# Patient Record
Sex: Female | Born: 1950 | Race: White | Hispanic: No | Marital: Married | Smoking: Never smoker
Health system: Southern US, Community
[De-identification: ages and names within clinical notes are randomized; demographics above are authoritative.]

## PROBLEM LIST (undated history)

## (undated) DIAGNOSIS — I10 Essential (primary) hypertension: Secondary | ICD-10-CM

## (undated) DIAGNOSIS — F329 Major depressive disorder, single episode, unspecified: Secondary | ICD-10-CM

## (undated) DIAGNOSIS — C50919 Malignant neoplasm of unspecified site of unspecified female breast: Secondary | ICD-10-CM

## (undated) DIAGNOSIS — K219 Gastro-esophageal reflux disease without esophagitis: Secondary | ICD-10-CM

## (undated) DIAGNOSIS — K8020 Calculus of gallbladder without cholecystitis without obstruction: Secondary | ICD-10-CM

## (undated) DIAGNOSIS — T7840XA Allergy, unspecified, initial encounter: Secondary | ICD-10-CM

## (undated) DIAGNOSIS — J189 Pneumonia, unspecified organism: Secondary | ICD-10-CM

## (undated) DIAGNOSIS — H269 Unspecified cataract: Secondary | ICD-10-CM

## (undated) DIAGNOSIS — N2889 Other specified disorders of kidney and ureter: Secondary | ICD-10-CM

## (undated) DIAGNOSIS — Z9081 Acquired absence of spleen: Secondary | ICD-10-CM

## (undated) DIAGNOSIS — M1990 Unspecified osteoarthritis, unspecified site: Secondary | ICD-10-CM

## (undated) DIAGNOSIS — Z87442 Personal history of urinary calculi: Secondary | ICD-10-CM

## (undated) DIAGNOSIS — I447 Left bundle-branch block, unspecified: Secondary | ICD-10-CM

## (undated) DIAGNOSIS — R7989 Other specified abnormal findings of blood chemistry: Secondary | ICD-10-CM

## (undated) DIAGNOSIS — F32A Depression, unspecified: Secondary | ICD-10-CM

## (undated) DIAGNOSIS — F419 Anxiety disorder, unspecified: Secondary | ICD-10-CM

## (undated) DIAGNOSIS — N200 Calculus of kidney: Secondary | ICD-10-CM

## (undated) DIAGNOSIS — R079 Chest pain, unspecified: Secondary | ICD-10-CM

## (undated) DIAGNOSIS — E785 Hyperlipidemia, unspecified: Secondary | ICD-10-CM

## (undated) DIAGNOSIS — I509 Heart failure, unspecified: Secondary | ICD-10-CM

## (undated) DIAGNOSIS — E119 Type 2 diabetes mellitus without complications: Secondary | ICD-10-CM

## (undated) HISTORY — DX: Acquired absence of spleen: Z90.81

## (undated) HISTORY — DX: Unspecified osteoarthritis, unspecified site: M19.90

## (undated) HISTORY — DX: Depression, unspecified: F32.A

## (undated) HISTORY — PX: ESOPHAGOGASTRODUODENOSCOPY: SHX1529

## (undated) HISTORY — DX: Essential (primary) hypertension: I10

## (undated) HISTORY — DX: Calculus of kidney: N20.0

## (undated) HISTORY — PX: TUBAL LIGATION: SHX77

## (undated) HISTORY — DX: Pneumonia, unspecified organism: J18.9

## (undated) HISTORY — DX: Hyperlipidemia, unspecified: E78.5

## (undated) HISTORY — PX: PANCREATIC CYST EXCISION: SHX2157

## (undated) HISTORY — DX: Other specified disorders of kidney and ureter: N28.89

## (undated) HISTORY — DX: Major depressive disorder, single episode, unspecified: F32.9

## (undated) HISTORY — DX: Left bundle-branch block, unspecified: I44.7

## (undated) HISTORY — DX: Chest pain, unspecified: R07.9

## (undated) HISTORY — DX: Unspecified cataract: H26.9

## (undated) HISTORY — DX: Type 2 diabetes mellitus without complications: E11.9

## (undated) HISTORY — DX: Malignant neoplasm of unspecified site of unspecified female breast: C50.919

## (undated) HISTORY — DX: Gastro-esophageal reflux disease without esophagitis: K21.9

## (undated) HISTORY — DX: Other specified abnormal findings of blood chemistry: R79.89

## (undated) HISTORY — PX: FRACTURE SURGERY: SHX138

## (undated) HISTORY — PX: SPLENECTOMY: SUR1306

## (undated) HISTORY — PX: APPENDECTOMY: SHX54

## (undated) HISTORY — PX: TONSILLECTOMY AND ADENOIDECTOMY: SUR1326

## (undated) HISTORY — PX: LIVER BIOPSY: SHX301

## (undated) HISTORY — PX: CHOLECYSTECTOMY: SHX55

## (undated) HISTORY — DX: Allergy, unspecified, initial encounter: T78.40XA

## (undated) HISTORY — DX: Calculus of gallbladder without cholecystitis without obstruction: K80.20

## (undated) HISTORY — PX: HERNIA REPAIR: SHX51

---

## 1998-01-24 ENCOUNTER — Other Ambulatory Visit: Admission: RE | Admit: 1998-01-24 | Discharge: 1998-01-24 | Payer: Self-pay | Admitting: *Deleted

## 1999-02-02 ENCOUNTER — Other Ambulatory Visit: Admission: RE | Admit: 1999-02-02 | Discharge: 1999-02-02 | Payer: Self-pay | Admitting: *Deleted

## 2000-01-02 ENCOUNTER — Other Ambulatory Visit: Admission: RE | Admit: 2000-01-02 | Discharge: 2000-01-02 | Payer: Self-pay | Admitting: *Deleted

## 2002-03-02 ENCOUNTER — Other Ambulatory Visit: Admission: RE | Admit: 2002-03-02 | Discharge: 2002-03-02 | Payer: Self-pay | Admitting: Family Medicine

## 2002-03-09 ENCOUNTER — Encounter: Payer: Self-pay | Admitting: Family Medicine

## 2002-03-09 ENCOUNTER — Ambulatory Visit (HOSPITAL_COMMUNITY): Admission: RE | Admit: 2002-03-09 | Discharge: 2002-03-09 | Payer: Self-pay | Admitting: Family Medicine

## 2002-03-23 ENCOUNTER — Ambulatory Visit (HOSPITAL_COMMUNITY): Admission: RE | Admit: 2002-03-23 | Discharge: 2002-03-23 | Payer: Self-pay | Admitting: Gastroenterology

## 2002-03-30 ENCOUNTER — Encounter: Admission: RE | Admit: 2002-03-30 | Discharge: 2002-03-30 | Payer: Self-pay | Admitting: Family Medicine

## 2002-03-30 ENCOUNTER — Encounter: Payer: Self-pay | Admitting: Family Medicine

## 2002-04-13 ENCOUNTER — Encounter: Payer: Self-pay | Admitting: Gastroenterology

## 2002-04-13 ENCOUNTER — Ambulatory Visit (HOSPITAL_COMMUNITY): Admission: RE | Admit: 2002-04-13 | Discharge: 2002-04-13 | Payer: Self-pay | Admitting: Gastroenterology

## 2003-07-27 ENCOUNTER — Other Ambulatory Visit: Admission: RE | Admit: 2003-07-27 | Discharge: 2003-07-27 | Payer: Self-pay | Admitting: Family Medicine

## 2004-01-18 ENCOUNTER — Encounter: Admission: RE | Admit: 2004-01-18 | Discharge: 2004-01-18 | Payer: Self-pay | Admitting: Internal Medicine

## 2004-07-18 ENCOUNTER — Other Ambulatory Visit: Admission: RE | Admit: 2004-07-18 | Discharge: 2004-07-18 | Payer: Self-pay | Admitting: Obstetrics & Gynecology

## 2004-07-25 ENCOUNTER — Ambulatory Visit: Payer: Self-pay | Admitting: Family Medicine

## 2004-12-02 ENCOUNTER — Emergency Department (HOSPITAL_COMMUNITY): Admission: EM | Admit: 2004-12-02 | Discharge: 2004-12-02 | Payer: Self-pay | Admitting: Emergency Medicine

## 2004-12-07 ENCOUNTER — Ambulatory Visit: Payer: Self-pay | Admitting: Family Medicine

## 2004-12-15 ENCOUNTER — Ambulatory Visit: Payer: Self-pay | Admitting: Family Medicine

## 2004-12-21 ENCOUNTER — Ambulatory Visit: Payer: Self-pay

## 2005-01-01 ENCOUNTER — Ambulatory Visit: Payer: Self-pay | Admitting: Family Medicine

## 2005-01-05 ENCOUNTER — Ambulatory Visit: Payer: Self-pay | Admitting: Cardiology

## 2005-04-13 ENCOUNTER — Ambulatory Visit: Payer: Self-pay | Admitting: Family Medicine

## 2005-04-25 DIAGNOSIS — K8020 Calculus of gallbladder without cholecystitis without obstruction: Secondary | ICD-10-CM

## 2005-04-25 HISTORY — DX: Calculus of gallbladder without cholecystitis without obstruction: K80.20

## 2005-05-20 ENCOUNTER — Ambulatory Visit: Payer: Self-pay | Admitting: Internal Medicine

## 2005-05-21 ENCOUNTER — Inpatient Hospital Stay (HOSPITAL_COMMUNITY): Admission: EM | Admit: 2005-05-21 | Discharge: 2005-05-24 | Payer: Self-pay | Admitting: Emergency Medicine

## 2005-05-21 ENCOUNTER — Ambulatory Visit: Payer: Self-pay | Admitting: Gastroenterology

## 2005-05-23 ENCOUNTER — Encounter (INDEPENDENT_AMBULATORY_CARE_PROVIDER_SITE_OTHER): Payer: Self-pay | Admitting: Specialist

## 2005-06-06 ENCOUNTER — Ambulatory Visit: Payer: Self-pay | Admitting: Family Medicine

## 2005-07-05 ENCOUNTER — Ambulatory Visit: Payer: Self-pay | Admitting: Gastroenterology

## 2005-11-15 ENCOUNTER — Ambulatory Visit: Payer: Self-pay | Admitting: Family Medicine

## 2005-11-23 HISTORY — PX: KNEE ARTHROSCOPY: SUR90

## 2005-11-28 ENCOUNTER — Encounter: Admission: RE | Admit: 2005-11-28 | Discharge: 2005-11-28 | Payer: Self-pay | Admitting: Family Medicine

## 2005-12-06 ENCOUNTER — Ambulatory Visit: Payer: Self-pay | Admitting: Family Medicine

## 2006-01-31 ENCOUNTER — Ambulatory Visit: Payer: Self-pay | Admitting: Family Medicine

## 2006-07-04 ENCOUNTER — Ambulatory Visit: Payer: Self-pay | Admitting: Family Medicine

## 2006-07-17 ENCOUNTER — Ambulatory Visit: Payer: Self-pay | Admitting: Family Medicine

## 2006-07-17 LAB — CONVERTED CEMR LAB
ALT: 23 units/L (ref 0–40)
Albumin: 3.8 g/dL (ref 3.5–5.2)
Alkaline Phosphatase: 80 units/L (ref 39–117)
Creatinine,U: 148.2 mg/dL
GFR calc Af Amer: 83 mL/min
GFR calc non Af Amer: 69 mL/min
Hgb A1c MFr Bld: 6.6 % — ABNORMAL HIGH (ref 4.6–6.0)
Microalb Creat Ratio: 8.8 mg/g (ref 0.0–30.0)
Microalb, Ur: 1.3 mg/dL (ref 0.0–1.9)
Potassium: 3.9 meq/L (ref 3.5–5.1)
Sodium: 142 meq/L (ref 135–145)
Total Protein: 6.9 g/dL (ref 6.0–8.3)

## 2006-10-14 ENCOUNTER — Ambulatory Visit: Payer: Self-pay | Admitting: Family Medicine

## 2006-10-14 LAB — CONVERTED CEMR LAB
AST: 20 units/L (ref 0–37)
Bilirubin, Direct: 0.1 mg/dL (ref 0.0–0.3)
CO2: 26 meq/L (ref 19–32)
Calcium: 8.8 mg/dL (ref 8.4–10.5)
Creatinine, Ser: 0.7 mg/dL (ref 0.4–1.2)
Hgb A1c MFr Bld: 6.5 % — ABNORMAL HIGH (ref 4.6–6.0)
Sodium: 144 meq/L (ref 135–145)
Total Bilirubin: 0.6 mg/dL (ref 0.3–1.2)
Total Protein: 6.3 g/dL (ref 6.0–8.3)

## 2006-11-04 ENCOUNTER — Ambulatory Visit: Payer: Self-pay | Admitting: Family Medicine

## 2007-01-16 ENCOUNTER — Ambulatory Visit: Payer: Self-pay | Admitting: Family Medicine

## 2007-01-17 LAB — CONVERTED CEMR LAB
ALT: 26 units/L (ref 0–35)
Alkaline Phosphatase: 96 units/L (ref 39–117)
BUN: 16 mg/dL (ref 6–23)
Bilirubin, Direct: 0.1 mg/dL (ref 0.0–0.3)
CO2: 27 meq/L (ref 19–32)
Calcium: 9.4 mg/dL (ref 8.4–10.5)
Creatinine, Ser: 0.8 mg/dL (ref 0.4–1.2)
Creatinine,U: 158.6 mg/dL
GFR calc non Af Amer: 79 mL/min
Glucose, Bld: 134 mg/dL — ABNORMAL HIGH (ref 70–99)
Potassium: 3.6 meq/L (ref 3.5–5.1)
Total Protein: 6.8 g/dL (ref 6.0–8.3)

## 2007-01-20 ENCOUNTER — Encounter: Payer: Self-pay | Admitting: Family Medicine

## 2007-01-26 ENCOUNTER — Encounter: Payer: Self-pay | Admitting: Family Medicine

## 2007-05-02 ENCOUNTER — Telehealth (INDEPENDENT_AMBULATORY_CARE_PROVIDER_SITE_OTHER): Payer: Self-pay | Admitting: *Deleted

## 2007-05-07 ENCOUNTER — Ambulatory Visit: Payer: Self-pay | Admitting: Family Medicine

## 2007-05-08 LAB — CONVERTED CEMR LAB
ALT: 22 units/L (ref 0–35)
AST: 19 units/L (ref 0–37)
Albumin: 3.7 g/dL (ref 3.5–5.2)
Alkaline Phosphatase: 85 units/L (ref 39–117)
Bilirubin, Direct: 0.1 mg/dL (ref 0.0–0.3)
Total Bilirubin: 0.6 mg/dL (ref 0.3–1.2)
Total Protein: 7 g/dL (ref 6.0–8.3)

## 2007-06-21 ENCOUNTER — Encounter: Payer: Self-pay | Admitting: Family Medicine

## 2007-07-21 ENCOUNTER — Ambulatory Visit: Payer: Self-pay | Admitting: Family Medicine

## 2007-07-29 LAB — CONVERTED CEMR LAB
CO2: 28 meq/L (ref 19–32)
Calcium: 9.3 mg/dL (ref 8.4–10.5)
Chloride: 107 meq/L (ref 96–112)
Creatinine, Ser: 0.8 mg/dL (ref 0.4–1.2)
GFR calc non Af Amer: 79 mL/min
Hgb A1c MFr Bld: 6.8 % — ABNORMAL HIGH (ref 4.6–6.0)
Potassium: 3.9 meq/L (ref 3.5–5.1)
Sodium: 143 meq/L (ref 135–145)

## 2007-09-04 ENCOUNTER — Ambulatory Visit: Payer: Self-pay | Admitting: Family Medicine

## 2007-09-04 DIAGNOSIS — R209 Unspecified disturbances of skin sensation: Secondary | ICD-10-CM | POA: Insufficient documentation

## 2007-09-04 DIAGNOSIS — F329 Major depressive disorder, single episode, unspecified: Secondary | ICD-10-CM | POA: Insufficient documentation

## 2007-09-04 DIAGNOSIS — K219 Gastro-esophageal reflux disease without esophagitis: Secondary | ICD-10-CM | POA: Insufficient documentation

## 2007-09-04 DIAGNOSIS — Z9189 Other specified personal risk factors, not elsewhere classified: Secondary | ICD-10-CM | POA: Insufficient documentation

## 2007-09-04 DIAGNOSIS — E785 Hyperlipidemia, unspecified: Secondary | ICD-10-CM | POA: Insufficient documentation

## 2007-09-08 ENCOUNTER — Encounter (INDEPENDENT_AMBULATORY_CARE_PROVIDER_SITE_OTHER): Payer: Self-pay | Admitting: *Deleted

## 2007-09-10 ENCOUNTER — Telehealth (INDEPENDENT_AMBULATORY_CARE_PROVIDER_SITE_OTHER): Payer: Self-pay | Admitting: *Deleted

## 2007-09-11 ENCOUNTER — Encounter: Payer: Self-pay | Admitting: Family Medicine

## 2007-10-23 ENCOUNTER — Encounter: Admission: RE | Admit: 2007-10-23 | Discharge: 2007-10-23 | Payer: Self-pay | Admitting: *Deleted

## 2008-04-02 ENCOUNTER — Encounter: Payer: Self-pay | Admitting: Internal Medicine

## 2008-05-26 ENCOUNTER — Ambulatory Visit: Payer: Self-pay | Admitting: Internal Medicine

## 2008-06-23 ENCOUNTER — Emergency Department (HOSPITAL_COMMUNITY): Admission: EM | Admit: 2008-06-23 | Discharge: 2008-06-23 | Payer: Self-pay | Admitting: Emergency Medicine

## 2008-06-30 ENCOUNTER — Encounter: Payer: Self-pay | Admitting: Internal Medicine

## 2008-07-12 ENCOUNTER — Ambulatory Visit: Payer: Self-pay | Admitting: Internal Medicine

## 2008-07-12 DIAGNOSIS — IMO0002 Reserved for concepts with insufficient information to code with codable children: Secondary | ICD-10-CM

## 2008-07-12 DIAGNOSIS — M5416 Radiculopathy, lumbar region: Secondary | ICD-10-CM | POA: Insufficient documentation

## 2008-07-12 LAB — CONVERTED CEMR LAB
Alkaline Phosphatase: 81 units/L (ref 39–117)
BUN: 15 mg/dL (ref 6–23)
Basophils Absolute: 0.1 10*3/uL (ref 0.0–0.1)
Bilirubin, Direct: 0.1 mg/dL (ref 0.0–0.3)
Calcium: 9.3 mg/dL (ref 8.4–10.5)
Cholesterol: 89 mg/dL (ref 0–200)
Creatinine,U: 140 mg/dL
Crystals: NEGATIVE
Eosinophils Absolute: 0.2 10*3/uL (ref 0.0–0.7)
Eosinophils Relative: 1.6 % (ref 0.0–5.0)
GFR calc Af Amer: 111 mL/min
GFR calc non Af Amer: 91 mL/min
Hemoglobin: 12.4 g/dL (ref 12.0–15.0)
Leukocytes, UA: NEGATIVE
MCHC: 33.7 g/dL (ref 30.0–36.0)
Neutrophils Relative %: 58.1 % (ref 43.0–77.0)
Platelets: 239 10*3/uL (ref 150–400)
RBC: 4.28 M/uL (ref 3.87–5.11)
RDW: 12.3 % (ref 11.5–14.6)
TSH: 1.43 microintl units/mL (ref 0.35–5.50)
Total Bilirubin: 0.7 mg/dL (ref 0.3–1.2)
Total CHOL/HDL Ratio: 2.5
Total Protein, Urine: NEGATIVE mg/dL
Triglycerides: 63 mg/dL (ref 0–149)
Vitamin B-12: 417 pg/mL (ref 211–911)
pH: 6.5 (ref 5.0–8.0)

## 2008-07-13 ENCOUNTER — Encounter: Payer: Self-pay | Admitting: Internal Medicine

## 2008-07-16 ENCOUNTER — Encounter: Admission: RE | Admit: 2008-07-16 | Discharge: 2008-07-16 | Payer: Self-pay | Admitting: Internal Medicine

## 2008-07-18 ENCOUNTER — Encounter: Payer: Self-pay | Admitting: Internal Medicine

## 2008-07-28 ENCOUNTER — Encounter: Payer: Self-pay | Admitting: Internal Medicine

## 2008-08-04 ENCOUNTER — Encounter: Admission: RE | Admit: 2008-08-04 | Discharge: 2008-08-04 | Payer: Self-pay | Admitting: Urology

## 2008-08-12 ENCOUNTER — Telehealth: Payer: Self-pay | Admitting: Internal Medicine

## 2008-08-18 ENCOUNTER — Encounter: Payer: Self-pay | Admitting: Internal Medicine

## 2008-08-24 ENCOUNTER — Encounter: Payer: Self-pay | Admitting: Internal Medicine

## 2008-08-31 ENCOUNTER — Encounter: Payer: Self-pay | Admitting: Internal Medicine

## 2008-09-03 ENCOUNTER — Encounter: Payer: Self-pay | Admitting: Internal Medicine

## 2008-09-10 ENCOUNTER — Encounter: Payer: Self-pay | Admitting: Internal Medicine

## 2008-09-15 ENCOUNTER — Ambulatory Visit: Payer: Self-pay | Admitting: Internal Medicine

## 2008-09-18 ENCOUNTER — Telehealth (INDEPENDENT_AMBULATORY_CARE_PROVIDER_SITE_OTHER): Payer: Self-pay | Admitting: *Deleted

## 2008-09-20 ENCOUNTER — Telehealth: Payer: Self-pay | Admitting: Internal Medicine

## 2008-10-18 ENCOUNTER — Ambulatory Visit: Payer: Self-pay | Admitting: Internal Medicine

## 2008-10-19 ENCOUNTER — Encounter: Payer: Self-pay | Admitting: Internal Medicine

## 2008-10-20 ENCOUNTER — Ambulatory Visit: Payer: Self-pay | Admitting: Internal Medicine

## 2008-11-15 ENCOUNTER — Ambulatory Visit: Payer: Self-pay | Admitting: Internal Medicine

## 2008-11-15 LAB — CONVERTED CEMR LAB
AST: 31 units/L (ref 0–37)
Albumin: 4.4 g/dL (ref 3.5–5.2)
BUN: 11 mg/dL (ref 6–23)
Basophils Relative: 0.4 % (ref 0.0–3.0)
Bilirubin, Direct: 0.1 mg/dL (ref 0.0–0.3)
Calcium: 9.9 mg/dL (ref 8.4–10.5)
Cholesterol: 97 mg/dL (ref 0–200)
Eosinophils Absolute: 0.5 10*3/uL (ref 0.0–0.7)
Eosinophils Relative: 3.4 % (ref 0.0–5.0)
GFR calc non Af Amer: 91.2 mL/min (ref >60)
HCT: 36.8 % (ref 36.0–46.0)
Iron: 49 ug/dL (ref 42–145)
Ketones, ur: NEGATIVE mg/dL
LDL Cholesterol: 37 mg/dL (ref 0–99)
MCHC: 33.9 g/dL (ref 30.0–36.0)
Monocytes Absolute: 1.2 10*3/uL — ABNORMAL HIGH (ref 0.1–1.0)
Monocytes Relative: 8.7 % (ref 3.0–12.0)
Neutro Abs: 7.2 10*3/uL (ref 1.4–7.7)
Neutrophils Relative %: 50.7 % (ref 43.0–77.0)
Platelets: 437 10*3/uL — ABNORMAL HIGH (ref 150.0–400.0)
Total CK: 28 units/L (ref 7–177)
Total Protein: 7.4 g/dL (ref 6.0–8.3)
Triglycerides: 126 mg/dL (ref 0.0–149.0)

## 2008-11-16 ENCOUNTER — Encounter: Payer: Self-pay | Admitting: Internal Medicine

## 2008-12-20 ENCOUNTER — Ambulatory Visit: Payer: Self-pay | Admitting: Internal Medicine

## 2008-12-20 DIAGNOSIS — D72829 Elevated white blood cell count, unspecified: Secondary | ICD-10-CM

## 2008-12-20 LAB — CONVERTED CEMR LAB
Alpha-1-Globulin: 4.2 % (ref 2.9–4.9)
Alpha-2-Globulin: 12.1 % — ABNORMAL HIGH (ref 7.1–11.8)
Basophils Relative: 0.6 % (ref 0.0–3.0)
Beta Globulin: 7.3 % — ABNORMAL HIGH (ref 4.7–7.2)
Eosinophils Relative: 3.3 % (ref 0.0–5.0)
Gamma Globulin: 11.7 % (ref 11.1–18.8)
HCT: 38 % (ref 36.0–46.0)
Hemoglobin: 12.5 g/dL (ref 12.0–15.0)
Lymphs Abs: 4.5 10*3/uL — ABNORMAL HIGH (ref 0.7–4.0)
MCHC: 33 g/dL (ref 30.0–36.0)
Monocytes Relative: 10.4 % (ref 3.0–12.0)
Neutro Abs: 8.5 10*3/uL — ABNORMAL HIGH (ref 1.4–7.7)
Platelets: 479 10*3/uL — ABNORMAL HIGH (ref 150.0–400.0)
RBC: 4.47 M/uL (ref 3.87–5.11)
RDW: 13.7 % (ref 11.5–14.6)

## 2008-12-22 ENCOUNTER — Encounter: Payer: Self-pay | Admitting: Internal Medicine

## 2009-02-21 ENCOUNTER — Telehealth: Payer: Self-pay | Admitting: Internal Medicine

## 2009-03-14 ENCOUNTER — Ambulatory Visit: Payer: Self-pay | Admitting: Internal Medicine

## 2009-03-14 LAB — CONVERTED CEMR LAB
Basophils Absolute: 0.1 10*3/uL (ref 0.0–0.1)
Bilirubin Urine: NEGATIVE
Calcium: 9.5 mg/dL (ref 8.4–10.5)
Cholesterol: 94 mg/dL (ref 0–200)
Eosinophils Absolute: 0.5 10*3/uL (ref 0.0–0.7)
GFR calc non Af Amer: 78.08 mL/min (ref >60)
HCT: 37.2 % (ref 36.0–46.0)
HDL: 33.2 mg/dL — ABNORMAL LOW (ref >39.00)
Hemoglobin, Urine: NEGATIVE
LDL Cholesterol: 37 mg/dL (ref 0–99)
Lymphocytes Relative: 32.5 % (ref 12.0–46.0)
Monocytes Absolute: 1.6 10*3/uL — ABNORMAL HIGH (ref 0.1–1.0)
Monocytes Relative: 9.6 % (ref 3.0–12.0)
Neutro Abs: 8.9 10*3/uL — ABNORMAL HIGH (ref 1.4–7.7)
Neutrophils Relative %: 53.9 % (ref 43.0–77.0)
Nitrite: NEGATIVE
Platelets: 421 10*3/uL — ABNORMAL HIGH (ref 150.0–400.0)
RBC: 4.26 M/uL (ref 3.87–5.11)
RDW: 13.4 % (ref 11.5–14.6)
Sodium: 141 meq/L (ref 135–145)
TSH: 1.85 microintl units/mL (ref 0.35–5.50)
Urobilinogen, UA: 0.2 (ref 0.0–1.0)
WBC: 16.4 10*3/uL — ABNORMAL HIGH (ref 4.5–10.5)
pH: 5.5 (ref 5.0–8.0)

## 2009-03-15 ENCOUNTER — Encounter: Payer: Self-pay | Admitting: Internal Medicine

## 2009-04-13 ENCOUNTER — Encounter: Payer: Self-pay | Admitting: Internal Medicine

## 2009-04-13 ENCOUNTER — Ambulatory Visit: Payer: Self-pay | Admitting: Internal Medicine

## 2009-04-13 LAB — CONVERTED CEMR LAB
Albumin ELP: 58.9 % (ref 55.8–66.1)
Basophils Absolute: 0.1 10*3/uL (ref 0.0–0.1)
Gamma Globulin: 11.8 % (ref 11.1–18.8)
HCT: 40 % (ref 36.0–46.0)
Lymphs Abs: 5.4 10*3/uL — ABNORMAL HIGH (ref 0.7–4.0)
MCV: 86.9 fL (ref 78.0–100.0)
Monocytes Absolute: 1.4 10*3/uL — ABNORMAL HIGH (ref 0.1–1.0)
Neutro Abs: 11.5 10*3/uL — ABNORMAL HIGH (ref 1.4–7.7)
Platelets: 434 10*3/uL — ABNORMAL HIGH (ref 150.0–400.0)

## 2009-04-14 ENCOUNTER — Telehealth: Payer: Self-pay | Admitting: Internal Medicine

## 2009-05-12 ENCOUNTER — Encounter: Admission: RE | Admit: 2009-05-12 | Discharge: 2009-06-22 | Payer: Self-pay | Admitting: Internal Medicine

## 2009-06-07 ENCOUNTER — Ambulatory Visit: Payer: Self-pay | Admitting: Internal Medicine

## 2009-06-07 DIAGNOSIS — K863 Pseudocyst of pancreas: Secondary | ICD-10-CM

## 2009-06-07 DIAGNOSIS — K862 Cyst of pancreas: Secondary | ICD-10-CM | POA: Insufficient documentation

## 2009-06-07 LAB — CONVERTED CEMR LAB
Basophils Absolute: 0.1 10*3/uL (ref 0.0–0.1)
Basophils Relative: 0.5 % (ref 0.0–3.0)
CO2: 28 meq/L (ref 19–32)
Creatinine, Ser: 0.7 mg/dL (ref 0.4–1.2)
GFR calc non Af Amer: 91.02 mL/min (ref >60)
HCT: 38.7 % (ref 36.0–46.0)
Lymphocytes Relative: 29.2 % (ref 12.0–46.0)
MCHC: 32.9 g/dL (ref 30.0–36.0)
MCV: 89.5 fL (ref 78.0–100.0)
Monocytes Absolute: 1.5 10*3/uL — ABNORMAL HIGH (ref 0.1–1.0)
Neutro Abs: 8.8 10*3/uL — ABNORMAL HIGH (ref 1.4–7.7)
Platelets: 412 10*3/uL — ABNORMAL HIGH (ref 150.0–400.0)
Potassium: 3.9 meq/L (ref 3.5–5.1)
WBC: 15.1 10*3/uL — ABNORMAL HIGH (ref 4.5–10.5)

## 2009-06-08 ENCOUNTER — Encounter: Payer: Self-pay | Admitting: Internal Medicine

## 2009-06-10 ENCOUNTER — Ambulatory Visit: Payer: Self-pay | Admitting: Oncology

## 2009-06-23 ENCOUNTER — Encounter: Payer: Self-pay | Admitting: Internal Medicine

## 2009-06-23 ENCOUNTER — Other Ambulatory Visit: Admission: RE | Admit: 2009-06-23 | Discharge: 2009-06-23 | Payer: Self-pay | Admitting: Oncology

## 2009-06-23 LAB — COMPREHENSIVE METABOLIC PANEL
Alkaline Phosphatase: 68 U/L (ref 39–117)
BUN: 21 mg/dL (ref 6–23)
Glucose, Bld: 116 mg/dL — ABNORMAL HIGH (ref 70–99)
Sodium: 142 mEq/L (ref 135–145)
Total Bilirubin: 0.2 mg/dL — ABNORMAL LOW (ref 0.3–1.2)
Total Protein: 6.9 g/dL (ref 6.0–8.3)

## 2009-06-23 LAB — CBC WITH DIFFERENTIAL/PLATELET
BASO%: 0.4 % (ref 0.0–2.0)
Basophils Absolute: 0.1 10*3/uL (ref 0.0–0.1)
EOS%: 2 % (ref 0.0–7.0)
HGB: 11.8 g/dL (ref 11.6–15.9)
MCH: 28 pg (ref 25.1–34.0)
MCHC: 32.1 g/dL (ref 31.5–36.0)
MCV: 87.4 fL (ref 79.5–101.0)
MONO%: 7 % (ref 0.0–14.0)
RDW: 14.8 % — ABNORMAL HIGH (ref 11.2–14.5)

## 2009-06-23 LAB — CHCC SMEAR

## 2009-06-23 LAB — MORPHOLOGY: PLT EST: INCREASED

## 2009-06-23 LAB — URIC ACID: Uric Acid, Serum: 4.4 mg/dL (ref 2.4–7.0)

## 2009-06-29 LAB — FLOW CYTOMETRY

## 2009-07-05 LAB — BCR/ABL

## 2009-07-14 ENCOUNTER — Encounter: Payer: Self-pay | Admitting: Internal Medicine

## 2009-07-25 ENCOUNTER — Telehealth: Payer: Self-pay | Admitting: Internal Medicine

## 2009-08-08 ENCOUNTER — Ambulatory Visit: Payer: Self-pay | Admitting: Oncology

## 2009-08-10 LAB — CBC WITH DIFFERENTIAL/PLATELET
BASO%: 0.3 % (ref 0.0–2.0)
Eosinophils Absolute: 0.2 10*3/uL (ref 0.0–0.5)
MCHC: 33.4 g/dL (ref 31.5–36.0)
MONO#: 1.7 10*3/uL — ABNORMAL HIGH (ref 0.1–0.9)
NEUT#: 7.1 10*3/uL — ABNORMAL HIGH (ref 1.5–6.5)
Platelets: 462 10*3/uL — ABNORMAL HIGH (ref 145–400)
RBC: 4.17 10*6/uL (ref 3.70–5.45)
WBC: 14.9 10*3/uL — ABNORMAL HIGH (ref 3.9–10.3)
lymph#: 5.8 10*3/uL — ABNORMAL HIGH (ref 0.9–3.3)

## 2009-08-10 LAB — CHCC SMEAR

## 2009-08-10 LAB — MORPHOLOGY
PLT EST: INCREASED
RBC Comments: NORMAL

## 2009-09-19 ENCOUNTER — Ambulatory Visit: Payer: Self-pay | Admitting: Oncology

## 2009-09-21 ENCOUNTER — Ambulatory Visit (HOSPITAL_COMMUNITY): Admission: RE | Admit: 2009-09-21 | Discharge: 2009-09-21 | Payer: Self-pay | Admitting: Oncology

## 2009-09-21 LAB — CBC WITH DIFFERENTIAL/PLATELET
Eosinophils Absolute: 0.2 10*3/uL (ref 0.0–0.5)
MONO#: 1.6 10*3/uL — ABNORMAL HIGH (ref 0.1–0.9)
NEUT#: 9.4 10*3/uL — ABNORMAL HIGH (ref 1.5–6.5)
Platelets: 433 10*3/uL — ABNORMAL HIGH (ref 145–400)
RBC: 4.17 10*6/uL (ref 3.70–5.45)
RDW: 13.4 % (ref 11.2–14.5)
WBC: 17.7 10*3/uL — ABNORMAL HIGH (ref 3.9–10.3)

## 2009-09-21 LAB — MORPHOLOGY: PLT EST: INCREASED

## 2009-09-21 LAB — CHCC SMEAR

## 2009-09-28 LAB — JAK2 EXONS 12-15

## 2009-10-04 ENCOUNTER — Ambulatory Visit: Payer: Self-pay | Admitting: Internal Medicine

## 2009-10-04 LAB — CONVERTED CEMR LAB
AST: 19 units/L (ref 0–37)
BUN: 18 mg/dL (ref 6–23)
CO2: 30 meq/L (ref 19–32)
Cholesterol: 105 mg/dL (ref 0–200)
Glucose, Bld: 148 mg/dL — ABNORMAL HIGH (ref 70–99)
HDL: 50.8 mg/dL (ref >39.00)
Hgb A1c MFr Bld: 7.5 % — ABNORMAL HIGH (ref 4.6–6.5)
TSH: 1.31 microintl units/mL (ref 0.35–5.50)
Total Bilirubin: 0.4 mg/dL (ref 0.3–1.2)
Total CHOL/HDL Ratio: 2
Total Protein: 7.4 g/dL (ref 6.0–8.3)
Triglycerides: 131 mg/dL (ref 0.0–149.0)
VLDL: 26.2 mg/dL (ref 0.0–40.0)

## 2009-10-05 ENCOUNTER — Encounter: Payer: Self-pay | Admitting: Internal Medicine

## 2009-10-18 ENCOUNTER — Encounter: Payer: Self-pay | Admitting: Internal Medicine

## 2009-12-01 ENCOUNTER — Ambulatory Visit: Payer: Self-pay | Admitting: Oncology

## 2009-12-05 LAB — CBC WITH DIFFERENTIAL/PLATELET
Basophils Absolute: 0.1 10*3/uL (ref 0.0–0.1)
EOS%: 2.2 % (ref 0.0–7.0)
HGB: 11.8 g/dL (ref 11.6–15.9)
MCH: 29.8 pg (ref 25.1–34.0)
NEUT#: 7.2 10*3/uL — ABNORMAL HIGH (ref 1.5–6.5)
RDW: 13.6 % (ref 11.2–14.5)
lymph#: 6.5 10*3/uL — ABNORMAL HIGH (ref 0.9–3.3)

## 2009-12-08 LAB — CONVERTED CEMR LAB: Pap Smear: NORMAL

## 2009-12-12 LAB — HM MAMMOGRAPHY: HM Mammogram: NORMAL

## 2010-01-02 ENCOUNTER — Ambulatory Visit: Payer: Self-pay | Admitting: Internal Medicine

## 2010-01-02 DIAGNOSIS — K439 Ventral hernia without obstruction or gangrene: Secondary | ICD-10-CM | POA: Insufficient documentation

## 2010-01-09 ENCOUNTER — Ambulatory Visit: Payer: Self-pay | Admitting: Cardiology

## 2010-01-09 ENCOUNTER — Ambulatory Visit: Payer: Self-pay | Admitting: Internal Medicine

## 2010-01-09 LAB — CONVERTED CEMR LAB: BUN: 21 mg/dL (ref 6–23)

## 2010-02-02 ENCOUNTER — Ambulatory Visit: Payer: Self-pay | Admitting: Internal Medicine

## 2010-02-02 LAB — CONVERTED CEMR LAB
Alkaline Phosphatase: 85 units/L (ref 39–117)
Basophils Absolute: 0 10*3/uL (ref 0.0–0.1)
Basophils Relative: 0.3 % (ref 0.0–3.0)
Creatinine, Ser: 0.7 mg/dL (ref 0.4–1.2)
GFR calc non Af Amer: 87.91 mL/min (ref >60)
Glucose, Bld: 229 mg/dL — ABNORMAL HIGH (ref 70–99)
Hemoglobin: 12.7 g/dL (ref 12.0–15.0)
Hgb A1c MFr Bld: 9.8 % — ABNORMAL HIGH (ref 4.6–6.5)
Lymphocytes Relative: 27.6 % (ref 12.0–46.0)
Neutrophils Relative %: 62.5 % (ref 43.0–77.0)
Potassium: 4.8 meq/L (ref 3.5–5.1)
RBC: 4.3 M/uL (ref 3.87–5.11)
RDW: 13.8 % (ref 11.5–14.6)
Total Protein: 7.1 g/dL (ref 6.0–8.3)
WBC: 15.3 10*3/uL — ABNORMAL HIGH (ref 4.5–10.5)

## 2010-03-08 ENCOUNTER — Ambulatory Visit: Payer: Self-pay | Admitting: Oncology

## 2010-03-13 ENCOUNTER — Encounter: Payer: Self-pay | Admitting: Internal Medicine

## 2010-03-13 LAB — COMPREHENSIVE METABOLIC PANEL
AST: 20 U/L (ref 0–37)
Albumin: 4.5 g/dL (ref 3.5–5.2)
BUN: 12 mg/dL (ref 6–23)
CO2: 23 mEq/L (ref 19–32)
Calcium: 10.2 mg/dL (ref 8.4–10.5)
Chloride: 100 mEq/L (ref 96–112)
Glucose, Bld: 260 mg/dL — ABNORMAL HIGH (ref 70–99)
Potassium: 4 mEq/L (ref 3.5–5.3)

## 2010-03-13 LAB — CBC WITH DIFFERENTIAL/PLATELET
Basophils Absolute: 0.1 10*3/uL (ref 0.0–0.1)
Eosinophils Absolute: 0.2 10*3/uL (ref 0.0–0.5)
HGB: 12.3 g/dL (ref 11.6–15.9)
MONO#: 1.3 10*3/uL — ABNORMAL HIGH (ref 0.1–0.9)
NEUT#: 7.2 10*3/uL — ABNORMAL HIGH (ref 1.5–6.5)
RDW: 13.4 % (ref 11.2–14.5)
lymph#: 5.1 10*3/uL — ABNORMAL HIGH (ref 0.9–3.3)

## 2010-04-18 ENCOUNTER — Encounter: Payer: Self-pay | Admitting: Internal Medicine

## 2010-05-26 ENCOUNTER — Ambulatory Visit: Payer: Self-pay | Admitting: Internal Medicine

## 2010-05-26 LAB — CONVERTED CEMR LAB
ALT: 27 units/L (ref 0–35)
AST: 24 units/L (ref 0–37)
Albumin: 4.2 g/dL (ref 3.5–5.2)
BUN: 19 mg/dL (ref 6–23)
Basophils Absolute: 0.1 10*3/uL (ref 0.0–0.1)
CO2: 28 meq/L (ref 19–32)
Calcium: 9.8 mg/dL (ref 8.4–10.5)
Chloride: 100 meq/L (ref 96–112)
Creatinine, Ser: 0.7 mg/dL (ref 0.4–1.2)
Eosinophils Absolute: 0.2 10*3/uL (ref 0.0–0.7)
Eosinophils Relative: 1.4 % (ref 0.0–5.0)
Glucose, Bld: 246 mg/dL — ABNORMAL HIGH (ref 70–99)
HCT: 38.6 % (ref 36.0–46.0)
Monocytes Absolute: 1.4 10*3/uL — ABNORMAL HIGH (ref 0.1–1.0)
Monocytes Relative: 9.8 % (ref 3.0–12.0)
Neutro Abs: 8.2 10*3/uL — ABNORMAL HIGH (ref 1.4–7.7)
Potassium: 4.2 meq/L (ref 3.5–5.1)
Sodium: 139 meq/L (ref 135–145)
Total Bilirubin: 0.6 mg/dL (ref 0.3–1.2)
Total Protein: 6.9 g/dL (ref 6.0–8.3)
Urine Glucose: 500 mg/dL
Urobilinogen, UA: 0.2 (ref 0.0–1.0)

## 2010-06-01 ENCOUNTER — Encounter: Payer: Self-pay | Admitting: Internal Medicine

## 2010-06-22 ENCOUNTER — Telehealth: Payer: Self-pay | Admitting: Internal Medicine

## 2010-07-15 ENCOUNTER — Encounter: Payer: Self-pay | Admitting: Gastroenterology

## 2010-07-16 ENCOUNTER — Encounter: Payer: Self-pay | Admitting: Urology

## 2010-07-17 ENCOUNTER — Encounter: Payer: Self-pay | Admitting: Internal Medicine

## 2010-07-25 NOTE — Letter (Signed)
Summary: Results Follow-up Letter  [facility]  [address] [place], [place] [address]   Phone: [phone]  Fax: [phone]    02/02/2010  [address] [place], [place]  [address]  Dear Ms. Eskin,   The following are the results of your recent test(s):  Test     Result     Blood sugar     way too high A1C=9.8     too high CBC       slightly elevated WBC Liver/kidney   normal Thyroid     normal  _________________________________________________________  Please call for an appointment soon _________________________________________________________ _________________________________________________________ _________________________________________________________  Sincerely,  Sanda Linger MD [facility]

## 2010-07-25 NOTE — Assessment & Plan Note (Signed)
Summary: 4 mo rov /nws  #   Vital Signs:  Patient profile:   60 year old female Height:      63 inches Weight:      189 pounds BMI:     33.60 O2 Sat:      98 % on Room air Temp:     97.8 degrees F oral Pulse rate:   96 / minute Pulse rhythm:   regular Resp:     16 per minute BP sitting:   120 / 78  (left arm) Cuff size:   large  Vitals Entered By: Rock Nephew CMA (October 04, 2009 8:43 AM)  Nutrition Counseling: Patient's BMI is greater than 25 and therefore counseled on weight management options.  O2 Flow:  Room air  Primary Care Provider:  Etta Grandchild MD   History of Present Illness: She returns today for fasting labs. She has been seen several times by Hematology and it appears that her high WBC is a benign condition. Her only complaint today is persistent fatigue. Also, she has noticed a small, asymptomatic hernia just above and to the left of the belly button.  Hypertension History:      She denies headache, chest pain, palpitations, dyspnea with exertion, orthopnea, PND, peripheral edema, visual symptoms, neurologic problems, syncope, and side effects from treatment.  She notes no problems with any antihypertensive medication side effects.        Positive major cardiovascular risk factors include female age 35 years old or older, diabetes, hyperlipidemia, hypertension, and family history for ischemic heart disease (males less than 5 years old).  Negative major cardiovascular risk factors include non-tobacco-user status.        Positive history for target organ damage include cardiac end organ damage (either CHF or LVH).  Further assessment for target organ damage reveals no history of ASHD, stroke/TIA, peripheral vascular disease, renal insufficiency, or hypertensive retinopathy.    Lipid Management History:      Positive NCEP/ATP III risk factors include female age 57 years old or older, diabetes, HDL cholesterol less than 40, family history for ischemic heart disease  (males less than 38 years old), and hypertension.  Negative NCEP/ATP III risk factors include non-tobacco-user status, no ASHD (atherosclerotic heart disease), no prior stroke/TIA, no peripheral vascular disease, and no history of aortic aneurysm.        The patient states that she knows about the "Therapeutic Lifestyle Change" diet.  Her compliance with the TLC diet is poor.  The patient expresses understanding of adjunctive measures for cholesterol lowering.  Adjunctive measures started by the patient include limit alcohol consumpton and weight reduction.  She expresses no side effects from her lipid-lowering medication.  The patient denies any symptoms to suggest myopathy or liver disease.      Current Medications (verified): 1)  Zoloft 50 Mg  Tabs (Sertraline Hcl) .... Once Daily 2)  Omeprazole 20 Mg  Tbec (Omeprazole) .... Take Two Capsules A Day 3)  Glucophage 1000 Mg  Tabs (Metformin Hcl) .... Take One Tablet Twice Daily 4)  Amaryl 4 Mg Tabs (Glimepiride) .... Once Daily 5)  Crestor 20 Mg  Tabs (Rosuvastatin Calcium) .... At Bedtime 6)  Niaspan 500 Mg  Tbcr (Niacin (Antihyperlipidemic)) .... 2 By Mouth At Bedtime 7)  Adult Aspirin Low Strength 81 Mg  Tbdp (Aspirin) .Marland Kitchen.. 1 By Mouth Once Daily 8)  Freestyle Lancets   Misc (Lancets) .... Accu Check Three Times A Day 9)  Freestyle Lite   Strp (Glucose  Blood) .... Use As Directed 10)  Diovan Hct 160-25 Mg Tabs (Valsartan-Hydrochlorothiazide) .... Qd 11)  Bupropion Hcl 150 Mg Xr24h-Tab (Bupropion Hcl) .... Qam For Depression 12)  Actos 30 Mg Tabs (Pioglitazone Hcl) .... Once Daily For Diabetes  Allergies (verified): No Known Drug Allergies  Past History:  Past Medical History: Reviewed history from 07/12/2008 and no changes required. Diabetes mellitus, type II Depression GERD Hyperlipidemia Nephrolithiasis, hx of Hypertension  Past Surgical History: Reviewed history from 09/15/2008 and no changes required. C/S x 2 ? 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 Fam hx MI Family History Diabetes 1st degree relative Family History High cholesterol Family History Hypertension MGF--stroke Family History of Skin cancer Father died at 9 yoa from Aortic Dissection Family History of CAD Female 1st degree relative <50  Social History: Reviewed history from 05/26/2008 and no changes required. 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  The patient denies anorexia, fever, weight loss, weight gain, chest pain, syncope, prolonged cough, abdominal pain, melena, hematochezia, severe indigestion/heartburn, hematuria, suspicious skin lesions, depression, abnormal bleeding, enlarged lymph nodes, angioedema, and breast masses.   GI:  Denies abdominal pain, change in bowel habits, diarrhea, gas, indigestion, loss of appetite, nausea, vomiting, vomiting blood, and yellowish skin color. Endo:  Denies cold intolerance, excessive hunger, excessive thirst, excessive urination, heat intolerance, polyuria, and weight change.  Physical Exam  General:  alert, well-developed, well-nourished, well-hydrated, cooperative to examination, good hygiene, and overweight-appearing.   Head:  normocephalic and atraumatic.   Eyes:  vision grossly intact, pupils equal, and pupils round.   Mouth:  Oral mucosa and oropharynx without lesions or exudates.  Teeth in good repair. Neck:  supple, full ROM, no masses, no cervical lymphadenopathy, and no neck tenderness.   Lungs:  Normal respiratory effort, chest expands symmetrically. Lungs are clear to auscultation, no crackles or wheezes. Heart:  normal rate, regular rhythm, no murmur, no gallop, and no rub.   Abdomen:  soft, non-tender, normal  bowel sounds, no distention, no masses, no guarding, no hepatomegaly, and no splenomegaly.  incisional hernia very small area to the left side of the lower aspect of the incision. Msk:  normal ROM, no joint tenderness, no joint swelling, no joint warmth, no redness over joints, and no joint deformities.   Pulses:  R and L carotid,radial, dorsalis pedis and posterior tibial pulses are full and equal bilaterally Extremities:  1+ left pedal edema and 1+ right pedal edema.   Neurologic:  No cranial nerve deficits noted. Station and gait are normal. Plantar reflexes are down-going bilaterally. DTRs are symmetrical throughout. Sensory, motor and coordinative functions appear intact.  Diabetes Management Exam:    Foot Exam (with socks and/or shoes not present):       Sensory-Pinprick/Light touch:          Left medial foot (L-4): normal          Left dorsal foot (L-5): normal          Left lateral foot (S-1): normal          Right medial foot (L-4): normal          Right dorsal foot (L-5): normal          Right lateral foot (S-1): normal  Sensory-Monofilament:          Left foot: normal          Right foot: normal       Inspection:          Left foot: normal          Right foot: normal       Nails:          Left foot: normal          Right foot: normal   Impression & Recommendations:  Problem # 1:  LEUKOCYTOSIS (ICD-288.60) Assessment Improved  Problem # 2:  HYPERTENSION (ICD-401.9) Assessment: Improved  Her updated medication list for this problem includes:    Diovan Hct 160-25 Mg Tabs (Valsartan-hydrochlorothiazide) ..... Qd  Orders: Venipuncture ([id number]) TLB-Lipid Panel (80061-LIPID) TLB-Hepatic/Liver Function Pnl (80076-HEPATIC) TLB-TSH (Thyroid Stimulating Hormone) (84443-TSH) TLB-A1C / Hgb A1C (Glycohemoglobin) (83036-A1C) TLB-BMP (Basic Metabolic Panel-BMET) (80048-METABOL)  BP today: 120/78 Prior BP: 124/80 (06/07/2009)  Prior 10 Yr Risk Heart Disease: 24 %  (04/13/2009)  Labs Reviewed: K+: 3.9 (06/07/2009) Creat: : 0.7 (06/07/2009)   Chol: 94 (03/14/2009)   HDL: 33.20 (03/14/2009)   LDL: 37 (03/14/2009)   TG: 118.0 (03/14/2009)  Problem # 3:  HYPERLIPIDEMIA (ICD-272.4) Assessment: Unchanged  Her updated medication list for this problem includes:    Crestor 20 Mg Tabs (Rosuvastatin calcium) .Marland Kitchen... At bedtime    Niaspan 500 Mg Tbcr (Niacin (antihyperlipidemic)) .Marland Kitchen... 2 by mouth at bedtime  Labs Reviewed: SGOT: 21 (03/14/2009)   SGPT: 31 (03/14/2009)  Lipid Goals: Chol Goal: 200 (03/14/2009)   HDL Goal: 40 (03/14/2009)   LDL Goal: 100 (03/14/2009)   TG Goal: 150 (03/14/2009)  Prior 10 Yr Risk Heart Disease: 24 % (04/13/2009)   HDL:33.20 (03/14/2009), 34.50 (11/15/2008)  LDL:37 (03/14/2009), 37 (11/15/2008)  Chol:94 (03/14/2009), 97 (11/15/2008)  Trig:118.0 (03/14/2009), 126.0 (11/15/2008)  Problem # 4:  GERD (ICD-530.81) Assessment: Improved  Her updated medication list for this problem includes:    Omeprazole 20 Mg Tbec (Omeprazole) .Marland Kitchen... Take two capsules a day  Labs Reviewed: Hgb: 12.7 (06/07/2009)   Hct: 38.7 (06/07/2009)  Problem # 5:  DIABETES MELLITUS, TYPE II (ICD-250.00) Assessment: Unchanged  Her updated medication list for this problem includes:    Glucophage 1000 Mg Tabs (Metformin hcl) .Marland Kitchen... Take one tablet twice daily    Amaryl 4 Mg Tabs (Glimepiride) ..... Once daily    Adult Aspirin Low Strength 81 Mg Tbdp (Aspirin) .Marland Kitchen... 1 by mouth once daily    Diovan Hct 160-25 Mg Tabs (Valsartan-hydrochlorothiazide) ..... Qd    Actos 30 Mg Tabs (Pioglitazone hcl) ..... Once daily for diabetes  Orders: Venipuncture ([id number]) TLB-Lipid Panel (80061-LIPID) TLB-Hepatic/Liver Function Pnl (80076-HEPATIC) TLB-TSH (Thyroid Stimulating Hormone) (84443-TSH) TLB-A1C / Hgb A1C (Glycohemoglobin) (83036-A1C) TLB-BMP (Basic Metabolic Panel-BMET) (80048-METABOL)  Labs Reviewed: Creat: 0.7 (06/07/2009)     Last Eye Exam: normal  (07/14/2009) Reviewed HgBA1c results: 8.7 (06/07/2009)  9.2 (03/14/2009)  Complete Medication List: 1)  Zoloft 50 Mg Tabs (Sertraline hcl) .... Once daily 2)  Omeprazole 20 Mg Tbec (Omeprazole) .... Take two capsules a day 3)  Glucophage 1000 Mg Tabs (Metformin hcl) .... Take one tablet twice daily 4)  Amaryl 4 Mg Tabs (Glimepiride) .... Once daily 5)  Crestor 20 Mg Tabs (Rosuvastatin calcium) .... At bedtime 6)  Niaspan 500 Mg Tbcr (Niacin (antihyperlipidemic)) .... 2 by mouth at bedtime 7)  Adult Aspirin Low Strength 81 Mg Tbdp (Aspirin) .Marland Kitchen.. 1 by mouth once daily 8)  Freestyle Lancets Misc (Lancets) .Marland KitchenMarland KitchenMarland Kitchen  Accu check three times a day 9)  Freestyle Lite Strp (Glucose blood) .... Use as directed 10)  Diovan Hct 160-25 Mg Tabs (Valsartan-hydrochlorothiazide) .... Qd 11)  Bupropion Hcl 150 Mg Xr24h-tab (Bupropion hcl) .... Qam for depression 12)  Actos 30 Mg Tabs (Pioglitazone hcl) .... Once daily for diabetes  Hypertension Assessment/Plan:      The patient's hypertensive risk group is category C: Target organ damage and/or diabetes.  Her calculated 10 year risk of coronary heart disease is 24 %.  Today's blood pressure is 120/78.  Her blood pressure goal is < 130/80.  Lipid Assessment/Plan:      Based on NCEP/ATP III, the patient's risk factor category is "history of diabetes".  The patient's lipid goals are as follows: Total cholesterol goal is 200; LDL cholesterol goal is 100; HDL cholesterol goal is 40; Triglyceride goal is 150.    Patient Instructions: 1)  Please schedule a follow-up appointment in 4 months. 2)  It is important that you exercise regularly at least 20 minutes 5 times a week. If you develop chest pain, have severe difficulty breathing, or feel very tired , stop exercising immediately and seek medical attention. 3)  You need to lose weight. Consider a lower calorie diet and regular exercise.  4)  Check your blood sugars regularly. If your readings are usually above 200   or below 70 you should contact our office. 5)  It is important that your Diabetic A1c level is checked every 3 months. 6)  See your eye doctor yearly to check for diabetic eye damage. 7)  Check your feet each night for sore areas, calluses or signs of infection. 8)  Check your Blood Pressure regularly. If it is above 130/80: you should make an appointment.

## 2010-07-25 NOTE — Letter (Signed)
Summary: [facility]   Imported By: Sherian Rein 04/27/2010 08:56:51  _____________________________________________________________________  External Attachment:    Type:   Image     Comment:   External Document

## 2010-07-25 NOTE — Assessment & Plan Note (Signed)
Summary: 4 mos f/u #/cd   Vital Signs:  Patient profile:   60 year old female Menstrual status:  postmenopausal Height:      63 inches Weight:      191 pounds BMI:     33.96 O2 Sat:      95 % on Room air Temp:     97.4 degrees F oral Pulse rate:   84 / minute Pulse rhythm:   regular Resp:     16 per minute BP sitting:   108 / 70  (left arm) Cuff size:   large  Vitals Entered By: Rock Nephew CMA (May 26, 2010 8:41 AM)  O2 Flow:  Room air  Primary Care Provider:  Etta Grandchild MD   History of Present Illness:  Follow-Up Visit      This is a 60 year old woman who presents for Follow-up visit.  The patient complains of high blood sugar symptoms, but denies chest pain, palpitations, dizziness, syncope, low blood sugar symptoms, edema, SOB, DOE, PND, and orthopnea.  Since the last visit the patient notes no new problems or concerns.  The patient reports taking meds as prescribed, monitoring BP, monitoring blood sugars, and dietary noncompliance.  When questioned about possible medication side effects, the patient notes none.    Lipid Management History:      Positive NCEP/ATP III risk factors include female age 61 years old or older, diabetes, family history for ischemic heart disease (males less than 27 years old), and hypertension.  Negative NCEP/ATP III risk factors include non-tobacco-user status, no ASHD (atherosclerotic heart disease), no prior stroke/TIA, no peripheral vascular disease, and no history of aortic aneurysm.        The patient states that she knows about the "Therapeutic Lifestyle Change" diet.  Her compliance with the TLC diet is poor.  The patient expresses understanding of adjunctive measures for cholesterol lowering.  Adjunctive measures started by the patient include aerobic exercise, fiber, limit alcohol consumpton, and weight reduction.  She expresses no side effects from her lipid-lowering medication.  The patient denies any symptoms to suggest myopathy  or liver disease.    Preventive Screening-Counseling & Management  Alcohol-Tobacco     Alcohol drinks/day: 0     Alcohol Counseling: not indicated; patient does not drink     Smoking Status: never     Tobacco Counseling: not indicated; no tobacco use  Hep-HIV-STD-Contraception     Hepatitis Risk: no risk noted     HIV Risk: no risk noted     STD Risk: no risk noted      Sexual History:  currently monogamous.        Drug Use:  never.        Blood Transfusions:  no.    Clinical Review Panels:  Prevention   Last Mammogram:  Normal Bilateral (12/12/2009)   Last Pap Smear:  Normal (12/08/2009)   Last Colonoscopy:  Normal (09/07/2008)  Immunizations   Last Tetanus Booster:  Tdap (10/18/2008)   Last Flu Vaccine:  given (04/05/2009)   Last Pneumovax:  Pneumovax (08/31/2008)  Lipid Management   Cholesterol:  105 (10/04/2009)   LDL (bad choesterol):  28 (10/04/2009)   HDL (good cholesterol):  50.80 (10/04/2009)  Diabetes Management   HgBA1C:  9.8 (02/02/2010)   Creatinine:  0.7 (02/02/2010)   Last Dilated Eye Exam:  normal (07/14/2009)   Last Foot Exam:  yes (05/26/2010)   Last Flu Vaccine:  given (04/05/2009)   Last Pneumovax:  Pneumovax (  08/31/2008)  CBC   WBC:  15.3 (02/02/2010)   RBC:  4.30 (02/02/2010)   Hgb:  12.7 (02/02/2010)   Hct:  38.2 (02/02/2010)   Platelets:  407.0 (02/02/2010)   MCV  88.8 (02/02/2010)   MCHC  33.2 (02/02/2010)   RDW  13.8 (02/02/2010)   PMN:  62.5 (02/02/2010)   Lymphs:  27.6 (02/02/2010)   Monos:  8.2 (02/02/2010)   Eosinophils:  1.4 (02/02/2010)   Basophil:  0.3 (02/02/2010)  Complete Metabolic Panel   Glucose:  229 (02/02/2010)   Sodium:  142 (02/02/2010)   Potassium:  4.8 (02/02/2010)   Chloride:  101 (02/02/2010)   CO2:  28 (02/02/2010)   BUN:  23 (02/02/2010)   Creatinine:  0.7 (02/02/2010)   Albumin:  4.3 (02/02/2010)   Total Protein:  7.1 (02/02/2010)   Calcium:  9.9 (02/02/2010)   Total Bili:  0.3 (02/02/2010)    Alk Phos:  85 (02/02/2010)   SGPT (ALT):  28 (02/02/2010)   SGOT (AST):  20 (02/02/2010)   Medications Prior to Update: 1)  Zoloft 50 Mg  Tabs (Sertraline Hcl) .... Once Daily 2)  Omeprazole 20 Mg  Tbec (Omeprazole) .... Take Two Capsules A Day 3)  Glucophage 1000 Mg  Tabs (Metformin Hcl) .... Take One Tablet Twice Daily 4)  Amaryl 4 Mg Tabs (Glimepiride) .... Once Daily 5)  Crestor 20 Mg  Tabs (Rosuvastatin Calcium) .... At Bedtime 6)  Niaspan 500 Mg  Tbcr (Niacin (Antihyperlipidemic)) .... 2 By Mouth At Bedtime 7)  Adult Aspirin Low Strength 81 Mg  Tbdp (Aspirin) .Marland Kitchen.. 1 By Mouth Once Daily 8)  Freestyle Lancets   Misc (Lancets) .... Accu Check Three Times A Day 9)  Freestyle Lite   Strp (Glucose Blood) .... Use As Directed 10)  Diovan Hct 160-25 Mg Tabs (Valsartan-Hydrochlorothiazide) .... Qd 11)  Bupropion Hcl 150 Mg Xr24h-Tab (Bupropion Hcl) .... Qam For Depression 12)  Actos 30 Mg Tabs (Pioglitazone Hcl) .... Once Daily For Diabetes  Current Medications (verified): 1)  Zoloft 50 Mg  Tabs (Sertraline Hcl) .... Once Daily 2)  Omeprazole 20 Mg  Tbec (Omeprazole) .... Take Two Capsules A Day 3)  Glucophage 1000 Mg  Tabs (Metformin Hcl) .... Take One Tablet Twice Daily 4)  Amaryl 4 Mg Tabs (Glimepiride) .... Once Daily 5)  Crestor 20 Mg  Tabs (Rosuvastatin Calcium) .... At Bedtime 6)  Niaspan 500 Mg  Tbcr (Niacin (Antihyperlipidemic)) .... 2 By Mouth At Bedtime 7)  Adult Aspirin Low Strength 81 Mg  Tbdp (Aspirin) .Marland Kitchen.. 1 By Mouth Once Daily 8)  Freestyle Lancets   Misc (Lancets) .... Accu Check Three Times A Day 9)  Freestyle Lite   Strp (Glucose Blood) .... Use As Directed 10)  Diovan Hct 160-25 Mg Tabs (Valsartan-Hydrochlorothiazide) .... Qd 11)  Bupropion Hcl 150 Mg Xr24h-Tab (Bupropion Hcl) .... Qam For Depression 12)  Actos 30 Mg Tabs (Pioglitazone Hcl) .... Once Daily For Diabetes 13)  Victoza 18 Mg/58ml Soln (Liraglutide) .... 1.8 Mg Per Day  Allergies (verified): No Known  Drug Allergies  Past History:  Past Medical History: Last updated: 01/02/2010 Diabetes mellitus, type II Depression GERD Hyperlipidemia   Nephrolithiasis, hx of Hypertension  Past Surgical History: Last updated: 01/02/2010 C/S x 2 ? appy with C/S T&A T.L Colonoscopy-tics repeat 75yrs-9/29/200E3 EGD Gallstones -04/2005 Rt knee-11/2005 Splenectomy   Family History: Last updated: 07/12/2008 Fam hx MI Family History Diabetes 1st degree relative Family History High cholesterol Family History Hypertension MGF--stroke Family History of Skin cancer  Father died at 97 yoa from Aortic Dissection Family History of CAD Female 1st degree relative <50  Social History: Last updated: 01/02/2010 Married Alcohol use-no Occupation: CSR  Never Smoked Drug use-no Regular exercise-no  Risk Factors: Alcohol Use: 0 (05/26/2010) Exercise: no (05/26/2008)  Risk Factors: Smoking Status: never (05/26/2010)  Family History: Reviewed history from 07/12/2008 and no changes required. Fam hx MI Family History Diabetes 1st degree relative Family History High cholesterol Family History Hypertension MGF--stroke Family History of Skin cancer Father died at 68 yoa from Aortic Dissection Family History of CAD Female 1st degree relative <50  Social History: Reviewed history from 01/02/2010 and no changes required. 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  The patient denies anorexia, fever, weight loss, chest pain, syncope, dyspnea on exertion, peripheral edema, prolonged cough, headaches, hemoptysis, abdominal pain, hematuria, suspicious skin lesions, depression, enlarged lymph nodes, and angioedema.   General:  Denies chills, fatigue, fever, loss of appetite, malaise, sleep disorder, and sweats. Endo:  Denies cold intolerance, excessive hunger, excessive thirst, excessive  urination, heat intolerance, and polyuria.  Physical Exam  General:  alert, well-developed, well-nourished, well-hydrated, appropriate dress, normal appearance, healthy-appearing, cooperative to examination, and overweight-appearing.   Head:  normocephalic, atraumatic, and no abnormalities observed.   Mouth:  Oral mucosa and oropharynx without lesions or exudates.  Teeth in good repair. Neck:  supple, full ROM, no masses, no thyromegaly, no JVD, normal carotid upstroke, no carotid bruits, and no cervical lymphadenopathy.   Lungs:  normal respiratory effort, no intercostal retractions, no accessory muscle use, normal breath sounds, no dullness, no fremitus, no crackles, and no wheezes.   Heart:  normal rate, regular rhythm, no murmur, no gallop, no rub, and no JVD.   Abdomen:  soft, non-tender, decreased but present bowel sounds, no distention, no guarding, no appreciable hepatomegaly or splenomegaly.  incisional hernia to the left side of the incision. Msk:  No deformity or scoliosis noted of thoracic or lumbar spine.   Pulses:  R and L carotid,radial, dorsalis pedis and posterior tibial pulses are full and equal bilaterally Extremities:  trace left pedal edema and trace right pedal edema.   Neurologic:  No cranial nerve deficits noted. Station and gait are normal. Plantar reflexes are down-going bilaterally. DTRs are symmetrical throughout. Sensory, motor and coordinative functions appear intact. Skin:  turgor normal, color normal, no rashes, no suspicious lesions, no ulcerations, and no edema.   Cervical Nodes:  No lymphadenopathy noted Psych:  Oriented X3, memory intact for recent and remote, normally interactive, good eye contact, not anxious appearing, not depressed appearing, and not agitated.    Diabetes Management Exam:    Foot Exam (with socks and/or shoes not present):       Sensory-Pinprick/Light touch:          Left medial foot (L-4): normal          Left dorsal foot (L-5):  normal          Left lateral foot (S-1): normal          Right medial foot (L-4): normal          Right dorsal foot (L-5): normal          Right lateral foot (S-1): normal       Sensory-Monofilament:          Left foot: normal  Right foot: normal       Inspection:          Left foot: normal          Right foot: normal       Nails:          Left foot: normal          Right foot: normal   Impression & Recommendations:  Problem # 1:  LEUKOCYTOSIS (ICD-288.60) Assessment Unchanged  Problem # 2:  HYPERTENSION (ICD-401.9) Assessment: Improved  Her updated medication list for this problem includes:    Diovan Hct 160-25 Mg Tabs (Valsartan-hydrochlorothiazide) ..... Qd  Orders: Venipuncture (59563) TLB-BMP (Basic Metabolic Panel-BMET) (80048-METABOL) TLB-CBC Platelet - w/Differential (85025-CBCD) TLB-Hepatic/Liver Function Pnl (80076-HEPATIC) TLB-TSH (Thyroid Stimulating Hormone) (84443-TSH) TLB-A1C / Hgb A1C (Glycohemoglobin) (83036-A1C) TLB-Udip w/ Micro (81001-URINE)  BP today: 108/70 Prior BP: 110/70 (02/02/2010)  Prior 10 Yr Risk Heart Disease: 7 % (02/02/2010)  Labs Reviewed: K+: 4.8 (02/02/2010) Creat: : 0.7 (02/02/2010)   Chol: 105 (10/04/2009)   HDL: 50.80 (10/04/2009)   LDL: 28 (10/04/2009)   TG: 131.0 (10/04/2009)  Problem # 3:  DIABETES MELLITUS, TYPE II (ICD-250.00) Assessment: Deteriorated  Her updated medication list for this problem includes:    Glucophage 1000 Mg Tabs (Metformin hcl) .Marland Kitchen... Take one tablet twice daily    Amaryl 4 Mg Tabs (Glimepiride) ..... Once daily    Adult Aspirin Low Strength 81 Mg Tbdp (Aspirin) .Marland Kitchen... 1 by mouth once daily    Diovan Hct 160-25 Mg Tabs (Valsartan-hydrochlorothiazide) ..... Qd    Actos 30 Mg Tabs (Pioglitazone hcl) ..... Once daily for diabetes    Victoza 18 Mg/57ml Soln (Liraglutide) .Marland Kitchen... 1.8 mg per day  Orders: Venipuncture (87564) TLB-BMP (Basic Metabolic Panel-BMET) (80048-METABOL) TLB-CBC Platelet -  w/Differential (85025-CBCD) TLB-Hepatic/Liver Function Pnl (80076-HEPATIC) TLB-TSH (Thyroid Stimulating Hormone) (84443-TSH) TLB-A1C / Hgb A1C (Glycohemoglobin) (83036-A1C) TLB-Udip w/ Micro (81001-URINE)  Labs Reviewed: Creat: 0.7 (02/02/2010)     Last Eye Exam: normal (07/14/2009) Reviewed HgBA1c results: 9.8 (02/02/2010)  7.5 (10/04/2009)  Problem # 4:  HYPERLIPIDEMIA (ICD-272.4) Assessment: Unchanged  Her updated medication list for this problem includes:    Crestor 20 Mg Tabs (Rosuvastatin calcium) .Marland Kitchen... At bedtime    Niaspan 500 Mg Tbcr (Niacin (antihyperlipidemic)) .Marland Kitchen... 2 by mouth at bedtime  Orders: Venipuncture (33295) TLB-BMP (Basic Metabolic Panel-BMET) (80048-METABOL) TLB-CBC Platelet - w/Differential (85025-CBCD) TLB-Hepatic/Liver Function Pnl (80076-HEPATIC) TLB-TSH (Thyroid Stimulating Hormone) (84443-TSH) TLB-A1C / Hgb A1C (Glycohemoglobin) (83036-A1C) TLB-Udip w/ Micro (81001-URINE)  Labs Reviewed: SGOT: 20 (02/02/2010)   SGPT: 28 (02/02/2010)  Lipid Goals: Chol Goal: 200 (03/14/2009)   HDL Goal: 40 (03/14/2009)   LDL Goal: 100 (03/14/2009)   TG Goal: 150 (03/14/2009)  Prior 10 Yr Risk Heart Disease: 7 % (02/02/2010)   HDL:50.80 (10/04/2009), 33.20 (03/14/2009)  LDL:28 (10/04/2009), 37 (03/14/2009)  Chol:105 (10/04/2009), 94 (03/14/2009)  Trig:131.0 (10/04/2009), 118.0 (03/14/2009)  Complete Medication List: 1)  Zoloft 50 Mg Tabs (Sertraline hcl) .... Once daily 2)  Omeprazole 20 Mg Tbec (Omeprazole) .... Take two capsules a day 3)  Glucophage 1000 Mg Tabs (Metformin hcl) .... Take one tablet twice daily 4)  Amaryl 4 Mg Tabs (Glimepiride) .... Once daily 5)  Crestor 20 Mg Tabs (Rosuvastatin calcium) .... At bedtime 6)  Niaspan 500 Mg Tbcr (Niacin (antihyperlipidemic)) .... 2 by mouth at bedtime 7)  Adult Aspirin Low Strength 81 Mg Tbdp (Aspirin) .Marland Kitchen.. 1 by mouth once daily 8)  Freestyle Lancets Misc (Lancets) .... Accu check three times a day 9)  Freestyle Lite Strp (Glucose blood) .... Use as directed 10)  Diovan Hct 160-25 Mg Tabs (Valsartan-hydrochlorothiazide) .... Qd 11)  Bupropion Hcl 150 Mg Xr24h-tab (Bupropion hcl) .... Qam for depression 12)  Actos 30 Mg Tabs (Pioglitazone hcl) .... Once daily for diabetes 13)  Victoza 18 Mg/103ml Soln (Liraglutide) .... 1.8 mg per day  Lipid Assessment/Plan:      Based on NCEP/ATP III, the patient's risk factor category is "history of diabetes".  The patient's lipid goals are as follows: Total cholesterol goal is 200; LDL cholesterol goal is 100; HDL cholesterol goal is 40; Triglyceride goal is 150.     Patient Instructions: 1)  Please schedule a follow-up appointment in 3 months. 2)  It is important that you exercise regularly at least 20 minutes 5 times a week. If you develop chest pain, have severe difficulty breathing, or feel very tired , stop exercising immediately and seek medical attention. 3)  You need to lose weight. Consider a lower calorie diet and regular exercise.  4)  Check your blood sugars regularly. If your readings are usually above 200 or below 70 you should contact our office. 5)  It is important that your Diabetic A1c level is checked every 3 months. 6)  See your eye doctor yearly to check for diabetic eye damage. 7)  Check your feet each night for sore areas, calluses or signs of infection. 8)  Check your Blood Pressure regularly. If it is above 130/80: you should make an appointment. Prescriptions: VICTOZA 18 MG/3ML SOLN (LIRAGLUTIDE) 1.8 mg per day  #90 day x 3   Entered and Authorized by:   Etta Grandchild MD   Signed by:   Etta Grandchild MD on 05/26/2010   Method used:   Historical   RxID:   [id number]    Orders Added: 1)  Venipuncture [30865] 2)  TLB-BMP (Basic Metabolic Panel-BMET) [80048-METABOL] 3)  TLB-CBC Platelet - w/Differential [85025-CBCD] 4)  TLB-Hepatic/Liver Function Pnl [80076-HEPATIC] 5)  TLB-TSH (Thyroid Stimulating Hormone)  [84443-TSH] 6)  TLB-A1C / Hgb A1C (Glycohemoglobin) [83036-A1C] 7)  TLB-Udip w/ Micro [81001-URINE] 8)  Est. Patient Level IV [78469]

## 2010-07-25 NOTE — Progress Notes (Signed)
    Appended Document:     Diabetes Management Exam:    Eye Exam:       Eye Exam done elsewhere          Date: 07/14/2009          Results: normal          Done by: Salvatore Marvel

## 2010-07-25 NOTE — Letter (Signed)
Summary: [facility]  [facility]   Imported By: Lennie Odor 07/29/2009 12:10:20  _____________________________________________________________________  External Attachment:    Type:   Image     Comment:   External Document

## 2010-07-25 NOTE — Assessment & Plan Note (Signed)
Summary: 4 mos f/u / # cd   Vital Signs:  Patient profile:   60 year old female Height:      63 inches Weight:      193 pounds BMI:     34.31 O2 Sat:      98 % on Room air Temp:     98.0 degrees F oral Pulse rate:   80 / minute Pulse rhythm:   regular Resp:     16 per minute BP sitting:   110 / 70  (left arm) Cuff size:   large  Vitals Entered By: Rock Nephew CMA (February 02, 2010 8:23 AM)  Nutrition Counseling: Patient's BMI is greater than 25 and therefore counseled on weight management options.  O2 Flow:  Room air CC: follow-up visit// 4 mos, Lipid Management Is Patient Diabetic? Yes Did you bring your meter with you today? No Pain Assessment Patient in pain? no        Primary Care Provider:  Etta Grandchild MD  CC:  follow-up visit// 4 mos and Lipid Management.  History of Present Illness:  Follow-Up Visit      This is a 60 year old woman who presents for Follow-up visit.  The patient complains of high blood sugar symptoms, but denies chest pain, palpitations, dizziness, syncope, low blood sugar symptoms, edema, SOB, DOE, PND, and orthopnea.  Since the last visit the patient notes no new problems or concerns and being seen by a specialist.  The patient reports taking meds as prescribed, monitoring BP, monitoring blood sugars, and dietary compliance.  When questioned about possible medication side effects, the patient notes none.    Lipid Management History:      Positive NCEP/ATP III risk factors include female age 61 years old or older, diabetes, family history for ischemic heart disease (males less than 61 years old), and hypertension.  Negative NCEP/ATP III risk factors include non-tobacco-user status, no ASHD (atherosclerotic heart disease), no prior stroke/TIA, no peripheral vascular disease, and no history of aortic aneurysm.        The patient states that she knows about the "Therapeutic Lifestyle Change" diet.  Her compliance with the TLC diet is poor.  The  patient expresses understanding of adjunctive measures for cholesterol lowering.  Adjunctive measures started by the patient include limit alcohol consumpton.  She expresses no side effects from her lipid-lowering medication.  The patient denies any symptoms to suggest myopathy or liver disease.    Preventive Screening-Counseling & Management  Alcohol-Tobacco     Alcohol drinks/day: 0     Smoking Status: never  Hep-HIV-STD-Contraception     Hepatitis Risk: no risk noted     HIV Risk: no risk noted     STD Risk: no risk noted  Safety-Violence-Falls     Seat Belt Use: yes     Helmet Use: yes     Firearms in the Home: no firearms in the home     Smoke Detectors: yes     Violence in the Home: no risk noted      Sexual History:  currently monogamous.        Drug Use:  never.        Blood Transfusions:  no.    Clinical Review Panels:  Lipid Management   Cholesterol:  105 (10/04/2009)   LDL (bad choesterol):  28 (10/04/2009)   HDL (good cholesterol):  50.80 (10/04/2009)  Diabetes Management   HgBA1C:  7.5 (10/04/2009)   Creatinine:  0.7 (01/09/2010)  Last Dilated Eye Exam:  normal (07/14/2009)   Last Foot Exam:  yes (02/02/2010)   Last Flu Vaccine:  given (04/05/2009)   Last Pneumovax:  Pneumovax (08/31/2008)  CBC   WBC:  15.1 (06/07/2009)   RBC:  4.32 (06/07/2009)   Hgb:  12.7 (06/07/2009)   Hct:  38.7 (06/07/2009)   Platelets:  412.0 (06/07/2009)   MCV  89.5 (06/07/2009)   MCHC  32.9 (06/07/2009)   RDW  13.2 (06/07/2009)   PMN:  58.6 (06/07/2009)   Lymphs:  29.2 (06/07/2009)   Monos:  9.7 (06/07/2009)   Eosinophils:  2.0 (06/07/2009)   Basophil:  0.5 (06/07/2009)  Complete Metabolic Panel   Glucose:  148 (10/04/2009)   Sodium:  144 (10/04/2009)   Potassium:  4.3 (10/04/2009)   Chloride:  106 (10/04/2009)   CO2:  30 (10/04/2009)   BUN:  21 (01/09/2010)   Creatinine:  0.7 (01/09/2010)   Albumin:  4.4 (10/04/2009)   Total Protein:  7.4 (10/04/2009)    Calcium:  9.8 (10/04/2009)   Total Bili:  0.4 (10/04/2009)   Alk Phos:  70 (10/04/2009)   SGPT (ALT):  25 (10/04/2009)   SGOT (AST):  19 (10/04/2009)   Medications Prior to Update: 1)  Zoloft 50 Mg  Tabs (Sertraline Hcl) .... Once Daily 2)  Omeprazole 20 Mg  Tbec (Omeprazole) .... Take Two Capsules A Day 3)  Glucophage 1000 Mg  Tabs (Metformin Hcl) .... Take One Tablet Twice Daily 4)  Amaryl 4 Mg Tabs (Glimepiride) .... Once Daily 5)  Crestor 20 Mg  Tabs (Rosuvastatin Calcium) .... At Bedtime 6)  Niaspan 500 Mg  Tbcr (Niacin (Antihyperlipidemic)) .... 2 By Mouth At Bedtime 7)  Adult Aspirin Low Strength 81 Mg  Tbdp (Aspirin) .Marland Kitchen.. 1 By Mouth Once Daily 8)  Freestyle Lancets   Misc (Lancets) .... Accu Check Three Times A Day 9)  Freestyle Lite   Strp (Glucose Blood) .... Use As Directed 10)  Diovan Hct 160-25 Mg Tabs (Valsartan-Hydrochlorothiazide) .... Qd 11)  Bupropion Hcl 150 Mg Xr24h-Tab (Bupropion Hcl) .... Qam For Depression 12)  Actos 30 Mg Tabs (Pioglitazone Hcl) .... Once Daily For Diabetes  Current Medications (verified): 1)  Zoloft 50 Mg  Tabs (Sertraline Hcl) .... Once Daily 2)  Omeprazole 20 Mg  Tbec (Omeprazole) .... Take Two Capsules A Day 3)  Glucophage 1000 Mg  Tabs (Metformin Hcl) .... Take One Tablet Twice Daily 4)  Amaryl 4 Mg Tabs (Glimepiride) .... Once Daily 5)  Crestor 20 Mg  Tabs (Rosuvastatin Calcium) .... At Bedtime 6)  Niaspan 500 Mg  Tbcr (Niacin (Antihyperlipidemic)) .... 2 By Mouth At Bedtime 7)  Adult Aspirin Low Strength 81 Mg  Tbdp (Aspirin) .Marland Kitchen.. 1 By Mouth Once Daily 8)  Freestyle Lancets   Misc (Lancets) .... Accu Check Three Times A Day 9)  Freestyle Lite   Strp (Glucose Blood) .... Use As Directed 10)  Diovan Hct 160-25 Mg Tabs (Valsartan-Hydrochlorothiazide) .... Qd 11)  Bupropion Hcl 150 Mg Xr24h-Tab (Bupropion Hcl) .... Qam For Depression 12)  Actos 30 Mg Tabs (Pioglitazone Hcl) .... Once Daily For Diabetes  Allergies (verified): No Known  Drug Allergies  Past History:  Past Medical History: Last updated: 01/02/2010 Diabetes mellitus, type II Depression GERD Hyperlipidemia   Nephrolithiasis, hx of Hypertension  Past Surgical History: Last updated: 01/02/2010 C/S x 2 ? appy with C/S T&A T.L Colonoscopy-tics repeat 73yrs-9/29/200E3 EGD Gallstones -04/2005 Rt knee-11/2005 Splenectomy   Family History: Last updated: 07/12/2008 Fam hx MI Family History  Diabetes 1st degree relative Family History High cholesterol Family History Hypertension MGF--stroke Family History of Skin cancer Father died at 22 yoa from Aortic Dissection Family History of CAD Female 1st degree relative <50  Social History: Last updated: 01/02/2010 Married Alcohol use-no Occupation: CSR  Never Smoked Drug use-no Regular exercise-no  Risk Factors: Alcohol Use: 0 (02/02/2010) Exercise: no (05/26/2008)  Risk Factors: Smoking Status: never (02/02/2010)  Family History: Reviewed history from 07/12/2008 and no changes required. Fam hx MI Family History Diabetes 1st degree relative Family History High cholesterol Family History Hypertension MGF--stroke Family History of Skin cancer Father died at 75 yoa from Aortic Dissection Family History of CAD Female 1st degree relative <50  Social History: Reviewed history from 01/02/2010 and no changes required. 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   Endo:  Denies cold intolerance, excessive hunger, excessive  thirst, excessive urination, heat intolerance, polyuria, and weight change. Heme:  Denies abnormal bruising, bleeding, enlarge lymph nodes, fevers, pallor, and skin discoloration.  Physical Exam  General:  alert, well-developed, well-nourished, well-hydrated, appropriate dress, normal appearance, healthy-appearing, cooperative to examination, and overweight-appearing.   Head:  normocephalic, atraumatic, and no abnormalities observed.   Eyes:  vision grossly intact, pupils equal, pupils round, and pupils reactive to light.   Mouth:  Oral mucosa and oropharynx without lesions or exudates.  Teeth in good repair. Neck:  supple, full ROM, no masses, no thyromegaly, no JVD, normal carotid upstroke, no carotid bruits, and no cervical lymphadenopathy.   Lungs:  normal respiratory effort, no intercostal retractions, no accessory muscle use, normal breath sounds, no dullness, no fremitus, no crackles, and no wheezes.   Heart:  normal rate, regular rhythm, no murmur, no gallop, no rub, and no JVD.   Abdomen:  soft, non-tender, decreased but present bowel sounds, no distention, no guarding, no appreciable hepatomegaly or splenomegaly.  incisional hernia to the left side of the incision. Msk:  No deformity or scoliosis noted of thoracic or lumbar spine.   Pulses:  R and L carotid,radial, dorsalis pedis and posterior tibial pulses are full and equal bilaterally Extremities:  trace left pedal edema and trace right pedal edema.   Neurologic:  No cranial nerve deficits noted. Station and gait are normal. Plantar reflexes are down-going bilaterally. DTRs are symmetrical throughout. Sensory, motor and coordinative functions appear intact. Skin:  turgor normal, color normal, no rashes, no suspicious lesions, no ulcerations, and no edema.   Cervical Nodes:  No lymphadenopathy noted Axillary Nodes:  no R axillary adenopathy and no L axillary adenopathy.   Inguinal Nodes:  no R inguinal adenopathy and no L inguinal  adenopathy.   Psych:  Oriented X3, memory intact for recent and remote, normally interactive, good eye contact, not anxious appearing, not depressed appearing, and not agitated.    Diabetes Management Exam:    Foot Exam (with socks and/or shoes not present):       Sensory-Pinprick/Light touch:          Left medial foot (L-4): normal          Left dorsal foot (L-5): normal  Left lateral foot (S-1): normal          Right medial foot (L-4): normal          Right dorsal foot (L-5): normal          Right lateral foot (S-1): normal       Sensory-Monofilament:          Left foot: normal          Right foot: normal       Inspection:          Left foot: normal          Right foot: normal       Nails:          Left foot: normal          Right foot: normal   Impression & Recommendations:  Problem # 1:  VENTRAL HERNIA (ICD-553.20) Assessment Unchanged this is being treated by Dr. 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 Qd  Orders: Venipuncture ([address]) TLB-BMP (Basic Metabolic Panel-BMET) (80048-METABOL) TLB-CBC Platelet - w/Differential (85025-CBCD) TLB-Hepatic/Liver Function Pnl (80076-HEPATIC) TLB-TSH (Thyroid Stimulating Hormone) (84443-TSH) TLB-A1C / Hgb A1C (Glycohemoglobin) (83036-A1C)  BP today: 110/70 Prior BP: 148/100 (01/02/2010)  Prior 10 Yr  Risk Heart Disease: 24 % (04/13/2009)  Labs Reviewed: K+: 4.3 (10/04/2009) Creat: : 0.7 (01/09/2010)   Chol: 105 (10/04/2009)   HDL: 50.80 (10/04/2009)   LDL: 28 (10/04/2009)   TG: 131.0 (10/04/2009)  Problem # 5:  DIABETES MELLITUS, TYPE II (ICD-250.00) Assessment: Unchanged  Her updated medication list for this problem includes:    Glucophage 1000 Mg Tabs (Metformin hcl) .Marland Kitchen... Take one tablet twice daily    Amaryl 4 Mg Tabs (Glimepiride) ..... Once daily    Adult Aspirin Low Strength 81 Mg Tbdp (Aspirin) .Marland Kitchen... 1 by mouth once daily    Diovan Hct 160-25 Mg Tabs (Valsartan-hydrochlorothiazide) ..... Qd    Actos 30 Mg Tabs (Pioglitazone hcl) ..... Once daily for diabetes  Orders: Venipuncture ([address]) TLB-BMP (Basic Metabolic Panel-BMET) (80048-METABOL) TLB-CBC Platelet - w/Differential (85025-CBCD) TLB-Hepatic/Liver Function Pnl (80076-HEPATIC) TLB-TSH (Thyroid Stimulating Hormone) (84443-TSH) TLB-A1C / Hgb A1C (Glycohemoglobin) (83036-A1C)  Labs Reviewed: Creat: 0.7 (01/09/2010)     Last Eye Exam: normal (07/14/2009) Reviewed HgBA1c results: 7.5 (10/04/2009)  8.7 (06/07/2009)  Complete Medication List: 1)  Zoloft 50 Mg Tabs (Sertraline hcl) .... Once daily 2)  Omeprazole 20 Mg Tbec (Omeprazole) .... Take two capsules a day 3)  Glucophage 1000 Mg Tabs (Metformin hcl) .... Take one tablet twice daily 4)  Amaryl 4 Mg Tabs (Glimepiride) .... Once daily 5)  Crestor 20 Mg Tabs (Rosuvastatin calcium) .... At bedtime 6)  Niaspan 500 Mg Tbcr (Niacin (antihyperlipidemic)) .... 2 by mouth at bedtime 7)  Adult Aspirin Low Strength 81 Mg Tbdp (Aspirin) .Marland Kitchen.. 1 by mouth once daily 8)  Freestyle Lancets Misc (Lancets) .... Accu check three times a day 9)  Freestyle Lite Strp (Glucose blood) .... Use as directed 10)  Diovan Hct 160-25 Mg Tabs (Valsartan-hydrochlorothiazide) .... Qd 11)  Bupropion Hcl  150 Mg Xr24h-tab (Bupropion hcl) .... Qam for depression 12)  Actos 30 Mg Tabs  (Pioglitazone hcl) .... Once daily for diabetes  Lipid Assessment/Plan:      Based on NCEP/ATP III, the patient's risk factor category is "history of diabetes".  The patient's lipid goals are as follows: Total cholesterol goal is 200; LDL cholesterol goal is 100; HDL cholesterol goal is 40; Triglyceride goal is 150.    Colorectal Screening:  Current Recommendations:    Hemoccult: patient refused  Colonoscopy Results:    Date of Exam: 09/07/2008    Results: Normal  PAP Screening:    Hx Cervical Dysplasia in last 5 yrs? No    3 normal PAP smears in last 5 yrs? Yes    Last PAP smear:  12/08/2009  PAP Smear Results:    Date of Exam:  12/08/2009    Results:  Normal  Mammogram Screening:    Last Mammogram:  12/12/2009  Mammogram Results:    Date of Exam:  12/12/2009    Results:  Normal Bilateral  Osteoporosis Risk Assessment:  Risk Factors for Fracture or Low Bone Density:   Race (White or Asian):     yes   Smoking status:       never  Immunization & Chemoprophylaxis:    Tetanus vaccine: Tdap  (10/18/2008)    Influenza vaccine: given  (04/05/2009)    Pneumovax: Pneumovax  (08/31/2008)   Patient Instructions: 1)  Please schedule a follow-up appointment in 4 months. 2)  It is important that you exercise regularly at least 20 minutes 5 times a week. If you develop chest pain, have severe difficulty breathing, or feel very tired , stop exercising immediately and seek medical attention. 3)  You need to lose weight. Consider a lower calorie diet and regular exercise.  4)  Check your blood sugars regularly. If your readings are usually above 200 or below 70 you should contact our office. 5)  It is important that your Diabetic A1c level is checked every 3 months. 6)  See your eye doctor yearly to check for diabetic eye damage. 7)  Check your feet each night for sore areas, calluses or signs of infection. 8)  Check your Blood Pressure regularly. If it is above 130/80: you should  make an appointment.

## 2010-07-25 NOTE — Letter (Signed)
Summary: Results Follow-up Letter  [facility]  [address] [place], [place] [address]   Phone: [phone]  Fax: [phone]    10/05/2009  [address] [place], [place]  [address]  Dear Margaret Norman,   The following are the results of your recent test(s):  Test     Result     A1C= 7.5     pretty good blood sugar control Kidney/liver   normal Thyroid     normal _________________________________________________________  Please call for an appointment as directed, keep up the good work _________________________________________________________ _________________________________________________________ _________________________________________________________  Sincerely,  Sanda Linger MD [facility]

## 2010-07-25 NOTE — Letter (Signed)
Summary: Lipid Letter  [facility]  [address] [place], [place] [address]   Phone: [phone]  Fax: [phone]    10/05/2009  Margaret Norman [address] [place], [place]  [address]  Dear Margaret Norman:  We have carefully reviewed your last lipid profile from 10/04/2009 and the results are noted below with a summary of recommendations for lipid management.    Cholesterol:       105     Goal: <200   HDL "good" Cholesterol:   16.60     Goal: >40   LDL "bad" Cholesterol:   28     Goal: <100   Triglycerides:       131.0     Goal: <150    EXCELLENT RESULTS    TLC Diet (Therapeutic Lifestyle Change): Saturated Fats & Transfatty acids should be kept < 7% of total calories ***Reduce Saturated Fats Polyunstaurated Fat can be up to 10% of total calories Monounsaturated Fat Fat can be up to 20% of total calories Total Fat should be no greater than 25-35% of total calories Carbohydrates should be 50-60% of total calories Protein should be approximately 15% of total calories Fiber should be at least 20-30 grams a day ***Increased fiber may help lower LDL Total Cholesterol should be < 200mg /day Consider adding plant stanol/sterols to diet (example: Benacol spread) ***A higher intake of unsaturated fat may reduce Triglycerides and Increase HDL    Adjunctive Measures (may lower LIPIDS and reduce risk of Heart Attack) include: Aerobic Exercise (20-30 minutes 3-4 times a week) Limit Alcohol Consumption Weight Reduction Aspirin 75-81 mg a day by mouth (if not allergic or contraindicated) Dietary Fiber 20-30 grams a day by mouth     Current Medications: 1)    Zoloft 50 Mg  Tabs (Sertraline hcl) .... Once daily 2)    Omeprazole 20 Mg  Tbec (Omeprazole) .... Take two capsules a day 3)    Glucophage 1000 Mg  Tabs (Metformin hcl) .... Take one tablet twice daily 4)    Amaryl 4 Mg Tabs (Glimepiride) .... Once daily 5)    Crestor 20 Mg  Tabs (Rosuvastatin calcium) .... At  bedtime 6)    Niaspan 500 Mg  Tbcr (Niacin (antihyperlipidemic)) .... 2 by mouth at bedtime 7)    Adult Aspirin Low Strength 81 Mg  Tbdp (Aspirin) .Marland Kitchen.. 1 by mouth once daily 8)    Freestyle Lancets   Misc (Lancets) .... Accu check three times a day 9)    Freestyle Lite   Strp (Glucose blood) .... Use as directed 10)    Diovan Hct 160-25 Mg Tabs (Valsartan-hydrochlorothiazide) .... Qd 11)    Bupropion Hcl 150 Mg Xr24h-tab (Bupropion hcl) .... Qam for depression 12)    Actos 30 Mg Tabs (Pioglitazone hcl) .... Once daily for diabetes  If you have any questions, please call. We appreciate being able to work with you.   Sincerely,     [facility] Etta Grandchild MD

## 2010-07-25 NOTE — Assessment & Plan Note (Signed)
Summary: DR Yetta Barre PT/NO CLINIC--STOMACH HERNIA/SORE TO TOUCH STC   Vital Signs:  Patient profile:   60 year old female Height:      63 inches (160.02 cm) O2 Sat:      97 % on Room air Temp:     98.6 degrees F (37.00 degrees C) oral Pulse rate:   91 / minute BP sitting:   148 / 100  (left arm) Cuff size:   large  Vitals Entered By: Orlan Leavens (January 02, 2010 3:30 PM)  O2 Flow:  Room air CC: Stomach hernia, Abdominal pain Is Patient Diabetic? Yes Did you bring your meter with you today? No Pain Assessment Patient in pain? yes     Location: stomach Type: sore to touch   Primary Care Provider:  Etta Grandchild MD  CC:  Stomach hernia and Abdominal pain.  History of Present Illness:  Abdominal Pain      This is a 60 year old woman who presents with Abdominal pain.  The symptoms began 3 months ago.  On a scale of 1 to 10, the intensity is described as a 5.  onset following pancreas surg at [facility] 08/2009 - small ventral hernia to left side of inscion - increase in size over time - now with pain at site of hernia and LLQ.  The patient reports constipation, but denies nausea, vomiting, diarrhea, hematochezia, anorexia, and hematemesis.  The location of the pain is epigastric and left lower quadrant.  The pain is described as intermittent and dull.  The patient denies the following symptoms: fever, weight loss, dysuria, chest pain, and jaundice.  The pain is worse with jarring of the abdomen.  The pain is better with food and sleep.    Current Medications (verified): 1)  Zoloft 50 Mg  Tabs (Sertraline Hcl) .... Once Daily 2)  Omeprazole 20 Mg  Tbec (Omeprazole) .... Take Two Capsules A Day 3)  Glucophage 1000 Mg  Tabs (Metformin Hcl) .... Take One Tablet Twice Daily 4)  Amaryl 4 Mg Tabs (Glimepiride) .... Once Daily 5)  Crestor 20 Mg  Tabs (Rosuvastatin Calcium) .... At Bedtime 6)  Niaspan 500 Mg  Tbcr (Niacin (Antihyperlipidemic)) .... 2 By Mouth At Bedtime 7)  Adult Aspirin Low  Strength 81 Mg  Tbdp (Aspirin) .Marland Kitchen.. 1 By Mouth Once Daily 8)  Freestyle Lancets   Misc (Lancets) .... Accu Check Three Times A Day 9)  Freestyle Lite   Strp (Glucose Blood) .... Use As Directed 10)  Diovan Hct 160-25 Mg Tabs (Valsartan-Hydrochlorothiazide) .... Qd 11)  Bupropion Hcl 150 Mg Xr24h-Tab (Bupropion Hcl) .... Qam For Depression 12)  Actos 30 Mg Tabs (Pioglitazone Hcl) .... Once Daily For Diabetes  Allergies (verified): No Known Drug Allergies  Past History:  Past Medical History: Diabetes mellitus, type II Depression GERD Hyperlipidemia   Nephrolithiasis, hx of Hypertension  Past Surgical History: C/S x 2 ? 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    Physical Exam  General:  alert, well-developed, well-nourished, well-hydrated, cooperative to examination, good hygiene, and overweight-appearing.  nontoxic Lungs:  normal respiratory effort, no intercostal retractions or use of accessory muscles; normal breath sounds bilaterally - no crackles and no wheezes.    Heart:  normal rate, regular rhythm, no murmur,  and no rub. BLE without edema. Abdomen:  soft, non-tender, decreased but present bowel sounds, no distention, no guarding, no appreciable hepatomegaly or splenomegaly.  incisional hernia to the left side of the incision.   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 Referral (Misc. Ref)  Complete Medication List: 1)  Zoloft 50 Mg Tabs (Sertraline hcl) .... Once daily 2)  Omeprazole 20 Mg Tbec (Omeprazole) .... Take two capsules a day 3)  Glucophage 1000 Mg Tabs (Metformin hcl) .... Take one tablet twice daily 4)  Amaryl 4 Mg Tabs (Glimepiride) .... Once daily 5)  Crestor 20 Mg Tabs (Rosuvastatin calcium) .... At bedtime 6)  Niaspan 500 Mg Tbcr (Niacin (antihyperlipidemic)) .... 2 by mouth at bedtime 7)  Adult Aspirin Low Strength 81 Mg Tbdp (Aspirin) .Marland Kitchen.. 1 by mouth once daily 8)  Freestyle Lancets Misc (Lancets) .... Accu check three times a day 9)  Freestyle Lite Strp (Glucose blood) .... Use as directed 10)  Diovan Hct 160-25 Mg Tabs (Valsartan-hydrochlorothiazide) .... Qd 11)  Bupropion Hcl 150 Mg Xr24h-tab (Bupropion hcl) .... Qam for depression 12)  Actos 30 Mg Tabs (Pioglitazone hcl) .... Once daily for diabetes  Patient Instructions: 1)  it was good to see you today. 2)  we'll make referral for CT scan abd+pelvis with contrast. Our office will contact you regarding this appointment once made.  3)  Once these results are reviewed, will plan referral to surgeon to treat as needed - let us know if you prefer to go to [facility] or locally with surgucal group here - 4)  Please schedule an appointment with your primary doctor in : 2 weeks to review, sooner if problems

## 2010-07-25 NOTE — Letter (Signed)
Summary: [facility]  [facility]   Imported By: Sherian Rein 10/25/2009 10:21:21  _____________________________________________________________________  External Attachment:    Type:   Image     Comment:   External Document

## 2010-07-27 NOTE — Progress Notes (Signed)
Summary: RF  Phone Note Call from Patient Call back at Work Phone [phone]   Summary of Call: Pt needs pen needles, left vm for pt to check w/pharm Initial call taken by: Lamar Sprinkles, CMA,  June 22, 2010 12:17 PM    New/Updated Medications: * PEN NEEDLES pt preference, to use with Victoza Prescriptions: PEN NEEDLES pt preference, to use with Victoza  #3 mth x 3   Entered by:   Lamar Sprinkles, CMA   Authorized by:   Etta Grandchild MD   Signed by:   Lamar Sprinkles, CMA on 06/22/2010   Method used:   Faxed to ...       [facility]  [address] [id number] (retail)       [address]       [place], [place]  [address]       Ph: [phone] or [phone]       Fax: [phone]   RxID:   [id number]

## 2010-07-27 NOTE — Letter (Signed)
Summary: NP Eval/[facility]  NP Eval/[facility]   Imported By: Sherian Rein 06/12/2010 10:01:40  _____________________________________________________________________  External Attachment:    Type:   Image     Comment:   External Document

## 2010-07-28 NOTE — Letter (Signed)
Summary: [facility]  [facility]   Imported By: Lennie Odor 03/28/2010 13:57:27  _____________________________________________________________________  External Attachment:    Type:   Image     Comment:   External Document

## 2010-07-28 NOTE — Letter (Signed)
Summary: [facility]  [facility]   Imported By: Sherian Rein 07/11/2009 11:35:15  _____________________________________________________________________  External Attachment:    Type:   Image     Comment:   External Document

## 2010-08-10 NOTE — Letter (Signed)
Summary: Hernia/Michael F Reynolds MD[facility]   Hernia/Michael Camelia Phenes [facility]   Imported By: Lester Androscoggin 08/01/2010 08:56:52  _____________________________________________________________________  External Attachment:    Type:   Image     Comment:   External Document

## 2010-08-21 ENCOUNTER — Telehealth (INDEPENDENT_AMBULATORY_CARE_PROVIDER_SITE_OTHER): Payer: Self-pay | Admitting: *Deleted

## 2010-08-21 ENCOUNTER — Other Ambulatory Visit: Payer: Self-pay | Admitting: Internal Medicine

## 2010-08-21 ENCOUNTER — Encounter: Payer: Self-pay | Admitting: Internal Medicine

## 2010-08-21 ENCOUNTER — Ambulatory Visit (INDEPENDENT_AMBULATORY_CARE_PROVIDER_SITE_OTHER)
Admission: RE | Admit: 2010-08-21 | Discharge: 2010-08-21 | Disposition: A | Discharge status: Home / Self Care | Payer: 59 | Source: Ambulatory Visit | Attending: Internal Medicine | Admitting: Internal Medicine

## 2010-08-21 ENCOUNTER — Ambulatory Visit (INDEPENDENT_AMBULATORY_CARE_PROVIDER_SITE_OTHER): Payer: 59 | Admitting: Internal Medicine

## 2010-08-21 DIAGNOSIS — J209 Acute bronchitis, unspecified: Secondary | ICD-10-CM

## 2010-08-21 DIAGNOSIS — R05 Cough: Secondary | ICD-10-CM

## 2010-08-21 DIAGNOSIS — I10 Essential (primary) hypertension: Secondary | ICD-10-CM

## 2010-08-31 NOTE — Assessment & Plan Note (Signed)
Summary: HEADACHE/ COMING AT 1:30/ PER DR Yetta Barre Natale Milch   Vital Signs:  Patient profile:   60 year old female Menstrual status:  postmenopausal Height:      63 inches Weight:      182 pounds BMI:     32.36 O2 Sat:      97 % Temp:     98.8 degrees F oral Pulse rate:   93 / minute Pulse rhythm:   regular Resp:     16 per minute BP sitting:   142 / 88  (left arm) Cuff size:   regular  Vitals Entered By: Lamar Sprinkles, CMA (August 21, 2010 2:23 PM)  Nutrition Counseling: Patient's BMI is greater than 25 and therefore counseled on weight management options. CC: Sinus & chest congestion/SD, URI symptoms Is Patient Diabetic? Yes Did you bring your meter with you today? No Pain Assessment Patient in pain? no        Primary Care Provider:  Etta Grandchild MD  CC:  Sinus & chest congestion/SD and URI symptoms.  History of Present Illness:  URI Symptoms      This is a 60 year old woman who presents with URI symptoms.  The symptoms began 4 days ago.  The severity is described as moderate.  The patient reports nasal congestion, sore throat, and productive cough, but denies clear nasal discharge, purulent nasal discharge, dry cough, earache, and sick contacts.  Associated symptoms include low-grade fever (<100.5 degrees).  The patient denies fever to >104 degrees, stiff neck, dyspnea, wheezing, rash, vomiting, diarrhea, use of an antipyretic, and response to antipyretic.  The patient also reports headache, muscle aches, and severe fatigue.  The patient denies itchy throat, sneezing, and seasonal symptoms.  The patient denies the following risk factors for Strep sinusitis: unilateral facial pain, unilateral nasal discharge, poor response to decongestant, double sickening, tooth pain, Strep exposure, tender adenopathy, and absence of cough.    Preventive Screening-Counseling & Management  Alcohol-Tobacco     Alcohol drinks/day: 0     Alcohol Counseling: not indicated; patient does not  drink     Smoking Status: never     Tobacco Counseling: not indicated; no tobacco use  Hep-HIV-STD-Contraception     Hepatitis Risk: no risk noted     HIV Risk: no risk noted     STD Risk: no risk noted      Sexual History:  currently monogamous.        Drug Use:  never.        Blood Transfusions:  no.    Clinical Review Panels:  Prevention   Last Mammogram:  Normal Bilateral (12/12/2009)   Last Pap Smear:  Normal (12/08/2009)   Last Colonoscopy:  Normal (09/07/2008)  Immunizations   Last Tetanus Booster:  Tdap (10/18/2008)   Last Flu Vaccine:  given (04/05/2009)   Last Pneumovax:  Pneumovax (08/31/2008)  Lipid Management   Cholesterol:  105 (10/04/2009)   LDL (bad choesterol):  28 (10/04/2009)   HDL (good cholesterol):  50.80 (10/04/2009)  Diabetes Management   HgBA1C:  11.1 (05/26/2010)   Creatinine:  0.7 (05/26/2010)   Last Dilated Eye Exam:  normal (07/14/2009)   Last Foot Exam:  yes (08/21/2010)   Last Flu Vaccine:  given (04/05/2009)   Last Pneumovax:  Pneumovax (08/31/2008)  CBC   WBC:  14.4 (05/26/2010)   RBC:  4.37 (05/26/2010)   Hgb:  12.8 (05/26/2010)   Hct:  38.6 (05/26/2010)   Platelets:  425.0 (05/26/2010)   MCV  88.3 (05/26/2010)   MCHC  33.2 (05/26/2010)   RDW  13.6 (05/26/2010)   PMN:  57.0 (05/26/2010)   Lymphs:  31.4 (05/26/2010)   Monos:  9.8 (05/26/2010)   Eosinophils:  1.4 (05/26/2010)   Basophil:  0.4 (05/26/2010)  Complete Metabolic Panel   Glucose:  246 (05/26/2010)   Sodium:  139 (05/26/2010)   Potassium:  4.2 (05/26/2010)   Chloride:  100 (05/26/2010)   CO2:  28 (05/26/2010)   BUN:  19 (05/26/2010)   Creatinine:  0.7 (05/26/2010)   Albumin:  4.2 (05/26/2010)   Total Protein:  6.9 (05/26/2010)   Calcium:  9.8 (05/26/2010)   Total Bili:  0.6 (05/26/2010)   Alk Phos:  83 (05/26/2010)   SGPT (ALT):  27 (05/26/2010)   SGOT (AST):  24 (05/26/2010)   Medications Prior to Update: 1)  Zoloft 50 Mg  Tabs (Sertraline Hcl) ....  Once Daily 2)  Omeprazole 20 Mg  Tbec (Omeprazole) .... Take Two Capsules A Day 3)  Glucophage 1000 Mg  Tabs (Metformin Hcl) .... Take One Tablet Twice Daily 4)  Amaryl 4 Mg Tabs (Glimepiride) .... Once Daily 5)  Crestor 20 Mg  Tabs (Rosuvastatin Calcium) .... At Bedtime 6)  Niaspan 500 Mg  Tbcr (Niacin (Antihyperlipidemic)) .... 2 By Mouth At Bedtime 7)  Adult Aspirin Low Strength 81 Mg  Tbdp (Aspirin) .Marland Kitchen.. 1 By Mouth Once Daily 8)  Freestyle Lancets   Misc (Lancets) .... Accu Check Three Times A Day 9)  Freestyle Lite   Strp (Glucose Blood) .... Use As Directed 10)  Diovan Hct 160-25 Mg Tabs (Valsartan-Hydrochlorothiazide) .... Qd 11)  Bupropion Hcl 150 Mg Xr24h-Tab (Bupropion Hcl) .... Qam For Depression 12)  Actos 30 Mg Tabs (Pioglitazone Hcl) .... Once Daily For Diabetes 13)  Victoza 18 Mg/52ml Soln (Liraglutide) .... 1.8 Mg Per Day 14)  Pen Needles .... Pt Preference, To Use With Victoza  Current Medications (verified): 1)  Zoloft 50 Mg  Tabs (Sertraline Hcl) .... Once Daily 2)  Omeprazole 20 Mg  Tbec (Omeprazole) .... Take Two Capsules A Day 3)  Glucophage 1000 Mg  Tabs (Metformin Hcl) .... Take One Tablet Twice Daily 4)  Amaryl 4 Mg Tabs (Glimepiride) .... Once Daily 5)  Crestor 20 Mg  Tabs (Rosuvastatin Calcium) .... At Bedtime 6)  Niaspan 500 Mg  Tbcr (Niacin (Antihyperlipidemic)) .... 2 By Mouth At Bedtime 7)  Adult Aspirin Low Strength 81 Mg  Tbdp (Aspirin) .Marland Kitchen.. 1 By Mouth Once Daily 8)  Freestyle Lancets   Misc (Lancets) .... Accu Check Three Times A Day 9)  Freestyle Lite   Strp (Glucose Blood) .... Use As Directed 10)  Diovan Hct 160-25 Mg Tabs (Valsartan-Hydrochlorothiazide) .... Qd 11)  Bupropion Hcl 150 Mg Xr24h-Tab (Bupropion Hcl) .... Qam For Depression 12)  Actos 30 Mg Tabs (Pioglitazone Hcl) .... Once Daily For Diabetes 13)  Victoza 18 Mg/25ml Soln (Liraglutide) .... 1.8 Mg Per Day 14)  Pen Needles .... Pt Preference, To Use With Victoza 15)  Avelox 400 Mg Tabs  (Moxifloxacin Hcl) .... One By Mouth Once Daily For 5 Days 16)  Tussicaps 10-8 Mg Xr12h-Cap (Hydrocod Polst-Chlorphen Polst) .... One By Mouth Two Times A Day As Needed For Cough  Allergies (verified): No Known Drug Allergies  Past History:  Past Medical History: Last updated: 01/02/2010 Diabetes mellitus, type II Depression GERD Hyperlipidemia   Nephrolithiasis, hx of Hypertension  Past Surgical History: Last updated: 01/02/2010 C/S x 2 ? 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 Fam hx MI Family History Diabetes 1st degree relative Family History High cholesterol Family History Hypertension MGF--stroke Family History of Skin cancer Father died at 4 yoa from Aortic Dissection Family History of CAD Female 1st degree relative <50  Social History: Reviewed history from 01/02/2010 and no changes required. 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    Physical Exam  General:  alert, well-developed, well-nourished, well-hydrated, appropriate dress, normal appearance, healthy-appearing, cooperative to examination, and overweight-appearing.   Head:  normocephalic, atraumatic, and no abnormalities observed.   Eyes:  vision grossly intact, pupils equal, and no injection.   Ears:  R ear normal and L ear normal.   Nose:  External nasal examination shows no deformity or inflammation. Nasal mucosa are pink and moist without lesions or exudates. Mouth:  Oral mucosa and oropharynx without lesions or exudates.  Teeth in good repair. Neck:  supple, full ROM, no masses, no thyromegaly, no JVD, normal carotid upstroke, no carotid bruits, and no cervical lymphadenopathy.   Lungs:  normal respiratory effort, no intercostal retractions, no accessory muscle use, normal breath sounds, no dullness, no fremitus, no crackles, and no wheezes.   Heart:  normal rate, regular rhythm, no murmur, no gallop, no rub, and no JVD.   Abdomen:  soft, non-tender, decreased but present bowel sounds, no distention, no guarding, no appreciable hepatomegaly or splenomegaly.  incisional hernia to the left side of the incision. Msk:  No deformity or scoliosis noted of thoracic or lumbar spine.   Pulses:  R and L carotid,radial, dorsalis pedis and posterior tibial pulses are full and equal bilaterally Extremities:  trace left pedal edema and trace right pedal edema.   Neurologic:  No cranial nerve deficits noted. Station and gait are normal. Plantar reflexes are down-going bilaterally. DTRs are symmetrical throughout. Sensory, motor and coordinative functions appear intact. Skin:  turgor normal, color normal, no rashes, no suspicious lesions, no ulcerations, and no edema.   Cervical Nodes:  No lymphadenopathy noted Axillary Nodes:  no R axillary adenopathy and no L axillary adenopathy.   Psych:  Oriented X3, memory intact for recent and remote, normally interactive,  good eye contact, not anxious appearing, not depressed appearing, and not agitated.    Diabetes Management Exam:    Foot Exam (with socks and/or shoes not present):       Sensory-Pinprick/Light touch:          Left medial foot (L-4): normal          Left dorsal foot (L-5): normal          Left lateral foot (S-1): normal          Right medial foot (  L-4): normal          Right dorsal foot (L-5): normal          Right lateral foot (S-1): normal       Sensory-Monofilament:          Left foot: normal          Right foot: normal       Inspection:          Left foot: normal          Right foot: normal       Nails:          Left foot: normal          Right foot: normal   Impression & Recommendations:  Problem # 1:  COUGH (ICD-786.2) Assessment New will check for pneumonia Orders: T-2 View CXR (71020TC)  Problem # 2:  BRONCHITIS-ACUTE (ICD-466.0) Assessment: New  Her updated medication list for this problem includes:    Avelox 400 Mg Tabs (Moxifloxacin hcl) ..... One by mouth once daily for 5 days    Tussicaps 10-8 Mg Xr12h-cap (Hydrocod polst-chlorphen polst) ..... One by mouth two times a day as needed for cough  Take antibiotics and other medications as directed. Encouraged to push clear liquids, get enough rest, and take acetaminophen as needed. To be seen in 5-7 days if no improvement, sooner if worse.  Problem # 3:  HYPERTENSION (ICD-401.9) Assessment: Improved  Her updated medication list for this problem includes:    Diovan Hct 160-25 Mg Tabs (Valsartan-hydrochlorothiazide) ..... Qd  BP today: 142/88 Prior BP: 108/70 (05/26/2010)  Prior 10 Yr Risk Heart Disease: 8 % (05/26/2010)  Labs Reviewed: K+: 4.2 (05/26/2010) Creat: : 0.7 (05/26/2010)   Chol: 105 (10/04/2009)   HDL: 50.80 (10/04/2009)   LDL: 28 (10/04/2009)   TG: 131.0 (10/04/2009)  Complete Medication List: 1)  Zoloft 50 Mg Tabs (Sertraline hcl) .... Once daily 2)  Omeprazole 20 Mg Tbec (Omeprazole) ....  Take two capsules a day 3)  Glucophage 1000 Mg Tabs (Metformin hcl) .... Take one tablet twice daily 4)  Amaryl 4 Mg Tabs (Glimepiride) .... Once daily 5)  Crestor 20 Mg Tabs (Rosuvastatin calcium) .... At bedtime 6)  Niaspan 500 Mg Tbcr (Niacin (antihyperlipidemic)) .... 2 by mouth at bedtime 7)  Adult Aspirin Low Strength 81 Mg Tbdp (Aspirin) .Marland Kitchen.. 1 by mouth once daily 8)  Freestyle Lancets Misc (Lancets) .... Accu check three times a day 9)  Freestyle Lite Strp (Glucose blood) .... Use as directed 10)  Diovan Hct 160-25 Mg Tabs (Valsartan-hydrochlorothiazide) .... Qd 11)  Bupropion Hcl 150 Mg Xr24h-tab (Bupropion hcl) .... Qam for depression 12)  Actos 30 Mg Tabs (Pioglitazone hcl) .... Once daily for diabetes 13)  Victoza 18 Mg/41ml Soln (Liraglutide) .... 1.8 mg per day 14)  Pen Needles  .... Pt preference, to use with victoza 15)  Avelox 400 Mg Tabs (Moxifloxacin hcl) .... One by mouth once daily for 5 days 16)  Tussicaps 10-8 Mg Xr12h-cap (Hydrocod polst-chlorphen polst) .... One by mouth two times a day as needed for cough  Other Orders: Ophthalmology Referral (Ophthalmology)  Patient Instructions: 1)  Please schedule a follow-up appointment in 1 month. 2)  Take your antibiotic as prescribed until ALL of it is gone, but stop if you develop a rash or swelling and contact our office as soon as possible. 3)  Acute bronchitis symptoms for less than 10 days are not helped by antibiotics. take over the counter cough medications.  call if no improvment in  5-7 days, sooner if increasing cough, fever, or new symptoms( shortness of breath, chest pain). Prescriptions: TUSSICAPS 10-8 MG XR12H-CAP (HYDROCOD POLST-CHLORPHEN POLST) One by mouth two times a day as needed for cough  #30 x 0   Entered and Authorized by:   Etta Grandchild MD   Signed by:   Etta Grandchild MD on 08/21/2010   Method used:   Print then Give to Patient   RxID:   [id number] AVELOX 400 MG TABS (MOXIFLOXACIN HCL) One  by mouth once daily for 5 days  #5 x 0   Entered and Authorized by:   Etta Grandchild MD   Signed by:   Etta Grandchild MD on 08/21/2010   Method used:   Samples Given   RxID:   [id number]    Orders Added: 1)  T-2 View CXR [71020TC] 2)  Ophthalmology Referral [Ophthalmology] 3)  Est. Patient Level IV [[address]]

## 2010-08-31 NOTE — Progress Notes (Signed)
  Phone Note Call from Patient Call back at [facility] Phone [phone]   Caller: ---[phone]--cell Call For: Dr Yetta Barre Summary of Call: Pt feels hoarsenss, sinus drainage, bad headache, coughing up green phelgm from sinus, no fever, real tired. 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 We have no openings in the clinic today,please advise. Initial call taken by: Verdell Face,  August 21, 2010 8:49 AM  Follow-up for Phone Call        we can work her in Follow-up by: Etta Grandchild MD,  August 21, 2010 9:01 AM  Additional Follow-up for Phone Call Additional follow up Details #1::        Appt at 1:30pm today, pt aware Additional Follow-up by: Verdell Face,  August 21, 2010 10:50 AM

## 2010-09-05 ENCOUNTER — Encounter: Payer: Self-pay | Admitting: Internal Medicine

## 2010-09-05 ENCOUNTER — Other Ambulatory Visit: Payer: 59

## 2010-09-05 ENCOUNTER — Other Ambulatory Visit: Payer: Self-pay | Admitting: Internal Medicine

## 2010-09-05 ENCOUNTER — Ambulatory Visit (INDEPENDENT_AMBULATORY_CARE_PROVIDER_SITE_OTHER): Payer: 59 | Admitting: Internal Medicine

## 2010-09-05 ENCOUNTER — Telehealth: Payer: Self-pay | Admitting: Internal Medicine

## 2010-09-05 DIAGNOSIS — E119 Type 2 diabetes mellitus without complications: Secondary | ICD-10-CM

## 2010-09-05 DIAGNOSIS — I10 Essential (primary) hypertension: Secondary | ICD-10-CM

## 2010-09-05 DIAGNOSIS — D72829 Elevated white blood cell count, unspecified: Secondary | ICD-10-CM

## 2010-09-05 LAB — CBC WITH DIFFERENTIAL/PLATELET
Eosinophils Relative: 3.3 % (ref 0.0–5.0)
Lymphocytes Relative: 26.9 % (ref 12.0–46.0)
MCV: 88.7 fl (ref 78.0–100.0)
Monocytes Absolute: 1.2 10*3/uL — ABNORMAL HIGH (ref 0.1–1.0)
Monocytes Relative: 7 % (ref 3.0–12.0)
Neutrophils Relative %: 61.1 % (ref 43.0–77.0)
Platelets: 504 10*3/uL — ABNORMAL HIGH (ref 150.0–400.0)
RBC: 4.16 Mil/uL (ref 3.87–5.11)
WBC: 17.1 10*3/uL — ABNORMAL HIGH (ref 4.5–10.5)

## 2010-09-05 LAB — HEMOGLOBIN A1C: Hgb A1c MFr Bld: 7.2 % — ABNORMAL HIGH (ref 4.6–6.5)

## 2010-09-05 LAB — BASIC METABOLIC PANEL
BUN: 23 mg/dL (ref 6–23)
Calcium: 9.8 mg/dL (ref 8.4–10.5)
Creatinine, Ser: 0.8 mg/dL (ref 0.4–1.2)
GFR: 76.59 mL/min (ref >60.00)

## 2010-09-05 LAB — HM DIABETES FOOT EXAM

## 2010-09-12 NOTE — Letter (Signed)
Summary: Results Follow-up Letter  [facility]  [address] [place], [place] [address]   Phone: [phone]  Fax: [phone]    09/05/2010  [address] [place], [place]  [address]  [place]  Dear Ms. Bartoli,   The following are the results of your recent test(s):  Test     Result     Blood sugars   much better CBC       WBC is a little higher   _________________________________________________________  Please call for an appointment as directed _________________________________________________________ _________________________________________________________ _________________________________________________________  Sincerely,  Sanda Linger MD [facility]

## 2010-09-12 NOTE — Assessment & Plan Note (Signed)
Summary: 3 MOS FUJF/U #. CD   Vital Signs:  Patient profile:   60 year old female Menstrual status:  postmenopausal Height:      63 inches Weight:      179 pounds BMI:     31.82 O2 Sat:      98 % on Room air Temp:     98.7 degrees F oral Pulse rate:   76 / minute Pulse rhythm:   regular Resp:     16 per minute BP sitting:   110 / 62  (left arm) Cuff size:   regular  Vitals Entered By: Lanier Prude, Beverly Gust) (September 05, 2010 8:22 AM)  Nutrition Counseling: Patient's BMI is greater than 25 and therefore counseled on weight management options.  O2 Flow:  Room air CC: 3 mo f/u  Is Patient Diabetic? Yes Did you bring your meter with you today? No Pain Assessment Patient in pain? no      Comments pt is not taking Tussicaps. She is requesting shorter needles for Victoza   Primary Care Provider:  Etta Grandchild MD  CC:  3 mo f/u .  History of Present Illness:  Follow-Up Visit      This is a 60 year old woman who presents for Follow-up visit.  The patient denies chest pain, palpitations, dizziness, syncope, low blood sugar symptoms, high blood sugar symptoms, edema, SOB, DOE, PND, and orthopnea.  Since the last visit the patient notes no new problems or concerns.  The patient reports taking meds as prescribed, monitoring BP, monitoring blood sugars, and dietary compliance.  When questioned about possible medication side effects, the patient notes none.    Preventive Screening-Counseling & Management  Alcohol-Tobacco     Alcohol drinks/day: 0     Alcohol Counseling: not indicated; patient does not drink     Smoking Status: never     Tobacco Counseling: not indicated; no tobacco use  Hep-HIV-STD-Contraception     Hepatitis Risk: no risk noted     HIV Risk: no risk noted     STD Risk: no risk noted      Sexual History:  currently monogamous.        Drug Use:  never.        Blood Transfusions:  no.    Clinical Review Panels:  Prevention   Last Mammogram:  Normal  Bilateral (12/12/2009)   Last Pap Smear:  Normal (12/08/2009)   Last Colonoscopy:  Normal (09/07/2008)  Immunizations   Last Tetanus Booster:  Tdap (10/18/2008)   Last Flu Vaccine:  given (04/05/2009)   Last Pneumovax:  Pneumovax (08/31/2008)  Lipid Management   Cholesterol:  105 (10/04/2009)   LDL (bad choesterol):  28 (10/04/2009)   HDL (good cholesterol):  50.80 (10/04/2009)  Diabetes Management   HgBA1C:  11.1 (05/26/2010)   Creatinine:  0.7 (05/26/2010)   Last Dilated Eye Exam:  normal (07/14/2009)   Last Foot Exam:  yes (09/05/2010)   Last Flu Vaccine:  given (04/05/2009)   Last Pneumovax:  Pneumovax (08/31/2008)  CBC   WBC:  14.4 (05/26/2010)   RBC:  4.37 (05/26/2010)   Hgb:  12.8 (05/26/2010)   Hct:  38.6 (05/26/2010)   Platelets:  425.0 (05/26/2010)   MCV  88.3 (05/26/2010)   MCHC  33.2 (05/26/2010)   RDW  13.6 (05/26/2010)   PMN:  57.0 (05/26/2010)   Lymphs:  31.4 (05/26/2010)   Monos:  9.8 (05/26/2010)   Eosinophils:  1.4 (05/26/2010)   Basophil:  0.4 (05/26/2010)  Complete Metabolic Panel   Glucose:  246 (05/26/2010)   Sodium:  139 (05/26/2010)   Potassium:  4.2 (05/26/2010)   Chloride:  100 (05/26/2010)   CO2:  28 (05/26/2010)   BUN:  19 (05/26/2010)   Creatinine:  0.7 (05/26/2010)   Albumin:  4.2 (05/26/2010)   Total Protein:  6.9 (05/26/2010)   Calcium:  9.8 (05/26/2010)   Total Bili:  0.6 (05/26/2010)   Alk Phos:  83 (05/26/2010)   SGPT (ALT):  27 (05/26/2010)   SGOT (AST):  24 (05/26/2010)   Medications Prior to Update: 1)  Zoloft 50 Mg  Tabs (Sertraline Hcl) .... Once Daily 2)  Omeprazole 20 Mg  Tbec (Omeprazole) .... Take Two Capsules A Day 3)  Glucophage 1000 Mg  Tabs (Metformin Hcl) .... Take One Tablet Twice Daily 4)  Amaryl 4 Mg Tabs (Glimepiride) .... Once Daily 5)  Crestor 20 Mg  Tabs (Rosuvastatin Calcium) .... At Bedtime 6)  Niaspan 500 Mg  Tbcr (Niacin (Antihyperlipidemic)) .... 2 By Mouth At Bedtime 7)  Adult Aspirin Low  Strength 81 Mg  Tbdp (Aspirin) .Marland Kitchen.. 1 By Mouth Once Daily 8)  Freestyle Lancets   Misc (Lancets) .... Accu Check Three Times A Day 9)  Freestyle Lite   Strp (Glucose Blood) .... Use As Directed 10)  Diovan Hct 160-25 Mg Tabs (Valsartan-Hydrochlorothiazide) .... Qd 11)  Bupropion Hcl 150 Mg Xr24h-Tab (Bupropion Hcl) .... Qam For Depression 12)  Actos 30 Mg Tabs (Pioglitazone Hcl) .... Once Daily For Diabetes 13)  Victoza 18 Mg/61ml Soln (Liraglutide) .... 1.8 Mg Per Day 14)  Pen Needles .... Pt Preference, To Use With Victoza 15)  Tussicaps 10-8 Mg Xr12h-Cap (Hydrocod Polst-Chlorphen Polst) .... One By Mouth Two Times A Day As Needed For Cough  Current Medications (verified): 1)  Zoloft 50 Mg  Tabs (Sertraline Hcl) .... Once Daily 2)  Omeprazole 20 Mg  Tbec (Omeprazole) .... Take Two Capsules A Day 3)  Glucophage 1000 Mg  Tabs (Metformin Hcl) .... Take One Tablet Twice Daily 4)  Amaryl 4 Mg Tabs (Glimepiride) .... Once Daily 5)  Crestor 20 Mg  Tabs (Rosuvastatin Calcium) .... At Bedtime 6)  Niaspan 500 Mg  Tbcr (Niacin (Antihyperlipidemic)) .... 2 By Mouth At Bedtime 7)  Adult Aspirin Low Strength 81 Mg  Tbdp (Aspirin) .Marland Kitchen.. 1 By Mouth Once Daily 8)  Freestyle Lancets   Misc (Lancets) .... Accu Check Three Times A Day 9)  Freestyle Lite   Strp (Glucose Blood) .... Use As Directed 10)  Diovan Hct 160-25 Mg Tabs (Valsartan-Hydrochlorothiazide) .... Qd 11)  Bupropion Hcl 150 Mg Xr24h-Tab (Bupropion Hcl) .... Qam For Depression 12)  Actos 30 Mg Tabs (Pioglitazone Hcl) .... Once Daily For Diabetes 13)  Victoza 18 Mg/102ml Soln (Liraglutide) .... 1.8 Mg Per Day 14)  Tussicaps 10-8 Mg Xr12h-Cap (Hydrocod Polst-Chlorphen Polst) .... One By Mouth Two Times A Day As Needed For Cough 15)  Exel Pen Needles 1/3" 31g X 8 Mm Misc (Insulin Pen Needle) .... Use Once Daily With Victoza  Allergies (verified): No Known Drug Allergies  Past History:  Past Medical History: Last updated: 01/02/2010 Diabetes  mellitus, type II Depression GERD Hyperlipidemia   Nephrolithiasis, hx of Hypertension  Past Surgical History: Last updated: 01/02/2010 C/S x 2 ? appy with C/S T&A T.L Colonoscopy-tics repeat 63yrs-9/29/200E3 EGD Gallstones -04/2005 Rt knee-11/2005 Splenectomy   Family History: Last updated: 07/12/2008 Fam hx MI Family History Diabetes 1st degree relative Family History High cholesterol Family History Hypertension MGF--stroke Family History  of Skin cancer Father died at 65 yoa from Aortic Dissection Family History of CAD Female 1st degree relative <50  Social History: Last updated: 01/02/2010 Married Alcohol use-no Occupation: CSR  Never Smoked Drug use-no Regular exercise-no  Risk Factors: Alcohol Use: 0 (09/05/2010) Exercise: no (05/26/2008)  Risk Factors: Smoking Status: never (09/05/2010)  Family History: Reviewed history from 07/12/2008 and no changes required. Fam hx MI Family History Diabetes 1st degree relative Family History High cholesterol Family History Hypertension MGF--stroke Family History of Skin cancer Father died at 65 yoa from Aortic Dissection Family History of CAD Female 1st degree relative <50  Social History: Reviewed history from 01/02/2010 and no changes required. 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   Endo:  Denies cold intolerance, excessive hunger, excessive thirst, excessive urination, heat intolerance, polyuria, and weight change.  Physical Exam  General:  alert, well-developed, well-nourished, well-hydrated, appropriate dress, normal appearance,  healthy-appearing, cooperative to examination, and overweight-appearing.   Head:  normocephalic, atraumatic, and no abnormalities observed.   Eyes:  vision grossly intact, pupils equal, and no injection.   Mouth:  Oral mucosa and oropharynx without lesions or exudates.  Teeth in good repair. Neck:  supple, full ROM, no masses, no thyromegaly, no JVD, normal carotid upstroke, no carotid bruits, and no cervical lymphadenopathy.   Lungs:  normal respiratory effort, no intercostal retractions, no accessory muscle use, normal breath sounds, no dullness, no fremitus, no crackles, and no wheezes.   Heart:  normal rate, regular rhythm, no murmur, no gallop, no rub, and no JVD.   Abdomen:  soft, non-tender, decreased but present bowel sounds, no distention, no guarding, no appreciable hepatomegaly or splenomegaly.  incisional hernia to the left side of the incision. Msk:  No deformity or scoliosis noted of thoracic or lumbar spine.   Pulses:  R and L carotid,radial, dorsalis pedis and posterior tibial pulses are full and equal bilaterally Extremities:  trace left pedal edema and trace right pedal edema.   Neurologic:  No cranial nerve deficits noted. Station and gait are normal. Plantar reflexes are down-going bilaterally. DTRs are symmetrical throughout. Sensory, motor and coordinative functions appear intact. Skin:  turgor normal, color normal, no rashes, no suspicious lesions, no ulcerations, and no edema.   Cervical Nodes:  No lymphadenopathy noted Psych:  Oriented X3, memory intact for recent and remote, normally interactive, good eye contact, not anxious appearing, not depressed appearing, and not agitated.    Diabetes Management Exam:    Foot Exam (with socks and/or shoes not present):       Sensory-Pinprick/Light touch:          Left medial foot (L-4): normal          Left dorsal foot (L-5): normal          Left lateral foot (S-1): normal          Right medial foot (L-4): normal          Right  dorsal foot (L-5): normal          Right lateral foot (S-1): normal       Sensory-Monofilament:          Left foot: normal  Right foot: normal       Inspection:          Left foot: normal          Right foot: normal       Nails:          Left foot: normal          Right foot: normal   Impression & Recommendations:  Problem # 1:  LEUKOCYTOSIS (ICD-288.60) Assessment Unchanged  Orders: TLB-BMP (Basic Metabolic Panel-BMET) (80048-METABOL) TLB-CBC Platelet - w/Differential (85025-CBCD) TLB-A1C / Hgb A1C (Glycohemoglobin) (83036-A1C)  Problem # 2:  HYPERTENSION (ICD-401.9) Assessment: Improved  Her updated medication list for this problem includes:    Diovan Hct 160-25 Mg Tabs (Valsartan-hydrochlorothiazide) ..... Qd  Orders: TLB-BMP (Basic Metabolic Panel-BMET) (80048-METABOL) TLB-CBC Platelet - w/Differential (85025-CBCD) TLB-A1C / Hgb A1C (Glycohemoglobin) (83036-A1C)  BP today: 110/62 Prior BP: 142/88 (08/21/2010)  Prior 10 Yr Risk Heart Disease: 8 % (05/26/2010)  Labs Reviewed: K+: 4.2 (05/26/2010) Creat: : 0.7 (05/26/2010)   Chol: 105 (10/04/2009)   HDL: 50.80 (10/04/2009)   LDL: 28 (10/04/2009)   TG: 131.0 (10/04/2009)  Problem # 3:  DIABETES MELLITUS, TYPE II (ICD-250.00) Assessment: Unchanged  Her updated medication list for this problem includes:    Glucophage 1000 Mg Tabs (Metformin hcl) .Marland Kitchen... Take one tablet twice daily    Amaryl 4 Mg Tabs (Glimepiride) ..... Once daily    Adult Aspirin Low Strength 81 Mg Tbdp (Aspirin) .Marland Kitchen... 1 by mouth once daily    Diovan Hct 160-25 Mg Tabs (Valsartan-hydrochlorothiazide) ..... Qd    Actos 30 Mg Tabs (Pioglitazone hcl) ..... Once daily for diabetes    Victoza 18 Mg/97ml Soln (Liraglutide) .Marland Kitchen... 1.8 mg per day  Orders: TLB-BMP (Basic Metabolic Panel-BMET) (80048-METABOL) TLB-CBC Platelet - w/Differential (85025-CBCD) TLB-A1C / Hgb A1C (Glycohemoglobin) (83036-A1C)  Labs Reviewed: Creat: 0.7 (05/26/2010)      Last Eye Exam: normal (07/14/2009) Reviewed HgBA1c results: 11.1 (05/26/2010)  9.8 (02/02/2010)  Complete Medication List: 1)  Zoloft 50 Mg Tabs (Sertraline hcl) .... Once daily 2)  Omeprazole 20 Mg Tbec (Omeprazole) .... Take two capsules a day 3)  Glucophage 1000 Mg Tabs (Metformin hcl) .... Take one tablet twice daily 4)  Amaryl 4 Mg Tabs (Glimepiride) .... Once daily 5)  Crestor 20 Mg Tabs (Rosuvastatin calcium) .... At bedtime 6)  Niaspan 500 Mg Tbcr (Niacin (antihyperlipidemic)) .... 2 by mouth at bedtime 7)  Adult Aspirin Low Strength 81 Mg Tbdp (Aspirin) .Marland Kitchen.. 1 by mouth once daily 8)  Freestyle Lancets Misc (Lancets) .... Accu check three times a day 9)  Freestyle Lite Strp (Glucose blood) .... Use as directed 10)  Diovan Hct 160-25 Mg Tabs (Valsartan-hydrochlorothiazide) .... Qd 11)  Bupropion Hcl 150 Mg Xr24h-tab (Bupropion hcl) .... Qam for depression 12)  Actos 30 Mg Tabs (Pioglitazone hcl) .... Once daily for diabetes 13)  Victoza 18 Mg/54ml Soln (Liraglutide) .... 1.8 mg per day 14)  Exel Pen Needles 1/3" 31g X 8 Mm Misc (Insulin pen needle) .... Use once daily with victoza  Patient Instructions: 1)  Please schedule a follow-up appointment in 4 months. 2)  It is important that you exercise regularly at least 20 minutes 5 times a week. If you develop chest pain, have severe difficulty breathing, or feel very tired , stop exercising immediately and seek medical attention. 3)  You need to lose weight. Consider a lower calorie diet and regular exercise.  4)  Check your blood sugars regularly. If your readings are usually  above 200 or below 70 you should contact our office. 5)  It is important that your Diabetic A1c level is checked every 3 months. 6)  See your eye doctor yearly to check for diabetic eye damage. 7)  Check your feet each night for sore areas, calluses or signs of infection. 8)  Check your Blood Pressure regularly. If it is above 130/80: you should make an  appointment. Prescriptions: EXEL PEN NEEDLES 1/3" 31G X 8 MM MISC (INSULIN PEN NEEDLE) Use once daily with victoza  #90 x 3   Entered and Authorized by:   Etta Grandchild MD   Signed by:   Etta Grandchild MD on 09/05/2010   Method used:   Electronically to        [facility]  [address] [id number] (retail)       [address]       [place], [place]  [address]       Ph: [phone] or [phone]       Fax: [phone]   RxID:   [id number]    Orders Added: 1)  TLB-BMP (Basic Metabolic Panel-BMET) [80048-METABOL] 2)  TLB-CBC Platelet - w/Differential [85025-CBCD] 3)  TLB-A1C / Hgb A1C (Glycohemoglobin) [83036-A1C] 4)  Est. Patient Level III [36644]

## 2010-09-18 ENCOUNTER — Encounter: Payer: 59 | Admitting: Oncology

## 2010-10-04 ENCOUNTER — Other Ambulatory Visit: Payer: Self-pay | Admitting: Oncology

## 2010-10-04 ENCOUNTER — Encounter (HOSPITAL_BASED_OUTPATIENT_CLINIC_OR_DEPARTMENT_OTHER): Payer: 59 | Admitting: Oncology

## 2010-10-04 DIAGNOSIS — D72829 Elevated white blood cell count, unspecified: Secondary | ICD-10-CM

## 2010-10-04 DIAGNOSIS — D473 Essential (hemorrhagic) thrombocythemia: Secondary | ICD-10-CM

## 2010-10-04 DIAGNOSIS — D696 Thrombocytopenia, unspecified: Secondary | ICD-10-CM

## 2010-10-04 DIAGNOSIS — E119 Type 2 diabetes mellitus without complications: Secondary | ICD-10-CM

## 2010-10-04 LAB — CBC WITH DIFFERENTIAL/PLATELET
BASO%: 0.5 % (ref 0.0–2.0)
Eosinophils Absolute: 0.6 10*3/uL — ABNORMAL HIGH (ref 0.0–0.5)
MONO#: 1.7 10*3/uL — ABNORMAL HIGH (ref 0.1–0.9)
MONO%: 10.6 % (ref 0.0–14.0)
NEUT#: 6.2 10*3/uL (ref 1.5–6.5)
RBC: 4.33 10*6/uL (ref 3.70–5.45)
RDW: 13.5 % (ref 11.2–14.5)
WBC: 15.7 10*3/uL — ABNORMAL HIGH (ref 3.9–10.3)
nRBC: 0 % (ref 0–0)

## 2010-10-04 LAB — COMPREHENSIVE METABOLIC PANEL
ALT: 20 U/L (ref 0–35)
BUN: 14 mg/dL (ref 6–23)
CO2: 25 mEq/L (ref 19–32)
Creatinine, Ser: 0.69 mg/dL (ref 0.40–1.20)
Total Bilirubin: 0.2 mg/dL — ABNORMAL LOW (ref 0.3–1.2)

## 2010-10-04 LAB — MORPHOLOGY
PLT EST: INCREASED
RBC Comments: NORMAL

## 2010-10-04 LAB — LACTATE DEHYDROGENASE: LDH: 126 U/L (ref 94–250)

## 2010-10-09 ENCOUNTER — Other Ambulatory Visit: Payer: Self-pay | Admitting: Oncology

## 2010-10-09 DIAGNOSIS — D72829 Elevated white blood cell count, unspecified: Secondary | ICD-10-CM

## 2010-10-09 DIAGNOSIS — D75839 Thrombocytosis, unspecified: Secondary | ICD-10-CM

## 2010-10-09 DIAGNOSIS — D473 Essential (hemorrhagic) thrombocythemia: Secondary | ICD-10-CM

## 2010-10-19 ENCOUNTER — Other Ambulatory Visit (HOSPITAL_COMMUNITY): Payer: 59

## 2010-11-10 NOTE — Discharge Summary (Signed)
Margaret Norman, Margaret Norman              ACCOUNT NO.:  [id number]   MEDICAL RECORD NO.:  [id number]          PATIENT TYPE:  INP   LOCATION:  5727                         FACILITY:  [facility]   PHYSICIAN:  Margaret Norman, M.D. LHCDATE OF BIRTH:  [birth year]   DATE OF ADMISSION:  05/20/2005  DATE OF DISCHARGE:  05/24/2005                                 DISCHARGE SUMMARY   DISCHARGE DIAGNOSES:  1.  Cholelithiasis/choledocholithiasis.  2.  Status post endoscopic retrograde cholangiopancreatography/stent      sphincterotomy May 22, 2005, and laparoscopic cholecystectomy plus      liver biopsies May 23, 2005.   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  This was accompanied by elevated LFTs, and an ultrasound  confirmed stones and suggested a common bile duct obstruction.  The patient  was admitted for further workup as well as a GI consult.   PAST MEDICAL HISTORY:  1.  Remote tonsillectomy.  2.  C-section x2.  3.  Hyperglycemia.  4.  GERD.  5.  History of gallstones.   COURSE OF HOSPITALIZATION:  CHOLELITHIASIS/CHOLEDOCHOLITHIASIS:  The patient  was admitted and was evaluated by the GI team.  The patient was seen by Dr.  Russella Dar.  An ERCP was performed, which included a sphincterotomy with stone  removal.  Subsequently a surgical consult was obtained and the patient was  seen by Dr. Luan Pulling.  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   The patient is currently tolerating p.o.'s.  Vital signs are stable.  Plan  to discharge the patient to home and have her follow up with surgery and her  primary care as an outpatient.   MEDICATIONS AT DISCHARGE:  1.  Vicodin as needed for pain.  2.  Nexium 40 mg p.o. daily.  3.  Aspirin 81 mg p.o. daily.  The patient is instructed to wait one week      prior to resuming aspirin.   LABORATORY DATA AT DISCHARGE:  BUN 6, creatinine 0.6.  AST 64, ALT 209.   FOLLOW-UP:  The patient is to follow up with Dr. Luan Pulling in two to three  weeks and call for an appointment.  In addition, the patient is to follow up  with Loreen Freud, M.D., in one to two weeks and instructed to call for an  appointment.  In addition, she is instructed to contact surgery should she  develop fever over 101.5, redness, drainage from incisions.      Melissa S. Peggyann Juba, NP      Margaret Norman, M.D. [facility]  Electronically Signed    MSO/MEDQ  D:  05/24/2005  T:  05/25/2005  Job:  [id number]   cc:   Loreen Freud, M.D.  [address]  [place] [address]  Vikki Ports, MD  [address]  [place]  [place] [address]

## 2010-11-10 NOTE — Op Note (Signed)
NAMEJENEE, Margaret Norman              ACCOUNT NO.:  [id number]   MEDICAL RECORD NO.:  [id number]          PATIENT TYPE:  INP   LOCATION:  5727                         FACILITY:  [facility]   PHYSICIAN:  Vikki Ports, MDDATE OF BIRTH:  [birth year]   DATE OF PROCEDURE:  05/23/2005  DATE OF DISCHARGE:                                 OPERATIVE REPORT   PREOPERATIVE DIAGNOSIS:  Choledocholithiasis and elevated liver function  tests.   POSTOPERATIVE DIAGNOSIS:  Choledocholithiasis and elevated liver function  tests.   PROCEDURE:  Laparoscopic cholecystectomy and percutaneous liver biopsy.   SURGEON:  Vikki Ports, M.D.   ASSISTANT:  Thornton Park. Daphine Deutscher, M.D.   ANESTHESIA:  General.   DESCRIPTION OF PROCEDURE:  The patient was taken to the operating room and  placed in a supine position.  After adequate general anesthesia was induced,  the abdomen was prepped and draped in the normal sterile fashion.  Using a  transverse infraumbilical incision, I dissected down to the fascia.  The  fascia was opened vertically.  An 0 Vicryl purse-string suture was placed  around the fascial defect.  A Hasson trocar was placed in the abdomen.  The  abdomen was insufflated with continuous flow carbon dioxide.  Under direct  visualization, an 11 mm trocar was placed in the subxiphoid region and two 5  mm trocars were placed in the right abdomen.  The gallbladder was identified  and retracted cephalad.  Dissection at the infundibulum and taking down some  adhesions easily visualized the cystic duct.  Also, using the ERCP showing a  long cystic duct, I was comfortable with the critical view that I was  visualizing the junction of the gallbladder and cystic duct and it was  triply clipped and divided.  The cystic artery was identified in a similar  fashion, triply clipped and divided.  The gallbladder was taken off the  gallbladder bed using Bovie electrocautery and removed through the umbilical  port.  A percutaneous liver biopsy was performed through a separate stab  wound in the right upper quadrant and sent for pathologic evaluation.  Adequate hemostasis was insured using Bovie electrocautery.  The  pneumoperitoneum was released.  The infraumbilical fascial defect was closed  with the 0 Vicryl purse-string suture.  The skin incisions were closed with  subcuticular 4-0 Monocryl.  Steri-Strips and sterile dressings were applied.  The patient tolerated the procedure well and went to the PACU in good  condition.      Vikki Ports, MD  Electronically Signed     KRH/MEDQ  D:  05/23/2005  T:  05/23/2005  Job:  [id number]

## 2010-11-10 NOTE — H&P (Signed)
Margaret Norman, Margaret Norman              ACCOUNT NO.:  [id number]   MEDICAL RECORD NO.:  [id number]          PATIENT TYPE:  INP   LOCATION:  5727                         FACILITY:  [facility]   PHYSICIAN:  Rosalyn Gess. Norins, M.D. [facility] OF BIRTH:  17-Feb-1951   DATE OF ADMISSION:  05/21/2005  DATE OF DISCHARGE:                                HISTORY & PHYSICAL   DATE OF ADMISSION:  May 21, 2005, 0130 hours.   CHIEF COMPLAINT:  Right upper quadrant abdominal pain with radiation to her  back.   HISTORY OF PRESENT ILLNESS:  Margaret Norman is a 60 year old married white  female with known cholelithiasis by ultrasound in June 2006.  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  The patient reports the onset  of biliary colic on Thursday, May 17, 2005.  Over the next several  days, this has become progressively more intense, needing to her  presentation to the emergency department, where it revealed that she had  elevated LFTs.  Ultrasound confirmed stones, and she now has common bile  duct and intrahepatic biliary dilatation, suggesting a common bile duct  obstruction.  The patient is now admitted for supportive care, GI consult,  with anticipated ERCP and possible laparoscopic cholecystectomy this  admission.   PAST MEDICAL HISTORY:  Surgical:  Tonsillectomy remote, cesarean section x2.  Medical:  Usual childhood diseases, history of hyperglycemia, history of  GERD, history of gallstones.   MEDICATIONS:  1.  Nexium 40 mg daily.  2.  Aspirin 81 mg daily.   HABITS:  Tobacco:  None.  Alcohol:  None.   ALLERGIES:  No known drug allergies.   FAMILY HISTORY:  Father died at age 60 of a MI.  Mother died at age 3 of GI-  related disease.  The patient has one sister and one brother with  hypertension, one brother who is well.  Family history is negative for  breast cancer, colon cancer.  Positive for hypertension and CAD, diabetes in  the maternal kinship.   SOCIAL  HISTORY:  The patient works as a Engineer, water.  She has been married for 34 years.  She has two daughters,  age 56 and 62, one grandson age 71.  She reports that her marriage is  stable, and things are going relatively well, but her work is stressful.   REVIEW OF SYSTEMS:  Significant for blurred vision.  No pulmonary,  cardiovascular, or GYN complaints.   HEALTH MAINTENANCE:  Last mammogram in 2005.  Last Pap smear in 2005.  Last  colonoscopy in 2004.   PHYSICAL EXAMINATION:  VITAL SIGNS:  Temperature 97.6, blood pressure  127/62, heart rate 122, respirations 18.  GENERAL APPEARANCE:  Obese white female, who is in no acute distress at my  exam.  HEENT:  Normocephalic/atraumatic.  EOCs and TMs are unremarkable.  Pharynx -  native dentition in good repair.  No buccal or palatal lesions noted.  Conjunctivae and sclerae are clear.  Pupils are equal, round and reactive to  light and accommodation.  Funduscopic exam was not performed.  NECK:  Supple without thyromegaly.  NODES:  No adenopathy is noted in the cervical or supraclavicular regions.  CHEST:  No CVA tenderness.  LUNGS:  Clear to auscultation and percussion.  BREAST EXAM:  Deferred to outpatient medicine.  CARDIOVASCULAR:  2+ radial pulses, quiet precordium with a regular rate and  rhythm.  No murmurs, rubs or gallops.  No JVD.  No carotid bruits.  ABDOMEN:  Obese.  She had a positive bowel sounds in all four quadrants.  She had tenderness in the epigastrium and right upper quadrant without  rebound or guarding.  Size prevented palpation of liver edge or  Courvoisier's sign.  PELVIC/RECTAL EXAM:  Deferred to outpatient medicine.  EXTREMITIES:  Without cyanosis, clubbing or edema.  No deformities were  noted.   LABORATORY DATA:  UA was negative.  Hemoglobin 13.4 g, white count 13.6, 78%  segs, 14% lymphs, 7% monos, platelet count 274,000.  Chemistries showed a  sodium 138, potassium 3.9, chloride 109,  CO2 24. BUN 10, creatinine 0.7,  glucose 146, SGOT 233, SGPT 381, alkaline phosphatase 231, total bilirubin  1.8, lipase 59.  Repeat ultrasound showed gallstones, intrahepatic and  common bile duct biliary dilatation, stable pancreatic pseudocyst.   ASSESSMENT/PLAN:  Cholelithiasis with common bile duct obstruction.  The  patient is afebrile.  She does not appear toxic.  I suspect that her white  count is mildly elevated secondary to demarginization with pain, but not  infection.   PLAN:  Regular hospital admission.  Demerol 25 mg IV q.6h. p.r.n. for pain,  Phenergan 12.5 mg IV q.6h. p.r.n. for pain.  GI consult for later today for  consideration of ERCP.  The patient needs a general surgical consult for  possible laparoscopic cholecystectomy.           ______________________________  Rosalyn Gess Norins, M.D. [facility]     MEN/MEDQ  D:  05/21/2005  T:  05/21/2005  Job:  [id number]   cc:   [name], M.D.  [address] [place], [place] [address]   Barbette Hair. Arlyce Dice, M.D. [facility]  [address]  [place]  [place] [address]

## 2010-11-10 NOTE — Consult Note (Signed)
Margaret Norman, Margaret Norman              ACCOUNT NO.:  [id number]   MEDICAL RECORD NO.:  [id number]          PATIENT TYPE:  INP   LOCATION:  5727                         FACILITY:  [facility]   PHYSICIAN:  Guy Franco, P.A.       DATE OF BIRTH:  [birth year]   DATE OF CONSULTATION:  05/21/2005  DATE OF DISCHARGE:                                   CONSULTATION   CHIEF COMPLAINT:  Epigastric, right upper quadrant pain.   HISTORY OF PRESENT ILLNESS:  Margaret Norman is a 60 year old female with known  history of cholelithiasis who is admitted earlier today with right upper  quadrant pain/epigastric pain/biliary colic which has worsened over the past  four days.  She does have known gallstones with the gallbladder ultrasound  __________  stones as well as common bile duct/intrahepatic biliary  dilatation.  Her LFTs are elevated.  The GI service, Dr. Russella Dar, was  consulted for possible ERCP.  We were consulted for cholecystectomy as well.   REVIEW OF SYSTEMS:  Otherwise negative.  She denies any chest pain,  shortness of breath.   PAST SURGICAL HISTORY:  1.  Cesarean section.  2.  Tonsillectomy.   PAST MEDICAL HISTORY:  1.  Gallstones.  2.  Elevated glucose.  3.  GERD.  4.  Pseudocysts (pancreas).  These are known lesions but the cause is      unknown.   ALLERGIES:  No known drug allergies.   MEDICATIONS:  Protonix.   SOCIAL HISTORY:  No tobacco or alcohol.  She is married.  She works in a  __________ .   FAMILY HISTORY:  Father died at age 10 of an MI.  Mother died at age 65.  No  GI problems.   LABORATORY DATA:  White count 13,600.  Total bilirubin 1.8, alk phos 234,  AST 233, ALT 387, lipase minimal elevated at 59.  X-ray and EKG pending.   PHYSICAL EXAMINATION:  VITAL SIGNS:  Blood pressure 110/64, pulse 92,  respirations 18, temperature 98.8.  HEENT:  Grossly normal.  No carotid or subclavian bruits.  No JVD or  thyromegaly.  Sclerae clear.  Normal nares without drainage.  CHEST:   Clear to auscultation bilaterally.  No wheezing or rhonchi.  HEART:  Regular rate and rhythm.  __________ .  ABDOMEN:  Obese.  Epigastric tenderness radiating to the right upper  quadrant.  NEUROLOGIC:  Grossly intact.  SKIN:  Warm and dry.   ASSESSMENT/PLAN:  1.  Symptomatic cholelithiasis and common bile duct dilatation as well as      elevated liver function tests.  2.  History of elevated glucose.  3.  Gastroesophageal reflux disease.  4.  Known pancreatic pseudocysts.   The GI consult is currently being performed and from what I understand, she  will undergo an ERCP tomorrow.  In the meantime, I will obtain preoperative  EKG and preoperative chest x-ray for tentative cholecystectomy on May 23, 2005.  We will, of course, follow her LFTs as well as CBC.  The patient  is seen and examined by Dr. Danna Hefty.  Guy Franco, P.A.     LB/MEDQ  D:  05/21/2005  T:  05/22/2005  Job:  [id number]   cc:   Rosalyn Gess. Norins, M.D. [facility]  [address]  [place]  [place] [address]   Venita Lick. Russella Dar, M.D. [facility]  [address]  [place]  [place] [address]

## 2010-11-10 NOTE — Letter (Signed)
September 20, 2007    [name]  [address]  [place], [place] [address]   RE:  [name], [name]  MRN:  [id number]  /  DOB:  16-May-1951   Dear Ms. Pondexter:   I am sorry about your feelings concerning the September 04, 2007 office  visit.  I appreciate your concerns.  I too share your frustrations in  reference to EMR (electronic medical records).  It is both demanding and  trying for the doctors and staff as well, and I am still learning how to  type and talk to the patient at the same time.  You are correct that  some of the questions are duplicated in reference to diabetes and  cholesterol risk management panels of questions.  These are not meant to  frustrate you, but to assure continuity of care and for risk assessment.  I understand your frustration in reference to the glucometer strips, but  I do understand that you are to receive free strips from the company  after our administrator contacted them on your behalf.  In reviewing my  notes from that day, I understood your symptoms of sleep dysfunction to  be related to events and pain suggestive of diabetic neuropathy  symptoms, rather than an abdominal pain complaint and was treating it as  such.  I do care about your health and you as a person.  I wish you the  best!    Sincerely,      [name], DO  Electronically Signed    [name]  DD: 09/20/2007  DT: 09/20/2007  Job #: [id number]

## 2010-11-30 ENCOUNTER — Other Ambulatory Visit: Payer: Self-pay | Admitting: Internal Medicine

## 2010-12-04 ENCOUNTER — Encounter: Payer: Self-pay | Admitting: Internal Medicine

## 2010-12-05 ENCOUNTER — Other Ambulatory Visit (INDEPENDENT_AMBULATORY_CARE_PROVIDER_SITE_OTHER): Payer: 59

## 2010-12-05 ENCOUNTER — Encounter: Payer: Self-pay | Admitting: Internal Medicine

## 2010-12-05 ENCOUNTER — Ambulatory Visit (INDEPENDENT_AMBULATORY_CARE_PROVIDER_SITE_OTHER): Payer: 59 | Admitting: Internal Medicine

## 2010-12-05 DIAGNOSIS — I10 Essential (primary) hypertension: Secondary | ICD-10-CM

## 2010-12-05 DIAGNOSIS — E119 Type 2 diabetes mellitus without complications: Secondary | ICD-10-CM

## 2010-12-05 DIAGNOSIS — E785 Hyperlipidemia, unspecified: Secondary | ICD-10-CM

## 2010-12-05 DIAGNOSIS — D72829 Elevated white blood cell count, unspecified: Secondary | ICD-10-CM

## 2010-12-05 LAB — CBC WITH DIFFERENTIAL/PLATELET
Basophils Absolute: 0.1 10*3/uL (ref 0.0–0.1)
Eosinophils Absolute: 0.3 10*3/uL (ref 0.0–0.7)
HCT: 36.3 % (ref 36.0–46.0)
Lymphs Abs: 4.8 10*3/uL — ABNORMAL HIGH (ref 0.7–4.0)
MCHC: 33 g/dL (ref 30.0–36.0)
MCV: 86.9 fl (ref 78.0–100.0)
Monocytes Absolute: 1.5 10*3/uL — ABNORMAL HIGH (ref 0.1–1.0)
Monocytes Relative: 10.1 % (ref 3.0–12.0)
Platelets: 453 10*3/uL — ABNORMAL HIGH (ref 150.0–400.0)
RDW: 14.5 % (ref 11.5–14.6)

## 2010-12-05 LAB — COMPREHENSIVE METABOLIC PANEL
ALT: 28 U/L (ref 0–35)
AST: 23 U/L (ref 0–37)
Albumin: 4.4 g/dL (ref 3.5–5.2)
CO2: 26 mEq/L (ref 19–32)
Calcium: 9.8 mg/dL (ref 8.4–10.5)
Chloride: 108 mEq/L (ref 96–112)
GFR: 87.66 mL/min (ref >60.00)
Potassium: 4.6 mEq/L (ref 3.5–5.1)
Sodium: 140 mEq/L (ref 135–145)
Total Protein: 7.4 g/dL (ref 6.0–8.3)

## 2010-12-05 LAB — HEMOGLOBIN A1C: Hgb A1c MFr Bld: 7.7 % — ABNORMAL HIGH (ref 4.6–6.5)

## 2010-12-05 MED ORDER — VALSARTAN 320 MG PO TABS: 320.0000 mg | ORAL_TABLET | Freq: Every day | ORAL | Status: DC

## 2010-12-05 NOTE — Assessment & Plan Note (Signed)
She feels like the HCTZ is making her dizzy so I will change to plain Diovan for renal protection

## 2010-12-05 NOTE — Patient Instructions (Signed)
Diabetes, Type 2 Diabetes is a lasting (chronic) disease. In type 2 diabetes, the pancreas does not make enough insulin (a hormone), and the body does not respond normally to the insulin that is made. This type of diabetes was also previously called adult onset diabetes. About 90% of all those who have diabetes have type 2. It usually occurs after the age of 40 but can occur at any age. CAUSES Unlike type 1 diabetes, which happens because insulin is no longer being made, type 2 diabetes happens because the body is making less insulin and has trouble using the insulin properly. SYMPTOMS  Drinking more than usual.   Urinating more than usual.   Blurred vision.   Dry, itchy skin.   Frequent infection like yeast infections in women.   More tired than usual (fatigue).  TREATMENT  Healthy eating.   Exercise.   Medication, if needed.   Monitoring blood glucose (sugar).   Seeing your caregiver regularly.  HOME CARE INSTRUCTIONS  Check your blood glucose (sugar) at least once daily. More frequent monitoring may be necessary, depending on your medications and on how well your diabetes is controlled. Your caregiver will advise you.   Take your medicine as directed by your caregiver.   Do not smoke.   Make wise food choices. Ask your caregiver for information. Weight loss can improve your diabetes.   Learn about low blood glucose (hypoglycemia) and how to treat it.   Get your eyes checked regularly.   Have a yearly physical exam. Have your blood pressure checked. Get your blood and urine tested.   Wear a pendant or bracelet saying that you have diabetes.   Check your feet every night for sores. Let your caregiver know if you have sores that are not healing.  SEEK MEDICAL CARE IF:  You are having problems keeping your blood glucose at target range.   You feel you might be having problems with your medicines.   You have symptoms of an illness that is not improving after 24  hours.   You have a sore or wound that is not healing.   You notice a change in vision or a new problem with your vision.   You develop a fever of more than 100.5.  Document Released: 06/11/2005 Document Re-Released: 07/03/2009 [employer] Patient Information 2011 [employer]. 

## 2010-12-05 NOTE — Assessment & Plan Note (Signed)
She has no s/s suspicious of infection or lymphoproliferative disease, today I will recheck her CBC

## 2010-12-05 NOTE — Assessment & Plan Note (Signed)
She is doing well on Crestor so will continue the same and will check her labs today

## 2010-12-05 NOTE — Progress Notes (Signed)
Subjective:    Patient ID: Margaret Norman, female    DOB: [birth year], 60 y.o.   MRN: [id number]  Diabetes She presents for her follow-up diabetic visit. She has type 2 diabetes mellitus. Her disease course has been improving. There are no hypoglycemic associated symptoms. Pertinent negatives for hypoglycemia include no headaches, pallor or sweats. Pertinent negatives for diabetes include no blurred vision, no chest pain, no fatigue, no foot paresthesias, no foot ulcerations, no polydipsia, no polyphagia, no polyuria, no visual change, no weakness and no weight loss. There are no hypoglycemic complications. Symptoms are stable. Current diabetic treatment includes oral agent (triple therapy). She is compliant with treatment all of the time. Her weight is stable. She is following a generally healthy diet. Meal planning includes avoidance of concentrated sweets. She has had a previous visit with a dietician. She participates in exercise intermittently. There is no change in her home blood glucose trend. Her breakfast blood glucose range is generally 110-130 mg/dl. Her lunch blood glucose range is generally 110-130 mg/dl. Her dinner blood glucose range is generally 130-140 mg/dl. Her highest blood glucose is 140-180 mg/dl. Her overall blood glucose range is 130-140 mg/dl. An ACE inhibitor/angiotensin II receptor blocker is being taken. She does not see a podiatrist.Eye exam is current.  Hypertension This is a chronic problem. The current episode started more than 1 year ago. The problem has been gradually improving since onset. The problem is controlled. Pertinent negatives include no anxiety, blurred vision, chest pain, headaches, neck pain, orthopnea, palpitations, peripheral edema, PND, shortness of breath or sweats. Past treatments include diuretics and angiotensin blockers. The current treatment provides significant improvement. Compliance problems include medication side effects.       Review of  Systems  Constitutional: Negative.  Negative for fever, chills, weight loss, diaphoresis, activity change, appetite change, fatigue and unexpected weight change.  HENT: Negative.  Negative for neck pain.   Eyes: Negative.  Negative for blurred vision.  Respiratory: Negative.  Negative for shortness of breath.   Cardiovascular: Negative for chest pain, palpitations, orthopnea, leg swelling and PND.  Gastrointestinal: Negative for nausea, vomiting, abdominal pain, diarrhea, constipation and abdominal distention.  Genitourinary: Negative.  Negative for polyuria.  Musculoskeletal: Negative.  Negative for myalgias, back pain, joint swelling, arthralgias and gait problem.  Skin: Negative for color change, pallor and rash.  Neurological: Negative.  Negative for weakness and headaches.  Hematological: Negative for polydipsia, polyphagia and adenopathy.  Psychiatric/Behavioral: Negative.        Objective:   Physical Exam  Vitals reviewed. Constitutional: She is oriented to person, place, and time. She appears well-developed and well-nourished. No distress.  HENT:  Head: Normocephalic and atraumatic.  Right Ear: External ear normal.  Left Ear: External ear normal.  Nose: Nose normal.  Mouth/Throat: Oropharynx is clear and moist. No oropharyngeal exudate.  Eyes: Conjunctivae and EOM are normal. Pupils are equal, round, and reactive to light. Right eye exhibits no discharge. Left eye exhibits no discharge. No scleral icterus.  Neck: Normal range of motion. Neck supple. No JVD present. No tracheal deviation present. No thyromegaly present.  Cardiovascular: Normal rate, regular rhythm, normal heart sounds and intact distal pulses.  Exam reveals no gallop and no friction rub.   No murmur heard. Pulmonary/Chest: Effort normal and breath sounds normal. No stridor. No respiratory distress. She has no wheezes. She has no rales. She exhibits no tenderness.  Abdominal: Soft. Bowel sounds are normal. She  exhibits no distension and no mass. There is no  tenderness. There is no rebound and no guarding.  Musculoskeletal: Normal range of motion. She exhibits edema (trace edema in both legs). She exhibits no tenderness.  Lymphadenopathy:       Head (right side): No submental, no submandibular, no tonsillar, no preauricular, no posterior auricular and no occipital adenopathy present.       Head (left side): No submental, no submandibular, no tonsillar, no preauricular, no posterior auricular and no occipital adenopathy present.    She has no cervical adenopathy.       Right cervical: No superficial cervical, no deep cervical and no posterior cervical adenopathy present.      Left cervical: No superficial cervical, no deep cervical and no posterior cervical adenopathy present.    She has no axillary adenopathy.       Right: No inguinal, no supraclavicular and no epitrochlear adenopathy present.       Left: No inguinal, no supraclavicular and no epitrochlear adenopathy present.  Neurological: She is alert and oriented to person, place, and time. She has normal reflexes. She displays normal reflexes. No cranial nerve deficit. She exhibits normal muscle tone. Coordination normal.  Skin: Skin is warm and dry. No rash noted. She is not diaphoretic. No erythema. No pallor.  Psychiatric: She has a normal mood and affect. Her behavior is normal. Judgment and thought content normal.        Lab Results  Component Value Date   WBC 17.1* 09/05/2010   HGB 12.0 10/04/2010   HCT 37.2 10/04/2010   PLT 593* 10/04/2010   CHOL 105 10/04/2009   TRIG 131.0 10/04/2009   HDL 50.80 10/04/2009   ALT 20 10/04/2010   AST 17 10/04/2010   NA 143 10/04/2010   K 3.8 10/04/2010   CL 106 10/04/2010   CREATININE 0.69 10/04/2010   BUN 14 10/04/2010   CO2 25 10/04/2010   TSH 1.21 05/26/2010   HGBA1C 7.2* 09/05/2010   MICROALBUR 0.5 11/15/2008    Assessment & Plan:

## 2010-12-06 ENCOUNTER — Encounter: Payer: Self-pay | Admitting: Internal Medicine

## 2010-12-06 NOTE — Assessment & Plan Note (Signed)
It sounds like her blood sugars are a little high so I will check her A1C level and monitor her renal function

## 2010-12-06 NOTE — Assessment & Plan Note (Signed)
She has no s/s of infection or lymphoproliferative disease, today I will recheck her CBC and will address any new findings

## 2011-01-02 ENCOUNTER — Other Ambulatory Visit: Payer: Self-pay | Admitting: Internal Medicine

## 2011-03-30 LAB — URINALYSIS, ROUTINE W REFLEX MICROSCOPIC
Nitrite: NEGATIVE
Protein, ur: 100 mg/dL — AB
Specific Gravity, Urine: 1.033 — ABNORMAL HIGH (ref 1.005–1.030)
Urobilinogen, UA: 1 mg/dL (ref 0.0–1.0)

## 2011-03-30 LAB — URINE CULTURE

## 2011-03-30 LAB — POCT I-STAT, CHEM 8
Calcium, Ion: 1.11 mmol/L — ABNORMAL LOW (ref 1.12–1.32)
Creatinine, Ser: 0.8 mg/dL (ref 0.4–1.2)
Glucose, Bld: 167 mg/dL — ABNORMAL HIGH (ref 70–99)
HCT: 42 % (ref 36.0–46.0)
Hemoglobin: 14.3 g/dL (ref 12.0–15.0)
TCO2: 22 mmol/L (ref 0–100)

## 2011-03-30 LAB — URINE MICROSCOPIC-ADD ON

## 2011-04-09 ENCOUNTER — Encounter: Payer: Self-pay | Admitting: Internal Medicine

## 2011-04-09 ENCOUNTER — Ambulatory Visit (INDEPENDENT_AMBULATORY_CARE_PROVIDER_SITE_OTHER): Payer: 59 | Admitting: Internal Medicine

## 2011-04-09 ENCOUNTER — Other Ambulatory Visit (INDEPENDENT_AMBULATORY_CARE_PROVIDER_SITE_OTHER): Payer: 59

## 2011-04-09 VITALS — BP 130/80 | HR 77 | Temp 97.3°F | Resp 16 | Wt 186.0 lb

## 2011-04-09 DIAGNOSIS — D72829 Elevated white blood cell count, unspecified: Secondary | ICD-10-CM

## 2011-04-09 DIAGNOSIS — I10 Essential (primary) hypertension: Secondary | ICD-10-CM

## 2011-04-09 DIAGNOSIS — E785 Hyperlipidemia, unspecified: Secondary | ICD-10-CM

## 2011-04-09 DIAGNOSIS — E119 Type 2 diabetes mellitus without complications: Secondary | ICD-10-CM

## 2011-04-09 DIAGNOSIS — Z23 Encounter for immunization: Secondary | ICD-10-CM

## 2011-04-09 LAB — CBC WITH DIFFERENTIAL/PLATELET
Basophils Relative: 0.5 % (ref 0.0–3.0)
Eosinophils Relative: 2.3 % (ref 0.0–5.0)
HCT: 36.7 % (ref 36.0–46.0)
Lymphs Abs: 5.5 10*3/uL — ABNORMAL HIGH (ref 0.7–4.0)
MCV: 87.5 fl (ref 78.0–100.0)
Monocytes Relative: 11.1 % (ref 3.0–12.0)
Platelets: 440 10*3/uL — ABNORMAL HIGH (ref 150.0–400.0)
RBC: 4.19 Mil/uL (ref 3.87–5.11)
WBC: 13.9 10*3/uL — ABNORMAL HIGH (ref 4.5–10.5)

## 2011-04-09 LAB — COMPREHENSIVE METABOLIC PANEL
ALT: 25 U/L (ref 0–35)
CO2: 26 mEq/L (ref 19–32)
Calcium: 9.1 mg/dL (ref 8.4–10.5)
Chloride: 110 mEq/L (ref 96–112)
Creatinine, Ser: 0.6 mg/dL (ref 0.4–1.2)
GFR: 114.66 mL/min (ref >60.00)
Glucose, Bld: 184 mg/dL — ABNORMAL HIGH (ref 70–99)
Sodium: 144 mEq/L (ref 135–145)
Total Protein: 7.1 g/dL (ref 6.0–8.3)

## 2011-04-09 LAB — LIPID PANEL
HDL: 47.7 mg/dL (ref >39.00)
Total CHOL/HDL Ratio: 3

## 2011-04-09 LAB — HEMOGLOBIN A1C: Hgb A1c MFr Bld: 8.5 % — ABNORMAL HIGH (ref 4.6–6.5)

## 2011-04-09 MED ORDER — COLESEVELAM HCL 3.75 G PO PACK: 1.0000 | PACK | Freq: Every day | ORAL | Status: DC

## 2011-04-09 NOTE — Progress Notes (Signed)
Subjective:    Patient ID: Margaret Norman, female    DOB: 06-Apr-1951, 60 y.o.   MRN: [id number]  Diabetes She presents for her follow-up diabetic visit. She has type 2 diabetes mellitus. Her disease course has been stable. There are no hypoglycemic associated symptoms. Pertinent negatives for hypoglycemia include no dizziness, headaches, pallor, seizures, speech difficulty or tremors. Pertinent negatives for diabetes include no blurred vision, no chest pain, no fatigue, no foot paresthesias, no foot ulcerations, no polydipsia, no polyphagia, no polyuria, no visual change, no weakness and no weight loss. There are no hypoglycemic complications. Symptoms are stable. There are no diabetic complications. Current diabetic treatment includes oral agent (triple therapy). She is compliant with treatment all of the time. Her weight is stable. She is following a generally healthy diet. Meal planning includes avoidance of concentrated sweets. There is no change in her home blood glucose trend. Her breakfast blood glucose range is generally 110-130 mg/dl. Her lunch blood glucose range is generally 140-180 mg/dl. Her dinner blood glucose range is generally 140-180 mg/dl. Her highest blood glucose is 140-180 mg/dl. Her overall blood glucose range is 130-140 mg/dl. An ACE inhibitor/angiotensin II receptor blocker is being taken. She does not see a podiatrist.Eye exam is current.      Review of Systems  Constitutional: Negative for fever, chills, weight loss, diaphoresis, activity change, appetite change, fatigue and unexpected weight change.  HENT: Negative.   Eyes: Negative.  Negative for blurred vision.  Respiratory: Negative for apnea, cough, choking, chest tightness, shortness of breath, wheezing and stridor.   Cardiovascular: Negative for chest pain, palpitations and leg swelling.  Gastrointestinal: Negative for nausea, vomiting, abdominal pain, diarrhea, constipation, blood in stool, abdominal distention,  anal bleeding and rectal pain.  Genitourinary: Negative for dysuria, urgency, polyuria, frequency, hematuria, flank pain, enuresis, difficulty urinating, pelvic pain and dyspareunia.  Musculoskeletal: Negative for myalgias, back pain, joint swelling, arthralgias and gait problem.  Skin: Negative for color change, pallor, rash and wound.  Neurological: Negative for dizziness, tremors, seizures, syncope, facial asymmetry, speech difficulty, weakness, light-headedness, numbness and headaches.  Hematological: Negative for polydipsia, polyphagia and adenopathy. Does not bruise/bleed easily.  Psychiatric/Behavioral: Negative.        Objective:   Physical Exam  Vitals reviewed. Constitutional: She is oriented to person, place, and time. She appears well-developed and well-nourished. No distress.  HENT:  Head: Normocephalic and atraumatic.  Mouth/Throat: Oropharynx is clear and moist. No oropharyngeal exudate.  Eyes: Conjunctivae and EOM are normal. Pupils are equal, round, and reactive to light. Right eye exhibits no discharge. Left eye exhibits no discharge. No scleral icterus.  Neck: Normal range of motion. Neck supple. No JVD present. No tracheal deviation present. No thyromegaly present.  Cardiovascular: Normal rate, regular rhythm, normal heart sounds and intact distal pulses.  Exam reveals no gallop and no friction rub.   No murmur heard. Pulmonary/Chest: Effort normal and breath sounds normal. No stridor. No respiratory distress. She has no wheezes. She has no rales. She exhibits no tenderness.  Abdominal: Soft. Bowel sounds are normal. She exhibits no distension. There is no tenderness. There is no rebound and no guarding.  Musculoskeletal: Normal range of motion. She exhibits no edema and no tenderness.  Lymphadenopathy:    She has no cervical adenopathy.  Neurological: She is oriented to person, place, and time. She displays normal reflexes. She exhibits normal muscle tone. Coordination  normal.  Skin: Skin is warm and dry. No rash noted. She is not diaphoretic. No erythema.  No pallor.  Psychiatric: She has a normal mood and affect. Her behavior is normal. Judgment and thought content normal.      Lab Results  Component Value Date   WBC 14.6* 12/05/2010   HGB 12.0 12/05/2010   HCT 36.3 12/05/2010   PLT 453.0* 12/05/2010   GLUCOSE 144* 12/05/2010   CHOL 105 10/04/2009   TRIG 131.0 10/04/2009   HDL 50.80 10/04/2009   LDLCALC 28 10/04/2009   ALT 28 12/05/2010   AST 23 12/05/2010   NA 140 12/05/2010   K 4.6 12/05/2010   CL 108 12/05/2010   CREATININE 0.7 12/05/2010   BUN 16 12/05/2010   CO2 26 12/05/2010   TSH 1.21 05/26/2010   HGBA1C 7.7* 12/05/2010   MICROALBUR 0.5 11/15/2008      Assessment & Plan:

## 2011-04-09 NOTE — Assessment & Plan Note (Signed)
I will check her A1C today 

## 2011-04-09 NOTE — Assessment & Plan Note (Signed)
Her bp is well controlled, I will check her lytes and renal function

## 2011-04-09 NOTE — Assessment & Plan Note (Signed)
I will check her cbc today

## 2011-04-09 NOTE — Patient Instructions (Signed)
Diabetes, Type 2 Diabetes is a lasting (chronic) disease. In type 2 diabetes, the pancreas does not make enough insulin (a hormone), and the body does not respond normally to the insulin that is made. This type of diabetes was also previously called adult onset diabetes. About 90% of all those who have diabetes have type 2. It usually occurs after the age of 40 but can occur at any age. CAUSES Unlike type 1 diabetes, which happens because insulin is no longer being made, type 2 diabetes happens because the body is making less insulin and has trouble using the insulin properly. SYMPTOMS  Drinking more than usual.   Urinating more than usual.   Blurred vision.   Dry, itchy skin.   Frequent infection like yeast infections in women.   More tired than usual (fatigue).  TREATMENT  Healthy eating.   Exercise.   Medication, if needed.   Monitoring blood glucose (sugar).   Seeing your caregiver regularly.  HOME CARE INSTRUCTIONS  Check your blood glucose (sugar) at least once daily. More frequent monitoring may be necessary, depending on your medications and on how well your diabetes is controlled. Your caregiver will advise you.   Take your medicine as directed by your caregiver.   Do not smoke.   Make wise food choices. Ask your caregiver for information. Weight loss can improve your diabetes.   Learn about low blood glucose (hypoglycemia) and how to treat it.   Get your eyes checked regularly.   Have a yearly physical exam. Have your blood pressure checked. Get your blood and urine tested.   Wear a pendant or bracelet saying that you have diabetes.   Check your feet every night for sores. Let your caregiver know if you have sores that are not healing.  SEEK MEDICAL CARE IF:  You are having problems keeping your blood glucose at target range.   You feel you might be having problems with your medicines.   You have symptoms of an illness that is not improving after 24  hours.   You have a sore or wound that is not healing.   You notice a change in vision or a new problem with your vision.   You develop a fever of more than 100.5.  Document Released: 06/11/2005 Document Re-Released: 07/03/2009 [employer] Patient Information 2011 [employer]. 

## 2011-04-09 NOTE — Assessment & Plan Note (Signed)
She is doing well on crestor, I will add welchol for better control of lipids as well as the blood sugar

## 2011-04-15 ENCOUNTER — Other Ambulatory Visit: Payer: Self-pay | Admitting: Internal Medicine

## 2011-05-08 ENCOUNTER — Other Ambulatory Visit: Payer: Self-pay | Admitting: Internal Medicine

## 2011-05-15 ENCOUNTER — Encounter: Payer: Self-pay | Admitting: Internal Medicine

## 2011-05-15 ENCOUNTER — Ambulatory Visit (INDEPENDENT_AMBULATORY_CARE_PROVIDER_SITE_OTHER): Payer: 59 | Admitting: Internal Medicine

## 2011-05-15 VITALS — BP 122/82 | HR 87 | Temp 98.1°F | Ht 63.0 in | Wt 187.1 lb

## 2011-05-15 DIAGNOSIS — I10 Essential (primary) hypertension: Secondary | ICD-10-CM

## 2011-05-15 DIAGNOSIS — E119 Type 2 diabetes mellitus without complications: Secondary | ICD-10-CM

## 2011-05-15 DIAGNOSIS — J209 Acute bronchitis, unspecified: Secondary | ICD-10-CM

## 2011-05-15 MED ORDER — AZITHROMYCIN 250 MG PO TABS: ORAL_TABLET | ORAL | Status: AC

## 2011-05-15 MED ORDER — HYDROCODONE-HOMATROPINE 5-1.5 MG/5ML PO SYRP: 5.0000 mL | ORAL_SOLUTION | Freq: Four times a day (QID) | ORAL | Status: AC | PRN

## 2011-05-15 NOTE — Patient Instructions (Signed)
Take all new medications as prescribed Continue all other medications as before  

## 2011-05-16 ENCOUNTER — Encounter: Payer: Self-pay | Admitting: Internal Medicine

## 2011-05-16 NOTE — Assessment & Plan Note (Signed)
Mild to mod, for antibx course,  to f/u any worsening symptoms or concerns 

## 2011-05-16 NOTE — Assessment & Plan Note (Signed)
Mild uncontrolled overall by hx and exam, most recent data reviewed with pt, and pt to continue medical treatment as before with improved diet, f/u with PCP  Lab Results  Component Value Date   HGBA1C 8.5* 04/09/2011

## 2011-05-16 NOTE — Progress Notes (Signed)
Subjective:    Patient ID: Margaret Norman, female    DOB: 1950-09-25, 60 y.o.   MRN: [id number]  HPI  Here with acute onset mild to mod 2-3 days ST, HA, general weakness and malaise, with prod cough greenish sputum, but Pt denies chest pain, increased sob or doe, wheezing, orthopnea, PND, increased LE swelling, palpitations, dizziness or syncope.  Pt denies new neurological symptoms such as new headache, or facial or extremity weakness or numbness   Pt denies polydipsia, polyuria, or low sugar symptoms such as weakness or confusion improved with po intake.  Pt states overall good compliance with meds, trying to follow lower cholesterol, diabetic diet, wt overall stable but little exercise however.  Overall good compliance with treatment, and good medicine tolerability.   Past Medical History  Diagnosis Date  . Type II or unspecified type diabetes mellitus without mention of complication, not stated as uncontrolled   . Depression   . GERD (gastroesophageal reflux disease)   . Hyperlipidemia   . Nephrolithiasis   . HTN (hypertension)   . Gallstones 11-06   Past Surgical History  Procedure Date  . Tonsillectomy and adenoidectomy   . Cesarean section     x2 ? w/appy  . Knee surgery 11/2005  . Splenectomy   . Esophagogastroduodenoscopy     reports that she has never smoked. She does not have any smokeless tobacco history on file. She reports that she does not drink alcohol or use illicit drugs. family history includes Cancer in her other; Coronary artery disease in her other; Diabetes in her other; Heart attack in her other; Hyperlipidemia in her other; Hypertension in her other; Skin cancer in her other; and Stroke in her maternal grandfather. No Known Allergies Current Outpatient Prescriptions on File Prior to Visit  Medication Sig Dispense Refill  . aspirin 81 MG EC tablet Take 81 mg by mouth daily.        Marland Kitchen buPROPion (WELLBUTRIN XL) 150 MG 24 hr tablet Take 150 mg by mouth every  morning.        Marland Kitchen glimepiride (AMARYL) 4 MG tablet TAKE 1 TABLET ONCE DAILY  90 tablet  1  . glucose blood (FREESTYLE LITE) test strip 1 each by Other route as needed. Use as instructed       . Insulin Pen Needle (QC PEN NEEDLES) 31G X 8 MM MISC daily.        . Lancets (FREESTYLE) lancets 1 each by Other route 3 (three) times daily. Use as instructed       . Liraglutide (VICTOZA) 18 MG/3ML SOLN Inject 1.8 mg into the skin daily.        . metFORMIN (GLUCOPHAGE) 1000 MG tablet TAKE 1 TABLET TWICE A DAY  180 tablet  1  . niacin (NIASPAN) 500 MG CR tablet Take 1,000 mg by mouth at bedtime.        Marland Kitchen omeprazole (PRILOSEC) 20 MG capsule TAKE 2 CAPSULES DAILY  180 capsule  1  . pioglitazone (ACTOS) 30 MG tablet Take 30 mg by mouth daily.        . rosuvastatin (CRESTOR) 20 MG tablet Take 20 mg by mouth at bedtime.        . sertraline (ZOLOFT) 50 MG tablet TAKE 1 TABLET ONCE DAILY  90 tablet  1  . valsartan (DIOVAN) 320 MG tablet Take 1 tablet (320 mg total) by mouth daily.  90 tablet  3  . Colesevelam HCl 3.75 G PACK Take 1 each by mouth  daily.  15 each  0    Review of Systems Review of Systems  Constitutional: Negative for diaphoresis and unexpected weight change.  HENT: Negative for drooling and tinnitus.   Eyes: Negative for photophobia and visual disturbance.  Respiratory: Negative for choking and stridor.   Gastrointestinal: Negative for vomiting and blood in stool.  Genitourinary: Negative for hematuria and decreased urine volume.  Objective:   Physical Exam BP 122/82  Pulse 87  Temp(Src) 98.1 F (36.7 C) (Oral)  Ht 5\' 3"  (1.6 m)  Wt 187 lb 2 oz (84.879 kg)  BMI 33.15 kg/m2  SpO2 98% Physical Exam  VS noted. Mild ill Constitutional: Pt appears well-developed and well-nourished.  HENT: Head: Normocephalic.  Right Ear: External ear normal.  Left Ear: External ear normal.  Bilat tm's mild erythema.  Sinus nontender.  Pharynx mild erythema Eyes: Conjunctivae and EOM are normal.  Pupils are equal, round, and reactive to light.  Neck: Normal range of motion. Neck supple.  Cardiovascular: Normal rate and regular rhythm.   Pulmonary/Chest: Effort normal and breath sounds normal.  Neurological: Pt is alert. No cranial nerve deficit.  Skin: Skin is warm. No erythema.  Psychiatric: Pt behavior is normal. Thought content normal.     Assessment & Plan:

## 2011-05-16 NOTE — Assessment & Plan Note (Signed)
stable overall by hx and exam, most recent data reviewed with pt, and pt to continue medical treatment as before  BP Readings from Last 3 Encounters:  05/15/11 122/82  04/09/11 130/80  12/05/10 140/90

## 2011-06-05 ENCOUNTER — Other Ambulatory Visit: Payer: Self-pay | Admitting: Internal Medicine

## 2011-06-05 NOTE — Telephone Encounter (Signed)
The pt called and is requesting a refill of Bupropion 150mg  sent to [facility]   Thanks!

## 2011-06-06 MED ORDER — BUPROPION HCL ER (XL) 150 MG PO TB24: ORAL_TABLET | ORAL | Status: DC

## 2011-06-06 NOTE — Telephone Encounter (Signed)
rx sent to [facility] per pt request

## 2011-08-07 ENCOUNTER — Other Ambulatory Visit (INDEPENDENT_AMBULATORY_CARE_PROVIDER_SITE_OTHER): Payer: 59

## 2011-08-07 ENCOUNTER — Encounter: Payer: Self-pay | Admitting: Internal Medicine

## 2011-08-07 ENCOUNTER — Ambulatory Visit (INDEPENDENT_AMBULATORY_CARE_PROVIDER_SITE_OTHER): Payer: 59 | Admitting: Internal Medicine

## 2011-08-07 ENCOUNTER — Other Ambulatory Visit: Payer: 59

## 2011-08-07 DIAGNOSIS — E119 Type 2 diabetes mellitus without complications: Secondary | ICD-10-CM

## 2011-08-07 DIAGNOSIS — J019 Acute sinusitis, unspecified: Secondary | ICD-10-CM

## 2011-08-07 DIAGNOSIS — E785 Hyperlipidemia, unspecified: Secondary | ICD-10-CM

## 2011-08-07 DIAGNOSIS — I10 Essential (primary) hypertension: Secondary | ICD-10-CM

## 2011-08-07 LAB — HEMOGLOBIN A1C: Hgb A1c MFr Bld: 10.7 % — ABNORMAL HIGH (ref 4.6–6.5)

## 2011-08-07 LAB — LIPID PANEL
HDL: 42.9 mg/dL (ref >39.00)
LDL Cholesterol: 70 mg/dL (ref 0–99)
Total CHOL/HDL Ratio: 3
Triglycerides: 142 mg/dL (ref 0.0–149.0)
VLDL: 28.4 mg/dL (ref 0.0–40.0)

## 2011-08-07 LAB — COMPREHENSIVE METABOLIC PANEL
AST: 21 U/L (ref 0–37)
Alkaline Phosphatase: 94 U/L (ref 39–117)
BUN: 16 mg/dL (ref 6–23)
Creatinine, Ser: 0.7 mg/dL (ref 0.4–1.2)

## 2011-08-07 MED ORDER — AMOXICILLIN-POT CLAVULANATE 500-125 MG PO TABS: 1.0000 | ORAL_TABLET | Freq: Three times a day (TID) | ORAL | Status: AC

## 2011-08-07 MED ORDER — FREESTYLE LANCETS MISC: Status: DC

## 2011-08-07 MED ORDER — GLUCOSE BLOOD VI STRP: ORAL_STRIP | Status: DC

## 2011-08-07 MED ORDER — COLESEVELAM HCL 3.75 G PO PACK: 1.0000 | PACK | Freq: Every day | ORAL | Status: DC

## 2011-08-07 NOTE — Assessment & Plan Note (Signed)
Cont crestor, welchol, etc

## 2011-08-07 NOTE — Assessment & Plan Note (Signed)
Her BP is well controlled, I will check her lytes and renal function today 

## 2011-08-07 NOTE — Assessment & Plan Note (Signed)
Check a1c and monitor her renal function 

## 2011-08-07 NOTE — Assessment & Plan Note (Signed)
Start augmentin 

## 2011-08-07 NOTE — Patient Instructions (Signed)
Diabetes, Type 2 Diabetes is a long-lasting (chronic) disease. In type 2 diabetes, the pancreas does not make enough insulin (a hormone), and the body does not respond normally to the insulin that is made. This type of diabetes was also previously called adult-onset diabetes. It usually occurs after the age of 40, but it can occur at any age.  CAUSES  Type 2 diabetes happens because the pancreasis not making enough insulin or your body has trouble using the insulin that your pancreas does make properly. SYMPTOMS   Drinking more than usual.   Urinating more than usual.   Blurred vision.   Dry, itchy skin.   Frequent infections.   Feeling more tired than usual (fatigue).  DIAGNOSIS The diagnosis of type 2 diabetes is usually made by one of the following tests:  Fasting blood glucose test. You will not eat for at least 8 hours and then take a blood test.   Random blood glucose test. Your blood glucose (sugar) is checked at any time of the day regardless of when you ate.   Oral glucose tolerance test (OGTT). Your blood glucose is measured after you have not eaten (fasted) and then after you drink a glucose containing beverage.  TREATMENT   Healthy eating.   Exercise.   Medicine, if needed.   Monitoring blood glucose.   Seeing your caregiver regularly.  HOME CARE INSTRUCTIONS   Check your blood glucose at least once a day. More frequent monitoring may be necessary, depending on your medicines and on how well your diabetes is controlled. Your caregiver will advise you.   Take your medicine as directed by your caregiver.   Do not smoke.   Make wise food choices. Ask your caregiver for information. Weight loss can improve your diabetes.   Learn about low blood glucose (hypoglycemia) and how to treat it.   Get your eyes checked regularly.   Have a yearly physical exam. Have your blood pressure checked and your blood and urine tested.   Wear a pendant or bracelet saying  that you have diabetes.   Check your feet every night for cuts, sores, blisters, and redness. Let your caregiver know if you have any problems.  SEEK MEDICAL CARE IF:   You have problems keeping your blood glucose in target range.   You have problems with your medicines.   You have symptoms of an illness that do not improve after 24 hours.   You have a sore or wound that is not healing.   You notice a change in vision or a new problem with your vision.   You have a fever.  MAKE SURE YOU:  Understand these instructions.   Will watch your condition.   Will get help right away if you are not doing well or get worse.  Document Released: 06/11/2005 Document Revised: 02/22/2011 Document Reviewed: 11/27/2010 [employer] Patient Information 2012 [employer].Sinusitis Sinuses are air pockets within the bones of your face. The growth of bacteria within a sinus leads to infection. The infection prevents the sinuses from draining. This infection is called sinusitis. SYMPTOMS  There will be different areas of pain depending on which sinuses have become infected.  The maxillary sinuses often produce pain beneath the eyes.   Frontal sinusitis may cause pain in the middle of the forehead and above the eyes.  Other problems (symptoms) include:  Toothaches.   Colored, pus-like (purulent) drainage from the nose.   Swelling, warmth, and tenderness over the sinus areas may be signs   of infection.  TREATMENT  Sinusitis is most often determined by an exam.X-rays may be taken. If x-rays have been taken, make sure you obtain your results or find out how you are to obtain them. Your caregiver may give you medications (antibiotics). These are medications that will help kill the bacteria causing the infection. You may also be given a medication (decongestant) that helps to reduce sinus swelling.  HOME CARE INSTRUCTIONS   Only take over-the-counter or prescription medicines for pain, discomfort, or  fever as directed by your caregiver.   Drink extra fluids. Fluids help thin the mucus so your sinuses can drain more easily.   Applying either moist heat or ice packs to the sinus areas may help relieve discomfort.   Use saline nasal sprays to help moisten your sinuses. The sprays can be found at your local drugstore.  SEEK IMMEDIATE MEDICAL CARE IF:  You have a fever.   You have increasing pain, severe headaches, or toothache.   You have nausea, vomiting, or drowsiness.   You develop unusual swelling around the face or trouble seeing.  MAKE SURE YOU:   Understand these instructions.   Will watch your condition.   Will get help right away if you are not doing well or get worse.  Document Released: 06/11/2005 Document Revised: 02/21/2011 Document Reviewed: 01/08/2007 [employer] Patient Information 2012 [employer]. 

## 2011-08-07 NOTE — Progress Notes (Signed)
Subjective:    Patient ID: Margaret Norman, female    DOB: 11-03-1950, 61 y.o.   MRN: [id number]  Sinusitis This is a new problem. The current episode started 1 to 4 weeks ago. The problem has been gradually worsening since onset. There has been no fever. The fever has been present for less than 1 day. Her pain is at a severity of 0/10. She is experiencing no pain. Associated symptoms include chills, congestion, sinus pressure, sneezing and a sore throat. Pertinent negatives include no coughing, diaphoresis, ear pain, headaches, hoarse voice, neck pain, shortness of breath or swollen glands. Past treatments include nothing.  Diabetes She presents for her follow-up diabetic visit. She has type 2 diabetes mellitus. Her disease course has been stable. There are no hypoglycemic associated symptoms. Pertinent negatives for hypoglycemia include no dizziness, headaches, pallor, seizures, speech difficulty or tremors. Pertinent negatives for diabetes include no blurred vision, no chest pain, no fatigue, no foot paresthesias, no foot ulcerations, no polydipsia, no polyphagia, no polyuria, no visual change, no weakness and no weight loss. There are no hypoglycemic complications. Symptoms are stable. There are no diabetic complications. Current diabetic treatment includes oral agent (triple therapy). She is compliant with treatment all of the time. Her weight is increasing steadily. She is following a generally unhealthy diet. When asked about meal planning, she reported none. She has not had a previous visit with a dietician. She never participates in exercise. There is no change in her home blood glucose trend. An ACE inhibitor/angiotensin II receptor blocker is being taken. She does not see a podiatrist.Eye exam is current.      Review of Systems  Constitutional: Positive for chills. Negative for fever, weight loss, diaphoresis, activity change, appetite change, fatigue and unexpected weight change.  HENT:  Positive for congestion, sore throat, rhinorrhea, sneezing, postnasal drip and sinus pressure. Negative for ear pain, nosebleeds, hoarse voice, facial swelling, trouble swallowing, neck pain, neck stiffness, voice change and tinnitus.   Eyes: Negative.  Negative for blurred vision.  Respiratory: Negative for apnea, cough, choking, chest tightness, shortness of breath and wheezing.   Cardiovascular: Negative for chest pain, palpitations and leg swelling.  Gastrointestinal: Negative.   Genitourinary: Negative.  Negative for polyuria.  Musculoskeletal: Negative for myalgias, back pain, joint swelling, arthralgias and gait problem.  Skin: Negative for color change, pallor, rash and wound.  Neurological: Negative for dizziness, tremors, seizures, syncope, facial asymmetry, speech difficulty, weakness, light-headedness, numbness and headaches.  Hematological: Negative for polydipsia, polyphagia and adenopathy. Does not bruise/bleed easily.  Psychiatric/Behavioral: Negative.        Objective:   Physical Exam  Vitals reviewed. Constitutional: She is oriented to person, place, and time. She appears well-developed and well-nourished. No distress.  HENT:  Head: Normocephalic and atraumatic. No trismus in the jaw.  Right Ear: Hearing, tympanic membrane, external ear and ear canal normal.  Left Ear: Hearing, tympanic membrane, external ear and ear canal normal.  Nose: Mucosal edema and rhinorrhea present. No nose lacerations, sinus tenderness, nasal deformity, septal deviation or nasal septal hematoma. No epistaxis.  No foreign bodies. Right sinus exhibits maxillary sinus tenderness. Right sinus exhibits no frontal sinus tenderness. Left sinus exhibits maxillary sinus tenderness. Left sinus exhibits no frontal sinus tenderness.  Mouth/Throat: Uvula is midline, oropharynx is clear and moist and mucous membranes are normal. Mucous membranes are not pale, not dry and not cyanotic. No uvula swelling. No  oropharyngeal exudate, posterior oropharyngeal edema, posterior oropharyngeal erythema or tonsillar abscesses.  Eyes:  Conjunctivae are normal. Right eye exhibits no discharge. Left eye exhibits no discharge. No scleral icterus.  Neck: Normal range of motion. Neck supple. No JVD present. No tracheal deviation present. No thyromegaly present.  Cardiovascular: Normal rate, regular rhythm, normal heart sounds and intact distal pulses.  Exam reveals no gallop and no friction rub.   No murmur heard. Pulmonary/Chest: Effort normal and breath sounds normal. No stridor. No respiratory distress. She has no wheezes. She has no rales. She exhibits no tenderness.  Abdominal: Soft. Bowel sounds are normal. She exhibits no distension and no mass. There is no tenderness. There is no rebound and no guarding.  Musculoskeletal: Normal range of motion. She exhibits edema (tace edema in both legs). She exhibits no tenderness.  Lymphadenopathy:    She has no cervical adenopathy.  Neurological: She is oriented to person, place, and time.  Skin: Skin is warm and dry. No rash noted. She is not diaphoretic. No erythema. No pallor.  Psychiatric: She has a normal mood and affect. Her behavior is normal. Judgment and thought content normal.     Lab Results  Component Value Date   WBC 13.9* 04/09/2011   HGB 12.1 04/09/2011   HCT 36.7 04/09/2011   PLT 440.0* 04/09/2011   GLUCOSE 184* 04/09/2011   CHOL 152 04/09/2011   TRIG 122.0 04/09/2011   HDL 47.70 04/09/2011   LDLCALC 80 04/09/2011   ALT 25 04/09/2011   AST 18 04/09/2011   NA 144 04/09/2011   K 3.7 04/09/2011   CL 110 04/09/2011   CREATININE 0.6 04/09/2011   BUN 18 04/09/2011   CO2 26 04/09/2011   TSH 1.21 05/26/2010   HGBA1C 8.5* 04/09/2011   MICROALBUR 0.5 11/15/2008       Assessment & Plan:

## 2011-08-19 ENCOUNTER — Other Ambulatory Visit: Payer: Self-pay | Admitting: Internal Medicine

## 2011-09-11 ENCOUNTER — Other Ambulatory Visit: Payer: Self-pay | Admitting: Internal Medicine

## 2011-10-30 ENCOUNTER — Other Ambulatory Visit: Payer: Self-pay

## 2011-10-30 DIAGNOSIS — I10 Essential (primary) hypertension: Secondary | ICD-10-CM

## 2011-10-30 DIAGNOSIS — E119 Type 2 diabetes mellitus without complications: Secondary | ICD-10-CM

## 2011-10-30 MED ORDER — VALSARTAN 320 MG PO TABS: 320.0000 mg | ORAL_TABLET | Freq: Every day | ORAL | Status: DC

## 2011-10-30 MED ORDER — PIOGLITAZONE HCL 30 MG PO TABS: 30.0000 mg | ORAL_TABLET | Freq: Every day | ORAL | Status: DC

## 2011-10-30 NOTE — Telephone Encounter (Signed)
Received email from pt requesting that actos, and diovan are sent to [facility]

## 2011-11-12 ENCOUNTER — Encounter: Payer: Self-pay | Admitting: Internal Medicine

## 2011-11-12 ENCOUNTER — Other Ambulatory Visit (INDEPENDENT_AMBULATORY_CARE_PROVIDER_SITE_OTHER): Payer: 59

## 2011-11-12 ENCOUNTER — Ambulatory Visit (INDEPENDENT_AMBULATORY_CARE_PROVIDER_SITE_OTHER): Payer: 59 | Admitting: Internal Medicine

## 2011-11-12 VITALS — BP 122/82 | HR 80 | Temp 97.6°F | Resp 16 | Wt 179.0 lb

## 2011-11-12 DIAGNOSIS — E785 Hyperlipidemia, unspecified: Secondary | ICD-10-CM

## 2011-11-12 DIAGNOSIS — I10 Essential (primary) hypertension: Secondary | ICD-10-CM

## 2011-11-12 DIAGNOSIS — E119 Type 2 diabetes mellitus without complications: Secondary | ICD-10-CM

## 2011-11-12 DIAGNOSIS — D72829 Elevated white blood cell count, unspecified: Secondary | ICD-10-CM

## 2011-11-12 DIAGNOSIS — H109 Unspecified conjunctivitis: Secondary | ICD-10-CM

## 2011-11-12 LAB — CBC WITH DIFFERENTIAL/PLATELET
Basophils Absolute: 0.1 10*3/uL (ref 0.0–0.1)
Eosinophils Absolute: 0.3 10*3/uL (ref 0.0–0.7)
Lymphocytes Relative: 34.8 % (ref 12.0–46.0)
MCHC: 32.4 g/dL (ref 30.0–36.0)
Neutro Abs: 7.5 10*3/uL (ref 1.4–7.7)
Neutrophils Relative %: 53.6 % (ref 43.0–77.0)
Platelets: 371 10*3/uL (ref 150.0–400.0)
RDW: 14.2 % (ref 11.5–14.6)

## 2011-11-12 LAB — COMPREHENSIVE METABOLIC PANEL
ALT: 19 U/L (ref 0–35)
AST: 16 U/L (ref 0–37)
Calcium: 9.7 mg/dL (ref 8.4–10.5)
Chloride: 103 mEq/L (ref 96–112)
Creatinine, Ser: 0.7 mg/dL (ref 0.4–1.2)

## 2011-11-12 MED ORDER — NEOMYCIN-POLYMYXIN-HC 3.5-10000-1 OP SUSP: 1.0000 [drp] | Freq: Three times a day (TID) | OPHTHALMIC | Status: AC

## 2011-11-12 MED ORDER — INSULIN GLARGINE 100 UNIT/ML ~~LOC~~ SOLN: 30.0000 [IU] | Freq: Every day | SUBCUTANEOUS | Status: DC

## 2011-11-12 MED ORDER — COLESEVELAM HCL 3.75 G PO PACK: 1.0000 | PACK | Freq: Every day | ORAL | Status: DC

## 2011-11-12 MED ORDER — NEOMYCIN-POLYMYXIN-HC 3.5-10000-1 OP SUSP: 1.0000 [drp] | Freq: Three times a day (TID) | OPHTHALMIC | Status: DC

## 2011-11-12 NOTE — Assessment & Plan Note (Signed)
I will recheck her CBC today to see if this is a stable issue

## 2011-11-12 NOTE — Assessment & Plan Note (Signed)
Her BP is well controlled, I will check her lytes and renal function today 

## 2011-11-12 NOTE — Patient Instructions (Signed)
Diabetes, Type 2 Diabetes is a long-lasting (chronic) disease. In type 2 diabetes, the pancreas does not make enough insulin (a hormone), and the body does not respond normally to the insulin that is made. This type of diabetes was also previously called adult-onset diabetes. It usually occurs after the age of 69, but it can occur at any age.  CAUSES  Type 2 diabetes happens because the pancreasis not making enough insulin or your body has trouble using the insulin that your pancreas does make properly. SYMPTOMS   Drinking more than usual.   Urinating more than usual.   Blurred vision.   Dry, itchy skin.   Frequent infections.   Feeling more tired than usual (fatigue).  DIAGNOSIS The diagnosis of type 2 diabetes is usually made by one of the following tests:  Fasting blood glucose test. You will not eat for at least 8 hours and then take a blood test.   Random blood glucose test. Your blood glucose (sugar) is checked at any time of the day regardless of when you ate.   Oral glucose tolerance test (OGTT). Your blood glucose is measured after you have not eaten (fasted) and then after you drink a glucose containing beverage.  TREATMENT   Healthy eating.   Exercise.   Medicine, if needed.   Monitoring blood glucose.   Seeing your caregiver regularly.  HOME CARE INSTRUCTIONS   Check your blood glucose at least once a day. More frequent monitoring may be necessary, depending on your medicines and on how well your diabetes is controlled. Your caregiver will advise you.   Take your medicine as directed by your caregiver.   Do not smoke.   Make wise food choices. Ask your caregiver for information. Weight loss can improve your diabetes.   Learn about low blood glucose (hypoglycemia) and how to treat it.   Get your eyes checked regularly.   Have a yearly physical exam. Have your blood pressure checked and your blood and urine tested.   Wear a pendant or bracelet saying  that you have diabetes.   Check your feet every night for cuts, sores, blisters, and redness. Let your caregiver know if you have any problems.  SEEK MEDICAL CARE IF:   You have problems keeping your blood glucose in target range.   You have problems with your medicines.   You have symptoms of an illness that do not improve after 24 hours.   You have a sore or wound that is not healing.   You notice a change in vision or a new problem with your vision.   You have a fever.  MAKE SURE YOU:  Understand these instructions.   Will watch your condition.   Will get help right away if you are not doing well or get worse.  Document Released: 06/11/2005 Document Revised: 05/31/2011 Document Reviewed: 11/27/2010 [facility] Patient Information 2012 [place], [place].Conjunctivitis Conjunctivitis is commonly called "pink eye." Conjunctivitis can be caused by bacterial or viral infection, allergies, or injuries. There is usually redness of the lining of the eye, itching, discomfort, and sometimes discharge. There may be deposits of matter along the eyelids. A viral infection usually causes a watery discharge, while a bacterial infection causes a yellowish, thick discharge. Pink eye is very contagious and spreads by direct contact. You may be given antibiotic eyedrops as part of your treatment. Before using your eye medicine, remove all drainage from the eye by washing gently with warm water and cotton balls. Continue to use the  medication until you have awakened 2 mornings in a row without discharge from the eye. Do not rub your eye. This increases the irritation and helps spread infection. Use separate towels from other household members. Wash your hands with soap and water before and after touching your eyes. Use cold compresses to reduce pain and sunglasses to relieve irritation from light. Do not wear contact lenses or wear eye makeup until the infection is gone. SEEK MEDICAL CARE IF:   Your symptoms  are not better after 3 days of treatment.   You have increased pain or trouble seeing.   The outer eyelids become very red or swollen.  Document Released: 07/19/2004 Document Revised: 05/31/2011 Document Reviewed: 06/11/2005 [facility] Patient Information 2012 [place], [place].

## 2011-11-12 NOTE — Progress Notes (Signed)
Subjective:    Patient ID: Margaret Norman, female    DOB: 1950-07-14, 61 y.o.   MRN: [id number]  Conjunctivitis  The current episode started 3 to 5 days ago. The onset was gradual. The problem occurs occasionally. The problem has been gradually worsening. The problem is moderate. The symptoms are relieved by nothing. Associated symptoms include eye itching, eye discharge and eye redness. Pertinent negatives include no orthopnea, no fever, no decreased vision, no double vision, no photophobia, no abdominal pain, no constipation, no diarrhea, no nausea, no vomiting, no congestion, no ear discharge, no ear pain, no headaches, no hearing loss, no mouth sores, no rhinorrhea, no sore throat, no stridor, no swollen glands, no cough, no URI, no wheezing, no rash and no eye pain. There is pain in both eyes. There were no sick contacts. She has received no recent medical care.      Review of Systems  Constitutional: Negative for fever, chills, diaphoresis, activity change, appetite change, fatigue and unexpected weight change.  HENT: Negative.  Negative for hearing loss, ear pain, congestion, sore throat, rhinorrhea, mouth sores and ear discharge.   Eyes: Positive for discharge, redness and itching. Negative for double vision, photophobia, pain and visual disturbance.  Respiratory: Negative.  Negative for cough, wheezing and stridor.   Cardiovascular: Negative.  Negative for orthopnea.  Gastrointestinal: Negative.  Negative for nausea, vomiting, abdominal pain, diarrhea and constipation.  Genitourinary: Negative.   Musculoskeletal: Negative.   Skin: Negative for color change, pallor, rash and wound.  Neurological: Negative.  Negative for headaches.  Hematological: Negative for adenopathy. Does not bruise/bleed easily.       Objective:   Physical Exam  Vitals reviewed. Constitutional: She is oriented to person, place, and time. She appears well-developed and well-nourished. No distress.  HENT:    Head: Normocephalic and atraumatic.  Mouth/Throat: Oropharynx is clear and moist. No oropharyngeal exudate.  Eyes: EOM and lids are normal. Pupils are equal, round, and reactive to light. Right eye exhibits no chemosis, no discharge, no exudate and no hordeolum. No foreign body present in the right eye. Left eye exhibits no chemosis, no discharge, no exudate and no hordeolum. No foreign body present in the left eye. Right conjunctiva is injected. Right conjunctiva has no hemorrhage. Left conjunctiva is injected. Left conjunctiva has no hemorrhage. No scleral icterus. Right eye exhibits normal extraocular motion and no nystagmus. Left eye exhibits normal extraocular motion and no nystagmus.    Neck: Normal range of motion. Neck supple. No JVD present. No tracheal deviation present. No thyromegaly present.  Cardiovascular: Normal rate, regular rhythm and intact distal pulses.  Exam reveals no gallop and no friction rub.   No murmur heard. Pulmonary/Chest: Effort normal and breath sounds normal. No stridor. No respiratory distress. She has no wheezes. She has no rales. She exhibits no tenderness.  Abdominal: Soft. Bowel sounds are normal. She exhibits no distension and no mass. There is no tenderness. There is no rebound and no guarding.  Musculoskeletal: Normal range of motion. She exhibits no edema and no tenderness.  Lymphadenopathy:    She has no cervical adenopathy.  Neurological: She is oriented to person, place, and time.  Skin: Skin is warm and dry. No rash noted. She is not diaphoretic. No erythema. No pallor.  Psychiatric: She has a normal mood and affect. Her behavior is normal. Judgment and thought content normal.      Lab Results  Component Value Date   WBC 13.9* 04/09/2011   HGB 12.1  04/09/2011   HCT 36.7 04/09/2011   PLT 440.0* 04/09/2011   GLUCOSE 175* 08/07/2011   CHOL 141 08/07/2011   TRIG 142.0 08/07/2011   HDL 42.90 08/07/2011   LDLCALC 70 08/07/2011   ALT 28 08/07/2011    AST 21 08/07/2011   NA 140 08/07/2011   K 4.1 08/07/2011   CL 104 08/07/2011   CREATININE 0.7 08/07/2011   BUN 16 08/07/2011   CO2 26 08/07/2011   TSH 1.21 05/26/2010   HGBA1C 10.7* 08/07/2011   MICROALBUR 0.5 11/15/2008      Assessment & Plan:

## 2011-11-12 NOTE — Assessment & Plan Note (Addendum)
Her last a1c was high, she is not willing to start insulin yet, I will recheck her a1c today and if it is still high she will reconsider, she will restart welchol  Late note, her a1c is up to 11.7% so I have asked her to start lantus

## 2011-11-12 NOTE — Assessment & Plan Note (Signed)
She will start cortisporin ophth susp

## 2011-11-12 NOTE — Progress Notes (Signed)
Addended by: Etta Grandchild on: 11/12/2011 05:42 PM   Modules accepted: Orders

## 2011-11-12 NOTE — Assessment & Plan Note (Signed)
She is doing well on crestor 

## 2011-11-16 ENCOUNTER — Other Ambulatory Visit: Payer: Self-pay | Admitting: Internal Medicine

## 2011-12-04 ENCOUNTER — Ambulatory Visit: Payer: 59 | Admitting: Internal Medicine

## 2012-01-02 ENCOUNTER — Other Ambulatory Visit: Payer: Self-pay

## 2012-01-02 DIAGNOSIS — E119 Type 2 diabetes mellitus without complications: Secondary | ICD-10-CM

## 2012-01-02 NOTE — Telephone Encounter (Signed)
A user error has taken place: encounter opened in error, closed for administrative reasons.

## 2012-01-03 ENCOUNTER — Telehealth: Payer: Self-pay | Admitting: *Deleted

## 2012-01-03 DIAGNOSIS — E119 Type 2 diabetes mellitus without complications: Secondary | ICD-10-CM

## 2012-01-03 MED ORDER — INSULIN GLARGINE 100 UNIT/ML ~~LOC~~ SOLN: 30.0000 [IU] | Freq: Every day | SUBCUTANEOUS | Status: DC

## 2012-01-03 NOTE — Telephone Encounter (Signed)
Left msg on vm RTC to nurse. Needing insulin to be sent to [facility]... 01/03/12@1 :44pm/LMB

## 2012-01-15 ENCOUNTER — Other Ambulatory Visit: Payer: Self-pay

## 2012-01-15 DIAGNOSIS — E119 Type 2 diabetes mellitus without complications: Secondary | ICD-10-CM

## 2012-01-15 MED ORDER — INSULIN GLARGINE 100 UNIT/ML ~~LOC~~ SOLN: 30.0000 [IU] | Freq: Every day | SUBCUTANEOUS | Status: DC

## 2012-01-15 NOTE — Telephone Encounter (Signed)
Pt called stating she has not been able to get her insulin form mail order pharmacy - [facility] advised that Rx was not received but per EPIC it was sent 01/03/2012. Pt advised of same via VM and that I would resent Rx.

## 2012-01-29 ENCOUNTER — Other Ambulatory Visit: Payer: Self-pay | Admitting: Internal Medicine

## 2012-02-11 ENCOUNTER — Encounter: Payer: Self-pay | Admitting: Internal Medicine

## 2012-02-11 ENCOUNTER — Other Ambulatory Visit (INDEPENDENT_AMBULATORY_CARE_PROVIDER_SITE_OTHER): Payer: 59

## 2012-02-11 ENCOUNTER — Ambulatory Visit (INDEPENDENT_AMBULATORY_CARE_PROVIDER_SITE_OTHER): Payer: 59 | Admitting: Internal Medicine

## 2012-02-11 VITALS — BP 130/76 | HR 69 | Temp 97.6°F | Resp 16 | Wt 186.0 lb

## 2012-02-11 DIAGNOSIS — D72829 Elevated white blood cell count, unspecified: Secondary | ICD-10-CM

## 2012-02-11 DIAGNOSIS — E119 Type 2 diabetes mellitus without complications: Secondary | ICD-10-CM

## 2012-02-11 DIAGNOSIS — I10 Essential (primary) hypertension: Secondary | ICD-10-CM

## 2012-02-11 LAB — HEMOGLOBIN A1C: Hgb A1c MFr Bld: 11.9 % — ABNORMAL HIGH (ref 4.6–6.5)

## 2012-02-11 LAB — COMPREHENSIVE METABOLIC PANEL
ALT: 15 U/L (ref 0–35)
AST: 16 U/L (ref 0–37)
Calcium: 9.7 mg/dL (ref 8.4–10.5)
Chloride: 106 mEq/L (ref 96–112)
Creatinine, Ser: 0.6 mg/dL (ref 0.4–1.2)
Potassium: 4.3 mEq/L (ref 3.5–5.1)

## 2012-02-11 MED ORDER — CANAGLIFLOZIN 300 MG PO TABS: 1.0000 | ORAL_TABLET | Freq: Every day | ORAL | Status: DC

## 2012-02-11 MED ORDER — INSULIN PEN NEEDLE 31G X 8 MM MISC: Status: DC

## 2012-02-11 NOTE — Progress Notes (Signed)
Subjective:    Patient ID: Margaret Norman, female    DOB: 29-Jul-1950, 61 y.o.   MRN: [id number]  Diabetes She presents for her follow-up diabetic visit. She has type 2 diabetes mellitus. There are no hypoglycemic associated symptoms. Pertinent negatives for hypoglycemia include no pallor. Associated symptoms include polydipsia, polyphagia and polyuria. Pertinent negatives for diabetes include no blurred vision, no chest pain, no fatigue, no foot paresthesias, no foot ulcerations, no visual change, no weakness and no weight loss. There are no hypoglycemic complications. Symptoms are stable. Risk factors for coronary artery disease include no known risk factors. Current diabetic treatment includes intensive insulin program, insulin injections and oral agent (triple therapy). She is compliant with treatment most of the time (she has not been using victoza). Her weight is stable. She is following a generally healthy diet. Meal planning includes avoidance of concentrated sweets. She has not had a previous visit with a dietician. She participates in exercise intermittently. There is no change in her home blood glucose trend. Her breakfast blood glucose range is generally 140-180 mg/dl. Her lunch blood glucose range is generally 180-200 mg/dl. Her dinner blood glucose range is generally >200 mg/dl. Her overall blood glucose range is >200 mg/dl. An ACE inhibitor/angiotensin II receptor blocker is being taken. She does not see a podiatrist.Eye exam is current.      Review of Systems  Constitutional: Negative for fever, chills, weight loss, diaphoresis, activity change, appetite change, fatigue and unexpected weight change.  HENT: Negative.   Eyes: Negative.  Negative for blurred vision.  Respiratory: Negative for cough, chest tightness, shortness of breath, wheezing and stridor.   Cardiovascular: Negative for chest pain, palpitations and leg swelling.  Gastrointestinal: Negative for nausea, vomiting,  abdominal pain, diarrhea, constipation and blood in stool.  Genitourinary: Positive for polyuria. Negative for dysuria, frequency and hematuria.  Musculoskeletal: Negative for myalgias, back pain, joint swelling, arthralgias and gait problem.  Skin: Negative for color change, pallor, rash and wound.  Neurological: Negative.  Negative for weakness.  Hematological: Positive for polydipsia and polyphagia. Negative for adenopathy. Does not bruise/bleed easily.  Psychiatric/Behavioral: Negative.        Objective:   Physical Exam  Vitals reviewed. Constitutional: She is oriented to person, place, and time. She appears well-developed and well-nourished. No distress.  HENT:  Head: Normocephalic and atraumatic.  Mouth/Throat: Oropharynx is clear and moist. No oropharyngeal exudate.  Eyes: Conjunctivae are normal. Right eye exhibits no discharge. Left eye exhibits no discharge. No scleral icterus.  Neck: Normal range of motion. Neck supple. No JVD present. No tracheal deviation present. No thyromegaly present.  Cardiovascular: Normal rate, regular rhythm, normal heart sounds and intact distal pulses.  Exam reveals no gallop and no friction rub.   No murmur heard. Pulmonary/Chest: Effort normal and breath sounds normal. No stridor. No respiratory distress. She has no wheezes. She has no rales. She exhibits no tenderness.  Abdominal: Soft. Bowel sounds are normal. She exhibits no distension and no mass. There is no tenderness. There is no rebound and no guarding.  Musculoskeletal: Normal range of motion. She exhibits no edema and no tenderness.  Lymphadenopathy:    She has no cervical adenopathy.  Neurological: She is oriented to person, place, and time.  Skin: Skin is warm and dry. No rash noted. She is not diaphoretic. No erythema. No pallor.  Psychiatric: She has a normal mood and affect. Her behavior is normal. Judgment and thought content normal.      Lab Results  Component Value Date    WBC 13.9* 11/12/2011   HGB 13.0 11/12/2011   HCT 40.1 11/12/2011   PLT 371.0 11/12/2011   GLUCOSE 351* 11/12/2011   CHOL 141 08/07/2011   TRIG 142.0 08/07/2011   HDL 42.90 08/07/2011   LDLCALC 70 08/07/2011   ALT 19 11/12/2011   AST 16 11/12/2011   NA 138 11/12/2011   K 4.7 11/12/2011   CL 103 11/12/2011   CREATININE 0.7 11/12/2011   BUN 14 11/12/2011   CO2 26 11/12/2011   TSH 1.21 05/26/2010   HGBA1C 11.7* 11/12/2011   MICROALBUR 0.5 11/15/2008      Assessment & Plan:

## 2012-02-11 NOTE — Patient Instructions (Addendum)

## 2012-02-12 ENCOUNTER — Encounter: Payer: Self-pay | Admitting: Internal Medicine

## 2012-02-12 NOTE — Assessment & Plan Note (Signed)
Her blood sugars remain very high. I asked her to start invokana today but she has refused to do this. She will restart victoza and will work on her lifestyle modifications.

## 2012-02-12 NOTE — Assessment & Plan Note (Signed)
Her BP is well controlled, I will check her lytes and renal function 

## 2012-02-12 NOTE — Assessment & Plan Note (Signed)
This is a stable issue for her

## 2012-04-22 ENCOUNTER — Other Ambulatory Visit: Payer: Self-pay | Admitting: Internal Medicine

## 2012-06-12 ENCOUNTER — Encounter: Payer: Self-pay | Admitting: Internal Medicine

## 2012-06-12 ENCOUNTER — Ambulatory Visit (INDEPENDENT_AMBULATORY_CARE_PROVIDER_SITE_OTHER): Payer: 59 | Admitting: Internal Medicine

## 2012-06-12 ENCOUNTER — Other Ambulatory Visit (INDEPENDENT_AMBULATORY_CARE_PROVIDER_SITE_OTHER): Payer: 59

## 2012-06-12 VITALS — BP 124/86 | HR 96 | Temp 98.0°F | Resp 16 | Wt 187.0 lb

## 2012-06-12 DIAGNOSIS — E119 Type 2 diabetes mellitus without complications: Secondary | ICD-10-CM

## 2012-06-12 DIAGNOSIS — Z23 Encounter for immunization: Secondary | ICD-10-CM

## 2012-06-12 DIAGNOSIS — I10 Essential (primary) hypertension: Secondary | ICD-10-CM

## 2012-06-12 DIAGNOSIS — D72829 Elevated white blood cell count, unspecified: Secondary | ICD-10-CM

## 2012-06-12 DIAGNOSIS — R079 Chest pain, unspecified: Secondary | ICD-10-CM

## 2012-06-12 DIAGNOSIS — R9431 Abnormal electrocardiogram [ECG] [EKG]: Secondary | ICD-10-CM

## 2012-06-12 LAB — URINALYSIS, ROUTINE W REFLEX MICROSCOPIC
Bilirubin Urine: NEGATIVE
Nitrite: NEGATIVE
Specific Gravity, Urine: >=1.03 (ref 1.000–1.030)
pH: 6 (ref 5.0–8.0)

## 2012-06-12 LAB — CBC WITH DIFFERENTIAL/PLATELET
Basophils Absolute: 0.1 10*3/uL (ref 0.0–0.1)
Eosinophils Absolute: 0.2 10*3/uL (ref 0.0–0.7)
HCT: 38.9 % (ref 36.0–46.0)
Hemoglobin: 13 g/dL (ref 12.0–15.0)
Lymphs Abs: 4.9 10*3/uL — ABNORMAL HIGH (ref 0.7–4.0)
MCHC: 33.3 g/dL (ref 30.0–36.0)
MCV: 87.8 fl (ref 78.0–100.0)
Monocytes Absolute: 1.4 10*3/uL — ABNORMAL HIGH (ref 0.1–1.0)
Monocytes Relative: 8.9 % (ref 3.0–12.0)
Neutro Abs: 9 10*3/uL — ABNORMAL HIGH (ref 1.4–7.7)
Platelets: 412 10*3/uL — ABNORMAL HIGH (ref 150.0–400.0)
RDW: 13.5 % (ref 11.5–14.6)

## 2012-06-12 LAB — COMPREHENSIVE METABOLIC PANEL
Albumin: 4 g/dL (ref 3.5–5.2)
BUN: 18 mg/dL (ref 6–23)
CO2: 26 mEq/L (ref 19–32)
GFR: 99.92 mL/min (ref >60.00)
Glucose, Bld: 93 mg/dL (ref 70–99)
Sodium: 141 mEq/L (ref 135–145)
Total Bilirubin: 0.2 mg/dL — ABNORMAL LOW (ref 0.3–1.2)
Total Protein: 7.6 g/dL (ref 6.0–8.3)

## 2012-06-12 LAB — BRAIN NATRIURETIC PEPTIDE: Pro B Natriuretic peptide (BNP): 30 pg/mL (ref 0.0–100.0)

## 2012-06-12 NOTE — Patient Instructions (Signed)
Chest Pain (Nonspecific) It is often hard to give a specific diagnosis for the cause of chest pain. There is always a chance that your pain could be related to something serious, such as a heart attack or a blood clot in the lungs. You need to follow up with your caregiver for further evaluation. CAUSES   Heartburn.  Pneumonia or bronchitis.  Anxiety or stress.  Inflammation around your heart (pericarditis) or lung (pleuritis or pleurisy).  A blood clot in the lung.  A collapsed lung (pneumothorax). It can develop suddenly on its own (spontaneous pneumothorax) or from injury (trauma) to the chest.  Shingles infection (herpes zoster virus). The chest wall is composed of bones, muscles, and cartilage. Any of these can be the source of the pain.  The bones can be bruised by injury.  The muscles or cartilage can be strained by coughing or overwork.  The cartilage can be affected by inflammation and become sore (costochondritis). DIAGNOSIS  Lab tests or other studies, such as X-rays, electrocardiography, stress testing, or cardiac imaging, may be needed to find the cause of your pain.  TREATMENT   Treatment depends on what may be causing your chest pain. Treatment may include:  Acid blockers for heartburn.  Anti-inflammatory medicine.  Pain medicine for inflammatory conditions.  Antibiotics if an infection is present.  You may be advised to change lifestyle habits. This includes stopping smoking and avoiding alcohol, caffeine, and chocolate.  You may be advised to keep your head raised (elevated) when sleeping. This reduces the chance of acid going backward from your stomach into your esophagus.  Most of the time, nonspecific chest pain will improve within 2 to 3 days with rest and mild pain medicine. HOME CARE INSTRUCTIONS   If antibiotics were prescribed, take your antibiotics as directed. Finish them even if you start to feel better.  For the next few days, avoid physical  activities that bring on chest pain. Continue physical activities as directed.  Do not smoke.  Avoid drinking alcohol.  Only take over-the-counter or prescription medicine for pain, discomfort, or fever as directed by your caregiver.  Follow your caregiver's suggestions for further testing if your chest pain does not go away.  Keep any follow-up appointments you made. If you do not go to an appointment, you could develop lasting (chronic) problems with pain. If there is any problem keeping an appointment, you must call to reschedule. SEEK MEDICAL CARE IF:   You think you are having problems from the medicine you are taking. Read your medicine instructions carefully.  Your chest pain does not go away, even after treatment.  You develop a rash with blisters on your chest. SEEK IMMEDIATE MEDICAL CARE IF:   You have increased chest pain or pain that spreads to your arm, neck, jaw, back, or abdomen.  You develop shortness of breath, an increasing cough, or you are coughing up blood.  You have severe back or abdominal pain, feel nauseous, or vomit.  You develop severe weakness, fainting, or chills.  You have a fever. THIS IS AN EMERGENCY. Do not wait to see if the pain will go away. Get medical help at once. Call your local emergency services (911 in [place] Do not drive yourself to the hospital. MAKE SURE YOU:   Understand these instructions.  Will watch your condition.  Will get help right away if you are not doing well or get worse. Document Released: 03/21/2005 Document Revised: 09/03/2011 Document Reviewed: 01/15/2008 Exit[employer] Patient Information 2013 [employer].  

## 2012-06-15 NOTE — Assessment & Plan Note (Signed)
I will check her A1C today and will address if needed

## 2012-06-15 NOTE — Progress Notes (Signed)
  Subjective:    Patient ID: Margaret Norman, female    DOB: [birth year], 61 y.o.   MRN: [id number]  Chest Pain  This is a recurrent problem. The current episode started more than 1 year ago. The onset quality is gradual. The problem occurs intermittently. The problem has been unchanged. The pain is present in the substernal region. The pain is at a severity of 1/10. The pain is mild. The quality of the pain is described as tightness. The pain does not radiate. Pertinent negatives include no abdominal pain, back pain, claudication, cough, diaphoresis, dizziness, exertional chest pressure, fever, headaches, hemoptysis, irregular heartbeat, leg pain, lower extremity edema, malaise/fatigue, nausea, near-syncope, numbness, orthopnea, palpitations, PND, shortness of breath, sputum production, syncope, vomiting or weakness. She has tried nothing for the symptoms. The treatment provided no relief.  Pertinent negatives for past medical history include no seizures.      Review of Systems  Constitutional: Negative for fever, chills, malaise/fatigue, diaphoresis, activity change, appetite change, fatigue and unexpected weight change.  HENT: Negative.   Eyes: Negative.   Respiratory: Negative for apnea, cough, hemoptysis, sputum production, choking, chest tightness, shortness of breath, wheezing and stridor.   Cardiovascular: Positive for chest pain. Negative for palpitations, orthopnea, claudication, leg swelling, syncope, PND and near-syncope.  Gastrointestinal: Negative for nausea, vomiting, abdominal pain, diarrhea and constipation.  Genitourinary: Negative.   Musculoskeletal: Negative for myalgias, back pain, joint swelling, arthralgias and gait problem.  Skin: Negative for color change, pallor, rash and wound.  Neurological: Negative for dizziness, tremors, seizures, syncope, facial asymmetry, speech difficulty, weakness, light-headedness, numbness and headaches.  Hematological: Negative for  adenopathy. Does not bruise/bleed easily.  Psychiatric/Behavioral: Negative.        Objective:   Physical Exam  Vitals reviewed. Constitutional: She is oriented to person, place, and time. She appears well-developed and well-nourished. No distress.  HENT:  Head: Normocephalic and atraumatic.  Mouth/Throat: No oropharyngeal exudate.  Eyes: Conjunctivae normal are normal. Right eye exhibits no discharge. Left eye exhibits no discharge. No scleral icterus.  Neck: Normal range of motion. Neck supple. No JVD present. No tracheal deviation present. No thyromegaly present.  Cardiovascular: Normal rate, regular rhythm, normal heart sounds and intact distal pulses.  Exam reveals no gallop and no friction rub.   No murmur heard. Pulmonary/Chest: Effort normal and breath sounds normal. No stridor. No respiratory distress. She has no wheezes. She has no rales. She exhibits no tenderness.  Abdominal: Soft. Bowel sounds are normal. She exhibits no distension and no mass. There is no tenderness. There is no rebound and no guarding.  Musculoskeletal: Normal range of motion. She exhibits no edema and no tenderness.  Lymphadenopathy:    She has no cervical adenopathy.  Neurological: She is oriented to person, place, and time.  Skin: Skin is warm and dry. No rash noted. She is not diaphoretic. No erythema. No pallor.  Psychiatric: She has a normal mood and affect. Her behavior is normal. Judgment and thought content normal.          Assessment & Plan:

## 2012-06-15 NOTE — Assessment & Plan Note (Signed)
Her BP is well controlled I will check her lytes and renal function today 

## 2012-06-15 NOTE — Assessment & Plan Note (Signed)
She has atypical CP and an EKG that shows LBBB and LAE, I will check her labs today to look for ischemia and chf. I have asked her to see cardiology since she has many risk factors for CAD and CHF.

## 2012-06-15 NOTE — Assessment & Plan Note (Signed)
Her EKG shows LBBB and LAE so I have asked her to see cardiology

## 2012-06-16 ENCOUNTER — Encounter: Payer: Self-pay | Admitting: Internal Medicine

## 2012-06-16 ENCOUNTER — Ambulatory Visit (INDEPENDENT_AMBULATORY_CARE_PROVIDER_SITE_OTHER): Payer: 59 | Admitting: Internal Medicine

## 2012-06-16 VITALS — BP 122/82 | HR 94 | Temp 98.2°F | Ht 63.0 in | Wt 188.0 lb

## 2012-06-16 DIAGNOSIS — R05 Cough: Secondary | ICD-10-CM

## 2012-06-16 DIAGNOSIS — J019 Acute sinusitis, unspecified: Secondary | ICD-10-CM

## 2012-06-16 DIAGNOSIS — R059 Cough, unspecified: Secondary | ICD-10-CM

## 2012-06-16 MED ORDER — FLUTICASONE PROPIONATE 50 MCG/ACT NA SUSP: 2.0000 | Freq: Every day | NASAL | Status: DC

## 2012-06-16 MED ORDER — AMOXICILLIN-POT CLAVULANATE 875-125 MG PO TABS: 1.0000 | ORAL_TABLET | Freq: Two times a day (BID) | ORAL | Status: DC

## 2012-06-16 MED ORDER — HYDROCODONE-HOMATROPINE 5-1.5 MG/5ML PO SYRP: 5.0000 mL | ORAL_SOLUTION | Freq: Three times a day (TID) | ORAL | Status: DC | PRN

## 2012-06-16 NOTE — Patient Instructions (Addendum)

## 2012-06-16 NOTE — Progress Notes (Signed)
HPI  Pt presents to the clinic with 3 day history of sinus pain and pressure, headache and ear pain. She denies fever, chills or body aches. She also c/o of a dry cough.She has taken some OTC Nyquil and Tylenol cold and flue without relief. The symptoms are getting worse every day. She does not have a history of allergies or asthma. She has had sick contacts.  Review of Systems    Past Medical History  Diagnosis Date  . Type II or unspecified type diabetes mellitus without mention of complication, not stated as uncontrolled   . Depression   . GERD (gastroesophageal reflux disease)   . Hyperlipidemia   . Nephrolithiasis   . HTN (hypertension)   . Gallstones 11-06    Family History  Problem Relation Age of Onset  . Stroke Maternal Grandfather   . Coronary artery disease Other   . Skin cancer Other   . Heart attack Other   . Hypertension Other   . Hyperlipidemia Other   . Diabetes Other   . Cancer Other     skin    History   Social History  . Marital Status: Married    Spouse Name: Phenix Grein    Number of Children: N/A  . 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 Smoking status: Never Smoker   . Smokeless tobacco: Never Used  . Alcohol Use: No  . Drug Use: No  . Sexually Active: Not Currently   Other Topics Concern  . Not on file   Social History Narrative   Regular Exercise -  NO    No Known Allergies   Constitutional: Positive headache, fatigue. Denies fever or  abrupt weight changes.  HEENT:  Positive eye pain, pressure behind the eyes, facial pain, nasal congestion and sore throat. Denies eye redness, ear pain, ringing in the ears, wax buildup, runny nose or bloody nose. Respiratory: Positive cough. Denies difficulty breathing or shortness of breath.  Cardiovascular: Denies chest pain, chest tightness, palpitations or swelling in the hands or feet.   No other specific complaints in a  complete review of systems (except as listed in HPI above).  Objective:    General: Appears her stated age, well developed, well nourished in NAD. HEENT: Head: normal shape and size, sinuses tender to palpation; Eyes: sclera white, no icterus, conjunctiva pink, PERRLA and EOMs intact; Ears: Tm's gray and intact, normal light reflex; Nose: mucosa pink and moist, septum midline; Throat/Mouth: + PND. Teeth present, mucosa pink and moist, no exudate noted, no lesions or ulcerations noted.  Neck: Mild cervical lymphadenopathy. Neck supple, trachea midline. No massses, lumps or thyromegaly present.  Cardiovascular: Normal rate and rhythm. S1,S2 noted.  No murmur, rubs or gallops noted. No JVD or BLE edema. No carotid bruits noted. Pulmonary/Chest: Normal effort and positive vesicular breath sounds. No respiratory distress. No wheezes, rales or ronchi noted.      Assessment & Plan:   Acute bacterial sinusitis  Can use a Neti Pot which can be purchased from your local drug store. Flonase 2 sprays each nostril for 3 days and then as needed. Augmentin BID for 10 days Hycodan cough syrup at night  RTC as needed or if symptoms persist.

## 2012-06-17 ENCOUNTER — Encounter: Payer: Self-pay | Admitting: Cardiology

## 2012-06-17 DIAGNOSIS — E785 Hyperlipidemia, unspecified: Secondary | ICD-10-CM | POA: Insufficient documentation

## 2012-06-17 DIAGNOSIS — K8020 Calculus of gallbladder without cholecystitis without obstruction: Secondary | ICD-10-CM | POA: Insufficient documentation

## 2012-06-17 DIAGNOSIS — K219 Gastro-esophageal reflux disease without esophagitis: Secondary | ICD-10-CM | POA: Insufficient documentation

## 2012-06-17 DIAGNOSIS — N200 Calculus of kidney: Secondary | ICD-10-CM | POA: Insufficient documentation

## 2012-06-17 DIAGNOSIS — I10 Essential (primary) hypertension: Secondary | ICD-10-CM | POA: Insufficient documentation

## 2012-06-17 DIAGNOSIS — F329 Major depressive disorder, single episode, unspecified: Secondary | ICD-10-CM | POA: Insufficient documentation

## 2012-06-17 DIAGNOSIS — I447 Left bundle-branch block, unspecified: Secondary | ICD-10-CM | POA: Insufficient documentation

## 2012-06-19 ENCOUNTER — Encounter: Payer: Self-pay | Admitting: Cardiology

## 2012-06-19 ENCOUNTER — Ambulatory Visit (INDEPENDENT_AMBULATORY_CARE_PROVIDER_SITE_OTHER): Payer: 59 | Admitting: Cardiology

## 2012-06-19 VITALS — BP 146/80 | HR 95 | Ht 63.0 in | Wt 186.2 lb

## 2012-06-19 DIAGNOSIS — E785 Hyperlipidemia, unspecified: Secondary | ICD-10-CM

## 2012-06-19 DIAGNOSIS — R079 Chest pain, unspecified: Secondary | ICD-10-CM

## 2012-06-19 DIAGNOSIS — I10 Essential (primary) hypertension: Secondary | ICD-10-CM

## 2012-06-19 DIAGNOSIS — I447 Left bundle-branch block, unspecified: Secondary | ICD-10-CM

## 2012-06-19 DIAGNOSIS — IMO0001 Reserved for inherently not codable concepts without codable children: Secondary | ICD-10-CM

## 2012-06-19 NOTE — Patient Instructions (Addendum)
Your physician has requested that you have an adenosine myoview. For further information please visit [email or web address] Please follow instruction sheet, as given.  Your physician recommends that you schedule a follow-up appointment in: after [facility]

## 2012-06-19 NOTE — Progress Notes (Signed)
HPI   Patient is seen today for new cardiology evaluation. She has a cough. She also has fatigue and some chest heaviness. In addition she has significant risk factors for coronary disease. She has diabetes and hypertension. She is overweight. She's had some intermittent chest discomfort for more than a year. It occurs intermittently. It does not radiate. It is not necessarily related to exertion. It was noted that she has left bundle branch block on her EKG. She was referred for further evaluation. Her husband was present. In the past I took care of her husband's mother who recently passed away.  As part of today's evaluation I have reviewed prior primary care notes. No Known Allergies  Current Outpatient Prescriptions  Medication Sig Dispense Refill  . amoxicillin-clavulanate (AUGMENTIN) 875-125 MG per tablet Take 1 tablet by mouth 2 (two) times daily.  20 tablet  0  . aspirin 81 MG EC tablet Take 81 mg by mouth daily.        Marland Kitchen buPROPion (WELLBUTRIN XL) 150 MG 24 hr tablet Take 150 mg by mouth every morning.  90 tablet  3  . Colesevelam HCl 3.75 G PACK Take 1 each by mouth daily.  90 each  3  . fluticasone (FLONASE) 50 MCG/ACT nasal spray Place 2 sprays into the nose daily.  16 g  6  . glimepiride (AMARYL) 4 MG tablet TAKE 1 TABLET ONCE DAILY  90 tablet  3  . glucose blood (FREESTYLE LITE) test strip Test three times daily as directed  300 each  3  . HYDROcodone-homatropine (HYCODAN) 5-1.5 MG/5ML syrup Take 5 mLs by mouth every 8 (eight) hours as needed for cough.  120 mL  0  . insulin glargine (LANTUS SOLOSTAR) 100 UNIT/ML injection Inject 30 Units into the skin at bedtime.  9 mL  1  . Insulin Pen Needle (QC PEN NEEDLES) 31G X 8 MM MISC Use daily with lantus insulin dx:250.00  100 each  4  . Lancets (FREESTYLE) lancets Test three times daily as directed  300 each  3  . metFORMIN (GLUCOPHAGE) 1000 MG tablet TAKE 1 TABLET TWICE A DAY  180 tablet  0  . niacin (NIASPAN) 500 MG CR tablet Take  1,000 mg by mouth at bedtime.        Marland Kitchen omeprazole (PRILOSEC) 20 MG capsule TAKE 2 CAPSULES DAILY  180 capsule  0  . pioglitazone (ACTOS) 30 MG tablet Take 1 tablet (30 mg total) by mouth daily.  90 tablet  3  . rosuvastatin (CRESTOR) 20 MG tablet Take 20 mg by mouth at bedtime.        . sertraline (ZOLOFT) 50 MG tablet TAKE 1 TABLET ONCE DAILY  90 tablet  0  . valsartan (DIOVAN) 320 MG tablet Take 1 tablet (320 mg total) by mouth daily.  90 tablet  3  . VICTOZA 18 MG/3ML SOLN INJECT 1.8 MG DAILY  9 mL  3    History   Social History  . Marital Status: Married    Spouse Name: Tauri Ethington    Number of Children: N/A  . 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 Smoking status: Never Smoker   . Smokeless tobacco: Never Used  . Alcohol Use: No  . Drug Use: No  . Sexually Active: Not Currently   Other Topics Concern  . Not on file   Social History Narrative   Regular Exercise -  NO    Family History  Problem Relation Age of Onset  . Stroke Maternal Grandfather   . Coronary artery disease Other   . Skin cancer Other   . Heart attack Other   . Hypertension Other   . Hyperlipidemia Other   . Diabetes Other   . Cancer Other     skin    Past Medical History  Diagnosis Date  . Type II or unspecified type diabetes mellitus without mention of complication, not stated as uncontrolled   . Depression   . GERD (gastroesophageal reflux disease)   . Hyperlipidemia   . Nephrolithiasis   . HTN (hypertension)   . Gallstones 11-06  . Chest pain   . LBBB (left bundle branch block)     Past Surgical History  Procedure Date  . Tonsillectomy and adenoidectomy   . Cesarean section     x2 ? w/appy  . Knee surgery 11/2005  . Splenectomy   . Esophagogastroduodenoscopy     Patient Active Problem List  Diagnosis  . Type II or unspecified type diabetes mellitus without mention of complication, uncontrolled  .  HYPERLIPIDEMIA  . LEUKOCYTOSIS  . DEPRESSION  . GERD  . Lumbago  . Chest pain at rest  . Need for prophylactic vaccination and inoculation against influenza  . Nonspecific abnormal electrocardiogram (ECG) (EKG)  . Depression  . GERD (gastroesophageal reflux disease)  . Hyperlipidemia  . Nephrolithiasis  . HTN (hypertension)  . Gallstones  . Chest pain  . LBBB (left bundle branch block)    ROS   Patient denies fever, chills, headache, sweats, rash, change in vision, change in hearing,, nausea vomiting, urinary symptoms. All other systems are reviewed and are negative.  PHYSICAL EXAM  Patient is overweight. She is here with her husband. She is stable. There is no jugulovenous distention. Lungs are clear. Respiratory effort is nonlabored. Cardiac exam reveals S1 and S2. There no clicks or significant murmurs. The abdomen is soft. There is no peripheral edema. There no musculoskeletal deformities. There are no skin rashes.  Filed Vitals:   06/19/12 1425  BP: 146/80  Pulse: 95  Height: 5\' 3"  (1.6 m)  Weight: 186 lb 3.2 oz (84.46 kg)  SpO2: 97%    I reviewed the EKG from June 12, 2012. There is left bundle branch block. There sinus rhythm.  ASSESSMENT & PLAN

## 2012-06-19 NOTE — Assessment & Plan Note (Signed)
Lipids are being treated by her primary team.

## 2012-06-19 NOTE — Assessment & Plan Note (Signed)
Patient has chest pain. She's had this intermittently over time. EKG reveals left bundle branch block. She has significant risk factors for coronary disease. I feel that we should proceed with an exercise test. The best study for her will be an adenosine nuclear scan. This will be done at all be in touch with her with the information. I will also see her back for followup.

## 2012-06-19 NOTE — Assessment & Plan Note (Signed)
Blood pressure is under reasonable control today. No change in therapy.

## 2012-06-19 NOTE — Assessment & Plan Note (Signed)
Patient's diabetes is being treated by her primary team.

## 2012-06-19 NOTE — Assessment & Plan Note (Addendum)
I looked back in the old records. The patient had left bundle branch block in 2010.

## 2012-06-21 ENCOUNTER — Other Ambulatory Visit: Payer: Self-pay | Admitting: Internal Medicine

## 2012-06-23 ENCOUNTER — Ambulatory Visit (HOSPITAL_COMMUNITY): Payer: 59 | Attending: Cardiology | Admitting: Radiology

## 2012-06-23 ENCOUNTER — Encounter: Payer: Self-pay | Admitting: Cardiology

## 2012-06-23 VITALS — BP 156/78 | Ht 63.0 in | Wt 185.0 lb

## 2012-06-23 DIAGNOSIS — R0602 Shortness of breath: Secondary | ICD-10-CM

## 2012-06-23 DIAGNOSIS — R0609 Other forms of dyspnea: Secondary | ICD-10-CM | POA: Insufficient documentation

## 2012-06-23 DIAGNOSIS — R0789 Other chest pain: Secondary | ICD-10-CM | POA: Insufficient documentation

## 2012-06-23 DIAGNOSIS — R002 Palpitations: Secondary | ICD-10-CM | POA: Insufficient documentation

## 2012-06-23 DIAGNOSIS — E119 Type 2 diabetes mellitus without complications: Secondary | ICD-10-CM

## 2012-06-23 DIAGNOSIS — R079 Chest pain, unspecified: Secondary | ICD-10-CM

## 2012-06-23 DIAGNOSIS — R0989 Other specified symptoms and signs involving the circulatory and respiratory systems: Secondary | ICD-10-CM | POA: Insufficient documentation

## 2012-06-23 DIAGNOSIS — R42 Dizziness and giddiness: Secondary | ICD-10-CM | POA: Insufficient documentation

## 2012-06-23 DIAGNOSIS — I10 Essential (primary) hypertension: Secondary | ICD-10-CM | POA: Insufficient documentation

## 2012-06-23 DIAGNOSIS — I447 Left bundle-branch block, unspecified: Secondary | ICD-10-CM | POA: Insufficient documentation

## 2012-06-23 MED ORDER — TECHNETIUM TC 99M SESTAMIBI GENERIC - CARDIOLITE
30.0000 | Freq: Once | INTRAVENOUS | Status: AC | PRN
  Administered 2012-06-23: 30 via INTRAVENOUS

## 2012-06-23 MED ORDER — TECHNETIUM TC 99M SESTAMIBI GENERIC - CARDIOLITE
10.0000 | Freq: Once | INTRAVENOUS | Status: AC | PRN
  Administered 2012-06-23: 10 via INTRAVENOUS

## 2012-06-23 MED ORDER — ADENOSINE (DIAGNOSTIC) 3 MG/ML IV SOLN
0.5600 mg/kg | Freq: Once | INTRAVENOUS | Status: AC
  Administered 2012-06-23: 47.1 mg via INTRAVENOUS

## 2012-06-23 NOTE — Progress Notes (Signed)
  [facility] SITE 3 NUCLEAR MED [address] [place], [place] [address] [phone]    Cardiology Nuclear Med Study  Margaret Norman is a 61 y.o. female     MRN : [id number]     DOB: April 25, 1951  Procedure Date: 06/23/2012  Nuclear Med Background Indication for Stress Test:  Evaluation for Ischemia History:  GXT: long time ago per patient,results normal per patient Cardiac Risk Factors: Hypertension, IDDM Type 2, LBBB, Lipids and Overweight  Symptoms:  Chest Pain/Pressure/Tightnes at rest and  with Exertion (last date of chest discomfort yesterday), Dizziness, DOE, Fatigue, Nausea, Palpitations and SOB   Nuclear Pre-Procedure Caffeine/Decaff Intake:  9:00pm NPO After: 9:00pm   Lungs:  clear O2 Sat: 97% on room air. IV 0.9% NS with Angio Cath:  20g  IV Site: R Hand  IV Started by:  Cathlyn Parsons, RN  Chest Size (in):  42 Cup Size: DD  Height: 5\' 3"  (1.6 m)  Weight:  185 lb (83.915 kg)  BMI:  Body mass index is 32.77 kg/(m^2). Tech Comments:  n/a    Nuclear Med Study 1 or 2 day study: 1 day  Stress Test Type:  Adenosine  Reading MD: Olga Millers, MD  Order Authorizing Provider:  Effie Shy  Resting Radionuclide: Technetium 65m Sestamibi  Resting Radionuclide Dose: 11.0 mCi   Stress Radionuclide:  Technetium 13m Sestamibi  Stress Radionuclide Dose: 33.0 mCi           Stress Protocol Rest HR: 72 Stress HR: 93  Rest BP: 156/78 Stress BP: 142/71  Exercise Time (min): n/a METS: n/a   Predicted Max HR: 158 bpm % Max HR: 58.86 bpm Rate Pressure Product: 29528    Dose of Adenosine (mg):  47.1 Dose of Lexiscan: n/a mg  Dose of Atropine (mg): n/a Dose of Dobutamine: n/a mcg/kg/min (at max HR)  Stress Test Technologist: Cathlyn Parsons, RN  Nuclear Technologist:  Domenic Polite, CNMT     Rest Procedure:  Myocardial perfusion imaging was performed at rest 45 minutes following the intravenous administration of Technetium 32m Sestamibi. Rest ECG:  NSR-LBBB  Stress Procedure:  The patient received IV adenosine at 140 mcg/kg/min for 4-minutes. Patient had chest tightness 8/10 with infusion.Technetium 47m Sestamibi was injected at the 2-minute mark and quantitative spect images were obtained. Stress ECG: Uninteretable due to baseline LBBB  QPS Raw Data Images:  Acquisition technically good; normal left ventricular size. Stress Images:  There is decreased uptake in the septum. Rest Images:  There is decreased uptake in the septum. Subtraction (SDS):  No evidence of ischemia. Transient Ischemic Dilatation (Normal <1.22):  1.07 Lung/Heart Ratio (Normal <0.45):  0.34  Quantitative Gated Spect Images QGS EDV:  108 ml QGS ESV:  50 ml  Impression Exercise Capacity:  Adenosine study with no exercise. BP Response:  Normal blood pressure response. Clinical Symptoms:  There is chest pain. ECG Impression:  Baseline:  LBBB.  EKG uninterpretable due to LBBB at rest and stress. Comparison with Prior Nuclear Study: No images to compare  Overall Impression:  Low risk stress nuclear study with a small, moderate intensity, fixed anteroseptal defect possibly related to LBBB vs small prior infarct; no ischemia.  LV Ejection Fraction: 54%.  LV Wall Motion:  NL LV Function; NL Wall Motion  Olga Millers

## 2012-06-30 ENCOUNTER — Encounter: Payer: Self-pay | Admitting: Cardiology

## 2012-06-30 DIAGNOSIS — R079 Chest pain, unspecified: Secondary | ICD-10-CM | POA: Insufficient documentation

## 2012-06-30 NOTE — Progress Notes (Signed)
    I have reviewed the nuclear scan from June 23, 2012. She has left bundle branch block. The study was done with adenosine. This was a low risk scan with small, moderate intensity, fixed anteroseptal defect. This would possibly be related to LBBB versus small prior infarct. No ischemia  These findings are consistent with the patient's LBBB. We will call to reassure her. Hold and see her back in followup for further assessment.

## 2012-06-30 NOTE — Progress Notes (Signed)
Patient ID: Margaret Norman, female   DOB: [birth year], 62 y.o.   MRN: [id number] Pt was notified and appt was scheduled with Dr Myrtis Ser for f/u.

## 2012-07-04 ENCOUNTER — Ambulatory Visit (INDEPENDENT_AMBULATORY_CARE_PROVIDER_SITE_OTHER): Payer: BC Managed Care – PPO | Admitting: Internal Medicine

## 2012-07-04 ENCOUNTER — Encounter: Payer: Self-pay | Admitting: Internal Medicine

## 2012-07-04 VITALS — BP 118/86 | HR 91 | Temp 97.0°F | Resp 16 | Wt 187.0 lb

## 2012-07-04 DIAGNOSIS — Z1231 Encounter for screening mammogram for malignant neoplasm of breast: Secondary | ICD-10-CM

## 2012-07-04 DIAGNOSIS — I10 Essential (primary) hypertension: Secondary | ICD-10-CM

## 2012-07-04 DIAGNOSIS — I447 Left bundle-branch block, unspecified: Secondary | ICD-10-CM

## 2012-07-04 LAB — HM DIABETES FOOT EXAM: HM Diabetic Foot Exam: NORMAL

## 2012-07-04 NOTE — Assessment & Plan Note (Signed)
She is not willing to start invokana She will try to improve her lifestyle modifications

## 2012-07-04 NOTE — Assessment & Plan Note (Signed)
Her BP is well controlled 

## 2012-07-04 NOTE — Assessment & Plan Note (Signed)
There does not appear to be any CAD or CHF associated with this

## 2012-07-04 NOTE — Progress Notes (Signed)
Subjective:    Patient ID: Margaret Norman, female    DOB: Nov 06, 1950, 62 y.o.   MRN: [id number]  Diabetes She presents for her follow-up diabetic visit. She has type 2 diabetes mellitus. Her disease course has been worsening. There are no hypoglycemic associated symptoms. Pertinent negatives for hypoglycemia include no pallor. Associated symptoms include fatigue, polydipsia, polyphagia and polyuria. Pertinent negatives for diabetes include no blurred vision, no chest pain, no foot paresthesias, no foot ulcerations, no visual change, no weakness and no weight loss. There are no hypoglycemic complications. There are no diabetic complications. Current diabetic treatment includes oral agent (triple therapy) and insulin injections. She is compliant with treatment all of the time. Meal planning includes avoidance of concentrated sweets. She never participates in exercise. There is no change in her home blood glucose trend. An ACE inhibitor/angiotensin II receptor blocker is being taken. She does not see a podiatrist.Eye exam is current.      Review of Systems  Constitutional: Positive for fatigue. Negative for fever, chills, weight loss, diaphoresis, activity change, appetite change and unexpected weight change.  HENT: Negative.   Eyes: Negative.  Negative for blurred vision.  Respiratory: Positive for shortness of breath. Negative for apnea, cough, choking, chest tightness, wheezing and stridor.   Cardiovascular: Negative for chest pain, palpitations and leg swelling.  Gastrointestinal: Negative for nausea, abdominal pain, diarrhea, constipation and blood in stool.  Genitourinary: Positive for polyuria.  Musculoskeletal: Negative for myalgias, back pain, joint swelling, arthralgias and gait problem.  Skin: Negative for color change, pallor, rash and wound.  Neurological: Negative.  Negative for weakness.  Hematological: Positive for polydipsia and polyphagia. Negative for adenopathy. Does not  bruise/bleed easily.  Psychiatric/Behavioral: Negative.        Objective:   Physical Exam  Vitals reviewed. Constitutional: She is oriented to person, place, and time. She appears well-developed and well-nourished. No distress.  HENT:  Head: Normocephalic and atraumatic.  Mouth/Throat: Oropharynx is clear and moist. No oropharyngeal exudate.  Eyes: Conjunctivae normal are normal. Right eye exhibits no discharge. Left eye exhibits no discharge. No scleral icterus.  Neck: Normal range of motion. Neck supple. No JVD present. No tracheal deviation present. No thyromegaly present.  Cardiovascular: Normal rate, regular rhythm, normal heart sounds and intact distal pulses.  Exam reveals no gallop and no friction rub.   No murmur heard. Pulmonary/Chest: Effort normal and breath sounds normal. No stridor. No respiratory distress. She has no wheezes. She has no rales. She exhibits no tenderness.  Abdominal: Soft. Bowel sounds are normal. She exhibits no distension and no mass. There is no tenderness. There is no rebound and no guarding.  Musculoskeletal: Normal range of motion. She exhibits edema (2+ edema in BLE). She exhibits no tenderness.  Lymphadenopathy:    She has no cervical adenopathy.  Neurological: She is oriented to person, place, and time.  Skin: Skin is warm and dry. No rash noted. She is not diaphoretic. No erythema. No pallor.  Psychiatric: She has a normal mood and affect. Her behavior is normal. Judgment and thought content normal.     Lab Results  Component Value Date   WBC 15.6* 06/12/2012   HGB 13.0 06/12/2012   HCT 38.9 06/12/2012   PLT 412.0* 06/12/2012   GLUCOSE 93 06/12/2012   CHOL 141 08/07/2011   TRIG 142.0 08/07/2011   HDL 42.90 08/07/2011   LDLCALC 70 08/07/2011   ALT 19 06/12/2012   AST 16 06/12/2012   NA 141 06/12/2012   K  4.6 06/12/2012   CL 106 06/12/2012   CREATININE 0.6 06/12/2012   BUN 18 06/12/2012   CO2 26 06/12/2012   TSH 1.37 06/12/2012    HGBA1C 10.1* 06/12/2012   MICROALBUR 0.5 11/15/2008       Assessment & Plan:

## 2012-07-13 DIAGNOSIS — R079 Chest pain, unspecified: Secondary | ICD-10-CM | POA: Insufficient documentation

## 2012-07-14 ENCOUNTER — Encounter: Payer: Self-pay | Admitting: Cardiology

## 2012-07-14 ENCOUNTER — Ambulatory Visit (INDEPENDENT_AMBULATORY_CARE_PROVIDER_SITE_OTHER): Payer: BC Managed Care – PPO | Admitting: Cardiology

## 2012-07-14 VITALS — BP 140/82 | HR 82 | Ht 63.0 in | Wt 188.4 lb

## 2012-07-14 DIAGNOSIS — R079 Chest pain, unspecified: Secondary | ICD-10-CM

## 2012-07-14 DIAGNOSIS — I447 Left bundle-branch block, unspecified: Secondary | ICD-10-CM

## 2012-07-14 NOTE — Assessment & Plan Note (Signed)
At this point it appears that there is a mild abnormality on the nuclear scan consistent with left bundle branch block. There is no ischemia. I have explained the results of the patient. I feel that no further workup is needed at this time. I do plan to see her back in 6 months to be sure that she remains stable. If she has any significant change in her symptoms she will be in touch with [place].

## 2012-07-14 NOTE — Assessment & Plan Note (Signed)
Left bundle branch block is old. No change in therapy. 

## 2012-07-14 NOTE — Patient Instructions (Addendum)
Your physician wants you to follow-up in: 6 months  You will receive a reminder letter in the mail two months in advance. If you don't receive a letter, please call our office to schedule the follow-up appointment.  Your physician recommends that you continue on your current medications as directed. Please refer to the Current Medication list given to you today.  

## 2012-07-14 NOTE — Progress Notes (Signed)
HPI  Patient returns to complete the evaluation of her chest discomfort. I saw her in the office on June 19, 2012. She had chest tightness that has occurred over the past year. She had a left bundle branch block. I was eventually able to find an old EKG from 2010 that also showed left bundle branch block. We decided to proceed with an adenosine Myoview scan. This was done on June 23, 2012. There was question of a small fixed anteroseptal defect that could be related to left bundle branch block. There was no ischemia. Ejection fraction was 55%. The patient returns today and she is stable. She's had no major change in her symptoms.  Allergies  Allergen Reactions  . Amoxicillin     Sore tongue    Current Outpatient Prescriptions  Medication Sig Dispense Refill  . aspirin 81 MG EC tablet Take 81 mg by mouth daily.        Marland Kitchen buPROPion (WELLBUTRIN XL) 150 MG 24 hr tablet Take 150 mg by mouth every morning.  90 tablet  3  . Colesevelam HCl 3.75 G PACK Take 1 each by mouth daily.  90 each  3  . fluticasone (FLONASE) 50 MCG/ACT nasal spray Place 2 sprays into the nose daily.  16 g  6  . glimepiride (AMARYL) 4 MG tablet TAKE 1 TABLET ONCE DAILY  90 tablet  3  . glucose blood (FREESTYLE LITE) test strip Test three times daily as directed  300 each  3  . insulin glargine (LANTUS SOLOSTAR) 100 UNIT/ML injection Inject 30 Units into the skin at bedtime.  9 mL  1  . Insulin Pen Needle (QC PEN NEEDLES) 31G X 8 MM MISC Use daily with lantus insulin dx:250.00  100 each  4  . Lancets (FREESTYLE) lancets Test three times daily as directed  300 each  3  . metFORMIN (GLUCOPHAGE) 1000 MG tablet TAKE 1 TABLET TWICE A DAY  180 tablet  3  . niacin (NIASPAN) 500 MG CR tablet Take 1,000 mg by mouth at bedtime.        Marland Kitchen omeprazole (PRILOSEC) 20 MG capsule TAKE 2 CAPSULES DAILY  180 capsule  3  . pioglitazone (ACTOS) 30 MG tablet Take 1 tablet (30 mg total) by mouth daily.  90 tablet  3  . rosuvastatin  (CRESTOR) 20 MG tablet Take 20 mg by mouth at bedtime.        . sertraline (ZOLOFT) 50 MG tablet TAKE 1 TABLET ONCE DAILY  90 tablet  0  . valsartan (DIOVAN) 320 MG tablet Take 1 tablet (320 mg total) by mouth daily.  90 tablet  3  . VICTOZA 18 MG/3ML SOLN INJECT 1.8 MG DAILY  9 mL  3    History   Social History  . Marital Status: Married    Spouse Name: Paiten Boies    Number of Children: N/A  . 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 Smoking status: Never Smoker   . Smokeless tobacco: Never Used  . Alcohol Use: No  . Drug Use: No  . Sexually Active: Not Currently   Other Topics Concern  . Not on file   Social History Narrative   Regular Exercise -  NO    Family History  Problem Relation Age of Onset  . Stroke Maternal Grandfather   . Coronary artery disease Other   . Skin cancer Other   .  Heart attack Other   . Hypertension Other   . Hyperlipidemia Other   . Diabetes Other   . Cancer Other     skin    Past Medical History  Diagnosis Date  . Type II or unspecified type diabetes mellitus without mention of complication, not stated as uncontrolled   . Depression   . GERD (gastroesophageal reflux disease)   . Hyperlipidemia   . Nephrolithiasis   . HTN (hypertension)   . Gallstones 11-06  . Chest pain     Nuclear, adenosine,  December, 2013, low risk nuclear scan with small, moderate in intensity, fixed anteroseptal defect. This is possibly related to an LBBB versus small prior infarct. No ischemia  . LBBB (left bundle branch block)     Past Surgical History  Procedure Date  . Tonsillectomy and adenoidectomy   . Cesarean section     x2 ? w/appy  . Knee surgery 11/2005  . Splenectomy   . Esophagogastroduodenoscopy     Patient Active Problem List  Diagnosis  . Type II or unspecified type diabetes mellitus without mention of complication, uncontrolled  . HYPERLIPIDEMIA  . LEUKOCYTOSIS    . DEPRESSION  . GERD  . Lumbago  . Nephrolithiasis  . HTN (hypertension)  . Gallstones  . LBBB (left bundle branch block)  . Other screening mammogram  . Chest pain    ROS   Patient denies fever, chills, headache, sweats, rash, change in vision, change in hearing, chest pain, cough, nausea vomiting, urinary symptoms. All other systems are reviewed and are negative.  PHYSICAL EXAM  Patient is overweight. She is stable. There is no jugulovenous distention. Lungs are clear. Respiratory effort is nonlabored. Cardiac exam reveals S1 and S2. There no clicks or significant murmurs. The abdomen is soft. There is no peripheral edema.  Filed Vitals:   07/14/12 1507  BP: 140/82  Pulse: 82  Height: 5\' 3"  (1.6 m)  Weight: 188 lb 6.4 oz (85.458 kg)  SpO2: 96%     ASSESSMENT & PLAN

## 2012-09-15 ENCOUNTER — Telehealth: Payer: Self-pay | Admitting: Internal Medicine

## 2012-09-15 NOTE — Telephone Encounter (Signed)
Caller: [place]/Patient; Phone: [phone]; Reason for Call: Patient calls to get in her record that she went to a local urgent care and was diagnosed with Pneumonia by chest x-ray.  Flu-test neg.  Was given Antibiotic and cough medicine with instructions to rest and not return to work for 24 of being fever free.  Patient wanted this information in her file and to ask if she needed a follow up appointment.  RN offered to message office to see if follow up is needed or suggested calling back as she finishes her antibiotics for a follow up to see if all congestion has cleared.  Caller stated she would do that.  She further states she is feeling better.

## 2012-09-17 ENCOUNTER — Encounter: Payer: Self-pay | Admitting: Internal Medicine

## 2012-09-17 ENCOUNTER — Ambulatory Visit (INDEPENDENT_AMBULATORY_CARE_PROVIDER_SITE_OTHER)
Admission: RE | Admit: 2012-09-17 | Discharge: 2012-09-17 | Disposition: A | Discharge status: Home / Self Care | Payer: BC Managed Care – PPO | Source: Ambulatory Visit | Attending: Internal Medicine | Admitting: Internal Medicine

## 2012-09-17 ENCOUNTER — Ambulatory Visit (INDEPENDENT_AMBULATORY_CARE_PROVIDER_SITE_OTHER): Payer: BC Managed Care – PPO | Admitting: Internal Medicine

## 2012-09-17 VITALS — BP 120/88 | HR 91 | Temp 98.3°F | Resp 16 | Wt 182.0 lb

## 2012-09-17 DIAGNOSIS — J189 Pneumonia, unspecified organism: Secondary | ICD-10-CM | POA: Insufficient documentation

## 2012-09-17 DIAGNOSIS — R05 Cough: Secondary | ICD-10-CM

## 2012-09-17 MED ORDER — AZITHROMYCIN 500 MG PO TABS: 500.0000 mg | ORAL_TABLET | Freq: Every day | ORAL | Status: DC

## 2012-09-17 NOTE — Assessment & Plan Note (Signed)
CXR confirms Bilateral PNA

## 2012-09-17 NOTE — Assessment & Plan Note (Signed)
CXR appearance is c/w an atypical organism causing PNA She will stay on levaquin I will add Zpak for additional coverage of atypicals

## 2012-09-17 NOTE — Progress Notes (Signed)
Subjective:    Patient ID: Margaret Norman, female    DOB: [birth year], 62 y.o.   MRN: [id number]  Cough This is a recurrent problem. The current episode started in the past 7 days. The problem has been gradually improving. The problem occurs every few hours. The cough is productive of brown sputum. Associated symptoms include chills, a fever, a sore throat, shortness of breath and sweats. Pertinent negatives include no chest pain, ear congestion, ear pain, headaches, heartburn, hemoptysis, myalgias, nasal congestion, postnasal drip, rash, rhinorrhea, weight loss or wheezing. Nothing aggravates the symptoms. Treatments tried: levaquin and cough suppressant. The treatment provided moderate relief. Her past medical history is significant for pneumonia. There is no history of asthma, bronchiectasis, bronchitis, COPD, emphysema or environmental allergies. she was seen in [place] 4 days ago and was told she had PNA      Review of Systems  Constitutional: Positive for fever, chills and fatigue. Negative for weight loss, diaphoresis, activity change, appetite change and unexpected weight change.  HENT: Positive for sore throat. Negative for ear pain, facial swelling, rhinorrhea, trouble swallowing, voice change, postnasal drip and sinus pressure.   Eyes: Negative.   Respiratory: Positive for cough and shortness of breath. Negative for apnea, hemoptysis, choking, chest tightness, wheezing and stridor.   Cardiovascular: Negative.  Negative for chest pain, palpitations and leg swelling.  Gastrointestinal: Negative.  Negative for heartburn, nausea, vomiting, abdominal pain, diarrhea, constipation and blood in stool.  Endocrine: Negative.   Genitourinary: Negative.   Musculoskeletal: Negative.  Negative for myalgias.  Skin: Negative.  Negative for rash.  Allergic/Immunologic: Negative.  Negative for environmental allergies.  Neurological: Negative for dizziness, tremors, weakness, light-headedness and  headaches.  Hematological: Negative for adenopathy. Does not bruise/bleed easily.  Psychiatric/Behavioral: Negative.        Objective:   Physical Exam  Vitals reviewed. Constitutional: She is oriented to person, place, and time. She appears well-developed and well-nourished.  Non-toxic appearance. She does not have a sickly appearance. She does not appear ill. No distress.  HENT:  Head: Normocephalic and atraumatic.  Mouth/Throat: Oropharynx is clear and moist. No oropharyngeal exudate.  Eyes: Conjunctivae are normal. Right eye exhibits no discharge. Left eye exhibits no discharge. No scleral icterus.  Neck: Normal range of motion. Neck supple. No JVD present. No tracheal deviation present. No thyromegaly present.  Cardiovascular: Normal rate, regular rhythm, normal heart sounds and intact distal pulses.  Exam reveals no gallop and no friction rub.   No murmur heard. Pulmonary/Chest: Effort normal. No accessory muscle usage or stridor. Not tachypneic. No respiratory distress. She has no decreased breath sounds. She has no wheezes. She has rhonchi in the right middle field, the right lower field, the left middle field and the left lower field. She has no rales.  Abdominal: Soft. Bowel sounds are normal. She exhibits no distension and no mass. There is no tenderness. There is no rebound and no guarding.  Musculoskeletal: Normal range of motion. She exhibits no edema and no tenderness.  Lymphadenopathy:    She has no cervical adenopathy.  Neurological: She is oriented to person, place, and time.  Skin: Skin is warm and dry. No rash noted. She is not diaphoretic. No erythema. No pallor.  Psychiatric: She has a normal mood and affect. Her behavior is normal. Judgment and thought content normal.      Lab Results  Component Value Date   WBC 15.6* 06/12/2012   HGB 13.0 06/12/2012   HCT 38.9 06/12/2012   PLT  412.0* 06/12/2012   GLUCOSE 93 06/12/2012   CHOL 141 08/07/2011   TRIG 142.0  08/07/2011   HDL 42.90 08/07/2011   LDLCALC 70 08/07/2011   ALT 19 06/12/2012   AST 16 06/12/2012   NA 141 06/12/2012   K 4.6 06/12/2012   CL 106 06/12/2012   CREATININE 0.6 06/12/2012   BUN 18 06/12/2012   CO2 26 06/12/2012   TSH 1.37 06/12/2012   HGBA1C 10.1* 06/12/2012   MICROALBUR 0.5 11/15/2008      Assessment & Plan:

## 2012-09-17 NOTE — Patient Instructions (Signed)

## 2012-09-22 ENCOUNTER — Encounter: Payer: Self-pay | Admitting: Internal Medicine

## 2012-09-22 ENCOUNTER — Telehealth: Payer: Self-pay | Admitting: Internal Medicine

## 2012-09-22 ENCOUNTER — Ambulatory Visit (INDEPENDENT_AMBULATORY_CARE_PROVIDER_SITE_OTHER): Payer: BC Managed Care – PPO | Admitting: Internal Medicine

## 2012-09-22 ENCOUNTER — Other Ambulatory Visit (INDEPENDENT_AMBULATORY_CARE_PROVIDER_SITE_OTHER): Payer: BC Managed Care – PPO

## 2012-09-22 ENCOUNTER — Ambulatory Visit (INDEPENDENT_AMBULATORY_CARE_PROVIDER_SITE_OTHER)
Admission: RE | Admit: 2012-09-22 | Discharge: 2012-09-22 | Disposition: A | Discharge status: Home / Self Care | Payer: BC Managed Care – PPO | Source: Ambulatory Visit | Attending: Internal Medicine | Admitting: Internal Medicine

## 2012-09-22 VITALS — BP 130/94 | HR 102 | Temp 98.6°F | Resp 16

## 2012-09-22 DIAGNOSIS — I10 Essential (primary) hypertension: Secondary | ICD-10-CM

## 2012-09-22 DIAGNOSIS — R05 Cough: Secondary | ICD-10-CM

## 2012-09-22 DIAGNOSIS — J189 Pneumonia, unspecified organism: Secondary | ICD-10-CM

## 2012-09-22 DIAGNOSIS — J45901 Unspecified asthma with (acute) exacerbation: Secondary | ICD-10-CM | POA: Insufficient documentation

## 2012-09-22 LAB — BASIC METABOLIC PANEL
Calcium: 10 mg/dL (ref 8.4–10.5)
Creatinine, Ser: 0.8 mg/dL (ref 0.4–1.2)
GFR: 80.65 mL/min (ref >60.00)
Glucose, Bld: 350 mg/dL — ABNORMAL HIGH (ref 70–99)
Sodium: 138 mEq/L (ref 135–145)

## 2012-09-22 MED ORDER — MOMETASONE FURO-FORMOTEROL FUM 200-5 MCG/ACT IN AERO: 2.0000 | INHALATION_SPRAY | Freq: Two times a day (BID) | RESPIRATORY_TRACT | Status: DC

## 2012-09-22 MED ORDER — HYDROCOD POLST-CPM POLST ER 10-8 MG PO CP12: 1.0000 | ORAL_CAPSULE | Freq: Two times a day (BID) | ORAL | Status: DC | PRN

## 2012-09-22 MED ORDER — METHYLPREDNISOLONE ACETATE 80 MG/ML IJ SUSP
80.0000 mg | Freq: Once | INTRAMUSCULAR | Status: AC
  Administered 2012-09-22: 80 mg via INTRAMUSCULAR

## 2012-09-22 MED ORDER — METHYLPREDNISOLONE ACETATE 40 MG/ML IJ SUSP
40.0000 mg | Freq: Once | INTRAMUSCULAR | Status: AC
  Administered 2012-09-22: 40 mg via INTRAMUSCULAR

## 2012-09-22 NOTE — Assessment & Plan Note (Signed)
cxr done Will treat the asthma

## 2012-09-22 NOTE — Telephone Encounter (Signed)
Patient Information:  Caller Name: Konica Stankowski  Phone: [phone]  Patient: ,   Gender: Female  DOB: [birth year]  Age: 62 Years  PCP: Sanda Linger (Adults only)  Office Follow Up:  Does the office need to follow up with this patient?: No  Instructions For The Office: N/A   Symptoms  Reason For Call & Symptoms: Started on  2cd antibiotic for Bilateral Pneumonia on 09/17/12. Seen in [facility] on 09/14/12 and then by Dr. Yetta Barre on 09/19/12. Was taking  Levoquin and started on Zpac on 09/19/12. Chest still  feels tight and she wheezing when lays down.Drinking fluids. Appetite diminished and she has not been taking Oral DM meds(Metformin, Actos, Glipizide).  BG= 420 this morning at 0600 before Insulin L[place] 30 U. Blood glucose recheck =402 at 0945-09/22/12.  Just took Actose and Metformin per RN instruction and appointment scheduled for her to come in now- 1015-09/22/12.  Reviewed Health History In EMR: Yes  Reviewed Medications In EMR: Yes  Reviewed Allergies In EMR: Yes  Reviewed Surgeries / Procedures: Yes  Date of Onset of Symptoms: 09/12/2012  Treatments Tried: Rx Cough Med  Treatments Tried Worked: No  Guideline(s) Used:  Cough  Diabetes - High Blood Sugar  Disposition Per Guideline:   Discuss with PCP and Callback by Nurse within 1 Hour- Spoke with RN in office and told to come in now.  Reason For Disposition Reached:   Blood glucose > 400 mg/dl (22 mmol/l)  Advice Given:  Reassurance  Coughing is the way that our lungs remove irritants and mucus. It helps protect our lungs from getting pneumonia.  You can get a dry hacking cough after a chest cold. Sometimes this type of cough can last 1-3 weeks, and be worse at night.  Prevent Dehydration:  This will help soothe an irritated or dry throat and loosen up the phlegm.  Call Back If:  Difficulty breathing  Fever lasts > 3 days  You become worse.  Patient Will Follow Care Advice:  YES

## 2012-09-22 NOTE — Assessment & Plan Note (Signed)
Xray shows resolution S/s today are related to asthma

## 2012-09-22 NOTE — Patient Instructions (Signed)

## 2012-09-22 NOTE — Assessment & Plan Note (Signed)
Her blood sugar is high because she has not taken her meds for about 2 weeks She agrees to restart her meds today

## 2012-09-22 NOTE — Assessment & Plan Note (Signed)
She is having a flare of asthma so I gave her an injection of depo-medrol IM She will start dulera as well - I gave her samples and I showed her how to use the inhaler, she demonstrated proficiency

## 2012-09-22 NOTE — Progress Notes (Signed)
  Subjective:    Patient ID: Margaret Norman, female    DOB: 1951/04/26, 62 y.o.   MRN: [id number]  Cough This is a recurrent problem. The current episode started 1 to 4 weeks ago. The problem has been unchanged. The cough is non-productive. Associated symptoms include shortness of breath and wheezing. Pertinent negatives include no chest pain, chills, ear congestion, ear pain, fever, headaches, heartburn, hemoptysis, myalgias, nasal congestion, postnasal drip, rash, rhinorrhea or sore throat. The symptoms are aggravated by cold air. She has tried prescription cough suppressant for the symptoms. The treatment provided mild relief. Her past medical history is significant for bronchitis and pneumonia. There is no history of asthma, bronchiectasis, COPD, emphysema or environmental allergies.      Review of Systems  Constitutional: Positive for fatigue. Negative for fever, chills, diaphoresis, activity change, appetite change and unexpected weight change.  HENT: Negative.  Negative for ear pain, sore throat, rhinorrhea, trouble swallowing, voice change and postnasal drip.   Eyes: Negative.   Respiratory: Positive for cough, shortness of breath and wheezing. Negative for apnea, hemoptysis, choking, chest tightness and stridor.   Cardiovascular: Negative.  Negative for chest pain, palpitations and leg swelling.  Gastrointestinal: Negative.  Negative for heartburn, nausea, vomiting, abdominal pain, diarrhea and constipation.  Endocrine: Positive for polydipsia, polyphagia and polyuria. Negative for cold intolerance and heat intolerance.  Musculoskeletal: Negative.  Negative for myalgias, back pain, joint swelling and gait problem.  Skin: Negative.  Negative for color change, pallor, rash and wound.  Allergic/Immunologic: Negative.  Negative for environmental allergies.  Neurological: Negative.  Negative for dizziness, weakness, light-headedness and headaches.  Hematological: Negative.  Negative for  adenopathy. Does not bruise/bleed easily.  Psychiatric/Behavioral: Negative.        Objective:   Physical Exam  Vitals reviewed. Constitutional: She is oriented to person, place, and time. She appears well-developed and well-nourished.  Non-toxic appearance. She does not have a sickly appearance. She does not appear ill. No distress.  HENT:  Head: Normocephalic and atraumatic.  Mouth/Throat: Oropharynx is clear and moist. No oropharyngeal exudate.  Eyes: Conjunctivae are normal. Right eye exhibits no discharge. Left eye exhibits no discharge. No scleral icterus.  Neck: Normal range of motion. Neck supple. No JVD present. No tracheal deviation present. No thyromegaly present.  Cardiovascular: Normal rate, regular rhythm, normal heart sounds and intact distal pulses.  Exam reveals no gallop and no friction rub.   No murmur heard. Pulmonary/Chest: Effort normal. No accessory muscle usage or stridor. Not tachypneic. No respiratory distress. She has no decreased breath sounds. She has wheezes in the right lower field and the left lower field. She has rhonchi in the right middle field and the left middle field. She has no rales. She exhibits no tenderness.  Abdominal: Soft. Bowel sounds are normal. She exhibits no distension and no mass. There is no tenderness. There is no rebound and no guarding.  Musculoskeletal: Normal range of motion. She exhibits no edema and no tenderness.  Lymphadenopathy:    She has no cervical adenopathy.  Neurological: She is oriented to person, place, and time.  Skin: Skin is warm and dry. No rash noted. She is not diaphoretic. No erythema. No pallor.  Psychiatric: She has a normal mood and affect. Her behavior is normal. Judgment and thought content normal.          Assessment & Plan:

## 2012-09-23 ENCOUNTER — Ambulatory Visit: Payer: BC Managed Care – PPO | Admitting: Internal Medicine

## 2012-09-25 DIAGNOSIS — Z0279 Encounter for issue of other medical certificate: Secondary | ICD-10-CM

## 2012-11-03 ENCOUNTER — Ambulatory Visit (INDEPENDENT_AMBULATORY_CARE_PROVIDER_SITE_OTHER): Payer: BC Managed Care – PPO | Admitting: Internal Medicine

## 2012-11-03 ENCOUNTER — Encounter: Payer: Self-pay | Admitting: Internal Medicine

## 2012-11-03 VITALS — BP 132/86 | HR 77 | Temp 97.9°F | Resp 12 | Wt 178.4 lb

## 2012-11-03 DIAGNOSIS — IMO0001 Reserved for inherently not codable concepts without codable children: Secondary | ICD-10-CM

## 2012-11-03 DIAGNOSIS — I10 Essential (primary) hypertension: Secondary | ICD-10-CM

## 2012-11-03 MED ORDER — CANAGLIFLOZIN 300 MG PO TABS: 1.0000 | ORAL_TABLET | Freq: Every day | ORAL | Status: DC

## 2012-11-03 NOTE — Assessment & Plan Note (Signed)
I have asked her to stop the SU, I think invokana would be a better agent to help her lose weight She will continue th other agents

## 2012-11-03 NOTE — Patient Instructions (Signed)

## 2012-11-03 NOTE — Assessment & Plan Note (Signed)
Her BP is well controlled 

## 2012-11-03 NOTE — Progress Notes (Signed)
Subjective:    Patient ID: Margaret Norman, female    DOB: 1950/12/14, 62 y.o.   MRN: [id number]  Diabetes She presents for her follow-up diabetic visit. She has type 2 diabetes mellitus. Her disease course has been worsening. There are no hypoglycemic associated symptoms. Pertinent negatives for hypoglycemia include no dizziness. Associated symptoms include polydipsia, polyphagia and polyuria. Pertinent negatives for diabetes include no blurred vision, no chest pain, no fatigue, no foot paresthesias, no foot ulcerations, no visual change, no weakness and no weight loss. There are no hypoglycemic complications. Symptoms are stable. There are no diabetic complications. Current diabetic treatment includes oral agent (triple therapy) and insulin injections. She is compliant with treatment all of the time. Her weight is stable. She is following a generally healthy diet. Meal planning includes avoidance of concentrated sweets. She participates in exercise intermittently. There is no change in her home blood glucose trend. Her breakfast blood glucose range is generally 140-180 mg/dl. Her lunch blood glucose range is generally 180-200 mg/dl. Her dinner blood glucose range is generally 140-180 mg/dl. Her highest blood glucose is 180-200 mg/dl. Her overall blood glucose range is 180-200 mg/dl. An ACE inhibitor/angiotensin II receptor blocker is being taken. She does not see a podiatrist.Eye exam is current.      Review of Systems  Constitutional: Negative.  Negative for weight loss and fatigue.  HENT: Negative.   Eyes: Negative.  Negative for blurred vision.  Respiratory: Negative.   Cardiovascular: Negative.  Negative for chest pain.  Gastrointestinal: Negative.   Endocrine: Positive for polydipsia, polyphagia and polyuria.  Musculoskeletal: Negative.   Skin: Negative.   Allergic/Immunologic: Negative.   Neurological: Negative for dizziness and weakness.  Hematological: Negative.    Psychiatric/Behavioral: Negative.        Objective:   Physical Exam  Vitals reviewed. Constitutional: She is oriented to person, place, and time. She appears well-developed and well-nourished. No distress.  HENT:  Head: Normocephalic and atraumatic.  Mouth/Throat: No oropharyngeal exudate.  Eyes: Conjunctivae are normal. Right eye exhibits no discharge. Left eye exhibits no discharge. No scleral icterus.  Neck: Normal range of motion. Neck supple. No JVD present. No tracheal deviation present. No thyromegaly present.  Cardiovascular: Normal rate, regular rhythm, normal heart sounds and intact distal pulses.  Exam reveals no gallop and no friction rub.   No murmur heard. Pulmonary/Chest: Effort normal and breath sounds normal. No stridor. No respiratory distress. She has no wheezes. She has no rales. She exhibits no tenderness.  Abdominal: Soft. Bowel sounds are normal. She exhibits no distension and no mass. There is no tenderness. There is no rebound and no guarding.  Musculoskeletal: Normal range of motion. She exhibits no edema and no tenderness.  Lymphadenopathy:    She has no cervical adenopathy.  Neurological: She is oriented to person, place, and time.  Skin: Skin is warm and dry. No rash noted. She is not diaphoretic. No erythema. No pallor.  Psychiatric: She has a normal mood and affect. Her behavior is normal. Judgment and thought content normal.      Lab Results  Component Value Date   WBC 15.6* 06/12/2012   HGB 13.0 06/12/2012   HCT 38.9 06/12/2012   PLT 412.0* 06/12/2012   GLUCOSE 350* 09/22/2012   CHOL 141 08/07/2011   TRIG 142.0 08/07/2011   HDL 42.90 08/07/2011   LDLCALC 70 08/07/2011   ALT 19 06/12/2012   AST 16 06/12/2012   NA 138 09/22/2012   K 4.4 09/22/2012   CL  99 09/22/2012   CREATININE 0.8 09/22/2012   BUN 16 09/22/2012   CO2 25 09/22/2012   TSH 1.37 06/12/2012   HGBA1C 10.8* 09/22/2012   MICROALBUR 0.5 11/15/2008      Assessment & Plan:

## 2012-12-10 ENCOUNTER — Telehealth: Payer: Self-pay

## 2012-12-10 NOTE — Telephone Encounter (Signed)
Received a fowarded e-mail through [employer] contact form  From patient stating that Victoza is no longer available through [facility] and will need to select a generic alternative. I called [email or web address] asking that she calls back, need to know names of alternatives.

## 2012-12-22 ENCOUNTER — Encounter: Payer: Self-pay | Admitting: Internal Medicine

## 2012-12-23 ENCOUNTER — Other Ambulatory Visit: Payer: Self-pay | Admitting: Internal Medicine

## 2012-12-23 MED ORDER — EXENATIDE ER 2 MG ~~LOC~~ SUSR: 2.0000 mg | SUBCUTANEOUS | Status: DC

## 2013-02-04 ENCOUNTER — Ambulatory Visit (INDEPENDENT_AMBULATORY_CARE_PROVIDER_SITE_OTHER): Payer: BC Managed Care – PPO | Admitting: Internal Medicine

## 2013-02-04 ENCOUNTER — Encounter: Payer: Self-pay | Admitting: Internal Medicine

## 2013-02-04 ENCOUNTER — Ambulatory Visit (INDEPENDENT_AMBULATORY_CARE_PROVIDER_SITE_OTHER): Payer: BC Managed Care – PPO

## 2013-02-04 VITALS — BP 132/88 | HR 85 | Temp 98.0°F | Resp 16 | Wt 172.0 lb

## 2013-02-04 DIAGNOSIS — D72829 Elevated white blood cell count, unspecified: Secondary | ICD-10-CM

## 2013-02-04 DIAGNOSIS — M5416 Radiculopathy, lumbar region: Secondary | ICD-10-CM

## 2013-02-04 DIAGNOSIS — IMO0001 Reserved for inherently not codable concepts without codable children: Secondary | ICD-10-CM

## 2013-02-04 DIAGNOSIS — I10 Essential (primary) hypertension: Secondary | ICD-10-CM

## 2013-02-04 DIAGNOSIS — E119 Type 2 diabetes mellitus without complications: Secondary | ICD-10-CM

## 2013-02-04 DIAGNOSIS — E669 Obesity, unspecified: Secondary | ICD-10-CM

## 2013-02-04 DIAGNOSIS — IMO0002 Reserved for concepts with insufficient information to code with codable children: Secondary | ICD-10-CM

## 2013-02-04 DIAGNOSIS — G4700 Insomnia, unspecified: Secondary | ICD-10-CM

## 2013-02-04 DIAGNOSIS — E785 Hyperlipidemia, unspecified: Secondary | ICD-10-CM

## 2013-02-04 LAB — LIPID PANEL
Cholesterol: 204 mg/dL — ABNORMAL HIGH (ref 0–200)
Total CHOL/HDL Ratio: 5
VLDL: 29 mg/dL (ref 0.0–40.0)

## 2013-02-04 LAB — COMPREHENSIVE METABOLIC PANEL
ALT: 22 U/L (ref 0–35)
CO2: 25 mEq/L (ref 19–32)
Calcium: 9.8 mg/dL (ref 8.4–10.5)
Chloride: 108 mEq/L (ref 96–112)
Creatinine, Ser: 0.7 mg/dL (ref 0.4–1.2)
GFR: 88.45 mL/min (ref >60.00)
Glucose, Bld: 152 mg/dL — ABNORMAL HIGH (ref 70–99)
Sodium: 142 mEq/L (ref 135–145)
Total Protein: 7.9 g/dL (ref 6.0–8.3)

## 2013-02-04 LAB — CBC WITH DIFFERENTIAL/PLATELET
Basophils Absolute: 0.2 10*3/uL — ABNORMAL HIGH (ref 0.0–0.1)
Eosinophils Relative: 1.3 % (ref 0.0–5.0)
HCT: 42.7 % (ref 36.0–46.0)
Hemoglobin: 13.8 g/dL (ref 12.0–15.0)
Lymphocytes Relative: 32.6 % (ref 12.0–46.0)
Lymphs Abs: 5.5 10*3/uL — ABNORMAL HIGH (ref 0.7–4.0)
Monocytes Relative: 2.1 % — ABNORMAL LOW (ref 3.0–12.0)
Neutro Abs: 10.7 10*3/uL — ABNORMAL HIGH (ref 1.4–7.7)
RDW: 13.9 % (ref 11.5–14.6)
WBC: 16.9 10*3/uL — ABNORMAL HIGH (ref 4.5–10.5)

## 2013-02-04 MED ORDER — DOXEPIN HCL 6 MG PO TABS: 1.0000 | ORAL_TABLET | Freq: Every evening | ORAL | Status: DC | PRN

## 2013-02-04 MED ORDER — OLMESARTAN MEDOXOMIL 40 MG PO TABS: 40.0000 mg | ORAL_TABLET | Freq: Every day | ORAL | Status: DC

## 2013-02-04 MED ORDER — INSULIN GLARGINE 100 UNIT/ML ~~LOC~~ SOLN: 30.0000 [IU] | Freq: Every day | SUBCUTANEOUS | Status: DC

## 2013-02-04 MED ORDER — HYDROCODONE-ACETAMINOPHEN 10-325 MG PO TABS: 1.0000 | ORAL_TABLET | Freq: Three times a day (TID) | ORAL | Status: DC | PRN

## 2013-02-04 NOTE — Assessment & Plan Note (Signed)
Her BP is up a little so I have asked her to start Benicar

## 2013-02-04 NOTE — Assessment & Plan Note (Signed)
I have referred her for an eye exam She will start Benicar for renal protection I will check her A1C today and will advise further if needed

## 2013-02-04 NOTE — Patient Instructions (Addendum)
Back Pain, Adult  Low back pain is very common. About 1 in 5 people have back pain. The cause of low back pain is rarely dangerous. The pain often gets better over time. About half of people with a sudden onset of back pain feel better in just 2 weeks. About 8 in 10 people feel better by 6 weeks.   CAUSES  Some common causes of back pain include:  · Strain of the muscles or ligaments supporting the spine.  · Wear and tear (degeneration) of the spinal discs.  · Arthritis.  · Direct injury to the back.  DIAGNOSIS  Most of the time, the direct cause of low back pain is not known. However, back pain can be treated effectively even when the exact cause of the pain is unknown. Answering your caregiver's questions about your overall health and symptoms is one of the most accurate ways to make sure the cause of your pain is not dangerous. If your caregiver needs more information, he or she may order lab work or imaging tests (X-rays or MRIs). However, even if imaging tests show changes in your back, this usually does not require surgery.  HOME CARE INSTRUCTIONS  For many people, back pain returns. Since low back pain is rarely dangerous, it is often a condition that people can learn to manage on their own.   · Remain active. It is stressful on the back to sit or stand in one place. Do not sit, drive, or stand in one place for more than 30 minutes at a time. Take short walks on level surfaces as soon as pain allows. Try to increase the length of time you walk each day.  · Do not stay in bed. Resting more than 1 or 2 days can delay your recovery.  · Do not avoid exercise or work. Your body is made to move. It is not dangerous to be active, even though your back may hurt. Your back will likely heal faster if you return to being active before your pain is gone.  · Pay attention to your body when you  bend and lift. Many people have less discomfort when lifting if they bend their knees, keep the load close to their bodies, and  avoid twisting. Often, the most comfortable positions are those that put less stress on your recovering back.  · Find a comfortable position to sleep. Use a firm mattress and lie on your side with your knees slightly bent. If you lie on your back, put a pillow under your knees.  · Only take over-the-counter or prescription medicines as directed by your caregiver. Over-the-counter medicines to reduce pain and inflammation are often the most helpful. Your caregiver may prescribe muscle relaxant drugs. These medicines help dull your pain so you can more quickly return to your normal activities and healthy exercise.  · Put ice on the injured area.  · Put ice in a plastic bag.  · Place a towel between your skin and the bag.  · Leave the ice on for 15-20 minutes, 3-4 times a day for the first 2 to 3 days. After that, ice and heat may be alternated to reduce pain and spasms.  · Ask your caregiver about trying back exercises and gentle massage. This may be of some benefit.  · Avoid feeling anxious or stressed. Stress increases muscle tension and can worsen back pain. It is important to recognize when you are anxious or stressed and learn ways to manage it. Exercise is a great option.  SEEK MEDICAL CARE IF:  · You have pain that is not relieved with rest or   medicine.  · You have pain that does not improve in 1 week.  · You have new symptoms.  · You are generally not feeling well.  SEEK IMMEDIATE MEDICAL CARE IF:   · You have pain that radiates from your back into your legs.  · You develop new bowel or bladder control problems.  · You have unusual weakness or numbness in your arms or legs.  · You develop nausea or vomiting.  · You develop abdominal pain.  · You feel faint.  Document Released: 06/11/2005 Document Revised: 12/11/2011 Document Reviewed: 10/30/2010  [employer]® Patient Information ©2014 [employer].

## 2013-02-04 NOTE — Assessment & Plan Note (Signed)
She will try norco for the pain I think she needs to have an updated MRI done to see if she has developed nerve impingement or spinal stenosis Will consider referral to pain management

## 2013-02-04 NOTE — Progress Notes (Signed)
Subjective:    Patient ID: Margaret Norman, female    DOB: 1950/12/17, 62 y.o.   MRN: [id number]  Back Pain This is a chronic problem. The current episode started more than 1 year ago. The problem occurs constantly. The problem has been gradually worsening since onset. The pain is present in the lumbar spine. The quality of the pain is described as aching and stabbing. The pain radiates to the right thigh. The pain is at a severity of 4/10. The pain is moderate. The pain is worse during the day. The symptoms are aggravated by standing and position. Associated symptoms include leg pain and numbness (right thigh and leg). Pertinent negatives include no abdominal pain, bladder incontinence, bowel incontinence, chest pain, dysuria, fever, headaches, paresis, paresthesias, pelvic pain, perianal numbness, tingling, weakness or weight loss. Risk factors include obesity and lack of exercise. She has tried NSAIDs for the symptoms. The treatment provided mild relief.      Review of Systems  Constitutional: Negative for fever, chills, weight loss, diaphoresis, appetite change and fatigue.  HENT: Negative.   Eyes: Negative.   Respiratory: Negative.  Negative for cough, choking, chest tightness, shortness of breath, wheezing and stridor.   Cardiovascular: Negative.  Negative for chest pain, palpitations and leg swelling.  Gastrointestinal: Negative.  Negative for nausea, vomiting, abdominal pain, diarrhea, constipation, blood in stool and bowel incontinence.  Endocrine: Negative.   Genitourinary: Negative.  Negative for bladder incontinence, dysuria and pelvic pain.  Musculoskeletal: Positive for back pain. Negative for myalgias, joint swelling, arthralgias and gait problem.  Skin: Negative.  Negative for color change, pallor, rash and wound.  Allergic/Immunologic: Negative.   Neurological: Positive for numbness (right thigh and leg). Negative for dizziness, tingling, tremors, seizures, syncope, facial  asymmetry, speech difficulty, weakness, light-headedness, headaches and paresthesias.  Hematological: Negative.  Negative for adenopathy. Does not bruise/bleed easily.  Psychiatric/Behavioral: Positive for sleep disturbance (FA's and EMA's). Negative for suicidal ideas, hallucinations, behavioral problems, confusion, self-injury, dysphoric mood, decreased concentration and agitation. The patient is not nervous/anxious and is not hyperactive.        Objective:   Physical Exam  Vitals reviewed. Constitutional: She is oriented to person, place, and time. She appears well-developed and well-nourished. No distress.  HENT:  Head: Normocephalic and atraumatic.  Mouth/Throat: Oropharynx is clear and moist. No oropharyngeal exudate.  Eyes: Conjunctivae are normal. Right eye exhibits no discharge. Left eye exhibits no discharge. No scleral icterus.  Neck: Normal range of motion. Neck supple. No JVD present. No tracheal deviation present. No thyromegaly present.  Cardiovascular: Normal rate, regular rhythm, normal heart sounds and intact distal pulses.  Exam reveals no gallop and no friction rub.   No murmur heard. Pulmonary/Chest: Effort normal and breath sounds normal. No stridor. No respiratory distress. She has no wheezes. She has no rales. She exhibits no tenderness.  Abdominal: Soft. Bowel sounds are normal. She exhibits no distension and no mass. There is no tenderness. There is no rebound and no guarding.  Musculoskeletal: Normal range of motion. She exhibits no edema and no tenderness.       Lumbar back: Normal. She exhibits normal range of motion, no tenderness, no bony tenderness, no swelling, no edema, no deformity, no laceration, no pain, no spasm and normal pulse.  Lymphadenopathy:    She has no cervical adenopathy.  Neurological: She is alert and oriented to person, place, and time. She has normal strength. She displays no atrophy, no tremor and normal reflexes. No cranial nerve deficit  or  sensory deficit. She exhibits normal muscle tone. She displays a negative Romberg sign. She displays no seizure activity. Coordination and gait normal. She displays no Babinski's sign on the right side. She displays no Babinski's sign on the left side.  Reflex Scores:      Tricep reflexes are 0 on the right side and 0 on the left side.      Bicep reflexes are 0 on the right side and 0 on the left side.      Brachioradialis reflexes are 0 on the right side and 0 on the left side.      Patellar reflexes are 0 on the right side and 0 on the left side.      Achilles reflexes are 0 on the right side and 0 on the left side. Neg SLR in BLE  Skin: Skin is warm and dry. No rash noted. She is not diaphoretic. No erythema. No pallor.  Psychiatric: She has a normal mood and affect. Her behavior is normal. Judgment and thought content normal.     Lab Results  Component Value Date   WBC 15.6* 06/12/2012   HGB 13.0 06/12/2012   HCT 38.9 06/12/2012   PLT 412.0* 06/12/2012   GLUCOSE 350* 09/22/2012   CHOL 141 08/07/2011   TRIG 142.0 08/07/2011   HDL 42.90 08/07/2011   LDLCALC 70 08/07/2011   ALT 19 06/12/2012   AST 16 06/12/2012   NA 138 09/22/2012   K 4.4 09/22/2012   CL 99 09/22/2012   CREATININE 0.8 09/22/2012   BUN 16 09/22/2012   CO2 25 09/22/2012   TSH 1.37 06/12/2012   HGBA1C 10.8* 09/22/2012   MICROALBUR 0.5 11/15/2008       Assessment & Plan:

## 2013-02-04 NOTE — Assessment & Plan Note (Signed)
She agrees to work on her lifestyle modifications 

## 2013-02-04 NOTE — Assessment & Plan Note (Signed)
She will try silenor 

## 2013-02-05 ENCOUNTER — Encounter: Payer: Self-pay | Admitting: Internal Medicine

## 2013-02-05 LAB — LDL CHOLESTEROL, DIRECT: Direct LDL: 137.9 mg/dL

## 2013-02-05 MED ORDER — COLESEVELAM HCL 3.75 G PO PACK: 1.0000 | PACK | Freq: Every day | ORAL | Status: DC

## 2013-02-05 NOTE — Addendum Note (Signed)
Addended by: Etta Grandchild on: 02/05/2013 12:20 PM   Modules accepted: Orders

## 2013-02-06 ENCOUNTER — Ambulatory Visit
Admission: RE | Admit: 2013-02-06 | Discharge: 2013-02-06 | Disposition: A | Discharge status: Home / Self Care | Payer: BC Managed Care – PPO | Source: Ambulatory Visit | Attending: Internal Medicine | Admitting: Internal Medicine

## 2013-02-06 DIAGNOSIS — M5416 Radiculopathy, lumbar region: Secondary | ICD-10-CM

## 2013-02-07 ENCOUNTER — Encounter: Payer: Self-pay | Admitting: Internal Medicine

## 2013-02-10 ENCOUNTER — Other Ambulatory Visit: Payer: Self-pay | Admitting: Internal Medicine

## 2013-02-11 ENCOUNTER — Other Ambulatory Visit: Payer: Self-pay | Admitting: *Deleted

## 2013-02-11 MED ORDER — SERTRALINE HCL 50 MG PO TABS: ORAL_TABLET | ORAL | Status: DC

## 2013-02-11 NOTE — Telephone Encounter (Signed)
Sent email needing zoloft [facility] to [facility]...lmb

## 2013-02-12 ENCOUNTER — Telehealth: Payer: Self-pay | Admitting: *Deleted

## 2013-02-12 NOTE — Telephone Encounter (Signed)
Left msg on triage stating would like to speak with nurse/md concerning prescription. Pls use ref # [id number]...lmb

## 2013-02-13 ENCOUNTER — Telehealth: Payer: Self-pay | Admitting: *Deleted

## 2013-02-13 DIAGNOSIS — G4700 Insomnia, unspecified: Secondary | ICD-10-CM

## 2013-02-13 MED ORDER — DOXEPIN HCL 10 MG PO CAPS: 10.0000 mg | ORAL_CAPSULE | Freq: Every day | ORAL | Status: DC

## 2013-02-13 NOTE — Telephone Encounter (Signed)
Return call back to [facility], spoke with rep who advised insurance will not cover silenor.  Alternatives covered are zolpidem, velepon, and eszopiclone

## 2013-02-13 NOTE — Telephone Encounter (Signed)
Margaret Norman representative from [facility] called and lvm requesting a return phone call from Margaret Norman' MA concerning a medication for pt. Reference#[id number]. Call back #[phone], between hours 9 to 5:30.

## 2013-02-13 NOTE — Telephone Encounter (Signed)
Try generic doxepin

## 2013-02-26 ENCOUNTER — Encounter: Payer: Self-pay | Admitting: Internal Medicine

## 2013-02-27 ENCOUNTER — Other Ambulatory Visit: Payer: Self-pay | Admitting: Internal Medicine

## 2013-02-27 DIAGNOSIS — M5416 Radiculopathy, lumbar region: Secondary | ICD-10-CM

## 2013-03-10 ENCOUNTER — Encounter: Payer: Self-pay | Admitting: Physical Medicine & Rehabilitation

## 2013-03-15 ENCOUNTER — Encounter: Payer: Self-pay | Admitting: Internal Medicine

## 2013-03-16 ENCOUNTER — Telehealth: Payer: Self-pay

## 2013-03-16 MED ORDER — BUPROPION HCL ER (XL) 150 MG PO TB24: ORAL_TABLET | ORAL | Status: DC

## 2013-03-16 NOTE — Telephone Encounter (Signed)
yes

## 2013-03-16 NOTE — Telephone Encounter (Signed)
Please advise if ok to refill Wellbutrin for this patient, last filled 2012 for 1 year. Thanks

## 2013-03-23 ENCOUNTER — Other Ambulatory Visit: Payer: Self-pay | Admitting: Internal Medicine

## 2013-04-03 ENCOUNTER — Ambulatory Visit: Payer: BC Managed Care – PPO | Admitting: Physical Medicine & Rehabilitation

## 2013-04-03 ENCOUNTER — Encounter: Payer: BC Managed Care – PPO | Attending: Physical Medicine & Rehabilitation

## 2013-04-03 ENCOUNTER — Ambulatory Visit (HOSPITAL_BASED_OUTPATIENT_CLINIC_OR_DEPARTMENT_OTHER): Payer: BC Managed Care – PPO | Admitting: Physical Medicine & Rehabilitation

## 2013-04-03 ENCOUNTER — Encounter: Payer: Self-pay | Admitting: Physical Medicine & Rehabilitation

## 2013-04-03 VITALS — BP 134/70 | HR 80 | Resp 14 | Ht 63.0 in | Wt 175.6 lb

## 2013-04-03 DIAGNOSIS — IMO0002 Reserved for concepts with insufficient information to code with codable children: Secondary | ICD-10-CM

## 2013-04-03 DIAGNOSIS — M545 Low back pain: Secondary | ICD-10-CM

## 2013-04-03 DIAGNOSIS — M47817 Spondylosis without myelopathy or radiculopathy, lumbosacral region: Secondary | ICD-10-CM

## 2013-04-03 DIAGNOSIS — Z5181 Encounter for therapeutic drug level monitoring: Secondary | ICD-10-CM

## 2013-04-03 DIAGNOSIS — Z79899 Other long term (current) drug therapy: Secondary | ICD-10-CM

## 2013-04-03 MED ORDER — GABAPENTIN 300 MG PO CAPS: 300.0000 mg | ORAL_CAPSULE | Freq: Every day | ORAL | Status: DC

## 2013-04-03 NOTE — Patient Instructions (Signed)
Epidural Steroid Injection An epidural steroid injection is given to relieve pain in the neck, back, or legs. This procedure involves injecting a steroid and numbing medicine (local anesthetic) into the epidural space. The epidural space is the space between the outer covering of the spinal cord and the vertebra. The epidural steroid injection helps in reducing the pain that is caused by the irritation or swelling of the nerve root. However, it does not cure the underlying problem. The injection may be given for the following conditions:  Changes in the soft, gel-like cushion between two vertebrae (disk) due to wear and tear.  A reduction in the space within the spinal canal.  Slipped or herniated disk.  Low back (lumbar) sprain.  Sciatica. This is shooting pain that radiates down the buttocks and the back of the leg due to compression of the nerve.  Traumatic compression fracture of the vertebra.  Pain that develops after a surgery of the spine.  Pain that arises after an attack of viral infection affecting the nerves (shingles). LET YOUR CAREGIVER KNOW ABOUT:   Allergies to food or medicine.  Medicines taken, including vitamins, herbs, eyedrops, over-the-counter medicines, and creams.  Use of steroids (by mouth or creams).  Previous problems with anesthetics or numbing medicines.  History of bleeding problems or blood clots.  Previous surgery.  Other health problems, including diabetes and kidney problems.  Possibility of pregnancy, if this applies.     RISKS AND COMPLICATIONS The complications due to the needle insertion are:  Headache.  Bleeding.  Infection.  Allergic reaction to the medicines.  Damage to the nerves. The complications due to the steroid are:  Weight gain.  Hot flashes.  Mood swings.  Lack of sleep.  Increase in blood sugar levels, especially if you are diabetic.  Retention of water. The response to this procedure depends on the  underlying cause of the pain and its duration. Patients who have long-term (chronic) pain are less likely to benefit from epidural steroids than are patients whose pain comes on strong and suddenly. BEFORE THE PROCEDURE   The caregiver may ask about your symptoms, do a detailed exam, and advise some tests. These tests may include imaging studies. Your caregiver may review the results of your tests and discuss the procedure with you.  Ask your caregiver about changing or stopping your regular medicines. You may be advised to stop taking blood-thinning medicines a few days before the procedure.  You may also be given medicines to reduce your anxiety. PROCEDURE  You will remain awake during the whole procedure. Although, you may receive medicine to make you sleepy. You will be asked to lie on your stomach. The site of the injection is cleansed. Then, the injection site is numbed using a small amount of medicine that numbs the area (local anesthetic). A hollow needle is directed through your skin into the epidural space with the help of an X-ray. The X-ray helps to ensure that the steroid is delivered closest to the affected nerve. You may have some minimal discomfort at this time. Once the needle is in the right position, the local anesthetic and the steroid are injected into the epidural space. The needle is then removed. The skin is cleaned and a bandage is applied. The entire procedure takes only a few minutes, although repeated injections may be required (up to 3 to 4 injections over several weeks).  AFTER THE PROCEDURE   You may be monitored for a short time before you go home.    You may feel weakness or numbness in your arm or leg, which disappears within 1 to 2 hours.  Someone must drive you home or accompany you home if you are taking a taxi.  You may be allowed to eat, drink, and take your regular medicine.  Your pain may improve or worsen right after the procedure.  You may feel the  beneficial effect of the steroid a few days later.  You may have soreness at the site of the injection.  If you have only partial relief of the pain, the injection may be repeated once or even twice within 4 to 8 weeks of the initial injection. Document Released: 09/18/2007 Document Revised: 09/03/2011 Document Reviewed: 10/21/2008 [employer] Patient Information 2014 [employer].  

## 2013-04-03 NOTE — Progress Notes (Signed)
Subjective:    Patient ID: Margaret Norman, female    DOB: [birth year], 62 y.o.   MRN: [id number]  HPI  Margaret Norman is a 62 y.o. female who presents for evaluation of low back pain. The patient has had recurrent self limited episodes of low back pain in the past. Symptoms have been present for 12 months and are gradually worsening.  Onset was related to / precipitated by no known injury. The pain is located in the across the lower back and radiates to the right thigh, right lower leg. The pain is described as aching, burning and stabbing and occurs intermittently. See pain scores Symptoms are exacerbated by nothing in particular. Symptoms are improved by acetaminophen and NSAIDs. She has also tried nothing which provided no symptom relief. She has no other symptoms associated with the back pain. The patient has no "red flag" history indicative of complicated back pain. Pain Inventory Average Pain 8 Pain Right Now 0 My pain is intermittent, sharp, burning, stabbing, tingling and aching  In the last 24 hours, has pain interfered with the following? General activity 9 Relation with others 9 Enjoyment of life 9 What TIME of day is your pain at its worst? varies Sleep (in general) Fair  Pain is worse with: walking, bending, sitting and some activites Pain improves with: rest and medication Relief from Meds: 5  Mobility walk without assistance use a cane do you drive?  yes  Function employed # of hrs/week 40 Time [employer]  Neuro/Psych numbness tingling  Prior Studies CT/MRI 02/06/2013 Clinical Data: Low back pain, with right hip/leg weakness and  numbness for 1 year  MRI LUMBAR SPINE WITHOUT CONTRAST  Technique: Multiplanar and multiecho pulse sequences of the lumbar  spine were obtained without intravenous contrast.  Comparison: None.  Findings: For the purposes of this dictation, the lowest well  formed intervertebral disc spaces presumed to be the L5-S1 level,  and  are presumed to be five lumbar type vertebral bodies.  The vertebral bodies are normally aligned with preservation of the  normal lumbar lordosis. Vertebral body heights are preserved.  Signal intensity from the vertebral body bone marrow and spinal  cord is normal. The conus medullaris terminates at the L1-2 level.  At T12 - L1, there is no disc bulge or disc protrusion. No  significant facet arthrosis. No canal or neural foraminal  stenosis.  At L1-2, there is minimal disc bulge without focal disc protrusion.  Mild bilateral facet hypertrophy is present. There is no canal or  neural foraminal stenosis.  At L2-3, there is diffuse disc bulge with degenerative disc  desiccation and intervertebral disc space narrowing. Mild modic  endplate changes are present at this level. There is bilateral  facet hypertrophy, slightly worse on the right. The bulging disc  abuts the transiting left L2 nerve root in the left neural foramen.  No definite focal disc protrusion identified. There is no central  canal stenosis. Mild bilateral foraminal narrowing is present,  slightly worse on the left.  At L3-4, there is prominent bilateral facet arthrosis, right  greater than left. There is diffuse disc bulge with a small  superimposed left foraminal disc protrusion that results in mild  left neural foraminal narrowing. No significant central canal  stenosis. There is mild right foraminal narrowing as well.  At L4-5, there is fairly severe bilateral facet arthrosis, right  greater than left with mild diffuse disc bulge. No focal disc  protrusion. No significant canal or neural  foraminal stenosis.  At L5-S1, there is diffuse degenerative disc bulge with a  superimposed left paracentral disc protrusion that mildly  encroaches on the left lateral recess. The bulging disc closely  approximates the transiting left S1 nerve root without definite  mass effect. There is no canal or neural foraminal stenosis. Mild   bilateral facet arthrosis is present.  Multiple T2 hyperintense cystic lesions are noted within the  kidneys bilaterally. No paravertebral soft tissue abnormalities  identified.  IMPRESSION:  1. Degenerative disc bulge at L2-3 and L3-4 with mild bilateral  foraminal stenosis, greatest at L2-3 on the left.  2. Left foraminal disc protrusion at L3-4 and left paracentral  disc protrusion at L5-S1. 3. Bilateral facet arthrosis at L2-3  through L5-S1, most severe at L4-5 on the right.   Physicians involved in your care Dr Sanda Linger   Family History  Problem Relation Age of Onset  . Stroke Maternal Grandfather   . Coronary artery disease Other   . Skin cancer Other   . Heart attack Other   . Hypertension Other   . Hyperlipidemia Other   . Diabetes Other   . Cancer Other     skin  . Liver disease Mother   . Dementia Mother   . Heart disease Father    History   Social History  . Marital Status: Married    Spouse Name: Margaret Norman    Number of Children: N/A  . 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 Smoking status: Never Smoker   . Smokeless tobacco: Never Used  . Alcohol Use: No  . Drug Use: No  . Sexual Activity: Not Currently   Other Topics Concern  . None   Social History Narrative   Regular Exercise -  NO   Past Surgical History  Procedure Laterality Date  . Tonsillectomy and adenoidectomy    . Cesarean section      x2 ? w/appy  . Knee surgery  11/2005  . Splenectomy    . Esophagogastroduodenoscopy    . Hernia repair     Past Medical History  Diagnosis Date  . Type II or unspecified type diabetes mellitus without mention of complication, not stated as uncontrolled   . Depression   . GERD (gastroesophageal reflux disease)   . Hyperlipidemia   . Nephrolithiasis   . HTN (hypertension)   . Gallstones 11-06  . Chest pain     Nuclear, adenosine,  December, 2013, low risk nuclear scan  with small, moderate in intensity, fixed anteroseptal defect. This is possibly related to an LBBB versus small prior infarct. No ischemia  . LBBB (left bundle branch block)    BP 134/70  Pulse 80  Resp 14  Ht 5\' 3"  (1.6 m)  Wt 175 lb 9.6 oz (79.652 kg)  BMI 31.11 kg/m2  SpO2 94%     Review of Systems  Musculoskeletal: Positive for back pain.  Neurological: Positive for numbness.  All other systems reviewed and are negative.       Objective:   Physical Exam  Nursing note and vitals reviewed. Constitutional: She is oriented to person, place, and time. She appears well-developed.  HENT:  Head: Normocephalic and atraumatic.  Eyes: Conjunctivae and EOM are normal. Pupils are equal, round, and reactive to light.  Neck: Normal range of motion.  Musculoskeletal:       Right hip: Normal.  Left hip: Normal.       Cervical back: Normal.       Thoracic back: Normal.       Lumbar back: She exhibits decreased range of motion. She exhibits no tenderness, no bony tenderness and no deformity.  Neurological: She is alert and oriented to person, place, and time. She has normal strength. A sensory deficit is present.  Reflex Scores:      Patellar reflexes are 1+ on the right side and 2+ on the left side.      Achilles reflexes are 2+ on the right side and 2+ on the left side. Right L3 dermatomal sensory pinprick loss  Psychiatric: She has a normal mood and affect.                         Assessment:    Right L3 radiculitis which has not responded to oral medications and interferes with activities.  Pain correlates with physical exam findings MRI shows no significant nerve root compression so this may represent a chemical radiculitis from inflammatory mediators   Plan:    Natural history and expected course discussed. Questions answered. Transforaminal epidural steroid injection right L3  Physical therapy evaluation Nocturnal neurogenic pain start gabapentin 300  mg each bedtime

## 2013-04-09 ENCOUNTER — Encounter: Payer: Self-pay | Admitting: Internal Medicine

## 2013-04-09 ENCOUNTER — Ambulatory Visit (INDEPENDENT_AMBULATORY_CARE_PROVIDER_SITE_OTHER): Payer: BC Managed Care – PPO | Admitting: Internal Medicine

## 2013-04-09 ENCOUNTER — Telehealth: Payer: Self-pay | Admitting: *Deleted

## 2013-04-09 ENCOUNTER — Other Ambulatory Visit (INDEPENDENT_AMBULATORY_CARE_PROVIDER_SITE_OTHER): Payer: BC Managed Care – PPO

## 2013-04-09 VITALS — BP 128/76 | HR 93 | Temp 97.7°F | Resp 16 | Ht 63.0 in | Wt 172.0 lb

## 2013-04-09 DIAGNOSIS — IMO0002 Reserved for concepts with insufficient information to code with codable children: Secondary | ICD-10-CM

## 2013-04-09 DIAGNOSIS — Z1231 Encounter for screening mammogram for malignant neoplasm of breast: Secondary | ICD-10-CM

## 2013-04-09 DIAGNOSIS — I10 Essential (primary) hypertension: Secondary | ICD-10-CM

## 2013-04-09 DIAGNOSIS — M5416 Radiculopathy, lumbar region: Secondary | ICD-10-CM

## 2013-04-09 DIAGNOSIS — Z23 Encounter for immunization: Secondary | ICD-10-CM

## 2013-04-09 DIAGNOSIS — IMO0001 Reserved for inherently not codable concepts without codable children: Secondary | ICD-10-CM

## 2013-04-09 LAB — BASIC METABOLIC PANEL
BUN: 17 mg/dL (ref 6–23)
CO2: 26 mEq/L (ref 19–32)
Chloride: 104 mEq/L (ref 96–112)
GFR: 80.5 mL/min (ref >60.00)
Sodium: 141 mEq/L (ref 135–145)

## 2013-04-09 MED ORDER — HYDROCODONE-ACETAMINOPHEN 10-325 MG PO TABS: 1.0000 | ORAL_TABLET | Freq: Three times a day (TID) | ORAL | Status: DC | PRN

## 2013-04-09 NOTE — Progress Notes (Signed)
Subjective:    Patient ID: Margaret Norman, female    DOB: 09-May-1951, 62 y.o.   MRN: [id number]  Diabetes She presents for her follow-up diabetic visit. She has type 2 diabetes mellitus. Her disease course has been stable. There are no hypoglycemic associated symptoms. Pertinent negatives for hypoglycemia include no dizziness or tremors. Pertinent negatives for diabetes include no blurred vision, no chest pain, no fatigue, no foot paresthesias, no foot ulcerations, no polydipsia, no polyphagia, no polyuria, no visual change, no weakness and no weight loss. There are no hypoglycemic complications. Symptoms are stable. There are no diabetic complications. Current diabetic treatment includes oral agent (triple therapy) and insulin injections. She is compliant with treatment all of the time. Her weight is stable. She is following a generally healthy diet. Meal planning includes avoidance of concentrated sweets. She has not had a previous visit with a dietician. She participates in exercise intermittently. Her home blood glucose trend is decreasing steadily. An ACE inhibitor/angiotensin II receptor blocker is being taken. She does not see a podiatrist.Eye exam is current.      Review of Systems  Constitutional: Negative.  Negative for fever, chills, weight loss, diaphoresis, activity change, appetite change, fatigue and unexpected weight change.  HENT: Negative.   Eyes: Negative.  Negative for blurred vision.  Respiratory: Negative.  Negative for cough, chest tightness, shortness of breath, wheezing and stridor.   Cardiovascular: Negative.  Negative for chest pain, palpitations and leg swelling.  Gastrointestinal: Negative.  Negative for nausea, vomiting, abdominal pain, diarrhea, constipation and blood in stool.  Endocrine: Negative.  Negative for polydipsia, polyphagia and polyuria.  Genitourinary: Negative.   Musculoskeletal: Positive for back pain (her low back pain has improved some). Negative  for arthralgias, gait problem, joint swelling, myalgias, neck pain and neck stiffness.  Skin: Negative.   Allergic/Immunologic: Negative.   Neurological: Negative.  Negative for dizziness, tremors, syncope, weakness, light-headedness and numbness.  Hematological: Negative.  Negative for adenopathy. Does not bruise/bleed easily.  Psychiatric/Behavioral: Negative.        Objective:   Physical Exam  Vitals reviewed. Constitutional: She is oriented to person, place, and time. She appears well-developed and well-nourished. No distress.  HENT:  Head: Normocephalic and atraumatic.  Mouth/Throat: Oropharynx is clear and moist. No oropharyngeal exudate.  Eyes: Conjunctivae are normal. Right eye exhibits no discharge. Left eye exhibits no discharge. No scleral icterus.  Neck: Normal range of motion. Neck supple. No JVD present. No tracheal deviation present. No thyromegaly present.  Cardiovascular: Normal rate, regular rhythm, normal heart sounds and intact distal pulses.  Exam reveals no gallop and no friction rub.   No murmur heard. Pulmonary/Chest: Effort normal and breath sounds normal. No stridor. No respiratory distress. She has no wheezes. She has no rales. She exhibits no tenderness.  Abdominal: Soft. Bowel sounds are normal. She exhibits no distension and no mass. There is no tenderness. There is no rebound and no guarding.  Musculoskeletal: Normal range of motion. She exhibits no edema and no tenderness.       Lumbar back: Normal. She exhibits normal range of motion, no tenderness, no bony tenderness and no swelling.  Lymphadenopathy:    She has no cervical adenopathy.  Neurological: She is oriented to person, place, and time.  Skin: Skin is warm and dry. No rash noted. She is not diaphoretic. No erythema. No pallor.  Psychiatric: She has a normal mood and affect. Her behavior is normal. Judgment and thought content normal.     Lab  Results  Component Value Date   WBC 16.9* 02/04/2013    HGB 13.8 02/04/2013   HCT 42.7 02/04/2013   PLT 459.0* 02/04/2013   GLUCOSE 152* 02/04/2013   CHOL 204* 02/04/2013   TRIG 145.0 02/04/2013   HDL 42.90 02/04/2013   LDLDIRECT 137.9 02/04/2013   LDLCALC 70 08/07/2011   ALT 22 02/04/2013   AST 17 02/04/2013   NA 142 02/04/2013   K 4.4 02/04/2013   CL 108 02/04/2013   CREATININE 0.7 02/04/2013   BUN 16 02/04/2013   CO2 25 02/04/2013   TSH 1.44 02/04/2013   HGBA1C 9.3* 02/04/2013   MICROALBUR 0.5 11/15/2008       Assessment & Plan:

## 2013-04-09 NOTE — Telephone Encounter (Signed)
Pt called requesting hydrocodone refill.  Please advise 

## 2013-04-09 NOTE — Patient Instructions (Signed)
Type 2 Diabetes Mellitus, Adult Type 2 diabetes mellitus, often simply referred to as type 2 diabetes, is a long-lasting (chronic) disease. In type 2 diabetes, the pancreas does not make enough insulin (a hormone), the cells are less responsive to the insulin that is made (insulin resistance), or both. Normally, insulin moves sugars from food into the tissue cells. The tissue cells use the sugars for energy. The lack of insulin or the lack of normal response to insulin causes excess sugars to build up in the blood instead of going into the tissue cells. As a result, high blood sugar (hyperglycemia) develops. The effect of high sugar (glucose) levels can cause many complications. Type 2 diabetes was also previously called adult-onset diabetes but it can occur at any age.  RISK FACTORS  A person is predisposed to developing type 2 diabetes if someone in the family has the disease and also has one or more of the following primary risk factors:  Overweight.  An inactive lifestyle.  A history of consistently eating high-calorie foods. Maintaining a normal weight and regular physical activity can reduce the chance of developing type 2 diabetes. SYMPTOMS  A person with type 2 diabetes may not show symptoms initially. The symptoms of type 2 diabetes appear slowly. The symptoms include:  Increased thirst (polydipsia).  Increased urination (polyuria).  Increased urination during the night (nocturia).  Weight loss. This weight loss may be rapid.  Frequent, recurring infections.  Tiredness (fatigue).  Weakness.  Vision changes, such as blurred vision.  Fruity smell to your breath.  Abdominal pain.  Nausea or vomiting.  Cuts or bruises which are slow to heal.  Tingling or numbness in the hands or feet. DIAGNOSIS Type 2 diabetes is frequently not diagnosed until complications of diabetes are present. Type 2 diabetes is diagnosed when symptoms or complications are present and when blood  glucose levels are increased. Your blood glucose level may be checked by one or more of the following blood tests:  A fasting blood glucose test. You will not be allowed to eat for at least 8 hours before a blood sample is taken.  A random blood glucose test. Your blood glucose is checked at any time of the day regardless of when you ate.  A hemoglobin A1c blood glucose test. A hemoglobin A1c test provides information about blood glucose control over the previous 3 months.  An oral glucose tolerance test (OGTT). Your blood glucose is measured after you have not eaten (fasted) for 2 hours and then after you drink a glucose-containing beverage. TREATMENT   You may need to take insulin or diabetes medicine daily to keep blood glucose levels in the desired range.  You will need to match insulin dosing with exercise and healthy food choices. The treatment goal is to maintain the before meal blood sugar (preprandial glucose) level at 70 130 mg/dL. HOME CARE INSTRUCTIONS   Have your hemoglobin A1c level checked twice a year.  Perform daily blood glucose monitoring as directed by your caregiver.  Monitor urine ketones when you are ill and as directed by your caregiver.  Take your diabetes medicine or insulin as directed by your caregiver to maintain your blood glucose levels in the desired range.  Never run out of diabetes medicine or insulin. It is needed every day.  Adjust insulin based on your intake of carbohydrates. Carbohydrates can raise blood glucose levels but need to be included in your diet. Carbohydrates provide vitamins, minerals, and fiber which are an essential part of   a healthy diet. Carbohydrates are found in fruits, vegetables, whole grains, dairy products, legumes, and foods containing added sugars.    Eat healthy foods. Alternate 3 meals with 3 snacks.  Lose weight if overweight.  Carry a medical alert card or wear your medical alert jewelry.  Carry a 15 gram  carbohydrate snack with you at all times to treat low blood glucose (hypoglycemia). Some examples of 15 gram carbohydrate snacks include:  Glucose tablets, 3 or 4   Glucose gel, 15 gram tube  Raisins, 2 tablespoons (24 grams)  Jelly beans, 6  Animal crackers, 8  Regular pop, 4 ounces (120 mL)  Gummy treats, 9  Recognize hypoglycemia. Hypoglycemia occurs with blood glucose levels of 70 mg/dL and below. The risk for hypoglycemia increases when fasting or skipping meals, during or after intense exercise, and during sleep. Hypoglycemia symptoms can include:  Tremors or shakes.  Decreased ability to concentrate.  Sweating.  Increased heart rate.  Headache.  Dry mouth.  Hunger.  Irritability.  Anxiety.  Restless sleep.  Altered speech or coordination.  Confusion.  Treat hypoglycemia promptly. If you are alert and able to safely swallow, follow the 15:15 rule:  Take 15 20 grams of rapid-acting glucose or carbohydrate. Rapid-acting options include glucose gel, glucose tablets, or 4 ounces (120 mL) of fruit juice, regular soda, or low fat milk.  Check your blood glucose level 15 minutes after taking the glucose.  Take 15 20 grams more of glucose if the repeat blood glucose level is still 70 mg/dL or below.  Eat a meal or snack within 1 hour once blood glucose levels return to normal.    Be alert to polyuria and polydipsia which are early signs of hyperglycemia. An early awareness of hyperglycemia allows for prompt treatment. Treat hyperglycemia as directed by your caregiver.  Engage in at least 150 minutes of moderate-intensity physical activity a week, spread over at least 3 days of the week or as directed by your caregiver. In addition, you should engage in resistance exercise at least 2 times a week or as directed by your caregiver.  Adjust your medicine and food intake as needed if you start a new exercise or sport.  Follow your sick day plan at any time you  are unable to eat or drink as usual.  Avoid tobacco use.  Limit alcohol intake to no more than 1 drink per day for nonpregnant women and 2 drinks per day for men. You should drink alcohol only when you are also eating food. Talk with your caregiver whether alcohol is safe for you. Tell your caregiver if you drink alcohol several times a week.  Follow up with your caregiver regularly.  Schedule an eye exam soon after the diagnosis of type 2 diabetes and then annually.  Perform daily skin and foot care. Examine your skin and feet daily for cuts, bruises, redness, nail problems, bleeding, blisters, or sores. A foot exam by a caregiver should be done annually.  Brush your teeth and gums at least twice a day and floss at least once a day. Follow up with your dentist regularly.  Share your diabetes management plan with your workplace or school.  Stay up-to-date with immunizations.  Learn to manage stress.  Obtain ongoing diabetes education and support as needed.  Participate in, or seek rehabilitation as needed to maintain or improve independence and quality of life. Request a physical or occupational therapy referral if you are having foot or hand numbness or difficulties with grooming,   dressing, eating, or physical activity. SEEK MEDICAL CARE IF:   You are unable to eat food or drink fluids for more than 6 hours.  You have nausea and vomiting for more than 6 hours.  Your blood glucose level is over 240 mg/dL.  There is a change in mental status.  You develop an additional serious illness.  You have diarrhea for more than 6 hours.  You have been sick or have had a fever for a couple of days and are not getting better.  You have pain during any physical activity.  SEEK IMMEDIATE MEDICAL CARE IF:  You have difficulty breathing.  You have moderate to large ketone levels. MAKE SURE YOU:  Understand these instructions.  Will watch your condition.  Will get help right away if  you are not doing well or get worse. Document Released: 06/11/2005 Document Revised: 03/05/2012 Document Reviewed: 01/08/2012 [employer] Patient Information 2014 [employer].  

## 2013-04-12 ENCOUNTER — Encounter: Payer: Self-pay | Admitting: Internal Medicine

## 2013-04-12 NOTE — Assessment & Plan Note (Signed)
Her BP is well controlled 

## 2013-04-12 NOTE — Assessment & Plan Note (Signed)
She is seeing pain management For now, she will take norco as needed

## 2013-04-12 NOTE — Assessment & Plan Note (Signed)
Her A1C shows some improvement in her blood sugar She will cont the current meds and will work on her lifestyle modifications

## 2013-04-27 ENCOUNTER — Ambulatory Visit: Payer: BC Managed Care – PPO | Attending: Physical Medicine & Rehabilitation | Admitting: Physical Therapy

## 2013-04-27 DIAGNOSIS — IMO0001 Reserved for inherently not codable concepts without codable children: Secondary | ICD-10-CM | POA: Insufficient documentation

## 2013-04-27 DIAGNOSIS — M25569 Pain in unspecified knee: Secondary | ICD-10-CM | POA: Insufficient documentation

## 2013-04-27 DIAGNOSIS — M545 Low back pain, unspecified: Secondary | ICD-10-CM | POA: Insufficient documentation

## 2013-04-30 ENCOUNTER — Ambulatory Visit: Payer: BC Managed Care – PPO | Admitting: Physical Medicine & Rehabilitation

## 2013-05-04 ENCOUNTER — Ambulatory Visit: Payer: BC Managed Care – PPO | Admitting: Rehabilitation

## 2013-05-06 ENCOUNTER — Ambulatory Visit: Payer: BC Managed Care – PPO | Admitting: Rehabilitation

## 2013-05-08 ENCOUNTER — Encounter: Payer: Self-pay | Admitting: Internal Medicine

## 2013-05-11 ENCOUNTER — Encounter: Payer: BC Managed Care – PPO | Admitting: Physical Therapy

## 2013-05-11 ENCOUNTER — Other Ambulatory Visit: Payer: Self-pay | Admitting: Internal Medicine

## 2013-05-12 ENCOUNTER — Encounter: Payer: Self-pay | Admitting: Physical Medicine & Rehabilitation

## 2013-05-12 ENCOUNTER — Telehealth: Payer: Self-pay | Admitting: Internal Medicine

## 2013-05-12 ENCOUNTER — Encounter: Payer: BC Managed Care – PPO | Attending: Physical Medicine & Rehabilitation

## 2013-05-12 ENCOUNTER — Ambulatory Visit (HOSPITAL_BASED_OUTPATIENT_CLINIC_OR_DEPARTMENT_OTHER): Payer: BC Managed Care – PPO | Admitting: Physical Medicine & Rehabilitation

## 2013-05-12 VITALS — BP 146/64 | HR 92 | Resp 14 | Ht 63.0 in | Wt 180.8 lb

## 2013-05-12 DIAGNOSIS — M5416 Radiculopathy, lumbar region: Secondary | ICD-10-CM

## 2013-05-12 DIAGNOSIS — IMO0002 Reserved for concepts with insufficient information to code with codable children: Secondary | ICD-10-CM | POA: Insufficient documentation

## 2013-05-12 DIAGNOSIS — M47817 Spondylosis without myelopathy or radiculopathy, lumbosacral region: Secondary | ICD-10-CM

## 2013-05-12 LAB — HM DIABETES EYE EXAM

## 2013-05-12 MED ORDER — HYDROCODONE-ACETAMINOPHEN 10-325 MG PO TABS: 1.0000 | ORAL_TABLET | Freq: Three times a day (TID) | ORAL | Status: DC | PRN

## 2013-05-12 NOTE — Progress Notes (Signed)
Subjective:    Patient ID: Margaret Norman, female    DOB: 1951/01/04, 62 y.o.   MRN: [id number] Margaret Norman is a 62 y.o. female who presents for evaluation of low back pain. The patient has had recurrent self limited episodes of low back pain in the past. Symptoms have been present for 12 months and are gradually worsening. Onset was related to / precipitated by no known injury. The pain is located in the across the lower back and radiates to the right thigh, right lower leg. The pain is described as aching, burning and stabbing and occurs intermittently. See pain scores Symptoms are exacerbated by nothing in particular. Symptoms are improved by acetaminophen and NSAIDs. She has also tried nothing which provided no symptom relief. She has no other symptoms associated with the back pain. The patient has no "red flag" history indicative of complicated back pain.  HPI Average pain is without meds 8/10 and with med 5/10 Starting PT, doing stretch and strengthening soreness the next day or 2 after a therapy session. We discussed what this means and also discussed long-term effects are positive for pain and function Pain Inventory Average Pain 8 Pain Right Now 6 My pain is constant, sharp, stabbing, tingling and aching  In the last 24 hours, has pain interfered with the following? General activity 8 Relation with others 9 Enjoyment of life 9 What TIME of day is your pain at its worst? all Sleep (in general) Fair  Pain is worse with: walking, bending, sitting, inactivity and standing Pain improves with: rest, heat/ice and medication Relief from Meds: 5  Mobility use a cane ability to climb steps?  yes do you drive?  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  no  Physicians involved in your care Any  changes since last visit?  no   Family History  Problem Relation Age of Onset  . Stroke Maternal Grandfather   . Coronary artery disease Other   . Skin cancer Other   . Heart attack Other   . Hypertension Other   . Hyperlipidemia Other   . Diabetes Other   . Cancer Other     skin  . Liver disease Mother   . Dementia Mother   . Heart disease Father    History   Social History  . Marital Status: Married    Spouse Name: Cambree Hendrix    Number of Children: N/A  . 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 Smoking status: Never Smoker   . Smokeless tobacco: Never Used  . Alcohol Use: No  . Drug Use: No  . Sexual Activity: Not Currently   Other Topics Concern  . None   Social History Narrative   Regular Exercise -  NO   Past Surgical History  Procedure Laterality Date  . Tonsillectomy and adenoidectomy    . Cesarean section      x2 ? w/appy  . Knee surgery  11/2005  . Splenectomy    . Esophagogastroduodenoscopy    . Hernia repair     Past Medical History  Diagnosis Date  . Type II or unspecified type diabetes mellitus without mention of complication, not stated as uncontrolled   . Depression   . GERD (gastroesophageal reflux disease)   .  Hyperlipidemia   . Nephrolithiasis   . HTN (hypertension)   . Gallstones 11-06  . Chest pain     Nuclear, adenosine,  December, 2013, low risk nuclear scan with small, moderate in intensity, fixed anteroseptal defect. This is possibly related to an LBBB versus small prior infarct. No ischemia  . LBBB (left bundle branch block)    BP 146/64  Pulse 92  Resp 14  Ht 5\' 3"  (1.6 m)  Wt 180 lb 12.8 oz (82.01 kg)  BMI 32.04 kg/m2  SpO2 98%   Review of Systems  Constitutional: Positive for diaphoresis.  Respiratory: Positive for shortness of breath.   Gastrointestinal: Positive for nausea.  Endocrine:       High BS  Musculoskeletal: Positive for gait  problem.  Neurological: Positive for dizziness and weakness.       Tingling  Psychiatric/Behavioral: Positive for confusion and dysphoric mood. The patient is nervous/anxious.   All other systems reviewed and are negative.       Objective:   Physical Exam  Nursing note and vitals reviewed. Constitutional: She is oriented to person, place, and time. She appears well-developed and well-nourished.  HENT:  Head: Normocephalic and atraumatic.  Eyes: Conjunctivae and EOM are normal. Pupils are equal, round, and reactive to light.  Neck: Normal range of motion.  Musculoskeletal:  Lumbar range of motion reduced by 25% in all directions but no pain end point  Negative straight leg raising test  Neurological: She is alert and oriented to person, place, and time. A sensory deficit is present. Gait normal.  Reflex Scores:      Patellar reflexes are 0 on the right side and 2+ on the left side.      Achilles reflexes are 1+ on the right side and 1+ on the left side. Right L3 sensory deficit  No gait deviation but uses a cane for balance  Motor strength is 4/5 right quad 5/5 left quad 5/5 bilateral ankle dorsiflexors and plantar flexors          Assessment & Plan:  1. 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 Her symptoms are exacerbated by prolonged standing as would be expected. She has had no falls but sometimes feels a little week in the right lower extremity. I recommend continued physical therapy I also recommend a right L3 selective nerve root block to further assess and treat The patient does not want to consider this option at this time  If her weakness progresses and she falls, I would recommend surgical evaluation. The patient also does not want to consider surgery at this time   The patient is asking about disability for this condition. Given that it is a treatable condition and she has not wanted to pursue more  aggressive treatment, I don't think she would qualify for disability.  I will see her back if she wishes to reconsider the selective nerve root block. She is aware that if her weakness gets worse this would be a more urgent matter and require urgent medical attention.  She will continue her medication management with Dr. Yetta Barre for now.

## 2013-05-12 NOTE — Patient Instructions (Addendum)
There is no contraindications to TENS unit or functional electrical stimulation in a patient with left bundle branch block  Please call the office if you decided to schedule a right L3 transforaminal epidural steroid injection  Continue physical therapy   followup with Dr. Yetta Barre for your medications including hydrocodone

## 2013-05-12 NOTE — Telephone Encounter (Signed)
05/12/2013  Pt needs a refill on RX HYDROcodone-acetaminophen (NORCO) 10-325.  Please contact when available.

## 2013-05-12 NOTE — Telephone Encounter (Signed)
Pt called requesting Hydrocodone refill.  Please advise 

## 2013-05-12 NOTE — Telephone Encounter (Signed)
05/12/2013  Pt needs a refill on RX HYDROcodone-acetaminophen (NORCO) 10-325.  Please contact when available. °

## 2013-05-13 ENCOUNTER — Encounter: Payer: BC Managed Care – PPO | Admitting: Physical Therapy

## 2013-05-13 ENCOUNTER — Other Ambulatory Visit: Payer: Self-pay | Admitting: Internal Medicine

## 2013-05-17 ENCOUNTER — Encounter: Payer: Self-pay | Admitting: Internal Medicine

## 2013-05-17 DIAGNOSIS — I10 Essential (primary) hypertension: Secondary | ICD-10-CM

## 2013-05-17 DIAGNOSIS — E119 Type 2 diabetes mellitus without complications: Secondary | ICD-10-CM

## 2013-05-18 MED ORDER — PIOGLITAZONE HCL 30 MG PO TABS: 30.0000 mg | ORAL_TABLET | Freq: Every day | ORAL | Status: DC

## 2013-05-18 MED ORDER — VALSARTAN 320 MG PO TABS: 320.0000 mg | ORAL_TABLET | Freq: Every day | ORAL | Status: DC

## 2013-05-21 ENCOUNTER — Other Ambulatory Visit: Payer: Self-pay | Admitting: Internal Medicine

## 2013-05-22 ENCOUNTER — Other Ambulatory Visit: Payer: Self-pay | Admitting: Internal Medicine

## 2013-06-01 MED ORDER — VALSARTAN 320 MG PO TABS: 320.0000 mg | ORAL_TABLET | Freq: Every day | ORAL | Status: DC

## 2013-06-01 NOTE — Addendum Note (Signed)
Addended by: Rock Nephew T on: 06/01/2013 08:46 AM   Modules accepted: Orders

## 2013-06-15 ENCOUNTER — Telehealth: Payer: Self-pay | Admitting: Internal Medicine

## 2013-06-15 MED ORDER — PROMETHAZINE-DM 6.25-15 MG/5ML PO SYRP: 5.0000 mL | ORAL_SOLUTION | Freq: Four times a day (QID) | ORAL | Status: DC | PRN

## 2013-06-15 MED ORDER — AZITHROMYCIN 500 MG PO TABS: 500.0000 mg | ORAL_TABLET | Freq: Every day | ORAL | Status: DC

## 2013-06-15 NOTE — Telephone Encounter (Signed)
Pt is aware to check with her pharmacy.

## 2013-06-15 NOTE — Telephone Encounter (Signed)
done

## 2013-06-15 NOTE — Telephone Encounter (Signed)
Pt would like to have something called in for a sinus infection and dry cough.  She has taken Musinex D which has not helped.  No appts here until Wed.

## 2013-06-16 ENCOUNTER — Encounter: Payer: Self-pay | Admitting: Internal Medicine

## 2013-06-24 ENCOUNTER — Other Ambulatory Visit: Payer: Self-pay | Admitting: Internal Medicine

## 2013-07-02 ENCOUNTER — Ambulatory Visit: Payer: BC Managed Care – PPO | Admitting: Physical Medicine & Rehabilitation

## 2013-07-10 ENCOUNTER — Encounter: Payer: Self-pay | Admitting: Internal Medicine

## 2013-07-10 ENCOUNTER — Other Ambulatory Visit: Payer: Self-pay | Admitting: Internal Medicine

## 2013-07-10 DIAGNOSIS — E119 Type 2 diabetes mellitus without complications: Secondary | ICD-10-CM

## 2013-07-10 DIAGNOSIS — M5416 Radiculopathy, lumbar region: Secondary | ICD-10-CM

## 2013-07-10 DIAGNOSIS — IMO0001 Reserved for inherently not codable concepts without codable children: Secondary | ICD-10-CM

## 2013-07-10 DIAGNOSIS — E1165 Type 2 diabetes mellitus with hyperglycemia: Secondary | ICD-10-CM

## 2013-07-10 MED ORDER — HYDROCODONE-ACETAMINOPHEN 10-325 MG PO TABS: 1.0000 | ORAL_TABLET | Freq: Three times a day (TID) | ORAL | Status: DC | PRN

## 2013-07-13 MED ORDER — LANTUS SOLOSTAR 100 UNIT/ML ~~LOC~~ SOPN: PEN_INJECTOR | SUBCUTANEOUS | Status: DC

## 2013-07-13 MED ORDER — OMEPRAZOLE 20 MG PO CPDR: DELAYED_RELEASE_CAPSULE | ORAL | Status: DC

## 2013-07-13 MED ORDER — METFORMIN HCL 1000 MG PO TABS: ORAL_TABLET | ORAL | Status: DC

## 2013-08-11 ENCOUNTER — Ambulatory Visit: Payer: BC Managed Care – PPO | Admitting: Internal Medicine

## 2013-08-17 ENCOUNTER — Encounter: Payer: Self-pay | Admitting: Internal Medicine

## 2013-08-17 ENCOUNTER — Other Ambulatory Visit: Payer: Self-pay | Admitting: Internal Medicine

## 2013-08-17 DIAGNOSIS — M5416 Radiculopathy, lumbar region: Secondary | ICD-10-CM

## 2013-08-17 MED ORDER — HYDROCODONE-ACETAMINOPHEN 10-325 MG PO TABS: 1.0000 | ORAL_TABLET | Freq: Three times a day (TID) | ORAL | Status: DC | PRN

## 2013-08-25 ENCOUNTER — Other Ambulatory Visit (INDEPENDENT_AMBULATORY_CARE_PROVIDER_SITE_OTHER): Payer: BC Managed Care – PPO

## 2013-08-25 ENCOUNTER — Encounter: Payer: Self-pay | Admitting: Internal Medicine

## 2013-08-25 ENCOUNTER — Ambulatory Visit (INDEPENDENT_AMBULATORY_CARE_PROVIDER_SITE_OTHER): Payer: BC Managed Care – PPO | Admitting: Internal Medicine

## 2013-08-25 VITALS — BP 130/82 | HR 87 | Temp 97.7°F | Resp 16 | Ht 63.0 in | Wt 175.0 lb

## 2013-08-25 DIAGNOSIS — Z1231 Encounter for screening mammogram for malignant neoplasm of breast: Secondary | ICD-10-CM

## 2013-08-25 DIAGNOSIS — D72829 Elevated white blood cell count, unspecified: Secondary | ICD-10-CM

## 2013-08-25 DIAGNOSIS — M5416 Radiculopathy, lumbar region: Secondary | ICD-10-CM

## 2013-08-25 DIAGNOSIS — IMO0001 Reserved for inherently not codable concepts without codable children: Secondary | ICD-10-CM

## 2013-08-25 DIAGNOSIS — R5383 Other fatigue: Secondary | ICD-10-CM

## 2013-08-25 DIAGNOSIS — R5381 Other malaise: Secondary | ICD-10-CM

## 2013-08-25 DIAGNOSIS — I447 Left bundle-branch block, unspecified: Secondary | ICD-10-CM

## 2013-08-25 DIAGNOSIS — I10 Essential (primary) hypertension: Secondary | ICD-10-CM

## 2013-08-25 DIAGNOSIS — E785 Hyperlipidemia, unspecified: Secondary | ICD-10-CM

## 2013-08-25 DIAGNOSIS — IMO0002 Reserved for concepts with insufficient information to code with codable children: Secondary | ICD-10-CM

## 2013-08-25 DIAGNOSIS — E1165 Type 2 diabetes mellitus with hyperglycemia: Secondary | ICD-10-CM

## 2013-08-25 LAB — LIPID PANEL
CHOL/HDL RATIO: 4
Cholesterol: 218 mg/dL — ABNORMAL HIGH (ref 0–200)
HDL: 50.1 mg/dL (ref >39.00)
LDL Cholesterol: 125 mg/dL — ABNORMAL HIGH (ref 0–99)
Triglycerides: 216 mg/dL — ABNORMAL HIGH (ref 0.0–149.0)
VLDL: 43.2 mg/dL — ABNORMAL HIGH (ref 0.0–40.0)

## 2013-08-25 LAB — CBC WITH DIFFERENTIAL/PLATELET
Basophils Absolute: 0.2 10*3/uL — ABNORMAL HIGH (ref 0.0–0.1)
Basophils Relative: 1.2 % (ref 0.0–3.0)
Eosinophils Absolute: 0.2 10*3/uL (ref 0.0–0.7)
Eosinophils Relative: 1.3 % (ref 0.0–5.0)
HCT: 43.7 % (ref 36.0–46.0)
Hemoglobin: 13.9 g/dL (ref 12.0–15.0)
Lymphocytes Relative: 37.2 % (ref 12.0–46.0)
Lymphs Abs: 6.7 10*3/uL — ABNORMAL HIGH (ref 0.7–4.0)
MCHC: 31.8 g/dL (ref 30.0–36.0)
MCV: 88 fl (ref 78.0–100.0)
MONOS PCT: 8.2 % (ref 3.0–12.0)
Monocytes Absolute: 1.5 10*3/uL — ABNORMAL HIGH (ref 0.1–1.0)
NEUTROS PCT: 52.1 % (ref 43.0–77.0)
Neutro Abs: 9.4 10*3/uL — ABNORMAL HIGH (ref 1.4–7.7)
PLATELETS: 409 10*3/uL — AB (ref 150.0–400.0)
RBC: 4.97 Mil/uL (ref 3.87–5.11)
RDW: 14.4 % (ref 11.5–14.6)

## 2013-08-25 LAB — BASIC METABOLIC PANEL
BUN: 19 mg/dL (ref 6–23)
CO2: 25 meq/L (ref 19–32)
Calcium: 10.1 mg/dL (ref 8.4–10.5)
Chloride: 104 mEq/L (ref 96–112)
Creatinine, Ser: 0.7 mg/dL (ref 0.4–1.2)
GFR: 91.26 mL/min (ref >60.00)
GLUCOSE: 154 mg/dL — AB (ref 70–99)
POTASSIUM: 4.4 meq/L (ref 3.5–5.1)
SODIUM: 139 meq/L (ref 135–145)

## 2013-08-25 LAB — HEMOGLOBIN A1C: HEMOGLOBIN A1C: 10.8 % — AB (ref 4.6–6.5)

## 2013-08-25 MED ORDER — ALBIGLUTIDE 30 MG ~~LOC~~ PEN: 1.0000 | PEN_INJECTOR | SUBCUTANEOUS | Status: DC

## 2013-08-25 NOTE — Assessment & Plan Note (Signed)
She has fatigue but no other cardiac type s/s Her EKG is unchanged (persistent LBBB and a lot of artifact on today's EKG) She is due for a follow up with cardiology so I have sent a referral

## 2013-08-25 NOTE — Assessment & Plan Note (Signed)
She will cont the current meds for pain I will check her UDS today

## 2013-08-25 NOTE — Assessment & Plan Note (Signed)
Her BP is well controlled 

## 2013-08-25 NOTE — Assessment & Plan Note (Signed)
She has not been using the bydureon b/c she feels like the needle is too large Her A1C remains too high - I have asked her to change to tanzeum She will cont the other meds

## 2013-08-25 NOTE — Patient Instructions (Signed)
Type 2 Diabetes Mellitus, Adult Type 2 diabetes mellitus, often simply referred to as type 2 diabetes, is a long-lasting (chronic) disease. In type 2 diabetes, the pancreas does not make enough insulin (a hormone), the cells are less responsive to the insulin that is made (insulin resistance), or both. Normally, insulin moves sugars from food into the tissue cells. The tissue cells use the sugars for energy. The lack of insulin or the lack of normal response to insulin causes excess sugars to build up in the blood instead of going into the tissue cells. As a result, high blood sugar (hyperglycemia) develops. The effect of high sugar (glucose) levels can cause many complications. Type 2 diabetes was also previously called adult-onset diabetes but it can occur at any age.  RISK FACTORS  A person is predisposed to developing type 2 diabetes if someone in the family has the disease and also has one or more of the following primary risk factors:  Overweight.  An inactive lifestyle.  A history of consistently eating high-calorie foods. Maintaining a normal weight and regular physical activity can reduce the chance of developing type 2 diabetes. SYMPTOMS  A person with type 2 diabetes may not show symptoms initially. The symptoms of type 2 diabetes appear slowly. The symptoms include:  Increased thirst (polydipsia).  Increased urination (polyuria).  Increased urination during the night (nocturia).  Weight loss. This weight loss may be rapid.  Frequent, recurring infections.  Tiredness (fatigue).  Weakness.  Vision changes, such as blurred vision.  Fruity smell to your breath.  Abdominal pain.  Nausea or vomiting.  Cuts or bruises which are slow to heal.  Tingling or numbness in the hands or feet. DIAGNOSIS Type 2 diabetes is frequently not diagnosed until complications of diabetes are present. Type 2 diabetes is diagnosed when symptoms or complications are present and when blood  glucose levels are increased. Your blood glucose level may be checked by one or more of the following blood tests:  A fasting blood glucose test. You will not be allowed to eat for at least 8 hours before a blood sample is taken.  A random blood glucose test. Your blood glucose is checked at any time of the day regardless of when you ate.  A hemoglobin A1c blood glucose test. A hemoglobin A1c test provides information about blood glucose control over the previous 3 months.  An oral glucose tolerance test (OGTT). Your blood glucose is measured after you have not eaten (fasted) for 2 hours and then after you drink a glucose-containing beverage. TREATMENT   You may need to take insulin or diabetes medicine daily to keep blood glucose levels in the desired range.  You will need to match insulin dosing with exercise and healthy food choices. The treatment goal is to maintain the before meal blood sugar (preprandial glucose) level at 70 130 mg/dL. HOME CARE INSTRUCTIONS   Have your hemoglobin A1c level checked twice a year.  Perform daily blood glucose monitoring as directed by your caregiver.  Monitor urine ketones when you are ill and as directed by your caregiver.  Take your diabetes medicine or insulin as directed by your caregiver to maintain your blood glucose levels in the desired range.  Never run out of diabetes medicine or insulin. It is needed every day.  Adjust insulin based on your intake of carbohydrates. Carbohydrates can raise blood glucose levels but need to be included in your diet. Carbohydrates provide vitamins, minerals, and fiber which are an essential part of   a healthy diet. Carbohydrates are found in fruits, vegetables, whole grains, dairy products, legumes, and foods containing added sugars.    Eat healthy foods. Alternate 3 meals with 3 snacks.  Lose weight if overweight.  Carry a medical alert card or wear your medical alert jewelry.  Carry a 15 gram  carbohydrate snack with you at all times to treat low blood glucose (hypoglycemia). Some examples of 15 gram carbohydrate snacks include:  Glucose tablets, 3 or 4   Glucose gel, 15 gram tube  Raisins, 2 tablespoons (24 grams)  Jelly beans, 6  Animal crackers, 8  Regular pop, 4 ounces (120 mL)  Gummy treats, 9  Recognize hypoglycemia. Hypoglycemia occurs with blood glucose levels of 70 mg/dL and below. The risk for hypoglycemia increases when fasting or skipping meals, during or after intense exercise, and during sleep. Hypoglycemia symptoms can include:  Tremors or shakes.  Decreased ability to concentrate.  Sweating.  Increased heart rate.  Headache.  Dry mouth.  Hunger.  Irritability.  Anxiety.  Restless sleep.  Altered speech or coordination.  Confusion.  Treat hypoglycemia promptly. If you are alert and able to safely swallow, follow the 15:15 rule:  Take 15 20 grams of rapid-acting glucose or carbohydrate. Rapid-acting options include glucose gel, glucose tablets, or 4 ounces (120 mL) of fruit juice, regular soda, or low fat milk.  Check your blood glucose level 15 minutes after taking the glucose.  Take 15 20 grams more of glucose if the repeat blood glucose level is still 70 mg/dL or below.  Eat a meal or snack within 1 hour once blood glucose levels return to normal.    Be alert to polyuria and polydipsia which are early signs of hyperglycemia. An early awareness of hyperglycemia allows for prompt treatment. Treat hyperglycemia as directed by your caregiver.  Engage in at least 150 minutes of moderate-intensity physical activity a week, spread over at least 3 days of the week or as directed by your caregiver. In addition, you should engage in resistance exercise at least 2 times a week or as directed by your caregiver.  Adjust your medicine and food intake as needed if you start a new exercise or sport.  Follow your sick day plan at any time you  are unable to eat or drink as usual.  Avoid tobacco use.  Limit alcohol intake to no more than 1 drink per day for nonpregnant women and 2 drinks per day for men. You should drink alcohol only when you are also eating food. Talk with your caregiver whether alcohol is safe for you. Tell your caregiver if you drink alcohol several times a week.  Follow up with your caregiver regularly.  Schedule an eye exam soon after the diagnosis of type 2 diabetes and then annually.  Perform daily skin and foot care. Examine your skin and feet daily for cuts, bruises, redness, nail problems, bleeding, blisters, or sores. A foot exam by a caregiver should be done annually.  Brush your teeth and gums at least twice a day and floss at least once a day. Follow up with your dentist regularly.  Share your diabetes management plan with your workplace or school.  Stay up-to-date with immunizations.  Learn to manage stress.  Obtain ongoing diabetes education and support as needed.  Participate in, or seek rehabilitation as needed to maintain or improve independence and quality of life. Request a physical or occupational therapy referral if you are having foot or hand numbness or difficulties with grooming,   dressing, eating, or physical activity. SEEK MEDICAL CARE IF:   You are unable to eat food or drink fluids for more than 6 hours.  You have nausea and vomiting for more than 6 hours.  Your blood glucose level is over 240 mg/dL.  There is a change in mental status.  You develop an additional serious illness.  You have diarrhea for more than 6 hours.  You have been sick or have had a fever for a couple of days and are not getting better.  You have pain during any physical activity.  SEEK IMMEDIATE MEDICAL CARE IF:  You have difficulty breathing.  You have moderate to large ketone levels. MAKE SURE YOU:  Understand these instructions.  Will watch your condition.  Will get help right away if  you are not doing well or get worse. Document Released: 06/11/2005 Document Revised: 03/05/2012 Document Reviewed: 01/08/2012 [employer] Patient Information 2014 [employer].  

## 2013-08-25 NOTE — Progress Notes (Signed)
Pre visit review using our clinic review tool, if applicable. No additional management support is needed unless otherwise documented below in the visit note. 

## 2013-08-25 NOTE — Progress Notes (Signed)
Subjective:    Patient ID: Margaret Norman, female    DOB: 09-19-1950, 63 y.o.   MRN: [id number]  Diabetes She presents for her follow-up diabetic visit. She has type 2 diabetes mellitus. Her disease course has been fluctuating. There are no hypoglycemic associated symptoms. Pertinent negatives for hypoglycemia include no confusion or nervousness/anxiousness. Associated symptoms include fatigue, polydipsia, polyphagia and polyuria. Pertinent negatives for diabetes include no blurred vision, no chest pain, no foot paresthesias, no visual change, no weakness and no weight loss. There are no hypoglycemic complications. Symptoms are stable. There are no diabetic complications. Current diabetic treatment includes insulin injections and oral agent (dual therapy). She is compliant with treatment most of the time. Her weight is stable. She is following a generally healthy diet. Meal planning includes avoidance of concentrated sweets. She has not had a previous visit with a dietician. She participates in exercise intermittently. There is no change in her home blood glucose trend. Her breakfast blood glucose range is generally 130-140 mg/dl. Her lunch blood glucose range is generally 140-180 mg/dl. Her dinner blood glucose range is generally 180-200 mg/dl. Her highest blood glucose is 180-200 mg/dl. Her overall blood glucose range is 140-180 mg/dl. An ACE inhibitor/angiotensin II receptor blocker is being taken. She does not see a podiatrist.Eye exam is current.      Review of Systems  Constitutional: Positive for fatigue. Negative for fever, chills, weight loss, diaphoresis, activity change and appetite change.  HENT: Negative.   Eyes: Negative.  Negative for blurred vision.  Respiratory: Negative.  Negative for cough, choking, chest tightness, shortness of breath and stridor.   Cardiovascular: Negative.  Negative for chest pain, palpitations and leg swelling.  Gastrointestinal: Negative.  Negative for  nausea, vomiting, abdominal pain, diarrhea, constipation and blood in stool.  Endocrine: Positive for polydipsia, polyphagia and polyuria.  Genitourinary: Negative.   Musculoskeletal: Positive for back pain. Negative for arthralgias, gait problem, joint swelling, myalgias, neck pain and neck stiffness.  Skin: Negative.   Allergic/Immunologic: Negative.   Neurological: Negative.  Negative for weakness.  Hematological: Negative.  Negative for adenopathy. Does not bruise/bleed easily.  Psychiatric/Behavioral: Positive for sleep disturbance. Negative for suicidal ideas, hallucinations, behavioral problems, confusion, self-injury, dysphoric mood, decreased concentration and agitation. The patient is not nervous/anxious and is not hyperactive.        Objective:   Physical Exam  Vitals reviewed. Constitutional: She is oriented to person, place, and time. She appears well-developed and well-nourished. No distress.  HENT:  Head: Normocephalic and atraumatic.  Mouth/Throat: Oropharynx is clear and moist. No oropharyngeal exudate.  Eyes: Conjunctivae are normal. Right eye exhibits no discharge. Left eye exhibits no discharge. No scleral icterus.  Neck: Normal range of motion. Neck supple. No JVD present. No tracheal deviation present. No thyromegaly present.  Cardiovascular: Normal rate, regular rhythm, normal heart sounds and intact distal pulses.  Exam reveals no gallop and no friction rub.   No murmur heard. Pulmonary/Chest: Effort normal and breath sounds normal. No stridor. No respiratory distress. She has no wheezes. She has no rales. She exhibits no tenderness.  Abdominal: Soft. Bowel sounds are normal. She exhibits no distension and no mass. There is no tenderness. There is no rebound and no guarding.  Musculoskeletal: Normal range of motion. She exhibits no edema and no tenderness.  Lymphadenopathy:    She has no cervical adenopathy.  Neurological: She is oriented to person, place, and  time.  Skin: Skin is warm and dry. No rash noted. She is  not diaphoretic. No erythema. No pallor.  Psychiatric: She has a normal mood and affect. Her behavior is normal. Judgment and thought content normal.     Lab Results  Component Value Date   WBC 16.9* 02/04/2013   HGB 13.8 02/04/2013   HCT 42.7 02/04/2013   PLT 459.0* 02/04/2013   GLUCOSE 126* 04/09/2013   CHOL 204* 02/04/2013   TRIG 145.0 02/04/2013   HDL 42.90 02/04/2013   LDLDIRECT 137.9 02/04/2013   LDLCALC 70 08/07/2011   ALT 22 02/04/2013   AST 17 02/04/2013   NA 141 04/09/2013   K 4.3 04/09/2013   CL 104 04/09/2013   CREATININE 0.8 04/09/2013   BUN 17 04/09/2013   CO2 26 04/09/2013   TSH 1.44 02/04/2013   HGBA1C 9.0* 04/09/2013   MICROALBUR 0.5 11/15/2008       Assessment & Plan:

## 2013-09-17 ENCOUNTER — Ambulatory Visit: Payer: BC Managed Care – PPO | Admitting: Cardiology

## 2013-09-18 ENCOUNTER — Encounter: Payer: Self-pay | Admitting: Internal Medicine

## 2013-09-19 ENCOUNTER — Other Ambulatory Visit: Payer: Self-pay | Admitting: Internal Medicine

## 2013-09-19 ENCOUNTER — Encounter: Payer: Self-pay | Admitting: Internal Medicine

## 2013-09-21 ENCOUNTER — Other Ambulatory Visit: Payer: Self-pay | Admitting: Internal Medicine

## 2013-09-21 DIAGNOSIS — M5416 Radiculopathy, lumbar region: Secondary | ICD-10-CM

## 2013-09-21 MED ORDER — HYDROCODONE-ACETAMINOPHEN 10-325 MG PO TABS: 1.0000 | ORAL_TABLET | Freq: Three times a day (TID) | ORAL | Status: DC | PRN

## 2013-10-11 ENCOUNTER — Encounter: Payer: Self-pay | Admitting: Internal Medicine

## 2013-10-11 ENCOUNTER — Other Ambulatory Visit: Payer: Self-pay | Admitting: Physical Medicine & Rehabilitation

## 2013-10-11 DIAGNOSIS — I10 Essential (primary) hypertension: Secondary | ICD-10-CM

## 2013-10-11 DIAGNOSIS — IMO0001 Reserved for inherently not codable concepts without codable children: Secondary | ICD-10-CM

## 2013-10-11 DIAGNOSIS — E1165 Type 2 diabetes mellitus with hyperglycemia: Principal | ICD-10-CM

## 2013-10-12 MED ORDER — OLMESARTAN MEDOXOMIL 40 MG PO TABS: 40.0000 mg | ORAL_TABLET | Freq: Every day | ORAL | Status: DC

## 2013-10-19 ENCOUNTER — Encounter: Payer: Self-pay | Admitting: Internal Medicine

## 2013-10-20 ENCOUNTER — Other Ambulatory Visit: Payer: Self-pay | Admitting: Internal Medicine

## 2013-10-20 DIAGNOSIS — E1165 Type 2 diabetes mellitus with hyperglycemia: Principal | ICD-10-CM

## 2013-10-20 DIAGNOSIS — IMO0001 Reserved for inherently not codable concepts without codable children: Secondary | ICD-10-CM

## 2013-10-20 MED ORDER — DULAGLUTIDE 1.5 MG/0.5ML ~~LOC~~ SOAJ: 1.0000 | SUBCUTANEOUS | Status: DC

## 2013-10-22 ENCOUNTER — Telehealth: Payer: Self-pay | Admitting: Internal Medicine

## 2013-10-22 DIAGNOSIS — M5416 Radiculopathy, lumbar region: Secondary | ICD-10-CM

## 2013-10-22 MED ORDER — HYDROCODONE-ACETAMINOPHEN 10-325 MG PO TABS: 1.0000 | ORAL_TABLET | Freq: Three times a day (TID) | ORAL | Status: DC | PRN

## 2013-10-22 NOTE — Telephone Encounter (Signed)
Patient called informed to pickup hardcopy at the front desk.

## 2013-10-22 NOTE — Telephone Encounter (Signed)
Done hardcopy to robin  

## 2013-11-05 LAB — HM MAMMOGRAPHY: HM Mammogram: NORMAL

## 2013-11-05 LAB — HM PAP SMEAR

## 2013-11-08 ENCOUNTER — Encounter: Payer: Self-pay | Admitting: Internal Medicine

## 2013-11-11 ENCOUNTER — Ambulatory Visit: Payer: BC Managed Care – PPO | Admitting: Internal Medicine

## 2013-12-01 ENCOUNTER — Other Ambulatory Visit: Payer: Self-pay | Admitting: Internal Medicine

## 2013-12-01 DIAGNOSIS — M5416 Radiculopathy, lumbar region: Secondary | ICD-10-CM

## 2013-12-02 ENCOUNTER — Other Ambulatory Visit: Payer: Self-pay | Admitting: Internal Medicine

## 2013-12-02 DIAGNOSIS — M5416 Radiculopathy, lumbar region: Secondary | ICD-10-CM

## 2013-12-02 MED ORDER — HYDROCODONE-ACETAMINOPHEN 10-325 MG PO TABS: 1.0000 | ORAL_TABLET | Freq: Three times a day (TID) | ORAL | Status: DC | PRN

## 2013-12-28 ENCOUNTER — Ambulatory Visit: Payer: BC Managed Care – PPO | Admitting: Internal Medicine

## 2013-12-28 ENCOUNTER — Encounter: Payer: Self-pay | Admitting: Internal Medicine

## 2013-12-28 ENCOUNTER — Ambulatory Visit (INDEPENDENT_AMBULATORY_CARE_PROVIDER_SITE_OTHER): Payer: BC Managed Care – PPO

## 2013-12-28 ENCOUNTER — Ambulatory Visit (INDEPENDENT_AMBULATORY_CARE_PROVIDER_SITE_OTHER): Payer: BC Managed Care – PPO | Admitting: Internal Medicine

## 2013-12-28 VITALS — BP 114/76 | HR 93 | Temp 98.5°F | Resp 16 | Ht 63.0 in | Wt 161.0 lb

## 2013-12-28 DIAGNOSIS — IMO0001 Reserved for inherently not codable concepts without codable children: Secondary | ICD-10-CM

## 2013-12-28 DIAGNOSIS — K2100 Gastro-esophageal reflux disease with esophagitis, without bleeding: Secondary | ICD-10-CM

## 2013-12-28 DIAGNOSIS — R1013 Epigastric pain: Secondary | ICD-10-CM

## 2013-12-28 DIAGNOSIS — D72829 Elevated white blood cell count, unspecified: Secondary | ICD-10-CM

## 2013-12-28 DIAGNOSIS — I10 Essential (primary) hypertension: Secondary | ICD-10-CM

## 2013-12-28 DIAGNOSIS — E1165 Type 2 diabetes mellitus with hyperglycemia: Secondary | ICD-10-CM

## 2013-12-28 DIAGNOSIS — M5416 Radiculopathy, lumbar region: Secondary | ICD-10-CM

## 2013-12-28 DIAGNOSIS — IMO0002 Reserved for concepts with insufficient information to code with codable children: Secondary | ICD-10-CM

## 2013-12-28 LAB — CBC WITH DIFFERENTIAL/PLATELET
Basophils Absolute: 0.1 10*3/uL (ref 0.0–0.1)
Basophils Relative: 0.1 % (ref 0.0–3.0)
EOS PCT: 0 % (ref 0.0–5.0)
Eosinophils Absolute: 0 10*3/uL (ref 0.0–0.7)
HCT: 38.4 % (ref 36.0–46.0)
HEMOGLOBIN: 12.5 g/dL (ref 12.0–15.0)
LYMPHS ABS: 4.1 10*3/uL — AB (ref 0.7–4.0)
LYMPHS PCT: 8 % — AB (ref 12.0–46.0)
MCHC: 32.5 g/dL (ref 30.0–36.0)
MCV: 87 fl (ref 78.0–100.0)
MONOS PCT: 2.7 % — AB (ref 3.0–12.0)
Monocytes Absolute: 1.4 10*3/uL — ABNORMAL HIGH (ref 0.1–1.0)
NEUTROS ABS: 45.6 10*3/uL — AB (ref 1.4–7.7)
Neutrophils Relative %: 89.2 % — ABNORMAL HIGH (ref 43.0–77.0)
Platelets: 541 10*3/uL — ABNORMAL HIGH (ref 150.0–400.0)
RBC: 4.41 Mil/uL (ref 3.87–5.11)
RDW: 15.5 % (ref 11.5–15.5)

## 2013-12-28 LAB — URINALYSIS, ROUTINE W REFLEX MICROSCOPIC
BILIRUBIN URINE: NEGATIVE
Ketones, ur: NEGATIVE
Nitrite: NEGATIVE
Specific Gravity, Urine: 1.015 (ref 1.000–1.030)
UROBILINOGEN UA: 0.2 (ref 0.0–1.0)
pH: 6 (ref 5.0–8.0)

## 2013-12-28 LAB — COMPREHENSIVE METABOLIC PANEL
ALT: 19 U/L (ref 0–35)
AST: 16 U/L (ref 0–37)
Albumin: 4 g/dL (ref 3.5–5.2)
Alkaline Phosphatase: 74 U/L (ref 39–117)
BUN: 21 mg/dL (ref 6–23)
CALCIUM: 9.8 mg/dL (ref 8.4–10.5)
CHLORIDE: 104 meq/L (ref 96–112)
CO2: 25 meq/L (ref 19–32)
CREATININE: 0.9 mg/dL (ref 0.4–1.2)
GFR: 63.8 mL/min (ref >60.00)
Glucose, Bld: 221 mg/dL — ABNORMAL HIGH (ref 70–99)
POTASSIUM: 4.3 meq/L (ref 3.5–5.1)
Sodium: 141 mEq/L (ref 135–145)
Total Bilirubin: 0.5 mg/dL (ref 0.2–1.2)
Total Protein: 7.9 g/dL (ref 6.0–8.3)

## 2013-12-28 LAB — HEMOGLOBIN A1C: Hgb A1c MFr Bld: 9 % — ABNORMAL HIGH (ref 4.6–6.5)

## 2013-12-28 LAB — LIPASE: Lipase: 20 U/L (ref 11.0–59.0)

## 2013-12-28 LAB — AMYLASE: AMYLASE: 9 U/L — AB (ref 27–131)

## 2013-12-28 NOTE — Progress Notes (Signed)
Subjective:    Patient ID: Margaret Norman, female    DOB: [birth year], 63 y.o.   MRN: [id number]  Gastrophageal Reflux She complains of abdominal pain, dysphagia, early satiety, heartburn and nausea. She reports no belching, no chest pain, no choking, no coughing, no globus sensation, no hoarse voice, no sore throat, no stridor, no tooth decay, no water brash or no wheezing. This is a chronic problem. The problem has been gradually worsening. The heartburn duration is several minutes. The heartburn is located in the substernum. The heartburn is of moderate intensity. The heartburn does not wake her from sleep. The heartburn does not limit her activity. The heartburn doesn't change with position. Nothing aggravates the symptoms. Associated symptoms include anemia, fatigue and weight loss. Pertinent negatives include no melena, muscle weakness or orthopnea. Risk factors include obesity. She has tried a PPI for the symptoms. The treatment provided moderate relief. Past procedures include an EGD.      Review of Systems  Constitutional: Positive for weight loss, diaphoresis (night sweats), fatigue and unexpected weight change. Negative for fever, chills, activity change and appetite change.  HENT: Positive for trouble swallowing. Negative for hoarse voice, sore throat, tinnitus and voice change.   Eyes: Negative.   Respiratory: Negative.  Negative for apnea, cough, choking, chest tightness, shortness of breath, wheezing and stridor.   Cardiovascular: Negative.  Negative for chest pain, palpitations and leg swelling.  Gastrointestinal: Positive for heartburn, dysphagia, nausea, vomiting, abdominal pain, diarrhea and constipation. Negative for blood in stool, melena, abdominal distention, anal bleeding and rectal pain.  Endocrine: Negative.   Genitourinary: Negative.   Musculoskeletal: Positive for back pain. Negative for arthralgias, gait problem, joint swelling, muscle weakness, myalgias, neck pain  and neck stiffness.  Skin: Negative.  Negative for rash.  Allergic/Immunologic: Negative.   Neurological: Negative.   Hematological: Negative.  Negative for adenopathy. Does not bruise/bleed easily.  Psychiatric/Behavioral: Negative.        Objective:   Physical Exam  Vitals reviewed. Constitutional: She is oriented to person, place, and time. She appears well-developed and well-nourished.  Non-toxic appearance. She does not have a sickly appearance. She does not appear ill. No distress.  HENT:  Head: Normocephalic and atraumatic.  Mouth/Throat: Oropharynx is clear and moist. No oropharyngeal exudate.  Eyes: Conjunctivae are normal. Right eye exhibits no discharge. Left eye exhibits no discharge. No scleral icterus.  Neck: Normal range of motion. Neck supple. No JVD present. No tracheal deviation present. No thyromegaly present.  Cardiovascular: Normal rate, regular rhythm, normal heart sounds and intact distal pulses.  Exam reveals no gallop and no friction rub.   No murmur heard. Pulmonary/Chest: Effort normal and breath sounds normal. No stridor. No respiratory distress. She has no wheezes. She has no rales. She exhibits no tenderness.  Abdominal: Soft. Normal appearance. She exhibits no shifting dullness, no distension, no pulsatile liver, no fluid wave, no abdominal bruit, no ascites, no pulsatile midline mass and no mass. Bowel sounds are decreased. There is no hepatosplenomegaly, splenomegaly or hepatomegaly. There is tenderness in the epigastric area. There is no rebound, no guarding, no CVA tenderness, no tenderness at McBurney's point and negative Murphy's sign. No hernia. Hernia confirmed negative in the ventral area, confirmed negative in the right inguinal area and confirmed negative in the left inguinal area.  Musculoskeletal: Normal range of motion. She exhibits no edema and no tenderness.  Lymphadenopathy:    She has no cervical adenopathy.  Neurological: She is oriented to  person, place,  and time.  Skin: Skin is warm and dry. No rash noted. She is not diaphoretic. No erythema. No pallor.  Psychiatric: She has a normal mood and affect. Her behavior is normal. Judgment and thought content normal.    Lab Results  Component Value Date   WBC 18.1 Repeated and verified X2.* 08/25/2013   HGB 13.9 08/25/2013   HCT 43.7 08/25/2013   PLT 409.0* 08/25/2013   GLUCOSE 154* 08/25/2013   CHOL 218* 08/25/2013   TRIG 216.0* 08/25/2013   HDL 50.10 08/25/2013   LDLDIRECT 137.9 02/04/2013   LDLCALC 125* 08/25/2013   ALT 22 02/04/2013   AST 17 02/04/2013   NA 139 08/25/2013   K 4.4 08/25/2013   CL 104 08/25/2013   CREATININE 0.7 08/25/2013   BUN 19 08/25/2013   CO2 25 08/25/2013   TSH 1.44 02/04/2013   HGBA1C 10.8* 08/25/2013   MICROALBUR 0.5 11/15/2008        Assessment & Plan:

## 2013-12-28 NOTE — Patient Instructions (Signed)

## 2013-12-28 NOTE — Progress Notes (Signed)
Pre visit review using our clinic review tool, if applicable. No additional management support is needed unless otherwise documented below in the visit note. 

## 2013-12-29 ENCOUNTER — Telehealth: Payer: Self-pay | Admitting: *Deleted

## 2013-12-29 ENCOUNTER — Telehealth: Payer: Self-pay | Admitting: Internal Medicine

## 2013-12-29 ENCOUNTER — Encounter: Payer: Self-pay | Admitting: Internal Medicine

## 2013-12-29 DIAGNOSIS — IMO0001 Reserved for inherently not codable concepts without codable children: Secondary | ICD-10-CM

## 2013-12-29 DIAGNOSIS — E1165 Type 2 diabetes mellitus with hyperglycemia: Principal | ICD-10-CM

## 2013-12-29 LAB — PATHOLOGIST SMEAR REVIEW

## 2013-12-29 NOTE — Assessment & Plan Note (Signed)
She will cont the current meds for pain 

## 2013-12-29 NOTE — Telephone Encounter (Signed)
Relevant patient education assigned to patient using Emmi. ° °

## 2013-12-29 NOTE — Telephone Encounter (Signed)
Patient has an appointment A1c, bmet ordered Diabetic bundle 

## 2013-12-29 NOTE — Assessment & Plan Note (Signed)
Her exam is benign Her LFT's and pancreatic enzymes are normal The WBC ct is high but I don't think this is related to the pain She will see GI to consider upper endoscopy

## 2013-12-29 NOTE — Assessment & Plan Note (Signed)
She has developed some alarming s/s so I have asked her to see GI to consider an upper endoscopy

## 2013-12-29 NOTE — Assessment & Plan Note (Signed)
Her WBC count has dramatically increased to 51k and there are some abnormal cells - I am concerned that there has been a malignant transformation so I have sent a referral to hematology

## 2013-12-29 NOTE — Assessment & Plan Note (Signed)
Her A1C has improved She will cont to work on her lifestyle modifications

## 2013-12-30 ENCOUNTER — Telehealth: Payer: Self-pay | Admitting: Hematology and Oncology

## 2013-12-30 LAB — SPEP & IFE WITH QIG
ALPHA-2-GLOBULIN: 14.4 % — AB (ref 7.1–11.8)
Albumin ELP: 53.8 % — ABNORMAL LOW (ref 55.8–66.1)
Alpha-1-Globulin: 8.8 % — ABNORMAL HIGH (ref 2.9–4.9)
Beta 2: 5.7 % (ref 3.2–6.5)
Beta Globulin: 7.4 % — ABNORMAL HIGH (ref 4.7–7.2)
Gamma Globulin: 9.9 % — ABNORMAL LOW (ref 11.1–18.8)
IGG (IMMUNOGLOBIN G), SERUM: 655 mg/dL — AB (ref 690–1700)
IgA: 182 mg/dL (ref 69–380)
IgM, Serum: 236 mg/dL (ref 52–322)
Total Protein, Serum Electrophoresis: 7.3 g/dL (ref 6.0–8.3)

## 2013-12-30 NOTE — Telephone Encounter (Signed)
S/W PATIENT AND GAVE NP APPT FOR 07/16 @ 2 W/DR. [place].  [place]

## 2014-01-03 ENCOUNTER — Other Ambulatory Visit: Payer: Self-pay | Admitting: Internal Medicine

## 2014-01-04 ENCOUNTER — Other Ambulatory Visit: Payer: Self-pay | Admitting: Internal Medicine

## 2014-01-04 ENCOUNTER — Other Ambulatory Visit: Payer: Self-pay

## 2014-01-04 ENCOUNTER — Encounter: Payer: Self-pay | Admitting: Gastroenterology

## 2014-01-04 DIAGNOSIS — M5416 Radiculopathy, lumbar region: Secondary | ICD-10-CM

## 2014-01-04 MED ORDER — HYDROCODONE-ACETAMINOPHEN 10-325 MG PO TABS: 1.0000 | ORAL_TABLET | Freq: Three times a day (TID) | ORAL | Status: DC | PRN

## 2014-01-07 ENCOUNTER — Ambulatory Visit (HOSPITAL_BASED_OUTPATIENT_CLINIC_OR_DEPARTMENT_OTHER): Payer: BC Managed Care – PPO | Admitting: Hematology and Oncology

## 2014-01-07 ENCOUNTER — Encounter: Payer: Self-pay | Admitting: Hematology and Oncology

## 2014-01-07 ENCOUNTER — Ambulatory Visit: Payer: BC Managed Care – PPO

## 2014-01-07 ENCOUNTER — Telehealth: Payer: Self-pay | Admitting: Hematology and Oncology

## 2014-01-07 VITALS — BP 140/70 | HR 99 | Temp 97.0°F | Resp 18 | Ht 63.0 in | Wt 160.0 lb

## 2014-01-07 DIAGNOSIS — D473 Essential (hemorrhagic) thrombocythemia: Secondary | ICD-10-CM

## 2014-01-07 DIAGNOSIS — Z9081 Acquired absence of spleen: Secondary | ICD-10-CM

## 2014-01-07 DIAGNOSIS — Z9089 Acquired absence of other organs: Secondary | ICD-10-CM

## 2014-01-07 DIAGNOSIS — IMO0001 Reserved for inherently not codable concepts without codable children: Secondary | ICD-10-CM

## 2014-01-07 DIAGNOSIS — D72829 Elevated white blood cell count, unspecified: Secondary | ICD-10-CM

## 2014-01-07 DIAGNOSIS — R7989 Other specified abnormal findings of blood chemistry: Secondary | ICD-10-CM

## 2014-01-07 DIAGNOSIS — E1142 Type 2 diabetes mellitus with diabetic polyneuropathy: Secondary | ICD-10-CM

## 2014-01-07 DIAGNOSIS — R634 Abnormal weight loss: Secondary | ICD-10-CM

## 2014-01-07 DIAGNOSIS — R61 Generalized hyperhidrosis: Secondary | ICD-10-CM | POA: Insufficient documentation

## 2014-01-07 DIAGNOSIS — D75838 Other thrombocytosis: Secondary | ICD-10-CM

## 2014-01-07 DIAGNOSIS — E1149 Type 2 diabetes mellitus with other diabetic neurological complication: Secondary | ICD-10-CM

## 2014-01-07 DIAGNOSIS — E1165 Type 2 diabetes mellitus with hyperglycemia: Secondary | ICD-10-CM

## 2014-01-07 HISTORY — DX: Other thrombocytosis: D75.838

## 2014-01-07 HISTORY — DX: Acquired absence of spleen: Z90.81

## 2014-01-07 NOTE — Assessment & Plan Note (Signed)
The cause is unknown. As mentioned above, if additional workup with blood work was not revealing, she will need imaging study to exclude malignancy.

## 2014-01-07 NOTE — Progress Notes (Signed)
[place] NOTE  Patient Care Team: Janith Lima, MD as PCP - General  CHIEF COMPLAINTS/PURPOSE OF CONSULTATION:  Worsening leukocytosis, thrombocytosis, night sweats and weight loss  HISTORY OF PRESENTING ILLNESS:  Margaret Norman 63 y.o. female is here because of elevated WBC. The patient had history of splenectomy 5 years ago. 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 She had extensive workup 5 years ago by another hematologist and overall conclusion was the abnormal CBC was due to her postsplenectomy state. Recently, repeat CBC show significant elevated white count of greater than 50,000 and platelet count greater than 500,000. She denies recent infection. Denies recent prednisone therapy. She complained of recent anorexia, regular in night sweats and weight loss of over 50 pounds in one year. The patient had very poorly controlled diabetes with now new onset of peripheral neuropathy.  There is not reported symptoms of sinus congestion, cough, urinary frequency/urgency or dysuria, diarrhea, joint swelling/pain or abnormal skin rash. He has chronic arthritis especially in the knees. She had no prior history or diagnosis of cancer. Her age appropriate screening programs are up-to-date. The patient has no prior diagnosis of autoimmune disease and was not prescribed corticosteroids related products.  MEDICAL HISTORY:  Past Medical History  Diagnosis Date  . Type II or unspecified type diabetes mellitus without mention of complication, not stated as uncontrolled   . Depression   . GERD (gastroesophageal reflux disease)   . Hyperlipidemia   . Nephrolithiasis   . HTN (hypertension)   . Gallstones 11-06  . Chest pain     Nuclear, adenosine,  December, 2013, low risk nuclear scan with small, moderate in intensity, fixed anteroseptal defect. This is possibly related to an LBBB  versus small prior infarct. No ischemia  . LBBB (left bundle branch block)   . 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 Tonsillectomy and adenoidectomy    . Cesarean section      x2 ? w/appy  . Knee surgery  11/2005  . Splenectomy    . Esophagogastroduodenoscopy    . Hernia repair    . Pancreatectomy    . Cholecystectomy    . Liver biopsy      SOCIAL HISTORY: History   Social History  . Marital Status: Married    Spouse Name: Aleksia Freiman    Number of Children: N/A  . Years of Education: N/A   Occupational History  . [employer]   Social History Main Topics  . Smoking status: Never Smoker   . Smokeless tobacco: Never Used  . Alcohol Use: No  . Drug Use: No  . Sexual Activity: Not Currently   Other Topics Concern  . Not on file   Social History Narrative   Regular Exercise -  NO    FAMILY HISTORY: Family History  Problem Relation Age of Onset  . Stroke Maternal Grandfather   . Coronary artery disease Other   . Skin cancer Other   . Heart attack Other   . Hypertension Other   . Hyperlipidemia Other   . Diabetes Other   . Cancer Other     skin  . Liver disease Mother   . Dementia Mother   . Heart disease Father   . Cancer Paternal Uncle     unknown  . Cancer Other  leukemia    ALLERGIES:  is allergic to amoxicillin.  MEDICATIONS:  Current Outpatient Prescriptions  Medication Sig Dispense Refill  . aspirin 81 MG EC tablet Take 81 mg by mouth daily.        . B-D ULTRAFINE III SHORT PEN 31G X 8 MM MISC USE DAILY WITH LANTUS INSULIN  1 each  3  . buPROPion (WELLBUTRIN XL) 150 MG 24 hr tablet Take 150 mg by mouth every morning.  90 tablet  3  . Canagliflozin (INVOKANA) 300 MG TABS Take 1 tablet by mouth daily.  90 tablet  3  . Cyanocobalamin (VITAMIN B12 PO) Take 2,500 mcg by mouth daily.      Marland Kitchen docusate sodium (COLACE) 100 MG capsule Take 100 mg by mouth  daily.      . Doxepin HCl (SILENOR) 6 MG TABS Take 6 mg by mouth at bedtime.      . Dulaglutide (TRULICITY) 1.5 KK/9.3GH SOPN Inject 1 Act into the skin once a week.  12 pen  3  . fluticasone (FLONASE) 50 MCG/ACT nasal spray Place 2 sprays into the nose daily.  16 g  6  . gabapentin (NEURONTIN) 300 MG capsule TAKE 1 CAPSULE AT BEDTIME  30 capsule  0  . glucose blood (FREESTYLE LITE) test strip Test three times daily as directed  300 each  3  . HYDROcodone-acetaminophen (NORCO) 10-325 MG per tablet Take 1 tablet by mouth every 8 (eight) hours as needed.  90 tablet  0  . Lancets (FREESTYLE) lancets Test three times daily as directed  300 each  3  . LANTUS SOLOSTAR 100 UNIT/ML Solostar Pen INJECT 30 UNITS INTO THE SKIN AT BEDTIME  15 pen  4  . metFORMIN (GLUCOPHAGE) 1000 MG tablet TAKE 1 TABLET TWICE A DAY  180 tablet  3  . mometasone-formoterol (DULERA) 200-5 MCG/ACT AERO Inhale 2 puffs into the lungs 2 (two) times daily.  1 Inhaler  11  . Multiple Vitamin (MULTIVITAMIN) capsule Take 1 capsule by mouth daily.      . niacin (NIASPAN) 500 MG CR tablet Take 1,000 mg by mouth at bedtime.        Marland Kitchen omeprazole (PRILOSEC) 20 MG capsule TAKE 2 CAPSULES DAILY  180 capsule  3  . pioglitazone (ACTOS) 30 MG tablet Take 1 tablet (30 mg total) by mouth daily.  90 tablet  3  . Probiotic Product (PHILLIPS COLON HEALTH PO) Take 1,000 mg by mouth every evening.      . Pyridoxine HCl (VITAMIN B-6 PO) Take by mouth daily.      . rosuvastatin (CRESTOR) 20 MG tablet Take 20 mg by mouth daily.      . sertraline (ZOLOFT) 50 MG tablet TAKE 1 TABLET ONCE DAILY  90 tablet  3  . valsartan (DIOVAN) 320 MG tablet Take 1 tablet (320 mg total) by mouth daily.  90 tablet  3   No current facility-administered medications for this visit.    REVIEW OF SYSTEMS:   Constitutional: Denies fevers, chills  Eyes: Denies blurriness of vision, double vision or watery eyes Ears, nose, mouth, throat, and face: Denies mucositis or sore  throat Respiratory: Denies cough, dyspnea or wheezes Cardiovascular: Denies palpitation, chest discomfort or lower extremity swelling Gastrointestinal:  Denies nausea, heartburn or change in bowel habits Skin: Denies abnormal skin rashes Lymphatics: Denies new lymphadenopathy or easy bruising Neurological:Denies numbness, tingling or new weaknesses Behavioral/Psych: Mood is stable, no new changes  All other systems were reviewed with the patient  and are negative.  PHYSICAL EXAMINATION: ECOG PERFORMANCE STATUS: 1 - Symptomatic but completely ambulatory  Filed Vitals:   01/07/14 1409  BP: 140/70  Pulse: 99  Temp: 97 F (36.1 C)  Resp: 18   Filed Weights   01/07/14 1409  Weight: 160 lb (72.576 kg)    GENERAL:alert, no distress and comfortable. She is obese SKIN: skin color, texture, turgor are normal, no rashes or significant lesions EYES: normal, conjunctiva are pink and non-injected, sclera clear OROPHARYNX:no exudate, no erythema and lips, buccal mucosa, and tongue normal  NECK: supple, thyroid normal size, non-tender, without nodularity LYMPH:  no palpable lymphadenopathy in the cervical, axillary or inguinal LUNGS: clear to auscultation and percussion with normal breathing effort HEART: regular rate & rhythm and no murmurs and no lower extremity edema ABDOMEN:abdomen soft, non-tender and normal bowel sounds well-healed surgical scars Musculoskeletal:no cyanosis of digits and no clubbing  PSYCH: alert & oriented x 3 with fluent speech NEURO: no focal motor/sensory deficits  LABORATORY DATA:  I have reviewed the data as listed Recent Results (from the past 2160 hour(s))  HM MAMMOGRAPHY     Status: None   Collection Time    11/05/13 12:00 AM      Result Value Ref Range   HM Mammogram normal    HM PAP SMEAR     Status: None   Collection Time    11/05/13 12:00 AM      Result Value Ref Range   HM Pap smear done by GYN    LIPASE     Status: None   Collection Time     12/28/13 10:33 AM      Result Value Ref Range   Lipase 20.0  11.0 - 59.0 U/L  AMYLASE     Status: Abnormal   Collection Time    12/28/13 10:33 AM      Result Value Ref Range   Amylase 9 (*) 27 - 131 U/L  CBC WITH DIFFERENTIAL     Status: Abnormal   Collection Time    12/28/13 10:33 AM      Result Value Ref Range   WBC 51.2 Repeated and verified X2. (*) 4.0 - 10.5 K/uL   RBC 4.41  3.87 - 5.11 Mil/uL   Hemoglobin 12.5  12.0 - 15.0 g/dL   HCT 38.4  36.0 - 46.0 %   MCV 87.0  78.0 - 100.0 fl   MCHC 32.5  30.0 - 36.0 g/dL   RDW 15.5  11.5 - 15.5 %   Platelets 541.0 (*) 150.0 - 400.0 K/uL   Neutrophils Relative %   (*) 43.0 - 77.0 %   Value: 89.2 abnormal cells  noted on smear referred for pathology review.   Lymphocytes Relative 8.0 (*) 12.0 - 46.0 %   Monocytes Relative 2.7 (*) 3.0 - 12.0 %   Eosinophils Relative 0.0  0.0 - 5.0 %   Basophils Relative 0.1  0.0 - 3.0 %   Neutro Abs 45.6 (*) 1.4 - 7.7 K/uL   Lymphs Abs 4.1 (*) 0.7 - 4.0 K/uL   Monocytes Absolute 1.4 (*) 0.1 - 1.0 K/uL   Eosinophils Absolute 0.0  0.0 - 0.7 K/uL   Basophils Absolute 0.1  0.0 - 0.1 K/uL  COMPREHENSIVE METABOLIC PANEL     Status: Abnormal   Collection Time    12/28/13 10:33 AM      Result Value Ref Range   Sodium 141  135 - 145 mEq/L   Potassium 4.3  3.5 -  5.1 mEq/L   Chloride 104  96 - 112 mEq/L   CO2 25  19 - 32 mEq/L   Glucose, Bld 221 (*) 70 - 99 mg/dL   BUN 21  6 - 23 mg/dL   Creatinine, Ser 0.9  0.4 - 1.2 mg/dL   Total Bilirubin 0.5  0.2 - 1.2 mg/dL   Alkaline Phosphatase 74  39 - 117 U/L   AST 16  0 - 37 U/L   ALT 19  0 - 35 U/L   Total Protein 7.9  6.0 - 8.3 g/dL   Albumin 4.0  3.5 - 5.2 g/dL   Calcium 9.8  8.4 - 10.5 mg/dL   GFR 63.80  >60.00 mL/min  HEMOGLOBIN A1C     Status: Abnormal   Collection Time    12/28/13 10:33 AM      Result Value Ref Range   Hemoglobin A1C 9.0 (*) 4.6 - 6.5 %   Comment: Glycemic Control Guidelines for People with Diabetes:Non Diabetic:  <6%Goal of  Therapy: <7%Additional Action Suggested:  >8%   URINALYSIS, ROUTINE W REFLEX MICROSCOPIC     Status: Abnormal   Collection Time    12/28/13 10:33 AM      Result Value Ref Range   Color, Urine YELLOW  Yellow;Lt. Yellow   APPearance CLEAR  Clear   Specific Gravity, Urine 1.015  1.000-1.030   pH 6.0  5.0 - 8.0   Total Protein, Urine TRACE (*) Negative   Urine Glucose >=1000 (*) Negative   Ketones, ur NEGATIVE  Negative   Bilirubin Urine NEGATIVE  Negative   Hgb urine dipstick SMALL (*) Negative   Urobilinogen, UA 0.2  0.0 - 1.0   Leukocytes, UA TRACE (*) Negative   Nitrite NEGATIVE  Negative   WBC, UA 11-20/hpf (*) 0-2/hpf   RBC / HPF 3-6/hpf (*) 0-2/hpf   Squamous Epithelial / LPF Rare(0-4/hpf)  Rare(0-4/hpf)   Bacteria, UA Rare(<10/hpf) (*) None  SPEP & IFE WITH QIG     Status: Abnormal   Collection Time    12/28/13 10:33 AM      Result Value Ref Range   IgG (Immunoglobin G), Serum 655 (*) 690 - 1700 mg/dL   IgA 182  69 - 380 mg/dL   IgM, Serum 236  52 - 322 mg/dL   Immunofix Electr Int SEE NOTE     Comment: No monoclonal protein identified.     Reviewed by Odis Hollingshead, MD, PhD, FCAP (Electronic Signature on     File)   Total Protein, Serum Electrophoresis 7.3  6.0 - 8.3 g/dL   Albumin ELP 53.8 (*) 55.8 - 66.1 %   Alpha-1-Globulin 8.8 (*) 2.9 - 4.9 %   Alpha-2-Globulin 14.4 (*) 7.1 - 11.8 %   Beta Globulin 7.4 (*) 4.7 - 7.2 %   Beta 2 5.7  3.2 - 6.5 %   Gamma Globulin 9.9 (*) 11.1 - 18.8 %   M-Spike, % NOT DET     SPE Interp. SEE NOTE     Comment: The possibility of a faint restricted band(s) cannot be completely     excluded in the gamma region.     Results are consistent with SPE performed on 04/14/2009  Reviewed by     Odis Hollingshead, MD, PhD, FCAP (Electronic Signature on File)   COMMENT (PROTEIN ELECTROPHOR) SEE NOTE     Comment: ---------------     Serum protein electrophoresis is a useful screening procedure in the     detection  of various  pathophysiologic states such as inflammation,     gammopathies, protein loss and other dysproteinemias.  Immunofixation     electrophoresis (IFE) is a more sensitive technique for the     identification of M-proteins found in patients with monoclonal     gammopathy of unknown significance (MGUS), amyloidosis, early or     treated myeloma or macroglobulinemia, solitary plasmacytoma or     extramedullary plasmacytoma.  PATHOLOGIST SMEAR REVIEW     Status: None   Collection Time    12/28/13 12:30 PM      Result Value Ref Range   Path Review SEE NOTE     Comment: Leukocytosis due to absolute granulocytosis. Myeloid population     consists predominantly of mature segmented neutrophils.  No immature     cells are identified.    The overall findings in this smear are     consistent with a reactive thrombocytosis.  Clinical correlation is     recommended.  RBCs demonstrate rare elliptocytes.     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 She had extensive workup 5 years ago which exclude a myeloproliferative disorder. I suspect the cause of her significant elevated white blood cell count recently could be related to stress and dehydration. I recommend we check her blood test tomorrow. If she continues to have persistent leukocytosis, I will order her additional workup to exclude lymphoma or leukemia.  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 The patient has a high baseline since a splenectomy. She is not at risk of clotting. I recommend she continues to take aspirin daily.  S/P splenectomy Her preventative vaccination programs are up-to-date.  Type II or unspecified type diabetes mellitus without mention of complication, uncontrolled She has poorly controlled diabetes with new onset of mild peripheral  neuropathy. I recommend she consult with her primary care provider for further management and I recommend significant dietary modification.  Abnormal weight loss This could be related to poorly controlled diabetes. If additional tests could not explain the cause of her abnormal CBC, I recommend CT imaging study to exclude malignancy. Her last colonoscopy 5 years ago within normal limits.  Chronic night sweats The cause is unknown. As mentioned above, if additional workup with blood work was not revealing, she will need imaging study to exclude malignancy.

## 2014-01-07 NOTE — Assessment & Plan Note (Signed)
This could be related to poorly controlled diabetes. If additional tests could not explain the cause of her abnormal CBC, I recommend CT imaging study to exclude malignancy. Her last colonoscopy 5 years ago within normal limits.

## 2014-01-07 NOTE — Assessment & Plan Note (Signed)
Her preventative vaccination programs are up-to-date.

## 2014-01-07 NOTE — Assessment & Plan Note (Signed)
She has poorly controlled diabetes with new onset of mild peripheral neuropathy. I recommend she consult with her primary care provider for further management and I recommend significant dietary modification.

## 2014-01-07 NOTE — Progress Notes (Signed)
Checked in new pt with no financial concerns. °

## 2014-01-07 NOTE — Assessment & Plan Note (Signed)
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 She had extensive workup 5 years ago which exclude a myeloproliferative disorder. I suspect the cause of her significant elevated white blood cell count recently could be related to stress and dehydration. I recommend we check her blood test tomorrow. If she continues to have persistent leukocytosis, I will order her additional workup to exclude lymphoma or leukemia.

## 2014-01-07 NOTE — Telephone Encounter (Signed)
Pt confirmed labs/ov per 07/16 POF, gave pt AVS......KJ °

## 2014-01-07 NOTE — Assessment & Plan Note (Signed)
Her thrombocytosis likely due to reactive process from stress. The patient has a high baseline since a splenectomy. She is not at risk of clotting. I recommend she continues to take aspirin daily.

## 2014-01-08 ENCOUNTER — Other Ambulatory Visit (HOSPITAL_BASED_OUTPATIENT_CLINIC_OR_DEPARTMENT_OTHER): Payer: BC Managed Care – PPO

## 2014-01-08 ENCOUNTER — Other Ambulatory Visit (HOSPITAL_COMMUNITY)
Admission: RE | Admit: 2014-01-08 | Discharge: 2014-01-08 | Disposition: A | Discharge status: Home / Self Care | Payer: BC Managed Care – PPO | Source: Ambulatory Visit | Attending: Hematology and Oncology | Admitting: Hematology and Oncology

## 2014-01-08 DIAGNOSIS — Z9081 Acquired absence of spleen: Secondary | ICD-10-CM

## 2014-01-08 DIAGNOSIS — D72829 Elevated white blood cell count, unspecified: Secondary | ICD-10-CM | POA: Insufficient documentation

## 2014-01-08 DIAGNOSIS — D473 Essential (hemorrhagic) thrombocythemia: Secondary | ICD-10-CM | POA: Insufficient documentation

## 2014-01-08 DIAGNOSIS — R7989 Other specified abnormal findings of blood chemistry: Secondary | ICD-10-CM

## 2014-01-08 DIAGNOSIS — Z9089 Acquired absence of other organs: Secondary | ICD-10-CM | POA: Insufficient documentation

## 2014-01-08 DIAGNOSIS — D709 Neutropenia, unspecified: Secondary | ICD-10-CM

## 2014-01-08 LAB — CBC WITH DIFFERENTIAL/PLATELET
BASO%: 0.2 % (ref 0.0–2.0)
Basophils Absolute: 0 10*3/uL (ref 0.0–0.1)
EOS%: 1.8 % (ref 0.0–7.0)
Eosinophils Absolute: 0.3 10*3/uL (ref 0.0–0.5)
HCT: 36.1 % (ref 34.8–46.6)
HGB: 11.5 g/dL — ABNORMAL LOW (ref 11.6–15.9)
LYMPH%: 41.4 % (ref 14.0–49.7)
MCH: 27.8 pg (ref 25.1–34.0)
MCHC: 31.9 g/dL (ref 31.5–36.0)
MCV: 87.2 fL (ref 79.5–101.0)
MONO#: 1.4 10*3/uL — AB (ref 0.1–0.9)
MONO%: 7.9 % (ref 0.0–14.0)
NEUT#: 8.7 10*3/uL — ABNORMAL HIGH (ref 1.5–6.5)
NEUT%: 48.7 % (ref 38.4–76.8)
Platelets: 692 10*3/uL — ABNORMAL HIGH (ref 145–400)
RBC: 4.14 10*6/uL (ref 3.70–5.45)
RDW: 15.5 % — ABNORMAL HIGH (ref 11.2–14.5)
WBC: 17.9 10*3/uL — AB (ref 3.9–10.3)
lymph#: 7.4 10*3/uL — ABNORMAL HIGH (ref 0.9–3.3)

## 2014-01-08 LAB — MORPHOLOGY
PLT EST: INCREASED
RBC COMMENTS: NORMAL

## 2014-01-15 LAB — FLOW CYTOMETRY

## 2014-01-18 ENCOUNTER — Encounter: Payer: Self-pay | Admitting: Internal Medicine

## 2014-01-18 ENCOUNTER — Ambulatory Visit (INDEPENDENT_AMBULATORY_CARE_PROVIDER_SITE_OTHER): Payer: BC Managed Care – PPO | Admitting: Internal Medicine

## 2014-01-18 VITALS — BP 130/84 | HR 82 | Temp 98.0°F | Resp 16 | Ht 63.0 in | Wt 160.0 lb

## 2014-01-18 DIAGNOSIS — I10 Essential (primary) hypertension: Secondary | ICD-10-CM

## 2014-01-18 DIAGNOSIS — IMO0002 Reserved for concepts with insufficient information to code with codable children: Secondary | ICD-10-CM

## 2014-01-18 DIAGNOSIS — D72829 Elevated white blood cell count, unspecified: Secondary | ICD-10-CM

## 2014-01-18 DIAGNOSIS — M5416 Radiculopathy, lumbar region: Secondary | ICD-10-CM

## 2014-01-18 MED ORDER — HYDROCODONE-ACETAMINOPHEN 10-325 MG PO TABS: 1.0000 | ORAL_TABLET | Freq: Three times a day (TID) | ORAL | Status: DC | PRN

## 2014-01-18 NOTE — Assessment & Plan Note (Signed)
Will follow for now

## 2014-01-18 NOTE — Assessment & Plan Note (Signed)
Her BP is well controlled 

## 2014-01-18 NOTE — Progress Notes (Signed)
Pre visit review using our clinic review tool, if applicable. No additional management support is needed unless otherwise documented below in the visit note. 

## 2014-01-18 NOTE — Patient Instructions (Signed)

## 2014-01-18 NOTE — Progress Notes (Signed)
Subjective:    Patient ID: Margaret Norman, female    DOB: [birth year], 63 y.o.   MRN: [id number]  Arthritis Presents for follow-up visit. The disease course has been stable. She complains of pain. She reports no stiffness, joint swelling or joint warmth. Affected locations include the left knee and right knee. Her pain is at a severity of 3/10. Associated symptoms include pain at night and pain while resting. Pertinent negatives include no diarrhea, dry eyes, dry mouth, dysuria, fatigue, fever, rash, Raynaud's syndrome, uveitis or weight loss. Her past medical history is significant for osteoarthritis. Her pertinent risk factors include overuse. Risk factors do not include scoliosis. Past treatments include acetaminophen and an opioid. The treatment provided moderate relief. Factors aggravating her arthritis include activity. Compliance with prior treatments has been good. Compliance with total regimen is 76-100%.      Review of Systems  Constitutional: Negative.  Negative for fever, chills, weight loss, diaphoresis, activity change, appetite change, fatigue and unexpected weight change.  HENT: Negative.   Eyes: Negative.   Respiratory: Negative.  Negative for cough, choking, chest tightness, shortness of breath and stridor.   Cardiovascular: Negative.  Negative for chest pain, palpitations and leg swelling.  Gastrointestinal: Negative.  Negative for nausea, vomiting, abdominal pain, diarrhea and constipation.  Endocrine: Negative.   Genitourinary: Negative.  Negative for dysuria.  Musculoskeletal: Positive for arthralgias, arthritis and back pain. Negative for gait problem, joint swelling, myalgias, neck pain, neck stiffness and stiffness.  Skin: Negative.  Negative for pallor and rash.  Allergic/Immunologic: Negative.   Neurological: Negative.   Hematological: Negative.  Negative for adenopathy. Does not bruise/bleed easily.  Psychiatric/Behavioral: Negative.        Objective:   Physical Exam  Vitals reviewed. Constitutional: She is oriented to person, place, and time. She appears well-developed and well-nourished. No distress.  HENT:  Head: Normocephalic and atraumatic.  Mouth/Throat: Oropharynx is clear and moist. No oropharyngeal exudate.  Eyes: Conjunctivae are normal. Right eye exhibits no discharge. Left eye exhibits no discharge. No scleral icterus.  Neck: Normal range of motion. Neck supple. No JVD present. No tracheal deviation present. No thyromegaly present.  Cardiovascular: Normal rate, regular rhythm, normal heart sounds and intact distal pulses.  Exam reveals no gallop and no friction rub.   No murmur heard. Pulmonary/Chest: Effort normal and breath sounds normal. No stridor. No respiratory distress. She has no wheezes. She has no rales. She exhibits no tenderness.  Abdominal: Soft. Bowel sounds are normal. She exhibits no distension and no mass. There is no tenderness. There is no rebound and no guarding.  Musculoskeletal: Normal range of motion. She exhibits no edema and no tenderness.  Lymphadenopathy:    She has no cervical adenopathy.  Neurological: She is oriented to person, place, and time.  Skin: Skin is warm and dry. No rash noted. She is not diaphoretic. No erythema. No pallor.  Psychiatric: She has a normal mood and affect. Her behavior is normal. Judgment and thought content normal.    Lab Results  Component Value Date   WBC 17.9* 01/08/2014   HGB 11.5* 01/08/2014   HCT 36.1 01/08/2014   PLT 692* 01/08/2014   GLUCOSE 221* 12/28/2013   CHOL 218* 08/25/2013   TRIG 216.0* 08/25/2013   HDL 50.10 08/25/2013   LDLDIRECT 137.9 02/04/2013   LDLCALC 125* 08/25/2013   ALT 19 12/28/2013   AST 16 12/28/2013   NA 141 12/28/2013   K 4.3 12/28/2013   CL 104 12/28/2013  CREATININE 0.9 12/28/2013   BUN 21 12/28/2013   CO2 25 12/28/2013   TSH 1.44 02/04/2013   HGBA1C 9.0* 12/28/2013   MICROALBUR 0.5 11/15/2008        Assessment & Plan:

## 2014-01-21 ENCOUNTER — Emergency Department (HOSPITAL_COMMUNITY): Payer: BC Managed Care – PPO

## 2014-01-21 ENCOUNTER — Encounter (HOSPITAL_COMMUNITY): Payer: Self-pay | Admitting: Emergency Medicine

## 2014-01-21 ENCOUNTER — Emergency Department (HOSPITAL_COMMUNITY)
Admission: EM | Admit: 2014-01-21 | Discharge: 2014-01-21 | Disposition: A | Discharge status: Home / Self Care | Payer: BC Managed Care – PPO | Attending: Emergency Medicine | Admitting: Emergency Medicine

## 2014-01-21 DIAGNOSIS — N289 Disorder of kidney and ureter, unspecified: Secondary | ICD-10-CM | POA: Insufficient documentation

## 2014-01-21 DIAGNOSIS — F3289 Other specified depressive episodes: Secondary | ICD-10-CM | POA: Insufficient documentation

## 2014-01-21 DIAGNOSIS — Z7982 Long term (current) use of aspirin: Secondary | ICD-10-CM | POA: Insufficient documentation

## 2014-01-21 DIAGNOSIS — N390 Urinary tract infection, site not specified: Secondary | ICD-10-CM | POA: Insufficient documentation

## 2014-01-21 DIAGNOSIS — Z9889 Other specified postprocedural states: Secondary | ICD-10-CM | POA: Insufficient documentation

## 2014-01-21 DIAGNOSIS — N2889 Other specified disorders of kidney and ureter: Secondary | ICD-10-CM

## 2014-01-21 DIAGNOSIS — I10 Essential (primary) hypertension: Secondary | ICD-10-CM | POA: Insufficient documentation

## 2014-01-21 DIAGNOSIS — Z9089 Acquired absence of other organs: Secondary | ICD-10-CM | POA: Insufficient documentation

## 2014-01-21 DIAGNOSIS — Z880 Allergy status to penicillin: Secondary | ICD-10-CM | POA: Insufficient documentation

## 2014-01-21 DIAGNOSIS — Z794 Long term (current) use of insulin: Secondary | ICD-10-CM | POA: Insufficient documentation

## 2014-01-21 DIAGNOSIS — Z79899 Other long term (current) drug therapy: Secondary | ICD-10-CM | POA: Insufficient documentation

## 2014-01-21 DIAGNOSIS — R1031 Right lower quadrant pain: Secondary | ICD-10-CM | POA: Insufficient documentation

## 2014-01-21 DIAGNOSIS — E119 Type 2 diabetes mellitus without complications: Secondary | ICD-10-CM | POA: Insufficient documentation

## 2014-01-21 DIAGNOSIS — F329 Major depressive disorder, single episode, unspecified: Secondary | ICD-10-CM | POA: Insufficient documentation

## 2014-01-21 DIAGNOSIS — K219 Gastro-esophageal reflux disease without esophagitis: Secondary | ICD-10-CM | POA: Insufficient documentation

## 2014-01-21 LAB — URINE MICROSCOPIC-ADD ON

## 2014-01-21 LAB — CBC WITH DIFFERENTIAL/PLATELET
BASOS ABS: 0 10*3/uL (ref 0.0–0.1)
Basophils Relative: 0 % (ref 0–1)
Eosinophils Absolute: 0.2 10*3/uL (ref 0.0–0.7)
Eosinophils Relative: 2 % (ref 0–5)
HCT: 41.8 % (ref 36.0–46.0)
Hemoglobin: 13.1 g/dL (ref 12.0–15.0)
LYMPHS PCT: 34 % (ref 12–46)
Lymphs Abs: 5.3 10*3/uL — ABNORMAL HIGH (ref 0.7–4.0)
MCH: 28.1 pg (ref 26.0–34.0)
MCHC: 31.3 g/dL (ref 30.0–36.0)
MCV: 89.5 fL (ref 78.0–100.0)
MONO ABS: 1.3 10*3/uL — AB (ref 0.1–1.0)
Monocytes Relative: 8 % (ref 3–12)
Neutro Abs: 8.7 10*3/uL — ABNORMAL HIGH (ref 1.7–7.7)
Neutrophils Relative %: 56 % (ref 43–77)
Platelets: 520 10*3/uL — ABNORMAL HIGH (ref 150–400)
RBC: 4.67 MIL/uL (ref 3.87–5.11)
RDW: 16.1 % — AB (ref 11.5–15.5)
WBC: 15.5 10*3/uL — AB (ref 4.0–10.5)

## 2014-01-21 LAB — LIPASE, BLOOD: Lipase: 8 U/L — ABNORMAL LOW (ref 11–59)

## 2014-01-21 LAB — COMPREHENSIVE METABOLIC PANEL
ALT: 16 U/L (ref 0–35)
ANION GAP: 18 — AB (ref 5–15)
AST: 19 U/L (ref 0–37)
Albumin: 4.3 g/dL (ref 3.5–5.2)
Alkaline Phosphatase: 77 U/L (ref 39–117)
BUN: 13 mg/dL (ref 6–23)
CALCIUM: 10.2 mg/dL (ref 8.4–10.5)
CO2: 22 mEq/L (ref 19–32)
CREATININE: 0.67 mg/dL (ref 0.50–1.10)
Chloride: 103 mEq/L (ref 96–112)
GLUCOSE: 172 mg/dL — AB (ref 70–99)
Potassium: 4.5 mEq/L (ref 3.7–5.3)
Sodium: 143 mEq/L (ref 137–147)
Total Bilirubin: 0.2 mg/dL — ABNORMAL LOW (ref 0.3–1.2)
Total Protein: 7.5 g/dL (ref 6.0–8.3)

## 2014-01-21 LAB — URINALYSIS, ROUTINE W REFLEX MICROSCOPIC
Bilirubin Urine: NEGATIVE
Glucose, UA: >1000 mg/dL — AB
Hgb urine dipstick: NEGATIVE
KETONES UR: NEGATIVE mg/dL
Leukocytes, UA: NEGATIVE
NITRITE: POSITIVE — AB
Protein, ur: NEGATIVE mg/dL
Specific Gravity, Urine: 1.045 — ABNORMAL HIGH (ref 1.005–1.030)
Urobilinogen, UA: 0.2 mg/dL (ref 0.0–1.0)
pH: 5.5 (ref 5.0–8.0)

## 2014-01-21 MED ORDER — HYDROMORPHONE HCL PF 1 MG/ML IJ SOLN
0.5000 mg | INTRAMUSCULAR | Status: DC | PRN
  Administered 2014-01-21 (×2): 0.5 mg via INTRAVENOUS
  Filled 2014-01-21 (×2): qty 1

## 2014-01-21 MED ORDER — ONDANSETRON HCL 4 MG/2ML IJ SOLN
4.0000 mg | Freq: Once | INTRAMUSCULAR | Status: AC
  Administered 2014-01-21: 4 mg via INTRAVENOUS
  Filled 2014-01-21: qty 2

## 2014-01-21 MED ORDER — IOHEXOL 300 MG/ML  SOLN
100.0000 mL | Freq: Once | INTRAMUSCULAR | Status: AC | PRN
  Administered 2014-01-21: 100 mL via INTRAVENOUS

## 2014-01-21 MED ORDER — IOHEXOL 300 MG/ML  SOLN
25.0000 mL | INTRAMUSCULAR | Status: AC
  Administered 2014-01-21: 25 mL via ORAL

## 2014-01-21 MED ORDER — FLUCONAZOLE 150 MG PO TABS: 150.0000 mg | ORAL_TABLET | Freq: Every day | ORAL | Status: AC

## 2014-01-21 MED ORDER — CIPROFLOXACIN IN D5W 400 MG/200ML IV SOLN
400.0000 mg | Freq: Once | INTRAVENOUS | Status: AC
  Administered 2014-01-21: 400 mg via INTRAVENOUS
  Filled 2014-01-21: qty 200

## 2014-01-21 MED ORDER — SODIUM CHLORIDE 0.9 % IV SOLN
1000.0000 mL | Freq: Once | INTRAVENOUS | Status: AC
  Administered 2014-01-21: 1000 mL via INTRAVENOUS

## 2014-01-21 MED ORDER — HYDROCODONE-ACETAMINOPHEN 5-325 MG PO TABS: 1.0000 | ORAL_TABLET | ORAL | Status: DC | PRN

## 2014-01-21 MED ORDER — CIPROFLOXACIN HCL 500 MG PO TABS: 500.0000 mg | ORAL_TABLET | Freq: Two times a day (BID) | ORAL | Status: DC

## 2014-01-21 MED ORDER — SODIUM CHLORIDE 0.9 % IV SOLN
1000.0000 mL | INTRAVENOUS | Status: DC
  Administered 2014-01-21: 1000 mL via INTRAVENOUS

## 2014-01-21 NOTE — ED Notes (Signed)
Brought pt back to room with family in tow; pt getting undressed and into a gown at this time; family at bedside; Peri Jefferson, RN aware of pt

## 2014-01-21 NOTE — ED Notes (Signed)
CT called to inform that patient is finished with oral contrast and ready for exam

## 2014-01-21 NOTE — ED Notes (Signed)
Discharge instructions reviewed with pt. Pt verbalized understanding.   

## 2014-01-21 NOTE — ED Provider Notes (Signed)
CSN: [id number]     Arrival date & time 01/21/14  1305 History   First MD Initiated Contact with Patient 01/21/14 1426     Chief Complaint  Patient presents with  . Abdominal Pain    Patient is a 63 y.o. female presenting with abdominal pain. The history is provided by the patient.  Abdominal Pain Pain location:  RLQ Pain quality: sharp   Pain radiates to:  R flank Pain severity:  Moderate Onset quality:  Sudden Duration: Noticed it when waking up this am. Timing:  Constant Associated symptoms: no cough, no diarrhea, no dysuria, no fever, no nausea and no vomiting   Pt does have some trouble with recurrent abdominal pain but this feels different.  Past Medical History  Diagnosis Date  . Type II or unspecified type diabetes mellitus without mention of complication, not stated as uncontrolled   . Depression   . GERD (gastroesophageal reflux disease)   . Hyperlipidemia   . Nephrolithiasis   . HTN (hypertension)   . Gallstones 11-06  . Chest pain     Nuclear, adenosine,  December, 2013, low risk nuclear scan with small, moderate in intensity, fixed anteroseptal defect. This is possibly related to an LBBB versus small prior infarct. No ischemia  . LBBB (left bundle branch block)   . 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 Tonsillectomy and adenoidectomy    . Cesarean section      x2 ? w/appy  . Knee surgery  11/2005  . Splenectomy    . Esophagogastroduodenoscopy    . Hernia repair    . Pancreatectomy    . Cholecystectomy    . Liver biopsy     Family History  Problem Relation Age of Onset  . Stroke Maternal Grandfather   . Coronary artery disease Other   . Skin cancer Other   . Heart attack Other   . Hypertension Other   . Hyperlipidemia Other   . Diabetes Other   . Cancer Other     skin  . Liver disease Mother   . Dementia Mother   . Heart disease Father   . Cancer Paternal Uncle     unknown  . Cancer  Other     leukemia   History  Substance Use Topics  . Smoking status: Never Smoker   . Smokeless tobacco: Never Used  . Alcohol Use: No   OB History   Grav Para Term Preterm Abortions TAB SAB Ect Mult Living                 Review of Systems  Constitutional: Negative for fever.  Respiratory: Negative for cough.   Gastrointestinal: Positive for abdominal pain. Negative for nausea, vomiting and diarrhea.  Genitourinary: Negative for dysuria.      Allergies  Benicar and Amoxicillin  Home Medications   Prior to Admission medications   Medication Sig Start Date End Date Taking? Authorizing Provider  aspirin 81 MG EC tablet Take 81 mg by mouth daily.     Yes Historical Provider, MD  buPROPion (WELLBUTRIN XL) 150 MG 24 hr tablet Take 150 mg by mouth every morning. 03/16/13  Yes Janith Lima, MD  calcium carbonate (TUMS - DOSED IN MG ELEMENTAL CALCIUM) 500 MG chewable tablet Chew 1 tablet by mouth 2 (two) times daily as needed for indigestion or heartburn.   Yes Historical Provider, MD  Canagliflozin (INVOKANA) 300 MG TABS Take 1 tablet by mouth daily.  11/03/12  Yes Janith Lima, MD  Cyanocobalamin (VITAMIN B12 PO) Take 2,500 mcg by mouth daily.   Yes Historical Provider, MD  docusate sodium (COLACE) 100 MG capsule Take 100 mg by mouth daily.   Yes Historical Provider, MD  Dulaglutide (TRULICITY) 1.5 LN/9.8XQ SOPN Inject 1 Act into the skin once a week. 10/20/13  Yes Janith Lima, MD  HYDROcodone-acetaminophen (NORCO) 10-325 MG per tablet Take 1 tablet by mouth every 8 (eight) hours as needed. 01/18/14  Yes Janith Lima, MD  insulin glargine (LANTUS) 100 UNIT/ML injection Inject 30 Units into the skin at bedtime.   Yes Historical Provider, MD  metFORMIN (GLUCOPHAGE) 1000 MG tablet Take 1,000 mg by mouth 2 (two) times daily with a meal.   Yes Historical Provider, MD  Multiple Vitamin (MULTIVITAMIN) capsule Take 1 capsule by mouth daily.   Yes Historical Provider, MD  naproxen  sodium (ANAPROX) 220 MG tablet Take 220 mg by mouth 2 (two) times daily as needed. Pain.   Yes Historical Provider, MD  omeprazole (PRILOSEC) 20 MG capsule Take 40 mg by mouth daily.   Yes Historical Provider, MD  pioglitazone (ACTOS) 30 MG tablet Take 1 tablet (30 mg total) by mouth daily. 05/18/13  Yes Janith Lima, MD  Probiotic Product ([place] PO) Take 1,000 mg by mouth every evening.   Yes Historical Provider, MD  Pyridoxine HCl (VITAMIN B-6 PO) Take 1 tablet by mouth daily.    Yes Historical Provider, MD  sertraline (ZOLOFT) 50 MG tablet Take 50 mg by mouth daily.   Yes Historical Provider, MD  valsartan (DIOVAN) 320 MG tablet Take 1 tablet (320 mg total) by mouth daily. 06/01/13 06/01/14 Yes Janith Lima, MD  B-D ULTRAFINE III SHORT PEN 31G X 8 MM MISC USE DAILY WITH LANTUS INSULIN 03/23/13   Janith Lima, MD  ciprofloxacin (CIPRO) 500 MG tablet Take 1 tablet (500 mg total) by mouth 2 (two) times daily. 01/21/14   Dorie Rank, MD  Doxepin HCl (SILENOR) 6 MG TABS Take 6 mg by mouth at bedtime as needed. For sleep    Historical Provider, MD  fluconazole (DIFLUCAN) 150 MG tablet Take 1 tablet (150 mg total) by mouth daily. 01/21/14 01/28/14  Dorie Rank, MD  glucose blood (FREESTYLE LITE) test strip Test three times daily as directed 08/07/11   Janith Lima, MD  HYDROcodone-acetaminophen (NORCO/VICODIN) 5-325 MG per tablet Take 1-2 tablets by mouth every 4 (four) hours as needed. 01/21/14   Dorie Rank, MD  Lancets (FREESTYLE) lancets Test three times daily as directed 08/07/11   Janith Lima, MD   BP 130/59  Pulse 80  Temp(Src) 98.2 F (36.8 C) (Oral)  Resp 14  SpO2 95% Physical Exam  Nursing note and vitals reviewed. Constitutional: She appears well-developed and well-nourished. No distress.  HENT:  Head: Normocephalic and atraumatic.  Right Ear: External ear normal.  Left Ear: External ear normal.  Eyes: Conjunctivae are normal. Right eye exhibits no discharge. Left eye  exhibits no discharge. No scleral icterus.  Neck: Neck supple. No tracheal deviation present.  Cardiovascular: Normal rate, regular rhythm and intact distal pulses.   Pulmonary/Chest: Effort normal and breath sounds normal. No stridor. No respiratory distress. She has no wheezes. She has no rales.  Abdominal: Soft. Bowel sounds are normal. She exhibits no distension. There is generalized tenderness. There is no rebound and no guarding.  Musculoskeletal: She exhibits no edema and no tenderness.  Neurological: She is alert.  She has normal strength. No cranial nerve deficit (no facial droop, extraocular movements intact, no slurred speech) or sensory deficit. She exhibits normal muscle tone. She displays no seizure activity. Coordination normal.  Skin: Skin is warm and dry. No rash noted.  Psychiatric: She has a normal mood and affect.    ED Course  Procedures (including critical care time) Labs Review Labs Reviewed  CBC WITH DIFFERENTIAL - Abnormal; Notable for the following:    WBC 15.5 (*)    RDW 16.1 (*)    Platelets 520 (*)    Neutro Abs 8.7 (*)    Lymphs Abs 5.3 (*)    Monocytes Absolute 1.3 (*)    All other components within normal limits  COMPREHENSIVE METABOLIC PANEL - Abnormal; Notable for the following:    Glucose, Bld 172 (*)    Total Bilirubin 0.2 (*)    Anion gap 18 (*)    All other components within normal limits  LIPASE, BLOOD - Abnormal; Notable for the following:    Lipase 8 (*)    All other components within normal limits  URINALYSIS, ROUTINE W REFLEX MICROSCOPIC - Abnormal; Notable for the following:    Specific Gravity, Urine 1.045 (*)    Glucose, UA >1000 (*)    Nitrite POSITIVE (*)    All other components within normal limits  URINE MICROSCOPIC-ADD ON - Abnormal; Notable for the following:    Squamous Epithelial / LPF FEW (*)    Bacteria, UA FEW (*)    All other components within normal limits  URINE CULTURE    Imaging Review Ct Abdomen Pelvis W  Contrast  01/21/2014   CLINICAL DATA:  Abdominal pain  EXAM: CT ABDOMEN AND PELVIS WITH CONTRAST  TECHNIQUE: Multidetector CT imaging of the abdomen and pelvis was performed using the standard protocol following bolus administration of intravenous contrast. Oral contrast was also administered  CONTRAST:  12mL OMNIPAQUE IOHEXOL 300 MG/ML  SOLN  COMPARISON:  January 09, 2010  FINDINGS: Lung bases are clear.  Liver is prominent, measuring 17.2 cm in length. No focal liver lesions are appreciable. Gallbladder is absent. There is no appreciable biliary duct dilatation.  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 A portion of the head remains as does the uncinate process. There is no pancreatic abnormality in the regions of the pancreas which remain. This appearance is stable compared to the prior study.  Adrenals appear normal. There is a simple cyst in the medial mid right kidney measuring 1.4 by 1.2 cm. There is a simple cyst in the lateral right mid kidney measuring 1 x 1 cm. There is a mass arising from the lateral mid left kidney measuring 1 x 1 cm which has attenuation values consistent with a cyst. There is a mass arising from the upper pole of the left kidney anteriorly measuring 8 x 7 mm which has attenuation values higher than is expected with a simple cyst. There is a mass arising from the lower pole left kidney measuring 1.4 x 1.1 cm which has attenuation values higher than simple cyst which has increased slightly in size compared to the prior study. There is no hydronephrosis on either side. There is no renal or ureteral calculus on either side.  Since the prior study, the patient has undergone ventral hernia repair. There is currently no appreciable ventral hernia.  In the pelvis, the urinary bladder is midline with normal wall thickness. There are sigmoid  diverticula without diverticulitis. There is no pelvic mass or fluid collection. Appendix  appears normal. There is no bowel obstruction. There is no free air or portal venous air. There is moderate stool throughout colon diffusely.  There is no ascites, adenopathy, or abscess in the abdomen or pelvis. There is no evidence of abdominal aortic aneurysm. There is degenerative change in the lumbar spine. There are no blastic or lytic bone lesions.  IMPRESSION: There is a small mass arising from the lower pole left kidney which has attenuation values slightly higher than simple cyst. This mass has become slightly larger compared to the previous study. Given this circumstance, further evaluation is warranted. Further evaluation with pre and post contrast MRI or CT should be considered. MRI is preferred in younger patients (due to lack of ionizing radiation) and for evaluating calcified lesion(s).  Spleen is absent.  Patient has had a partial pancreatectomy, stable.  Liver is prominent but otherwise appears unremarkable.  Sigmoid diverticula without diverticulitis. No bowel obstruction. No abscess. Appendix appears normal. The patient has undergone ventral hernia repair; previously noted ventral hernia is no longer appreciable. There is some scarring in the anterior abdominal wall consistent with previous surgery for this hernia.   Electronically Signed   By: Lowella Grip M.D.   On: 01/21/2014 17:44    Medications  0.9 %  sodium chloride infusion (0 mLs Intravenous Stopped 01/21/14 1700)    Followed by  0.9 %  sodium chloride infusion (0 mLs Intravenous Stopped 01/21/14 1943)  HYDROmorphone (DILAUDID) injection 0.5 mg (0.5 mg Intravenous Given 01/21/14 1829)  ondansetron (ZOFRAN) injection 4 mg (4 mg Intravenous Given 01/21/14 1523)  iohexol (OMNIPAQUE) 300 MG/ML solution 25 mL (25 mLs Oral Contrast Given 01/21/14 1455)  iohexol (OMNIPAQUE) 300 MG/ML solution 100 mL (100 mLs Intravenous Contrast Given 01/21/14 1712)  ciprofloxacin (CIPRO) IVPB 400 mg (0 mg Intravenous Stopped 01/21/14 1943)    MDM    Final diagnoses:  UTI (lower urinary tract infection)  Renal mass    UA is consistent with UTI.  Renal mass noted on CT scan.  Discussed outpatient follow up with the patient.    CT scan otherwise unremarkable.   Will rx abx.  Follow up with PCP  Pt requested something for possible yeast infection when she takes the abx.  Diflucan tablet rx provided    Dorie Rank, MD 01/21/14 1949

## 2014-01-21 NOTE — Discharge Instructions (Signed)

## 2014-01-21 NOTE — ED Notes (Signed)
Pt c/o right LQ pain and some generalized abd pain starting last night

## 2014-01-23 LAB — URINE CULTURE: Colony Count: >=100000

## 2014-01-24 ENCOUNTER — Telehealth (HOSPITAL_BASED_OUTPATIENT_CLINIC_OR_DEPARTMENT_OTHER): Payer: Self-pay | Admitting: Emergency Medicine

## 2014-01-24 NOTE — Telephone Encounter (Signed)
Post ED Visit - Positive Culture Follow-up  Culture report reviewed by antimicrobial stewardship pharmacist: []  Wes Dulaney, Pharm.D., BCPS []  Heide Guile, Pharm.D., BCPS []  Alycia Rossetti, Pharm.D., BCPS [x]  [place], [place].D., BCPS, AAHIVP []  Legrand Como, Pharm.D., BCPS, AAHIVP  Positive urine culture Treated with Cipro, organism sensitive to the same and no further patient follow-up is required at this time.  Matthew Saras, Rex Kras 01/24/2014, 10:28 AM

## 2014-01-28 ENCOUNTER — Encounter: Payer: Self-pay | Admitting: Hematology and Oncology

## 2014-01-28 ENCOUNTER — Ambulatory Visit (HOSPITAL_BASED_OUTPATIENT_CLINIC_OR_DEPARTMENT_OTHER): Payer: BC Managed Care – PPO | Admitting: Hematology and Oncology

## 2014-01-28 ENCOUNTER — Telehealth: Payer: Self-pay | Admitting: Hematology and Oncology

## 2014-01-28 VITALS — BP 146/71 | HR 84 | Temp 98.0°F | Resp 19 | Ht 63.0 in | Wt 160.3 lb

## 2014-01-28 DIAGNOSIS — Z9081 Acquired absence of spleen: Secondary | ICD-10-CM

## 2014-01-28 DIAGNOSIS — D72829 Elevated white blood cell count, unspecified: Secondary | ICD-10-CM

## 2014-01-28 DIAGNOSIS — N289 Disorder of kidney and ureter, unspecified: Secondary | ICD-10-CM

## 2014-01-28 DIAGNOSIS — R7989 Other specified abnormal findings of blood chemistry: Secondary | ICD-10-CM

## 2014-01-28 DIAGNOSIS — N2889 Other specified disorders of kidney and ureter: Secondary | ICD-10-CM

## 2014-01-28 DIAGNOSIS — D75838 Other thrombocytosis: Secondary | ICD-10-CM

## 2014-01-28 DIAGNOSIS — D473 Essential (hemorrhagic) thrombocythemia: Secondary | ICD-10-CM

## 2014-01-28 HISTORY — DX: Other specified disorders of kidney and ureter: N28.89

## 2014-01-28 NOTE — Progress Notes (Signed)
[place] OFFICE PROGRESS NOTE  Scarlette Calico, MD SUMMARY OF HEMATOLOGIC HISTORY: The patient had history of splenectomy 5 years ago. 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 She had extensive workup 5 years ago by another hematologist and overall conclusion was the abnormal CBC was due to her postsplenectomy state. Recently, repeat CBC show significant elevated white count of greater than 50,000 and platelet count greater than 500,000. She denies recent infection. Denies recent prednisone therapy. She complained of recent anorexia, regular in night sweats and weight loss of over 50 pounds in one year. The patient had very poorly controlled diabetes with now new onset of peripheral neuropathy. CT scan of the abdomen and pelvis in July 2015 show no evidence of cancer. INTERVAL HISTORY: JA PISTOLE 63 y.o. female returns for further followup. She went to the emergency department recently and was diagnosed with urinary tract infection. She denies hematuria.  I have reviewed the past medical history, past surgical history, social history and family history with the patient and they are unchanged from previous note.  ALLERGIES:  is allergic to benicar and amoxicillin.  MEDICATIONS:  Current Outpatient Prescriptions  Medication Sig Dispense Refill  . aspirin 81 MG EC tablet Take 81 mg by mouth daily.        . B-D ULTRAFINE III SHORT PEN 31G X 8 MM MISC USE DAILY WITH LANTUS INSULIN  1 each  3  . buPROPion (WELLBUTRIN XL) 150 MG 24 hr tablet Take 150 mg by mouth every morning.  90 tablet  3  . Canagliflozin (INVOKANA) 300 MG TABS Take 1 tablet by mouth daily.  90 tablet  3  . ciprofloxacin (CIPRO) 500 MG tablet Take 1 tablet (500 mg total) by mouth 2 (two) times daily.  20 tablet  0  . Cyanocobalamin (VITAMIN B12 PO) Take 2,500 mcg by mouth daily.      Marland Kitchen docusate sodium (COLACE) 100 MG  capsule Take 100 mg by mouth daily.      . Doxepin HCl (SILENOR) 6 MG TABS Take 6 mg by mouth at bedtime as needed. For sleep      . Dulaglutide (TRULICITY) 1.5 WU/9.8JX SOPN Inject 1 Act into the skin once a week.  12 pen  3  . fluconazole (DIFLUCAN) 150 MG tablet Take 1 tablet (150 mg total) by mouth daily.  1 tablet  0  . glucose blood (FREESTYLE LITE) test strip Test three times daily as directed  300 each  3  . HYDROcodone-acetaminophen (NORCO) 10-325 MG per tablet Take 1 tablet by mouth every 8 (eight) hours as needed.  90 tablet  0  . insulin glargine (LANTUS) 100 UNIT/ML injection Inject 30 Units into the skin at bedtime.      . Lancets (FREESTYLE) lancets Test three times daily as directed  300 each  3  . metFORMIN (GLUCOPHAGE) 1000 MG tablet Take 1,000 mg by mouth 2 (two) times daily with a meal.      . Multiple Vitamin (MULTIVITAMIN) capsule Take 1 capsule by mouth daily.      . naproxen sodium (ANAPROX) 220 MG tablet Take 220 mg by mouth 2 (two) times daily as needed. Pain.      Marland Kitchen omeprazole (PRILOSEC) 20 MG capsule Take 40 mg by mouth daily.      . pioglitazone (ACTOS) 30 MG tablet Take 1 tablet (30 mg total) by mouth daily.  90 tablet  3  . Probiotic Product ([place] [employer] [facility]ALTH PO) Take 1,000 mg by mouth every evening.      . Pyridoxine HCl (VITAMIN B-6 PO) Take 1 tablet by mouth daily.       . sertraline (ZOLOFT) 50 MG tablet Take 50 mg by mouth daily.      . valsartan (DIOVAN) 320 MG tablet Take 1 tablet (320 mg total) by mouth daily.  90 tablet  3   No current facility-administered medications for this visit.     REVIEW OF SYSTEMS:   All other systems were reviewed with the patient and are negative.  PHYSICAL EXAMINATION: ECOG PERFORMANCE STATUS: 1 - Symptomatic but completely ambulatory  Filed Vitals:   01/28/14 1443  BP: 146/71  Pulse: 84  Temp: 98 F (36.7 C)  Resp: 19   Filed Weights   01/28/14 1443  Weight: 160 lb 4.8 oz (72.712 kg)    GENERAL:alert,  no distress and comfortable SKIN: skin color, texture, turgor are normal, no rashes or significant lesions EYES: normal, Conjunctiva are pink and non-injected, sclera clear OROPHARYNX:no exudate, no erythema and lips, buccal mucosa, and tongue normal  NECK: supple, thyroid normal size, non-tender, without nodularity LYMPH:  no palpable lymphadenopathy in the cervical, axillary or inguinal LUNGS: clear to auscultation and percussion with normal breathing effort HEART: regular rate & rhythm and no murmurs and no lower extremity edema ABDOMEN:abdomen soft, non-tender and normal bowel sounds Musculoskeletal:no cyanosis of digits and no clubbing  NEURO: alert & oriented x 3 with fluent speech, no focal motor/sensory deficits  LABORATORY DATA:  I have reviewed the data as listed No results found for this or any previous visit (from the past 48 hour(s)).  Lab Results  Component Value Date   WBC 15.5* 01/21/2014   HGB 13.1 01/21/2014   HCT 41.8 01/21/2014   MCV 89.5 01/21/2014   PLT 520* 01/21/2014    RADIOGRAPHIC STUDIES: I reviewed the most recent CT scan with her and husband. I have personally reviewed the radiological images as listed and agreed with the findings in the report. ASSESSMENT & PLAN:  LEUKOCYTOSIS Her recent workup is negative. I suspect this is due to  post splenectomy state. The patient is reassured.  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 She'll remain on aspirin.  Kidney mass Radiologist commented on abnormal kidney lesion on the left. My review indicated it is on the right. In any case, I recommend consultation with a urologist and she agreed to proceed.    All questions were answered. The patient knows to call the clinic with any problems, questions or concerns. No barriers to learning was detected.  I spent 15 minutes counseling the patient face to face. The total time spent in the appointment was 20 minutes and more than 50% was on  counseling.     [facility], Chastin Garlitz, MD 01/28/2014 8:28 PM

## 2014-01-28 NOTE — Assessment & Plan Note (Signed)
Her recent workup is negative. I suspect this is due to  post splenectomy state. The patient is reassured.

## 2014-01-28 NOTE — Assessment & Plan Note (Signed)
She is not symptomatic. She'll remain on aspirin.

## 2014-01-28 NOTE — Assessment & Plan Note (Signed)
Radiologist commented on abnormal kidney lesion on the left. My review indicated it is on the right. In any case, I recommend consultation with a urologist and she agreed to proceed.

## 2014-01-28 NOTE — Telephone Encounter (Signed)
Pt sched to see Dr. Dorisann Frames on 9.2 @ 8:30am...printed pt avs

## 2014-01-29 ENCOUNTER — Telehealth: Payer: Self-pay | Admitting: Hematology and Oncology

## 2014-01-29 NOTE — Telephone Encounter (Signed)
Faxed pt medical records to Dr. Junious Dresser

## 2014-03-07 ENCOUNTER — Other Ambulatory Visit: Payer: Self-pay | Admitting: Internal Medicine

## 2014-03-10 ENCOUNTER — Ambulatory Visit (INDEPENDENT_AMBULATORY_CARE_PROVIDER_SITE_OTHER): Payer: BC Managed Care – PPO | Admitting: Gastroenterology

## 2014-03-10 ENCOUNTER — Encounter: Payer: Self-pay | Admitting: Gastroenterology

## 2014-03-10 VITALS — BP 140/88 | HR 88 | Ht 62.0 in | Wt 158.4 lb

## 2014-03-10 DIAGNOSIS — Z860100 Personal history of colon polyps, unspecified: Secondary | ICD-10-CM | POA: Insufficient documentation

## 2014-03-10 DIAGNOSIS — R1310 Dysphagia, unspecified: Secondary | ICD-10-CM

## 2014-03-10 DIAGNOSIS — K219 Gastro-esophageal reflux disease without esophagitis: Secondary | ICD-10-CM

## 2014-03-10 DIAGNOSIS — N2889 Other specified disorders of kidney and ureter: Secondary | ICD-10-CM

## 2014-03-10 DIAGNOSIS — N289 Disorder of kidney and ureter, unspecified: Secondary | ICD-10-CM

## 2014-03-10 MED ORDER — MOVIPREP 100 G PO SOLR: 1.0000 | Freq: Once | ORAL | Status: DC

## 2014-03-10 MED ORDER — LANSOPRAZOLE 30 MG PO CPDR: 30.0000 mg | DELAYED_RELEASE_CAPSULE | Freq: Every day | ORAL | Status: DC

## 2014-03-10 NOTE — Patient Instructions (Signed)
You have been scheduled for an endoscopy with dilation and a colonoscopy. Please follow written instructions given to you at your visit today. If you use inhalers (even only as needed), please bring them with you on the day of your procedure.

## 2014-03-10 NOTE — Progress Notes (Signed)
_                                                                                                                History of Present Illness:  [name] is a 63 year old white female with history of diabetes, colon polyps referred for evaluation of dysphagia.  For several weeks she has noticed dysphagia to pills and now to solids.  He is also having fairly frequent pyrosis despite taking Prilosec.  Weight has been stable.  She occasionally has postprandial upper abdominal discomfort.  Patient had adenomatous polyps removed in 2010.  She has no GI complaints including change of bowel habits or rectal bleeding.   Past Medical History  Diagnosis Date  . Type II or unspecified type diabetes mellitus without mention of complication, not stated as uncontrolled   . Depression   . GERD (gastroesophageal reflux disease)   . Hyperlipidemia   . Nephrolithiasis   . HTN (hypertension)   . Gallstones 11-06  . Chest pain     Nuclear, adenosine,  December, 2013, low risk nuclear scan with small, moderate in intensity, fixed anteroseptal defect. This is possibly related to an LBBB versus small prior infarct. No ischemia  . LBBB (left bundle branch block)   . 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 Kidney mass 01/28/2014  . Arthritis   . Pneumonia    Past Surgical History  Procedure Laterality Date  . Tonsillectomy and adenoidectomy    . Cesarean section      x2 ? w/appy  . Knee arthroscopy Right 11/2005  . Splenectomy    . Esophagogastroduodenoscopy    . Hernia repair    . Pancreatic cyst excision    . Cholecystectomy    . Liver biopsy     family history includes Cancer in her other and paternal uncle; Coronary artery disease in her father; Dementia in her mother; Diabetes in her daughter, maternal grandmother, and mother; Heart attack in her father, paternal grandmother, and paternal uncle; Heart disease in her father; Hyperlipidemia in her brother; Hypertension in her  brother and father; Liver disease in her mother; Stroke in her maternal grandfather. Current Outpatient Prescriptions  Medication Sig Dispense Refill  . aspirin 81 MG EC tablet Take 81 mg by mouth daily.        . B-D ULTRAFINE III SHORT PEN 31G X 8 MM MISC USE DAILY WITH LANTUS INSULIN  1 each  3  . buPROPion (WELLBUTRIN XL) 150 MG 24 hr tablet Take 150 mg by mouth every morning.  90 tablet  3  . Cyanocobalamin (VITAMIN B12 PO) Take 2,500 mcg by mouth daily.      Marland Kitchen docusate sodium (COLACE) 100 MG capsule Take 100 mg by mouth daily.      . Doxepin HCl (SILENOR) 6 MG TABS Take 6 mg by mouth at bedtime as needed. For sleep      . Dulaglutide (TRULICITY) 1.5 KD/9.8PJ SOPN Inject 1 Act into the skin once a week.  12 pen  3  .  glucose blood (FREESTYLE LITE) test strip Test three times daily as directed  300 each  3  . HYDROcodone-acetaminophen (NORCO) 10-325 MG per tablet Take 1 tablet by mouth every 8 (eight) hours as needed.  90 tablet  0  . insulin glargine (LANTUS) 100 UNIT/ML injection Inject 30 Units into the skin at bedtime.      . INVOKANA 300 MG TABS TAKE 1 TABLET DAILY  90 tablet  2  . Lancets (FREESTYLE) lancets Test three times daily as directed  300 each  3  . metFORMIN (GLUCOPHAGE) 1000 MG tablet Take 1,000 mg by mouth 2 (two) times daily with a meal.      . Multiple Vitamin (MULTIVITAMIN) capsule Take 1 capsule by mouth daily.      Marland Kitchen omeprazole (PRILOSEC) 20 MG capsule Take 40 mg by mouth daily.      . pioglitazone (ACTOS) 30 MG tablet Take 1 tablet (30 mg total) by mouth daily.  90 tablet  3  . Probiotic Product (PHILLIPS COLON HEALTH PO) Take 1,000 mg by mouth every evening.      . Pyridoxine HCl (VITAMIN B-6 PO) Take 1 tablet by mouth daily.       . sertraline (ZOLOFT) 50 MG tablet TAKE 1 TABLET ONCE DAILY  90 tablet  2  . valsartan (DIOVAN) 320 MG tablet Take 1 tablet (320 mg total) by mouth daily.  90 tablet  3   No current facility-administered medications for this visit.    Allergies as of 03/10/2014 - Review Complete 03/10/2014  Allergen Reaction Noted  . Benicar [olmesartan]  01/18/2014  . Amoxicillin  07/14/2012    reports that she has never smoked. She has never used smokeless tobacco. She reports that she does not drink alcohol or use illicit drugs.   Review of Systems: Pertinent positive and negative review of systems were noted in the above HPI section. All other review of systems were otherwise negative.  Vital signs were reviewed in today's medical record Physical Exam: General: Well developed , well nourished, no acute distress Skin: anicteric Head: Normocephalic and atraumatic Eyes:  sclerae anicteric, EOMI Ears: Normal auditory acuity Mouth: No deformity or lesions Neck: Supple, no masses or thyromegaly Lungs: Clear throughout to auscultation Heart: Regular rate and rhythm; no murmurs, rubs or bruits Abdomen: Soft, non tender and non distended. No masses, hepatosplenomegaly or hernias noted. Normal Bowel sounds Rectal:deferred Musculoskeletal: Symmetrical with no gross deformities  Skin: No lesions on visible extremities Pulses:  Normal pulses noted Extremities: No clubbing, cyanosis, edema or deformities noted Neurological: Alert oriented x 4, grossly nonfocal Cervical Nodes:  No significant cervical adenopathy Inguinal Nodes: No significant inguinal adenopathy Psychological:  Alert and cooperative. Normal mood and affect  See Assessment and Plan under Problem List

## 2014-03-10 NOTE — Assessment & Plan Note (Signed)
Follow up per PCP

## 2014-03-10 NOTE — Assessment & Plan Note (Signed)
Patient is symptomatic despite taking daily Prilosec.  Recommendations #1 trial of Prevacid

## 2014-03-10 NOTE — Assessment & Plan Note (Signed)
Plan followup colonoscopy 

## 2014-03-10 NOTE — Assessment & Plan Note (Signed)
Rule out peptic esophageal stricture   Recommendations #1 upper endoscopy with dilation as indicated

## 2014-03-15 ENCOUNTER — Encounter: Payer: Self-pay | Admitting: Internal Medicine

## 2014-03-15 ENCOUNTER — Ambulatory Visit (AMBULATORY_SURGERY_CENTER): Payer: BC Managed Care – PPO | Admitting: Gastroenterology

## 2014-03-15 ENCOUNTER — Encounter: Payer: Self-pay | Admitting: Gastroenterology

## 2014-03-15 VITALS — BP 141/76 | HR 71 | Temp 97.3°F | Resp 17 | Ht 62.0 in | Wt 158.0 lb

## 2014-03-15 DIAGNOSIS — K648 Other hemorrhoids: Secondary | ICD-10-CM

## 2014-03-15 DIAGNOSIS — K5730 Diverticulosis of large intestine without perforation or abscess without bleeding: Secondary | ICD-10-CM

## 2014-03-15 DIAGNOSIS — Z860100 Personal history of colon polyps, unspecified: Secondary | ICD-10-CM

## 2014-03-15 LAB — GLUCOSE, CAPILLARY
GLUCOSE-CAPILLARY: 106 mg/dL — AB (ref 70–99)
GLUCOSE-CAPILLARY: 91 mg/dL (ref 70–99)

## 2014-03-15 MED ORDER — SODIUM CHLORIDE 0.9 % IV SOLN: 500.0000 mL | INTRAVENOUS | Status: DC

## 2014-03-15 NOTE — Patient Instructions (Signed)
YOU HAD AN ENDOSCOPIC PROCEDURE TODAY AT THE [facility]: Refer to the procedure report that was given to you for any specific questions about what was found during the examination.  If the procedure report does not answer your questions, please call your gastroenterologist to clarify.  If you requested that your care partner not be given the details of your procedure findings, then the procedure report has been included in a sealed envelope for you to review at your convenience later.  YOU SHOULD EXPECT: Some feelings of bloating in the abdomen. Passage of more gas than usual.  Walking can help get rid of the air that was put into your GI tract during the procedure and reduce the bloating. If you had a lower endoscopy (such as a colonoscopy or flexible sigmoidoscopy) you may notice spotting of blood in your stool or on the toilet paper. If you underwent a bowel prep for your procedure, then you may not have a normal bowel movement for a few days.  DIET: Your first meal following the procedure should be a light meal and then it is ok to progress to your normal diet.  A half-sandwich or bowl of soup is an example of a good first meal.  Heavy or fried foods are harder to digest and may make you feel nauseous or bloated.  Likewise meals heavy in dairy and vegetables can cause extra gas to form and this can also increase the bloating.  Drink plenty of fluids but you should avoid alcoholic beverages for 24 hours.  ACTIVITY: Your care partner should take you home directly after the procedure.  You should plan to take it easy, moving slowly for the rest of the day.  You can resume normal activity the day after the procedure however you should NOT DRIVE or use heavy machinery for 24 hours (because of the sedation medicines used during the test).    SYMPTOMS TO REPORT IMMEDIATELY: A gastroenterologist can be reached at any hour.  During normal business hours, 8:30 AM to 5:00 PM Monday through Friday,  call [phone].  After hours and on weekends, please call the GI answering service at [phone] who will take a message and have the physician on call contact you.   Following lower endoscopy (colonoscopy or flexible sigmoidoscopy):  Excessive amounts of blood in the stool  Significant tenderness or worsening of abdominal pains  Swelling of the abdomen that is new, acute  Fever of 100F or higher  FOLLOW UP: If any biopsies were taken you will be contacted by phone or by letter within the next 1-3 weeks.  Call your gastroenterologist if you have not heard about the biopsies in 3 weeks.  Our staff will call the home number listed on your records the next business day following your procedure to check on you and address any questions or concerns that you may have at that time regarding the information given to you following your procedure. This is a courtesy call and so if there is no answer at the home number and we have not heard from you through the emergency physician on call, we will assume that you have returned to your regular daily activities without incident.  SIGNATURES/CONFIDENTIALITY: You and/or your care partner have signed paperwork which will be entered into your electronic medical record.  These signatures attest to the fact that that the information above on your After Visit Summary has been reviewed and is understood.  Full responsibility of the confidentiality of this   discharge information lies with you and/or your care-partner.  Resume medications. Information given on diverticulosis and high fiber diet with discharge instructions. 

## 2014-03-15 NOTE — Op Note (Signed)
[facility]  [address]. [place], [address]   COLONOSCOPY PROCEDURE REPORT  PATIENT: Margaret, Norman  MR#: [id number] BIRTHDATE: 02-26-1951 , 62  yrs. old GENDER: female ENDOSCOPIST: Inda Castle, MD REFERRED LT:JQZESP Evalina Field, M.D. PROCEDURE DATE:  03/15/2014 PROCEDURE:   Colonoscopy, diagnostic First Screening Colonoscopy - Avg.  risk and is 50 yrs.  old or older - No.  Prior Negative Screening - Now for repeat screening. N/A  History of Adenoma - Now for follow-up colonoscopy & has been > or = to 3 yrs.  Yes hx of adenoma.  Has been 3 or more years since last colonoscopy.  Polyps Removed Today? No.  Recommend repeat exam, <10 yrs? No. ASA CLASS:   Class II INDICATIONS:high risk personal history of colonic polyps. 2010 MEDICATIONS: Monitored anesthesia care and Propofol (Diprivan) 270 mg IV  DESCRIPTION OF PROCEDURE:   After the risks benefits and alternatives of the procedure were thoroughly explained, informed consent was obtained. Digital exam  revealed no abnormalities of the rectum.   The LB [id number]  endoscope was introduced through the anus and advanced to the cecum, which was identified by both the appendix and ileocecal valve. No adverse events experienced.   The quality of the prep was Suprep fair  The instrument was then slowly withdrawn as the colon was fully examined.      COLON FINDINGS: There was moderate, scattered diverticulosis throughout the colon including the sigmoid descending transverse descending colon and cecum.Marland Kitchen  Retroflexed views revealed no abnormalities. The time to cecum=7 minutes 23 seconds.  Withdrawal time=8 minutes 02 seconds.  The scope was withdrawn and the procedure completed. COMPLICATIONS: There were no complications.  ENDOSCOPIC IMPRESSION: Moderate diverticulosis was noted in the ascending colon, at the cecum, in the transverse colon, sigmoid colon, and left colon  RECOMMENDATIONS: Colonoscopy  10 years  eSigned:  Inda Castle, MD 03/15/2014 11:57 AM   cc:   PATIENT NAME:  Margaret, Norman MR#: [id number]

## 2014-03-15 NOTE — Progress Notes (Signed)
Procedure ends, to recovery, report given and VSS. 

## 2014-03-16 ENCOUNTER — Other Ambulatory Visit: Payer: Self-pay | Admitting: Internal Medicine

## 2014-03-16 ENCOUNTER — Telehealth: Payer: Self-pay

## 2014-03-16 DIAGNOSIS — M5416 Radiculopathy, lumbar region: Secondary | ICD-10-CM

## 2014-03-16 MED ORDER — HYDROCODONE-ACETAMINOPHEN 10-325 MG PO TABS: 1.0000 | ORAL_TABLET | Freq: Three times a day (TID) | ORAL | Status: DC | PRN

## 2014-03-16 NOTE — Telephone Encounter (Signed)
Left a message at #[phone] for the pt to call if any questions or concerns. maw

## 2014-03-19 ENCOUNTER — Encounter: Payer: Self-pay | Admitting: Gastroenterology

## 2014-03-19 LAB — HM COLONOSCOPY: HM COLON: NORMAL

## 2014-03-24 ENCOUNTER — Encounter: Payer: Self-pay | Admitting: Gastroenterology

## 2014-03-24 ENCOUNTER — Ambulatory Visit (AMBULATORY_SURGERY_CENTER): Payer: BC Managed Care – PPO | Admitting: Gastroenterology

## 2014-03-24 VITALS — BP 151/81 | HR 83 | Temp 97.8°F | Resp 10 | Ht 62.0 in | Wt 158.0 lb

## 2014-03-24 DIAGNOSIS — R1310 Dysphagia, unspecified: Secondary | ICD-10-CM

## 2014-03-24 DIAGNOSIS — K222 Esophageal obstruction: Secondary | ICD-10-CM

## 2014-03-24 LAB — GLUCOSE, CAPILLARY
Glucose-Capillary: 85 mg/dL (ref 70–99)
Glucose-Capillary: 176 mg/dL — ABNORMAL HIGH (ref 70–99)

## 2014-03-24 MED ORDER — SODIUM CHLORIDE 0.9 % IV SOLN: 500.0000 mL | INTRAVENOUS | Status: DC

## 2014-03-24 NOTE — Patient Instructions (Signed)
Impressions/recommendations:  Stricture (handout given) Dilation (post dilation diet handout given) Polyps  Repeat dilations as needed  YOU HAD AN ENDOSCOPIC PROCEDURE TODAY AT [facility]: Refer to the procedure report that was given to you for any specific questions about what was found during the examination.  If the procedure report does not answer your questions, please call your gastroenterologist to clarify.  If you requested that your care partner not be given the details of your procedure findings, then the procedure report has been included in a sealed envelope for you to review at your convenience later.  YOU SHOULD EXPECT: Some feelings of bloating in the abdomen. Passage of more gas than usual.  Walking can help get rid of the air that was put into your GI tract during the procedure and reduce the bloating. If you had a lower endoscopy (such as a colonoscopy or flexible sigmoidoscopy) you may notice spotting of blood in your stool or on the toilet paper. If you underwent a bowel prep for your procedure, then you may not have a normal bowel movement for a few days.  DIET: Your first meal following the procedure should be a light meal and then it is ok to progress to your normal diet.  A half-sandwich or bowl of soup is an example of a good first meal.  Heavy or fried foods are harder to digest and may make you feel nauseous or bloated.  Likewise meals heavy in dairy and vegetables can cause extra gas to form and this can also increase the bloating.  Drink plenty of fluids but you should avoid alcoholic beverages for 24 hours.  ACTIVITY: Your care partner should take you home directly after the procedure.  You should plan to take it easy, moving slowly for the rest of the day.  You can resume normal activity the day after the procedure however you should NOT DRIVE or use heavy machinery for 24 hours (because of the sedation medicines used during the test).    SYMPTOMS TO  REPORT IMMEDIATELY: A gastroenterologist can be reached at any hour.  During normal business hours, 8:30 AM to 5:00 PM Monday through Friday, call [phone].  After hours and on weekends, please call the GI answering service at [phone] who will take a message and have the physician on call contact you.    Following upper endoscopy (EGD)  Vomiting of blood or coffee ground material  New chest pain or pain under the shoulder blades  Painful or persistently difficult swallowing  New shortness of breath  Fever of 100F or higher  Black, tarry-looking stools  FOLLOW UP: If any biopsies were taken you will be contacted by phone or by letter within the next 1-3 weeks.  Call your gastroenterologist if you have not heard about the biopsies in 3 weeks.  Our staff will call the home number listed on your records the next business day following your procedure to check on you and address any questions or concerns that you may have at that time regarding the information given to you following your procedure. This is a courtesy call and so if there is no answer at the home number and we have not heard from you through the emergency physician on call, we will assume that you have returned to your regular daily activities without incident.  SIGNATURES/CONFIDENTIALITY: You and/or your care partner have signed paperwork which will be entered into your electronic medical record.  These signatures attest to the fact  that that the information above on your After Visit Summary has been reviewed and is understood.  Full responsibility of the confidentiality of this discharge information lies with you and/or your care-partner.

## 2014-03-24 NOTE — Progress Notes (Signed)
Called to room to assist during endoscopic procedure.  Patient ID and intended procedure confirmed with present staff. Received instructions for my participation in the procedure from the performing physician.  

## 2014-03-24 NOTE — Progress Notes (Signed)
Patient awakening,vss,report to rn 

## 2014-03-24 NOTE — Op Note (Signed)
[facility]  [address]. [place], [address]   ENDOSCOPY PROCEDURE REPORT  PATIENT: Margaret, Norman  MR#: [id number] BIRTHDATE: 1951/02/18 , 62  yrs. old GENDER: female ENDOSCOPIST: Inda Castle, MD REFERRED BY:  Janith Lima, M.D. PROCEDURE DATE:  03/24/2014 PROCEDURE:  EGD, diagnostic and Maloney dilation of esophagus ASA CLASS:     Class II INDICATIONS:  dysphagia. MEDICATIONS: Monitored anesthesia care, Propofol 100 mg IV, and xylocaine 40mg  IV TOPICAL ANESTHETIC:  DESCRIPTION OF PROCEDURE: After the risks benefits and alternatives of the procedure were thoroughly explained, informed consent was obtained.  The LB [id number] endoscope was introduced through the mouth and advanced to the third portion of the duodenum , Without limitations.  The instrument was slowly withdrawn as the mucosa was fully examined.    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  The stricture was easily traversable. The stricture was dilated using a 84mm (54Fr) Maloney dilator. Except for the findings listed the EGD was otherwise normal. STOMACH: Multiple smooth polyps (hyperplastic-appearing) measuring 2 mm in size were found in the gastric body and gastric fundus. Retroflexed views revealed no abnormalities.     The scope was then withdrawn from the patient and the procedure completed.  COMPLICATIONS: There were no immediate complications.  ENDOSCOPIC IMPRESSION: 1.   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   Multiple polyps  (hyperplastic appearing )measuring 2 mm in size were found in the gastric body and gastric fundus  RECOMMENDATIONS: Repeat dilations as needed  REPEAT EXAM:  eSigned:  Inda Castle, MD 03/24/2014 2:22 PM    CC:

## 2014-03-25 ENCOUNTER — Telehealth: Payer: Self-pay

## 2014-03-25 NOTE — Telephone Encounter (Signed)
Left message on answering machine. 

## 2014-03-26 ENCOUNTER — Telehealth: Payer: Self-pay | Admitting: *Deleted

## 2014-03-26 MED ORDER — LANSOPRAZOLE 30 MG PO CPDR: 30.0000 mg | DELAYED_RELEASE_CAPSULE | Freq: Every day | ORAL | Status: DC

## 2014-03-26 NOTE — Telephone Encounter (Signed)
Med refill request from [facility]

## 2014-03-30 ENCOUNTER — Encounter: Payer: Self-pay | Admitting: Gastroenterology

## 2014-04-05 ENCOUNTER — Telehealth: Payer: Self-pay | Admitting: *Deleted

## 2014-04-05 NOTE — Telephone Encounter (Signed)
Ok I will work on this today ===View-only below this line===   ----- Message -----    From: [name]    Sent: 04/01/2014 10:33 AM EDT      To: Erskine Emery, MD Subject: RE: Non-Urgent Medical Question  [phone]  ----- Message ----- From: CMA Eustace Moore Sent: 03/31/2014  1:35 PM EDT To: [name] Subject: RE: Non-Urgent Medical Question  Hey Kimara,  Do you have the contact number for me to call for the authorization   Thank You  ----- Message -----    From: [name]    Sent: 03/30/2014  9:31 AM EDT      To: Erskine Emery, MD Subject: Non-Urgent Medical Question  I had sent a request to [facility] for refill of the LANSOPRAZOLE DR CAP 8030 30MG .  They are telling me it can not be filled due to not having authorization from Dr. Deatra Ina.  My member ID # is [id number] - Ref # [id number].  Can you fax this prescription to [facility]?  Thank you.

## 2014-04-07 NOTE — Telephone Encounter (Signed)
Sent to [facility] waiting on response

## 2014-04-08 NOTE — Telephone Encounter (Signed)
Per Fax from [facility]  This medication is covered by your plans base day and quantity limits    Autofax ID [phone]

## 2014-04-22 ENCOUNTER — Other Ambulatory Visit: Payer: Self-pay | Admitting: Internal Medicine

## 2014-04-22 DIAGNOSIS — M5416 Radiculopathy, lumbar region: Secondary | ICD-10-CM

## 2014-04-22 MED ORDER — HYDROCODONE-ACETAMINOPHEN 10-325 MG PO TABS: 1.0000 | ORAL_TABLET | Freq: Three times a day (TID) | ORAL | Status: DC | PRN

## 2014-04-25 ENCOUNTER — Encounter: Payer: Self-pay | Admitting: Internal Medicine

## 2014-05-01 ENCOUNTER — Encounter: Payer: Self-pay | Admitting: Internal Medicine

## 2014-05-14 ENCOUNTER — Ambulatory Visit (INDEPENDENT_AMBULATORY_CARE_PROVIDER_SITE_OTHER): Payer: BC Managed Care – PPO | Admitting: Family

## 2014-05-14 ENCOUNTER — Encounter: Payer: Self-pay | Admitting: Family

## 2014-05-14 ENCOUNTER — Ambulatory Visit (INDEPENDENT_AMBULATORY_CARE_PROVIDER_SITE_OTHER): Payer: BC Managed Care – PPO

## 2014-05-14 VITALS — BP 106/68 | HR 96 | Temp 98.0°F | Resp 18 | Ht 62.0 in | Wt 156.0 lb

## 2014-05-14 DIAGNOSIS — J0110 Acute frontal sinusitis, unspecified: Secondary | ICD-10-CM

## 2014-05-14 DIAGNOSIS — J0100 Acute maxillary sinusitis, unspecified: Secondary | ICD-10-CM | POA: Insufficient documentation

## 2014-05-14 DIAGNOSIS — J029 Acute pharyngitis, unspecified: Secondary | ICD-10-CM

## 2014-05-14 DIAGNOSIS — Z23 Encounter for immunization: Secondary | ICD-10-CM

## 2014-05-14 LAB — POCT RAPID STREP A (OFFICE): Rapid Strep A Screen: NEGATIVE

## 2014-05-14 MED ORDER — HYDROCODONE-HOMATROPINE 5-1.5 MG/5ML PO SYRP: 5.0000 mL | ORAL_SOLUTION | Freq: Three times a day (TID) | ORAL | Status: DC | PRN

## 2014-05-14 MED ORDER — DOXYCYCLINE HYCLATE 100 MG PO TABS: 100.0000 mg | ORAL_TABLET | Freq: Two times a day (BID) | ORAL | Status: DC

## 2014-05-14 NOTE — Progress Notes (Signed)
Pre visit review using our clinic review tool, if applicable. No additional management support is needed unless otherwise documented below in the visit note. 

## 2014-05-14 NOTE — Assessment & Plan Note (Signed)
Symptoms and exam consistent with bacterial sinusitis. Start doxycycline. Start Hycodan as needed for cough. Instructed on proper use of hycodan. Continue OTC medications as needed for symptom relief. Sinus care information provided. Follow up if symptoms worsen or fail to improve.

## 2014-05-14 NOTE — Patient Instructions (Signed)
Thank you for choosing [facility].  Summary/Instructions:  Your prescription(s) have been submitted to your pharmacy. Please take as directed and contact our office if you believe you are having problem(s) with the medication(s).  If your symptoms worsen or fail to improve, please contact our office for further instruction, or in case of emergency go directly to the emergency room at the closest medical facility.   General Recommendations: Please drink plenty of fluids. Sleep in humidified air if possible. Use saline nasal sprays or the Netti pot as needed.   Medicaitons:  Flonase as needed to assist with congestion. You may use Mucinex to help break up congestion as you feel is needed. Tylenol as needed for fever. Sudafed for congestion.   Cepacol for sore throat and honey as needed  Sinusitis Sinusitis is redness, soreness, and inflammation of the paranasal sinuses. Paranasal sinuses are air pockets within the bones of your face (beneath the eyes, the middle of the forehead, or above the eyes). In healthy paranasal sinuses, mucus is able to drain out, and air is able to circulate through them by way of your nose. However, when your paranasal sinuses are inflamed, mucus and air can become trapped. This can allow bacteria and other germs to grow and cause infection. Sinusitis can develop quickly and last only a short time (acute) or continue over a long period (chronic). Sinusitis that lasts for more than 12 weeks is considered chronic.  CAUSES  Causes of sinusitis include:  Allergies.  Structural abnormalities, such as displacement of the cartilage that separates your nostrils (deviated septum), which can decrease the air flow through your nose and sinuses and affect sinus drainage.  Functional abnormalities, such as when the small hairs (cilia) that line your sinuses and help remove mucus do not work properly or are not present. SIGNS AND SYMPTOMS  Symptoms of acute and chronic  sinusitis are the same. The primary symptoms are pain and pressure around the affected sinuses. Other symptoms include:  Upper toothache.  Earache.  Headache.  Bad breath.  Decreased sense of smell and taste.  A cough, which worsens when you are lying flat.  Fatigue.  Fever.  Thick drainage from your nose, which often is green and may contain pus (purulent).  Swelling and warmth over the affected sinuses. DIAGNOSIS  Your health care provider will perform a physical exam. During the exam, your health care provider may:  Look in your nose for signs of abnormal growths in your nostrils (nasal polyps).  Tap over the affected sinus to check for signs of infection.  View the inside of your sinuses (endoscopy) using an imaging device that has a light attached (endoscope). If your health care provider suspects that you have chronic sinusitis, one or more of the following tests may be recommended:  Allergy tests.  Nasal culture. A sample of mucus is taken from your nose, sent to a lab, and screened for bacteria.  Nasal cytology. A sample of mucus is taken from your nose and examined by your health care provider to determine if your sinusitis is related to an allergy. TREATMENT  Most cases of acute sinusitis are related to a viral infection and will resolve on their own within 10 days. Sometimes medicines are prescribed to help relieve symptoms (pain medicine, decongestants, nasal steroid sprays, or saline sprays).  However, for sinusitis related to a bacterial infection, your health care provider will prescribe antibiotic medicines. These are medicines that will help kill the bacteria causing the infection.  Rarely, sinusitis is caused by a fungal infection. In theses cases, your health care provider will prescribe antifungal medicine. For some cases of chronic sinusitis, surgery is needed. Generally, these are cases in which sinusitis recurs more than 3 times per year, despite other  treatments. HOME CARE INSTRUCTIONS   Drink plenty of water. Water helps thin the mucus so your sinuses can drain more easily.  Use a humidifier.  Inhale steam 3 to 4 times a day (for example, sit in the bathroom with the shower running).  Apply a warm, moist washcloth to your face 3 to 4 times a day, or as directed by your health care provider.  Use saline nasal sprays to help moisten and clean your sinuses.  Take medicines only as directed by your health care provider.  If you were prescribed either an antibiotic or antifungal medicine, finish it all even if you start to feel better. SEEK IMMEDIATE MEDICAL CARE IF:  You have increasing pain or severe headaches.  You have nausea, vomiting, or drowsiness.  You have swelling around your face.  You have vision problems.  You have a stiff neck.  You have difficulty breathing. MAKE SURE YOU:   Understand these instructions.  Will watch your condition.  Will get help right away if you are not doing well or get worse. Document Released: 06/11/2005 Document Revised: 10/26/2013 Document Reviewed: 06/26/2011 [facility] Patient Information 2015 [place], [place]. This information is not intended to replace advice given to you by your health care provider. Make sure you discuss any questions you have with your health care provider.

## 2014-05-14 NOTE — Progress Notes (Signed)
Subjective:    Patient ID: Margaret Norman, female    DOB: 04-Apr-1951, 63 y.o.   MRN: [id number]  Chief Complaint  Patient presents with  . Sore Throat    very sore throat, cough, drainage x2 weeks    HPI:  Margaret Norman is a 63 y.o. female who presents today    Acute sinus drainage, sore throat, cough, and a  lot of headaches started about 2 weeks ago. Has tried multiple sinus, congestion, cold OTC medications. Course of the disease has stayed about the same, however there are some days that are worse than others. Had chills during the first week, but has not had any recently. Describes some chest tightness on occasion.  Allergies  Allergen Reactions  . Benicar [Olmesartan]     GI upset  . Amoxicillin     Sore tongue   Current Outpatient Prescriptions on File Prior to Visit  Medication Sig Dispense Refill  . aspirin 81 MG EC tablet Take 81 mg by mouth daily.      . B-D ULTRAFINE III SHORT PEN 31G X 8 MM MISC USE DAILY WITH LANTUS INSULIN 1 each 3  . buPROPion (WELLBUTRIN XL) 150 MG 24 hr tablet Take 150 mg by mouth every morning. 90 tablet 3  . Cyanocobalamin (VITAMIN B12 PO) Take 2,500 mcg by mouth daily.    Marland Kitchen docusate sodium (COLACE) 100 MG capsule Take 100 mg by mouth daily.    . Doxepin HCl (SILENOR) 6 MG TABS Take 6 mg by mouth at bedtime as needed. For sleep    . Dulaglutide (TRULICITY) 1.5 QI/6.9GE SOPN Inject 1 Act into the skin once a week. 12 pen 3  . glucose blood (FREESTYLE LITE) test strip Test three times daily as directed 300 each 3  . HYDROcodone-acetaminophen (NORCO) 10-325 MG per tablet Take 1 tablet by mouth every 8 (eight) hours as needed. 90 tablet 0  . insulin glargine (LANTUS) 100 UNIT/ML injection Inject 30 Units into the skin at bedtime.    . INVOKANA 300 MG TABS TAKE 1 TABLET DAILY 90 tablet 2  . Lancets (FREESTYLE) lancets Test three times daily as directed 300 each 3  . lansoprazole (PREVACID) 30 MG capsule Take 1 capsule (30 mg total) by mouth  daily at 12 noon. 90 capsule 3  . lansoprazole (PREVACID) 30 MG capsule Take 1 capsule (30 mg total) by mouth daily at 12 noon. 90 capsule 3  . metFORMIN (GLUCOPHAGE) 1000 MG tablet Take 1,000 mg by mouth 2 (two) times daily with a meal.    . Multiple Vitamin (MULTIVITAMIN) capsule Take 1 capsule by mouth daily.    Marland Kitchen omeprazole (PRILOSEC) 20 MG capsule Take 40 mg by mouth daily.    . pioglitazone (ACTOS) 30 MG tablet Take 1 tablet (30 mg total) by mouth daily. 90 tablet 3  . Probiotic Product (PHILLIPS COLON HEALTH PO) Take 1,000 mg by mouth every evening.    . Pyridoxine HCl (VITAMIN B-6 PO) Take 1 tablet by mouth daily.     . sertraline (ZOLOFT) 50 MG tablet TAKE 1 TABLET ONCE DAILY 90 tablet 2  . valsartan (DIOVAN) 320 MG tablet Take 1 tablet (320 mg total) by mouth daily. 90 tablet 3   No current facility-administered medications on file prior to visit.   Review of Systems    See HPI  Objective:    BP 106/68 mmHg  Pulse 96  Temp(Src) 98 F (36.7 C) (Oral)  Resp 18  Ht 5'  2" (1.575 m)  Wt 156 lb (70.761 kg)  BMI 28.53 kg/m2  SpO2 97% Nursing note and vital signs reviewed.  Physical Exam  Constitutional: She is oriented to person, place, and time. She appears well-developed and well-nourished. No distress.  HENT:  Right Ear: Hearing, tympanic membrane, external ear and ear canal normal.  Left Ear: Hearing, tympanic membrane, external ear and ear canal normal.  Nose: Right sinus exhibits maxillary sinus tenderness. Right sinus exhibits no frontal sinus tenderness. Left sinus exhibits maxillary sinus tenderness. Left sinus exhibits no frontal sinus tenderness.  Mouth/Throat: Uvula is midline and mucous membranes are normal. Posterior oropharyngeal erythema present.  Cardiovascular: Normal rate, regular rhythm, normal heart sounds and intact distal pulses.   Pulmonary/Chest: Effort normal and breath sounds normal.  Lymphadenopathy:    She has cervical adenopathy.    Neurological: She is alert and oriented to person, place, and time.  Skin: Skin is warm and dry.  Psychiatric: She has a normal mood and affect. Her behavior is normal. Judgment and thought content normal.       Assessment & Plan:

## 2014-05-21 ENCOUNTER — Ambulatory Visit: Payer: BC Managed Care – PPO | Admitting: Internal Medicine

## 2014-05-24 ENCOUNTER — Ambulatory Visit (INDEPENDENT_AMBULATORY_CARE_PROVIDER_SITE_OTHER): Payer: BC Managed Care – PPO | Admitting: Internal Medicine

## 2014-05-24 ENCOUNTER — Encounter: Payer: Self-pay | Admitting: Internal Medicine

## 2014-05-24 ENCOUNTER — Other Ambulatory Visit (INDEPENDENT_AMBULATORY_CARE_PROVIDER_SITE_OTHER): Payer: BC Managed Care – PPO

## 2014-05-24 VITALS — BP 130/86 | HR 77 | Temp 97.9°F | Ht 63.0 in | Wt 158.0 lb

## 2014-05-24 DIAGNOSIS — E785 Hyperlipidemia, unspecified: Secondary | ICD-10-CM

## 2014-05-24 DIAGNOSIS — E118 Type 2 diabetes mellitus with unspecified complications: Secondary | ICD-10-CM

## 2014-05-24 DIAGNOSIS — I10 Essential (primary) hypertension: Secondary | ICD-10-CM

## 2014-05-24 DIAGNOSIS — M5416 Radiculopathy, lumbar region: Secondary | ICD-10-CM

## 2014-05-24 DIAGNOSIS — M5417 Radiculopathy, lumbosacral region: Secondary | ICD-10-CM

## 2014-05-24 DIAGNOSIS — E1165 Type 2 diabetes mellitus with hyperglycemia: Secondary | ICD-10-CM

## 2014-05-24 DIAGNOSIS — IMO0002 Reserved for concepts with insufficient information to code with codable children: Secondary | ICD-10-CM

## 2014-05-24 LAB — LIPID PANEL
CHOL/HDL RATIO: 3
Cholesterol: 154 mg/dL (ref 0–200)
HDL: 44.4 mg/dL (ref >39.00)
LDL CALC: 87 mg/dL (ref 0–99)
NONHDL: 109.6
TRIGLYCERIDES: 115 mg/dL (ref 0.0–149.0)
VLDL: 23 mg/dL (ref 0.0–40.0)

## 2014-05-24 LAB — CBC WITH DIFFERENTIAL/PLATELET
BASOS PCT: 0.4 % (ref 0.0–3.0)
Basophils Absolute: 0.1 10*3/uL (ref 0.0–0.1)
EOS PCT: 1.2 % (ref 0.0–5.0)
Eosinophils Absolute: 0.2 10*3/uL (ref 0.0–0.7)
HCT: 39.1 % (ref 36.0–46.0)
Hemoglobin: 12.6 g/dL (ref 12.0–15.0)
LYMPHS PCT: 34.6 % (ref 12.0–46.0)
Lymphs Abs: 6.1 10*3/uL — ABNORMAL HIGH (ref 0.7–4.0)
MCHC: 32.2 g/dL (ref 30.0–36.0)
MCV: 86.9 fl (ref 78.0–100.0)
MONOS PCT: 9.6 % (ref 3.0–12.0)
Monocytes Absolute: 1.7 10*3/uL — ABNORMAL HIGH (ref 0.1–1.0)
NEUTROS PCT: 54.2 % (ref 43.0–77.0)
Neutro Abs: 9.6 10*3/uL — ABNORMAL HIGH (ref 1.4–7.7)
PLATELETS: 455 10*3/uL — AB (ref 150.0–400.0)
RBC: 4.5 Mil/uL (ref 3.87–5.11)
RDW: 14.1 % (ref 11.5–15.5)
WBC: 17.6 10*3/uL — AB (ref 4.0–10.5)

## 2014-05-24 LAB — HEMOGLOBIN A1C: Hgb A1c MFr Bld: 7.6 % — ABNORMAL HIGH (ref 4.6–6.5)

## 2014-05-24 LAB — COMPREHENSIVE METABOLIC PANEL
ALBUMIN: 3.9 g/dL (ref 3.5–5.2)
ALK PHOS: 69 U/L (ref 39–117)
ALT: 11 U/L (ref 0–35)
AST: 14 U/L (ref 0–37)
BUN: 17 mg/dL (ref 6–23)
CALCIUM: 9.2 mg/dL (ref 8.4–10.5)
CO2: 22 meq/L (ref 19–32)
Chloride: 105 mEq/L (ref 96–112)
Creatinine, Ser: 0.6 mg/dL (ref 0.4–1.2)
GFR: 118.27 mL/min (ref >60.00)
GLUCOSE: 87 mg/dL (ref 70–99)
POTASSIUM: 3.6 meq/L (ref 3.5–5.1)
Sodium: 137 mEq/L (ref 135–145)
Total Bilirubin: 0.4 mg/dL (ref 0.2–1.2)
Total Protein: 7 g/dL (ref 6.0–8.3)

## 2014-05-24 LAB — BASIC METABOLIC PANEL
BUN: 17 mg/dL (ref 6–23)
CHLORIDE: 105 meq/L (ref 96–112)
CO2: 22 mEq/L (ref 19–32)
CREATININE: 0.6 mg/dL (ref 0.4–1.2)
Calcium: 9.2 mg/dL (ref 8.4–10.5)
GFR: 118.27 mL/min (ref >60.00)
Glucose, Bld: 87 mg/dL (ref 70–99)
POTASSIUM: 3.6 meq/L (ref 3.5–5.1)
Sodium: 137 mEq/L (ref 135–145)

## 2014-05-24 LAB — TSH: TSH: 0.92 u[IU]/mL (ref 0.35–4.50)

## 2014-05-24 MED ORDER — HYDROCODONE-ACETAMINOPHEN 10-325 MG PO TABS: 1.0000 | ORAL_TABLET | Freq: Three times a day (TID) | ORAL | Status: DC | PRN

## 2014-05-24 NOTE — Patient Instructions (Signed)

## 2014-05-24 NOTE — Progress Notes (Signed)
Subjective:    Patient ID: Margaret Norman, female    DOB: 05-22-1951, 63 y.o.   MRN: [id number]  Diabetes She presents for her follow-up diabetic visit. She has type 2 diabetes mellitus. Her disease course has been stable. There are no hypoglycemic associated symptoms. Pertinent negatives for hypoglycemia include no dizziness or headaches. Pertinent negatives for diabetes include no blurred vision, no chest pain, no fatigue, no foot paresthesias, no foot ulcerations, no polydipsia, no polyphagia, no polyuria, no visual change, no weakness and no weight loss. There are no hypoglycemic complications. Symptoms are stable. There are no diabetic complications. Current diabetic treatment includes oral agent (triple therapy) and insulin injections. She is compliant with treatment all of the time. Her weight is stable. She is following a generally healthy diet. Meal planning includes avoidance of concentrated sweets. She participates in exercise intermittently. Her home blood glucose trend is decreasing steadily. An ACE inhibitor/angiotensin II receptor blocker is being taken. She does not see a podiatrist.Eye exam is current.      Review of Systems  Constitutional: Negative.  Negative for fever, chills, weight loss, diaphoresis, activity change, appetite change and fatigue.  HENT: Negative.   Eyes: Negative.  Negative for blurred vision.  Respiratory: Negative.  Negative for cough, choking, chest tightness, shortness of breath and stridor.   Cardiovascular: Negative.  Negative for chest pain, palpitations and leg swelling.  Gastrointestinal: Negative.  Negative for nausea, vomiting, abdominal pain, diarrhea and constipation.  Endocrine: Negative.  Negative for polydipsia, polyphagia and polyuria.  Genitourinary: Negative.   Musculoskeletal: Positive for back pain. Negative for myalgias, arthralgias and neck pain.  Skin: Negative.  Negative for rash.  Allergic/Immunologic: Negative.   Neurological:  Negative.  Negative for dizziness, weakness, light-headedness and headaches.  Hematological: Negative.  Negative for adenopathy. Does not bruise/bleed easily.  Psychiatric/Behavioral: Negative.        Objective:   Physical Exam  Constitutional: She is oriented to person, place, and time. She appears well-developed and well-nourished. No distress.  HENT:  Head: Normocephalic and atraumatic.  Mouth/Throat: Oropharynx is clear and moist. No oropharyngeal exudate.  Eyes: Conjunctivae are normal. Right eye exhibits no discharge. Left eye exhibits no discharge. No scleral icterus.  Neck: Normal range of motion. Neck supple. No JVD present. No tracheal deviation present. No thyromegaly present.  Cardiovascular: Normal rate, regular rhythm, normal heart sounds and intact distal pulses.  Exam reveals no gallop and no friction rub.   No murmur heard. Pulmonary/Chest: Effort normal and breath sounds normal. No stridor. No respiratory distress. She has no wheezes. She has no rales. She exhibits no tenderness.  Abdominal: Soft. Bowel sounds are normal. She exhibits no distension and no mass. There is no tenderness. There is no rebound and no guarding.  Musculoskeletal: Normal range of motion. She exhibits no edema or tenderness.  Lymphadenopathy:    She has no cervical adenopathy.  Neurological: She is oriented to person, place, and time.  Skin: Skin is warm and dry. No rash noted. She is not diaphoretic. No erythema. No pallor.  Vitals reviewed.     Lab Results  Component Value Date   WBC 15.5* 01/21/2014   HGB 13.1 01/21/2014   HCT 41.8 01/21/2014   PLT 520* 01/21/2014   GLUCOSE 172* 01/21/2014   CHOL 218* 08/25/2013   TRIG 216.0* 08/25/2013   HDL 50.10 08/25/2013   LDLDIRECT 137.9 02/04/2013   LDLCALC 125* 08/25/2013   ALT 16 01/21/2014   AST 19 01/21/2014   NA  143 01/21/2014   K 4.5 01/21/2014   CL 103 01/21/2014   CREATININE 0.67 01/21/2014   BUN 13 01/21/2014   CO2 22  01/21/2014   TSH 1.44 02/04/2013   HGBA1C 9.0* 12/28/2013   MICROALBUR 0.5 11/15/2008      Assessment & Plan:

## 2014-05-24 NOTE — Progress Notes (Signed)
Pre visit review using our clinic review tool, if applicable. No additional management support is needed unless otherwise documented below in the visit note. 

## 2014-05-25 NOTE — Assessment & Plan Note (Signed)
She will cont norco as needed for pain 

## 2014-05-25 NOTE — Assessment & Plan Note (Signed)
Her BP is well controlled I will monitor her lytes and renal function 

## 2014-05-25 NOTE — Assessment & Plan Note (Signed)
She has achieved her LDL goal 

## 2014-05-25 NOTE — Assessment & Plan Note (Signed)
The A1C shows that her blood sugars have improved She will cont the same meds and will work on her lifestyle modifications

## 2014-07-13 ENCOUNTER — Other Ambulatory Visit: Payer: Self-pay | Admitting: Internal Medicine

## 2014-07-15 ENCOUNTER — Encounter: Payer: Self-pay | Admitting: Internal Medicine

## 2014-07-19 ENCOUNTER — Other Ambulatory Visit: Payer: Self-pay | Admitting: Internal Medicine

## 2014-07-19 DIAGNOSIS — M5416 Radiculopathy, lumbar region: Secondary | ICD-10-CM

## 2014-07-19 DIAGNOSIS — E118 Type 2 diabetes mellitus with unspecified complications: Secondary | ICD-10-CM

## 2014-07-19 DIAGNOSIS — E785 Hyperlipidemia, unspecified: Secondary | ICD-10-CM

## 2014-07-19 MED ORDER — HYDROCODONE-ACETAMINOPHEN 10-325 MG PO TABS: 1.0000 | ORAL_TABLET | Freq: Three times a day (TID) | ORAL | Status: DC | PRN

## 2014-07-19 NOTE — Telephone Encounter (Signed)
Place rx in cabinet for pick-up.../lmb 

## 2014-07-21 LAB — HM DIABETES EYE EXAM

## 2014-08-10 NOTE — Addendum Note (Signed)
Addended by: Janith Lima on: 08/10/2014 08:03 AM   Modules accepted: Miquel Dunn

## 2014-08-24 ENCOUNTER — Other Ambulatory Visit: Payer: Self-pay | Admitting: Internal Medicine

## 2014-08-24 ENCOUNTER — Encounter: Payer: Self-pay | Admitting: Internal Medicine

## 2014-08-24 DIAGNOSIS — E785 Hyperlipidemia, unspecified: Secondary | ICD-10-CM

## 2014-08-24 DIAGNOSIS — E118 Type 2 diabetes mellitus with unspecified complications: Secondary | ICD-10-CM

## 2014-08-24 DIAGNOSIS — M5416 Radiculopathy, lumbar region: Secondary | ICD-10-CM

## 2014-08-24 MED ORDER — HYDROCODONE-ACETAMINOPHEN 10-325 MG PO TABS: 1.0000 | ORAL_TABLET | Freq: Three times a day (TID) | ORAL | Status: DC | PRN

## 2014-09-09 ENCOUNTER — Other Ambulatory Visit: Payer: Self-pay | Admitting: Internal Medicine

## 2014-09-13 ENCOUNTER — Other Ambulatory Visit (INDEPENDENT_AMBULATORY_CARE_PROVIDER_SITE_OTHER): Payer: BLUE CROSS/BLUE SHIELD

## 2014-09-13 ENCOUNTER — Encounter: Payer: Self-pay | Admitting: Internal Medicine

## 2014-09-13 ENCOUNTER — Ambulatory Visit (INDEPENDENT_AMBULATORY_CARE_PROVIDER_SITE_OTHER): Payer: BLUE CROSS/BLUE SHIELD | Admitting: Internal Medicine

## 2014-09-13 VITALS — BP 108/64 | HR 79 | Temp 97.8°F | Resp 16 | Ht 63.0 in | Wt 161.0 lb

## 2014-09-13 DIAGNOSIS — N2889 Other specified disorders of kidney and ureter: Secondary | ICD-10-CM

## 2014-09-13 DIAGNOSIS — R7989 Other specified abnormal findings of blood chemistry: Secondary | ICD-10-CM

## 2014-09-13 DIAGNOSIS — I10 Essential (primary) hypertension: Secondary | ICD-10-CM

## 2014-09-13 DIAGNOSIS — Z8349 Family history of other endocrine, nutritional and metabolic diseases: Secondary | ICD-10-CM

## 2014-09-13 DIAGNOSIS — Z9081 Acquired absence of spleen: Secondary | ICD-10-CM

## 2014-09-13 DIAGNOSIS — E118 Type 2 diabetes mellitus with unspecified complications: Secondary | ICD-10-CM | POA: Diagnosis not present

## 2014-09-13 DIAGNOSIS — E785 Hyperlipidemia, unspecified: Secondary | ICD-10-CM

## 2014-09-13 DIAGNOSIS — D75838 Other thrombocytosis: Secondary | ICD-10-CM

## 2014-09-13 DIAGNOSIS — M5416 Radiculopathy, lumbar region: Secondary | ICD-10-CM

## 2014-09-13 DIAGNOSIS — Z23 Encounter for immunization: Secondary | ICD-10-CM

## 2014-09-13 DIAGNOSIS — M47817 Spondylosis without myelopathy or radiculopathy, lumbosacral region: Secondary | ICD-10-CM

## 2014-09-13 DIAGNOSIS — M5417 Radiculopathy, lumbosacral region: Secondary | ICD-10-CM

## 2014-09-13 LAB — URINALYSIS, ROUTINE W REFLEX MICROSCOPIC
Bilirubin Urine: NEGATIVE
HGB URINE DIPSTICK: NEGATIVE
Ketones, ur: NEGATIVE
Leukocytes, UA: NEGATIVE
Nitrite: NEGATIVE
RBC / HPF: NONE SEEN (ref 0–?)
Specific Gravity, Urine: >=1.03 — AB (ref 1.000–1.030)
Total Protein, Urine: NEGATIVE
UROBILINOGEN UA: 0.2 (ref 0.0–1.0)
Urine Glucose: >=1000 — AB
pH: 5.5 (ref 5.0–8.0)

## 2014-09-13 LAB — COMPREHENSIVE METABOLIC PANEL
ALT: 14 U/L (ref 0–35)
AST: 14 U/L (ref 0–37)
Albumin: 4.3 g/dL (ref 3.5–5.2)
Alkaline Phosphatase: 73 U/L (ref 39–117)
BUN: 21 mg/dL (ref 6–23)
CALCIUM: 9.8 mg/dL (ref 8.4–10.5)
CHLORIDE: 103 meq/L (ref 96–112)
CO2: 29 meq/L (ref 19–32)
CREATININE: 0.75 mg/dL (ref 0.40–1.20)
GFR: 82.61 mL/min (ref >60.00)
GLUCOSE: 83 mg/dL (ref 70–99)
Potassium: 4.4 mEq/L (ref 3.5–5.1)
Sodium: 139 mEq/L (ref 135–145)
Total Bilirubin: 0.3 mg/dL (ref 0.2–1.2)
Total Protein: 7.3 g/dL (ref 6.0–8.3)

## 2014-09-13 LAB — CBC WITH DIFFERENTIAL/PLATELET
BASOS ABS: 0.1 10*3/uL (ref 0.0–0.1)
Basophils Relative: 0.9 % (ref 0.0–3.0)
EOS ABS: 0.2 10*3/uL (ref 0.0–0.7)
Eosinophils Relative: 1.4 % (ref 0.0–5.0)
HCT: 42 % (ref 36.0–46.0)
HEMOGLOBIN: 13.8 g/dL (ref 12.0–15.0)
LYMPHS PCT: 36.4 % (ref 12.0–46.0)
Lymphs Abs: 5.3 10*3/uL — ABNORMAL HIGH (ref 0.7–4.0)
MCHC: 33 g/dL (ref 30.0–36.0)
MCV: 86.9 fl (ref 78.0–100.0)
Monocytes Absolute: 1.3 10*3/uL — ABNORMAL HIGH (ref 0.1–1.0)
Monocytes Relative: 8.8 % (ref 3.0–12.0)
NEUTROS ABS: 7.6 10*3/uL (ref 1.4–7.7)
NEUTROS PCT: 52.5 % (ref 43.0–77.0)
Platelets: 465 10*3/uL — ABNORMAL HIGH (ref 150.0–400.0)
RBC: 4.83 Mil/uL (ref 3.87–5.11)
RDW: 14.1 % (ref 11.5–15.5)
WBC: 14.4 10*3/uL — ABNORMAL HIGH (ref 4.0–10.5)

## 2014-09-13 LAB — MICROALBUMIN / CREATININE URINE RATIO
Creatinine,U: 131.9 mg/dL
MICROALB/CREAT RATIO: 1 mg/g (ref 0.0–30.0)
Microalb, Ur: 1.3 mg/dL (ref 0.0–1.9)

## 2014-09-13 LAB — HEMOGLOBIN A1C: Hgb A1c MFr Bld: 7.2 % — ABNORMAL HIGH (ref 4.6–6.5)

## 2014-09-13 LAB — FERRITIN: Ferritin: 27.4 ng/mL (ref 10.0–291.0)

## 2014-09-13 LAB — IBC PANEL
Iron: 73 ug/dL (ref 42–145)
Saturation Ratios: 17.6 % — ABNORMAL LOW (ref 20.0–50.0)
TRANSFERRIN: 296 mg/dL (ref 212.0–360.0)

## 2014-09-13 MED ORDER — HYDROCODONE-ACETAMINOPHEN 10-325 MG PO TABS: 1.0000 | ORAL_TABLET | Freq: Three times a day (TID) | ORAL | Status: DC | PRN

## 2014-09-13 NOTE — Assessment & Plan Note (Signed)
Her BP is well controlled Will monitor her lytes and renal fucntion

## 2014-09-13 NOTE — Assessment & Plan Note (Signed)
Will check a DNA screen for hemochromatosis as well as her iron stores

## 2014-09-13 NOTE — Patient Instructions (Signed)

## 2014-09-13 NOTE — Assessment & Plan Note (Signed)
Her blood sugars have been improving Will recheck her A1C today and will monitor her renal fucntion

## 2014-09-13 NOTE — Assessment & Plan Note (Signed)
She is getting adequate pain relief with norco, will cont She does not want to pursue any other diagnostic or treatment options at this time

## 2014-09-13 NOTE — Progress Notes (Signed)
Subjective:    Patient ID: Margaret Norman, female    DOB: [birth year], 64 y.o.   MRN: [id number]  HPI Comments: Her sister has been diagnosed with hemochromatosis, she wants to be tested.  Back Pain This is a chronic problem. The current episode started more than 1 year ago. The problem occurs intermittently. The problem is unchanged. The pain is present in the lumbar spine. The quality of the pain is described as aching and stabbing. The pain does not radiate. The pain is at a severity of 4/10. The pain is mild. The pain is worse during the day. The symptoms are aggravated by bending, position and standing. Pertinent negatives include no abdominal pain, bladder incontinence, bowel incontinence, chest pain, dysuria, fever, headaches, leg pain, numbness, paresis, paresthesias, pelvic pain, perianal numbness, tingling, weakness or weight loss. Risk factors include obesity and lack of exercise. She has tried analgesics and home exercises for the symptoms. The treatment provided significant relief.      Review of Systems  Constitutional: Negative.  Negative for fever, chills, weight loss, diaphoresis, appetite change and fatigue.  HENT: Negative.   Eyes: Negative.   Respiratory: Negative.  Negative for cough, choking, chest tightness, shortness of breath and stridor.   Cardiovascular: Negative.  Negative for chest pain, palpitations and leg swelling.  Gastrointestinal: Negative.  Negative for nausea, vomiting, abdominal pain, diarrhea, constipation, blood in stool and bowel incontinence.  Endocrine: Negative.  Negative for polydipsia, polyphagia and polyuria.  Genitourinary: Negative.  Negative for bladder incontinence, dysuria, difficulty urinating and pelvic pain.  Musculoskeletal: Positive for back pain. Negative for myalgias, joint swelling, arthralgias, gait problem, neck pain and neck stiffness.  Skin: Negative.   Allergic/Immunologic: Negative.   Neurological: Negative.  Negative for  tingling, weakness, numbness, headaches and paresthesias.  Hematological: Negative.  Negative for adenopathy. Does not bruise/bleed easily.  Psychiatric/Behavioral: Positive for sleep disturbance. Negative for suicidal ideas, self-injury, dysphoric mood, decreased concentration and agitation. The patient is nervous/anxious. The patient is not hyperactive.        Objective:   Physical Exam  Constitutional: She is oriented to person, place, and time. She appears well-developed and well-nourished. No distress.  HENT:  Head: Normocephalic and atraumatic.  Mouth/Throat: Oropharynx is clear and moist. No oropharyngeal exudate.  Eyes: Conjunctivae are normal. Right eye exhibits no discharge. Left eye exhibits no discharge. No scleral icterus.  Neck: Normal range of motion. Neck supple. No JVD present. No tracheal deviation present. No thyromegaly present.  Cardiovascular: Normal rate, regular rhythm, normal heart sounds and intact distal pulses.  Exam reveals no gallop and no friction rub.   No murmur heard. Pulmonary/Chest: Effort normal and breath sounds normal. No stridor. No respiratory distress. She has no wheezes. She has no rales. She exhibits no tenderness.  Abdominal: Soft. Bowel sounds are normal. She exhibits no distension and no mass. There is no tenderness. There is no rebound and no guarding.  Musculoskeletal: Normal range of motion. She exhibits no edema or tenderness.       Lumbar back: Normal. She exhibits normal range of motion, no tenderness, no bony tenderness, no swelling, no edema, no deformity, no laceration, no pain, no spasm and normal pulse.  Neg SLR in BLE  Lymphadenopathy:    She has no cervical adenopathy.  Neurological: She is alert and oriented to person, place, and time. She has normal reflexes. She displays normal reflexes. No cranial nerve deficit. She exhibits normal muscle tone. Coordination normal.  Skin: Skin is warm  and dry. No rash noted. She is not  diaphoretic. No erythema. No pallor.  Psychiatric: She has a normal mood and affect. Her behavior is normal. Judgment and thought content normal.  Vitals reviewed.   Lab Results  Component Value Date   WBC 17.6* 05/24/2014   HGB 12.6 05/24/2014   HCT 39.1 05/24/2014   PLT 455.0* 05/24/2014   GLUCOSE 87 05/24/2014   GLUCOSE 87 05/24/2014   CHOL 154 05/24/2014   TRIG 115.0 05/24/2014   HDL 44.40 05/24/2014   LDLDIRECT 137.9 02/04/2013   LDLCALC 87 05/24/2014   ALT 11 05/24/2014   AST 14 05/24/2014   NA 137 05/24/2014   NA 137 05/24/2014   K 3.6 05/24/2014   K 3.6 05/24/2014   CL 105 05/24/2014   CL 105 05/24/2014   CREATININE 0.6 05/24/2014   CREATININE 0.6 05/24/2014   BUN 17 05/24/2014   BUN 17 05/24/2014   CO2 22 05/24/2014   CO2 22 05/24/2014   TSH 0.92 05/24/2014   HGBA1C 7.6* 05/24/2014   MICROALBUR 0.5 11/15/2008        Assessment & Plan:

## 2014-09-13 NOTE — Progress Notes (Signed)
Pre visit review using our clinic review tool, if applicable. No additional management support is needed unless otherwise documented below in the visit note. 

## 2014-09-13 NOTE — Assessment & Plan Note (Signed)
She has no s/s related to this Will recheck her CBC today

## 2014-09-16 ENCOUNTER — Encounter: Payer: Self-pay | Admitting: Internal Medicine

## 2014-09-16 LAB — HEMOCHROMATOSIS DNA-PCR(C282Y,H63D)

## 2014-09-22 ENCOUNTER — Ambulatory Visit: Payer: BC Managed Care – PPO | Admitting: Internal Medicine

## 2014-10-26 ENCOUNTER — Encounter: Payer: Self-pay | Admitting: Internal Medicine

## 2014-10-27 ENCOUNTER — Other Ambulatory Visit: Payer: Self-pay | Admitting: Internal Medicine

## 2014-10-27 DIAGNOSIS — M5416 Radiculopathy, lumbar region: Secondary | ICD-10-CM

## 2014-10-27 MED ORDER — HYDROCODONE-ACETAMINOPHEN 10-325 MG PO TABS: 1.0000 | ORAL_TABLET | Freq: Three times a day (TID) | ORAL | Status: DC | PRN

## 2014-11-02 ENCOUNTER — Encounter: Payer: Self-pay | Admitting: Gastroenterology

## 2014-11-02 ENCOUNTER — Other Ambulatory Visit: Payer: Self-pay | Admitting: Internal Medicine

## 2014-11-18 ENCOUNTER — Other Ambulatory Visit: Payer: Self-pay | Admitting: Internal Medicine

## 2014-11-18 ENCOUNTER — Other Ambulatory Visit: Payer: Self-pay | Admitting: Physical Medicine & Rehabilitation

## 2014-12-13 ENCOUNTER — Encounter: Payer: Self-pay | Admitting: Internal Medicine

## 2014-12-13 ENCOUNTER — Other Ambulatory Visit: Payer: Self-pay | Admitting: Internal Medicine

## 2014-12-13 DIAGNOSIS — M5416 Radiculopathy, lumbar region: Secondary | ICD-10-CM

## 2014-12-13 MED ORDER — HYDROCODONE-ACETAMINOPHEN 10-325 MG PO TABS: 1.0000 | ORAL_TABLET | Freq: Three times a day (TID) | ORAL | Status: DC | PRN

## 2014-12-23 ENCOUNTER — Encounter: Payer: Self-pay | Admitting: Internal Medicine

## 2014-12-23 ENCOUNTER — Other Ambulatory Visit (INDEPENDENT_AMBULATORY_CARE_PROVIDER_SITE_OTHER): Payer: BLUE CROSS/BLUE SHIELD

## 2014-12-23 ENCOUNTER — Ambulatory Visit (INDEPENDENT_AMBULATORY_CARE_PROVIDER_SITE_OTHER): Payer: BLUE CROSS/BLUE SHIELD | Admitting: Internal Medicine

## 2014-12-23 VITALS — BP 130/70 | HR 79 | Temp 97.9°F | Resp 16 | Ht 63.0 in | Wt 163.8 lb

## 2014-12-23 DIAGNOSIS — Z23 Encounter for immunization: Secondary | ICD-10-CM

## 2014-12-23 DIAGNOSIS — Z9081 Acquired absence of spleen: Secondary | ICD-10-CM

## 2014-12-23 DIAGNOSIS — D75838 Other thrombocytosis: Secondary | ICD-10-CM

## 2014-12-23 DIAGNOSIS — M5416 Radiculopathy, lumbar region: Secondary | ICD-10-CM

## 2014-12-23 DIAGNOSIS — M5417 Radiculopathy, lumbosacral region: Secondary | ICD-10-CM | POA: Diagnosis not present

## 2014-12-23 DIAGNOSIS — R7989 Other specified abnormal findings of blood chemistry: Secondary | ICD-10-CM

## 2014-12-23 DIAGNOSIS — I10 Essential (primary) hypertension: Secondary | ICD-10-CM

## 2014-12-23 DIAGNOSIS — E118 Type 2 diabetes mellitus with unspecified complications: Secondary | ICD-10-CM

## 2014-12-23 LAB — CBC WITH DIFFERENTIAL/PLATELET
Basophils Absolute: 0.1 10*3/uL (ref 0.0–0.1)
Basophils Relative: 0.4 % (ref 0.0–3.0)
EOS ABS: 0.3 10*3/uL (ref 0.0–0.7)
EOS PCT: 2.1 % (ref 0.0–5.0)
HCT: 41.4 % (ref 36.0–46.0)
Hemoglobin: 13.6 g/dL (ref 12.0–15.0)
Lymphocytes Relative: 37.9 % (ref 12.0–46.0)
Lymphs Abs: 5.4 10*3/uL — ABNORMAL HIGH (ref 0.7–4.0)
MCHC: 32.8 g/dL (ref 30.0–36.0)
MCV: 88.5 fl (ref 78.0–100.0)
MONO ABS: 1.8 10*3/uL — AB (ref 0.1–1.0)
Monocytes Relative: 12.8 % — ABNORMAL HIGH (ref 3.0–12.0)
Neutro Abs: 6.7 10*3/uL (ref 1.4–7.7)
Neutrophils Relative %: 46.8 % (ref 43.0–77.0)
PLATELETS: 479 10*3/uL — AB (ref 150.0–400.0)
RBC: 4.68 Mil/uL (ref 3.87–5.11)
RDW: 14.1 % (ref 11.5–15.5)
WBC: 14.3 10*3/uL — AB (ref 4.0–10.5)

## 2014-12-23 LAB — BASIC METABOLIC PANEL
BUN: 23 mg/dL (ref 6–23)
CO2: 31 meq/L (ref 19–32)
Calcium: 10 mg/dL (ref 8.4–10.5)
Chloride: 101 mEq/L (ref 96–112)
Creatinine, Ser: 0.99 mg/dL (ref 0.40–1.20)
GFR: 59.91 mL/min — ABNORMAL LOW (ref >60.00)
GLUCOSE: 105 mg/dL — AB (ref 70–99)
Potassium: 4.8 mEq/L (ref 3.5–5.1)
Sodium: 140 mEq/L (ref 135–145)

## 2014-12-23 LAB — HEMOGLOBIN A1C: HEMOGLOBIN A1C: 6.5 % (ref 4.6–6.5)

## 2014-12-23 NOTE — Progress Notes (Signed)
Pre visit review using our clinic review tool, if applicable. No additional management support is needed unless otherwise documented below in the visit note. 

## 2014-12-23 NOTE — Patient Instructions (Signed)
Back Pain, Adult Low back pain is very common. About 1 in 5 people have back pain.The cause of low back pain is rarely dangerous. The pain often gets better over time.About half of people with a sudden onset of back pain feel better in just 2 weeks. About 8 in 10 people feel better by 6 weeks.  CAUSES Some common causes of back pain include:  Strain of the muscles or ligaments supporting the spine.  Wear and tear (degeneration) of the spinal discs.  Arthritis.  Direct injury to the back. DIAGNOSIS Most of the time, the direct cause of low back pain is not known.However, back pain can be treated effectively even when the exact cause of the pain is unknown.Answering your caregiver's questions about your overall health and symptoms is one of the most accurate ways to make sure the cause of your pain is not dangerous. If your caregiver needs more information, he or she may order lab work or imaging tests (X-rays or MRIs).However, even if imaging tests show changes in your back, this usually does not require surgery. HOME CARE INSTRUCTIONS For many people, back pain returns.Since low back pain is rarely dangerous, it is often a condition that people can learn to manageon their own.   Remain active. It is stressful on the back to sit or stand in one place. Do not sit, drive, or stand in one place for more than 30 minutes at a time. Take short walks on level surfaces as soon as pain allows.Try to increase the length of time you walk each day.  Do not stay in bed.Resting more than 1 or 2 days can delay your recovery.  Do not avoid exercise or work.Your body is made to move.It is not dangerous to be active, even though your back may hurt.Your back will likely heal faster if you return to being active before your pain is gone.  Pay attention to your body when you bend and lift. Many people have less discomfortwhen lifting if they bend their knees, keep the load close to their bodies,and  avoid twisting. Often, the most comfortable positions are those that put less stress on your recovering back.  Find a comfortable position to sleep. Use a firm mattress and lie on your side with your knees slightly bent. If you lie on your back, put a pillow under your knees.  Only take over-the-counter or prescription medicines as directed by your caregiver. Over-the-counter medicines to reduce pain and inflammation are often the most helpful.Your caregiver may prescribe muscle relaxant drugs.These medicines help dull your pain so you can more quickly return to your normal activities and healthy exercise.  Put ice on the injured area.  Put ice in a plastic bag.  Place a towel between your skin and the bag.  Leave the ice on for 15-20 minutes, 03-04 times a day for the first 2 to 3 days. After that, ice and heat may be alternated to reduce pain and spasms.  Ask your caregiver about trying back exercises and gentle massage. This may be of some benefit.  Avoid feeling anxious or stressed.Stress increases muscle tension and can worsen back pain.It is important to recognize when you are anxious or stressed and learn ways to manage it.Exercise is a great option. SEEK MEDICAL CARE IF:  You have pain that is not relieved with rest or medicine.  You have pain that does not improve in 1 week.  You have new symptoms.  You are generally not feeling well. SEEK   IMMEDIATE MEDICAL CARE IF:   You have pain that radiates from your back into your legs.  You develop new bowel or bladder control problems.  You have unusual weakness or numbness in your arms or legs.  You develop nausea or vomiting.  You develop abdominal pain.  You feel faint. Document Released: 06/11/2005 Document Revised: 12/11/2011 Document Reviewed: 10/13/2013 [employer] Patient Information 2015 [employer]. This information is not intended to replace advice given to you by your health care provider. Make sure you  discuss any questions you have with your health care provider.  

## 2014-12-23 NOTE — Progress Notes (Signed)
Subjective:  Patient ID: Margaret Norman, female    DOB: 1950-11-13  Age: 64 y.o. MRN: [id number]  CC: Back Pain; Diabetes; and Hypertension   HPI [name] presents for follow up on multiple medical problems, she has chronic LBP that she describes as intermittent stabbing that occasionally radiates into there right thigh but she denies any N/W/T in her LE's. She gets relief from the pain with norco.  Outpatient Prescriptions Prior to Visit  Medication Sig Dispense Refill  . aspirin 81 MG EC tablet Take 81 mg by mouth daily.      . B-D ULTRAFINE III SHORT PEN 31G X 8 MM MISC USE DAILY WITH LANTUS INSULIN 1 each 2  . BENICAR 40 MG tablet TAKE 1 TABLET DAILY 90 tablet 2  . buPROPion (WELLBUTRIN XL) 150 MG 24 hr tablet TAKE 1 TABLET EVERY MORNING 90 tablet 2  . docusate sodium (COLACE) 100 MG capsule Take 100 mg by mouth daily.    . Doxepin HCl (SILENOR) 6 MG TABS Take 6 mg by mouth at bedtime as needed. For sleep    . glucose blood (FREESTYLE LITE) test strip Test three times daily as directed 300 each 3  . HYDROcodone-acetaminophen (NORCO) 10-325 MG per tablet Take 1 tablet by mouth every 8 (eight) hours as needed. 90 tablet 0  . insulin glargine (LANTUS) 100 UNIT/ML injection Inject 30 Units into the skin at bedtime.    . INVOKANA 300 MG TABS TAKE 1 TABLET DAILY 90 tablet 2  . Lancets (FREESTYLE) lancets Test three times daily as directed 300 each 3  . lansoprazole (PREVACID) 30 MG capsule Take 1 capsule (30 mg total) by mouth daily at 12 noon. 90 capsule 3  . LANTUS SOLOSTAR 100 UNIT/ML Solostar Pen INJECT 30 UNITS INTO THE SKIN AT BEDTIME 9 mL 3  . metFORMIN (GLUCOPHAGE) 1000 MG tablet Take 1,000 mg by mouth 2 (two) times daily with a meal.    . Multiple Vitamin (MULTIVITAMIN) capsule Take 1 capsule by mouth daily.    . pioglitazone (ACTOS) 30 MG tablet TAKE 1 TABLET DAILY 90 tablet 2  . Probiotic Product (PHILLIPS COLON HEALTH PO) Take 1,000 mg by mouth every evening.    .  Pyridoxine HCl (VITAMIN B-6 PO) Take 1 tablet by mouth daily.     . sertraline (ZOLOFT) 50 MG tablet TAKE 1 TABLET ONCE DAILY 90 tablet 2  . TRULICITY 1.5 OX/7.3ZH SOPN INJECT 1 PEN INTO THE SKIN ONCE A WEEK 12 pen 2  . Olmesartan Medoxomil (BENICAR PO) Take by mouth daily.    . Cyanocobalamin (VITAMIN B12 PO) Take 2,500 mcg by mouth daily.    . metFORMIN (GLUCOPHAGE) 1000 MG tablet TAKE 1 TABLET TWICE A DAY (Patient not taking: Reported on 12/23/2014) 180 tablet 2   No facility-administered medications prior to visit.    ROS Review of Systems  Constitutional: Negative.  Negative for fever, chills, diaphoresis, appetite change and fatigue.  HENT: Negative.   Eyes: Negative.   Respiratory: Negative.  Negative for cough, choking, chest tightness, shortness of breath and stridor.   Cardiovascular: Negative.  Negative for chest pain, palpitations and leg swelling.  Gastrointestinal: Negative.  Negative for nausea, vomiting, abdominal pain, diarrhea, constipation and blood in stool.  Endocrine: Negative.  Negative for polydipsia, polyphagia and polyuria.  Genitourinary: Negative.   Musculoskeletal: Positive for back pain. Negative for myalgias, joint swelling, arthralgias, neck pain and neck stiffness.  Skin: Negative.  Negative for rash.  Allergic/Immunologic: Negative.  Neurological: Negative.  Negative for dizziness, syncope, speech difficulty, weakness, light-headedness, numbness and headaches.  Hematological: Negative.   Psychiatric/Behavioral: Negative.     Objective:  BP 130/20 mmHg  Pulse 79  Temp(Src) 97.9 F (36.6 C) (Oral)  Ht 5\' 3"  (1.6 m)  Wt 163 lb 12 oz (74.277 kg)  BMI 29.01 kg/m2  SpO2 97%  BP Readings from Last 3 Encounters:  12/23/14 130/20  09/13/14 108/64  05/24/14 130/86    Wt Readings from Last 3 Encounters:  12/23/14 163 lb 12 oz (74.277 kg)  09/13/14 161 lb (73.029 kg)  05/24/14 158 lb (71.668 kg)    Physical Exam  Constitutional: She is oriented  to person, place, and time. She appears well-developed and well-nourished. No distress.  HENT:  Head: Normocephalic and atraumatic.  Mouth/Throat: No oropharyngeal exudate.  Eyes: Conjunctivae are normal. Right eye exhibits no discharge. Left eye exhibits no discharge. No scleral icterus.  Neck: Normal range of motion. Neck supple. No JVD present. No tracheal deviation present. No thyromegaly present.  Cardiovascular: Normal rate, regular rhythm, normal heart sounds and intact distal pulses.  Exam reveals no gallop and no friction rub.   No murmur heard. Pulmonary/Chest: Effort normal and breath sounds normal. No stridor. No respiratory distress. She has no wheezes. She has no rales. She exhibits no tenderness.  Abdominal: Soft. Bowel sounds are normal. She exhibits no distension and no mass. There is no tenderness. There is no rebound and no guarding.  Musculoskeletal: Normal range of motion. She exhibits no edema or tenderness.  Lymphadenopathy:    She has no cervical adenopathy.  Neurological: She is alert and oriented to person, place, and time. She displays no atrophy, no tremor and normal reflexes. No cranial nerve deficit or sensory deficit. She exhibits normal muscle tone. She displays a negative Romberg sign. She displays no seizure activity. Coordination and gait normal.  Reflex Scores:      Tricep reflexes are 1+ on the right side.      Bicep reflexes are 1+ on the right side and 1+ on the left side.      Brachioradialis reflexes are 1+ on the right side and 1+ on the left side.      Patellar reflexes are 1+ on the right side and 1+ on the left side.      Achilles reflexes are 1+ on the right side and 1+ on the left side. Neg SLR in BLE  Skin: Skin is warm and dry. No rash noted. She is not diaphoretic. No erythema. No pallor.  Vitals reviewed.   Lab Results  Component Value Date   WBC 14.3* 12/23/2014   HGB 13.6 12/23/2014   HCT 41.4 12/23/2014   PLT 479.0* 12/23/2014    GLUCOSE 105* 12/23/2014   CHOL 154 05/24/2014   TRIG 115.0 05/24/2014   HDL 44.40 05/24/2014   LDLDIRECT 137.9 02/04/2013   LDLCALC 87 05/24/2014   ALT 14 09/13/2014   AST 14 09/13/2014   NA 140 12/23/2014   K 4.8 12/23/2014   CL 101 12/23/2014   CREATININE 0.99 12/23/2014   BUN 23 12/23/2014   CO2 31 12/23/2014   TSH 0.92 05/24/2014   HGBA1C 6.5 12/23/2014   MICROALBUR 1.3 09/13/2014    Ct Abdomen Pelvis W Contrast  01/21/2014   CLINICAL DATA:  Abdominal pain  EXAM: CT ABDOMEN AND PELVIS WITH CONTRAST  TECHNIQUE: Multidetector CT imaging of the abdomen and pelvis was performed using the standard protocol following bolus administration of intravenous  contrast. Oral contrast was also administered  CONTRAST:  116mL OMNIPAQUE IOHEXOL 300 MG/ML  SOLN  COMPARISON:  January 09, 2010  FINDINGS: Lung bases are clear.  Liver is prominent, measuring 17.2 cm in length. No focal liver lesions are appreciable. Gallbladder is absent. There is no appreciable biliary duct dilatation.  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 A portion of the head remains as does the uncinate process. There is no pancreatic abnormality in the regions of the pancreas which remain. This appearance is stable compared to the prior study.  Adrenals appear normal. There is a simple cyst in the medial mid right kidney measuring 1.4 by 1.2 cm. There is a simple cyst in the lateral right mid kidney measuring 1 x 1 cm. There is a mass arising from the lateral mid left kidney measuring 1 x 1 cm which has attenuation values consistent with a cyst. There is a mass arising from the upper pole of the left kidney anteriorly measuring 8 x 7 mm which has attenuation values higher than is expected with a simple cyst. There is a mass arising from the lower pole left kidney measuring 1.4 x 1.1 cm which has attenuation values higher than simple cyst which has increased slightly in size  compared to the prior study. There is no hydronephrosis on either side. There is no renal or ureteral calculus on either side.  Since the prior study, the patient has undergone ventral hernia repair. There is currently no appreciable ventral hernia.  In the pelvis, the urinary bladder is midline with normal wall thickness. There are sigmoid diverticula without diverticulitis. There is no pelvic mass or fluid collection. Appendix appears normal. There is no bowel obstruction. There is no free air or portal venous air. There is moderate stool throughout colon diffusely.  There is no ascites, adenopathy, or abscess in the abdomen or pelvis. There is no evidence of abdominal aortic aneurysm. There is degenerative change in the lumbar spine. There are no blastic or lytic bone lesions.  IMPRESSION: There is a small mass arising from the lower pole left kidney which has attenuation values slightly higher than simple cyst. This mass has become slightly larger compared to the previous study. Given this circumstance, further evaluation is warranted. Further evaluation with pre and post contrast MRI or CT should be considered. MRI is preferred in younger patients (due to lack of ionizing radiation) and for evaluating calcified lesion(s).  Spleen is absent.  Patient has had a partial pancreatectomy, stable.  Liver is prominent but otherwise appears unremarkable.  Sigmoid diverticula without diverticulitis. No bowel obstruction. No abscess. Appendix appears normal. The patient has undergone ventral hernia repair; previously noted ventral hernia is no longer appreciable. There is some scarring in the anterior abdominal wall consistent with previous surgery for this hernia.   Electronically Signed   By: Lowella Grip M.D.   On: 01/21/2014 17:44    Assessment & Plan:   Milliana was seen today for back pain, diabetes and hypertension.  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 Macdonnell's Olmesartan Medoxomil (BENICAR PO). I am also having her maintain her aspirin, freestyle, glucose blood, Doxepin HCl, multivitamin, Cyanocobalamin (VITAMIN B12 PO), Probiotic Product ([place]), Pyridoxine HCl (VITAMIN B-6 PO), docusate sodium, insulin glargine, metFORMIN, INVOKANA, sertraline, lansoprazole, pioglitazone, B-D ULTRAFINE III SHORT PEN, buPROPion, LANTUS SOLOSTAR, TRULICITY, BENICAR, and HYDROcodone-acetaminophen.  No orders of the defined types were placed in this encounter.     Follow-up: Return in about 4 months (around 04/24/2015).  Scarlette Calico, MD

## 2014-12-27 ENCOUNTER — Encounter: Payer: Self-pay | Admitting: Internal Medicine

## 2014-12-27 NOTE — Assessment & Plan Note (Signed)
She has chronic/unchanged LBP but no radiculopathy on exam She is not willing to do PT or to consider any surgery for this She has seen pain management before and is not willing to return for further evaluation She does not agree to any additional xrays at this time to see if there has been a change Will cont norco as needed for the pain

## 2015-01-05 ENCOUNTER — Other Ambulatory Visit: Payer: Self-pay

## 2015-01-05 MED ORDER — CANAGLIFLOZIN 300 MG PO TABS: 300.0000 mg | ORAL_TABLET | Freq: Every day | ORAL | Status: DC

## 2015-01-11 ENCOUNTER — Encounter: Payer: Self-pay | Admitting: Internal Medicine

## 2015-01-12 MED ORDER — ONETOUCH LANCETS MISC: Status: DC

## 2015-01-12 MED ORDER — GLUCOSE BLOOD VI STRP: ORAL_STRIP | Status: DC

## 2015-01-12 MED ORDER — ONETOUCH VERIO FLEX SYSTEM W/DEVICE KIT: 1.0000 | PACK | Freq: Three times a day (TID) | Status: DC

## 2015-01-12 NOTE — Telephone Encounter (Signed)
Pt called and also said that her new ins is needing a PA for TRULICITY 1.5 ZO/1.0RU SOPN [[id number]] .  Did you rec a PA for this med?    Freestyle light (bcbs does not cover this) 1 touch or bayer contour (they will cover these 2)  She wanted to know if we happen to have either on of those meters she could have?

## 2015-01-12 NOTE — Telephone Encounter (Signed)
Received pharmacy rejection stating that insurance will not cover Trulicity  without a prior authorization. PA submitted to insurance via covermymeds, approval now pending their decision.  Patient notified, see mychart message.

## 2015-01-14 ENCOUNTER — Encounter: Payer: Self-pay | Admitting: Internal Medicine

## 2015-01-16 ENCOUNTER — Other Ambulatory Visit: Payer: Self-pay | Admitting: Internal Medicine

## 2015-01-16 DIAGNOSIS — M5416 Radiculopathy, lumbar region: Secondary | ICD-10-CM

## 2015-01-16 MED ORDER — HYDROCODONE-ACETAMINOPHEN 10-325 MG PO TABS: 1.0000 | ORAL_TABLET | Freq: Three times a day (TID) | ORAL | Status: DC | PRN

## 2015-01-24 ENCOUNTER — Other Ambulatory Visit: Payer: Self-pay | Admitting: Obstetrics and Gynecology

## 2015-01-25 LAB — CYTOLOGY - PAP

## 2015-01-31 ENCOUNTER — Encounter: Payer: Self-pay | Admitting: Internal Medicine

## 2015-02-21 ENCOUNTER — Telehealth: Payer: Self-pay | Admitting: Internal Medicine

## 2015-02-21 ENCOUNTER — Other Ambulatory Visit: Payer: Self-pay

## 2015-02-21 DIAGNOSIS — M5416 Radiculopathy, lumbar region: Secondary | ICD-10-CM

## 2015-02-21 MED ORDER — HYDROCODONE-ACETAMINOPHEN 10-325 MG PO TABS: 1.0000 | ORAL_TABLET | Freq: Three times a day (TID) | ORAL | Status: DC | PRN

## 2015-02-21 NOTE — Telephone Encounter (Signed)
Pt informed of script at front

## 2015-02-21 NOTE — Telephone Encounter (Signed)
Pt came by office requesting a refill on Hydrocodone. °

## 2015-02-21 NOTE — Telephone Encounter (Signed)
Pt came in [facility] requesting RF. Please advise.

## 2015-02-21 NOTE — Telephone Encounter (Signed)
Printed and signed.  

## 2015-03-09 ENCOUNTER — Encounter: Payer: Self-pay | Admitting: Internal Medicine

## 2015-03-09 ENCOUNTER — Other Ambulatory Visit: Payer: Self-pay | Admitting: Internal Medicine

## 2015-03-09 DIAGNOSIS — I10 Essential (primary) hypertension: Secondary | ICD-10-CM

## 2015-03-09 DIAGNOSIS — E118 Type 2 diabetes mellitus with unspecified complications: Secondary | ICD-10-CM

## 2015-03-09 MED ORDER — VALSARTAN 320 MG PO TABS: 320.0000 mg | ORAL_TABLET | Freq: Every day | ORAL | Status: DC

## 2015-04-01 ENCOUNTER — Encounter: Payer: Self-pay | Admitting: Internal Medicine

## 2015-04-01 DIAGNOSIS — M5416 Radiculopathy, lumbar region: Secondary | ICD-10-CM

## 2015-04-04 ENCOUNTER — Other Ambulatory Visit: Payer: Self-pay | Admitting: Internal Medicine

## 2015-04-04 MED ORDER — HYDROCODONE-ACETAMINOPHEN 10-325 MG PO TABS: 1.0000 | ORAL_TABLET | Freq: Three times a day (TID) | ORAL | Status: DC | PRN

## 2015-04-04 NOTE — Telephone Encounter (Signed)
Pt requesting rf on pending med.

## 2015-04-26 ENCOUNTER — Encounter: Payer: Self-pay | Admitting: Internal Medicine

## 2015-04-26 ENCOUNTER — Ambulatory Visit (INDEPENDENT_AMBULATORY_CARE_PROVIDER_SITE_OTHER): Payer: BLUE CROSS/BLUE SHIELD | Admitting: Internal Medicine

## 2015-04-26 ENCOUNTER — Other Ambulatory Visit (INDEPENDENT_AMBULATORY_CARE_PROVIDER_SITE_OTHER): Payer: Medicare Other

## 2015-04-26 VITALS — BP 126/88 | HR 88 | Temp 97.9°F | Resp 16 | Ht 63.0 in | Wt 163.0 lb

## 2015-04-26 DIAGNOSIS — E118 Type 2 diabetes mellitus with unspecified complications: Secondary | ICD-10-CM | POA: Diagnosis not present

## 2015-04-26 DIAGNOSIS — K439 Ventral hernia without obstruction or gangrene: Secondary | ICD-10-CM | POA: Insufficient documentation

## 2015-04-26 DIAGNOSIS — Z0000 Encounter for general adult medical examination without abnormal findings: Secondary | ICD-10-CM

## 2015-04-26 DIAGNOSIS — Z1231 Encounter for screening mammogram for malignant neoplasm of breast: Secondary | ICD-10-CM

## 2015-04-26 DIAGNOSIS — E785 Hyperlipidemia, unspecified: Secondary | ICD-10-CM

## 2015-04-26 DIAGNOSIS — K219 Gastro-esophageal reflux disease without esophagitis: Secondary | ICD-10-CM

## 2015-04-26 DIAGNOSIS — D72829 Elevated white blood cell count, unspecified: Secondary | ICD-10-CM

## 2015-04-26 DIAGNOSIS — Z1159 Encounter for screening for other viral diseases: Secondary | ICD-10-CM | POA: Diagnosis not present

## 2015-04-26 DIAGNOSIS — I10 Essential (primary) hypertension: Secondary | ICD-10-CM

## 2015-04-26 DIAGNOSIS — Z23 Encounter for immunization: Secondary | ICD-10-CM

## 2015-04-26 LAB — CBC WITH DIFFERENTIAL/PLATELET
Basophils Absolute: 0.1 10*3/uL (ref 0.0–0.1)
Basophils Relative: 0.9 % (ref 0.0–3.0)
EOS ABS: 0.2 10*3/uL (ref 0.0–0.7)
EOS PCT: 1.5 % (ref 0.0–5.0)
HEMATOCRIT: 40.4 % (ref 36.0–46.0)
HEMOGLOBIN: 13.1 g/dL (ref 12.0–15.0)
LYMPHS PCT: 34.2 % (ref 12.0–46.0)
Lymphs Abs: 4.5 10*3/uL — ABNORMAL HIGH (ref 0.7–4.0)
MCHC: 32.4 g/dL (ref 30.0–36.0)
MCV: 89.1 fl (ref 78.0–100.0)
MONO ABS: 1.3 10*3/uL — AB (ref 0.1–1.0)
Monocytes Relative: 9.8 % (ref 3.0–12.0)
Neutro Abs: 7 10*3/uL (ref 1.4–7.7)
Neutrophils Relative %: 53.6 % (ref 43.0–77.0)
Platelets: 426 10*3/uL — ABNORMAL HIGH (ref 150.0–400.0)
RBC: 4.53 Mil/uL (ref 3.87–5.11)
RDW: 13.7 % (ref 11.5–15.5)
WBC: 13.1 10*3/uL — AB (ref 4.0–10.5)

## 2015-04-26 LAB — LIPID PANEL
Cholesterol: 178 mg/dL (ref 0–200)
HDL: 55.7 mg/dL (ref >39.00)
LDL Cholesterol: 100 mg/dL — ABNORMAL HIGH (ref 0–99)
NonHDL: 122.7
TRIGLYCERIDES: 116 mg/dL (ref 0.0–149.0)
Total CHOL/HDL Ratio: 3
VLDL: 23.2 mg/dL (ref 0.0–40.0)

## 2015-04-26 LAB — HEMOGLOBIN A1C: HEMOGLOBIN A1C: 6.5 % (ref 4.6–6.5)

## 2015-04-26 LAB — COMPREHENSIVE METABOLIC PANEL
ALBUMIN: 4.3 g/dL (ref 3.5–5.2)
ALK PHOS: 68 U/L (ref 39–117)
ALT: 12 U/L (ref 0–35)
AST: 14 U/L (ref 0–37)
BILIRUBIN TOTAL: 0.3 mg/dL (ref 0.2–1.2)
BUN: 18 mg/dL (ref 6–23)
CALCIUM: 10.1 mg/dL (ref 8.4–10.5)
CO2: 29 mEq/L (ref 19–32)
Chloride: 103 mEq/L (ref 96–112)
Creatinine, Ser: 0.81 mg/dL (ref 0.40–1.20)
GFR: 75.44 mL/min (ref >60.00)
Glucose, Bld: 102 mg/dL — ABNORMAL HIGH (ref 70–99)
Potassium: 4.4 mEq/L (ref 3.5–5.1)
Sodium: 141 mEq/L (ref 135–145)
TOTAL PROTEIN: 7.3 g/dL (ref 6.0–8.3)

## 2015-04-26 LAB — URINALYSIS, ROUTINE W REFLEX MICROSCOPIC
Bilirubin Urine: NEGATIVE
Ketones, ur: NEGATIVE
Nitrite: POSITIVE — AB
PH: 5.5 (ref 5.0–8.0)
Specific Gravity, Urine: >=1.03 — AB (ref 1.000–1.030)
TOTAL PROTEIN, URINE-UPE24: NEGATIVE
UROBILINOGEN UA: 0.2 (ref 0.0–1.0)
Urine Glucose: >=1000 — AB

## 2015-04-26 LAB — HEPATITIS C ANTIBODY: HCV AB: NEGATIVE

## 2015-04-26 LAB — TSH: TSH: 2.47 u[IU]/mL (ref 0.35–4.50)

## 2015-04-26 MED ORDER — OMEPRAZOLE 40 MG PO CPDR: 40.0000 mg | DELAYED_RELEASE_CAPSULE | Freq: Every day | ORAL | Status: DC

## 2015-04-26 NOTE — Patient Instructions (Signed)
Preventive Care for Adults, Female A healthy lifestyle and preventive care can promote health and wellness. Preventive health guidelines for women include the following key practices.  A routine yearly physical is a good way to check with your health care provider about your health and preventive screening. It is a chance to share any concerns and updates on your health and to receive a thorough exam.  Visit your dentist for a routine exam and preventive care every 6 months. Brush your teeth twice a day and floss once a day. Good oral hygiene prevents tooth decay and gum disease.  The frequency of eye exams is based on your age, health, family medical history, use of contact lenses, and other factors. Follow your health care provider's recommendations for frequency of eye exams.  Eat a healthy diet. Foods like vegetables, fruits, whole grains, low-fat dairy products, and lean protein foods contain the nutrients you need without too many calories. Decrease your intake of foods high in solid fats, added sugars, and salt. Eat the right amount of calories for you.Get information about a proper diet from your health care provider, if necessary.  Regular physical exercise is one of the most important things you can do for your health. Most adults should get at least 150 minutes of moderate-intensity exercise (any activity that increases your heart rate and causes you to sweat) each week. In addition, most adults need muscle-strengthening exercises on 2 or more days a week.  Maintain a healthy weight. The body mass index (BMI) is a screening tool to identify possible weight problems. It provides an estimate of body fat based on height and weight. Your health care provider can find your BMI and can help you achieve or maintain a healthy weight.For adults 20 years and older:  A BMI below 18.5 is considered underweight.  A BMI of 18.5 to 24.9 is normal.  A BMI of 25 to 29.9 is considered overweight.  A  BMI of 30 and above is considered obese.  Maintain normal blood lipids and cholesterol levels by exercising and minimizing your intake of saturated fat. Eat a balanced diet with plenty of fruit and vegetables. Blood tests for lipids and cholesterol should begin at age 45 and be repeated every 5 years. If your lipid or cholesterol levels are high, you are over 50, or you are at high risk for heart disease, you may need your cholesterol levels checked more frequently.Ongoing high lipid and cholesterol levels should be treated with medicines if diet and exercise are not working.  If you smoke, find out from your health care provider how to quit. If you do not use tobacco, do not start.  Lung cancer screening is recommended for adults aged 45-80 years who are at high risk for developing lung cancer because of a history of smoking. A yearly low-dose CT scan of the lungs is recommended for people who have at least a 30-pack-year history of smoking and are a current smoker or have quit within the past 15 years. A pack year of smoking is smoking an average of 1 pack of cigarettes a day for 1 year (for example: 1 pack a day for 30 years or 2 packs a day for 15 years). Yearly screening should continue until the smoker has stopped smoking for at least 15 years. Yearly screening should be stopped for people who develop a health problem that would prevent them from having lung cancer treatment.  If you are pregnant, do not drink alcohol. If you are  breastfeeding, be very cautious about drinking alcohol. If you are not pregnant and choose to drink alcohol, do not have more than 1 drink per day. One drink is considered to be 12 ounces (355 mL) of beer, 5 ounces (148 mL) of wine, or 1.5 ounces (44 mL) of liquor.  Avoid use of street drugs. Do not share needles with anyone. Ask for help if you need support or instructions about stopping the use of drugs.  High blood pressure causes heart disease and increases the risk  of stroke. Your blood pressure should be checked at least every 1 to 2 years. Ongoing high blood pressure should be treated with medicines if weight loss and exercise do not work.  If you are 55-79 years old, ask your health care provider if you should take aspirin to prevent strokes.  Diabetes screening is done by taking a blood sample to check your blood glucose level after you have not eaten for a certain period of time (fasting). If you are not overweight and you do not have risk factors for diabetes, you should be screened once every 3 years starting at age 45. If you are overweight or obese and you are 40-70 years of age, you should be screened for diabetes every year as part of your cardiovascular risk assessment.  Breast cancer screening is essential preventive care for women. You should practice "breast self-awareness." This means understanding the normal appearance and feel of your breasts and may include breast self-examination. Any changes detected, no matter how small, should be reported to a health care provider. Women in their 20s and 30s should have a clinical breast exam (CBE) by a health care provider as part of a regular health exam every 1 to 3 years. After age 40, women should have a CBE every year. Starting at age 40, women should consider having a mammogram (breast X-ray test) every year. Women who have a family history of breast cancer should talk to their health care provider about genetic screening. Women at a high risk of breast cancer should talk to their health care providers about having an MRI and a mammogram every year.  Breast cancer gene (BRCA)-related cancer risk assessment is recommended for women who have family members with BRCA-related cancers. BRCA-related cancers include breast, ovarian, tubal, and peritoneal cancers. Having family members with these cancers may be associated with an increased risk for harmful changes (mutations) in the breast cancer genes BRCA1 and  BRCA2. Results of the assessment will determine the need for genetic counseling and BRCA1 and BRCA2 testing.  Your health care provider may recommend that you be screened regularly for cancer of the pelvic organs (ovaries, uterus, and vagina). This screening involves a pelvic examination, including checking for microscopic changes to the surface of your cervix (Pap test). You may be encouraged to have this screening done every 3 years, beginning at age 21.  For women ages 30-65, health care providers may recommend pelvic exams and Pap testing every 3 years, or they may recommend the Pap and pelvic exam, combined with testing for human papilloma virus (HPV), every 5 years. Some types of HPV increase your risk of cervical cancer. Testing for HPV may also be done on women of any age with unclear Pap test results.  Other health care providers may not recommend any screening for nonpregnant women who are considered low risk for pelvic cancer and who do not have symptoms. Ask your health care provider if a screening pelvic exam is right for   you.  If you have had past treatment for cervical cancer or a condition that could lead to cancer, you need Pap tests and screening for cancer for at least 20 years after your treatment. If Pap tests have been discontinued, your risk factors (such as having a new sexual partner) need to be reassessed to determine if screening should resume. Some women have medical problems that increase the chance of getting cervical cancer. In these cases, your health care provider may recommend more frequent screening and Pap tests.  Colorectal cancer can be detected and often prevented. Most routine colorectal cancer screening begins at the age of 50 years and continues through age 75 years. However, your health care provider may recommend screening at an earlier age if you have risk factors for colon cancer. On a yearly basis, your health care provider may provide home test kits to check  for hidden blood in the stool. Use of a small camera at the end of a tube, to directly examine the colon (sigmoidoscopy or colonoscopy), can detect the earliest forms of colorectal cancer. Talk to your health care provider about this at age 50, when routine screening begins. Direct exam of the colon should be repeated every 5-10 years through age 75 years, unless early forms of precancerous polyps or small growths are found.  People who are at an increased risk for hepatitis B should be screened for this virus. You are considered at high risk for hepatitis B if:  You were born in a country where hepatitis B occurs often. Talk with your health care provider about which countries are considered high risk.  Your parents were born in a high-risk country and you have not received a shot to protect against hepatitis B (hepatitis B vaccine).  You have HIV or AIDS.  You use needles to inject street drugs.  You live with, or have sex with, someone who has hepatitis B.  You get hemodialysis treatment.  You take certain medicines for conditions like cancer, organ transplantation, and autoimmune conditions.  Hepatitis C blood testing is recommended for all people born from 1945 through 1965 and any individual with known risks for hepatitis C.  Practice safe sex. Use condoms and avoid high-risk sexual practices to reduce the spread of sexually transmitted infections (STIs). STIs include gonorrhea, chlamydia, syphilis, trichomonas, herpes, HPV, and human immunodeficiency virus (HIV). Herpes, HIV, and HPV are viral illnesses that have no cure. They can result in disability, cancer, and death.  You should be screened for sexually transmitted illnesses (STIs) including gonorrhea and chlamydia if:  You are sexually active and are younger than 24 years.  You are older than 24 years and your health care provider tells you that you are at risk for this type of infection.  Your sexual activity has changed  since you were last screened and you are at an increased risk for chlamydia or gonorrhea. Ask your health care provider if you are at risk.  If you are at risk of being infected with HIV, it is recommended that you take a prescription medicine daily to prevent HIV infection. This is called preexposure prophylaxis (PrEP). You are considered at risk if:  You are sexually active and do not regularly use condoms or know the HIV status of your partner(s).  You take drugs by injection.  You are sexually active with a partner who has HIV.  Talk with your health care provider about whether you are at high risk of being infected with HIV. If   you choose to begin PrEP, you should first be tested for HIV. You should then be tested every 3 months for as long as you are taking PrEP.  Osteoporosis is a disease in which the bones lose minerals and strength with aging. This can result in serious bone fractures or breaks. The risk of osteoporosis can be identified using a bone density scan. Women ages 67 years and over and women at risk for fractures or osteoporosis should discuss screening with their health care providers. Ask your health care provider whether you should take a calcium supplement or vitamin D to reduce the rate of osteoporosis.  Menopause can be associated with physical symptoms and risks. Hormone replacement therapy is available to decrease symptoms and risks. You should talk to your health care provider about whether hormone replacement therapy is right for you.  Use sunscreen. Apply sunscreen liberally and repeatedly throughout the day. You should seek shade when your shadow is shorter than you. Protect yourself by wearing long sleeves, pants, a wide-brimmed hat, and sunglasses year round, whenever you are outdoors.  Once a month, do a whole body skin exam, using a mirror to look at the skin on your back. Tell your health care provider of new moles, moles that have irregular borders, moles that  are larger than a pencil eraser, or moles that have changed in shape or color.  Stay current with required vaccines (immunizations).  Influenza vaccine. All adults should be immunized every year.  Tetanus, diphtheria, and acellular pertussis (Td, Tdap) vaccine. Pregnant women should receive 1 dose of Tdap vaccine during each pregnancy. The dose should be obtained regardless of the length of time since the last dose. Immunization is preferred during the 27th-36th week of gestation. An adult who has not previously received Tdap or who does not know her vaccine status should receive 1 dose of Tdap. This initial dose should be followed by tetanus and diphtheria toxoids (Td) booster doses every 10 years. Adults with an unknown or incomplete history of completing a 3-dose immunization series with Td-containing vaccines should begin or complete a primary immunization series including a Tdap dose. Adults should receive a Td booster every 10 years.  Varicella vaccine. An adult without evidence of immunity to varicella should receive 2 doses or a second dose if she has previously received 1 dose. Pregnant females who do not have evidence of immunity should receive the first dose after pregnancy. This first dose should be obtained before leaving the health care facility. The second dose should be obtained 4-8 weeks after the first dose.  Human papillomavirus (HPV) vaccine. Females aged 13-26 years who have not received the vaccine previously should obtain the 3-dose series. The vaccine is not recommended for use in pregnant females. However, pregnancy testing is not needed before receiving a dose. If a female is found to be pregnant after receiving a dose, no treatment is needed. In that case, the remaining doses should be delayed until after the pregnancy. Immunization is recommended for any person with an immunocompromised condition through the age of 61 years if she did not get any or all doses earlier. During the  3-dose series, the second dose should be obtained 4-8 weeks after the first dose. The third dose should be obtained 24 weeks after the first dose and 16 weeks after the second dose.  Zoster vaccine. One dose is recommended for adults aged 30 years or older unless certain conditions are present.  Measles, mumps, and rubella (MMR) vaccine. Adults born  before 1957 generally are considered immune to measles and mumps. Adults born in 1957 or later should have 1 or more doses of MMR vaccine unless there is a contraindication to the vaccine or there is laboratory evidence of immunity to each of the three diseases. A routine second dose of MMR vaccine should be obtained at least 28 days after the first dose for students attending postsecondary schools, health care workers, or international travelers. People who received inactivated measles vaccine or an unknown type of measles vaccine during 1963-1967 should receive 2 doses of MMR vaccine. People who received inactivated mumps vaccine or an unknown type of mumps vaccine before 1979 and are at high risk for mumps infection should consider immunization with 2 doses of MMR vaccine. For females of childbearing age, rubella immunity should be determined. If there is no evidence of immunity, females who are not pregnant should be vaccinated. If there is no evidence of immunity, females who are pregnant should delay immunization until after pregnancy. Unvaccinated health care workers born before 1957 who lack laboratory evidence of measles, mumps, or rubella immunity or laboratory confirmation of disease should consider measles and mumps immunization with 2 doses of MMR vaccine or rubella immunization with 1 dose of MMR vaccine.  Pneumococcal 13-valent conjugate (PCV13) vaccine. When indicated, a person who is uncertain of his immunization history and has no record of immunization should receive the PCV13 vaccine. All adults 65 years of age and older should receive this  vaccine. An adult aged 19 years or older who has certain medical conditions and has not been previously immunized should receive 1 dose of PCV13 vaccine. This PCV13 should be followed with a dose of pneumococcal polysaccharide (PPSV23) vaccine. Adults who are at high risk for pneumococcal disease should obtain the PPSV23 vaccine at least 8 weeks after the dose of PCV13 vaccine. Adults older than 65 years of age who have normal immune system function should obtain the PPSV23 vaccine dose at least 1 year after the dose of PCV13 vaccine.  Pneumococcal polysaccharide (PPSV23) vaccine. When PCV13 is also indicated, PCV13 should be obtained first. All adults aged 65 years and older should be immunized. An adult younger than age 65 years who has certain medical conditions should be immunized. Any person who resides in a nursing home or long-term care facility should be immunized. An adult smoker should be immunized. People with an immunocompromised condition and certain other conditions should receive both PCV13 and PPSV23 vaccines. People with human immunodeficiency virus (HIV) infection should be immunized as soon as possible after diagnosis. Immunization during chemotherapy or radiation therapy should be avoided. Routine use of PPSV23 vaccine is not recommended for American Indians, [place] Natives, or people younger than 65 years unless there are medical conditions that require PPSV23 vaccine. When indicated, people who have unknown immunization and have no record of immunization should receive PPSV23 vaccine. One-time revaccination 5 years after the first dose of PPSV23 is recommended for people aged 19-64 years who have chronic kidney failure, nephrotic syndrome, asplenia, or immunocompromised conditions. People who received 1-2 doses of PPSV23 before age 65 years should receive another dose of PPSV23 vaccine at age 65 years or later if at least 5 years have passed since the previous dose. Doses of PPSV23 are not  needed for people immunized with PPSV23 at or after age 65 years.  Meningococcal vaccine. Adults with asplenia or persistent complement component deficiencies should receive 2 doses of quadrivalent meningococcal conjugate (MenACWY-D) vaccine. The doses should be obtained   at least 2 months apart. Microbiologists working with certain meningococcal bacteria, [place] recruits, people at risk during an outbreak, and people who travel to or live in countries with a high rate of meningitis should be immunized. A first-year college student up through age 34 years who is living in a residence hall should receive a dose if she did not receive a dose on or after her 16th birthday. Adults who have certain high-risk conditions should receive one or more doses of vaccine.  Hepatitis A vaccine. Adults who wish to be protected from this disease, have certain high-risk conditions, work with hepatitis A-infected animals, work in hepatitis A research labs, or travel to or work in countries with a high rate of hepatitis A should be immunized. Adults who were previously unvaccinated and who anticipate close contact with an international adoptee during the first 60 days after arrival in the [place] from a country with a high rate of hepatitis A should be immunized.  Hepatitis B vaccine. Adults who wish to be protected from this disease, have certain high-risk conditions, may be exposed to blood or other infectious body fluids, are household contacts or sex partners of hepatitis B positive people, are clients or workers in certain care facilities, or travel to or work in countries with a high rate of hepatitis B should be immunized.  Haemophilus influenzae type b (Hib) vaccine. A previously unvaccinated person with asplenia or sickle cell disease or having a scheduled splenectomy should receive 1 dose of Hib vaccine. Regardless of previous immunization, a recipient of a hematopoietic stem cell transplant should receive a  3-dose series 6-12 months after her successful transplant. Hib vaccine is not recommended for adults with HIV infection. Preventive Services / Frequency Ages 35 to 4 years  Blood pressure check.** / Every 3-5 years.  Lipid and cholesterol check.** / Every 5 years beginning at age 60.  Clinical breast exam.** / Every 3 years for women in their 71s and 10s.  BRCA-related cancer risk assessment.** / For women who have family members with a BRCA-related cancer (breast, ovarian, tubal, or peritoneal cancers).  Pap test.** / Every 2 years from ages 76 through 26. Every 3 years starting at age 61 through age 76 or 93 with a history of 3 consecutive normal Pap tests.  HPV screening.** / Every 3 years from ages 37 through ages 60 to 51 with a history of 3 consecutive normal Pap tests.  Hepatitis C blood test.** / For any individual with known risks for hepatitis C.  Skin self-exam. / Monthly.  Influenza vaccine. / Every year.  Tetanus, diphtheria, and acellular pertussis (Tdap, Td) vaccine.** / Consult your health care provider. Pregnant women should receive 1 dose of Tdap vaccine during each pregnancy. 1 dose of Td every 10 years.  Varicella vaccine.** / Consult your health care provider. Pregnant females who do not have evidence of immunity should receive the first dose after pregnancy.  HPV vaccine. / 3 doses over 6 months, if 93 and younger. The vaccine is not recommended for use in pregnant females. However, pregnancy testing is not needed before receiving a dose.  Measles, mumps, rubella (MMR) vaccine.** / You need at least 1 dose of MMR if you were born in 1957 or later. You may also need a 2nd dose. For females of childbearing age, rubella immunity should be determined. If there is no evidence of immunity, females who are not pregnant should be vaccinated. If there is no evidence of immunity, females who are  pregnant should delay immunization until after pregnancy.  Pneumococcal  13-valent conjugate (PCV13) vaccine.** / Consult your health care provider.  Pneumococcal polysaccharide (PPSV23) vaccine.** / 1 to 2 doses if you smoke cigarettes or if you have certain conditions.  Meningococcal vaccine.** / 1 dose if you are age 68 to 8 years and a Market researcher living in a residence hall, or have one of several medical conditions, you need to get vaccinated against meningococcal disease. You may also need additional booster doses.  Hepatitis A vaccine.** / Consult your health care provider.  Hepatitis B vaccine.** / Consult your health care provider.  Haemophilus influenzae type b (Hib) vaccine.** / Consult your health care provider. Ages 7 to 53 years  Blood pressure check.** / Every year.  Lipid and cholesterol check.** / Every 5 years beginning at age 25 years.  Lung cancer screening. / Every year if you are aged 11-80 years and have a 30-pack-year history of smoking and currently smoke or have quit within the past 15 years. Yearly screening is stopped once you have quit smoking for at least 15 years or develop a health problem that would prevent you from having lung cancer treatment.  Clinical breast exam.** / Every year after age 48 years.  BRCA-related cancer risk assessment.** / For women who have family members with a BRCA-related cancer (breast, ovarian, tubal, or peritoneal cancers).  Mammogram.** / Every year beginning at age 41 years and continuing for as long as you are in good health. Consult with your health care provider.  Pap test.** / Every 3 years starting at age 65 years through age 37 or 70 years with a history of 3 consecutive normal Pap tests.  HPV screening.** / Every 3 years from ages 72 years through ages 60 to 40 years with a history of 3 consecutive normal Pap tests.  Fecal occult blood test (FOBT) of stool. / Every year beginning at age 21 years and continuing until age 5 years. You may not need to do this test if you get  a colonoscopy every 10 years.  Flexible sigmoidoscopy or colonoscopy.** / Every 5 years for a flexible sigmoidoscopy or every 10 years for a colonoscopy beginning at age 35 years and continuing until age 48 years.  Hepatitis C blood test.** / For all people born from [birth year] through 1965 and any individual with known risks for hepatitis C.  Skin self-exam. / Monthly.  Influenza vaccine. / Every year.  Tetanus, diphtheria, and acellular pertussis (Tdap/Td) vaccine.** / Consult your health care provider. Pregnant women should receive 1 dose of Tdap vaccine during each pregnancy. 1 dose of Td every 10 years.  Varicella vaccine.** / Consult your health care provider. Pregnant females who do not have evidence of immunity should receive the first dose after pregnancy.  Zoster vaccine.** / 1 dose for adults aged 30 years or older.  Measles, mumps, rubella (MMR) vaccine.** / You need at least 1 dose of MMR if you were born in 1957 or later. You may also need a second dose. For females of childbearing age, rubella immunity should be determined. If there is no evidence of immunity, females who are not pregnant should be vaccinated. If there is no evidence of immunity, females who are pregnant should delay immunization until after pregnancy.  Pneumococcal 13-valent conjugate (PCV13) vaccine.** / Consult your health care provider.  Pneumococcal polysaccharide (PPSV23) vaccine.** / 1 to 2 doses if you smoke cigarettes or if you have certain conditions.  Meningococcal vaccine.** /  Consult your health care provider.  Hepatitis A vaccine.** / Consult your health care provider.  Hepatitis B vaccine.** / Consult your health care provider.  Haemophilus influenzae type b (Hib) vaccine.** / Consult your health care provider. Ages 64 years and over  Blood pressure check.** / Every year.  Lipid and cholesterol check.** / Every 5 years beginning at age 23 years.  Lung cancer screening. / Every year if you  are aged 16-80 years and have a 30-pack-year history of smoking and currently smoke or have quit within the past 15 years. Yearly screening is stopped once you have quit smoking for at least 15 years or develop a health problem that would prevent you from having lung cancer treatment.  Clinical breast exam.** / Every year after age 74 years.  BRCA-related cancer risk assessment.** / For women who have family members with a BRCA-related cancer (breast, ovarian, tubal, or peritoneal cancers).  Mammogram.** / Every year beginning at age 44 years and continuing for as long as you are in good health. Consult with your health care provider.  Pap test.** / Every 3 years starting at age 58 years through age 22 or 39 years with 3 consecutive normal Pap tests. Testing can be stopped between 65 and 70 years with 3 consecutive normal Pap tests and no abnormal Pap or HPV tests in the past 10 years.  HPV screening.** / Every 3 years from ages 64 years through ages 70 or 61 years with a history of 3 consecutive normal Pap tests. Testing can be stopped between 65 and 70 years with 3 consecutive normal Pap tests and no abnormal Pap or HPV tests in the past 10 years.  Fecal occult blood test (FOBT) of stool. / Every year beginning at age 40 years and continuing until age 27 years. You may not need to do this test if you get a colonoscopy every 10 years.  Flexible sigmoidoscopy or colonoscopy.** / Every 5 years for a flexible sigmoidoscopy or every 10 years for a colonoscopy beginning at age 7 years and continuing until age 32 years.  Hepatitis C blood test.** / For all people born from [birth year] through 1966 and any individual with known risks for hepatitis C.  Osteoporosis screening.** / A one-time screening for women ages 30 years and over and women at risk for fractures or osteoporosis.  Skin self-exam. / Monthly.  Influenza vaccine. / Every year.  Tetanus, diphtheria, and acellular pertussis (Tdap/Td)  vaccine.** / 1 dose of Td every 10 years.  Varicella vaccine.** / Consult your health care provider.  Zoster vaccine.** / 1 dose for adults aged 35 years or older.  Pneumococcal 13-valent conjugate (PCV13) vaccine.** / Consult your health care provider.  Pneumococcal polysaccharide (PPSV23) vaccine.** / 1 dose for all adults aged 46 years and older.  Meningococcal vaccine.** / Consult your health care provider.  Hepatitis A vaccine.** / Consult your health care provider.  Hepatitis B vaccine.** / Consult your health care provider.  Haemophilus influenzae type b (Hib) vaccine.** / Consult your health care provider. ** Family history and personal history of risk and conditions may change your health care provider's recommendations.   This information is not intended to replace advice given to you by your health care provider. Make sure you discuss any questions you have with your health care provider.   Document Released: 08/07/2001 Document Revised: 07/02/2014 Document Reviewed: 11/06/2010 Elsevier Interactive Patient Education [employer].

## 2015-04-26 NOTE — Progress Notes (Signed)
Subjective:  Patient ID: Margaret Norman, female    DOB: [birth year]  Age: 64 y.o. MRN: [id number]  CC: Hypertension; Hyperlipidemia; Diabetes; and Annual Exam   HPI Kailynne C Connell presents for an annual physical as well as follow-up on multiple medical problems. She has new insurance and needs to change Prevacid to omeprazole for better insurance coverage. She is also struggling with frequent bladder infection and tells me that she has seen a urologist and is taking cranberry pills. She has not noticed much improvement. She also remains concerned about a hernia in her upper abdomen that occasionally causes discomfort. She previously had surgery on this and says that mesh was placed over the area.  Outpatient Prescriptions Prior to Visit  Medication Sig Dispense Refill  . aspirin 81 MG EC tablet Take 81 mg by mouth daily.      . B-D ULTRAFINE III SHORT PEN 31G X 8 MM MISC USE DAILY WITH LANTUS INSULIN 1 each 2  . Blood Glucose Monitoring Suppl (ONETOUCH VERIO FLEX SYSTEM) W/DEVICE KIT 1 kit by Does not apply route 3 (three) times daily. 1 kit 1  . buPROPion (WELLBUTRIN XL) 150 MG 24 hr tablet TAKE 1 TABLET EVERY MORNING 90 tablet 2  . canagliflozin (INVOKANA) 300 MG TABS tablet Take 300 mg by mouth daily. 90 tablet 1  . Cyanocobalamin (VITAMIN B12 PO) Take 2,500 mcg by mouth daily.    Marland Kitchen docusate sodium (COLACE) 100 MG capsule Take 100 mg by mouth daily.    . Doxepin HCl (SILENOR) 6 MG TABS Take 6 mg by mouth at bedtime as needed. For sleep    . glucose blood (ONETOUCH VERIO) test strip Test three times daily as directed 300 each 1  . HYDROcodone-acetaminophen (NORCO) 10-325 MG tablet Take 1 tablet by mouth every 8 (eight) hours as needed. 90 tablet 0  . insulin glargine (LANTUS) 100 UNIT/ML injection Inject 30 Units into the skin at bedtime.    Marland Kitchen LANTUS SOLOSTAR 100 UNIT/ML Solostar Pen INJECT 30 UNITS INTO THE SKIN AT BEDTIME 9 mL 3  . metFORMIN (GLUCOPHAGE) 1000 MG tablet Take 1,000 mg  by mouth 2 (two) times daily with a meal.    . Multiple Vitamin (MULTIVITAMIN) capsule Take 1 capsule by mouth daily.    . ONE TOUCH LANCETS MISC Test three times daily as directed 300 each 3  . pioglitazone (ACTOS) 30 MG tablet TAKE 1 TABLET DAILY 90 tablet 2  . Probiotic Product (PHILLIPS COLON HEALTH PO) Take 1,000 mg by mouth every evening.    . Pyridoxine HCl (VITAMIN B-6 PO) Take 1 tablet by mouth daily.     . sertraline (ZOLOFT) 50 MG tablet TAKE 1 TABLET ONCE DAILY 90 tablet 2  . TRULICITY 1.5 VZ/5.6LO SOPN INJECT 1 PEN INTO THE SKIN ONCE A WEEK 12 pen 2  . valsartan (DIOVAN) 320 MG tablet Take 1 tablet (320 mg total) by mouth daily. 90 tablet 3  . lansoprazole (PREVACID) 30 MG capsule Take 1 capsule (30 mg total) by mouth daily at 12 noon. 90 capsule 3   No facility-administered medications prior to visit.    ROS Review of Systems  Constitutional: Positive for fatigue. Negative for fever, chills, diaphoresis, appetite change and unexpected weight change.  HENT: Negative.  Negative for congestion, sore throat, trouble swallowing and voice change.   Eyes: Negative.   Respiratory: Negative.  Negative for cough, choking, chest tightness, shortness of breath and stridor.   Cardiovascular: Negative.  Negative for chest  pain, palpitations and leg swelling.  Gastrointestinal: Positive for abdominal pain. Negative for nausea, vomiting, diarrhea, constipation, blood in stool and abdominal distention.  Endocrine: Negative.  Negative for polydipsia, polyphagia and polyuria.  Genitourinary: Positive for dysuria. Negative for urgency, frequency, hematuria and difficulty urinating.  Musculoskeletal: Positive for arthralgias. Negative for myalgias, back pain, joint swelling and neck stiffness.  Skin: Negative.  Negative for rash.  Allergic/Immunologic: Negative.   Neurological: Negative.  Negative for dizziness, syncope, speech difficulty, light-headedness and headaches.  Hematological:  Negative.  Negative for adenopathy. Does not bruise/bleed easily.  Psychiatric/Behavioral: Positive for sleep disturbance and dysphoric mood. Negative for suicidal ideas, hallucinations, behavioral problems, confusion, self-injury, decreased concentration and agitation. The patient is nervous/anxious. The patient is not hyperactive.     Objective:  BP 126/88 mmHg  Pulse 88  Temp(Src) 97.9 F (36.6 C) (Oral)  Resp 16  Ht _0  (1.6 m)  Wt 163 lb (73.936 kg)  BMI 28.88 kg/m2  SpO2 98%  BP Readings from Last 3 Encounters:  04/26/15 126/88  12/23/14 130/70  09/13/14 108/64    Wt Readings from Last 3 Encounters:  04/26/15 163 lb (73.936 kg)  12/23/14 163 lb 12 oz (74.277 kg)  09/13/14 161 lb (73.029 kg)    Physical Exam  Constitutional: She is oriented to person, place, and time. No distress.  HENT:  Head: Normocephalic and atraumatic.  Mouth/Throat: Oropharynx is clear and moist. No oropharyngeal exudate.  Eyes: Conjunctivae are normal. Right eye exhibits no discharge. Left eye exhibits no discharge. No scleral icterus.  Neck: Normal range of motion. Neck supple. No JVD present. No tracheal deviation present. No thyromegaly present.  Cardiovascular: Normal rate, regular rhythm, normal heart sounds and intact distal pulses.  Exam reveals no gallop and no friction rub.   No murmur heard. Pulmonary/Chest: Effort normal and breath sounds normal. No respiratory distress. She has no wheezes. She has no rales. She exhibits no tenderness.  Abdominal: Soft. Normal appearance and bowel sounds are normal. She exhibits no shifting dullness, no distension, no pulsatile liver, no fluid wave, no abdominal bruit, no ascites, no pulsatile midline mass and no mass. There is no hepatosplenomegaly, splenomegaly or hepatomegaly. There is no tenderness. There is no rebound and no CVA tenderness. A hernia is present. Hernia confirmed positive in the ventral area. Hernia confirmed negative in the right  inguinal area and confirmed negative in the left inguinal area.  Musculoskeletal: Normal range of motion. She exhibits no edema or tenderness.  Lymphadenopathy:    She has no cervical adenopathy.  Neurological: She is oriented to person, place, and time.  Skin: Skin is warm and dry. No rash noted. She is not diaphoretic. No erythema. No pallor.  Vitals reviewed.   Lab Results  Component Value Date   WBC 13.1* 04/26/2015   HGB 13.1 04/26/2015   HCT 40.4 04/26/2015   PLT 426.0* 04/26/2015   GLUCOSE 102* 04/26/2015   CHOL 178 04/26/2015   TRIG 116.0 04/26/2015   HDL 55.70 04/26/2015   LDLDIRECT 137.9 02/04/2013   LDLCALC 100* 04/26/2015   ALT 12 04/26/2015   AST 14 04/26/2015   NA 141 04/26/2015   K 4.4 04/26/2015   CL 103 04/26/2015   CREATININE 0.81 04/26/2015   BUN 18 04/26/2015   CO2 29 04/26/2015   TSH 2.47 04/26/2015   HGBA1C 6.5 04/26/2015   MICROALBUR 1.3 09/13/2014    Ct Abdomen Pelvis W Contrast  01/21/2014  CLINICAL DATA:  Abdominal pain EXAM: CT ABDOMEN  AND PELVIS WITH CONTRAST TECHNIQUE: Multidetector CT imaging of the abdomen and pelvis was performed using the standard protocol following bolus administration of intravenous contrast. Oral contrast was also administered CONTRAST:  127m OMNIPAQUE IOHEXOL 300 MG/ML  SOLN COMPARISON:  January 09, 2010 FINDINGS: Lung bases are clear. Liver is prominent, measuring 17.2 cm in length. No focal liver lesions are appreciable. Gallbladder is absent. There is no appreciable biliary duct dilatation. 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 A portion of the head remains as does the uncinate process. There is no pancreatic abnormality in the regions of the pancreas which remain. This appearance is stable compared to the prior study. Adrenals appear normal. There is a simple cyst in the medial mid right kidney measuring 1.4 by 1.2 cm. There is a simple cyst in the lateral  right mid kidney measuring 1 x 1 cm. There is a mass arising from the lateral mid left kidney measuring 1 x 1 cm which has attenuation values consistent with a cyst. There is a mass arising from the upper pole of the left kidney anteriorly measuring 8 x 7 mm which has attenuation values higher than is expected with a simple cyst. There is a mass arising from the lower pole left kidney measuring 1.4 x 1.1 cm which has attenuation values higher than simple cyst which has increased slightly in size compared to the prior study. There is no hydronephrosis on either side. There is no renal or ureteral calculus on either side. Since the prior study, the patient has undergone ventral hernia repair. There is currently no appreciable ventral hernia. In the pelvis, the urinary bladder is midline with normal wall thickness. There are sigmoid diverticula without diverticulitis. There is no pelvic mass or fluid collection. Appendix appears normal. There is no bowel obstruction. There is no free air or portal venous air. There is moderate stool throughout colon diffusely. There is no ascites, adenopathy, or abscess in the abdomen or pelvis. There is no evidence of abdominal aortic aneurysm. There is degenerative change in the lumbar spine. There are no blastic or lytic bone lesions. IMPRESSION: There is a small mass arising from the lower pole left kidney which has attenuation values slightly higher than simple cyst. This mass has become slightly larger compared to the previous study. Given this circumstance, further evaluation is warranted. Further evaluation with pre and post contrast MRI or CT should be considered. MRI is preferred in younger patients (due to lack of ionizing radiation) and for evaluating calcified lesion(s). Spleen is absent.  Patient has had a partial pancreatectomy, stable. Liver is prominent but otherwise appears unremarkable. Sigmoid diverticula without diverticulitis. No bowel obstruction. No abscess.  Appendix appears normal. The patient has undergone ventral hernia repair; previously noted ventral hernia is no longer appreciable. There is some scarring in the anterior abdominal wall consistent with previous surgery for this hernia. Electronically Signed   By: WLowella GripM.D.   On: 01/21/2014 17:44    Assessment & Plan:   DLacewas seen today for hypertension, hyperlipidemia, diabetes and annual exam.  Diagnoses and all orders for this visit:  Gastroesophageal reflux disease without esophagitis- PPI changed at her request -     omeprazole (PRILOSEC) 40 MG capsule; Take 1 capsule (40 mg total) by mouth daily.  Need for hepatitis C screening test -     Hepatitis C Antibody; Future  Need for influenza vaccination -  Flu Vaccine QUAD 36+ mos IM  Leukocytosis- this is stable, there is no evidence of lymphoproliferative dz -     CBC with Differential/Platelet; Future  Type 2 diabetes mellitus with complication, without long-term current use of insulin ([place])- her blood sugars are well controlled -     Comprehensive metabolic panel; Future -     Hemoglobin A1c; Future  Essential hypertension- her BP is well controlled, lytes and renal function are stable -     Comprehensive metabolic panel; Future -     Urinalysis, Routine w reflex microscopic (not at [facility]); Future  Hyperlipidemia with target LDL less than 100- she has achieved her LDL goal -     Lipid panel; Future -     Comprehensive metabolic panel; Future -     TSH; Future  Visit for screening mammogram  Abdominal wall hernia -     Ambulatory referral to General Surgery   I have discontinued Ms. Trosper's lansoprazole. I am also having her start on omeprazole. Additionally, I am having her maintain her aspirin, Doxepin HCl, multivitamin, Cyanocobalamin (VITAMIN B12 PO), Probiotic Product ([place]), Pyridoxine HCl (VITAMIN B-6 PO), docusate sodium, insulin glargine, metFORMIN, sertraline,  pioglitazone, B-D ULTRAFINE III SHORT PEN, buPROPion, LANTUS SOLOSTAR, TRULICITY, canagliflozin, ONETOUCH VERIO FLEX SYSTEM, glucose blood, ONE TOUCH LANCETS, valsartan, HYDROcodone-acetaminophen, and Cranberry.  Meds ordered this encounter  Medications  . Cranberry (ELLURA) 200 MG CAPS    Sig: Take by mouth daily.  Marland Kitchen omeprazole (PRILOSEC) 40 MG capsule    Sig: Take 1 capsule (40 mg total) by mouth daily.    Dispense:  90 capsule    Refill:  3     Follow-up: Return in about 4 months (around 08/24/2015).  Scarlette Calico, MD

## 2015-04-27 ENCOUNTER — Encounter: Payer: Self-pay | Admitting: Internal Medicine

## 2015-04-27 DIAGNOSIS — Z0000 Encounter for general adult medical examination without abnormal findings: Secondary | ICD-10-CM | POA: Insufficient documentation

## 2015-04-27 NOTE — Assessment & Plan Note (Signed)

## 2015-05-10 ENCOUNTER — Other Ambulatory Visit: Payer: Self-pay | Admitting: Internal Medicine

## 2015-05-10 DIAGNOSIS — M5416 Radiculopathy, lumbar region: Secondary | ICD-10-CM

## 2015-05-10 MED ORDER — HYDROCODONE-ACETAMINOPHEN 10-325 MG PO TABS: 1.0000 | ORAL_TABLET | Freq: Three times a day (TID) | ORAL | Status: DC | PRN

## 2015-05-13 ENCOUNTER — Encounter: Payer: Self-pay | Admitting: Internal Medicine

## 2015-05-30 ENCOUNTER — Other Ambulatory Visit: Payer: Self-pay

## 2015-05-30 DIAGNOSIS — E118 Type 2 diabetes mellitus with unspecified complications: Secondary | ICD-10-CM

## 2015-05-30 DIAGNOSIS — I10 Essential (primary) hypertension: Secondary | ICD-10-CM

## 2015-05-30 MED ORDER — CANAGLIFLOZIN 300 MG PO TABS: 300.0000 mg | ORAL_TABLET | Freq: Every day | ORAL | Status: DC

## 2015-05-30 MED ORDER — VALSARTAN 320 MG PO TABS: 320.0000 mg | ORAL_TABLET | Freq: Every day | ORAL | Status: DC

## 2015-05-30 MED ORDER — PIOGLITAZONE HCL 30 MG PO TABS: 30.0000 mg | ORAL_TABLET | Freq: Every day | ORAL | Status: DC

## 2015-05-30 MED ORDER — METFORMIN HCL 1000 MG PO TABS: 1000.0000 mg | ORAL_TABLET | Freq: Two times a day (BID) | ORAL | Status: DC

## 2015-06-15 ENCOUNTER — Other Ambulatory Visit: Payer: Self-pay

## 2015-06-15 MED ORDER — BUPROPION HCL ER (XL) 150 MG PO TB24: 150.0000 mg | ORAL_TABLET | Freq: Every morning | ORAL | Status: DC

## 2015-06-15 MED ORDER — DULAGLUTIDE 1.5 MG/0.5ML ~~LOC~~ SOAJ: SUBCUTANEOUS | Status: DC

## 2015-06-15 MED ORDER — SERTRALINE HCL 50 MG PO TABS: 50.0000 mg | ORAL_TABLET | Freq: Every day | ORAL | Status: DC

## 2015-06-15 NOTE — Telephone Encounter (Signed)
Incoming fax for rf. Ok to fill?

## 2015-06-21 ENCOUNTER — Telehealth: Payer: Self-pay | Admitting: Family

## 2015-06-21 DIAGNOSIS — J029 Acute pharyngitis, unspecified: Secondary | ICD-10-CM

## 2015-06-21 DIAGNOSIS — R05 Cough: Secondary | ICD-10-CM

## 2015-06-21 DIAGNOSIS — B9789 Other viral agents as the cause of diseases classified elsewhere: Secondary | ICD-10-CM

## 2015-06-21 DIAGNOSIS — R059 Cough, unspecified: Secondary | ICD-10-CM

## 2015-06-21 DIAGNOSIS — J028 Acute pharyngitis due to other specified organisms: Secondary | ICD-10-CM

## 2015-06-21 MED ORDER — BENZONATATE 100 MG PO CAPS: 100.0000 mg | ORAL_CAPSULE | Freq: Two times a day (BID) | ORAL | Status: DC | PRN

## 2015-06-21 NOTE — Progress Notes (Signed)
We are sorry that you are not feeling well.  Here is how we plan to help!  Based on what you have shared with me it looks like you have upper respiratory tract inflammation that has resulted in a significant cough.  Inflammation and infection in the upper respiratory tract is commonly called bronchitis and has four common causes:  Allergies, Viral Infections, Acid Reflux and Bacterial Infections.  Allergies, viruses and acid reflux are treated by controlling symptoms or eliminating the cause. An example might be a cough caused by taking certain blood pressure medications. You stop the cough by changing the medication. Another example might be a cough caused by acid reflux. Controlling the reflux helps control the cough.  Based on your presentation I believe you most likely have A cough due to allergies.  I recommend that you start the an over-the counter-allergy medication such as Claritin 10 mg or Zyrtec 10 mg daily.    In addition you may [id number] Perles 100 mg twice a day as needed for cough. I have sent that to the pharmacy     HOME CARE . Only take medications as instructed by your medical team. . Complete the entire course of an antibiotic. . Drink plenty of fluids and get plenty of rest. . Avoid close contacts especially the very young and the elderly . Cover your mouth if you cough or cough into your sleeve. . Always remember to wash your hands . A steam or ultrasonic humidifier can help congestion.    GET HELP RIGHT AWAY IF: . You develop worsening fever. . You become short of breath . You cough up blood. . Your symptoms persist after you have completed your treatment plan MAKE SURE YOU   Understand these instructions.  Will watch your condition.  Will get help right away if you are not doing well or get worse.  Your e-visit answers were reviewed by a board certified advanced clinical practitioner to complete your personal care plan.  Depending on the condition, your plan  could have included both over the counter or prescription medications. If there is a problem please reply  once you have received a response from your provider. Your safety is important to [place].  If you have drug allergies check your prescription carefully.    You can use MyChart to ask questions about today's visit, request a non-urgent call back, or ask for a work or school excuse for 24 hours related to this e-Visit. If it has been greater than 24 hours you will need to follow up with your provider, or enter a new e-Visit to address those concerns. You will get an e-mail in the next two days asking about your experience.  I hope that your e-visit has been valuable and will speed your recovery. Thank you for using e-visits.

## 2015-07-04 ENCOUNTER — Other Ambulatory Visit: Payer: Self-pay | Admitting: Internal Medicine

## 2015-07-04 DIAGNOSIS — M5416 Radiculopathy, lumbar region: Secondary | ICD-10-CM

## 2015-07-04 MED ORDER — HYDROCODONE-ACETAMINOPHEN 10-325 MG PO TABS: 1.0000 | ORAL_TABLET | Freq: Three times a day (TID) | ORAL | Status: DC | PRN

## 2015-07-12 DIAGNOSIS — Z0000 Encounter for general adult medical examination without abnormal findings: Secondary | ICD-10-CM | POA: Diagnosis not present

## 2015-07-12 DIAGNOSIS — R8271 Bacteriuria: Secondary | ICD-10-CM | POA: Diagnosis not present

## 2015-07-18 ENCOUNTER — Other Ambulatory Visit: Payer: Self-pay | Admitting: Internal Medicine

## 2015-07-29 DIAGNOSIS — N390 Urinary tract infection, site not specified: Secondary | ICD-10-CM | POA: Diagnosis not present

## 2015-08-01 ENCOUNTER — Other Ambulatory Visit: Payer: Self-pay | Admitting: Internal Medicine

## 2015-08-01 ENCOUNTER — Encounter: Payer: Self-pay | Admitting: Internal Medicine

## 2015-08-25 ENCOUNTER — Encounter: Payer: Self-pay | Admitting: Internal Medicine

## 2015-08-25 ENCOUNTER — Other Ambulatory Visit: Payer: Self-pay | Admitting: Internal Medicine

## 2015-08-25 ENCOUNTER — Ambulatory Visit (INDEPENDENT_AMBULATORY_CARE_PROVIDER_SITE_OTHER): Payer: Medicare Other | Admitting: Internal Medicine

## 2015-08-25 ENCOUNTER — Other Ambulatory Visit (INDEPENDENT_AMBULATORY_CARE_PROVIDER_SITE_OTHER): Payer: Medicare Other

## 2015-08-25 VITALS — BP 138/100 | HR 98 | Temp 98.2°F | Resp 16 | Wt 168.0 lb

## 2015-08-25 DIAGNOSIS — Z9081 Acquired absence of spleen: Secondary | ICD-10-CM

## 2015-08-25 DIAGNOSIS — D75838 Other thrombocytosis: Secondary | ICD-10-CM

## 2015-08-25 DIAGNOSIS — I447 Left bundle-branch block, unspecified: Secondary | ICD-10-CM

## 2015-08-25 DIAGNOSIS — E46 Unspecified protein-calorie malnutrition: Secondary | ICD-10-CM

## 2015-08-25 DIAGNOSIS — D72829 Elevated white blood cell count, unspecified: Secondary | ICD-10-CM | POA: Diagnosis not present

## 2015-08-25 DIAGNOSIS — I10 Essential (primary) hypertension: Secondary | ICD-10-CM

## 2015-08-25 DIAGNOSIS — R769 Abnormal immunological finding in serum, unspecified: Secondary | ICD-10-CM | POA: Diagnosis not present

## 2015-08-25 DIAGNOSIS — R601 Generalized edema: Secondary | ICD-10-CM | POA: Diagnosis not present

## 2015-08-25 DIAGNOSIS — R7989 Other specified abnormal findings of blood chemistry: Principal | ICD-10-CM

## 2015-08-25 DIAGNOSIS — R778 Other specified abnormalities of plasma proteins: Secondary | ICD-10-CM

## 2015-08-25 DIAGNOSIS — M5416 Radiculopathy, lumbar region: Secondary | ICD-10-CM

## 2015-08-25 DIAGNOSIS — R609 Edema, unspecified: Secondary | ICD-10-CM

## 2015-08-25 DIAGNOSIS — M5417 Radiculopathy, lumbosacral region: Secondary | ICD-10-CM

## 2015-08-25 DIAGNOSIS — E118 Type 2 diabetes mellitus with unspecified complications: Secondary | ICD-10-CM | POA: Diagnosis not present

## 2015-08-25 DIAGNOSIS — R799 Abnormal finding of blood chemistry, unspecified: Secondary | ICD-10-CM

## 2015-08-25 DIAGNOSIS — M542 Cervicalgia: Secondary | ICD-10-CM

## 2015-08-25 DIAGNOSIS — Z9089 Acquired absence of other organs: Secondary | ICD-10-CM

## 2015-08-25 LAB — BASIC METABOLIC PANEL
BUN: 16 mg/dL (ref 6–23)
CHLORIDE: 109 meq/L (ref 96–112)
CO2: 25 meq/L (ref 19–32)
CREATININE: 0.65 mg/dL (ref 0.40–1.20)
Calcium: 9.2 mg/dL (ref 8.4–10.5)
GFR: 97.15 mL/min (ref >60.00)
GLUCOSE: 105 mg/dL — AB (ref 70–99)
Potassium: 3.9 mEq/L (ref 3.5–5.1)
Sodium: 144 mEq/L (ref 135–145)

## 2015-08-25 LAB — CBC WITH DIFFERENTIAL/PLATELET
BASOS ABS: 0.2 10*3/uL — AB (ref 0.0–0.1)
Basophils Relative: 1.2 % (ref 0.0–3.0)
Eosinophils Absolute: 0.5 10*3/uL (ref 0.0–0.7)
Eosinophils Relative: 3.3 % (ref 0.0–5.0)
HEMATOCRIT: 36.4 % (ref 36.0–46.0)
HEMOGLOBIN: 11.9 g/dL — AB (ref 12.0–15.0)
LYMPHS ABS: 6.1 10*3/uL — AB (ref 0.7–4.0)
LYMPHS PCT: 40.1 % (ref 12.0–46.0)
MCHC: 32.5 g/dL (ref 30.0–36.0)
MCV: 87.6 fl (ref 78.0–100.0)
MONOS PCT: 10.7 % (ref 3.0–12.0)
Monocytes Absolute: 1.6 10*3/uL — ABNORMAL HIGH (ref 0.1–1.0)
NEUTROS PCT: 44.7 % (ref 43.0–77.0)
Neutro Abs: 6.7 10*3/uL (ref 1.4–7.7)
Platelets: 437 10*3/uL — ABNORMAL HIGH (ref 150.0–400.0)
RBC: 4.16 Mil/uL (ref 3.87–5.11)
RDW: 14.7 % (ref 11.5–15.5)
WBC: 15.1 10*3/uL — AB (ref 4.0–10.5)

## 2015-08-25 LAB — HEMOGLOBIN A1C: HEMOGLOBIN A1C: 6.7 % — AB (ref 4.6–6.5)

## 2015-08-25 LAB — BRAIN NATRIURETIC PEPTIDE: Pro B Natriuretic peptide (BNP): 241 pg/mL — ABNORMAL HIGH (ref 0.0–100.0)

## 2015-08-25 MED ORDER — HYDROCODONE-ACETAMINOPHEN 10-325 MG PO TABS: 1.0000 | ORAL_TABLET | Freq: Three times a day (TID) | ORAL | Status: DC | PRN

## 2015-08-25 MED ORDER — HYDROCHLOROTHIAZIDE 12.5 MG PO CAPS: 12.5000 mg | ORAL_CAPSULE | Freq: Every day | ORAL | Status: DC

## 2015-08-25 NOTE — Progress Notes (Signed)
Pre visit review using our clinic review tool, if applicable. No additional management support is needed unless otherwise documented below in the visit note. 

## 2015-08-25 NOTE — Progress Notes (Signed)
Subjective:  Patient ID: Margaret Norman, female    DOB: December 28, 1950  Age: 65 y.o. MRN: [id number]  CC: Hypertension and Diabetes   HPI Margaret Norman presents for follow-up on the above medical problems.  She also complains of a one-month history of left-sided neck pain that she describes as an achy sensation that does not radiate towards her arms or legs. She denies numbness, weakness, tingling in her arms or legs. She has had no trauma or injury.  She also complains of a several month history of worsening shortness of breath and edema in both ankles. She complains of stress and anxiety with multiple family members that are not healthy and taking a toll on her. She has mild dyspnea on exertion but she denies chest pain, diaphoresis, dizziness, nausea, or syncope.  She tells me her blood sugars up and well controlled. She complains that her blood pressure has not been well controlled.  Outpatient Prescriptions Prior to Visit  Medication Sig Dispense Refill  . aspirin 81 MG EC tablet Take 81 mg by mouth daily.      . B-D ULTRAFINE III SHORT PEN 31G X 8 MM MISC USE DAILY WITH LANTUS INSULIN 1 each 2  . Blood Glucose Monitoring Suppl (ONETOUCH VERIO FLEX SYSTEM) W/DEVICE KIT 1 kit by Does not apply route 3 (three) times daily. 1 kit 1  . buPROPion (WELLBUTRIN XL) 150 MG 24 hr tablet Take 1 tablet (150 mg total) by mouth every morning. 90 tablet 3  . Cranberry (ELLURA) 200 MG CAPS Take by mouth daily.    . Cyanocobalamin (VITAMIN B12 PO) Take 2,500 mcg by mouth daily.    Marland Kitchen docusate sodium (COLACE) 100 MG capsule Take 100 mg by mouth daily.    . Doxepin HCl (SILENOR) 6 MG TABS Take 6 mg by mouth at bedtime as needed. For sleep    . Dulaglutide (TRULICITY) 1.5 FG/1.8EX SOPN INJECT 1 PEN INTO THE SKIN ONCE A WEEK 12 pen 2  . glucose blood (ONETOUCH VERIO) test strip Test three times daily as directed 300 each 1  . LANTUS SOLOSTAR 100 UNIT/ML Solostar Pen INJECT 30 UNITS INTO THE SKIN AT  BEDTIME 9 mL 3  . metFORMIN (GLUCOPHAGE) 1000 MG tablet Take 1 tablet (1,000 mg total) by mouth 2 (two) times daily with a meal. 60 tablet 3  . Multiple Vitamin (MULTIVITAMIN) capsule Take 1 capsule by mouth daily.    Marland Kitchen omeprazole (PRILOSEC) 40 MG capsule Take 1 capsule (40 mg total) by mouth daily. 90 capsule 3  . ONE TOUCH LANCETS MISC Test three times daily as directed 300 each 3  . pioglitazone (ACTOS) 30 MG tablet Take 1 tablet (30 mg total) by mouth daily. 90 tablet 2  . Probiotic Product (PHILLIPS COLON HEALTH PO) Take 1,000 mg by mouth every evening.    . Pyridoxine HCl (VITAMIN B-6 PO) Take 1 tablet by mouth daily.     . sertraline (ZOLOFT) 50 MG tablet Take 1 tablet (50 mg total) by mouth daily. 90 tablet 3  . valsartan (DIOVAN) 320 MG tablet Take 1 tablet (320 mg total) by mouth daily. 90 tablet 3  . HYDROcodone-acetaminophen (NORCO) 10-325 MG tablet Take 1 tablet by mouth every 8 (eight) hours as needed. 90 tablet 0  . insulin glargine (LANTUS) 100 UNIT/ML injection Inject 30 Units into the skin at bedtime. Reported on 08/25/2015     No facility-administered medications prior to visit.    ROS Review of Systems  Constitutional:  Positive for fatigue. Negative for fever, chills, diaphoresis, activity change, appetite change and unexpected weight change.  HENT: Negative.   Eyes: Negative.   Respiratory: Negative.  Negative for cough, choking, chest tightness, shortness of breath and stridor.   Cardiovascular: Negative.  Negative for chest pain, palpitations and leg swelling.  Gastrointestinal: Negative.  Negative for nausea, vomiting, abdominal pain, diarrhea, constipation and blood in stool.  Endocrine: Negative.   Genitourinary: Negative.   Musculoskeletal: Positive for back pain and neck pain. Negative for myalgias, joint swelling, arthralgias, gait problem and neck stiffness.  Skin: Negative.  Negative for color change and rash.  Allergic/Immunologic: Negative.   Neurological:  Negative.  Negative for dizziness, tremors, syncope, light-headedness, numbness and headaches.  Hematological: Negative.  Negative for adenopathy. Does not bruise/bleed easily.  Psychiatric/Behavioral: Positive for sleep disturbance and dysphoric mood. Negative for suicidal ideas, hallucinations, confusion, self-injury and decreased concentration. The patient is nervous/anxious. The patient is not hyperactive.     Objective:  BP 138/100 mmHg  Pulse 98  Temp(Src) 98.2 F (36.8 C) (Oral)  Resp 16  Wt 168 lb (76.204 kg)  SpO2 96%  BP Readings from Last 3 Encounters:  08/25/15 138/100  04/26/15 126/88  12/23/14 130/70    Wt Readings from Last 3 Encounters:  08/25/15 168 lb (76.204 kg)  04/26/15 163 lb (73.936 kg)  12/23/14 163 lb 12 oz (74.277 kg)    Physical Exam  Constitutional: She is oriented to person, place, and time. She appears well-developed and well-nourished. No distress.  HENT:  Mouth/Throat: Oropharynx is clear and moist. No oropharyngeal exudate.  Eyes: Conjunctivae are normal. Right eye exhibits no discharge. Left eye exhibits no discharge. No scleral icterus.  Neck: Normal range of motion. Neck supple. No JVD present. No tracheal deviation present. No thyromegaly present.  Cardiovascular: Normal rate, regular rhythm, normal heart sounds and intact distal pulses.  Exam reveals no gallop and no friction rub.   No murmur heard. EKG -  NSR, LBBB ? New Q wave in III Voltage loss in V5 and V6 that appears new  Pulmonary/Chest: Effort normal and breath sounds normal. No stridor. No respiratory distress. She has no wheezes. She has no rales. She exhibits no tenderness.  Abdominal: Soft. Bowel sounds are normal. She exhibits no distension and no mass. There is no tenderness. There is no rebound and no guarding.  Musculoskeletal: Normal range of motion. She exhibits edema (1+ pitting edema in BLE). She exhibits no tenderness.       Cervical back: Normal. She exhibits  normal range of motion, no tenderness, no bony tenderness, no swelling, no edema, no deformity, no laceration, no pain and no spasm.  Lymphadenopathy:    She has no cervical adenopathy.  Neurological: She is alert and oriented to person, place, and time. She has normal strength. She displays no atrophy and no tremor. No cranial nerve deficit or sensory deficit. She exhibits normal muscle tone. She displays a negative Romberg sign. She displays no seizure activity. Coordination and gait normal.  Skin: Skin is warm and dry. No rash noted. She is not diaphoretic. No erythema. No pallor.  Psychiatric: She has a normal mood and affect. Her behavior is normal. Judgment and thought content normal.  Vitals reviewed.   Lab Results  Component Value Date   WBC 15.1* 08/25/2015   HGB 11.9* 08/25/2015   HCT 36.4 08/25/2015   PLT 437.0* 08/25/2015   GLUCOSE 105* 08/25/2015   CHOL 178 04/26/2015   TRIG 116.0 04/26/2015  HDL 55.70 04/26/2015   LDLDIRECT 137.9 02/04/2013   LDLCALC 100* 04/26/2015   ALT 12 04/26/2015   AST 14 04/26/2015   NA 144 08/25/2015   K 3.9 08/25/2015   CL 109 08/25/2015   CREATININE 0.65 08/25/2015   BUN 16 08/25/2015   CO2 25 08/25/2015   TSH 2.47 04/26/2015   HGBA1C 6.7* 08/25/2015   MICROALBUR 1.3 09/13/2014     Assessment & Plan:   Tamecca was seen today for hypertension and diabetes.  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  Leukocytosis- white blood cell count remains  elevated, labs ordered to screen for lymphoproliferative disease -     Cancel: SPEP & IFE with QIG; Future -     CBC with Differential/Platelet; Future -     Immunophenotyping by Flow Cytometry; Future -     Cancel: Protein,Total and Elect and IFE,24; Future  Essential hypertension- her blood pressure is not well controlled and she appears to have sodium retention, will add a diuretic to the ARB regimen. -     Basic metabolic panel; Future -     hydrochlorothiazide (MICROZIDE) 12.5 MG capsule; Take 1 capsule (12.5 mg total) by mouth daily.  Type 2 diabetes mellitus with complication, without long-term current use of insulin (Lydia)- her blood sugars are adequately well controlled -     Hemoglobin A1c; Future -     Basic metabolic panel; Future  Generalized edema- her BNP is slightly elevated, will start a diuretic and have asked her to see cardiology -     EKG 12-Lead -     hydrochlorothiazide (MICROZIDE) 12.5 MG capsule; Take 1 capsule (12.5 mg total) by mouth daily. -     Brain natriuretic peptide; Future  LBBB (left bundle branch block)- she appears to have some new changes and has no symptoms, her BNP is slightly elevated so Avastin to follow-up with cardiology. -     Ambulatory referral to Cardiology -     Brain natriuretic peptide; Future  Neck pain on left side -     DG Cervical Spine Complete; Future   I have discontinued Ms. Mikels's insulin glargine. I am also having her start on hydrochlorothiazide. Additionally, I am having her maintain her aspirin, Doxepin HCl, multivitamin, Cyanocobalamin (VITAMIN B12 PO), Probiotic Product ([place]), Pyridoxine HCl (VITAMIN B-6 PO), docusate sodium, B-D ULTRAFINE III SHORT PEN, LANTUS SOLOSTAR, ONETOUCH VERIO FLEX SYSTEM, glucose blood, ONE TOUCH LANCETS, Cranberry, omeprazole, valsartan, pioglitazone, metFORMIN, Dulaglutide, buPROPion, sertraline, and HYDROcodone-acetaminophen.  Meds ordered this encounter  Medications    . HYDROcodone-acetaminophen (NORCO) 10-325 MG tablet    Sig: Take 1 tablet by mouth every 8 (eight) hours as needed.    Dispense:  90 tablet    Refill:  0  . hydrochlorothiazide (MICROZIDE) 12.5 MG capsule    Sig: Take 1 capsule (12.5 mg total) by mouth daily.    Dispense:  90 capsule    Refill:  1  Follow-up: Return in about 4 months (around 12/25/2015).  Scarlette Calico, MD

## 2015-08-25 NOTE — Patient Instructions (Signed)
Hypertension Hypertension, commonly called high blood pressure, is when the force of blood pumping through your arteries is too strong. Your arteries are the blood vessels that carry blood from your heart throughout your body. A blood pressure reading consists of a higher number over a lower number, such as 110/72. The higher number (systolic) is the pressure inside your arteries when your heart pumps. The lower number (diastolic) is the pressure inside your arteries when your heart relaxes. Ideally you want your blood pressure below 120/80. Hypertension forces your heart to work harder to pump blood. Your arteries may become narrow or stiff. Having untreated or uncontrolled hypertension can cause heart attack, stroke, kidney disease, and other problems. RISK FACTORS Some risk factors for high blood pressure are controllable. Others are not.  Risk factors you cannot control include:   Race. You may be at higher risk if you are African American.  Age. Risk increases with age.  Gender. Men are at higher risk than women before age 45 years. After age 65, women are at higher risk than men. Risk factors you can control include:  Not getting enough exercise or physical activity.  Being overweight.  Getting too much fat, sugar, calories, or salt in your diet.  Drinking too much alcohol. SIGNS AND SYMPTOMS Hypertension does not usually cause signs or symptoms. Extremely high blood pressure (hypertensive crisis) may cause headache, anxiety, shortness of breath, and nosebleed. DIAGNOSIS To check if you have hypertension, your health care provider will measure your blood pressure while you are seated, with your arm held at the level of your heart. It should be measured at least twice using the same arm. Certain conditions can cause a difference in blood pressure between your right and left arms. A blood pressure reading that is higher than normal on one occasion does not mean that you need treatment. If  it is not clear whether you have high blood pressure, you may be asked to return on a different day to have your blood pressure checked again. Or, you may be asked to monitor your blood pressure at home for 1 or more weeks. TREATMENT Treating high blood pressure includes making lifestyle changes and possibly taking medicine. Living a healthy lifestyle can help lower high blood pressure. You may need to change some of your habits. Lifestyle changes may include:  Following the DASH diet. This diet is high in fruits, vegetables, and whole grains. It is low in salt, red meat, and added sugars.  Keep your sodium intake below 2,300 mg per day.  Getting at least 30-45 minutes of aerobic exercise at least 4 times per week.  Losing weight if necessary.  Not smoking.  Limiting alcoholic beverages.  Learning ways to reduce stress. Your health care provider may prescribe medicine if lifestyle changes are not enough to get your blood pressure under control, and if one of the following is true:  You are 18-59 years of age and your systolic blood pressure is above 140.  You are 60 years of age or older, and your systolic blood pressure is above 150.  Your diastolic blood pressure is above 90.  You have diabetes, and your systolic blood pressure is over 140 or your diastolic blood pressure is over 90.  You have kidney disease and your blood pressure is above 140/90.  You have heart disease and your blood pressure is above 140/90. Your personal target blood pressure may vary depending on your medical conditions, your age, and other factors. HOME CARE INSTRUCTIONS    Have your blood pressure rechecked as directed by your health care provider.   Take medicines only as directed by your health care provider. Follow the directions carefully. Blood pressure medicines must be taken as prescribed. The medicine does not work as well when you skip doses. Skipping doses also puts you at risk for  problems.  Do not smoke.   Monitor your blood pressure at home as directed by your health care provider. SEEK MEDICAL CARE IF:   You think you are having a reaction to medicines taken.  You have recurrent headaches or feel dizzy.  You have swelling in your ankles.  You have trouble with your vision. SEEK IMMEDIATE MEDICAL CARE IF:  You develop a severe headache or confusion.  You have unusual weakness, numbness, or feel faint.  You have severe chest or abdominal pain.  You vomit repeatedly.  You have trouble breathing. MAKE SURE YOU:   Understand these instructions.  Will watch your condition.  Will get help right away if you are not doing well or get worse.   This information is not intended to replace advice given to you by your health care provider. Make sure you discuss any questions you have with your health care provider.   Document Released: 06/11/2005 Document Revised: 10/26/2014 Document Reviewed: 04/03/2013 Elsevier Interactive Patient Education 2016 [employer]  

## 2015-08-26 LAB — PROTEIN,TOTAL AND ELECT AND IFE,24

## 2015-08-26 LAB — IMMUNOPHENOTYPING BY FLOW CYTOMETRY: Viability: 88 %

## 2015-08-26 LAB — REFLEX BLAST SCREEN

## 2015-08-29 LAB — PROTEIN ELECTROPHORESIS, SERUM, WITH REFLEX
ALBUMIN ELP: 3.9 g/dL (ref 3.8–4.8)
ALBUMIN ELP: 3.9 g/dL (ref 3.8–4.8)
ALPHA-1-GLOBULIN: 0.3 g/dL (ref 0.2–0.3)
ALPHA-1-GLOBULIN: 0.3 g/dL (ref 0.2–0.3)
Alpha-2-Globulin: 0.9 g/dL (ref 0.5–0.9)
Alpha-2-Globulin: 0.9 g/dL (ref 0.5–0.9)
BETA 2: 0.3 g/dL (ref 0.2–0.5)
BETA 2: 0.3 g/dL (ref 0.2–0.5)
BETA GLOBULIN: 0.4 g/dL (ref 0.4–0.6)
Beta Globulin: 0.5 g/dL (ref 0.4–0.6)
Gamma Globulin: 0.7 g/dL — ABNORMAL LOW (ref 0.8–1.7)
Gamma Globulin: 0.7 g/dL — ABNORMAL LOW (ref 0.8–1.7)
TOTAL PROTEIN, SERUM ELECTROPHOR: 6.6 g/dL (ref 6.1–8.1)
TOTAL PROTEIN, SERUM ELECTROPHOR: 6.5 g/dL (ref 6.1–8.1)

## 2015-08-29 NOTE — Addendum Note (Signed)
Addended by: Janith Lima on: 08/29/2015 08:12 AM   Modules accepted: Miquel Dunn

## 2015-09-22 ENCOUNTER — Ambulatory Visit (INDEPENDENT_AMBULATORY_CARE_PROVIDER_SITE_OTHER): Payer: Medicare Other | Admitting: Cardiovascular Disease

## 2015-09-22 ENCOUNTER — Encounter: Payer: Self-pay | Admitting: Cardiovascular Disease

## 2015-09-22 VITALS — BP 94/62 | HR 70 | Ht 63.0 in | Wt 160.6 lb

## 2015-09-22 DIAGNOSIS — I10 Essential (primary) hypertension: Secondary | ICD-10-CM

## 2015-09-22 DIAGNOSIS — R609 Edema, unspecified: Secondary | ICD-10-CM | POA: Diagnosis not present

## 2015-09-22 DIAGNOSIS — R600 Localized edema: Secondary | ICD-10-CM | POA: Diagnosis not present

## 2015-09-22 DIAGNOSIS — R0789 Other chest pain: Secondary | ICD-10-CM

## 2015-09-22 DIAGNOSIS — I447 Left bundle-branch block, unspecified: Secondary | ICD-10-CM | POA: Diagnosis not present

## 2015-09-22 DIAGNOSIS — R079 Chest pain, unspecified: Secondary | ICD-10-CM | POA: Insufficient documentation

## 2015-09-22 LAB — BASIC METABOLIC PANEL
BUN: 29 mg/dL — ABNORMAL HIGH (ref 7–25)
CHLORIDE: 102 mmol/L (ref 98–110)
CO2: 26 mmol/L (ref 20–31)
Calcium: 10.3 mg/dL (ref 8.6–10.4)
Creat: 1.1 mg/dL — ABNORMAL HIGH (ref 0.50–0.99)
GLUCOSE: 113 mg/dL — AB (ref 65–99)
POTASSIUM: 4.1 mmol/L (ref 3.5–5.3)
SODIUM: 141 mmol/L (ref 135–146)

## 2015-09-22 NOTE — Patient Instructions (Signed)
Medication Instructions:  STOP HCTZ (hydrochlorothiazide)   Labwork: TODAY - basic metabolic panel   Testing/Procedures: Your physician has requested that you have an echocardiogram. Echocardiography is a painless test that uses sound waves to create images of your heart. It provides your doctor with information about the size and shape of your heart and how well your heart's chambers and valves are working. This procedure takes approximately one hour. There are no restrictions for this procedure.  Your physician has requested that you have a lexiscan myoview. For further information please visit [email or web address]. Please follow instruction sheet, as given.    Follow-Up: Your physician recommends that you schedule a follow-up appointment in: 3 months with Dr. Acie Fredrickson.    If you need a refill on your cardiac medications before your next appointment, please call your pharmacy.   Thank you for choosing [facility]! Christen Bame, RN [phone]

## 2015-09-22 NOTE — Progress Notes (Signed)
Primary cardiologist - Shima Compere, previous Margaret Norman patient   Problem List  1. Chest pain  2. LBBB 3. Diabetes Mellitus  4. Essential Hypertension 5. Hyperlipidemia   HPI   Margaret Norman is seen today for initial visit .  Previous patient of Dr. Ron Norman. Seen with husband , Margaret Norman  Has been having some ankle and leg swelling . BP had been higher.   Had been having lots of anxiety . Had stopped taking the Invokana  Was started on HCTZ 12.5 a day BP is better,  Has had some dizziness with that. Leg edema has resolved.   Has + family hx of heart disease Brother has HTN and LVH Father had an aortic issue.   Has been fatigued.  Has occasional PVC No CP .   Has some discomfort across her back  Does not get any regular exercise,   Gets short of breath with household chores.    Allergies  Allergen Reactions  . Invokana [Canagliflozin] Other (See Comments)    UTI's  . Benicar [Olmesartan]     GI upset  . Amoxicillin     Sore tongue    Current Outpatient Prescriptions  Medication Sig Dispense Refill  . aspirin 81 MG EC tablet Take 81 mg by mouth daily.      . B-D ULTRAFINE III SHORT PEN 31G X 8 MM MISC USE DAILY WITH LANTUS INSULIN 1 each 2  . Blood Glucose Monitoring Suppl (ONETOUCH VERIO FLEX SYSTEM) W/DEVICE KIT 1 kit by Does not apply route 3 (three) times daily. 1 kit 1  . buPROPion (WELLBUTRIN XL) 150 MG 24 hr tablet Take 1 tablet (150 mg total) by mouth every morning. 90 tablet 3  . Cranberry (ELLURA) 200 MG CAPS Take 1 capsule by mouth at bedtime.    . Cyanocobalamin (VITAMIN B12 PO) Take 2,500 mcg by mouth daily.    Marland Kitchen docusate sodium (COLACE) 100 MG capsule Take 100 mg by mouth daily.    . Doxepin HCl (SILENOR) 6 MG TABS Take 6 mg by mouth at bedtime as needed. For sleep    . Dulaglutide (TRULICITY) 1.5 ZO/1.0RU SOPN INJECT 1 PEN INTO THE SKIN ONCE A WEEK 12 pen 2  . Estradiol (VAGIFEM) 10 MCG TABS vaginal tablet Place 1 tablet vaginally 2 (two) times a week.    .  fluticasone (FLONASE) 50 MCG/ACT nasal spray Place 1 spray into both nostrils daily.    Marland Kitchen glucose blood (ONETOUCH VERIO) test strip Test three times daily as directed 300 each 1  . hydrochlorothiazide (MICROZIDE) 12.5 MG capsule Take 1 capsule (12.5 mg total) by mouth daily. 90 capsule 1  . HYDROcodone-acetaminophen (NORCO) 10-325 MG tablet Take 1 tablet by mouth every 8 (eight) hours as needed. 90 tablet 0  . LANTUS SOLOSTAR 100 UNIT/ML Solostar Pen INJECT 30 UNITS INTO THE SKIN AT BEDTIME 9 mL 3  . metFORMIN (GLUCOPHAGE) 1000 MG tablet Take 1 tablet (1,000 mg total) by mouth 2 (two) times daily with a meal. 60 tablet 3  . mometasone-formoterol (DULERA) 100-5 MCG/ACT AERO Inhale 2 puffs into the lungs 2 (two) times daily.    . Multiple Vitamin (MULTIVITAMIN) capsule Take 1 capsule by mouth daily.    Marland Kitchen omeprazole (PRILOSEC) 40 MG capsule Take 1 capsule (40 mg total) by mouth daily. 90 capsule 3  . ONE TOUCH LANCETS MISC Test three times daily as directed 300 each 3  . pioglitazone (ACTOS) 30 MG tablet Take 1 tablet (30 mg total) by mouth  daily. 90 tablet 2  . Probiotic Product (PHILLIPS COLON HEALTH PO) Take 1,000 mg by mouth every evening.    . Pyridoxine HCl (VITAMIN B-6 PO) Take 1 tablet by mouth daily.     . sertraline (ZOLOFT) 50 MG tablet Take 1 tablet (50 mg total) by mouth daily. 90 tablet 3  . valsartan (DIOVAN) 320 MG tablet Take 1 tablet (320 mg total) by mouth daily. 90 tablet 3   No current facility-administered medications for this visit.    Social History   Social History  . Marital Status: Married    Spouse Name: Margaret Norman  . Number of Children: 2  . Years of Education: N/A   Occupational History  . 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 Smoking status: Never Smoker   . Smokeless tobacco: Never Used  . Alcohol Use: No  . Drug Use: No  . Sexual Activity: Not Currently   Other Topics Concern  . Not on file   Social History  Narrative   Regular Exercise -  NO    Family History  Problem Relation Age of Onset  . Stroke Maternal Grandfather   . Coronary artery disease Father   . Heart attack Father   . Hypertension Father   . Hyperlipidemia Brother   . Diabetes Maternal Grandmother   . Liver disease Mother   . Dementia Mother   . Heart disease Father   . Cancer Paternal Uncle     unknown  . Cancer Other     leukemia  . Heart attack Paternal Grandmother   . Heart attack Paternal Uncle   . Hypertension Brother   . Diabetes Mother     borderline  . Diabetes Daughter     borderline    Past Medical History  Diagnosis Date  . Type II or unspecified type diabetes mellitus without mention of complication, not stated as uncontrolled   . Depression   . GERD (gastroesophageal reflux disease)   . Hyperlipidemia   . Nephrolithiasis   . HTN (hypertension)   . Gallstones 11-06  . Chest pain     Nuclear, adenosine,  December, 2013, low risk nuclear scan with small, moderate in intensity, fixed anteroseptal defect. This is possibly related to an LBBB versus small prior infarct. No ischemia  . LBBB (left bundle branch block)   . 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 Kidney mass 01/28/2014  . Arthritis   . Pneumonia     Past Surgical History  Procedure Laterality Date  . Tonsillectomy and adenoidectomy    . Cesarean section      x2 ? w/appy  . Knee arthroscopy Right 11/2005  . Splenectomy    . Esophagogastroduodenoscopy    . Hernia repair    . Pancreatic cyst excision    . Cholecystectomy    . Liver biopsy      Patient Active Problem List   Diagnosis Date Noted  . Neck pain on left side 08/25/2015  . Generalized edema 08/25/2015  . Routine general medical examination at a health care facility 04/27/2015  . Abdominal wall hernia 04/26/2015  . Family history of hemochromatosis 09/13/2014  . Personal history of colonic polyps 03/10/2014  . 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  Lumbosacral spondylosis without myelopathy 05/12/2013  . Insomnia, persistent 02/04/2013  . Obesity (BMI 30-39.9) 02/04/2013  . Unspecified asthma, with exacerbation 09/22/2012  . Visit for screening mammogram 07/04/2012  . Nephrolithiasis   .  HTN (hypertension)   . Gallstones   . LBBB (left bundle branch block)   . Leukocytosis 12/20/2008  . Right lumbar radiculitis 07/12/2008  . Type II diabetes mellitus with manifestations ([place]) 09/04/2007  . Hyperlipidemia with target LDL less than 100 09/04/2007  . DEPRESSION 09/04/2007  . GERD 09/04/2007    ROS   Patient denies fever, chills, headache, sweats, rash, change in vision, change in hearing, chest pain, cough, nausea vomiting, urinary symptoms. All other systems are reviewed and are negative.  PHYSICAL EXAM   Filed Vitals:   09/22/15 0906  BP: 94/62  Pulse: 70  Height: _0  (1.6 m)  Weight: 160 lb 9.6 oz (72.848 kg)   Physical Exam: Blood pressure 94/62, pulse 70, height _1  (1.6 m), weight 160 lb 9.6 oz (72.848 kg). General: Well developed, well nourished, in no acute distress. Head: Normocephalic, atraumatic, sclera non-icteric, mucus membranes are moist Neck: Supple. Negative for carotid bruits. JVD not elevated. Lungs: Clear bilaterally to auscultation without wheezes, rales, or rhonchi. Breathing is unlabored. Heart: RRR with S1 S2. No murmurs, rubs, or gallops appreciated. Abdomen: Soft, non-tender, non-distended with normoactive bowel sounds. No hepatomegaly. No rebound/guarding. No obvious abdominal masses. Msk:  Strength and tone appear normal for age. Extremities: No clubbing or cyanosis. No edema.  Distal pedal pulses are 2+ and equal bilaterally. Neuro: Alert and oriented X 3. Moves all extremities spontaneously. Psych:  Responds to questions appropriately with a normal affect.  ASSESSMENT & PLAN  1. Chest pain She's had some episodes of chest pain/interscapular pain. She has a history of left bundle branch  block and a strong family history of cardiac disease. We'll schedule her for a [employer] study for further evaluation. -  2. LBBB 3. Diabetes Mellitus  4. Essential Hypertension - her blood pressure was elevated when she saw her primary medical doctor several weeks ago. She now clearly is volume depleted. We'll have her stop the HCTZ for now. Continue the valsartan. We will check a basic medical profile today. I would like to do an echocardiogram for further assessment of her left ventricular function and valvular function.  5. Hyperlipidemia    Myleka Moncure, [name], MD  09/22/2015 9:47 AM    [facility] [place],  [place], [place]  [address] Pager [phone] Phone: [phone]; Fax: [phone]   [facility]  [address] [place], [place]  [address] [phone]   Fax [phone]

## 2015-09-25 ENCOUNTER — Other Ambulatory Visit: Payer: Self-pay | Admitting: Internal Medicine

## 2015-09-26 ENCOUNTER — Encounter: Payer: Self-pay | Admitting: Internal Medicine

## 2015-09-26 DIAGNOSIS — N390 Urinary tract infection, site not specified: Secondary | ICD-10-CM | POA: Diagnosis not present

## 2015-09-26 DIAGNOSIS — N3000 Acute cystitis without hematuria: Secondary | ICD-10-CM | POA: Diagnosis not present

## 2015-09-26 DIAGNOSIS — Z0000 Encounter for general adult medical examination without abnormal findings: Secondary | ICD-10-CM | POA: Diagnosis not present

## 2015-10-05 ENCOUNTER — Telehealth (HOSPITAL_COMMUNITY): Payer: Self-pay | Admitting: *Deleted

## 2015-10-05 NOTE — Telephone Encounter (Signed)
Left message on voicemail in reference to upcoming appointment scheduled for 10/11/15. Phone number given for a call back so details instructions can be given. Bernadetta Roell J Ganon Demasi, RN 

## 2015-10-11 ENCOUNTER — Ambulatory Visit (HOSPITAL_COMMUNITY): Payer: Medicare Other | Attending: Internal Medicine

## 2015-10-11 ENCOUNTER — Other Ambulatory Visit: Payer: Self-pay

## 2015-10-11 ENCOUNTER — Ambulatory Visit (HOSPITAL_BASED_OUTPATIENT_CLINIC_OR_DEPARTMENT_OTHER): Payer: Medicare Other

## 2015-10-11 DIAGNOSIS — R600 Localized edema: Secondary | ICD-10-CM

## 2015-10-11 DIAGNOSIS — I340 Nonrheumatic mitral (valve) insufficiency: Secondary | ICD-10-CM | POA: Insufficient documentation

## 2015-10-11 DIAGNOSIS — I10 Essential (primary) hypertension: Secondary | ICD-10-CM

## 2015-10-11 DIAGNOSIS — E119 Type 2 diabetes mellitus without complications: Secondary | ICD-10-CM | POA: Insufficient documentation

## 2015-10-11 DIAGNOSIS — I447 Left bundle-branch block, unspecified: Secondary | ICD-10-CM

## 2015-10-11 DIAGNOSIS — R0789 Other chest pain: Secondary | ICD-10-CM | POA: Diagnosis not present

## 2015-10-11 DIAGNOSIS — E785 Hyperlipidemia, unspecified: Secondary | ICD-10-CM | POA: Insufficient documentation

## 2015-10-11 DIAGNOSIS — I119 Hypertensive heart disease without heart failure: Secondary | ICD-10-CM | POA: Insufficient documentation

## 2015-10-11 DIAGNOSIS — R9439 Abnormal result of other cardiovascular function study: Secondary | ICD-10-CM | POA: Insufficient documentation

## 2015-10-11 LAB — MYOCARDIAL PERFUSION IMAGING
CHL CUP RESTING HR STRESS: 73 {beats}/min
CSEPPHR: 115 {beats}/min
LHR: 0.27
LVDIAVOL: 110 mL (ref 46–106)
LVSYSVOL: 57 mL
SDS: 1
SRS: 5
SSS: 6
TID: 1

## 2015-10-11 MED ORDER — REGADENOSON 0.4 MG/5ML IV SOLN
0.4000 mg | Freq: Once | INTRAVENOUS | Status: AC
  Administered 2015-10-11: 0.4 mg via INTRAVENOUS

## 2015-10-11 MED ORDER — TECHNETIUM TC 99M SESTAMIBI GENERIC - CARDIOLITE
10.8000 | Freq: Once | INTRAVENOUS | Status: AC | PRN
  Administered 2015-10-11: 11 via INTRAVENOUS

## 2015-10-11 MED ORDER — TECHNETIUM TC 99M SESTAMIBI GENERIC - CARDIOLITE
32.9000 | Freq: Once | INTRAVENOUS | Status: AC | PRN
  Administered 2015-10-11: 32.9 via INTRAVENOUS

## 2015-10-18 ENCOUNTER — Encounter: Payer: Self-pay | Admitting: Internal Medicine

## 2015-10-18 ENCOUNTER — Other Ambulatory Visit: Payer: Self-pay | Admitting: Internal Medicine

## 2015-10-18 DIAGNOSIS — M5416 Radiculopathy, lumbar region: Secondary | ICD-10-CM

## 2015-10-18 MED ORDER — HYDROCODONE-ACETAMINOPHEN 10-325 MG PO TABS: 1.0000 | ORAL_TABLET | Freq: Three times a day (TID) | ORAL | Status: DC | PRN

## 2015-10-18 NOTE — Telephone Encounter (Signed)
Sent pt mychart msg back stating rx ready for pick-up,,,/lmb

## 2015-11-14 DIAGNOSIS — M8588 Other specified disorders of bone density and structure, other site: Secondary | ICD-10-CM | POA: Diagnosis not present

## 2015-11-14 DIAGNOSIS — N958 Other specified menopausal and perimenopausal disorders: Secondary | ICD-10-CM | POA: Diagnosis not present

## 2015-11-14 LAB — HM MAMMOGRAPHY: HM MAMMO: NORMAL (ref 0–4)

## 2015-11-14 LAB — HM DEXA SCAN

## 2015-11-30 ENCOUNTER — Encounter: Payer: Self-pay | Admitting: Internal Medicine

## 2015-11-30 ENCOUNTER — Other Ambulatory Visit: Payer: Self-pay | Admitting: Internal Medicine

## 2015-11-30 DIAGNOSIS — M5416 Radiculopathy, lumbar region: Secondary | ICD-10-CM

## 2015-11-30 MED ORDER — HYDROCODONE-ACETAMINOPHEN 10-325 MG PO TABS: 1.0000 | ORAL_TABLET | Freq: Three times a day (TID) | ORAL | Status: DC | PRN

## 2015-12-01 NOTE — Telephone Encounter (Signed)
Place rx up front for p/u...Margaret Norman

## 2015-12-06 ENCOUNTER — Other Ambulatory Visit: Payer: Self-pay | Admitting: Internal Medicine

## 2015-12-06 DIAGNOSIS — N3020 Other chronic cystitis without hematuria: Secondary | ICD-10-CM | POA: Diagnosis not present

## 2015-12-22 ENCOUNTER — Ambulatory Visit (INDEPENDENT_AMBULATORY_CARE_PROVIDER_SITE_OTHER): Payer: Medicare Other | Admitting: Cardiovascular Disease

## 2015-12-22 ENCOUNTER — Encounter: Payer: Self-pay | Admitting: Cardiovascular Disease

## 2015-12-22 VITALS — BP 110/80 | HR 90 | Ht 63.0 in | Wt 169.1 lb

## 2015-12-22 DIAGNOSIS — I447 Left bundle-branch block, unspecified: Secondary | ICD-10-CM

## 2015-12-22 DIAGNOSIS — R0789 Other chest pain: Secondary | ICD-10-CM | POA: Diagnosis not present

## 2015-12-22 DIAGNOSIS — I10 Essential (primary) hypertension: Secondary | ICD-10-CM | POA: Diagnosis not present

## 2015-12-22 NOTE — Patient Instructions (Signed)
Medication Instructions:  None  Labwork: None  Testing/Procedures: None  Follow-Up: Your physician wants you to follow-up in: 1 year with Dr. Acie Fredrickson.  You will receive a reminder letter in the mail two months in advance. If you don't receive a letter, please call our office to schedule the follow-up appointment.   Any Other Special Instructions Will Be Listed Below (If Applicable).     If you need a refill on your cardiac medications before your next appointment, please call your pharmacy.

## 2015-12-22 NOTE — Progress Notes (Signed)
Primary cardiologist - Vikas Wegmann, previous Ron Parker patient   Problem List  1. Chest pain  2. LBBB 3. Diabetes Mellitus  4. Essential Hypertension 5. Hyperlipidemia   HPI   Margaret Norman is seen today for initial visit .  Previous patient of Dr. Ron Parker. Seen with husband , Margaret Norman  Has been having some ankle and leg swelling . BP had been higher.   Had been having lots of anxiety . Had stopped taking the Invokana  Was started on HCTZ 12.5 a day BP is better,  Has had some dizziness with that. Leg edema has resolved.   Has + family hx of heart disease Brother has HTN and LVH Father had an aortic issue.   Has been fatigued.  Has occasional PVC No CP .   Has some discomfort across her back  Does not get any regular exercise,   Gets short of breath with household chores.   12/22/2015: Margaret Norman is seen back today for follow-up visit. She was having some chest discomfort when we initially saw her several months ago.  She was on depleted at her initial visit. We stopped her HCTZ at that time. Stress Myoview study revealed no evidence of ischemia. Ejection fraction was 48% by nuclear study. By echocardiography, her EF was 50-55%. She does have grade 1 diastolic dysfunction.  Still having some issues with chest pain .  Worse with mental stress.   Allergies  Allergen Reactions  . Invokana [Canagliflozin] Other (See Comments)    UTI's  . Benicar [Olmesartan]     GI upset  . Amoxicillin     Sore tongue    Current Outpatient Prescriptions  Medication Sig Dispense Refill  . aspirin 81 MG EC tablet Take 81 mg by mouth daily.      . B-D ULTRAFINE III SHORT PEN 31G X 8 MM MISC USE DAILY WITH LANTUS INSULIN 1 each 2  . Blood Glucose Monitoring Suppl (ONETOUCH VERIO FLEX SYSTEM) W/DEVICE KIT 1 kit by Does not apply route 3 (three) times daily. 1 kit 1  . buPROPion (WELLBUTRIN XL) 150 MG 24 hr tablet Take 1 tablet (150 mg total) by mouth every morning. 90 tablet 3  . Cranberry (ELLURA) 200  MG CAPS Take 1 capsule by mouth at bedtime.    . Cyanocobalamin (VITAMIN B12 PO) Take 2,500 mcg by mouth daily.    . cyclobenzaprine (FLEXERIL) 10 MG tablet Take 10 mg by mouth daily as needed. AS NEEDED FOR MUSCLE SPASMS    . docusate sodium (COLACE) 100 MG capsule Take 100 mg by mouth daily.    . Doxepin HCl (SILENOR) 6 MG TABS Take 6 mg by mouth at bedtime as needed. For sleep    . Dulaglutide (TRULICITY) 1.5 OF/1.2RF SOPN INJECT 1 PEN INTO THE SKIN ONCE A WEEK 12 pen 2  . Estradiol (VAGIFEM) 10 MCG TABS vaginal tablet Place 1 tablet vaginally 2 (two) times a week.    . fluticasone (FLONASE) 50 MCG/ACT nasal spray Place 1 spray into both nostrils daily.    Marland Kitchen glucose blood (ONETOUCH VERIO) test strip Test three times daily as directed 300 each 1  . HYDROcodone-acetaminophen (NORCO) 10-325 MG tablet Take 1 tablet by mouth every 8 (eight) hours as needed. 90 tablet 0  . LANTUS SOLOSTAR 100 UNIT/ML Solostar Pen INJECT 30 UNITS INTO THE SKIN AT BEDTIME 9 mL 3  . metFORMIN (GLUCOPHAGE) 1000 MG tablet Take 1 tablet by mouth two  times daily with meals 180 tablet 2  .  Multiple Vitamin (MULTIVITAMIN) capsule Take 1 capsule by mouth daily.    Marland Kitchen omeprazole (PRILOSEC) 40 MG capsule Take 1 capsule (40 mg total) by mouth daily. 90 capsule 3  . ONE TOUCH LANCETS MISC Test three times daily as directed 300 each 3  . pioglitazone (ACTOS) 30 MG tablet Take 1 tablet (30 mg total) by mouth daily. 90 tablet 2  . Probiotic Product (PHILLIPS COLON HEALTH PO) Take 1,000 mg by mouth every evening.    . Pyridoxine HCl (VITAMIN B-6 PO) Take 1 tablet by mouth daily.     . sertraline (ZOLOFT) 50 MG tablet Take 1 tablet (50 mg total) by mouth daily. 90 tablet 3  . valsartan (DIOVAN) 320 MG tablet Take 1 tablet (320 mg total) by mouth daily. 90 tablet 3  . mometasone-formoterol (DULERA) 100-5 MCG/ACT AERO Inhale 2 puffs into the lungs 2 (two) times daily. Reported on 12/22/2015     No current facility-administered  medications for this visit.    Social History   Social History  . Marital Status: Married    Spouse Name: Margaret Norman  . Number of Children: 2  . Years of Education: N/A   Occupational History  . 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 Smoking status: Never Smoker   . Smokeless tobacco: Never Used  . Alcohol Use: No  . Drug Use: No  . Sexual Activity: Not Currently   Other Topics Concern  . Not on file   Social History Narrative   Regular Exercise -  NO    Family History  Problem Relation Age of Onset  . Stroke Maternal Grandfather   . Coronary artery disease Father   . Heart attack Father   . Hypertension Father   . Hyperlipidemia Brother   . Diabetes Maternal Grandmother   . Liver disease Mother   . Dementia Mother   . Heart disease Father   . Cancer Paternal Uncle     unknown  . Cancer Other     leukemia  . Heart attack Paternal Grandmother   . Heart attack Paternal Uncle   . Hypertension Brother   . Diabetes Mother     borderline  . Diabetes Daughter     borderline    Past Medical History  Diagnosis Date  . Type II or unspecified type diabetes mellitus without mention of complication, not stated as uncontrolled   . Depression   . GERD (gastroesophageal reflux disease)   . Hyperlipidemia   . Nephrolithiasis   . HTN (hypertension)   . Gallstones 11-06  . Chest pain     Nuclear, adenosine,  December, 2013, low risk nuclear scan with small, moderate in intensity, fixed anteroseptal defect. This is possibly related to an LBBB versus small prior infarct. No ischemia  . LBBB (left bundle branch block)   . 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 Kidney mass 01/28/2014  . Arthritis   . Pneumonia     Past Surgical History  Procedure Laterality Date  . Tonsillectomy and adenoidectomy    . Cesarean section      x2 ? w/appy  . Knee arthroscopy Right 11/2005  . Splenectomy    . Esophagogastroduodenoscopy    . Hernia  repair    . Pancreatic cyst excision    . Cholecystectomy    . Liver biopsy      Patient Active Problem List   Diagnosis Date Noted  . LBBB (left bundle branch block)  Priority: High  . Chest pain 09/22/2015  . Neck pain on left side 08/25/2015  . Generalized edema 08/25/2015  . Routine general medical examination at a health care facility 04/27/2015  . Abdominal wall hernia 04/26/2015  . Family history of hemochromatosis 09/13/2014  . Personal history of colonic polyps 03/10/2014  . 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 Lumbosacral spondylosis without myelopathy 05/12/2013  . Insomnia, persistent 02/04/2013  . Obesity (BMI 30-39.9) 02/04/2013  . Unspecified asthma, with exacerbation 09/22/2012  . Visit for screening mammogram 07/04/2012  . Nephrolithiasis   . HTN (hypertension)   . Gallstones   . Leukocytosis 12/20/2008  . Right lumbar radiculitis 07/12/2008  . Type II diabetes mellitus with manifestations ([place]) 09/04/2007  . Hyperlipidemia with target LDL less than 100 09/04/2007  . DEPRESSION 09/04/2007  . GERD 09/04/2007    ROS   Patient denies fever, chills, headache, sweats, rash, change in vision, change in hearing, chest pain, cough, nausea vomiting, urinary symptoms. All other systems are reviewed and are negative.  PHYSICAL EXAM   Filed Vitals:   12/22/15 0924  BP: 110/80  Pulse: 90  Height: 5' 3"  (1.6 m)  Weight: 169 lb 1.9 oz (76.712 kg)  SpO2: 98%   Physical Exam: Blood pressure 110/80, pulse 90, height 5' 3"  (1.6 m), weight 169 lb 1.9 oz (76.712 kg), SpO2 98 %. General: Well developed, well nourished, in no acute distress. Head: Normocephalic, atraumatic, sclera non-icteric, mucus membranes are moist Neck: Supple. Negative for carotid bruits. JVD not elevated. Lungs: Clear bilaterally to auscultation without wheezes, rales, or rhonchi. Breathing is unlabored. Heart: RRR with S1 S2. No murmurs, rubs, or gallops appreciated. Abdomen:  Soft, non-tender, non-distended with normoactive bowel sounds. No hepatomegaly. No rebound/guarding. No obvious abdominal masses. Msk:  Strength and tone appear normal for age. Extremities: No clubbing or cyanosis. No edema.  Distal pedal pulses are 2+ and equal bilaterally. Neuro: Alert and oriented X 3. Moves all extremities spontaneously. Psych:  Responds to questions appropriately with a normal affect.  ASSESSMENT & PLAN  1. Chest pain  - seems to be more related to mental stress.    Also has some with mowing the grass I've recommended that she call me if this pain worsens.   2. LBBB 3. Diabetes Mellitus  4. Essential Hypertension - Her blood pressure is well-controlled today.  5. Hyperlipidemia  Will see see her in 1 year   Mertie Moores, MD  12/22/2015 9:35 AM    [facility] [place],  [place], [place]  [address] Pager [phone] Phone: [phone]; Fax: [phone]   [facility]  [address] [place], [place]  [address] [phone]   Fax [phone]

## 2015-12-29 ENCOUNTER — Encounter: Payer: Self-pay | Admitting: Internal Medicine

## 2015-12-29 ENCOUNTER — Other Ambulatory Visit (INDEPENDENT_AMBULATORY_CARE_PROVIDER_SITE_OTHER): Payer: Medicare Other

## 2015-12-29 ENCOUNTER — Ambulatory Visit (INDEPENDENT_AMBULATORY_CARE_PROVIDER_SITE_OTHER): Payer: Medicare Other | Admitting: Internal Medicine

## 2015-12-29 VITALS — BP 110/60 | HR 82 | Temp 98.2°F | Resp 16 | Wt 166.0 lb

## 2015-12-29 DIAGNOSIS — D75838 Other thrombocytosis: Secondary | ICD-10-CM

## 2015-12-29 DIAGNOSIS — R7989 Other specified abnormal findings of blood chemistry: Secondary | ICD-10-CM

## 2015-12-29 DIAGNOSIS — E669 Obesity, unspecified: Secondary | ICD-10-CM | POA: Diagnosis not present

## 2015-12-29 DIAGNOSIS — I10 Essential (primary) hypertension: Secondary | ICD-10-CM

## 2015-12-29 DIAGNOSIS — Z23 Encounter for immunization: Secondary | ICD-10-CM | POA: Diagnosis not present

## 2015-12-29 DIAGNOSIS — E785 Hyperlipidemia, unspecified: Secondary | ICD-10-CM

## 2015-12-29 DIAGNOSIS — M5416 Radiculopathy, lumbar region: Secondary | ICD-10-CM

## 2015-12-29 DIAGNOSIS — Z9081 Acquired absence of spleen: Secondary | ICD-10-CM | POA: Diagnosis not present

## 2015-12-29 DIAGNOSIS — E118 Type 2 diabetes mellitus with unspecified complications: Secondary | ICD-10-CM

## 2015-12-29 DIAGNOSIS — M5417 Radiculopathy, lumbosacral region: Secondary | ICD-10-CM | POA: Diagnosis not present

## 2015-12-29 LAB — COMPREHENSIVE METABOLIC PANEL
ALBUMIN: 4.5 g/dL (ref 3.5–5.2)
ALT: 16 U/L (ref 0–35)
AST: 13 U/L (ref 0–37)
Alkaline Phosphatase: 74 U/L (ref 39–117)
BUN: 17 mg/dL (ref 6–23)
CALCIUM: 10.4 mg/dL (ref 8.4–10.5)
CHLORIDE: 103 meq/L (ref 96–112)
CO2: 28 meq/L (ref 19–32)
CREATININE: 0.89 mg/dL (ref 0.40–1.20)
GFR: 67.53 mL/min (ref >60.00)
Glucose, Bld: 122 mg/dL — ABNORMAL HIGH (ref 70–99)
POTASSIUM: 5 meq/L (ref 3.5–5.1)
SODIUM: 140 meq/L (ref 135–145)
Total Bilirubin: 0.4 mg/dL (ref 0.2–1.2)
Total Protein: 7.5 g/dL (ref 6.0–8.3)

## 2015-12-29 LAB — CBC WITH DIFFERENTIAL/PLATELET
BASOS PCT: 0.8 % (ref 0.0–3.0)
Basophils Absolute: 0.1 10*3/uL (ref 0.0–0.1)
EOS ABS: 0.2 10*3/uL (ref 0.0–0.7)
EOS PCT: 1.1 % (ref 0.0–5.0)
HEMATOCRIT: 36.5 % (ref 36.0–46.0)
HEMOGLOBIN: 11.9 g/dL — AB (ref 12.0–15.0)
LYMPHS PCT: 35.4 % (ref 12.0–46.0)
Lymphs Abs: 5.5 10*3/uL — ABNORMAL HIGH (ref 0.7–4.0)
MCHC: 32.6 g/dL (ref 30.0–36.0)
MCV: 88.9 fl (ref 78.0–100.0)
Monocytes Absolute: 1.3 10*3/uL — ABNORMAL HIGH (ref 0.1–1.0)
Monocytes Relative: 8.7 % (ref 3.0–12.0)
NEUTROS ABS: 8.3 10*3/uL — AB (ref 1.4–7.7)
Neutrophils Relative %: 54 % (ref 43.0–77.0)
PLATELETS: 478 10*3/uL — AB (ref 150.0–400.0)
RBC: 4.11 Mil/uL (ref 3.87–5.11)
RDW: 13.8 % (ref 11.5–15.5)
WBC: 15.5 10*3/uL — AB (ref 4.0–10.5)

## 2015-12-29 LAB — LIPID PANEL
CHOLESTEROL: 178 mg/dL (ref 0–200)
HDL: 49.6 mg/dL (ref >39.00)
LDL Cholesterol: 102 mg/dL — ABNORMAL HIGH (ref 0–99)
NONHDL: 128.52
Total CHOL/HDL Ratio: 4
Triglycerides: 134 mg/dL (ref 0.0–149.0)
VLDL: 26.8 mg/dL (ref 0.0–40.0)

## 2015-12-29 LAB — URINALYSIS, ROUTINE W REFLEX MICROSCOPIC
Bilirubin Urine: NEGATIVE
HGB URINE DIPSTICK: NEGATIVE
KETONES UR: NEGATIVE
NITRITE: NEGATIVE
Specific Gravity, Urine: 1.01 (ref 1.000–1.030)
Total Protein, Urine: NEGATIVE
URINE GLUCOSE: NEGATIVE
UROBILINOGEN UA: 0.2 (ref 0.0–1.0)
pH: 5.5 (ref 5.0–8.0)

## 2015-12-29 LAB — MICROALBUMIN / CREATININE URINE RATIO
Creatinine,U: 70 mg/dL
Microalb Creat Ratio: 1.1 mg/g (ref 0.0–30.0)
Microalb, Ur: 0.8 mg/dL (ref 0.0–1.9)

## 2015-12-29 LAB — HEMOGLOBIN A1C: Hgb A1c MFr Bld: 6.6 % — ABNORMAL HIGH (ref 4.6–6.5)

## 2015-12-29 MED ORDER — HYDROCODONE-ACETAMINOPHEN 10-325 MG PO TABS: 1.0000 | ORAL_TABLET | Freq: Three times a day (TID) | ORAL | Status: DC | PRN

## 2015-12-29 NOTE — Progress Notes (Signed)
Pre visit review using our clinic review tool, if applicable. No additional management support is needed unless otherwise documented below in the visit note. 

## 2015-12-29 NOTE — Patient Instructions (Signed)

## 2015-12-29 NOTE — Progress Notes (Signed)
Subjective:  Patient ID: Margaret Norman, female    DOB: [birth year]  Age: 65 y.o. MRN: [id number]  CC: Hypertension; Diabetes; and Hyperlipidemia   HPI Margaret Norman presents for follow-up on the above problems. She suffers from chronic pain but otherwise today offers no complaints. She has been improving her lifestyle modifications and tells me that her blood sugars up and well controlled and she has been able to lose weight.  Outpatient Prescriptions Prior to Visit  Medication Sig Dispense Refill  . aspirin 81 MG EC tablet Take 81 mg by mouth daily.      . B-D ULTRAFINE III SHORT PEN 31G X 8 MM MISC USE DAILY WITH LANTUS INSULIN 1 each 2  . Blood Glucose Monitoring Suppl (ONETOUCH VERIO FLEX SYSTEM) W/DEVICE KIT 1 kit by Does not apply route 3 (three) times daily. 1 kit 1  . buPROPion (WELLBUTRIN XL) 150 MG 24 hr tablet Take 1 tablet (150 mg total) by mouth every morning. 90 tablet 3  . Cranberry (ELLURA) 200 MG CAPS Take 1 capsule by mouth at bedtime.    . Cyanocobalamin (VITAMIN B12 PO) Take 2,500 mcg by mouth daily.    . cyclobenzaprine (FLEXERIL) 10 MG tablet Take 10 mg by mouth daily as needed. AS NEEDED FOR MUSCLE SPASMS    . docusate sodium (COLACE) 100 MG capsule Take 100 mg by mouth daily.    . Doxepin HCl (SILENOR) 6 MG TABS Take 6 mg by mouth at bedtime as needed. For sleep    . Dulaglutide (TRULICITY) 1.5 QA/8.3MH SOPN INJECT 1 PEN INTO THE SKIN ONCE A WEEK 12 pen 2  . Estradiol (VAGIFEM) 10 MCG TABS vaginal tablet Place 1 tablet vaginally 2 (two) times a week.    . fluticasone (FLONASE) 50 MCG/ACT nasal spray Place 1 spray into both nostrils daily.    Marland Kitchen glucose blood (ONETOUCH VERIO) test strip Test three times daily as directed 300 each 1  . LANTUS SOLOSTAR 100 UNIT/ML Solostar Pen INJECT 30 UNITS INTO THE SKIN AT BEDTIME 9 mL 3  . metFORMIN (GLUCOPHAGE) 1000 MG tablet Take 1 tablet by mouth two  times daily with meals 180 tablet 2  . mometasone-formoterol (DULERA)  100-5 MCG/ACT AERO Inhale 2 puffs into the lungs 2 (two) times daily. Reported on 12/22/2015    . Multiple Vitamin (MULTIVITAMIN) capsule Take 1 capsule by mouth daily.    Marland Kitchen omeprazole (PRILOSEC) 40 MG capsule Take 1 capsule (40 mg total) by mouth daily. 90 capsule 3  . ONE TOUCH LANCETS MISC Test three times daily as directed 300 each 3  . pioglitazone (ACTOS) 30 MG tablet Take 1 tablet (30 mg total) by mouth daily. 90 tablet 2  . Probiotic Product ([place] COLON HEALTH PO) Take 1,000 mg by mouth every evening.    . Pyridoxine HCl (VITAMIN B-6 PO) Take 1 tablet by mouth daily.     . sertraline (ZOLOFT) 50 MG tablet Take 1 tablet (50 mg total) by mouth daily. 90 tablet 3  . valsartan (DIOVAN) 320 MG tablet Take 1 tablet (320 mg total) by mouth daily. 90 tablet 3  . HYDROcodone-acetaminophen (NORCO) 10-325 MG tablet Take 1 tablet by mouth every 8 (eight) hours as needed. 90 tablet 0   No facility-administered medications prior to visit.    ROS Review of Systems  Constitutional: Negative.  Negative for fever, chills, diaphoresis, appetite change and fatigue.  HENT: Negative.   Eyes: Negative.   Respiratory: Negative.  Negative for cough,  choking, shortness of breath and stridor.   Cardiovascular: Negative.  Negative for chest pain, palpitations and leg swelling.  Gastrointestinal: Negative.  Negative for nausea, vomiting, abdominal pain, diarrhea and constipation.  Endocrine: Negative.  Negative for polydipsia, polyphagia and polyuria.  Genitourinary: Negative.  Negative for difficulty urinating.  Musculoskeletal: Positive for back pain and arthralgias. Negative for myalgias, joint swelling and neck pain.  Skin: Negative.  Negative for color change, pallor and rash.  Allergic/Immunologic: Negative.   Neurological: Negative.  Negative for dizziness, weakness, light-headedness and numbness.  Hematological: Negative.  Negative for adenopathy. Does not bruise/bleed easily.    Psychiatric/Behavioral: Negative.     Objective:  BP 110/60 mmHg  Pulse 82  Temp(Src) 98.2 F (36.8 C) (Oral)  Resp 16  Wt 166 lb (75.297 kg)  SpO2 96%  BP Readings from Last 3 Encounters:  12/29/15 110/60  12/22/15 110/80  09/22/15 94/62    Wt Readings from Last 3 Encounters:  12/29/15 166 lb (75.297 kg)  12/22/15 169 lb 1.9 oz (76.712 kg)  10/11/15 160 lb (72.576 kg)    Physical Exam  Constitutional: She is oriented to person, place, and time. No distress.  HENT:  Mouth/Throat: Oropharynx is clear and moist. No oropharyngeal exudate.  Eyes: Conjunctivae are normal. Right eye exhibits no discharge. Left eye exhibits no discharge. No scleral icterus.  Neck: Normal range of motion. Neck supple. No JVD present. No tracheal deviation present. No thyromegaly present.  Cardiovascular: Normal rate, regular rhythm, normal heart sounds and intact distal pulses.  Exam reveals no gallop and no friction rub.   No murmur heard. Pulmonary/Chest: Effort normal and breath sounds normal. No stridor. No respiratory distress. She has no wheezes. She has no rales. She exhibits no tenderness.  Abdominal: Soft. Bowel sounds are normal. She exhibits no distension and no mass. There is no tenderness. There is no rebound and no guarding.  Musculoskeletal: Normal range of motion. She exhibits no edema or tenderness.  Lymphadenopathy:    She has no cervical adenopathy.  Neurological: She is oriented to person, place, and time.  Skin: Skin is warm and dry. No rash noted. She is not diaphoretic. No erythema. No pallor.  Vitals reviewed.   Lab Results  Component Value Date   WBC 15.5* 12/29/2015   HGB 11.9* 12/29/2015   HCT 36.5 12/29/2015   PLT 478.0* 12/29/2015   GLUCOSE 122* 12/29/2015   CHOL 178 12/29/2015   TRIG 134.0 12/29/2015   HDL 49.60 12/29/2015   LDLDIRECT 137.9 02/04/2013   LDLCALC 102* 12/29/2015   ALT 16 12/29/2015   AST 13 12/29/2015   NA 140 12/29/2015   K 5.0  12/29/2015   CL 103 12/29/2015   CREATININE 0.89 12/29/2015   BUN 17 12/29/2015   CO2 28 12/29/2015   TSH 2.47 04/26/2015   HGBA1C 6.6* 12/29/2015   MICROALBUR 0.8 12/29/2015    Ct Abdomen Pelvis W Contrast  01/21/2014  CLINICAL DATA:  Abdominal pain EXAM: CT ABDOMEN AND PELVIS WITH CONTRAST TECHNIQUE: Multidetector CT imaging of the abdomen and pelvis was performed using the standard protocol following bolus administration of intravenous contrast. Oral contrast was also administered CONTRAST:  161m OMNIPAQUE IOHEXOL 300 MG/ML  SOLN COMPARISON:  January 09, 2010 FINDINGS: Lung bases are clear. Liver is prominent, measuring 17.2 cm in length. No focal liver lesions are appreciable. Gallbladder is absent. There is no appreciable biliary duct dilatation. 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 A portion of the head remains as does the uncinate process. There is no pancreatic abnormality in the regions of the pancreas which remain. This appearance is stable compared to the prior study. Adrenals appear normal. There is a simple cyst in the medial mid right kidney measuring 1.4 by 1.2 cm. There is a simple cyst in the lateral right mid kidney measuring 1 x 1 cm. There is a mass arising from the lateral mid left kidney measuring 1 x 1 cm which has attenuation values consistent with a cyst. There is a mass arising from the upper pole of the left kidney anteriorly measuring 8 x 7 mm which has attenuation values higher than is expected with a simple cyst. There is a mass arising from the lower pole left kidney measuring 1.4 x 1.1 cm which has attenuation values higher than simple cyst which has increased slightly in size compared to the prior study. There is no hydronephrosis on either side. There is no renal or ureteral calculus on either side. Since the prior study, the patient has undergone ventral hernia repair. There is currently no appreciable  ventral hernia. In the pelvis, the urinary bladder is midline with normal wall thickness. There are sigmoid diverticula without diverticulitis. There is no pelvic mass or fluid collection. Appendix appears normal. There is no bowel obstruction. There is no free air or portal venous air. There is moderate stool throughout colon diffusely. There is no ascites, adenopathy, or abscess in the abdomen or pelvis. There is no evidence of abdominal aortic aneurysm. There is degenerative change in the lumbar spine. There are no blastic or lytic bone lesions. IMPRESSION: There is a small mass arising from the lower pole left kidney which has attenuation values slightly higher than simple cyst. This mass has become slightly larger compared to the previous study. Given this circumstance, further evaluation is warranted. Further evaluation with pre and post contrast MRI or CT should be considered. MRI is preferred in younger patients (due to lack of ionizing radiation) and for evaluating calcified lesion(s). Spleen is absent.  Patient has had a partial pancreatectomy, stable. Liver is prominent but otherwise appears unremarkable. Sigmoid diverticula without diverticulitis. No bowel obstruction. No abscess. Appendix appears normal. The patient has undergone ventral hernia repair; previously noted ventral hernia is no longer appreciable. There is some scarring in the anterior abdominal wall consistent with previous surgery for this hernia. Electronically Signed   By: Lowella Grip M.D.   On: 01/21/2014 17:44    Assessment & Plan:   Magen was seen today for hypertension, diabetes and hyperlipidemia.  Diagnoses and all orders for this visit:  Right lumbar radiculitis- she is getting pain relief with Norco -     HYDROcodone-acetaminophen (NORCO) 10-325 MG tablet; Take 1 tablet by mouth every 8 (eight) hours as needed.  Obesity (BMI 30-39.9)- she was praised for lifestyle modifications that it helped her lose weight,  she was asked to continue with the same modifications.  Type 2 diabetes mellitus with complication, without long-term current use of insulin ([place])- her A1c is down to 6.6%, her blood sugars are adequately well-controlled. -     Comprehensive metabolic panel; Future -     Hemoglobin A1c; Future -     Microalbumin / creatinine urine ratio; Future  Essential hypertension- her blood pressure is well-controlled, electrolytes and renal function are stable. 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 -     Comprehensive metabolic panel; Future -     CBC with Differential/Platelet; Future  Hyperlipidemia with target LDL less than 100- she is achieved her LDL goal is doing well on the statin. -     Lipid panel; Future  Need for prophylactic vaccination against Streptococcus pneumoniae (pneumococcus) -     Pneumococcal polysaccharide vaccine 23-valent greater than or equal to 2yo subcutaneous/IM   I am having Ms. Zane maintain her aspirin, Doxepin HCl, multivitamin, Cyanocobalamin (VITAMIN B12 PO), Probiotic Product ([place]), Pyridoxine HCl (VITAMIN B-6 PO), docusate sodium, B-D ULTRAFINE III SHORT PEN, LANTUS SOLOSTAR, ONETOUCH VERIO FLEX SYSTEM, glucose blood, ONE TOUCH LANCETS, omeprazole, valsartan, pioglitazone, Dulaglutide, buPROPion, sertraline, Estradiol, mometasone-formoterol, fluticasone, Cranberry, metFORMIN, cyclobenzaprine, and HYDROcodone-acetaminophen.  Meds ordered this encounter  Medications  . HYDROcodone-acetaminophen (NORCO) 10-325 MG tablet    Sig: Take 1 tablet by mouth every 8 (eight) hours as needed.    Dispense:  90 tablet    Refill:  0     Follow-up: Return in about 4 months (around 04/30/2016).  Scarlette Calico, MD

## 2016-02-05 ENCOUNTER — Other Ambulatory Visit: Payer: Self-pay | Admitting: Internal Medicine

## 2016-02-05 DIAGNOSIS — M5416 Radiculopathy, lumbar region: Secondary | ICD-10-CM

## 2016-02-06 MED ORDER — HYDROCODONE-ACETAMINOPHEN 10-325 MG PO TABS: 1.0000 | ORAL_TABLET | Freq: Three times a day (TID) | ORAL | 0 refills | Status: DC | PRN

## 2016-02-09 ENCOUNTER — Other Ambulatory Visit: Payer: Self-pay | Admitting: Internal Medicine

## 2016-03-06 ENCOUNTER — Other Ambulatory Visit: Payer: Self-pay | Admitting: Internal Medicine

## 2016-03-06 ENCOUNTER — Encounter: Payer: Self-pay | Admitting: Internal Medicine

## 2016-03-06 DIAGNOSIS — M5416 Radiculopathy, lumbar region: Secondary | ICD-10-CM

## 2016-03-06 MED ORDER — HYDROCODONE-ACETAMINOPHEN 10-325 MG PO TABS: 1.0000 | ORAL_TABLET | Freq: Three times a day (TID) | ORAL | 0 refills | Status: DC | PRN

## 2016-03-06 NOTE — Telephone Encounter (Signed)
LVM for pt to call back as soon as possible.   RE: rx ready upfront for pick up.

## 2016-03-07 ENCOUNTER — Encounter: Payer: Self-pay | Admitting: Internal Medicine

## 2016-03-25 ENCOUNTER — Encounter: Payer: Self-pay | Admitting: Internal Medicine

## 2016-03-26 ENCOUNTER — Other Ambulatory Visit: Payer: Self-pay | Admitting: Internal Medicine

## 2016-03-26 DIAGNOSIS — E118 Type 2 diabetes mellitus with unspecified complications: Secondary | ICD-10-CM

## 2016-03-26 DIAGNOSIS — Z794 Long term (current) use of insulin: Principal | ICD-10-CM

## 2016-03-26 MED ORDER — PIOGLITAZONE HCL 15 MG PO TABS: 15.0000 mg | ORAL_TABLET | Freq: Every day | ORAL | 3 refills | Status: DC

## 2016-03-27 DIAGNOSIS — Z794 Long term (current) use of insulin: Secondary | ICD-10-CM | POA: Diagnosis not present

## 2016-03-27 DIAGNOSIS — E119 Type 2 diabetes mellitus without complications: Secondary | ICD-10-CM | POA: Diagnosis not present

## 2016-03-27 DIAGNOSIS — H52223 Regular astigmatism, bilateral: Secondary | ICD-10-CM | POA: Diagnosis not present

## 2016-03-27 DIAGNOSIS — H5203 Hypermetropia, bilateral: Secondary | ICD-10-CM | POA: Diagnosis not present

## 2016-03-27 DIAGNOSIS — Z7984 Long term (current) use of oral hypoglycemic drugs: Secondary | ICD-10-CM | POA: Diagnosis not present

## 2016-03-27 DIAGNOSIS — H25813 Combined forms of age-related cataract, bilateral: Secondary | ICD-10-CM | POA: Diagnosis not present

## 2016-03-27 LAB — HM DIABETES EYE EXAM

## 2016-03-29 ENCOUNTER — Other Ambulatory Visit: Payer: Self-pay | Admitting: Internal Medicine

## 2016-03-29 DIAGNOSIS — K219 Gastro-esophageal reflux disease without esophagitis: Secondary | ICD-10-CM

## 2016-04-02 DIAGNOSIS — N312 Flaccid neuropathic bladder, not elsewhere classified: Secondary | ICD-10-CM | POA: Diagnosis not present

## 2016-04-02 DIAGNOSIS — R350 Frequency of micturition: Secondary | ICD-10-CM | POA: Diagnosis not present

## 2016-04-02 DIAGNOSIS — N3020 Other chronic cystitis without hematuria: Secondary | ICD-10-CM | POA: Diagnosis not present

## 2016-04-03 ENCOUNTER — Encounter: Payer: Self-pay | Admitting: Internal Medicine

## 2016-04-03 NOTE — Progress Notes (Signed)
Exam note faxed and received for pt Diabetic Eye Exam.   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

## 2016-04-09 ENCOUNTER — Other Ambulatory Visit: Payer: Self-pay | Admitting: Internal Medicine

## 2016-04-09 DIAGNOSIS — M5416 Radiculopathy, lumbar region: Secondary | ICD-10-CM

## 2016-04-10 ENCOUNTER — Encounter: Payer: Self-pay | Admitting: Internal Medicine

## 2016-04-10 DIAGNOSIS — Z79891 Long term (current) use of opiate analgesic: Secondary | ICD-10-CM | POA: Diagnosis not present

## 2016-04-10 NOTE — Telephone Encounter (Signed)
Pt came in for rx. UDS with required per PCP. Urine collected.

## 2016-04-13 ENCOUNTER — Other Ambulatory Visit: Payer: Self-pay | Admitting: Internal Medicine

## 2016-04-13 DIAGNOSIS — M5416 Radiculopathy, lumbar region: Secondary | ICD-10-CM

## 2016-04-13 MED ORDER — HYDROCODONE-ACETAMINOPHEN 10-325 MG PO TABS: 1.0000 | ORAL_TABLET | Freq: Three times a day (TID) | ORAL | 0 refills | Status: DC | PRN

## 2016-04-14 ENCOUNTER — Encounter: Payer: Self-pay | Admitting: Internal Medicine

## 2016-04-14 ENCOUNTER — Other Ambulatory Visit: Payer: Self-pay | Admitting: Internal Medicine

## 2016-04-30 ENCOUNTER — Encounter: Payer: Self-pay | Admitting: Internal Medicine

## 2016-04-30 ENCOUNTER — Ambulatory Visit (INDEPENDENT_AMBULATORY_CARE_PROVIDER_SITE_OTHER): Payer: Medicare Other | Admitting: Internal Medicine

## 2016-04-30 ENCOUNTER — Other Ambulatory Visit (INDEPENDENT_AMBULATORY_CARE_PROVIDER_SITE_OTHER): Payer: Medicare Other

## 2016-04-30 VITALS — BP 146/82 | HR 86 | Temp 98.3°F | Resp 16 | Wt 174.0 lb

## 2016-04-30 DIAGNOSIS — D539 Nutritional anemia, unspecified: Secondary | ICD-10-CM | POA: Diagnosis not present

## 2016-04-30 DIAGNOSIS — D72829 Elevated white blood cell count, unspecified: Secondary | ICD-10-CM

## 2016-04-30 DIAGNOSIS — E118 Type 2 diabetes mellitus with unspecified complications: Secondary | ICD-10-CM

## 2016-04-30 DIAGNOSIS — Z23 Encounter for immunization: Secondary | ICD-10-CM

## 2016-04-30 DIAGNOSIS — Z794 Long term (current) use of insulin: Secondary | ICD-10-CM

## 2016-04-30 DIAGNOSIS — I10 Essential (primary) hypertension: Secondary | ICD-10-CM | POA: Diagnosis not present

## 2016-04-30 DIAGNOSIS — M47817 Spondylosis without myelopathy or radiculopathy, lumbosacral region: Secondary | ICD-10-CM

## 2016-04-30 LAB — VITAMIN B12: VITAMIN B 12: 568 pg/mL (ref 211–911)

## 2016-04-30 LAB — CBC WITH DIFFERENTIAL/PLATELET
BASOS ABS: 0.1 10*3/uL (ref 0.0–0.1)
Basophils Relative: 0.5 % (ref 0.0–3.0)
EOS PCT: 1.5 % (ref 0.0–5.0)
Eosinophils Absolute: 0.2 10*3/uL (ref 0.0–0.7)
HCT: 35.4 % — ABNORMAL LOW (ref 36.0–46.0)
Hemoglobin: 12 g/dL (ref 12.0–15.0)
LYMPHS ABS: 4.2 10*3/uL — AB (ref 0.7–4.0)
Lymphocytes Relative: 30.1 % (ref 12.0–46.0)
MCHC: 34 g/dL (ref 30.0–36.0)
MCV: 86 fl (ref 78.0–100.0)
MONO ABS: 1.2 10*3/uL — AB (ref 0.1–1.0)
Monocytes Relative: 8.8 % (ref 3.0–12.0)
NEUTROS ABS: 8.2 10*3/uL — AB (ref 1.4–7.7)
NEUTROS PCT: 59.1 % (ref 43.0–77.0)
Platelets: 470 10*3/uL — ABNORMAL HIGH (ref 150.0–400.0)
RBC: 4.12 Mil/uL (ref 3.87–5.11)
RDW: 13.7 % (ref 11.5–15.5)
WBC: 13.9 10*3/uL — ABNORMAL HIGH (ref 4.0–10.5)

## 2016-04-30 LAB — COMPREHENSIVE METABOLIC PANEL
ALK PHOS: 71 U/L (ref 39–117)
ALT: 14 U/L (ref 0–35)
AST: 12 U/L (ref 0–37)
Albumin: 4.1 g/dL (ref 3.5–5.2)
BUN: 16 mg/dL (ref 6–23)
CHLORIDE: 105 meq/L (ref 96–112)
CO2: 27 meq/L (ref 19–32)
Calcium: 9.5 mg/dL (ref 8.4–10.5)
Creatinine, Ser: 0.81 mg/dL (ref 0.40–1.20)
GFR: 75.21 mL/min (ref >60.00)
GLUCOSE: 127 mg/dL — AB (ref 70–99)
POTASSIUM: 4 meq/L (ref 3.5–5.1)
SODIUM: 142 meq/L (ref 135–145)
TOTAL PROTEIN: 6.6 g/dL (ref 6.0–8.3)
Total Bilirubin: 0.3 mg/dL (ref 0.2–1.2)

## 2016-04-30 LAB — FERRITIN: Ferritin: 34.3 ng/mL (ref 10.0–291.0)

## 2016-04-30 LAB — IBC PANEL
Iron: 104 ug/dL (ref 42–145)
SATURATION RATIOS: 28.4 % (ref 20.0–50.0)
TRANSFERRIN: 262 mg/dL (ref 212.0–360.0)

## 2016-04-30 LAB — HEMOGLOBIN A1C: Hgb A1c MFr Bld: 7.2 % — ABNORMAL HIGH (ref 4.6–6.5)

## 2016-04-30 LAB — RETICULOCYTES
ABS RETIC: 54470 {cells}/uL (ref 20000–80000)
RBC.: 4.19 MIL/uL (ref 3.80–5.10)
Retic Ct Pct: 1.3 %

## 2016-04-30 LAB — FOLATE: Folate: 13.4 ng/mL (ref >5.9)

## 2016-04-30 NOTE — Patient Instructions (Signed)
Anemia, Nonspecific Anemia is a condition in which the concentration of red blood cells or hemoglobin in the blood is below normal. Hemoglobin is a substance in red blood cells that carries oxygen to the tissues of the body. Anemia results in not enough oxygen reaching these tissues.  CAUSES  Common causes of anemia include:   Excessive bleeding. Bleeding may be internal or external. This includes excessive bleeding from periods (in women) or from the intestine.   Poor nutrition.   Chronic kidney, thyroid, and liver disease.  Bone marrow disorders that decrease red blood cell production.  Cancer and treatments for cancer.  HIV, AIDS, and their treatments.  Spleen problems that increase red blood cell destruction.  Blood disorders.  Excess destruction of red blood cells due to infection, medicines, and autoimmune disorders. SIGNS AND SYMPTOMS   Minor weakness.   Dizziness.   Headache.  Palpitations.   Shortness of breath, especially with exercise.   Paleness.  Cold sensitivity.  Indigestion.  Nausea.  Difficulty sleeping.  Difficulty concentrating. Symptoms may occur suddenly or they may develop slowly.  DIAGNOSIS  Additional blood tests are often needed. These help your health care provider determine the best treatment. Your health care provider will check your stool for blood and look for other causes of blood loss.  TREATMENT  Treatment varies depending on the cause of the anemia. Treatment can include:   Supplements of iron, vitamin B12, or folic acid.   Hormone medicines.   A blood transfusion. This may be needed if blood loss is severe.   Hospitalization. This may be needed if there is significant continual blood loss.   Dietary changes.  Spleen removal. HOME CARE INSTRUCTIONS Keep all follow-up appointments. It often takes many weeks to correct anemia, and having your health care provider check on your condition and your response to  treatment is very important. SEEK IMMEDIATE MEDICAL CARE IF:   You develop extreme weakness, shortness of breath, or chest pain.   You become dizzy or have trouble concentrating.  You develop heavy vaginal bleeding.   You develop a rash.   You have bloody or black, tarry stools.   You faint.   You vomit up blood.   You vomit repeatedly.   You have abdominal pain.  You have a fever or persistent symptoms for more than 2-3 days.   You have a fever and your symptoms suddenly get worse.   You are dehydrated.  MAKE SURE YOU:  Understand these instructions.  Will watch your condition.  Will get help right away if you are not doing well or get worse.   This information is not intended to replace advice given to you by your health care provider. Make sure you discuss any questions you have with your health care provider.   Document Released: 07/19/2004 Document Revised: 02/11/2013 Document Reviewed: 12/05/2012 Elsevier Interactive Patient Education 2016 [employer]  

## 2016-04-30 NOTE — Progress Notes (Signed)
Subjective:  Patient ID: Margaret Norman, female    DOB: 1951-06-05  Age: 65 y.o. MRN: [id number]  CC: Diabetes and Anemia   HPI Margaret Norman presents for f/up on DM2 and mild, chronic anemia. Since I last saw her she has had a UDS return that was not consistent with prescribed medications. The screen was negative for hydrocodone and positive for benzos. When I informed her of these results she told me that her husband had given her one of his doses of a benzodiazepine earlier that week. She appears angry about this today and feels like I have accused her of something. I have told her that I think the combination of opiates and benzos is dangerous and had informed her that I would not prescribe hydrocodone anymore. She tells me that her back pain is much improved and she is not taking hydrocodone anymore. 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 She says she may want to take hydrocodone again in the future.  She tells me her blood sugars up and well controlled. I had prescribed at GLP-1 agonist but she was not able to afford it. I subsequently increased the dose of pioglitazone from 30 mg per day to 15m per day but she never took the additional 15 mg dose. She denies visual disturbance, polyuria, polydipsia, or polyphagia.  Outpatient Medications Prior to Visit  Medication Sig Dispense Refill  . aspirin 81 MG EC tablet Take 81 mg by mouth daily.      . B-D ULTRAFINE III SHORT PEN 31G X 8 MM MISC USE DAILY WITH LANTUS INSULIN 1 each 2  . Blood Glucose Monitoring Suppl (ONETOUCH VERIO FLEX SYSTEM) W/DEVICE KIT 1 kit by Does not apply route 3 (three) times daily. 1 kit 1  . buPROPion (WELLBUTRIN XL) 150 MG 24 hr tablet Take 1 tablet (150 mg total) by mouth every morning. 90 tablet 3  . Cyanocobalamin (VITAMIN B12 PO) Take 2,500 mcg by mouth daily.    . cyclobenzaprine (FLEXERIL) 10 MG tablet Take 10 mg by mouth daily as needed. AS NEEDED FOR MUSCLE SPASMS    . docusate sodium  (COLACE) 100 MG capsule Take 100 mg by mouth daily.    . Estradiol (VAGIFEM) 10 MCG TABS vaginal tablet Place 1 tablet vaginally 2 (two) times a week.    . fluticasone (FLONASE) 50 MCG/ACT nasal spray Place 1 spray into both nostrils daily.    .Marland Kitchenglucose blood (ONETOUCH VERIO) test strip Test three times daily as directed 300 each 1  . LANTUS SOLOSTAR 100 UNIT/ML Solostar Pen INJECT 30 UNITS INTO THE SKIN AT BEDTIME 9 mL 3  . metFORMIN (GLUCOPHAGE) 1000 MG tablet Take 1 tablet by mouth two  times daily with meals 180 tablet 2  . mometasone-formoterol (DULERA) 100-5 MCG/ACT AERO Inhale 2 puffs into the lungs 2 (two) times daily. Reported on 12/22/2015    . omeprazole (PRILOSEC) 40 MG capsule TAKE 1 CAPSULE BY MOUTH  DAILY 90 capsule 3  . ONE TOUCH LANCETS MISC Test three times daily as directed 300 each 3  . pioglitazone (ACTOS) 30 MG tablet TAKE 1 TABLET BY MOUTH  DAILY 90 tablet 1  . Probiotic Product (PHILLIPS COLON HEALTH PO) Take 1,000 mg by mouth every evening.    . Pyridoxine HCl (VITAMIN B-6 PO) Take 1 tablet by mouth daily.     . sertraline (ZOLOFT) 50 MG tablet Take 1 tablet (50 mg total) by mouth daily. 90 tablet 3  .  valsartan (DIOVAN) 320 MG tablet Take 1 tablet (320 mg total) by mouth daily. 90 tablet 3  . Cranberry (ELLURA) 200 MG CAPS Take 1 capsule by mouth at bedtime.    . Doxepin HCl (SILENOR) 6 MG TABS Take 6 mg by mouth at bedtime as needed. For sleep    . Multiple Vitamin (MULTIVITAMIN) capsule Take 1 capsule by mouth daily.    . pioglitazone (ACTOS) 15 MG tablet Take 1 tablet (15 mg total) by mouth daily. 90 tablet 3   No facility-administered medications prior to visit.     ROS Review of Systems  Constitutional: Negative for activity change, appetite change, fatigue, fever and unexpected weight change.  HENT: Negative.   Eyes: Negative.  Negative for visual disturbance.  Respiratory: Negative.  Negative for cough, choking, chest tightness, shortness of breath and  stridor.   Cardiovascular: Negative.  Negative for chest pain, palpitations and leg swelling.  Gastrointestinal: Negative.  Negative for abdominal pain, constipation, diarrhea, nausea and vomiting.  Genitourinary: Negative.  Negative for decreased urine volume, difficulty urinating, dysuria, hematuria and urgency.  Musculoskeletal: Positive for arthralgias. Negative for back pain, joint swelling, myalgias and neck pain.  Skin: Negative.  Negative for color change and rash.  Neurological: Negative.  Negative for dizziness, tremors, weakness, numbness and headaches.  Hematological: Negative.  Negative for adenopathy. Does not bruise/bleed easily.  Psychiatric/Behavioral: Negative.  Negative for dysphoric mood, sleep disturbance and suicidal ideas. The patient is not nervous/anxious.     Objective:  BP (!) 146/82   Pulse 86   Temp 98.3 F (36.8 C)   Resp 16   Wt 174 lb (78.9 kg)   SpO2 98%   BMI 30.82 kg/m   BP Readings from Last 3 Encounters:  04/30/16 (!) 146/82  12/29/15 110/60  12/22/15 110/80    Wt Readings from Last 3 Encounters:  04/30/16 174 lb (78.9 kg)  12/29/15 166 lb (75.3 kg)  12/22/15 169 lb 1.9 oz (76.7 kg)    Physical Exam  Constitutional: She is oriented to person, place, and time. No distress.  HENT:  Mouth/Throat: Oropharynx is clear and moist. No oropharyngeal exudate.  Eyes: Conjunctivae are normal. Right eye exhibits no discharge. Left eye exhibits no discharge. No scleral icterus.  Neck: Normal range of motion. Neck supple. No JVD present. No tracheal deviation present. No thyromegaly present.  Cardiovascular: Normal rate, regular rhythm, normal heart sounds and intact distal pulses.  Exam reveals no gallop and no friction rub.   No murmur heard. Pulmonary/Chest: Effort normal and breath sounds normal. No stridor. No respiratory distress. She has no wheezes. She has no rales. She exhibits no tenderness.  Abdominal: Soft. Bowel sounds are normal. She  exhibits no distension and no mass. There is no tenderness. There is no rebound and no guarding.  Musculoskeletal: Normal range of motion. She exhibits no edema, tenderness or deformity.       Right knee: Normal.       Left knee: Normal.       Lumbar back: Normal.  Lymphadenopathy:    She has no cervical adenopathy.  Neurological: She is oriented to person, place, and time.  Skin: Skin is warm and dry. No rash noted. She is not diaphoretic. No erythema. No pallor.  Psychiatric: She has a normal mood and affect. Her behavior is normal. Judgment and thought content normal.  Vitals reviewed.   Lab Results  Component Value Date   WBC 13.9 (H) 04/30/2016   HGB 12.0 04/30/2016  HCT 35.4 (L) 04/30/2016   PLT 470.0 (H) 04/30/2016   GLUCOSE 127 (H) 04/30/2016   CHOL 178 12/29/2015   TRIG 134.0 12/29/2015   HDL 49.60 12/29/2015   LDLDIRECT 137.9 02/04/2013   LDLCALC 102 (H) 12/29/2015   ALT 14 04/30/2016   AST 12 04/30/2016   NA 142 04/30/2016   K 4.0 04/30/2016   CL 105 04/30/2016   CREATININE 0.81 04/30/2016   BUN 16 04/30/2016   CO2 27 04/30/2016   TSH 2.47 04/26/2015   HGBA1C 7.2 (H) 04/30/2016   MICROALBUR 0.8 12/29/2015    Ct Abdomen Pelvis W Contrast  Result Date: 01/21/2014 CLINICAL DATA:  Abdominal pain EXAM: CT ABDOMEN AND PELVIS WITH CONTRAST TECHNIQUE: Multidetector CT imaging of the abdomen and pelvis was performed using the standard protocol following bolus administration of intravenous contrast. Oral contrast was also administered CONTRAST:  123m OMNIPAQUE IOHEXOL 300 MG/ML  SOLN COMPARISON:  January 09, 2010 FINDINGS: Lung bases are clear. Liver is prominent, measuring 17.2 cm in length. No focal liver lesions are appreciable. Gallbladder is absent. There is no appreciable biliary duct dilatation. 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 A portion of the head remains as does the uncinate process. There is  no pancreatic abnormality in the regions of the pancreas which remain. This appearance is stable compared to the prior study. Adrenals appear normal. There is a simple cyst in the medial mid right kidney measuring 1.4 by 1.2 cm. There is a simple cyst in the lateral right mid kidney measuring 1 x 1 cm. There is a mass arising from the lateral mid left kidney measuring 1 x 1 cm which has attenuation values consistent with a cyst. There is a mass arising from the upper pole of the left kidney anteriorly measuring 8 x 7 mm which has attenuation values higher than is expected with a simple cyst. There is a mass arising from the lower pole left kidney measuring 1.4 x 1.1 cm which has attenuation values higher than simple cyst which has increased slightly in size compared to the prior study. There is no hydronephrosis on either side. There is no renal or ureteral calculus on either side. Since the prior study, the patient has undergone ventral hernia repair. There is currently no appreciable ventral hernia. In the pelvis, the urinary bladder is midline with normal wall thickness. There are sigmoid diverticula without diverticulitis. There is no pelvic mass or fluid collection. Appendix appears normal. There is no bowel obstruction. There is no free air or portal venous air. There is moderate stool throughout colon diffusely. There is no ascites, adenopathy, or abscess in the abdomen or pelvis. There is no evidence of abdominal aortic aneurysm. There is degenerative change in the lumbar spine. There are no blastic or lytic bone lesions. IMPRESSION: There is a small mass arising from the lower pole left kidney which has attenuation values slightly higher than simple cyst. This mass has become slightly larger compared to the previous study. Given this circumstance, further evaluation is warranted. Further evaluation with pre and post contrast MRI or CT should be considered. MRI is preferred in younger patients (due to lack  of ionizing radiation) and for evaluating calcified lesion(s). Spleen is absent.  Patient has had a partial pancreatectomy, stable. Liver is prominent but otherwise appears unremarkable. Sigmoid diverticula without diverticulitis. No bowel obstruction. No abscess. Appendix appears normal. The patient has undergone ventral hernia repair; previously noted ventral  hernia is no longer appreciable. There is some scarring in the anterior abdominal wall consistent with previous surgery for this hernia. Electronically Signed   By: Lowella Grip M.D.   On: 01/21/2014 17:44    Assessment & Plan:   [place] was seen today for diabetes and anemia.  Diagnoses and all orders for this visit:  Essential hypertension- Her blood pressure is adequately well controlled, electrolytes and renal function are stable. -     Comprehensive metabolic panel; Future  Type 2 diabetes mellitus with complication, with long-term current use of insulin ([place]) - her A1c is up from 6.6% to 7.2%, so her blood sugars are still relatively well controlled, she agrees to work on her lifestyle modifications, will not add an additional medication at this time. -     Comprehensive metabolic panel; Future -     Hemoglobin A1c; Future  Leukocytosis, unspecified type- her white blood cell count elevation is stable and the rest of her cell lines are within normal limits, this is consistent with postsplenectomy state -     CBC with Differential/Platelet; Future  Lumbosacral spondylosis without myelopathy- I've asked her to be seen by pain management for ongoing evaluation and treatment of her chronic pain. -     Ambulatory referral to Pain Clinic  Deficiency anemia- she is mildly anemic but her vitamin levels are normal, this is most likely the anemia of chronic disease. She has no secondary symptoms or signs of blood loss. I will continue to monitor this. -     CBC with Differential/Platelet; Future -     IBC panel; Future -      Reticulocytes; Future -     Vitamin B12; Future -     Folate; Future -     Ferritin; Future  Encounter for immunization -     Flu vaccine HIGH DOSE PF   I have discontinued Ms. Firman's Doxepin HCl, multivitamin, and Cranberry. I am also having her maintain her aspirin, Cyanocobalamin (VITAMIN B12 PO), Probiotic Product ([place]), Pyridoxine HCl (VITAMIN B-6 PO), docusate sodium, B-D ULTRAFINE III SHORT PEN, LANTUS SOLOSTAR, ONETOUCH VERIO FLEX SYSTEM, glucose blood, ONE TOUCH LANCETS, valsartan, buPROPion, sertraline, Estradiol, mometasone-formoterol, fluticasone, metFORMIN, cyclobenzaprine, omeprazole, and pioglitazone.  No orders of the defined types were placed in this encounter.    Follow-up: Return in about 4 months (around 08/28/2016).  Margaret Calico, MD

## 2016-04-30 NOTE — Progress Notes (Signed)
Pre visit review using our clinic review tool, if applicable. No additional management support is needed unless otherwise documented below in the visit note. 

## 2016-05-01 ENCOUNTER — Encounter: Payer: Self-pay | Admitting: Internal Medicine

## 2016-06-08 ENCOUNTER — Other Ambulatory Visit: Payer: Self-pay | Admitting: Internal Medicine

## 2016-06-08 DIAGNOSIS — E118 Type 2 diabetes mellitus with unspecified complications: Secondary | ICD-10-CM

## 2016-06-08 DIAGNOSIS — I10 Essential (primary) hypertension: Secondary | ICD-10-CM

## 2016-07-09 ENCOUNTER — Other Ambulatory Visit: Payer: Self-pay | Admitting: Internal Medicine

## 2016-07-16 DIAGNOSIS — Z79899 Other long term (current) drug therapy: Secondary | ICD-10-CM | POA: Diagnosis not present

## 2016-07-16 DIAGNOSIS — G894 Chronic pain syndrome: Secondary | ICD-10-CM | POA: Diagnosis not present

## 2016-07-16 DIAGNOSIS — M549 Dorsalgia, unspecified: Secondary | ICD-10-CM | POA: Diagnosis not present

## 2016-07-16 DIAGNOSIS — Z79891 Long term (current) use of opiate analgesic: Secondary | ICD-10-CM | POA: Diagnosis not present

## 2016-07-17 ENCOUNTER — Ambulatory Visit
Admission: RE | Admit: 2016-07-17 | Discharge: 2016-07-17 | Disposition: A | Discharge status: Home / Self Care | Payer: Medicare Other | Source: Ambulatory Visit | Attending: Physician Assistant | Admitting: Physician Assistant

## 2016-07-17 ENCOUNTER — Other Ambulatory Visit: Payer: Self-pay | Admitting: Physician Assistant

## 2016-07-17 DIAGNOSIS — M545 Low back pain, unspecified: Secondary | ICD-10-CM

## 2016-08-02 DIAGNOSIS — Z124 Encounter for screening for malignant neoplasm of cervix: Secondary | ICD-10-CM | POA: Diagnosis not present

## 2016-08-02 DIAGNOSIS — Z1231 Encounter for screening mammogram for malignant neoplasm of breast: Secondary | ICD-10-CM | POA: Diagnosis not present

## 2016-08-02 DIAGNOSIS — Z6831 Body mass index (BMI) 31.0-31.9, adult: Secondary | ICD-10-CM | POA: Diagnosis not present

## 2016-08-02 DIAGNOSIS — Z01419 Encounter for gynecological examination (general) (routine) without abnormal findings: Secondary | ICD-10-CM | POA: Diagnosis not present

## 2016-08-02 DIAGNOSIS — R42 Dizziness and giddiness: Secondary | ICD-10-CM | POA: Diagnosis not present

## 2016-08-13 DIAGNOSIS — Z79899 Other long term (current) drug therapy: Secondary | ICD-10-CM | POA: Diagnosis not present

## 2016-08-13 DIAGNOSIS — Z79891 Long term (current) use of opiate analgesic: Secondary | ICD-10-CM | POA: Diagnosis not present

## 2016-08-13 DIAGNOSIS — G894 Chronic pain syndrome: Secondary | ICD-10-CM | POA: Diagnosis not present

## 2016-08-13 DIAGNOSIS — M549 Dorsalgia, unspecified: Secondary | ICD-10-CM | POA: Diagnosis not present

## 2016-08-28 ENCOUNTER — Ambulatory Visit (INDEPENDENT_AMBULATORY_CARE_PROVIDER_SITE_OTHER): Payer: Medicare Other | Admitting: Internal Medicine

## 2016-08-28 ENCOUNTER — Encounter: Payer: Self-pay | Admitting: Internal Medicine

## 2016-08-28 ENCOUNTER — Other Ambulatory Visit (INDEPENDENT_AMBULATORY_CARE_PROVIDER_SITE_OTHER): Payer: Medicare Other

## 2016-08-28 VITALS — BP 128/78 | HR 81 | Temp 98.4°F | Resp 16 | Ht 63.0 in | Wt 176.0 lb

## 2016-08-28 DIAGNOSIS — Z9081 Acquired absence of spleen: Secondary | ICD-10-CM

## 2016-08-28 DIAGNOSIS — R7989 Other specified abnormal findings of blood chemistry: Secondary | ICD-10-CM

## 2016-08-28 DIAGNOSIS — I10 Essential (primary) hypertension: Secondary | ICD-10-CM | POA: Diagnosis not present

## 2016-08-28 DIAGNOSIS — E118 Type 2 diabetes mellitus with unspecified complications: Secondary | ICD-10-CM

## 2016-08-28 DIAGNOSIS — Z794 Long term (current) use of insulin: Secondary | ICD-10-CM | POA: Diagnosis not present

## 2016-08-28 DIAGNOSIS — D75838 Other thrombocytosis: Secondary | ICD-10-CM

## 2016-08-28 LAB — CBC WITH DIFFERENTIAL/PLATELET
BASOS PCT: 0.8 % (ref 0.0–3.0)
Basophils Absolute: 0.1 10*3/uL (ref 0.0–0.1)
EOS PCT: 2 % (ref 0.0–5.0)
Eosinophils Absolute: 0.3 10*3/uL (ref 0.0–0.7)
HEMATOCRIT: 35 % — AB (ref 36.0–46.0)
HEMOGLOBIN: 11.4 g/dL — AB (ref 12.0–15.0)
LYMPHS PCT: 34.3 % (ref 12.0–46.0)
Lymphs Abs: 5.1 10*3/uL — ABNORMAL HIGH (ref 0.7–4.0)
MCHC: 32.6 g/dL (ref 30.0–36.0)
MCV: 87.8 fl (ref 78.0–100.0)
MONOS PCT: 11.9 % (ref 3.0–12.0)
Monocytes Absolute: 1.8 10*3/uL — ABNORMAL HIGH (ref 0.1–1.0)
NEUTROS ABS: 7.6 10*3/uL (ref 1.4–7.7)
Neutrophils Relative %: 51 % (ref 43.0–77.0)
PLATELETS: 419 10*3/uL — AB (ref 150.0–400.0)
RBC: 3.98 Mil/uL (ref 3.87–5.11)
RDW: 13.7 % (ref 11.5–15.5)
WBC: 14.8 10*3/uL — AB (ref 4.0–10.5)

## 2016-08-28 LAB — BASIC METABOLIC PANEL
BUN: 17 mg/dL (ref 6–23)
CALCIUM: 9.6 mg/dL (ref 8.4–10.5)
CHLORIDE: 105 meq/L (ref 96–112)
CO2: 29 meq/L (ref 19–32)
Creatinine, Ser: 0.84 mg/dL (ref 0.40–1.20)
GFR: 72.04 mL/min (ref >60.00)
GLUCOSE: 147 mg/dL — AB (ref 70–99)
POTASSIUM: 4.5 meq/L (ref 3.5–5.1)
SODIUM: 142 meq/L (ref 135–145)

## 2016-08-28 LAB — HEMOGLOBIN A1C: Hgb A1c MFr Bld: 9.1 % — ABNORMAL HIGH (ref 4.6–6.5)

## 2016-08-28 NOTE — Progress Notes (Signed)
Pre-visit discussion using our clinic review tool. No additional management support is needed unless otherwise documented below in the visit note.  

## 2016-08-28 NOTE — Patient Instructions (Signed)

## 2016-08-28 NOTE — Progress Notes (Signed)
Subjective:  Patient ID: Margaret Norman, female    DOB: [birth year]  Age: 66 y.o. MRN: [id number]  CC: Hypertension (stable) and Diabetes (stable )   HPI [name] presents for Follow-up on hypertension and diabetes. She feels well today and offers no complaints.  Outpatient Medications Prior to Visit  Medication Sig Dispense Refill  . aspirin 81 MG EC tablet Take 81 mg by mouth daily.      . B-D ULTRAFINE III SHORT PEN 31G X 8 MM MISC USE DAILY WITH LANTUS INSULIN 1 each 2  . Blood Glucose Monitoring Suppl (ONETOUCH VERIO FLEX SYSTEM) W/DEVICE KIT 1 kit by Does not apply route 3 (three) times daily. 1 kit 1  . buPROPion (WELLBUTRIN XL) 150 MG 24 hr tablet TAKE 1 TABLET BY MOUTH  EVERY MORNING 90 tablet 2  . Cyanocobalamin (VITAMIN B12 PO) Take 2,500 mcg by mouth daily.    . cyclobenzaprine (FLEXERIL) 10 MG tablet Take 10 mg by mouth daily as needed. AS NEEDED FOR MUSCLE SPASMS    . docusate sodium (COLACE) 100 MG capsule Take 100 mg by mouth daily.    . Estradiol (VAGIFEM) 10 MCG TABS vaginal tablet Place 1 tablet vaginally 2 (two) times a week.    . fluticasone (FLONASE) 50 MCG/ACT nasal spray Place 1 spray into both nostrils daily.    Marland Kitchen glucose blood (ONETOUCH VERIO) test strip Test three times daily as directed 300 each 1  . metFORMIN (GLUCOPHAGE) 1000 MG tablet Take 1 tablet by mouth two  times daily with meals 180 tablet 2  . mometasone-formoterol (DULERA) 100-5 MCG/ACT AERO Inhale 2 puffs into the lungs 2 (two) times daily. Reported on 12/22/2015    . omeprazole (PRILOSEC) 40 MG capsule TAKE 1 CAPSULE BY MOUTH  DAILY 90 capsule 3  . ONE TOUCH LANCETS MISC Test three times daily as directed 300 each 3  . pioglitazone (ACTOS) 30 MG tablet TAKE 1 TABLET BY MOUTH  DAILY 90 tablet 1  . Probiotic Product (PHILLIPS COLON HEALTH PO) Take 1,000 mg by mouth every evening.    . Pyridoxine HCl (VITAMIN B-6 PO) Take 1 tablet by mouth daily.     . sertraline (ZOLOFT) 50 MG tablet TAKE 1  TABLET BY MOUTH  DAILY 90 tablet 2  . valsartan (DIOVAN) 320 MG tablet TAKE 1 TABLET BY MOUTH  DAILY 90 tablet 2  . LANTUS SOLOSTAR 100 UNIT/ML Solostar Pen INJECT 30 UNITS INTO THE SKIN AT BEDTIME 9 mL 3   No facility-administered medications prior to visit.     ROS Review of Systems  Constitutional: Negative.  Negative for activity change, appetite change, diaphoresis, fatigue and unexpected weight change.  HENT: Negative.   Eyes: Negative for visual disturbance.  Respiratory: Negative for cough, chest tightness, shortness of breath and wheezing.   Cardiovascular: Negative for chest pain, palpitations and leg swelling.  Gastrointestinal: Negative for abdominal pain, constipation, diarrhea, nausea and vomiting.  Endocrine: Negative.  Negative for polydipsia, polyphagia and polyuria.  Genitourinary: Negative.  Negative for difficulty urinating, dysuria, flank pain, frequency, pelvic pain and urgency.  Musculoskeletal: Negative.  Negative for back pain, myalgias and neck pain.  Skin: Negative.   Allergic/Immunologic: Negative.   Neurological: Negative.  Negative for dizziness, weakness and numbness.  Hematological: Negative.  Negative for adenopathy. Does not bruise/bleed easily.  Psychiatric/Behavioral: Negative.     Objective:  BP 128/78   Pulse 81   Temp 98.4 F (36.9 C) (Oral)   Resp 16  Ht 5' 3"  (1.6 m)   Wt 176 lb (79.8 kg)   SpO2 96%   BMI 31.18 kg/m   BP Readings from Last 3 Encounters:  08/28/16 128/78  04/30/16 (!) 146/82  12/29/15 110/60    Wt Readings from Last 3 Encounters:  08/28/16 176 lb (79.8 kg)  04/30/16 174 lb (78.9 kg)  12/29/15 166 lb (75.3 kg)    Physical Exam  Constitutional: She is oriented to person, place, and time. No distress.  HENT:  Mouth/Throat: Oropharynx is clear and moist. No oropharyngeal exudate.  Eyes: Conjunctivae are normal. Right eye exhibits no discharge. Left eye exhibits no discharge. No scleral icterus.  Neck: Normal  range of motion. Neck supple. No JVD present. No tracheal deviation present. No thyromegaly present.  Cardiovascular: Normal rate, regular rhythm, normal heart sounds and intact distal pulses.  Exam reveals no gallop and no friction rub.   No murmur heard. Pulmonary/Chest: Effort normal and breath sounds normal. No stridor. No respiratory distress. She has no wheezes. She has no rales. She exhibits no tenderness.  Abdominal: Soft. Bowel sounds are normal. She exhibits no distension and no mass. There is no tenderness. There is no rebound and no guarding.  Musculoskeletal: Normal range of motion. She exhibits no edema, tenderness or deformity.  Lymphadenopathy:    She has no cervical adenopathy.  Neurological: She is oriented to person, place, and time.  Skin: Skin is warm and dry. No rash noted. She is not diaphoretic. No erythema. No pallor.  Psychiatric: She has a normal mood and affect. Her behavior is normal. Judgment and thought content normal.  Vitals reviewed.   Lab Results  Component Value Date   WBC 14.8 (H) 08/28/2016   HGB 11.4 (L) 08/28/2016   HCT 35.0 (L) 08/28/2016   PLT 419.0 (H) 08/28/2016   GLUCOSE 147 (H) 08/28/2016   CHOL 178 12/29/2015   TRIG 134.0 12/29/2015   HDL 49.60 12/29/2015   LDLDIRECT 137.9 02/04/2013   LDLCALC 102 (H) 12/29/2015   ALT 14 04/30/2016   AST 12 04/30/2016   NA 142 08/28/2016   K 4.5 08/28/2016   CL 105 08/28/2016   CREATININE 0.84 08/28/2016   BUN 17 08/28/2016   CO2 29 08/28/2016   TSH 2.47 04/26/2015   HGBA1C 9.1 (H) 08/28/2016   MICROALBUR 0.8 12/29/2015    Dg Lumbar Spine Complete  Result Date: 07/17/2016 CLINICAL DATA:  Low back pain with right lower extremity radicular symptoms. Symptoms for 10 years with recent progression. EXAM: LUMBAR SPINE - COMPLETE 4+ VIEW COMPARISON:  CT of the abdomen and pelvis 01/21/2014. MRI of the lumbar spine 02/06/2013. FINDINGS: Five non rib-bearing lumbar type vertebral bodies are present. Grade  1 anterolisthesis at L4-5 measures 8 mm standing. This is increased from the prior studies. AP alignment is otherwise anatomic. Chronic endplate degenerative changes are again noted L2-3 and L3-4 with loss of disc height at both levels. Chronic facet degenerative changes are most pronounced at L4-5, level of anterolisthesis. Vertebral body heights are preserved. No focal lytic or blastic lesions are present. Cholecystectomy clips are evident. Phleboliths are present within the anatomic pelvis. IMPRESSION: 1. 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 Anterolisthesis is greater than visualized on prior supine imaging. 2. Chronic endplate degenerative changes and loss of disc height at L2-3 and L3-4. Electronically Signed   By: San Morelle M.D.   On: 07/17/2016 09:59    Assessment & Plan:   Illyana  was seen today for hypertension and diabetes.  Diagnoses and all orders for this visit:  Type 2 diabetes mellitus with complication, with long-term current use of insulin ([place])- her A1c is up to 9.1%, it appears that she is not using her basal insulin so Avastin to restart it. We'll continue metformin and Actos. -     Fructosamine; Future -     Basic metabolic panel; Future -     Hemoglobin A1c; Future -     LANTUS SOLOSTAR 100 UNIT/ML Solostar Pen; INJECT 30 UNITS INTO THE SKIN AT BEDTIME  Essential hypertension- her blood pressures adequately well-controlled, electrolytes and renal function are stable. 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 I will continue to follow this. -     CBC with Differential/Platelet; Future   I am having Ms. Minniear maintain her aspirin, Cyanocobalamin (VITAMIN B12 PO), Probiotic Product ([place]), Pyridoxine HCl  (VITAMIN B-6 PO), docusate sodium, B-D ULTRAFINE III SHORT PEN, ONETOUCH VERIO FLEX SYSTEM, glucose blood, ONE TOUCH LANCETS, Estradiol, mometasone-formoterol, fluticasone, metFORMIN, cyclobenzaprine, omeprazole, pioglitazone, valsartan, buPROPion, sertraline, and LANTUS SOLOSTAR.  Meds ordered this encounter  Medications  . LANTUS SOLOSTAR 100 UNIT/ML Solostar Pen    Sig: INJECT 30 UNITS INTO THE SKIN AT BEDTIME    Dispense:  9 mL    Refill:  3     Follow-up: Return in about 4 months (around 12/28/2016).  Scarlette Calico, MD

## 2016-08-30 ENCOUNTER — Encounter: Payer: Self-pay | Admitting: Internal Medicine

## 2016-08-30 LAB — FRUCTOSAMINE: Fructosamine: 322 umol/L — ABNORMAL HIGH (ref 190–270)

## 2016-08-30 MED ORDER — LANTUS SOLOSTAR 100 UNIT/ML ~~LOC~~ SOPN: PEN_INJECTOR | SUBCUTANEOUS | 3 refills | Status: DC

## 2016-09-03 ENCOUNTER — Other Ambulatory Visit: Payer: Self-pay | Admitting: Internal Medicine

## 2016-09-03 DIAGNOSIS — E118 Type 2 diabetes mellitus with unspecified complications: Secondary | ICD-10-CM

## 2016-09-03 DIAGNOSIS — Z794 Long term (current) use of insulin: Principal | ICD-10-CM

## 2016-09-03 MED ORDER — PIOGLITAZONE HCL 30 MG PO TABS: 30.0000 mg | ORAL_TABLET | Freq: Every day | ORAL | 1 refills | Status: DC

## 2016-09-10 ENCOUNTER — Encounter: Payer: Self-pay | Admitting: Internal Medicine

## 2016-09-10 ENCOUNTER — Other Ambulatory Visit: Payer: Self-pay | Admitting: Internal Medicine

## 2016-09-10 DIAGNOSIS — Z79899 Other long term (current) drug therapy: Secondary | ICD-10-CM | POA: Diagnosis not present

## 2016-09-10 DIAGNOSIS — M549 Dorsalgia, unspecified: Secondary | ICD-10-CM | POA: Diagnosis not present

## 2016-09-10 DIAGNOSIS — I10 Essential (primary) hypertension: Secondary | ICD-10-CM

## 2016-09-10 DIAGNOSIS — G894 Chronic pain syndrome: Secondary | ICD-10-CM | POA: Diagnosis not present

## 2016-09-10 DIAGNOSIS — H9313 Tinnitus, bilateral: Secondary | ICD-10-CM | POA: Insufficient documentation

## 2016-09-10 DIAGNOSIS — Z79891 Long term (current) use of opiate analgesic: Secondary | ICD-10-CM | POA: Diagnosis not present

## 2016-09-10 MED ORDER — HYDROCHLOROTHIAZIDE 12.5 MG PO CAPS: 12.5000 mg | ORAL_CAPSULE | Freq: Every day | ORAL | 1 refills | Status: DC

## 2016-09-10 NOTE — Telephone Encounter (Signed)
Pls advise on refill the HCTZ is not on pt med list.../lmb

## 2016-09-25 NOTE — Telephone Encounter (Signed)
Called home number and was informed pt is out for the week on vacation.   Called cell number and pt does not have vm to leave a message.

## 2016-09-25 NOTE — Telephone Encounter (Signed)
FYI: [facility] called to scheduled and pt said "she is not interested & hung up the phone."

## 2016-10-01 ENCOUNTER — Encounter: Payer: Self-pay | Admitting: Internal Medicine

## 2016-10-01 ENCOUNTER — Other Ambulatory Visit: Payer: Self-pay | Admitting: *Deleted

## 2016-10-22 ENCOUNTER — Other Ambulatory Visit: Payer: Self-pay | Admitting: Internal Medicine

## 2016-10-22 ENCOUNTER — Encounter: Payer: Self-pay | Admitting: Internal Medicine

## 2016-10-22 DIAGNOSIS — E118 Type 2 diabetes mellitus with unspecified complications: Secondary | ICD-10-CM

## 2016-10-22 DIAGNOSIS — Z794 Long term (current) use of insulin: Principal | ICD-10-CM

## 2016-10-22 DIAGNOSIS — H9313 Tinnitus, bilateral: Secondary | ICD-10-CM

## 2016-10-22 MED ORDER — INSULIN PEN NEEDLE 31G X 8 MM MISC: 3 refills | Status: DC

## 2016-10-29 ENCOUNTER — Encounter: Payer: Self-pay | Admitting: Internal Medicine

## 2016-10-29 DIAGNOSIS — H9313 Tinnitus, bilateral: Secondary | ICD-10-CM | POA: Diagnosis not present

## 2016-11-05 ENCOUNTER — Other Ambulatory Visit: Payer: Self-pay | Admitting: Pain Medicine

## 2016-11-05 DIAGNOSIS — Z79891 Long term (current) use of opiate analgesic: Secondary | ICD-10-CM | POA: Diagnosis not present

## 2016-11-05 DIAGNOSIS — G894 Chronic pain syndrome: Secondary | ICD-10-CM | POA: Diagnosis not present

## 2016-11-05 DIAGNOSIS — M545 Low back pain: Secondary | ICD-10-CM

## 2016-11-05 DIAGNOSIS — Z79899 Other long term (current) drug therapy: Secondary | ICD-10-CM | POA: Diagnosis not present

## 2016-11-05 DIAGNOSIS — M549 Dorsalgia, unspecified: Secondary | ICD-10-CM | POA: Diagnosis not present

## 2016-11-05 DIAGNOSIS — M47816 Spondylosis without myelopathy or radiculopathy, lumbar region: Secondary | ICD-10-CM | POA: Diagnosis not present

## 2016-11-14 ENCOUNTER — Ambulatory Visit
Admission: RE | Admit: 2016-11-14 | Discharge: 2016-11-14 | Disposition: A | Discharge status: Home / Self Care | Payer: Medicare Other | Source: Ambulatory Visit | Attending: Pain Medicine | Admitting: Pain Medicine

## 2016-11-14 DIAGNOSIS — M545 Low back pain: Secondary | ICD-10-CM

## 2016-11-14 DIAGNOSIS — M47816 Spondylosis without myelopathy or radiculopathy, lumbar region: Secondary | ICD-10-CM | POA: Diagnosis not present

## 2016-12-04 DIAGNOSIS — Z79891 Long term (current) use of opiate analgesic: Secondary | ICD-10-CM | POA: Diagnosis not present

## 2016-12-04 DIAGNOSIS — G894 Chronic pain syndrome: Secondary | ICD-10-CM | POA: Diagnosis not present

## 2016-12-04 DIAGNOSIS — M47816 Spondylosis without myelopathy or radiculopathy, lumbar region: Secondary | ICD-10-CM | POA: Diagnosis not present

## 2016-12-04 DIAGNOSIS — M549 Dorsalgia, unspecified: Secondary | ICD-10-CM | POA: Diagnosis not present

## 2016-12-04 DIAGNOSIS — Z79899 Other long term (current) drug therapy: Secondary | ICD-10-CM | POA: Diagnosis not present

## 2016-12-08 ENCOUNTER — Encounter: Payer: Self-pay | Admitting: Internal Medicine

## 2016-12-10 MED ORDER — GLUCOSE BLOOD VI STRP: ORAL_STRIP | 3 refills | Status: DC

## 2016-12-10 MED ORDER — ONETOUCH LANCETS MISC: 3 refills | Status: DC

## 2016-12-18 MED ORDER — ONETOUCH LANCETS MISC: 3 refills | Status: DC

## 2016-12-18 NOTE — Addendum Note (Signed)
Addended by: Aviva Signs M on: 12/18/2016 10:13 AM   Modules accepted: Orders

## 2016-12-21 NOTE — Telephone Encounter (Signed)
Pt called stating it needs to be sent to [facility]  in [place] has the supplies she needs.  She needs lancets and test strips

## 2016-12-24 MED ORDER — GLUCOSE BLOOD VI STRP: ORAL_STRIP | 3 refills | Status: DC

## 2016-12-24 MED ORDER — ONETOUCH LANCETS MISC: 3 refills | Status: DC

## 2016-12-24 NOTE — Telephone Encounter (Signed)
This has been sent.  [facility], [facility] and [facility] all have her rx's. She will have to have any other rx's transferred.

## 2016-12-24 NOTE — Addendum Note (Signed)
Addended by: Aviva Signs M on: 12/24/2016 09:30 AM   Modules accepted: Orders

## 2016-12-31 ENCOUNTER — Ambulatory Visit (INDEPENDENT_AMBULATORY_CARE_PROVIDER_SITE_OTHER): Payer: Medicare Other | Admitting: Internal Medicine

## 2016-12-31 ENCOUNTER — Other Ambulatory Visit (INDEPENDENT_AMBULATORY_CARE_PROVIDER_SITE_OTHER): Payer: Medicare Other

## 2016-12-31 VITALS — BP 112/80 | HR 79 | Temp 98.0°F | Resp 16 | Ht 63.0 in | Wt 176.0 lb

## 2016-12-31 DIAGNOSIS — E785 Hyperlipidemia, unspecified: Secondary | ICD-10-CM | POA: Diagnosis not present

## 2016-12-31 DIAGNOSIS — Z9081 Acquired absence of spleen: Secondary | ICD-10-CM

## 2016-12-31 DIAGNOSIS — D72829 Elevated white blood cell count, unspecified: Secondary | ICD-10-CM

## 2016-12-31 DIAGNOSIS — D539 Nutritional anemia, unspecified: Secondary | ICD-10-CM | POA: Diagnosis not present

## 2016-12-31 DIAGNOSIS — R7989 Other specified abnormal findings of blood chemistry: Secondary | ICD-10-CM

## 2016-12-31 DIAGNOSIS — E118 Type 2 diabetes mellitus with unspecified complications: Secondary | ICD-10-CM

## 2016-12-31 DIAGNOSIS — I10 Essential (primary) hypertension: Secondary | ICD-10-CM

## 2016-12-31 DIAGNOSIS — D75838 Other thrombocytosis: Secondary | ICD-10-CM

## 2016-12-31 LAB — CBC WITH DIFFERENTIAL/PLATELET
Basophils Absolute: 0.1 K/uL (ref 0.0–0.1)
Basophils Relative: 0.7 % (ref 0.0–3.0)
Eosinophils Absolute: 0.3 K/uL (ref 0.0–0.7)
Eosinophils Relative: 2.4 % (ref 0.0–5.0)
HCT: 33.6 % — ABNORMAL LOW (ref 36.0–46.0)
Hemoglobin: 11.2 g/dL — ABNORMAL LOW (ref 12.0–15.0)
Lymphocytes Relative: 35.3 % (ref 12.0–46.0)
Lymphs Abs: 4 K/uL (ref 0.7–4.0)
MCHC: 33.4 g/dL (ref 30.0–36.0)
MCV: 87.9 fl (ref 78.0–100.0)
Monocytes Absolute: 1.4 K/uL — ABNORMAL HIGH (ref 0.1–1.0)
Monocytes Relative: 12.6 % — ABNORMAL HIGH (ref 3.0–12.0)
Neutro Abs: 5.6 K/uL (ref 1.4–7.7)
Neutrophils Relative %: 49 % (ref 43.0–77.0)
Platelets: 405 K/uL — ABNORMAL HIGH (ref 150.0–400.0)
RBC: 3.82 Mil/uL — ABNORMAL LOW (ref 3.87–5.11)
RDW: 13.5 % (ref 11.5–15.5)
WBC: 11.5 K/uL — ABNORMAL HIGH (ref 4.0–10.5)

## 2016-12-31 LAB — URINALYSIS, ROUTINE W REFLEX MICROSCOPIC
Bilirubin Urine: NEGATIVE
HGB URINE DIPSTICK: NEGATIVE
KETONES UR: NEGATIVE
Leukocytes, UA: NEGATIVE
NITRITE: NEGATIVE
Total Protein, Urine: NEGATIVE
Urine Glucose: NEGATIVE
Urobilinogen, UA: 0.2 (ref 0.0–1.0)
pH: 5.5 (ref 5.0–8.0)

## 2016-12-31 LAB — LIPID PANEL
CHOLESTEROL: 172 mg/dL (ref 0–200)
HDL: 53.4 mg/dL (ref >39.00)
LDL Cholesterol: 99 mg/dL (ref 0–99)
NonHDL: 118.39
TRIGLYCERIDES: 95 mg/dL (ref 0.0–149.0)
Total CHOL/HDL Ratio: 3
VLDL: 19 mg/dL (ref 0.0–40.0)

## 2016-12-31 LAB — COMPREHENSIVE METABOLIC PANEL WITH GFR
ALT: 17 U/L (ref 0–35)
AST: 17 U/L (ref 0–37)
Albumin: 4 g/dL (ref 3.5–5.2)
Alkaline Phosphatase: 64 U/L (ref 39–117)
BUN: 30 mg/dL — ABNORMAL HIGH (ref 6–23)
CO2: 26 meq/L (ref 19–32)
Calcium: 9.6 mg/dL (ref 8.4–10.5)
Chloride: 106 meq/L (ref 96–112)
Creatinine, Ser: 1.07 mg/dL (ref 0.40–1.20)
GFR: 54.43 mL/min — ABNORMAL LOW
Glucose, Bld: 109 mg/dL — ABNORMAL HIGH (ref 70–99)
Potassium: 4.4 meq/L (ref 3.5–5.1)
Sodium: 141 meq/L (ref 135–145)
Total Bilirubin: 0.4 mg/dL (ref 0.2–1.2)
Total Protein: 6.8 g/dL (ref 6.0–8.3)

## 2016-12-31 LAB — HEMOGLOBIN A1C: HEMOGLOBIN A1C: 10.5 % — AB (ref 4.6–6.5)

## 2016-12-31 LAB — MICROALBUMIN / CREATININE URINE RATIO
Creatinine,U: 153.5 mg/dL
Microalb Creat Ratio: 0.8 mg/g (ref 0.0–30.0)
Microalb, Ur: 1.2 mg/dL (ref 0.0–1.9)

## 2016-12-31 NOTE — Progress Notes (Signed)
Pre visit review using our clinic review tool, if applicable. No additional management support is needed unless otherwise documented below in the visit note. 

## 2016-12-31 NOTE — Progress Notes (Signed)
Subjective:  Patient ID: Margaret Norman, female    DOB: Aug 09, 1950  Age: 66 y.o. MRN: [id number]  CC: Anemia and Diabetes   HPI Margaret Norman presents for f/up - she feels well and offers no new complaints. She suffers from chronic, unchanged pain due to osteoarthritis, and DDD in her spine.  Outpatient Medications Prior to Visit  Medication Sig Dispense Refill  . aspirin 81 MG EC tablet Take 81 mg by mouth daily.      . Blood Glucose Monitoring Suppl (ONETOUCH VERIO FLEX SYSTEM) W/DEVICE KIT 1 kit by Does not apply route 3 (three) times daily. 1 kit 1  . buPROPion (WELLBUTRIN XL) 150 MG 24 hr tablet TAKE 1 TABLET BY MOUTH  EVERY MORNING 90 tablet 2  . Cyanocobalamin (VITAMIN B12 PO) Take 2,500 mcg by mouth daily.    . cyclobenzaprine (FLEXERIL) 10 MG tablet Take 10 mg by mouth daily as needed. AS NEEDED FOR MUSCLE SPASMS    . docusate sodium (COLACE) 100 MG capsule Take 100 mg by mouth daily.    . Estradiol (VAGIFEM) 10 MCG TABS vaginal tablet Place 1 tablet vaginally 2 (two) times a week.    . fluticasone (FLONASE) 50 MCG/ACT nasal spray Place 1 spray into both nostrils daily.    Marland Kitchen glucose blood (ONETOUCH VERIO) test strip Test three times daily as directed. DX: E11.9 300 each 3  . hydrochlorothiazide (MICROZIDE) 12.5 MG capsule Take 1 capsule (12.5 mg total) by mouth daily. 90 capsule 1  . Insulin Pen Needle (B-D ULTRAFINE III SHORT PEN) 31G X 8 MM MISC USE DAILY WITH LANTUS INSULIN 100 each 3  . LANTUS SOLOSTAR 100 UNIT/ML Solostar Pen INJECT 30 UNITS INTO THE SKIN AT BEDTIME 9 mL 3  . metFORMIN (GLUCOPHAGE) 1000 MG tablet TAKE 1 TABLET BY MOUTH TWO  TIMES DAILY WITH MEALS 180 tablet 1  . mometasone-formoterol (DULERA) 100-5 MCG/ACT AERO Inhale 2 puffs into the lungs 2 (two) times daily. Reported on 12/22/2015    . omeprazole (PRILOSEC) 40 MG capsule TAKE 1 CAPSULE BY MOUTH  DAILY 90 capsule 3  . ONE TOUCH LANCETS MISC Test three times daily as directed. DX: E11.9 300 each 3  .  pioglitazone (ACTOS) 30 MG tablet Take 1 tablet (30 mg total) by mouth daily. 90 tablet 1  . Pyridoxine HCl (VITAMIN B-6 PO) Take 1 tablet by mouth daily.     . sertraline (ZOLOFT) 50 MG tablet TAKE 1 TABLET BY MOUTH  DAILY 90 tablet 2  . valsartan (DIOVAN) 320 MG tablet TAKE 1 TABLET BY MOUTH  DAILY 90 tablet 2  . Probiotic Product ([facility] COLON HEALTH PO) Take 1,000 mg by mouth every evening.     No facility-administered medications prior to visit.     ROS Review of Systems  Constitutional: Negative.  Negative for appetite change, diaphoresis, fatigue and unexpected weight change.  HENT: Negative.  Negative for sore throat and trouble swallowing.   Eyes: Negative for visual disturbance.  Respiratory: Negative for cough, chest tightness, shortness of breath and wheezing.   Cardiovascular: Negative for chest pain, palpitations and leg swelling.  Gastrointestinal: Negative.  Negative for abdominal pain, anal bleeding, blood in stool, constipation, diarrhea, nausea and vomiting.  Endocrine: Negative for cold intolerance, heat intolerance, polydipsia, polyphagia and polyuria.  Genitourinary: Negative.  Negative for difficulty urinating and dysuria.  Musculoskeletal: Positive for arthralgias, back pain and neck pain. Negative for gait problem, myalgias and neck stiffness.  Skin: Negative.  Negative for  color change and rash.  Allergic/Immunologic: Negative.   Neurological: Negative.  Negative for dizziness, weakness and light-headedness.  Hematological: Negative for adenopathy. Does not bruise/bleed easily.  Psychiatric/Behavioral: Negative.     Objective:  BP 112/80 (BP Location: Left Arm, Patient Position: Sitting, Cuff Size: Normal)   Pulse 79   Temp 98 F (36.7 C) (Oral)   Resp 16   Ht 5' 3"  (1.6 m)   Wt 176 lb (79.8 kg)   SpO2 99%   BMI 31.18 kg/m   BP Readings from Last 3 Encounters:  12/31/16 112/80  08/28/16 128/78  04/30/16 (!) 146/82    Wt Readings from Last 3  Encounters:  12/31/16 176 lb (79.8 kg)  08/28/16 176 lb (79.8 kg)  04/30/16 174 lb (78.9 kg)    Physical Exam  Constitutional: She is oriented to person, place, and time. No distress.  HENT:  Mouth/Throat: Oropharynx is clear and moist. No oropharyngeal exudate.  Eyes: Conjunctivae are normal. Right eye exhibits no discharge. Left eye exhibits no discharge. No scleral icterus.  Neck: Normal range of motion. Neck supple. No JVD present. No thyromegaly present.  Cardiovascular: Normal rate, regular rhythm and intact distal pulses.  Exam reveals no gallop and no friction rub.   No murmur heard. Pulmonary/Chest: Effort normal and breath sounds normal. No respiratory distress. She has no wheezes. She has no rales. She exhibits no tenderness.  Abdominal: Soft. Bowel sounds are normal. She exhibits no distension and no mass. There is no tenderness. There is no rebound and no guarding.  Musculoskeletal: Normal range of motion. She exhibits no edema or tenderness.  Lymphadenopathy:    She has no cervical adenopathy.  Neurological: She is alert and oriented to person, place, and time.  Skin: Skin is warm and dry. No rash noted. She is not diaphoretic. No erythema. No pallor.  Psychiatric: She has a normal mood and affect. Her behavior is normal. Judgment and thought content normal.  Vitals reviewed.   Lab Results  Component Value Date   WBC 11.5 (H) 12/31/2016   HGB 11.2 (L) 12/31/2016   HCT 33.6 (L) 12/31/2016   PLT 405.0 (H) 12/31/2016   GLUCOSE 109 (H) 12/31/2016   CHOL 172 12/31/2016   TRIG 95.0 12/31/2016   HDL 53.40 12/31/2016   LDLDIRECT 137.9 02/04/2013   LDLCALC 99 12/31/2016   ALT 17 12/31/2016   AST 17 12/31/2016   NA 141 12/31/2016   K 4.4 12/31/2016   CL 106 12/31/2016   CREATININE 1.07 12/31/2016   BUN 30 (H) 12/31/2016   CO2 26 12/31/2016   TSH 2.47 04/26/2015   HGBA1C 10.5 (H) 12/31/2016   MICROALBUR 1.2 12/31/2016    Mr Lumbar Spine Wo Contrast  Result Date:  11/14/2016 CLINICAL DATA:  Chronic right low back.  Right thigh numbness. EXAM: MRI LUMBAR SPINE WITHOUT CONTRAST TECHNIQUE: Multiplanar, multisequence MR imaging of the lumbar spine was performed. No intravenous contrast was administered. COMPARISON:  02/06/2013 FINDINGS: Segmentation:  Standard. Alignment:  Physiologic. Vertebrae:  No fracture, evidence of discitis, or bone lesion. Conus medullaris: Extends to the L1 level and appears normal. Paraspinal and other soft tissues: Negative. Multiple stable T2 hyperintense bilateral renal masses most consistent with small cysts. Disc levels: Disc spaces: Degenerative disc disease with disc height loss at L2-3 and L3-4. T11-12: Mild broad-based disc bulge on the sagittal images. No foraminal or central canal stenosis. T12-L1: No significant disc bulge. No evidence of neural foraminal stenosis. No central canal stenosis. L1-L2: No significant  disc bulge. No evidence of neural foraminal stenosis. No central canal stenosis. Mild bilateral facet arthropathy. L2-L3: Mild broad-based disc bulge. Mild bilateral facet arthropathy. Minimal spinal stenosis. Mild right foraminal stenosis. No left foraminal stenosis. L3-L4: Broad-based disc bulge. Moderate right and mild left facet arthropathy. Left lateral recess stenosis. Mild left foraminal stenosis. Mild right foraminal stenosis. No central canal stenosis. L4-L5: Broad-based disc bulge. Severe bilateral facet arthropathy with a prominent bony hypertrophic changes involving the left extraforaminal portion of the facet adjacent to the extraforaminal portion of the left L4 nerve root. No evidence of neural foraminal stenosis. No central canal stenosis. L5-S1: Broad left paracentral/foraminal disc protrusion. No evidence of neural foraminal stenosis. No central canal stenosis. IMPRESSION: 1. Diffuse lumbar spine spondylosis as described above. 2. At L4-5 there is a broad-based Severe bilateral facet arthropathy with a prominent  bony hypertrophic changes involving the left extraforaminal portion of the facet adjacent to the extraforaminal portion of the left L4 nerve root. 3. At L5-S1 there is a broad left paracentral/foraminal disc protrusion. Electronically Signed   By: Kathreen Devoid   On: 11/14/2016 12:57    Assessment & Plan:   Elyna was seen today for anemia and diabetes.  Diagnoses and all orders for this visit:  Type 2 diabetes mellitus with complication, without long-term current use of insulin ([place])- her A1c is up to 10.2%, I've asked her to see endocrinology to consider additional treatment options for hyperglycemia. 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 -     CBC with Differential/Platelet; Future -     Vitamin B1; Future -     Zinc; Future  Leukocytosis, unspecified type- this is stable -     CBC with Differential/Platelet; Future  Essential hypertension- her BP is well controlled, lytes and renal function are normal -     Urinalysis, Routine w reflex microscopic; Future -     Comprehensive metabolic panel; Future  Hyperlipidemia with target LDL less than 100- I have asked her to take a statin for CV risk reduction -     Lipid panel; Future   I have discontinued Ms. Grippi's Probiotic Product ([place]). I am also having her start on atorvastatin. Additionally, I am having her maintain her aspirin, Cyanocobalamin (VITAMIN B12 PO), Pyridoxine HCl (VITAMIN B-6 PO), docusate sodium, ONETOUCH VERIO FLEX SYSTEM, Estradiol, mometasone-formoterol, fluticasone, cyclobenzaprine, omeprazole, valsartan, buPROPion, sertraline, LANTUS SOLOSTAR, pioglitazone,  hydrochlorothiazide, metFORMIN, Insulin Pen Needle, ONE TOUCH LANCETS, glucose blood, and HYDROcodone-acetaminophen.  Meds ordered this encounter  Medications  . HYDROcodone-acetaminophen (NORCO/VICODIN) 5-325 MG tablet    Sig: Take 5-325 tablets by mouth once.  Marland Kitchen atorvastatin (LIPITOR) 10 MG tablet    Sig: Take 1 tablet (10 mg total) by mouth daily.    Dispense:  90 tablet    Refill:  3     Follow-up: Return in about 4 months (around 05/03/2017).  Scarlette Calico, MD

## 2016-12-31 NOTE — Patient Instructions (Signed)

## 2017-01-01 ENCOUNTER — Encounter: Payer: Self-pay | Admitting: Internal Medicine

## 2017-01-02 LAB — FRUCTOSAMINE: Fructosamine: 311 umol/L — ABNORMAL HIGH (ref 190–270)

## 2017-01-02 LAB — ZINC: Zinc: 62 ug/dL (ref 60–130)

## 2017-01-02 MED ORDER — ATORVASTATIN CALCIUM 10 MG PO TABS: 10.0000 mg | ORAL_TABLET | Freq: Every day | ORAL | 3 refills | Status: DC

## 2017-01-04 ENCOUNTER — Other Ambulatory Visit: Payer: Self-pay | Admitting: Internal Medicine

## 2017-01-04 ENCOUNTER — Encounter: Payer: Self-pay | Admitting: Internal Medicine

## 2017-01-04 DIAGNOSIS — E519 Thiamine deficiency, unspecified: Secondary | ICD-10-CM

## 2017-01-04 LAB — VITAMIN B1: Vitamin B1 (Thiamine): 7 nmol/L — ABNORMAL LOW (ref 8–30)

## 2017-01-04 MED ORDER — THIAMINE HCL 100 MG PO TABS: 100.0000 mg | ORAL_TABLET | Freq: Every day | ORAL | 3 refills | Status: DC

## 2017-01-11 ENCOUNTER — Other Ambulatory Visit: Payer: Self-pay

## 2017-01-15 DIAGNOSIS — Z79891 Long term (current) use of opiate analgesic: Secondary | ICD-10-CM | POA: Diagnosis not present

## 2017-01-15 DIAGNOSIS — M47816 Spondylosis without myelopathy or radiculopathy, lumbar region: Secondary | ICD-10-CM | POA: Diagnosis not present

## 2017-01-15 DIAGNOSIS — G894 Chronic pain syndrome: Secondary | ICD-10-CM | POA: Diagnosis not present

## 2017-01-15 DIAGNOSIS — M549 Dorsalgia, unspecified: Secondary | ICD-10-CM | POA: Diagnosis not present

## 2017-01-15 DIAGNOSIS — Z79899 Other long term (current) drug therapy: Secondary | ICD-10-CM | POA: Diagnosis not present

## 2017-01-18 ENCOUNTER — Ambulatory Visit (INDEPENDENT_AMBULATORY_CARE_PROVIDER_SITE_OTHER): Payer: Medicare Other | Admitting: *Deleted

## 2017-01-18 VITALS — BP 128/82 | HR 86 | Resp 20 | Ht 63.0 in | Wt 179.0 lb

## 2017-01-18 DIAGNOSIS — Z0000 Encounter for general adult medical examination without abnormal findings: Secondary | ICD-10-CM

## 2017-01-18 NOTE — Patient Instructions (Signed)
Continue doing brain stimulating activities (puzzles, reading, adult coloring books, staying active) to keep memory sharp.   Continue to eat heart healthy diet (full of fruits, vegetables, whole grains, lean protein, water--limit salt, fat, and sugar intake) and increase physical activity as tolerated.   Margaret Norman , Thank you for taking time to come for your Medicare Wellness Visit. I appreciate your ongoing commitment to your health goals. Please review the following plan we discussed and let me know if I can assist you in the future.   These are the goals we discussed: Goals    . Do the best I can do with my diabetes          Decrease carbohydrates and sugar, exercise more, enjoy life and family.       This is a list of the screening recommended for you and due dates:  Health Maintenance  Topic Date Due  . Flu Shot  01/23/2017  . Eye exam for diabetics  03/27/2017  . Hemoglobin A1C  07/03/2017  . Complete foot exam   08/28/2017  . Mammogram  11/13/2017  . Tetanus Vaccine  10/19/2018  . Colon Cancer Screening  03/19/2024  . DEXA scan (bone density measurement)  Completed  .  Hepatitis C: One time screening is recommended by Center for Disease Control  (CDC) for  adults born from [birth year] through 1966.   Completed  . Pneumonia vaccines  Completed

## 2017-01-18 NOTE — Progress Notes (Signed)
Pre visit review using our clinic review tool, if applicable. No additional management support is needed unless otherwise documented below in the visit note. 

## 2017-01-18 NOTE — Progress Notes (Addendum)
Subjective:   Margaret Norman is a 66 y.o. female who presents for Medicare Annual (Subsequent) preventive examination.  Review of Systems:  No ROS.  Medicare Wellness Visit. Additional risk factors are reflected in the social history.    Sleep patterns: feels rested on waking, gets up 1-2 times nightly to void and sleeps 6-7 hours nightly.    Home Safety/Smoke Alarms: Feels safe in home. Smoke alarms in place.  Living environment; residence and Firearm Safety: 2-story house, no firearms. Lives with husband, no needs for DME, good support system  Seat Belt Safety/Bike Helmet: Wears seat belt.   Counseling:   Eye Exam- appointment yearly Dental- appointment every 6 months   Female:   Pap- Last, 01/24/15      Mammo- Last, 11/12/13, BI-RADS category 1: negative, Patient signed release form to obtain up to date records from Dr. Julien Girt     Dexa scan- N/D,  Patient signed release form to obtain up to date records from Dr. Julien Girt    CCS- Last 03/19/14, recall 10 years      Objective:     Vitals: There were no vitals taken for this visit.  There is no height or weight on file to calculate BMI.   Tobacco History  Smoking Status  . Never Smoker  Smokeless Tobacco  . Never Used     Counseling given: Not Answered   Past Medical History:  Diagnosis Date  . Arthritis   . Chest pain    Nuclear, adenosine,  December, 2013, low risk nuclear scan with small, moderate in intensity, fixed anteroseptal defect. This is possibly related to an LBBB versus small prior infarct. No ischemia  . Depression   . Gallstones 11-06  . GERD (gastroesophageal reflux disease)   . HTN (hypertension)   . Hyperlipidemia   . Kidney mass 01/28/2014  . LBBB (left bundle branch block)   . Nephrolithiasis   . Pneumonia   . 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 Type II or unspecified type diabetes mellitus without mention of complication, not stated as uncontrolled    Past Surgical History:    Procedure Laterality Date  . CESAREAN SECTION     x2 ? w/appy  . CHOLECYSTECTOMY    . ESOPHAGOGASTRODUODENOSCOPY    . HERNIA REPAIR    . KNEE ARTHROSCOPY Right 11/2005  . LIVER BIOPSY    . PANCREATIC CYST EXCISION    . SPLENECTOMY    . TONSILLECTOMY AND ADENOIDECTOMY     Family History  Problem Relation Age of Onset  . Liver disease Mother   . Dementia Mother   . Diabetes Mother        borderline  . Coronary artery disease Father   . Heart attack Father   . Hypertension Father   . Heart disease Father   . Cancer Other        leukemia  . Stroke Maternal Grandfather   . Hyperlipidemia Brother   . Diabetes Maternal Grandmother   . Cancer Paternal Uncle        unknown  . Heart attack Paternal Grandmother   . Heart attack Paternal Uncle   . Hypertension Brother   . Diabetes Daughter        borderline   History  Sexual Activity  . Sexual activity: Not Currently    Outpatient Encounter Prescriptions as of 01/18/2017  Medication Sig  . aspirin 81 MG EC tablet Take 81 mg by mouth daily.    Marland Kitchen atorvastatin (LIPITOR) 10 MG  tablet Take 1 tablet (10 mg total) by mouth daily.  . Blood Glucose Monitoring Suppl (ONETOUCH VERIO FLEX SYSTEM) W/DEVICE KIT 1 kit by Does not apply route 3 (three) times daily.  Marland Kitchen buPROPion (WELLBUTRIN XL) 150 MG 24 hr tablet TAKE 1 TABLET BY MOUTH  EVERY MORNING  . Cyanocobalamin (VITAMIN B12 PO) Take 2,500 mcg by mouth daily.  . cyclobenzaprine (FLEXERIL) 10 MG tablet Take 10 mg by mouth daily as needed. AS NEEDED FOR MUSCLE SPASMS  . docusate sodium (COLACE) 100 MG capsule Take 100 mg by mouth daily.  . Estradiol (VAGIFEM) 10 MCG TABS vaginal tablet Place 1 tablet vaginally 2 (two) times a week.  . fluticasone (FLONASE) 50 MCG/ACT nasal spray Place 1 spray into both nostrils daily.  Marland Kitchen glucose blood (ONETOUCH VERIO) test strip Test three times daily as directed. DX: E11.9  . hydrochlorothiazide (MICROZIDE) 12.5 MG capsule Take 1 capsule (12.5 mg total)  by mouth daily.  Marland Kitchen HYDROcodone-acetaminophen (NORCO/VICODIN) 5-325 MG tablet Take 5-325 tablets by mouth once.  . Insulin Pen Needle (B-D ULTRAFINE III SHORT PEN) 31G X 8 MM MISC USE DAILY WITH LANTUS INSULIN  . LANTUS SOLOSTAR 100 UNIT/ML Solostar Pen INJECT 30 UNITS INTO THE SKIN AT BEDTIME  . metFORMIN (GLUCOPHAGE) 1000 MG tablet TAKE 1 TABLET BY MOUTH TWO  TIMES DAILY WITH MEALS  . mometasone-formoterol (DULERA) 100-5 MCG/ACT AERO Inhale 2 puffs into the lungs 2 (two) times daily. Reported on 12/22/2015  . omeprazole (PRILOSEC) 40 MG capsule TAKE 1 CAPSULE BY MOUTH  DAILY  . ONE TOUCH LANCETS MISC Test three times daily as directed. DX: E11.9  . pioglitazone (ACTOS) 30 MG tablet Take 1 tablet (30 mg total) by mouth daily.  . Pyridoxine HCl (VITAMIN B-6 PO) Take 1 tablet by mouth daily.   . sertraline (ZOLOFT) 50 MG tablet TAKE 1 TABLET BY MOUTH  DAILY  . thiamine 100 MG tablet Take 1 tablet (100 mg total) by mouth daily.  . valsartan (DIOVAN) 320 MG tablet TAKE 1 TABLET BY MOUTH  DAILY   No facility-administered encounter medications on file as of 01/18/2017.     Activities of Daily Living No flowsheet data found.  Patient Care Team: Janith Lima, MD as PCP - General    Assessment:    Physical assessment deferred to PCP.  Exercise Activities and Dietary recommendations   Diet (meal preparation, eat out, water intake, caffeinated beverages, dairy products, fruits and vegetables): in general, an "unhealthy" diet, on average, 1 meals per day  Reviewed heart healthy and diabetic diet, encouraged patient to increase daily water intake. Discussed not skipping meals and instead eat small frequent meals, Diet education was provided via handout.  Goals    None     Fall Risk Fall Risk  08/25/2015 08/25/2013  Falls in the past year? No No   Depression Screen PHQ 2/9 Scores 08/25/2015 04/27/2015 08/25/2013  PHQ - 2 Score 1 2 0     Cognitive Function       Ad8 score reviewed for  issues:  Issues making decisions: no  Less interest in hobbies / activities: no  Repeats questions, stories (family complaining): no  Trouble using ordinary gadgets (microwave, computer, phone):no  Forgets the month or year: no  Mismanaging finances: no  Remembering appts: no  Daily problems with thinking and/or memory: no Ad8 score is= 0  Immunization History  Administered Date(s) Administered  . Influenza Split 04/09/2011, 06/12/2012  . Influenza Whole 04/05/2009  . Influenza, High Dose  Seasonal PF 04/30/2016  . Influenza,inj,Quad PF,36+ Mos 04/09/2013, 05/14/2014, 04/26/2015  . Meningococcal Polysaccharide 08/31/2008  . Pneumococcal Conjugate-13 09/13/2014  . Pneumococcal Polysaccharide-23 08/31/2008, 12/29/2015  . Td 03/26/1999, 10/18/2008  . Zoster 12/23/2014   Screening Tests Health Maintenance  Topic Date Due  . INFLUENZA VACCINE  01/23/2017  . OPHTHALMOLOGY EXAM  03/27/2017  . HEMOGLOBIN A1C  07/03/2017  . FOOT EXAM  08/28/2017  . MAMMOGRAM  11/13/2017  . TETANUS/TDAP  10/19/2018  . COLONOSCOPY  03/19/2024  . DEXA SCAN  Completed  . Hepatitis C Screening  Completed  . PNA vac Low Risk Adult  Completed      Plan:    Continue doing brain stimulating activities (puzzles, reading, adult coloring books, staying active) to keep memory sharp.   Continue to eat heart healthy diet (full of fruits, vegetables, whole grains, lean protein, water--limit salt, fat, and sugar intake) and increase physical activity as tolerated.  I have personally reviewed and noted the following in the patient's chart:   . Medical and social history . Use of alcohol, tobacco or illicit drugs  . Current medications and supplements . Functional ability and status . Nutritional status . Physical activity . Advanced directives . List of other physicians . Vitals . Screenings to include cognitive, depression, and falls . Referrals and appointments  In addition, I have reviewed  and discussed with patient certain preventive protocols, quality metrics, and best practice recommendations. A written personalized care plan for preventive services as well as general preventive health recommendations were provided to patient.     Michiel Cowboy, RN  01/18/2017   Medical screening examination/treatment/procedure(s) were performed by non-physician practitioner and as supervising provider I was immediately available for consultation/collaboration. I agree with treatment plan and recommend continued follow up with PCP for uncontrolled diabetes. Mauricio Po, FNP

## 2017-02-06 ENCOUNTER — Other Ambulatory Visit: Payer: Self-pay | Admitting: Internal Medicine

## 2017-02-06 DIAGNOSIS — E118 Type 2 diabetes mellitus with unspecified complications: Secondary | ICD-10-CM

## 2017-02-06 DIAGNOSIS — Z794 Long term (current) use of insulin: Principal | ICD-10-CM

## 2017-02-07 ENCOUNTER — Ambulatory Visit (INDEPENDENT_AMBULATORY_CARE_PROVIDER_SITE_OTHER): Payer: Medicare Other | Admitting: Cardiovascular Disease

## 2017-02-07 ENCOUNTER — Encounter: Payer: Self-pay | Admitting: Cardiovascular Disease

## 2017-02-07 VITALS — BP 130/82 | HR 80 | Ht 63.0 in | Wt 181.0 lb

## 2017-02-07 DIAGNOSIS — I10 Essential (primary) hypertension: Secondary | ICD-10-CM | POA: Diagnosis not present

## 2017-02-07 DIAGNOSIS — I447 Left bundle-branch block, unspecified: Secondary | ICD-10-CM | POA: Diagnosis not present

## 2017-02-07 MED ORDER — OLMESARTAN MEDOXOMIL 40 MG PO TABS
40.0000 mg | ORAL_TABLET | Freq: Every day | ORAL | 3 refills | Status: DC
Start: 2017-02-07 — End: 2017-03-19

## 2017-02-07 NOTE — Progress Notes (Signed)
Primary cardiologist - [name], previous Margaret Norman patient   Problem List  1. Chest pain  2. LBBB 3. Diabetes Mellitus  4. Essential Hypertension 5. Hyperlipidemia   HPI   Margaret Norman is seen today for initial visit .  Previous patient of Dr. Ron Norman. Seen with husband , Margaret Norman  Has been having some ankle and leg swelling . BP had been higher.   Had been having lots of anxiety . Had stopped taking the Invokana  Was started on HCTZ 12.5 a day BP is better,  Has had some dizziness with that. Leg edema has resolved.   Has + family hx of heart disease Brother has HTN and LVH Father had an aortic issue.   Has been fatigued.  Has occasional PVC No CP .   Has some discomfort across her back  Does not get any regular exercise,   Gets short of breath with household chores.   12/22/2015: Margaret Norman is seen back today for follow-up visit. She was having some chest discomfort when we initially saw her several months ago.  She was on depleted at her initial visit. We stopped her HCTZ at that time. Stress Myoview study revealed no evidence of ischemia. Ejection fraction was 48% by nuclear study. By echocardiography, her EF was 50-55%. She does have grade 1 diastolic dysfunction.  Still having some issues with chest pain .  Worse with mental stress.   Aug. 16, 2018:    [name] is seen today  BP has been well control She has heard about the valsartan recall.     Allergies  Allergen Reactions  . Invokana [Canagliflozin] Other (See Comments)    UTI's  . Benicar [Olmesartan]     GI upset  . Amoxicillin     Sore tongue    Current Outpatient Prescriptions  Medication Sig Dispense Refill  . aspirin 81 MG EC tablet Take 81 mg by mouth daily.      . Blood Glucose Monitoring Suppl (ONETOUCH VERIO FLEX SYSTEM) W/DEVICE KIT 1 kit by Does not apply route 3 (three) times daily. 1 kit 1  . buPROPion (WELLBUTRIN XL) 150 MG 24 hr tablet TAKE 1 TABLET BY MOUTH  EVERY MORNING 90 tablet 2  .  Cyanocobalamin (VITAMIN B12 PO) Take 2,500 mcg by mouth daily.    Marland Kitchen docusate sodium (COLACE) 100 MG capsule Take 100 mg by mouth daily.    Marland Kitchen glucose blood (ONETOUCH VERIO) test strip Test three times daily as directed. DX: E11.9 300 each 3  . hydrochlorothiazide (MICROZIDE) 12.5 MG capsule Take 1 capsule (12.5 mg total) by mouth daily. 90 capsule 1  . HYDROcodone-acetaminophen (NORCO/VICODIN) 5-325 MG tablet Take 5-325 tablets by mouth once.    . Insulin Pen Needle (B-D ULTRAFINE III SHORT PEN) 31G X 8 MM MISC USE DAILY WITH LANTUS INSULIN 100 each 3  . LANTUS SOLOSTAR 100 UNIT/ML Solostar Pen INJECT 30 UNITS INTO THE SKIN AT BEDTIME 9 mL 3  . metFORMIN (GLUCOPHAGE) 1000 MG tablet TAKE 1 TABLET BY MOUTH TWO  TIMES DAILY WITH MEALS 180 tablet 1  . omeprazole (PRILOSEC) 40 MG capsule TAKE 1 CAPSULE BY MOUTH  DAILY 90 capsule 3  . ONE TOUCH LANCETS MISC Test three times daily as directed. DX: E11.9 300 each 3  . pioglitazone (ACTOS) 30 MG tablet TAKE 1 TABLET BY MOUTH  DAILY 90 tablet 1  . Pyridoxine HCl (VITAMIN B-6 PO) Take 1 tablet by mouth daily.     . sertraline (ZOLOFT) 50  MG tablet TAKE 1 TABLET BY MOUTH  DAILY 90 tablet 2  . thiamine 100 MG tablet Take 1 tablet (100 mg total) by mouth daily. 90 tablet 3  . valsartan (DIOVAN) 320 MG tablet TAKE 1 TABLET BY MOUTH  DAILY 90 tablet 2   No current facility-administered medications for this visit.     Social History   Social History  . Marital status: Married    Spouse name: Margaret Norman  . Number of children: 2  . 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 Smoking status: Never Smoker  . Smokeless tobacco: Never Used  . Alcohol use No  . Drug use: No  . Sexual activity: Not Currently   Other Topics Concern  . Not on file   Social History Narrative   Regular Exercise -  NO    Family History  Problem Relation Age of Onset  . Liver disease Mother     . Dementia Mother   . Diabetes Mother        borderline  . Coronary artery disease Father   . Heart attack Father   . Hypertension Father   . Heart disease Father   . Cancer Other        leukemia  . Stroke Maternal Grandfather   . Hyperlipidemia Brother   . Diabetes Maternal Grandmother   . Cancer Paternal Uncle        unknown  . Heart attack Paternal Grandmother   . Heart attack Paternal Uncle   . Hypertension Brother   . Diabetes Daughter        borderline    Past Medical History:  Diagnosis Date  . Arthritis   . Chest pain    Nuclear, adenosine,  December, 2013, low risk nuclear scan with small, moderate in intensity, fixed anteroseptal defect. This is possibly related to an LBBB versus small prior infarct. No ischemia  . Depression   . Gallstones 11-06  . GERD (gastroesophageal reflux disease)   . HTN (hypertension)   . Hyperlipidemia   . Kidney mass 01/28/2014  . LBBB (left bundle branch block)   . Nephrolithiasis   . Pneumonia   . 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 Type II or unspecified type diabetes mellitus without mention of complication, not stated as uncontrolled     Past Surgical History:  Procedure Laterality Date  . CESAREAN SECTION     x2 ? w/appy  . CHOLECYSTECTOMY    . ESOPHAGOGASTRODUODENOSCOPY    . HERNIA REPAIR    . KNEE ARTHROSCOPY Right 11/2005  . LIVER BIOPSY    . PANCREATIC CYST EXCISION    . SPLENECTOMY    . TONSILLECTOMY AND ADENOIDECTOMY      Patient Active Problem List   Diagnosis Date Noted  . LBBB (left bundle branch block)     Priority: High  . Thiamine deficiency 01/04/2017  . 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 Deficiency anemia 04/30/2016  . Routine general medical examination at a health care facility 04/27/2015  . Abdominal wall hernia 04/26/2015  . Family history of hemochromatosis 09/13/2014  . Personal history of colonic polyps 03/10/2014  . 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  Lumbosacral spondylosis without myelopathy 05/12/2013  . Insomnia, persistent 02/04/2013  . Obesity (BMI 30-39.9) 02/04/2013  . Unspecified asthma, with exacerbation 09/22/2012  . Visit for screening mammogram 07/04/2012  . Nephrolithiasis   . HTN (  hypertension)   . Gallstones   . Leukocytosis 12/20/2008  . Type II diabetes mellitus with manifestations ([place]) 09/04/2007  . Hyperlipidemia with target LDL less than 100 09/04/2007  . DEPRESSION 09/04/2007  . GERD 09/04/2007    ROS   Patient denies fever, chills, headache, sweats, rash, change in vision, change in hearing, chest pain, cough, nausea vomiting, urinary symptoms. All other systems are reviewed and are negative.  PHYSICAL EXAM   Vitals:   02/07/17 0911  BP: 130/82  Pulse: 80  SpO2: 97%  Weight: 181 lb (82.1 kg)  Height: 5' 3"  (1.6 m)   Physical Exam: Blood pressure 130/82, pulse 80, height 5' 3"  (1.6 m), weight 181 lb (82.1 kg), SpO2 97 %. General: Well developed, well nourished, in no acute distress. Head: Normocephalic, atraumatic, sclera non-icteric, mucus membranes are moist Neck: Supple. Negative for carotid bruits. JVD not elevated. Lungs: Clear bilaterally to auscultation without wheezes, rales, or rhonchi. Breathing is unlabored. Heart: RRR with S1 S2. No murmurs, rubs, or gallops appreciated. Abdomen: Soft, non-tender, non-distended with normoactive bowel sounds. No hepatomegaly. No rebound/guarding. No obvious abdominal masses. Msk:  Strength and tone appear normal for age. Extremities: No clubbing or cyanosis. No edema.  Distal pedal pulses are 2+ and equal bilaterally. Neuro: Alert and oriented X 3. Moves all extremities spontaneously. Psych:  Responds to questions appropriately with a normal affect.   ECG :    Feb 07, 2017:   NSR at 67.  LBBB  ASSESSMENT & PLAN  1. Chest pain  - has resolved.  2. LBBB 3. Diabetes Mellitus  4. Essential Hypertension - BP is well controlled. Will DC Valsartan (  has been recalled )  Start Olmasartan 40 mg a day ,  Check BMP in 3 weeks   5. Hyperlipidemia  Will see see her in 1 year   Mertie Moores, MD  02/07/2017 9:26 AM    [facility] [address],  [address] [place], [place]  [address] Pager [phone] Phone: [phone]; Fax: [phone]

## 2017-02-07 NOTE — Patient Instructions (Addendum)
Medication Instructions:   Your physician has recommended you make the following change in your medication:   Stop valsartan.  Start Benicar (Olmesartan) 40 mg by mouth daily.  Continue all other medications the same.  Labwork:  Your physician recommends that you return for lab work in: 3 weeks to check your BMET.  Testing/Procedures:  NONE  Follow-Up:  Your physician recommends that you schedule a follow-up appointment in: 1 year. You will receive a reminder letter in the mail in about 10 months reminding you to call and schedule your appointment. If you don't receive this letter, please contact our office.  Any Other Special Instructions Will Be Listed Below (If Applicable).  If you need a refill on your cardiac medications before your next appointment, please call your pharmacy.

## 2017-02-11 DIAGNOSIS — M47817 Spondylosis without myelopathy or radiculopathy, lumbosacral region: Secondary | ICD-10-CM | POA: Diagnosis not present

## 2017-02-12 DIAGNOSIS — Z79891 Long term (current) use of opiate analgesic: Secondary | ICD-10-CM | POA: Diagnosis not present

## 2017-02-12 DIAGNOSIS — G894 Chronic pain syndrome: Secondary | ICD-10-CM | POA: Diagnosis not present

## 2017-02-12 DIAGNOSIS — Z79899 Other long term (current) drug therapy: Secondary | ICD-10-CM | POA: Diagnosis not present

## 2017-02-12 DIAGNOSIS — M47816 Spondylosis without myelopathy or radiculopathy, lumbar region: Secondary | ICD-10-CM | POA: Diagnosis not present

## 2017-02-12 DIAGNOSIS — M549 Dorsalgia, unspecified: Secondary | ICD-10-CM | POA: Diagnosis not present

## 2017-02-17 DIAGNOSIS — H10019 Acute follicular conjunctivitis, unspecified eye: Secondary | ICD-10-CM | POA: Diagnosis not present

## 2017-02-28 ENCOUNTER — Other Ambulatory Visit: Payer: Medicare Other | Admitting: *Deleted

## 2017-02-28 DIAGNOSIS — E118 Type 2 diabetes mellitus with unspecified complications: Secondary | ICD-10-CM | POA: Diagnosis not present

## 2017-02-28 DIAGNOSIS — E785 Hyperlipidemia, unspecified: Secondary | ICD-10-CM | POA: Diagnosis not present

## 2017-02-28 DIAGNOSIS — I10 Essential (primary) hypertension: Secondary | ICD-10-CM

## 2017-02-28 LAB — BASIC METABOLIC PANEL
BUN / CREAT RATIO: 29 — AB (ref 12–28)
BUN: 24 mg/dL (ref 8–27)
CHLORIDE: 101 mmol/L (ref 96–106)
CO2: 24 mmol/L (ref 20–29)
CREATININE: 0.84 mg/dL (ref 0.57–1.00)
Calcium: 10.1 mg/dL (ref 8.7–10.3)
GFR calc Af Amer: 84 mL/min/{1.73_m2} (ref >59)
GFR calc non Af Amer: 73 mL/min/{1.73_m2} (ref >59)
GLUCOSE: 144 mg/dL — AB (ref 65–99)
POTASSIUM: 4.9 mmol/L (ref 3.5–5.2)
SODIUM: 141 mmol/L (ref 134–144)

## 2017-02-28 NOTE — Addendum Note (Signed)
Addended by: Eulis Foster on: 02/28/2017 09:09 AM   Modules accepted: Orders

## 2017-03-12 ENCOUNTER — Ambulatory Visit (INDEPENDENT_AMBULATORY_CARE_PROVIDER_SITE_OTHER): Payer: Medicare Other | Admitting: General Practice

## 2017-03-12 DIAGNOSIS — Z23 Encounter for immunization: Secondary | ICD-10-CM

## 2017-03-15 ENCOUNTER — Telehealth: Payer: Self-pay

## 2017-03-15 NOTE — Telephone Encounter (Signed)
Patient is requesting letter to excuse her from [employer]. PCP okayed to write letter.   Letter has been completed.   Left detailed message that form is ready to be picked up.

## 2017-03-19 ENCOUNTER — Ambulatory Visit (INDEPENDENT_AMBULATORY_CARE_PROVIDER_SITE_OTHER): Payer: Medicare Other | Admitting: Internal Medicine

## 2017-03-19 ENCOUNTER — Encounter: Payer: Self-pay | Admitting: Internal Medicine

## 2017-03-19 ENCOUNTER — Other Ambulatory Visit: Payer: Self-pay | Admitting: Cardiovascular Disease

## 2017-03-19 ENCOUNTER — Other Ambulatory Visit: Payer: Self-pay | Admitting: Internal Medicine

## 2017-03-19 ENCOUNTER — Other Ambulatory Visit: Payer: Self-pay

## 2017-03-19 VITALS — BP 112/62 | HR 89 | Ht 63.0 in | Wt 176.0 lb

## 2017-03-19 DIAGNOSIS — E119 Type 2 diabetes mellitus without complications: Secondary | ICD-10-CM | POA: Insufficient documentation

## 2017-03-19 DIAGNOSIS — E1165 Type 2 diabetes mellitus with hyperglycemia: Secondary | ICD-10-CM | POA: Diagnosis not present

## 2017-03-19 DIAGNOSIS — Z794 Long term (current) use of insulin: Secondary | ICD-10-CM

## 2017-03-19 DIAGNOSIS — K219 Gastro-esophageal reflux disease without esophagitis: Secondary | ICD-10-CM

## 2017-03-19 MED ORDER — DULAGLUTIDE 0.75 MG/0.5ML ~~LOC~~ SOAJ: SUBCUTANEOUS | 1 refills | Status: DC

## 2017-03-19 MED ORDER — OLMESARTAN MEDOXOMIL 40 MG PO TABS: 40.0000 mg | ORAL_TABLET | Freq: Every day | ORAL | 3 refills | Status: DC

## 2017-03-19 MED ORDER — LIRAGLUTIDE 18 MG/3ML ~~LOC~~ SOPN: PEN_INJECTOR | SUBCUTANEOUS | 3 refills | Status: DC

## 2017-03-19 NOTE — Patient Instructions (Addendum)
Please continue: - Metformin 1000 mg 2x a day with meals - Actos 30 mg daily - Lantus 30 mg at night  Try to start: - Victoza 0.6 mg daily before b'fast x 5 days, then 1.2 mg x 5 days, then 1.8 mg daily.  Please let me know if Victoza is not covered.  Alternatives for Victoza and Trulicity: - Bydureon  - Victoza (once a day) - Byetta (2x a day)  Or orals meds: - Tradjenta - Januvia - Nesina - Onglyza - Alogliptin  Please return in 1.5 months with your sugar log.   Please stop at the lab.  PATIENT INSTRUCTIONS FOR TYPE 2 DIABETES:  **Please join MyChart!** - see attached instructions about how to join if you have not done so already.  DIET AND EXERCISE Diet and exercise is an important part of diabetic treatment.  We recommended aerobic exercise in the form of brisk walking (working between 40-60% of maximal aerobic capacity, similar to brisk walking) for 150 minutes per week (such as 30 minutes five days per week) along with 3 times per week performing 'resistance' training (using various gauge rubber tubes with handles) 5-10 exercises involving the major muscle groups (upper body, lower body and core) performing 10-15 repetitions (or near fatigue) each exercise. Start at half the above goal but build slowly to reach the above goals. If limited by weight, joint pain, or disability, we recommend daily walking in a swimming pool with water up to waist to reduce pressure from joints while allow for adequate exercise.    BLOOD GLUCOSES Monitoring your blood glucoses is important for continued management of your diabetes. Please check your blood glucoses 2-4 times a day: fasting, before meals and at bedtime (you can rotate these measurements - e.g. one day check before the 3 meals, the next day check before 2 of the meals and before bedtime, etc.).   HYPOGLYCEMIA (low blood sugar) Hypoglycemia is usually a reaction to not eating, exercising, or taking too much insulin/ other diabetes  drugs.  Symptoms include tremors, sweating, hunger, confusion, headache, etc. Treat IMMEDIATELY with 15 grams of Carbs: . 4 glucose tablets .  cup regular juice/soda . 2 tablespoons raisins . 4 teaspoons sugar . 1 tablespoon honey Recheck blood glucose in 15 mins and repeat above if still symptomatic/blood glucose <100.  RECOMMENDATIONS TO REDUCE YOUR RISK OF DIABETIC COMPLICATIONS: * Take your prescribed MEDICATION(S) * Follow a DIABETIC diet: Complex carbs, fiber rich foods, (monounsaturated and polyunsaturated) fats * AVOID saturated/trans fats, high fat foods, >2,300 mg salt per day. * EXERCISE at least 5 times a week for 30 minutes or preferably daily.  * DO NOT SMOKE OR DRINK more than 1 drink a day. * Check your FEET every day. Do not wear tightfitting shoes. Contact us if you develop an ulcer * See your EYE doctor once a year or more if needed * Get a FLU shot once a year * Get a PNEUMONIA vaccine once before and once after age 66 years  GOALS:  * Your Hemoglobin A1c of <7%  * fasting sugars need to be <130 * after meals sugars need to be <180 (2h after you start eating) * Your Systolic BP should be 270 or lower  * Your Diastolic BP should be 80 or lower  * Your HDL (Good Cholesterol) should be 40 or higher  * Your LDL (Bad Cholesterol) should be 100 or lower. * Your Triglycerides should be 150 or lower  * Your Urine microalbumin (kidney  function) should be <30 * Your Body Mass Index should be 25 or lower    Please consider the following ways to cut down carbs and fat and increase fiber and micronutrients in your diet: - substitute whole grain for white bread or pasta - substitute brown rice for white rice - substitute 90-calorie flat bread pieces for slices of bread when possible - substitute sweet potatoes or yams for white potatoes - substitute humus for margarine - substitute tofu for cheese when possible - substitute almond or rice milk for regular milk (would  not drink soy milk daily due to concern for soy estrogen influence on breast cancer risk) - substitute dark chocolate for other sweets when possible - substitute water - can add lemon or orange slices for taste - for diet sodas (artificial sweeteners will trick your body that you can eat sweets without getting calories and will lead you to overeating and weight gain in the long run) - do not skip breakfast or other meals (this will slow down the metabolism and will result in more weight gain over time)  - can try smoothies made from fruit and almond/rice milk in am instead of regular breakfast - can also try old-fashioned (not instant) oatmeal made with almond/rice milk in am - order the dressing on the side when eating salad at a restaurant (pour less than half of the dressing on the salad) - eat as little meat as possible - can try juicing, but should not forget that juicing will get rid of the fiber, so would alternate with eating raw veg./fruits or drinking smoothies - use as little oil as possible, even when using olive oil - can dress a salad with a mix of balsamic vinegar and lemon juice, for e.g. - use agave nectar, stevia sugar, or regular sugar rather than artificial sweateners - steam or broil/roast veggies  - snack on veggies/fruit/nuts (unsalted, preferably) when possible, rather than processed foods - reduce or eliminate aspartame in diet (it is in diet sodas, chewing gum, etc) Read the labels!  Try to read Dr. Janene Harvey book: "Program for Reversing Diabetes" for other ideas for healthy eating.

## 2017-03-19 NOTE — Progress Notes (Signed)
Patient ID: Margaret Norman, female   DOB: Jan 29, 1951, 66 y.o.   MRN: [id number]   HPI: Margaret Norman is a 66 y.o.-year-old female, referred by her PCP, Dr. Ronnald Ramp, for management of DM2, dx in 2008, insulin-dependent since 2016, uncontrolled, without complications.  Last hemoglobin A1c was: 12/31/2016: HbA1c calc. From fructosamine: 6.9%! Lab Results  Component Value Date   HGBA1C 10.5 (H) 12/31/2016   HGBA1C 9.1 (H) 08/28/2016   HGBA1C 7.2 (H) 04/30/2016   Pt is on a regimen of: - Metformin 1000 mg 2x a day, with meals - Actos 30 mg daily at night - Lantus 30 units at bedtime - forgets seldom She was on Trulicity but became expensive >> changed to Actos. She was on Invokana >> recurrent UTIs.  Pt checks her sugars 1-2x a day and they are: - am: 72-149, except in vacation: 160-264 - 2h after b'fast: n/c - before lunch: n/c - 2h after lunch: n/c - before dinner: n/c - 2h after dinner: n/c - bedtime: n/c - nighttime: n/c No lows. Lowest sugar was 72; she has hypoglycemia awareness at 70.  Highest sugar was 200s.  Glucometer: OneTouch Verio  Pt's meals are: - Breakfast: usually skip, Sun: scrambled eggs, bacon, bisquit and gravy; raisin brain cereal; bran muffins - Lunch: skips or salad - Dinner: frozen meal - Snacks: regular Dr. Malachi Bonds   - no CKD, last BUN/creatinine:  Lab Results  Component Value Date   BUN 24 02/28/2017   BUN 30 (H) 12/31/2016   CREATININE 0.84 02/28/2017   CREATININE 1.07 12/31/2016  On Valsartan. - last set of lipids: Lab Results  Component Value Date   CHOL 172 12/31/2016   HDL 53.40 12/31/2016   LDLCALC 99 12/31/2016   LDLDIRECT 137.9 02/04/2013   TRIG 95.0 12/31/2016   CHOLHDL 3 12/31/2016   - last eye exam was in 06/2016. No DR. + cataracts. Dr. Marica Otter. - NO numbness and tingling in her feet. +RLS. On ASA 81.  Pt has FH of DM in MGM.  ROS: Constitutional: + weight gain, + fatigue, no hot flushes, + poor sleep Eyes: +  blurry vision, no xerophthalmia ENT: no sore throat, no nodules palpated in throat, no dysphagia/odynophagia, + hoarseness, + tinnitus Cardiovascular: no CP/+ SOB/no palpitations/+ leg swelling Respiratory: + cough/+ SOB Gastrointestinal: + N/no V/D/+ C, + heartburn Musculoskeletal: + muscle aches/+ joint aches Skin: no rashes, + hair loss Neurological: no tremors/numbness/tingling/dizziness, + HA Psychiatric: + depression/no anxiety + low libido  Past Medical History:  Diagnosis Date  . Arthritis   . Chest pain    Nuclear, adenosine,  December, 2013, low risk nuclear scan with small, moderate in intensity, fixed anteroseptal defect. This is possibly related to an LBBB versus small prior infarct. No ischemia  . Depression   . Gallstones 11-06  . GERD (gastroesophageal reflux disease)   . HTN (hypertension)   . Hyperlipidemia   . Kidney mass 01/28/2014  . LBBB (left bundle branch block)   . Nephrolithiasis   . Pneumonia   . 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 Type II or unspecified type diabetes mellitus without mention of complication, not stated as uncontrolled    Past Surgical History:  Procedure Laterality Date  . CESAREAN SECTION     x2 ? w/appy  . CHOLECYSTECTOMY    . ESOPHAGOGASTRODUODENOSCOPY    . HERNIA REPAIR    . KNEE ARTHROSCOPY Right 11/2005  . LIVER BIOPSY    . PANCREATIC CYST EXCISION    .  SPLENECTOMY    . TONSILLECTOMY AND ADENOIDECTOMY     Social History   Social History  . Marital status: Married    Spouse name: [name]  . Number of children: 2  . 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 Smoking status: Never Smoker  . Smokeless tobacco: Never Used  . Alcohol use No  . Drug use: No  . Sexual activity: Not Currently   Other Topics Concern  . Not on file   Social History Narrative   Regular Exercise -  NO   Current Outpatient Prescriptions on File  Prior to Visit  Medication Sig Dispense Refill  . aspirin 81 MG EC tablet Take 81 mg by mouth daily.      . Blood Glucose Monitoring Suppl (ONETOUCH VERIO FLEX SYSTEM) W/DEVICE KIT 1 kit by Does not apply route 3 (three) times daily. 1 kit 1  . buPROPion (WELLBUTRIN XL) 150 MG 24 hr tablet TAKE 1 TABLET BY MOUTH  EVERY MORNING 90 tablet 2  . Cyanocobalamin (VITAMIN B12 PO) Take 2,500 mcg by mouth daily.    Marland Kitchen docusate sodium (COLACE) 100 MG capsule Take 100 mg by mouth daily.    Marland Kitchen glucose blood (ONETOUCH VERIO) test strip Test three times daily as directed. DX: E11.9 300 each 3  . hydrochlorothiazide (MICROZIDE) 12.5 MG capsule Take 1 capsule (12.5 mg total) by mouth daily. 90 capsule 1  . HYDROcodone-acetaminophen (NORCO/VICODIN) 5-325 MG tablet Take 5-325 tablets by mouth every 4 (four) hours as needed.     . Insulin Pen Needle (B-D ULTRAFINE III SHORT PEN) 31G X 8 MM MISC USE DAILY WITH LANTUS INSULIN 100 each 3  . LANTUS SOLOSTAR 100 UNIT/ML Solostar Pen INJECT 30 UNITS INTO THE SKIN AT BEDTIME 9 mL 3  . metFORMIN (GLUCOPHAGE) 1000 MG tablet TAKE 1 TABLET BY MOUTH TWO  TIMES DAILY WITH MEALS 180 tablet 1  . olmesartan (BENICAR) 40 MG tablet Take 1 tablet (40 mg total) by mouth daily. 90 tablet 3  . omeprazole (PRILOSEC) 40 MG capsule TAKE 1 CAPSULE BY MOUTH  DAILY 90 capsule 3  . ONE TOUCH LANCETS MISC Test three times daily as directed. DX: E11.9 300 each 3  . pioglitazone (ACTOS) 30 MG tablet TAKE 1 TABLET BY MOUTH  DAILY 90 tablet 1  . Pyridoxine HCl (VITAMIN B-6 PO) Take 1 tablet by mouth daily.     . sertraline (ZOLOFT) 50 MG tablet TAKE 1 TABLET BY MOUTH  DAILY 90 tablet 2  . thiamine 100 MG tablet Take 1 tablet (100 mg total) by mouth daily. 90 tablet 3   No current facility-administered medications on file prior to visit.    Allergies  Allergen Reactions  . Invokana [Canagliflozin] Other (See Comments)    UTI's  . Benicar [Olmesartan]     GI upset  . Amoxicillin     Sore  tongue   Family History  Problem Relation Age of Onset  . Liver disease Mother   . Dementia Mother   . Diabetes Mother        borderline  . Coronary artery disease Father   . Heart attack Father   . Hypertension Father   . Heart disease Father   . Cancer Other        leukemia  . Stroke Maternal Grandfather   . Hyperlipidemia Brother   . Diabetes Maternal Grandmother   .  Cancer Paternal Uncle        unknown  . Heart attack Paternal Grandmother   . Heart attack Paternal Uncle   . Hypertension Brother   . Diabetes Daughter        borderline    PE: BP 112/62 (BP Location: Left Arm, Patient Position: Sitting)   Pulse 89   Ht 5' 3"  (1.6 m)   Wt 176 lb (79.8 kg)   SpO2 97%   BMI 31.18 kg/m  Wt Readings from Last 3 Encounters:  03/19/17 176 lb (79.8 kg)  02/07/17 181 lb (82.1 kg)  01/18/17 179 lb (81.2 kg)   Constitutional: overweight, in NAD Eyes: PERRLA, EOMI, no exophthalmos ENT: moist mucous membranes, L prominent thyroid, no cervical lymphadenopathy Cardiovascular: RRR, No MRG Respiratory: CTA B Gastrointestinal: abdomen soft, NT, ND, BS+ Musculoskeletal: no deformities, strength intact in all 4 Skin: moist, warm, no rashes Neurological: no tremor with outstretched hands, DTR normal in all 4  ASSESSMENT: 1. DM2, insulin-dependent, uncontrolled, without long term complications, but with HGly  PLAN:  1. Patient with long-standing, uncontrolled diabetes, on oral antidiabetic regimen + basal insulin, which became insufficient. She was using Trulicity in the past with great control, but this became expensive >> changed to Actos. She did notice some leg swelling and also weight gain after she started this. - at this visit, we reviewed her meter download >> she only check sin am >> sugars better after she decreased intake of Dr Malachi Bonds >> 70s-140s in am. Before last 10 days, while in vacation, she was forgetting Lantus and eating out >> sugars 160-260s in am. - I advised  her to start checking sugars later in the day also - will try to add Victoza, but if not covered, we can try a DPP4 inh or Glipizide - discusse dthe need to stay off soft drinks and reduce the fat in her diet. Also, try to eat B'fast and lunch. - reviewed her most recent HbA1c and this is in vast discrepance with her most recent fructosamine (see HPI) >> will chec a new fructosamine today - I suggested to:  Patient Instructions  Please continue: - Metformin 1000 mg 2x a day with meals - Actos 30 mg daily - Lantus 30 units at night  Try to start: - Victoza 0.6 mg daily before b'fast x 5 days, then 1.2 mg x 5 days, then 1.8 mg daily.  Please let me know if Victoza is not covered.  Alternatives for Victoza and Trulicity: - Bydureon  - Victoza (once a day) - Byetta (2x a day)  Or orals meds: - Tradjenta - Januvia - Nesina - Onglyza - Alogliptin  Please return in 1.5 months with your sugar log.   Please stop at the lab.  - Strongly advised her to start checking sugars at different times of the day - check 1-2 times a day, rotating checks - given sugar log and advised how to fill it and to bring it at next appt  - given foot care handout and explained the principles  - given instructions for hypoglycemia management "15-15 rule"  - advised for yearly eye exams  - Return to clinic in 1.5 mo with sugar log   Office Visit on 03/19/2017  Component Date Value Ref Range Status  . Fructosamine 03/19/2017 300* 190 - 270 umol/L Final   HbA1c calculated from fructosamine is 6.7%.  Philemon Kingdom, MD PhD [facility]

## 2017-03-20 ENCOUNTER — Encounter: Payer: Self-pay | Admitting: Internal Medicine

## 2017-03-20 ENCOUNTER — Other Ambulatory Visit: Payer: Self-pay

## 2017-03-20 MED ORDER — DULAGLUTIDE 0.75 MG/0.5ML ~~LOC~~ SOAJ: SUBCUTANEOUS | 1 refills | Status: DC

## 2017-03-21 LAB — FRUCTOSAMINE: Fructosamine: 300 umol/L — ABNORMAL HIGH (ref 190–270)

## 2017-03-28 DIAGNOSIS — M25569 Pain in unspecified knee: Secondary | ICD-10-CM | POA: Diagnosis not present

## 2017-03-28 DIAGNOSIS — Z79891 Long term (current) use of opiate analgesic: Secondary | ICD-10-CM | POA: Diagnosis not present

## 2017-03-28 DIAGNOSIS — Z79899 Other long term (current) drug therapy: Secondary | ICD-10-CM | POA: Diagnosis not present

## 2017-03-28 DIAGNOSIS — M47816 Spondylosis without myelopathy or radiculopathy, lumbar region: Secondary | ICD-10-CM | POA: Diagnosis not present

## 2017-03-28 DIAGNOSIS — M549 Dorsalgia, unspecified: Secondary | ICD-10-CM | POA: Diagnosis not present

## 2017-03-28 DIAGNOSIS — G894 Chronic pain syndrome: Secondary | ICD-10-CM | POA: Diagnosis not present

## 2017-04-03 DIAGNOSIS — N3020 Other chronic cystitis without hematuria: Secondary | ICD-10-CM | POA: Diagnosis not present

## 2017-04-03 DIAGNOSIS — N281 Cyst of kidney, acquired: Secondary | ICD-10-CM | POA: Diagnosis not present

## 2017-04-03 DIAGNOSIS — N312 Flaccid neuropathic bladder, not elsewhere classified: Secondary | ICD-10-CM | POA: Diagnosis not present

## 2017-04-10 ENCOUNTER — Telehealth: Payer: Self-pay | Admitting: Internal Medicine

## 2017-04-10 NOTE — Telephone Encounter (Signed)
[facility]  Patient Name: Margaret Norman  DOB: [birth year]    Initial Comment Caller states has a tick bite, center is dark and red around it   Nurse Assessment  Nurse: Renie Ora, RN, Ashby Dawes Date/Time ([place] Time): 04/10/2017 9:08:45 AM  Confirm and document reason for call. If symptomatic, describe symptoms. ---Caller states has a tick bite, center is dark and red around it. Removed the tick completely. Bite is on her left side of her abdomen. Happened about 2 am this morning. Spot is about the size of her pinky nail.  Does the patient have any new or worsening symptoms? ---Yes  Will a triage be completed? ---Yes  Related visit to physician within the last 2 weeks? ---No  Does the PT have any chronic conditions? (i.e. diabetes, asthma, etc.) ---Yes  List chronic conditions. ---Diabetes type 2  Is this a behavioral health or substance abuse call? ---No     Guidelines    Guideline Title Affirmed Question Affirmed Notes  Tick Bite Tick bite with no complications    Final Disposition User   [place], RN, Ashby Dawes    Caller Disagree/Comply Comply  Caller Understands Yes  PreDisposition Home Care

## 2017-04-15 ENCOUNTER — Telehealth: Payer: Self-pay | Admitting: Internal Medicine

## 2017-04-15 ENCOUNTER — Encounter: Payer: Self-pay | Admitting: Internal Medicine

## 2017-04-15 ENCOUNTER — Other Ambulatory Visit: Payer: Self-pay

## 2017-04-15 MED ORDER — DULAGLUTIDE 1.5 MG/0.5ML ~~LOC~~ SOAJ: SUBCUTANEOUS | 2 refills | Status: DC

## 2017-04-15 NOTE — Telephone Encounter (Signed)
Rec'd from [facility] forwarded 39 pages to Dr. Scarlette Calico

## 2017-04-26 DIAGNOSIS — Y999 Unspecified external cause status: Secondary | ICD-10-CM | POA: Diagnosis not present

## 2017-04-26 DIAGNOSIS — E119 Type 2 diabetes mellitus without complications: Secondary | ICD-10-CM | POA: Diagnosis not present

## 2017-04-26 DIAGNOSIS — I10 Essential (primary) hypertension: Secondary | ICD-10-CM | POA: Diagnosis not present

## 2017-04-26 DIAGNOSIS — S161XXA Strain of muscle, fascia and tendon at neck level, initial encounter: Secondary | ICD-10-CM | POA: Diagnosis not present

## 2017-04-26 DIAGNOSIS — Z888 Allergy status to other drugs, medicaments and biological substances status: Secondary | ICD-10-CM | POA: Diagnosis not present

## 2017-04-26 DIAGNOSIS — I447 Left bundle-branch block, unspecified: Secondary | ICD-10-CM | POA: Diagnosis not present

## 2017-04-26 DIAGNOSIS — E785 Hyperlipidemia, unspecified: Secondary | ICD-10-CM | POA: Diagnosis not present

## 2017-04-26 DIAGNOSIS — Y92410 Unspecified street and highway as the place of occurrence of the external cause: Secondary | ICD-10-CM | POA: Diagnosis not present

## 2017-04-26 DIAGNOSIS — Z880 Allergy status to penicillin: Secondary | ICD-10-CM | POA: Diagnosis not present

## 2017-04-28 DIAGNOSIS — I447 Left bundle-branch block, unspecified: Secondary | ICD-10-CM | POA: Diagnosis not present

## 2017-05-02 ENCOUNTER — Encounter: Payer: Self-pay | Admitting: Internal Medicine

## 2017-05-02 ENCOUNTER — Other Ambulatory Visit (INDEPENDENT_AMBULATORY_CARE_PROVIDER_SITE_OTHER): Payer: Medicare Other

## 2017-05-02 ENCOUNTER — Ambulatory Visit (INDEPENDENT_AMBULATORY_CARE_PROVIDER_SITE_OTHER): Payer: Medicare Other | Admitting: Internal Medicine

## 2017-05-02 ENCOUNTER — Telehealth: Payer: Self-pay | Admitting: Emergency Medicine

## 2017-05-02 VITALS — BP 142/82 | HR 94 | Temp 98.4°F | Resp 16 | Ht 63.0 in | Wt 177.0 lb

## 2017-05-02 DIAGNOSIS — E1165 Type 2 diabetes mellitus with hyperglycemia: Secondary | ICD-10-CM

## 2017-05-02 DIAGNOSIS — E519 Thiamine deficiency, unspecified: Secondary | ICD-10-CM

## 2017-05-02 DIAGNOSIS — I10 Essential (primary) hypertension: Secondary | ICD-10-CM | POA: Diagnosis not present

## 2017-05-02 DIAGNOSIS — D539 Nutritional anemia, unspecified: Secondary | ICD-10-CM | POA: Diagnosis not present

## 2017-05-02 DIAGNOSIS — J0100 Acute maxillary sinusitis, unspecified: Secondary | ICD-10-CM

## 2017-05-02 DIAGNOSIS — Z794 Long term (current) use of insulin: Secondary | ICD-10-CM | POA: Diagnosis not present

## 2017-05-02 LAB — CBC WITH DIFFERENTIAL/PLATELET
Basophils Absolute: 0.2 10*3/uL — ABNORMAL HIGH (ref 0.0–0.1)
Basophils Relative: 1.2 % (ref 0.0–3.0)
EOS PCT: 1.8 % (ref 0.0–5.0)
Eosinophils Absolute: 0.3 10*3/uL (ref 0.0–0.7)
HCT: 37.2 % (ref 36.0–46.0)
Hemoglobin: 11.8 g/dL — ABNORMAL LOW (ref 12.0–15.0)
LYMPHS ABS: 7.1 10*3/uL — AB (ref 0.7–4.0)
Lymphocytes Relative: 36.7 % (ref 12.0–46.0)
MCHC: 31.7 g/dL (ref 30.0–36.0)
MCV: 89.1 fl (ref 78.0–100.0)
MONO ABS: 1.8 10*3/uL — AB (ref 0.1–1.0)
MONOS PCT: 9.3 % (ref 3.0–12.0)
NEUTROS ABS: 9.8 10*3/uL — AB (ref 1.4–7.7)
NEUTROS PCT: 51 % (ref 43.0–77.0)
PLATELETS: 483 10*3/uL — AB (ref 150.0–400.0)
RBC: 4.17 Mil/uL (ref 3.87–5.11)
RDW: 13.8 % (ref 11.5–15.5)
WBC: 19.3 10*3/uL (ref 4.0–10.5)

## 2017-05-02 LAB — VITAMIN B12: Vitamin B-12: 715 pg/mL (ref 211–911)

## 2017-05-02 LAB — FERRITIN: FERRITIN: 52.6 ng/mL (ref 10.0–291.0)

## 2017-05-02 LAB — HEMOGLOBIN A1C: HEMOGLOBIN A1C: 7.5 % — AB (ref 4.6–6.5)

## 2017-05-02 LAB — IBC PANEL
Iron: 64 ug/dL (ref 42–145)
SATURATION RATIOS: 18.1 % — AB (ref 20.0–50.0)
TRANSFERRIN: 253 mg/dL (ref 212.0–360.0)

## 2017-05-02 LAB — FOLATE: FOLATE: 9.7 ng/mL (ref >5.9)

## 2017-05-02 MED ORDER — CEFDINIR 300 MG PO CAPS: 300.0000 mg | ORAL_CAPSULE | Freq: Two times a day (BID) | ORAL | 0 refills | Status: AC

## 2017-05-02 NOTE — Telephone Encounter (Signed)
CRITICAL VALUE STICKER  CRITICAL VALUE: WBC 19.3   DATE & TIME NOTIFIED: 05/02/2017 12:38  MESSENGER (representative from lab): Santiago Glad

## 2017-05-02 NOTE — Progress Notes (Signed)
Subjective:  Patient ID: Margaret Norman, female    DOB: October 19, 1950  Age: 66 y.o. MRN: [id number]  CC: Sinusitis   HPI Margaret Norman presents for a 2-week history of runny nose that produces thick green bloody phlegm, facial pain, and fatigue.  She denies cough, shortness of breath, fever, or chills.  Outpatient Medications Prior to Visit  Medication Sig Dispense Refill  . aspirin 81 MG EC tablet Take 81 mg by mouth daily.      . Blood Glucose Monitoring Suppl (ONETOUCH VERIO FLEX SYSTEM) W/DEVICE KIT 1 kit by Does not apply route 3 (three) times daily. 1 kit 1  . buPROPion (WELLBUTRIN XL) 150 MG 24 hr tablet TAKE 1 TABLET BY MOUTH  EVERY MORNING 90 tablet 1  . Cyanocobalamin (VITAMIN B12 PO) Take 2,500 mcg by mouth daily.    Marland Kitchen docusate sodium (COLACE) 100 MG capsule Take 100 mg by mouth daily.    . Dulaglutide (TRULICITY) 1.5 PJ/0.3PR SOPN Inject 1.5 mg weekly 4 pen 2  . glucose blood (ONETOUCH VERIO) test strip Test three times daily as directed. DX: E11.9 300 each 3  . hydrochlorothiazide (MICROZIDE) 12.5 MG capsule Take 1 capsule (12.5 mg total) by mouth daily. 90 capsule 1  . HYDROcodone-acetaminophen (NORCO/VICODIN) 5-325 MG tablet Take 5-325 tablets by mouth every 4 (four) hours as needed.     . Insulin Pen Needle (B-D ULTRAFINE III SHORT PEN) 31G X 8 MM MISC USE DAILY WITH LANTUS INSULIN 100 each 3  . LANTUS SOLOSTAR 100 UNIT/ML Solostar Pen INJECT 30 UNITS INTO THE SKIN AT BEDTIME 9 mL 3  . metFORMIN (GLUCOPHAGE) 1000 MG tablet TAKE 1 TABLET BY MOUTH TWO  TIMES DAILY WITH MEALS 180 tablet 1  . olmesartan (BENICAR) 40 MG tablet Take 1 tablet (40 mg total) by mouth daily. 90 tablet 3  . omeprazole (PRILOSEC) 40 MG capsule TAKE 1 CAPSULE BY MOUTH  DAILY 90 capsule 1  . ONE TOUCH LANCETS MISC Test three times daily as directed. DX: E11.9 300 each 3  . Pyridoxine HCl (VITAMIN B-6 PO) Take 1 tablet by mouth daily.     . sertraline (ZOLOFT) 50 MG tablet TAKE 1 TABLET BY MOUTH   DAILY 90 tablet 1  . thiamine 100 MG tablet Take 1 tablet (100 mg total) by mouth daily. 90 tablet 3  . pioglitazone (ACTOS) 30 MG tablet TAKE 1 TABLET BY MOUTH  DAILY 90 tablet 1   No facility-administered medications prior to visit.     ROS Review of Systems  Constitutional: Positive for fatigue. Negative for appetite change, chills, diaphoresis, fever and unexpected weight change.  HENT: Positive for postnasal drip, rhinorrhea, sinus pressure and sinus pain. Negative for facial swelling, sneezing, sore throat and tinnitus.   Eyes: Negative for visual disturbance.  Respiratory: Negative for cough, chest tightness, shortness of breath and wheezing.   Cardiovascular: Negative.  Negative for chest pain, palpitations and leg swelling.  Gastrointestinal: Negative for abdominal pain, constipation, diarrhea, nausea and vomiting.  Endocrine: Negative.   Genitourinary: Negative.  Negative for difficulty urinating.  Musculoskeletal: Negative.  Negative for myalgias.  Skin: Negative.  Negative for color change and wound.  Allergic/Immunologic: Negative.   Neurological: Negative.  Negative for dizziness, weakness and headaches.  Hematological: Negative for adenopathy. Does not bruise/bleed easily.  Psychiatric/Behavioral: Negative.     Objective:  BP (!) 142/82 (BP Location: Left Arm, Patient Position: Sitting, Cuff Size: Normal)   Pulse 94   Temp 98.4 F (36.9  C) (Oral)   Resp 16   Ht _0  (1.6 m)   Wt 177 lb (80.3 kg)   SpO2 95%   BMI 31.35 kg/m   BP Readings from Last 3 Encounters:  05/02/17 (!) 142/82  03/19/17 112/62  02/07/17 130/82    Wt Readings from Last 3 Encounters:  05/02/17 177 lb (80.3 kg)  03/19/17 176 lb (79.8 kg)  02/07/17 181 lb (82.1 kg)    Physical Exam  Constitutional: She is oriented to person, place, and time.  Non-toxic appearance. She does not have a sickly appearance. She does not appear ill. No distress.  HENT:  Nose: No mucosal edema or sinus  tenderness. No epistaxis. Right sinus exhibits maxillary sinus tenderness. Right sinus exhibits no frontal sinus tenderness. Left sinus exhibits maxillary sinus tenderness. Left sinus exhibits no frontal sinus tenderness.  Mouth/Throat: Oropharynx is clear and moist. No oropharyngeal exudate.  Eyes: Conjunctivae are normal. Right eye exhibits no discharge. Left eye exhibits no discharge. No scleral icterus.  Neck: Normal range of motion. Neck supple. No JVD present. No thyromegaly present.  Cardiovascular: Normal rate, regular rhythm and intact distal pulses. Exam reveals no gallop and no friction rub.  No murmur heard. Pulmonary/Chest: Effort normal and breath sounds normal. No respiratory distress. She has no wheezes. She has no rales. She exhibits no tenderness.  Abdominal: Soft. Bowel sounds are normal. She exhibits no distension and no mass. There is no tenderness. There is no rebound and no guarding.  Musculoskeletal: Normal range of motion. She exhibits no edema, tenderness or deformity.  Lymphadenopathy:    She has no cervical adenopathy.  Neurological: She is alert and oriented to person, place, and time.  Skin: Skin is warm and dry. No rash noted. She is not diaphoretic. No erythema. No pallor.  Vitals reviewed.   Lab Results  Component Value Date   WBC 19.3 Repeated and verified X2. (HH) 05/02/2017   HGB 11.8 (L) 05/02/2017   HCT 37.2 05/02/2017   PLT 483.0 (H) 05/02/2017   GLUCOSE 144 (H) 02/28/2017   CHOL 172 12/31/2016   TRIG 95.0 12/31/2016   HDL 53.40 12/31/2016   LDLDIRECT 137.9 02/04/2013   LDLCALC 99 12/31/2016   ALT 17 12/31/2016   AST 17 12/31/2016   NA 141 02/28/2017   K 4.9 02/28/2017   CL 101 02/28/2017   CREATININE 0.84 02/28/2017   BUN 24 02/28/2017   CO2 24 02/28/2017   TSH 2.47 04/26/2015   HGBA1C 7.5 (H) 05/02/2017   MICROALBUR 1.2 12/31/2016    Mr Lumbar Spine Wo Contrast  Result Date: 11/14/2016 CLINICAL DATA:  Chronic right low back.  Right  thigh numbness. EXAM: MRI LUMBAR SPINE WITHOUT CONTRAST TECHNIQUE: Multiplanar, multisequence MR imaging of the lumbar spine was performed. No intravenous contrast was administered. COMPARISON:  02/06/2013 FINDINGS: Segmentation:  Standard. Alignment:  Physiologic. Vertebrae:  No fracture, evidence of discitis, or bone lesion. Conus medullaris: Extends to the L1 level and appears normal. Paraspinal and other soft tissues: Negative. Multiple stable T2 hyperintense bilateral renal masses most consistent with small cysts. Disc levels: Disc spaces: Degenerative disc disease with disc height loss at L2-3 and L3-4. T11-12: Mild broad-based disc bulge on the sagittal images. No foraminal or central canal stenosis. T12-L1: No significant disc bulge. No evidence of neural foraminal stenosis. No central canal stenosis. L1-L2: No significant disc bulge. No evidence of neural foraminal stenosis. No central canal stenosis. Mild bilateral facet arthropathy. L2-L3: Mild broad-based disc bulge. Mild bilateral facet  arthropathy. Minimal spinal stenosis. Mild right foraminal stenosis. No left foraminal stenosis. L3-L4: Broad-based disc bulge. Moderate right and mild left facet arthropathy. Left lateral recess stenosis. Mild left foraminal stenosis. Mild right foraminal stenosis. No central canal stenosis. L4-L5: Broad-based disc bulge. Severe bilateral facet arthropathy with a prominent bony hypertrophic changes involving the left extraforaminal portion of the facet adjacent to the extraforaminal portion of the left L4 nerve root. No evidence of neural foraminal stenosis. No central canal stenosis. L5-S1: Broad left paracentral/foraminal disc protrusion. No evidence of neural foraminal stenosis. No central canal stenosis. IMPRESSION: 1. Diffuse lumbar spine spondylosis as described above. 2. At L4-5 there is a broad-based Severe bilateral facet arthropathy with a prominent bony hypertrophic changes involving the left extraforaminal  portion of the facet adjacent to the extraforaminal portion of the left L4 nerve root. 3. At L5-S1 there is a broad left paracentral/foraminal disc protrusion. Electronically Signed   By: Kathreen Devoid   On: 11/14/2016 12:57    Assessment & Plan:   Sharlisa was seen today for sinusitis.  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  Her anemia is better but she is still mildly anemic.  I will recheck her vitamin levels. -     CBC with Differential/Platelet; Future -     IBC panel; Future -     Vitamin B12; Future -     Folate; Future -     Ferritin; Future  Thiamine deficiency- Her H&H have improved some.  I will reeheck her vitamin B1 level to see that she has achieved a normal range. -     CBC with Differential/Platelet; Future -     Vitamin B1; Future  Essential hypertension- Her blood pressure is adequately well controlled.  Type 2 diabetes mellitus with hyperglycemia, with long-term current use of insulin (HCC)-her A1c is down to 7.5%.  This is a marked improvement.  She was praised for her success. -     Hemoglobin A1c; Future  Acute maxillary sinusitis, recurrence not specified- Will treat this with Omnicef. -     cefdinir (OMNICEF) 300 MG capsule; Take 1 capsule (300 mg total) 2 (two) times daily for 10 days by mouth.   I have discontinued Margaret Norman's pioglitazone. I am also having her start on cefdinir. Additionally, I am having her maintain her aspirin, Cyanocobalamin (VITAMIN B12 PO), Pyridoxine HCl (VITAMIN B-6 PO), docusate sodium, ONETOUCH VERIO FLEX SYSTEM, LANTUS SOLOSTAR, hydrochlorothiazide, Insulin Pen Needle, ONE TOUCH LANCETS, glucose blood, HYDROcodone-acetaminophen, thiamine, buPROPion, omeprazole, sertraline, metFORMIN, olmesartan, and Dulaglutide.  Meds ordered this encounter  Medications  . cefdinir  (OMNICEF) 300 MG capsule    Sig: Take 1 capsule (300 mg total) 2 (two) times daily for 10 days by mouth.    Dispense:  20 capsule    Refill:  0     Follow-up: Return if symptoms worsen or fail to improve.  Margaret Calico, MD

## 2017-05-02 NOTE — Patient Instructions (Signed)

## 2017-05-08 ENCOUNTER — Encounter: Payer: Self-pay | Admitting: Internal Medicine

## 2017-05-08 ENCOUNTER — Ambulatory Visit: Payer: Medicare Other | Admitting: Internal Medicine

## 2017-05-08 LAB — VITAMIN B1: Vitamin B1 (Thiamine): 338 nmol/L — ABNORMAL HIGH (ref 8–30)

## 2017-05-13 ENCOUNTER — Ambulatory Visit (INDEPENDENT_AMBULATORY_CARE_PROVIDER_SITE_OTHER): Payer: Medicare Other | Admitting: Internal Medicine

## 2017-05-13 VITALS — BP 122/80 | HR 92 | Temp 98.0°F | Resp 16 | Wt 172.4 lb

## 2017-05-13 DIAGNOSIS — E669 Obesity, unspecified: Secondary | ICD-10-CM

## 2017-05-13 DIAGNOSIS — E785 Hyperlipidemia, unspecified: Secondary | ICD-10-CM | POA: Diagnosis not present

## 2017-05-13 DIAGNOSIS — Z794 Long term (current) use of insulin: Secondary | ICD-10-CM | POA: Diagnosis not present

## 2017-05-13 DIAGNOSIS — E1165 Type 2 diabetes mellitus with hyperglycemia: Secondary | ICD-10-CM

## 2017-05-13 MED ORDER — INSULIN PEN NEEDLE 32G X 4 MM MISC: 3 refills | Status: DC

## 2017-05-13 MED ORDER — DULAGLUTIDE 1.5 MG/0.5ML ~~LOC~~ SOAJ: SUBCUTANEOUS | 11 refills | Status: DC
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
## 2017-05-13 NOTE — Patient Instructions (Signed)
Please continue: - Metformin 1000 mg 2x a day with meals - Lantus 30 units at night - Trulicity 1.5 mg weekly  Please return in 3 months with your sugar log.

## 2017-06-20 ENCOUNTER — Encounter: Payer: Self-pay | Admitting: Internal Medicine

## 2017-07-30 ENCOUNTER — Encounter: Payer: Self-pay | Admitting: Family Medicine

## 2017-07-30 ENCOUNTER — Ambulatory Visit (INDEPENDENT_AMBULATORY_CARE_PROVIDER_SITE_OTHER): Payer: Medicare Other | Admitting: Family Medicine

## 2017-07-30 VITALS — BP 138/88 | HR 106 | Temp 98.7°F | Ht 63.0 in | Wt 172.0 lb

## 2017-07-30 DIAGNOSIS — J069 Acute upper respiratory infection, unspecified: Secondary | ICD-10-CM | POA: Insufficient documentation

## 2017-07-30 NOTE — Assessment & Plan Note (Signed)
Likely viral in nature.  - counseled on supportive care - f/u prn

## 2017-07-30 NOTE — Progress Notes (Signed)
[name] - 67 y.o. female MRN [id number]  Date of birth: [birth year]  SUBJECTIVE:  Including CC & ROS.  Chief Complaint  Patient presents with  . Cough    Margaret Norman is a 66 y.o. female that is presenting with a cough She woke up yesterday morning with a cough and congestion. She does have sinus drainage. She took some Mucinex with no improvement in her symptoms. Worse at night, she is unable to sleep. Denies fevers or chills.    Review of Systems  Constitutional: Negative for fever.  HENT: Positive for rhinorrhea, sinus pressure and sinus pain.   Respiratory: Positive for cough.   Cardiovascular: Negative for chest pain.  Gastrointestinal: Negative for abdominal pain.  Musculoskeletal: Negative for arthralgias.  Neurological: Negative for weakness.  Hematological: Negative for adenopathy.  Psychiatric/Behavioral: Negative for agitation.    HISTORY: Past Medical, Surgical, Social, and Family History Reviewed & Updated per EMR.   Pertinent Historical Findings include:  Past Medical History:  Diagnosis Date  . Arthritis   . Chest pain    Nuclear, adenosine,  December, 2013, low risk nuclear scan with small, moderate in intensity, fixed anteroseptal defect. This is possibly related to an LBBB versus small prior infarct. No ischemia  . Depression   . Gallstones 11-06  . GERD (gastroesophageal reflux disease)   . HTN (hypertension)   . Hyperlipidemia   . Kidney mass 01/28/2014  . LBBB (left bundle branch block)   . Nephrolithiasis   . Pneumonia   . 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 Type II or unspecified type diabetes mellitus without mention of complication, not stated as uncontrolled     Past Surgical History:  Procedure Laterality Date  . CESAREAN SECTION     x2 ? w/appy  . CHOLECYSTECTOMY    . ESOPHAGOGASTRODUODENOSCOPY    . HERNIA REPAIR    . KNEE ARTHROSCOPY Right 11/2005  . LIVER BIOPSY    . PANCREATIC CYST EXCISION    . SPLENECTOMY    .  TONSILLECTOMY AND ADENOIDECTOMY      Allergies  Allergen Reactions  . Invokana [Canagliflozin] Other (See Comments)    UTI's  . Amoxicillin     Sore tongue    Family History  Problem Relation Age of Onset  . Liver disease Mother   . Dementia Mother   . Diabetes Mother        borderline  . Coronary artery disease Father   . Heart attack Father   . Hypertension Father   . Heart disease Father   . Cancer Other        leukemia  . Stroke Maternal Grandfather   . Hyperlipidemia Brother   . Diabetes Maternal Grandmother   . Cancer Paternal Uncle        unknown  . Heart attack Paternal Grandmother   . Heart attack Paternal Uncle   . Hypertension Brother   . Diabetes Daughter        borderline     Social History   Socioeconomic History  . Marital status: Married    Spouse name: Laurian Edrington  . Number of children: 2  . Years of education: Not on file  . Highest education level: Not on file  Social Needs  . Financial resource strain: Not on file  . Food insecurity - worry: Not on file  . Food insecurity - inability: Not on file  . Transportation needs - medical: Not on file  . Transportation needs - non-medical: Not  on file  Occupational History  . Occupation: CSR    Employer: [employer]  . Occupation: retired  Tobacco Use  . Smoking status: Never Smoker  . Smokeless tobacco: Never Used  Substance and Sexual Activity  . Alcohol use: No  . Drug use: No  . Sexual activity: Not Currently  Other Topics Concern  . Not on file  Social History Narrative   Regular Exercise -  NO     PHYSICAL EXAM:  VS: BP 138/88 (BP Location: Left Arm, Patient Position: Sitting, Cuff Size: Normal)   Pulse (!) 106   Temp 98.7 F (37.1 C) (Oral)   Ht 5\' 3"  (1.6 m)   Wt 172 lb (78 kg)   SpO2 98%   BMI 30.47 kg/m  Physical Exam Gen: NAD, alert, cooperative with exam,  ENT: normal lips, normal nasal mucosa, tympanic membranes clear and intact bilaterally, normal  oropharynx, no cervical lymphadenopathy Eye: normal EOM, normal conjunctiva and lids CV:  no edema, +2 pedal pulses, regular rate and rhythm, S1-S2   Resp: no accessory muscle use, non-labored, clear to auscultation bilaterally, no crackles or wheezes Skin: no rashes, no areas of induration  Neuro: normal tone, normal sensation to touch Psych:  normal insight, alert and oriented MSK: Normal gait, normal strength       ASSESSMENT & PLAN:   Upper respiratory tract infection Likely viral in nature.  - counseled on supportive care - f/u prn

## 2017-07-30 NOTE — Patient Instructions (Signed)
Please try things such as zyrtec-D or allegra-D which is an antihistamine and decongestant.   Please try afrin which will help with nasal congestion but use for only three days.   Please also try using a netti pot on a regular occasion.  Honey can help with a sore throat.   Vick's and delsym can help with cough 

## 2017-08-08 ENCOUNTER — Other Ambulatory Visit: Payer: Self-pay | Admitting: Internal Medicine

## 2017-08-08 DIAGNOSIS — Z7984 Long term (current) use of oral hypoglycemic drugs: Secondary | ICD-10-CM | POA: Diagnosis not present

## 2017-08-08 DIAGNOSIS — H524 Presbyopia: Secondary | ICD-10-CM | POA: Diagnosis not present

## 2017-08-08 DIAGNOSIS — H25813 Combined forms of age-related cataract, bilateral: Secondary | ICD-10-CM | POA: Diagnosis not present

## 2017-08-08 DIAGNOSIS — E118 Type 2 diabetes mellitus with unspecified complications: Secondary | ICD-10-CM

## 2017-08-08 DIAGNOSIS — Z794 Long term (current) use of insulin: Principal | ICD-10-CM

## 2017-08-08 DIAGNOSIS — E119 Type 2 diabetes mellitus without complications: Secondary | ICD-10-CM | POA: Diagnosis not present

## 2017-08-08 DIAGNOSIS — H5203 Hypermetropia, bilateral: Secondary | ICD-10-CM | POA: Diagnosis not present

## 2017-08-08 DIAGNOSIS — H52223 Regular astigmatism, bilateral: Secondary | ICD-10-CM | POA: Diagnosis not present

## 2017-08-13 ENCOUNTER — Encounter: Payer: Self-pay | Admitting: Internal Medicine

## 2017-08-13 ENCOUNTER — Ambulatory Visit (INDEPENDENT_AMBULATORY_CARE_PROVIDER_SITE_OTHER): Payer: Medicare Other | Admitting: Internal Medicine

## 2017-08-13 VITALS — BP 120/76 | HR 95 | Ht 63.0 in | Wt 167.6 lb

## 2017-08-13 DIAGNOSIS — E663 Overweight: Secondary | ICD-10-CM

## 2017-08-13 DIAGNOSIS — E1165 Type 2 diabetes mellitus with hyperglycemia: Secondary | ICD-10-CM | POA: Diagnosis not present

## 2017-08-13 DIAGNOSIS — Z794 Long term (current) use of insulin: Secondary | ICD-10-CM | POA: Diagnosis not present

## 2017-08-13 DIAGNOSIS — E785 Hyperlipidemia, unspecified: Secondary | ICD-10-CM

## 2017-08-13 LAB — POCT GLYCOSYLATED HEMOGLOBIN (HGB A1C): Hemoglobin A1C: 8.7

## 2017-08-13 MED ORDER — EMPAGLIFLOZIN 10 MG PO TABS: 10.0000 mg | ORAL_TABLET | Freq: Every day | ORAL | 5 refills | Status: DC

## 2017-08-13 NOTE — Patient Instructions (Addendum)
Please continue: - Metformin 1000 mg 2x a day with meals - Lantus 30 units at night - Trulicity 1.5 mg weekly  Please start: - Jardiance 10 mg daily before b'fast  Please return in 3 months with your sugar log.

## 2017-08-13 NOTE — Progress Notes (Signed)
Patient ID: Margaret Norman, female   DOB: 1950/11/15, 67 y.o.   MRN: [id number]   HPI: Margaret Norman is a 67 y.o.-year-old female, returning for f/u for DM2, dx in 2008, insulin-dependent since 2016, uncontrolled, without long term complications. Last visit 3 mo ago.  Since last visit, sugars increased over the holidays, and she also had an upper respiratory infection.  Last hemoglobin A1c was: Lab Results  Component Value Date   HGBA1C 8.7 08/13/2017   HGBA1C 7.5 (H) 05/02/2017   HGBA1C 10.5 (H) 12/31/2016  03/19/2017: HbA1c calculated from fructosamine is 6.7%! 12/31/2016: HbA1c calculated from fructosamine: 6.9%!  Pt is on: - Metformin 1000 mg 2x a day, with meals - Lantus 30 units at bedtime  - Trulicity 1.5 mg weekly  - restarted 02/2017 Stopped Actos when started trulicity back. She was on Trulicity but became expensive >> changed to Actos. She was on Invokana >> recurrent UTIs.  Pt checks her sugars 1-2 times a day: - am: 72-149,  vacation: 160-264 >> 130s, 190 x1 >> 99, 130-150 - 2h after b'fast: 140s >> n/c - before lunch: n/c - 2h after lunch: n/c - before dinner: n/c - 2h after dinner: n/c - bedtime: 137 >> 200s - nighttime: n/c Lowest sugar was 72 >> 130 >> 99; she has hypoglycemia awareness in the 70s. Highest sugar was 200s >> 194 >> 200s.  Glucometer: OneTouch Verio  Pt's meals are: - Breakfast: usually skip, Sun: scrambled eggs, bacon, bisquit and gravy; raisin brain cereal; bran muffins - Lunch: skips or salad - Dinner: frozen meal - Snacks: regular Dr. Malachi Bonds   -No CKD, last BUN/creatinine:  Lab Results  Component Value Date   BUN 24 02/28/2017   BUN 30 (H) 12/31/2016   CREATININE 0.84 02/28/2017   CREATININE 1.07 12/31/2016  On Benicar. -She has HL; last set of lipids: Lab Results  Component Value Date   CHOL 172 12/31/2016   HDL 53.40 12/31/2016   LDLCALC 99 12/31/2016   LDLDIRECT 137.9 02/04/2013   TRIG 95.0 12/31/2016   CHOLHDL 3  12/31/2016  Not on a statin. - last eye exam was in 07/2017: No DR.  She has cataracts.  Dr. Marica Otter. -No numbness and tingling in her feet.  She has restless leg syndrome. On ASA 81.  Pt has FH of DM in MGM.  ROS: Constitutional: no weight gain/no weight loss, no fatigue, no subjective hyperthermia, no subjective hypothermia Eyes: + blurry vision, no xerophthalmia ENT: no sore throat, no nodules palpated in throat, no dysphagia, no odynophagia, + hoarseness Cardiovascular: no CP/no SOB/no palpitations/no leg swelling Respiratory: no cough/no SOB/no wheezing Gastrointestinal: no N/no V/no D/no C/no acid reflux Musculoskeletal: no muscle aches/no joint aches Skin: no rashes, + hair loss Neurological: no tremors/no numbness/no tingling/no dizziness  I reviewed pt's medications, allergies, PMH, social hx, family hx, and changes were documented in the history of present illness. Otherwise, unchanged from my initial visit note.   Past Medical History:  Diagnosis Date  . Arthritis   . Chest pain    Nuclear, adenosine,  December, 2013, low risk nuclear scan with small, moderate in intensity, fixed anteroseptal defect. This is possibly related to an LBBB versus small prior infarct. No ischemia  . Depression   . Gallstones 11-06  . GERD (gastroesophageal reflux disease)   . HTN (hypertension)   . Hyperlipidemia   . Kidney mass 01/28/2014  . LBBB (left bundle branch block)   . Nephrolithiasis   . Pneumonia   .  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 Type II or unspecified type diabetes mellitus without mention of complication, not stated as uncontrolled    Past Surgical History:  Procedure Laterality Date  . CESAREAN SECTION     x2 ? w/appy  . CHOLECYSTECTOMY    . ESOPHAGOGASTRODUODENOSCOPY    . HERNIA REPAIR    . KNEE ARTHROSCOPY Right 11/2005  . LIVER BIOPSY    . PANCREATIC CYST EXCISION    . SPLENECTOMY    . TONSILLECTOMY AND ADENOIDECTOMY     Social History    Socioeconomic History  . Marital status: Married    Spouse name: Genetta Fiero  . Number of children: 2  . Years of education: Not on file  . Highest education level: Not on file  Social Needs  . Financial resource strain: Not on file  . Food insecurity - worry: Not on file  . Food insecurity - inability: Not on file  . Transportation needs - medical: Not on file  . Transportation needs - non-medical: Not on file  Occupational History  . Occupation: CSR    Employer: [employer]  . Occupation: retired  Tobacco Use  . Smoking status: Never Smoker  . Smokeless tobacco: Never Used  Substance and Sexual Activity  . Alcohol use: No  . Drug use: No  . Sexual activity: Not Currently  Other Topics Concern  . Not on file  Social History Narrative   Regular Exercise -  NO   Current Outpatient Medications on File Prior to Visit  Medication Sig Dispense Refill  . aspirin 81 MG EC tablet Take 81 mg by mouth daily.      . Blood Glucose Monitoring Suppl (ONETOUCH VERIO FLEX SYSTEM) W/DEVICE KIT 1 kit by Does not apply route 3 (three) times daily. 1 kit 1  . buPROPion (WELLBUTRIN XL) 150 MG 24 hr tablet TAKE 1 TABLET BY MOUTH  EVERY MORNING 90 tablet 1  . Cyanocobalamin (VITAMIN B12 PO) Take 2,500 mcg by mouth daily.    Marland Kitchen docusate sodium (COLACE) 100 MG capsule Take 100 mg by mouth daily.    . Dulaglutide (TRULICITY) 1.5 EH/6.3JS SOPN Inject 1.5 mg weekly 4 pen 11  . glucose blood (ONETOUCH VERIO) test strip Test three times daily as directed. DX: E11.9 300 each 3  . hydrochlorothiazide (MICROZIDE) 12.5 MG capsule Take 1 capsule (12.5 mg total) by mouth daily. 90 capsule 1  . Insulin Pen Needle (CAREFINE PEN NEEDLES) 32G X 4 MM MISC Use 1x a day 100 each 3  . LANTUS SOLOSTAR 100 UNIT/ML Solostar Pen INJECT SUBCUTANEOUSLY 30  UNITS AT BEDTIME 30 mL 3  . metFORMIN (GLUCOPHAGE) 1000 MG tablet TAKE 1 TABLET BY MOUTH TWO  TIMES DAILY WITH MEALS 180 tablet 1  . olmesartan (BENICAR) 40 MG  tablet Take 1 tablet (40 mg total) by mouth daily. 90 tablet 3  . omeprazole (PRILOSEC) 40 MG capsule TAKE 1 CAPSULE BY MOUTH  DAILY 90 capsule 1  . ONE TOUCH LANCETS MISC Test three times daily as directed. DX: E11.9 300 each 3  . Pyridoxine HCl (VITAMIN B-6 PO) Take 1 tablet by mouth daily.     . sertraline (ZOLOFT) 50 MG tablet TAKE 1 TABLET BY MOUTH  DAILY 90 tablet 1  . thiamine 100 MG tablet Take 1 tablet (100 mg total) by mouth daily. 90 tablet 3   No current facility-administered medications on file prior to visit.    Allergies  Allergen Reactions  . Invokana [Canagliflozin]  Other (See Comments)    UTI's  . Amoxicillin     Sore tongue   Family History  Problem Relation Age of Onset  . Liver disease Mother   . Dementia Mother   . Diabetes Mother        borderline  . Coronary artery disease Father   . Heart attack Father   . Hypertension Father   . Heart disease Father   . Cancer Other        leukemia  . Stroke Maternal Grandfather   . Hyperlipidemia Brother   . Diabetes Maternal Grandmother   . Cancer Paternal Uncle        unknown  . Heart attack Paternal Grandmother   . Heart attack Paternal Uncle   . Hypertension Brother   . Diabetes Daughter        borderline    PE: BP 120/76 (BP Location: Left Arm, Patient Position: Sitting, Cuff Size: Normal)   Pulse 95   Ht 5' 3"  (1.6 m)   Wt 167 lb 9.6 oz (76 kg)   SpO2 97%   BMI 29.69 kg/m  Wt Readings from Last 3 Encounters:  08/13/17 167 lb 9.6 oz (76 kg)  07/30/17 172 lb (78 kg)  05/13/17 172 lb 6.4 oz (78.2 kg)   Constitutional: overweight, in NAD Eyes: PERRLA, EOMI, no exophthalmos ENT: moist mucous membranes, + left prominent thyroid, no cervical lymphadenopathy Cardiovascular: Tachycardia, RR, No MRG Respiratory: CTA B Gastrointestinal: abdomen soft, NT, ND, BS+ Musculoskeletal: no deformities, strength intact in all 4 Skin: moist, warm, no rashes Neurological: no tremor with outstretched hands, DTR  normal in all 4   ASSESSMENT: 1. DM2, insulin-dependent, uncontrolled, without long term complications, but with hyperglycemia  2.  Overweight  3. HL  PLAN:  1. Patient with long-standing, uncontrolled, diabetes, on oral antidiabetic regimen, basal insulin, and Trulicity.  This was expensive at last visit, but she had great improvement in control of her diabetes and her weight on it, so she decided to continue it. - In the past, fructosamine levels have been more accurate for her, however, at last visit, her HbA1c was more consistent with the sugars in her log, at 7.5%.  We did not check a fructosamine level then. - At this visit, sugars are higher, but she only checks in the morning, and I suspect that her sugars increase as the day goes by.  Therefore, I suggested to add Jardiance, which should help with postprandial sugars.  She had frequent yeast infections on Invokana, but I explained to Vania Rea is not a strong and it works slightly differently (it is a selective SGL T-2 inhibitor compared to Invokana, which is a nonselective SGLT 1+2 inhibitor) We will also started a lower dose.  However, if she still develops yeast infections or urinary tract infections, we will need to stop and we may try glipizide.  She agrees with this plan. - I suggested to:  Patient Instructions  Please continue: - Metformin 1000 mg 2x a day with meals - Lantus 30 units at night - Trulicity 1.5 mg weekly  Please start: - Jardiance 10 mg daily before b'fast  Please return in 3 months with your sugar log.   - today, HbA1c is 8.7% (higher) - continue checking sugars at different times of the day - check 1-2x a day, rotating checks - advised for yearly eye exams >> she is UTD - Return to clinic in 3 mo with sugar log, will need a BMP at that time  2.  Overweight -Her weight improved after we restarted Trulicity and after she came off Actos -Since last visit, she lost 5 more pounds -Jardiance will also  help her lose more weight  3. HL -Reviewed the last 2 lipid panels.  Although she is not on a statin, her LDL greatly improved to less than 100 on the last check.  - For now, she continues off the statins  Philemon Kingdom, MD PhD [facility]

## 2017-08-21 ENCOUNTER — Encounter: Payer: Self-pay | Admitting: Internal Medicine

## 2017-09-17 ENCOUNTER — Ambulatory Visit (INDEPENDENT_AMBULATORY_CARE_PROVIDER_SITE_OTHER): Payer: Medicare Other | Admitting: Gastroenterology

## 2017-09-17 ENCOUNTER — Encounter: Payer: Self-pay | Admitting: Gastroenterology

## 2017-09-17 VITALS — BP 136/80 | HR 93 | Ht 62.0 in | Wt 164.8 lb

## 2017-09-17 DIAGNOSIS — R1013 Epigastric pain: Secondary | ICD-10-CM | POA: Diagnosis not present

## 2017-09-17 DIAGNOSIS — G8929 Other chronic pain: Secondary | ICD-10-CM | POA: Diagnosis not present

## 2017-09-17 DIAGNOSIS — R140 Abdominal distension (gaseous): Secondary | ICD-10-CM | POA: Diagnosis not present

## 2017-09-17 DIAGNOSIS — K219 Gastro-esophageal reflux disease without esophagitis: Secondary | ICD-10-CM | POA: Diagnosis not present

## 2017-09-17 NOTE — Progress Notes (Signed)
[facility] Consult Note:  History: Margaret Norman 09/17/2017  Referring physician: Janith Lima, MD  Reason for consult/chief complaint: Gastroesophageal Reflux (Prilosec typically manages symptoms but occasionally needs TUMS as well) and Abdominal Pain (bloating, gas and pain after eating, tums helped, last several weeks but better now. Has a hernia)   Subjective  HPI:  This is a pleasant 67 year old woman referred for upper abdominal pain and bloating.  She last saw Dr. 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  A colonoscopy at that time found no polyps.  She also describes some upper abdominal pain and bloating when Dr. Deatra Ina saw her. For the last couple of months she has had postprandial bloating and a dull pain that seemed worse after meals.  There is no nausea vomiting or dysphagia. The Jardiance started after the onset of the symptoms, and she has been on metformin for years.  She has also been on Trulicity over a year, except for some periods of time when she could not take it due to cost.   ROS:  Review of Systems  Constitutional: Negative for appetite change and unexpected weight change.  HENT: Negative for mouth sores and voice change.   Eyes: Negative for pain and redness.  Respiratory: Negative for cough and shortness of breath.   Cardiovascular: Negative for chest pain and palpitations.  Genitourinary: Negative for dysuria and hematuria.  Musculoskeletal: Negative for arthralgias and myalgias.  Skin: Negative for pallor and rash.  Neurological: Negative for weakness and headaches.  Hematological: Negative for adenopathy.  Psychiatric/Behavioral: Positive for dysphoric mood.     Past Medical History: Past Medical History:  Diagnosis Date  . Arthritis   . Chest pain    Nuclear, adenosine,  December, 2013, low risk nuclear scan with small, moderate in intensity,  fixed anteroseptal defect. This is possibly related to an LBBB versus small prior infarct. No ischemia  . Depression   . Gallstones 11-06  . GERD (gastroesophageal reflux disease)   . HTN (hypertension)   . Hyperlipidemia   . Kidney mass 01/28/2014  . LBBB (left bundle branch block)   . Nephrolithiasis   . Pneumonia   . 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 Type II or unspecified type diabetes mellitus without mention of complication, not stated as uncontrolled      Past Surgical History: Past Surgical History:  Procedure Laterality Date  . CESAREAN SECTION     x2 ? w/appy  . CHOLECYSTECTOMY    . ESOPHAGOGASTRODUODENOSCOPY    . HERNIA REPAIR    . KNEE ARTHROSCOPY Right 11/2005  . LIVER BIOPSY    . PANCREATIC CYST EXCISION    . SPLENECTOMY    . TONSILLECTOMY AND ADENOIDECTOMY       Family History: Family History  Problem Relation Age of Onset  . Liver disease Mother   . Dementia Mother   . Diabetes Mother        borderline  . Coronary artery disease Father   . Heart attack Father   . Hypertension Father   . Heart disease Father   . Cancer Other        leukemia  . Stroke Maternal Grandfather   . Hyperlipidemia Brother        Amyloidosis  . Diabetes Maternal Grandmother   . Cancer Paternal Uncle        unknown  . Heart attack Paternal Grandmother   . Heart attack  Paternal Uncle   . Hypertension Brother   . Diabetes Daughter        borderline  . Colon cancer Neg Hx     Social History: Social History   Socioeconomic History  . Marital status: Married    Spouse name: [name]  . Number of children: 2  . Years of education: Not on file  . Highest education level: Not on file  Occupational History  . Occupation: CSR    Employer: [employer]  . Occupation: retired  Scientific laboratory technician  . Financial resource strain: Not on file  . Food insecurity:    Worry: Not on file    Inability: Not on file  . Transportation needs:    Medical: Not on  file    Non-medical: Not on file  Tobacco Use  . Smoking status: Never Smoker  . Smokeless tobacco: Never Used  Substance and Sexual Activity  . Alcohol use: No  . Drug use: No  . Sexual activity: Not Currently  Lifestyle  . Physical activity:    Days per week: Not on file    Minutes per session: Not on file  . Stress: Not on file  Relationships  . Social connections:    Talks on phone: Not on file    Gets together: Not on file    Attends religious service: Not on file    Active member of club or organization: Not on file    Attends meetings of clubs or organizations: Not on file    Relationship status: Not on file  Other Topics Concern  . Not on file  Social History Narrative   Regular Exercise -  NO    Allergies: Allergies  Allergen Reactions  . Invokana [Canagliflozin] Other (See Comments)    UTI's  . Amoxicillin     Sore tongue    Outpatient Meds: Current Outpatient Medications  Medication Sig Dispense Refill  . aspirin 81 MG EC tablet Take 81 mg by mouth daily.      . Blood Glucose Monitoring Suppl (ONETOUCH VERIO FLEX SYSTEM) W/DEVICE KIT 1 kit by Does not apply route 3 (three) times daily. 1 kit 1  . buPROPion (WELLBUTRIN XL) 150 MG 24 hr tablet TAKE 1 TABLET BY MOUTH  EVERY MORNING 90 tablet 1  . Cyanocobalamin (VITAMIN B12 PO) Take 2,500 mcg by mouth daily.    Marland Kitchen docusate sodium (COLACE) 100 MG capsule Take 100 mg by mouth daily.    . Dulaglutide (TRULICITY) 1.5 FY/1.0FB SOPN Inject 1.5 mg weekly 4 pen 11  . empagliflozin (JARDIANCE) 10 MG TABS tablet Take 10 mg by mouth daily. 30 tablet 5  . glucose blood (ONETOUCH VERIO) test strip Test three times daily as directed. DX: E11.9 300 each 3  . Insulin Pen Needle (CAREFINE PEN NEEDLES) 32G X 4 MM MISC Use 1x a day 100 each 3  . LANTUS SOLOSTAR 100 UNIT/ML Solostar Pen INJECT SUBCUTANEOUSLY 30  UNITS AT BEDTIME 30 mL 3  . metFORMIN (GLUCOPHAGE) 1000 MG tablet TAKE 1 TABLET BY MOUTH TWO  TIMES DAILY WITH MEALS  180 tablet 1  . olmesartan (BENICAR) 40 MG tablet Take 1 tablet (40 mg total) by mouth daily. 90 tablet 3  . omeprazole (PRILOSEC) 40 MG capsule TAKE 1 CAPSULE BY MOUTH  DAILY 90 capsule 1  . ONE TOUCH LANCETS MISC Test three times daily as directed. DX: E11.9 300 each 3  . Pyridoxine HCl (VITAMIN B-6 PO) Take 1 tablet by mouth daily.     Marland Kitchen  sertraline (ZOLOFT) 50 MG tablet TAKE 1 TABLET BY MOUTH  DAILY 90 tablet 1   No current facility-administered medications for this visit.       ___________________________________________________________________ Objective   Exam:  BP 136/80   Pulse 93   Ht 5' 2"  (1.575 m)   Wt 164 lb 12.8 oz (74.8 kg)   BMI 30.14 kg/m    General: this is a(n) well-appearing woman  Eyes: sclera anicteric, no redness  ENT: oral mucosa moist without lesions, no cervical or supraclavicular lymphadenopathy, good dentition  CV: RRR without murmur, S1/S2, no JVD, no peripheral edema  Resp: clear to auscultation bilaterally, normal RR and effort noted GI: soft, mild epigastric tenderness, with active bowel sounds. No guarding or palpable organomegaly noted.  Weakness of abdominal midline musculature at site of hernia surgery Dictation #1 [id number]  [id number]  skin; warm and dry, no rash or jaundice noted  Neuro: awake, alert and oriented x 3. Normal gross motor function and fluent speech  Labs:  Hgb A1C 08/13/17 = 8.7 7.5 11/18 7/18 = 10.5  Radiologic Studies:  2003 GES - delayed  Assessment: Encounter Diagnoses  Name Primary?  . Abdominal pain, chronic, epigastric Yes  . Abdominal bloating   . Gastroesophageal reflux disease, esophagitis presence not specified     I suspect she has a gastric motility disorder such as impaired accommodation or perhaps mild delayed gastric emptying.  It is possibly a side effect of diabetic medicines, though difficult to determine since she cannot stop them.  She does not chronic nausea or vomiting or  weight loss, I do not think there is a need for gastric emptying study or trial of metoclopramide.  Plan:  I described the nature of this condition to her.  We gave her some written dietary advice and I will be glad to see her again as needed.  She was given our contact information.  Thank you for the courtesy of this consult.  Please call me with any questions or concerns.  Nelida Meuse III  CC: Janith Lima, MD

## 2017-09-17 NOTE — Patient Instructions (Signed)
If you are age 68 or older, your body mass index should be between 23-30. Your Body mass index is 30.14 kg/m. If this is out of the aforementioned range listed, please consider follow up with your Primary Care Provider.  If you are age 68 or younger, your body mass index should be between 19-25. Your Body mass index is 30.14 kg/m. If this is out of the aformentioned range listed, please consider follow up with your Primary Care Provider.   Follow up as needed. [phone]    Gastroparesis Gastroparesis, also called delayed gastric emptying, is a condition in which food takes longer than normal to empty from the stomach. The condition is usually long-lasting (chronic). What are the causes? This condition may be caused by:  An endocrine disorder, such as hypothyroidism or diabetes. Diabetes is the most common cause of this condition.  A nervous system disease, such as Parkinson disease or multiple sclerosis.  Cancer, infection, or surgery of the stomach or vagus nerve.  A connective tissue disorder, such as scleroderma.  Certain medicines.  In most cases, the cause is not known. What increases the risk? This condition is more likely to develop in:  People with certain disorders, including endocrine disorders, eating disorders, amyloidosis, and scleroderma.  People with certain diseases, including Parkinson disease or multiple sclerosis.  People with cancer or infection of the stomach or vagus nerve.  People who have had surgery on the stomach or vagus nerve.  People who take certain medicines.  Women.  What are the signs or symptoms? Symptoms of this condition include:  An early feeling of fullness when eating.  Nausea.  Weight loss.  Vomiting.  Heartburn.  Abdominal bloating.  Inconsistent blood glucose levels.  Lack of appetite.  Acid from the stomach coming up into the esophagus (gastroesophageal reflux).  Spasms of the stomach.  Symptoms may come and  go. How is this diagnosed? This condition is diagnosed with tests, such as:  Tests that check how long it takes food to move through the stomach and intestines. These tests include: ? Upper gastrointestinal (GI) series. In this test, X-rays of the intestines are taken after you drink a liquid. The liquid makes the intestines show up better on the X-rays. ? Gastric emptying scintigraphy. In this test, scans are taken after you eat food that contains a small amount of radioactive material. ? Wireless capsule GI monitoring system. This test involves swallowing a capsule that records information about movement through the stomach.  Gastric manometry. This test measures electrical and muscular activity in the stomach. It is done with a thin tube that is passed down the throat and into the stomach.  Endoscopy. This test checks for abnormalities in the lining of the stomach. It is done with a long, thin tube that is passed down the throat and into the stomach.  An ultrasound. This test can help rule out gallbladder disease or pancreatitis as a cause of your symptoms. It uses sound waves to take pictures of the inside of your body.  How is this treated? There is no cure for gastroparesis. This condition may be managed with:  Treatment of the underlying condition causing the gastroparesis.  Lifestyle changes, including exercise and dietary changes. Dietary changes can include: ? Changes in what and when you eat. ? Eating smaller meals more often. ? Eating low-fat foods. ? Eating low-fiber forms of high-fiber foods, such as cooked vegetables instead of raw vegetables. ? Having liquid foods in place of solid foods. Liquid  foods are easier to digest.  Medicines. These may be given to control nausea and vomiting and to stimulate stomach muscles.  Getting food through a feeding tube. This may be done in severe cases.  A gastric neurostimulator. This is a device that is inserted into the body with  surgery. It helps improve stomach emptying and control nausea and vomiting.  Follow these instructions at home:  Follow your health care provider's instructions about exercise and diet.  Take medicines only as directed by your health care provider. Contact a health care provider if:  Your symptoms do not improve with treatment.  You have new symptoms. Get help right away if:  You have severe abdominal pain that does not improve with treatment.  You have nausea that does not go away.  You cannot keep fluids down. This information is not intended to replace advice given to you by your health care provider. Make sure you discuss any questions you have with your health care provider. Document Released: 06/11/2005 Document Revised: 11/17/2015 Document Reviewed: 06/07/2014 Elsevier Interactive Patient Education  2018 [employer].  Thank you for choosing [facility]  Dr Wilfrid Lund III

## 2017-10-09 ENCOUNTER — Ambulatory Visit (INDEPENDENT_AMBULATORY_CARE_PROVIDER_SITE_OTHER): Payer: Medicare Other | Admitting: Internal Medicine

## 2017-10-09 ENCOUNTER — Encounter: Payer: Self-pay | Admitting: Internal Medicine

## 2017-10-09 VITALS — BP 128/84 | HR 94 | Ht 62.0 in | Wt 159.2 lb

## 2017-10-09 DIAGNOSIS — E669 Obesity, unspecified: Secondary | ICD-10-CM | POA: Diagnosis not present

## 2017-10-09 DIAGNOSIS — Z794 Long term (current) use of insulin: Secondary | ICD-10-CM | POA: Diagnosis not present

## 2017-10-09 DIAGNOSIS — E1165 Type 2 diabetes mellitus with hyperglycemia: Secondary | ICD-10-CM | POA: Diagnosis not present

## 2017-10-09 DIAGNOSIS — E785 Hyperlipidemia, unspecified: Secondary | ICD-10-CM | POA: Diagnosis not present

## 2017-10-09 MED ORDER — DULAGLUTIDE 1.5 MG/0.5ML ~~LOC~~ SOAJ: SUBCUTANEOUS | 3 refills | Status: DC

## 2017-10-09 NOTE — Progress Notes (Addendum)
Patient ID: Margaret Norman, female   DOB: 10/22/1950, 67 y.o.   MRN: [id number]   HPI: Margaret Norman is a 67 y.o.-year-old female, returning for f/u for DM2, dx in 2008, insulin-dependent since 2016, uncontrolled, without long term complications. Last visit 2 months ago.  She lost 7 lbs since last visit after starting Jardiance.  She is babysitting her 3 y/o grandson. She has a lot of stress at home with her son who is living with her and her husband.  Last hemoglobin A1c was: Lab Results  Component Value Date   HGBA1C 8.7 08/13/2017   HGBA1C 7.5 (H) 05/02/2017   HGBA1C 10.5 (H) 12/31/2016  03/19/2017: HbA1c calculated from fructosamine is 6.7%! 12/31/2016: HbA1c calculated from fructosamine: 6.9%!  Pt is on: - Metformin 1000 mg 2x a day with meals - Lantus 30 units at night - Trulicity 1.5 mg weekly - restarted 02/2017 - Jardiance 10 mg daily before breakfast - started 07/2017 Stopped Actos when started trulicity back. She was on Trulicity but became expensive >> changed to Actos. She was on Invokana >> recurrent UTIs.  Pt checks her sugars once a day: - am:  130s, 190 x1 >> 99, 130-150 >> 98-122 - 2h after b'fast: 140s >> n/c - before lunch: n/c - 2h after lunch: n/c - before dinner: n/c - 2h after dinner: n/c - bedtime: 137 >> 200s >> 257 x1 - nighttime: n/c Lowest sugar was 99 >> 98; she has hypoglycemia awareness in the 70s. Highest sugar was 200s >> 257.  Glucometer: OneTouch Verio  Pt's meals are: - Breakfast: usually skip, Sun: scrambled eggs, bacon, bisquit and gravy; raisin brain cereal; bran muffins - Lunch: skips or salad - Dinner: frozen meal - Snacks: regular Dr. Malachi Bonds   -No CKD, last BUN/creatinine:  Lab Results  Component Value Date   BUN 24 02/28/2017   BUN 30 (H) 12/31/2016   CREATININE 0.84 02/28/2017   CREATININE 1.07 12/31/2016  On Benicar. -+ HL; last set of lipids: Lab Results  Component Value Date   CHOL 172 12/31/2016   HDL 53.40  12/31/2016   LDLCALC 99 12/31/2016   LDLDIRECT 137.9 02/04/2013   TRIG 95.0 12/31/2016   CHOLHDL 3 12/31/2016  She is not on a statin - last eye exam was in 07/2017: No DR .  She has cataracts >> needs sx on L eye - 01/2018.  Dr. Marica Otter. - no numbness and tingling in her feet.  She has restless leg syndrome. On ASA 81.  Pt has FH of DM in MGM.  ROS: Constitutional: + weight loss, no fatigue, no subjective hyperthermia, no subjective hypothermia Eyes: no blurry vision, no xerophthalmia ENT: no sore throat, no nodules palpated in throat, no dysphagia, no odynophagia, no hoarseness Cardiovascular: no CP/no SOB/no palpitations/no leg swelling Respiratory: no cough/no SOB/no wheezing Gastrointestinal: no N/no V/no D/no C/no acid reflux Musculoskeletal: no muscle aches/no joint aches Skin: no rashes, no hair loss Neurological: no tremors/no numbness/no tingling/no dizziness  I reviewed pt's medications, allergies, PMH, social hx, family hx, and changes were documented in the history of present illness. Otherwise, unchanged from my initial visit note.   Past Medical History:  Diagnosis Date  . Arthritis   . Chest pain    Nuclear, adenosine,  December, 2013, low risk nuclear scan with small, moderate in intensity, fixed anteroseptal defect. This is possibly related to an LBBB versus small prior infarct. No ischemia  . Depression   . Gallstones 11-06  . GERD (  gastroesophageal reflux disease)   . HTN (hypertension)   . Hyperlipidemia   . Kidney mass 01/28/2014  . LBBB (left bundle branch block)   . Nephrolithiasis   . Pneumonia   . 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 Type II or unspecified type diabetes mellitus without mention of complication, not stated as uncontrolled    Past Surgical History:  Procedure Laterality Date  . CESAREAN SECTION     x2 ? w/appy  . CHOLECYSTECTOMY    . ESOPHAGOGASTRODUODENOSCOPY    . HERNIA REPAIR    . KNEE ARTHROSCOPY Right 11/2005   . LIVER BIOPSY    . PANCREATIC CYST EXCISION    . SPLENECTOMY    . TONSILLECTOMY AND ADENOIDECTOMY     Social History   Socioeconomic History  . Marital status: Married    Spouse name: Laquida Cotrell  . Number of children: 2  . Years of education: Not on file  . Highest education level: Not on file  Occupational History  . Occupation: CSR    Employer: [employer]  . Occupation: retired  Scientific laboratory technician  . Financial resource strain: Not on file  . Food insecurity:    Worry: Not on file    Inability: Not on file  . Transportation needs:    Medical: Not on file    Non-medical: Not on file  Tobacco Use  . Smoking status: Never Smoker  . Smokeless tobacco: Never Used  Substance and Sexual Activity  . Alcohol use: No  . Drug use: No  . Sexual activity: Not Currently  Lifestyle  . Physical activity:    Days per week: Not on file    Minutes per session: Not on file  . Stress: Not on file  Relationships  . Social connections:    Talks on phone: Not on file    Gets together: Not on file    Attends religious service: Not on file    Active member of club or organization: Not on file    Attends meetings of clubs or organizations: Not on file    Relationship status: Not on file  . Intimate partner violence:    Fear of current or ex partner: Not on file    Emotionally abused: Not on file    Physically abused: Not on file    Forced sexual activity: Not on file  Other Topics Concern  . Not on file  Social History Narrative   Regular Exercise -  NO   Current Outpatient Medications on File Prior to Visit  Medication Sig Dispense Refill  . aspirin 81 MG EC tablet Take 81 mg by mouth daily.      . Blood Glucose Monitoring Suppl (ONETOUCH VERIO FLEX SYSTEM) W/DEVICE KIT 1 kit by Does not apply route 3 (three) times daily. 1 kit 1  . buPROPion (WELLBUTRIN XL) 150 MG 24 hr tablet TAKE 1 TABLET BY MOUTH  EVERY MORNING 90 tablet 1  . Cyanocobalamin (VITAMIN B12 PO) Take 2,500  mcg by mouth daily.    Marland Kitchen docusate sodium (COLACE) 100 MG capsule Take 100 mg by mouth daily.    . Dulaglutide (TRULICITY) 1.5 WU/9.8JX SOPN Inject 1.5 mg weekly 4 pen 11  . empagliflozin (JARDIANCE) 10 MG TABS tablet Take 10 mg by mouth daily. 30 tablet 5  . glucose blood (ONETOUCH VERIO) test strip Test three times daily as directed. DX: E11.9 300 each 3  . Insulin Pen Needle (CAREFINE PEN NEEDLES) 32G X 4 MM MISC Use 1x  a day 100 each 3  . LANTUS SOLOSTAR 100 UNIT/ML Solostar Pen INJECT SUBCUTANEOUSLY 30  UNITS AT BEDTIME 30 mL 3  . metFORMIN (GLUCOPHAGE) 1000 MG tablet TAKE 1 TABLET BY MOUTH TWO  TIMES DAILY WITH MEALS 180 tablet 1  . olmesartan (BENICAR) 40 MG tablet Take 1 tablet (40 mg total) by mouth daily. 90 tablet 3  . omeprazole (PRILOSEC) 40 MG capsule TAKE 1 CAPSULE BY MOUTH  DAILY 90 capsule 1  . ONE TOUCH LANCETS MISC Test three times daily as directed. DX: E11.9 300 each 3  . Pyridoxine HCl (VITAMIN B-6 PO) Take 1 tablet by mouth daily.     . sertraline (ZOLOFT) 50 MG tablet TAKE 1 TABLET BY MOUTH  DAILY 90 tablet 1   No current facility-administered medications on file prior to visit.    Allergies  Allergen Reactions  . Invokana [Canagliflozin] Other (See Comments)    UTI's  . Amoxicillin     Sore tongue   Family History  Problem Relation Age of Onset  . Liver disease Mother   . Dementia Mother   . Diabetes Mother        borderline  . Coronary artery disease Father   . Heart attack Father   . Hypertension Father   . Heart disease Father   . Cancer Other        leukemia  . Stroke Maternal Grandfather   . Hyperlipidemia Brother        Amyloidosis  . Diabetes Maternal Grandmother   . Cancer Paternal Uncle        unknown  . Heart attack Paternal Grandmother   . Heart attack Paternal Uncle   . Hypertension Brother   . Diabetes Daughter        borderline  . Colon cancer Neg Hx     PE: BP 128/84   Pulse 94   Ht _0  (1.575 m)   Wt 159 lb 3.2 oz (72.2  kg)   SpO2 98%   BMI 29.12 kg/m  Wt Readings from Last 3 Encounters:  10/09/17 159 lb 3.2 oz (72.2 kg)  09/17/17 164 lb 12.8 oz (74.8 kg)  08/13/17 167 lb 9.6 oz (76 kg)   Constitutional: overweight, in NAD Eyes: PERRLA, EOMI, no exophthalmos ENT: moist mucous membranes, +left prominent thyroid, no cervical lymphadenopathy Cardiovascular: tachycardia, RR, No MRG Respiratory: CTA B Gastrointestinal: abdomen soft, NT, ND, BS+ Musculoskeletal: no deformities, strength intact in all 4 Skin: moist, warm, no rashes Neurological: no tremor with outstretched hands, DTR normal in all 4   ASSESSMENT: 1. DM2, insulin-dependent, uncontrolled, without long term complications, but with hyperglycemia  2.  Overweight  3. HL  PLAN:  1. Patient with long-standing, uncontrolled, type 2 diabetes, on oral antidiabetic regimen, basal insulin, and GLP-1 receptor agonist.  Trulicity is expensive, but she got very good results with it and decided to continue. -At last visit, sugars were higher, but she was only checking sugars in the morning and I advised her to also start checking later in the day.  We added Jardiance as I suspected that her sugars increase as the day went by.  She had frequent yeast infections on Invokana in the past.  No side effects to [place] for now.  She has tachycardia but believes this is from stress at home.   -In the past, fructosamine levels have been more accurate for her then directly measured HbA1c.  At next visit, we may need to check this. -We will check  a BMP today since we just started Jardiance at last visit -Unfortunately, she only checks sugars in the morning, and these are at goal, and she only checked once at bedtime and evaluate very high, 257.  He does not know why it it was this high.  We discussed about writing the sugars down and adding comments if the sugars are out of the ordinary for her.  I strongly advised her to start checking sugars later in the day,  also, to better understand patterns of her CBGs. - I suggested to:  Patient Instructions  Please continue: - Metformin 1000 mg 2x a day with meals - Lantus 30 units at night - Trulicity 1.5 mg weekly - Jardiance 10 mg daily before breakfast  Please stop at the lab.  Please check sugars once a day, rotating checktimes.  Bring log/meter at next visit.  Please return in 3 months with your sugar log.   - continue checking sugars at different times of the day - check 1x a day, rotating checks - advised for yearly eye exams >> she is UTD - Return to clinic in 3 mo with sugar log    2.  Overweight - lost 7 lbs since last visit! -Her weight improved after we started Trulicity and stopped Actos and also after starting Jardiance  3. HL -Reviewed the last 2 lipid panels: Although not on a statin, LDL greatly improved to less than 100 on the last check, in 12/2016 -For now continue without statin  Office Visit on 10/09/2017  Component Date Value Ref Range Status  . Glucose, Bld 10/09/2017 92  65 - 99 mg/dL Final   Comment: .            Fasting reference interval .   . BUN 10/09/2017 20  7 - 25 mg/dL Final  . Creat 10/09/2017 0.96  0.50 - 0.99 mg/dL Final   Comment: For patients >70 years of age, the reference limit for Creatinine is approximately 13% higher for people identified as African-American. .   . GFR, Est Non African American 10/09/2017 61  > OR = 60 mL/min/1.24m Final  . GFR, Est African American 10/09/2017 71  > OR = 60 mL/min/1.774mFinal  . BUN/Creatinine Ratio 0414/23/9532OT APPLICABLE  6 - 22 (calc) Final  . Sodium 10/09/2017 141  135 - 146 mmol/L Final  . Potassium 10/09/2017 4.4  3.5 - 5.3 mmol/L Final  . Chloride 10/09/2017 104  98 - 110 mmol/L Final  . CO2 10/09/2017 26  20 - 32 mmol/L Final  . Calcium 10/09/2017 10.0  8.6 - 10.4 mg/dL Final     CrPhilemon KingdomMD PhD [facility]

## 2017-10-09 NOTE — Patient Instructions (Addendum)
Please continue: - Metformin 1000 mg 2x a day with meals - Lantus 30 units at night - Trulicity 1.5 mg weekly - Jardiance 10 mg daily before breakfast  Please stop at the lab.  Please check sugars once a day, rotating checktimes.  Bring log/meter at next visit.  Please return in 3 months with your sugar log.

## 2017-10-10 LAB — BASIC METABOLIC PANEL WITHOUT GFR
BUN: 20 mg/dL (ref 7–25)
CO2: 26 mmol/L (ref 20–32)
Calcium: 10 mg/dL (ref 8.6–10.4)
Chloride: 104 mmol/L (ref 98–110)
Creat: 0.96 mg/dL (ref 0.50–0.99)
GFR, Est African American: 71 mL/min/1.73m2
GFR, Est Non African American: 61 mL/min/1.73m2
Glucose, Bld: 92 mg/dL (ref 65–99)
Potassium: 4.4 mmol/L (ref 3.5–5.3)
Sodium: 141 mmol/L (ref 135–146)

## 2017-10-23 HISTORY — PX: BREAST BIOPSY: SHX20

## 2017-10-28 ENCOUNTER — Encounter: Payer: Self-pay | Admitting: Internal Medicine

## 2017-11-02 ENCOUNTER — Other Ambulatory Visit: Payer: Self-pay | Admitting: Internal Medicine

## 2017-11-04 NOTE — Telephone Encounter (Signed)
Spoke to patient. Advised would send Rx. Pt unable to afford med. Plans to be over med cost financial hardship soon. Pt will come to p/u sample in the meantime.

## 2017-11-12 DIAGNOSIS — Z01419 Encounter for gynecological examination (general) (routine) without abnormal findings: Secondary | ICD-10-CM | POA: Diagnosis not present

## 2017-11-12 DIAGNOSIS — Z1231 Encounter for screening mammogram for malignant neoplasm of breast: Secondary | ICD-10-CM | POA: Diagnosis not present

## 2017-11-12 DIAGNOSIS — Z6828 Body mass index (BMI) 28.0-28.9, adult: Secondary | ICD-10-CM | POA: Diagnosis not present

## 2017-11-13 ENCOUNTER — Other Ambulatory Visit: Payer: Self-pay | Admitting: Obstetrics and Gynecology

## 2017-11-13 DIAGNOSIS — R921 Mammographic calcification found on diagnostic imaging of breast: Secondary | ICD-10-CM

## 2017-11-14 ENCOUNTER — Ambulatory Visit
Admission: RE | Admit: 2017-11-14 | Discharge: 2017-11-14 | Disposition: A | Discharge status: Home / Self Care | Payer: Medicare Other | Source: Ambulatory Visit | Attending: Obstetrics and Gynecology | Admitting: Obstetrics and Gynecology

## 2017-11-14 ENCOUNTER — Other Ambulatory Visit: Payer: Self-pay | Admitting: Obstetrics and Gynecology

## 2017-11-14 DIAGNOSIS — R921 Mammographic calcification found on diagnostic imaging of breast: Secondary | ICD-10-CM

## 2017-11-19 ENCOUNTER — Ambulatory Visit
Admission: RE | Admit: 2017-11-19 | Discharge: 2017-11-19 | Disposition: A | Discharge status: Home / Self Care | Payer: Medicare Other | Source: Ambulatory Visit | Attending: Obstetrics and Gynecology | Admitting: Obstetrics and Gynecology

## 2017-11-19 DIAGNOSIS — N6012 Diffuse cystic mastopathy of left breast: Secondary | ICD-10-CM | POA: Diagnosis not present

## 2017-11-19 DIAGNOSIS — R921 Mammographic calcification found on diagnostic imaging of breast: Secondary | ICD-10-CM

## 2017-12-06 DIAGNOSIS — M8588 Other specified disorders of bone density and structure, other site: Secondary | ICD-10-CM | POA: Diagnosis not present

## 2017-12-06 DIAGNOSIS — N958 Other specified menopausal and perimenopausal disorders: Secondary | ICD-10-CM | POA: Diagnosis not present

## 2017-12-25 ENCOUNTER — Other Ambulatory Visit: Payer: Self-pay | Admitting: Internal Medicine

## 2017-12-25 DIAGNOSIS — E118 Type 2 diabetes mellitus with unspecified complications: Secondary | ICD-10-CM

## 2017-12-25 DIAGNOSIS — Z794 Long term (current) use of insulin: Principal | ICD-10-CM

## 2018-01-10 ENCOUNTER — Encounter: Payer: Self-pay | Admitting: Internal Medicine

## 2018-01-10 ENCOUNTER — Ambulatory Visit (INDEPENDENT_AMBULATORY_CARE_PROVIDER_SITE_OTHER): Payer: Medicare Other | Admitting: Internal Medicine

## 2018-01-10 VITALS — BP 128/82 | HR 93 | Ht 62.0 in | Wt 156.2 lb

## 2018-01-10 DIAGNOSIS — E785 Hyperlipidemia, unspecified: Secondary | ICD-10-CM

## 2018-01-10 DIAGNOSIS — E1165 Type 2 diabetes mellitus with hyperglycemia: Secondary | ICD-10-CM

## 2018-01-10 DIAGNOSIS — Z794 Long term (current) use of insulin: Secondary | ICD-10-CM

## 2018-01-10 DIAGNOSIS — E010 Iodine-deficiency related diffuse (endemic) goiter: Secondary | ICD-10-CM | POA: Insufficient documentation

## 2018-01-10 DIAGNOSIS — E663 Overweight: Secondary | ICD-10-CM

## 2018-01-10 LAB — POCT GLYCOSYLATED HEMOGLOBIN (HGB A1C): HEMOGLOBIN A1C: 6.8 % — AB (ref 4.0–5.6)

## 2018-01-10 NOTE — Patient Instructions (Signed)
Please continue: - Metformin 1000 mg 2x a day with meals - Jardiance 10 mg daily before breakfast - Lantus 30 units at night - Trulicity 1.5 mg weekly  Please return in 3 months with your sugar log.

## 2018-01-10 NOTE — Progress Notes (Signed)
Patient ID: Margaret Norman, female   DOB: [birth year], 67 y.o.   MRN: [id number]   HPI: Margaret Norman is a 67 y.o.-year-old female, returning for f/u for DM2, dx in 2008, insulin-dependent since 2016, uncontrolled, without long term complications. Last visit 3 months ago  She still has a lot of stress at home with her grandson who is bipolar and who is living with her and her husband along with patient's daughter.  Last hemoglobin A1c was: Lab Results  Component Value Date   HGBA1C 8.7 08/13/2017   HGBA1C 7.5 (H) 05/02/2017   HGBA1C 10.5 (H) 12/31/2016  03/19/2017: HbA1c calculated from fructosamine is 6.7%! 12/31/2016: HbA1c calculated from fructosamine: 6.9%!  Pt is on: - Metformin 1000 mg 2x a day with meals - Jardiance 10 mg daily before breakfast - Lantus 30 units at night - Trulicity 1.5 mg weekly Stopped Actos when started trulicity back. She was on Trulicity but became expensive >> changed to Actos. She was on Invokana >> recurrent UTIs.  Pt checks her sugars 1X a day: - am:  99, 130-150 >> 98-122 >> 129 - 2h after b'fast: 140s >> n/c - before lunch: n/c >> 96-141 - 2h after lunch: n/c - before dinner: n/c - 2h after dinner: n/c - bedtime: 137 >> 200s >> 257 x1 >> 234 - nighttime: n/c >> 172 Lowest sugar was 99 >> 98 >> 96; she has hypoglycemia awareness in the 70s. Highest sugar was 200s >> 257 >> 234.  Glucometer: OneTouch Verio  Pt's meals are: - Breakfast: usually skip, Sun: scrambled eggs, bacon, bisquit and gravy; raisin brain cereal; bran muffins - Lunch: skips or salad - Dinner: frozen meal - Snacks: regular Dr. Malachi Bonds   -No CKD, last BUN/creatinine:  Lab Results  Component Value Date   BUN 20 10/09/2017   BUN 24 02/28/2017   CREATININE 0.96 10/09/2017   CREATININE 0.84 02/28/2017  On Benicar. -+ HL; last set of lipids: Lab Results  Component Value Date   CHOL 172 12/31/2016   HDL 53.40 12/31/2016   LDLCALC 99 12/31/2016   LDLDIRECT 137.9  02/04/2013   TRIG 95.0 12/31/2016   CHOLHDL 3 12/31/2016  She is not on a statin. - last eye exam was in 07/2017: No DR.  She does have cataracts >> needs sx on L eye - 01/2018.  Dr. Marica Otter. -No numbness and tingling in her feet.  She has restless leg syndrome. On ASA 81.  Pt has FH of DM in MGM.  ROS: Constitutional: + weight loss, no fatigue, no subjective hyperthermia, no subjective hypothermia Eyes: no blurry vision, no xerophthalmia ENT: no sore throat, no nodules palpated in throat, no dysphagia, no odynophagia, no hoarseness Cardiovascular: no CP/no SOB/no palpitations/no leg swelling Respiratory: no cough/no SOB/no wheezing Gastrointestinal: no N/no V/no D/no C/no acid reflux Musculoskeletal: no muscle aches/no joint aches Skin: no rashes, no hair loss Neurological: no tremors/no numbness/no tingling/no dizziness  I reviewed pt's medications, allergies, PMH, social hx, family hx, and changes were documented in the history of present illness. Otherwise, unchanged from my initial visit note.  Past Medical History:  Diagnosis Date  . Arthritis   . Chest pain    Nuclear, adenosine,  December, 2013, low risk nuclear scan with small, moderate in intensity, fixed anteroseptal defect. This is possibly related to an LBBB versus small prior infarct. No ischemia  . Depression   . Gallstones 11-06  . GERD (gastroesophageal reflux disease)   . HTN (hypertension)   .  Hyperlipidemia   . Kidney mass 01/28/2014  . LBBB (left bundle branch block)   . Nephrolithiasis   . Pneumonia   . 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 Type II or unspecified type diabetes mellitus without mention of complication, not stated as uncontrolled    Past Surgical History:  Procedure Laterality Date  . CESAREAN SECTION     x2 ? w/appy  . CHOLECYSTECTOMY    . ESOPHAGOGASTRODUODENOSCOPY    . HERNIA REPAIR    . KNEE ARTHROSCOPY Right 11/2005  . LIVER BIOPSY    . PANCREATIC CYST EXCISION    .  SPLENECTOMY    . TONSILLECTOMY AND ADENOIDECTOMY     Social History   Socioeconomic History  . Marital status: Married    Spouse name: Margaret Norman  . Number of children: 2  . Years of education: Not on file  . Highest education level: Not on file  Occupational History  . Occupation: CSR    Employer: [employer]  . Occupation: retired  Scientific laboratory technician  . Financial resource strain: Not on file  . Food insecurity:    Worry: Not on file    Inability: Not on file  . Transportation needs:    Medical: Not on file    Non-medical: Not on file  Tobacco Use  . Smoking status: Never Smoker  . Smokeless tobacco: Never Used  Substance and Sexual Activity  . Alcohol use: No  . Drug use: No  . Sexual activity: Not Currently  Lifestyle  . Physical activity:    Days per week: Not on file    Minutes per session: Not on file  . Stress: Not on file  Relationships  . Social connections:    Talks on phone: Not on file    Gets together: Not on file    Attends religious service: Not on file    Active member of club or organization: Not on file    Attends meetings of clubs or organizations: Not on file    Relationship status: Not on file  . Intimate partner violence:    Fear of current or ex partner: Not on file    Emotionally abused: Not on file    Physically abused: Not on file    Forced sexual activity: Not on file  Other Topics Concern  . Not on file  Social History Narrative   Regular Exercise -  NO   Current Outpatient Medications on File Prior to Visit  Medication Sig Dispense Refill  . aspirin 81 MG EC tablet Take 81 mg by mouth daily.      . Blood Glucose Monitoring Suppl (ONETOUCH VERIO FLEX SYSTEM) W/DEVICE KIT 1 kit by Does not apply route 3 (three) times daily. 1 kit 1  . buPROPion (WELLBUTRIN XL) 150 MG 24 hr tablet TAKE 1 TABLET BY MOUTH  EVERY MORNING 90 tablet 1  . Cyanocobalamin (VITAMIN B12 PO) Take 2,500 mcg by mouth daily.    Marland Kitchen docusate sodium (COLACE) 100  MG capsule Take 100 mg by mouth daily.    . empagliflozin (JARDIANCE) 10 MG TABS tablet Take 10 mg by mouth daily. 30 tablet 5  . glucose blood (ONETOUCH VERIO) test strip Test three times daily as directed. DX: E11.9 300 each 3  . Insulin Pen Needle (B-D ULTRAFINE III SHORT PEN) 31G X 8 MM MISC USE DAILY WITH LANTUS  INSULIN 90 each 1  . Insulin Pen Needle (CAREFINE PEN NEEDLES) 32G X 4 MM MISC Use 1x a  day 100 each 3  . LANTUS SOLOSTAR 100 UNIT/ML Solostar Pen INJECT SUBCUTANEOUSLY 30  UNITS AT BEDTIME 30 mL 3  . metFORMIN (GLUCOPHAGE) 1000 MG tablet TAKE 1 TABLET BY MOUTH TWO  TIMES DAILY WITH MEALS 180 tablet 1  . olmesartan (BENICAR) 40 MG tablet Take 1 tablet (40 mg total) by mouth daily. 90 tablet 3  . omeprazole (PRILOSEC) 40 MG capsule TAKE 1 CAPSULE BY MOUTH  DAILY 90 capsule 1  . ONE TOUCH LANCETS MISC Test three times daily as directed. DX: E11.9 300 each 3  . Pyridoxine HCl (VITAMIN B-6 PO) Take 1 tablet by mouth daily.     . sertraline (ZOLOFT) 50 MG tablet TAKE 1 TABLET BY MOUTH  DAILY 90 tablet 1  . TRULICITY 1.5 GE/3.6OQ SOPN INJECT THE CONTENTS OF 1  PEN SUBCUTANEOUSLY WEEKLY 2 mL 3   No current facility-administered medications on file prior to visit.    Allergies  Allergen Reactions  . Invokana [Canagliflozin] Other (See Comments)    UTI's  . Amoxicillin     Sore tongue   Family History  Problem Relation Age of Onset  . Liver disease Mother   . Dementia Mother   . Diabetes Mother        borderline  . Coronary artery disease Father   . Heart attack Father   . Hypertension Father   . Heart disease Father   . Cancer Other        leukemia  . Stroke Maternal Grandfather   . Hyperlipidemia Brother        Amyloidosis  . Diabetes Maternal Grandmother   . Cancer Paternal Uncle        unknown  . Heart attack Paternal Grandmother   . Heart attack Paternal Uncle   . Hypertension Brother   . Diabetes Daughter        borderline  . Colon cancer Neg Hx     PE: BP  128/82   Pulse 93   Ht 5' 2"  (1.575 m)   Wt 156 lb 3.2 oz (70.9 kg)   SpO2 96%   BMI 28.57 kg/m  Wt Readings from Last 3 Encounters:  01/10/18 156 lb 3.2 oz (70.9 kg)  10/09/17 159 lb 3.2 oz (72.2 kg)  09/17/17 164 lb 12.8 oz (74.8 kg)   ,Constitutional: overweight, in NAD Eyes: PERRLA, EOMI, no exophthalmos ENT: moist mucous membranes, + left prominent thyroid, no cervical lymphadenopathy Cardiovascular:  + Tachycardia, RR, No MRG Respiratory: CTA B Gastrointestinal: abdomen soft, NT, ND, BS+ Musculoskeletal: no deformities, strength intact in all 4 Skin: moist, warm, no rashes Neurological: no tremor with outstretched hands, DTR normal in all 4  ASSESSMENT: 1. DM2, insulin-dependent, uncontrolled, without long term complications, but with hyperglycemia  2.  Overweight  3. HL  4. L thyromegaly  PLAN:  1. Patient with long-standing, uncontrolled, type 2 diabetes, on oral antidiabetic regimen, basal insulin, and GLP-1 receptor agonist.  Trulicity is expensive for her, but since she is doing so well on it, she decided to continue.  At last visit, she was only checking sugars in the morning and these were at goal but only had one high blood sugar at bedtime.  I strongly advised her to start checking sugars later in the day also. - In the past, fructosamine levels were more accurate for her than directly measuring HbA1c - At this visit, she does not have any sugars in the downloaded meter reports, but the ones she checked are occasionally above  target, and she even had one high blood sugar in the 230s at bedtime.  She is not sure why this happened.  However, today, HbA1c is 6.8% (better), therefore, we will not make any changes in her regimen - I suggested to:  Patient Instructions  Please continue: - Metformin 1000 mg 2x a day with meals - Jardiance 10 mg daily before breakfast - Lantus 30 units at night - Trulicity 1.5 mg weekly  Please return in 3 months with your sugar log.    - continue checking sugars at different times of the day - check 1x a day, rotating checks - advised for yearly eye exams >> she is UTD - Return to clinic in 3 mo with sugar log     2.  Overweight -She is down 8 pounds in the last 4 months -Her weight started to improve after we started Trulicity and stopped Actos, and also after adding Jardiance.  We will continue these.    3. HL - Reviewed latest lipid panel from 12/2016: LDL greatly improved Lab Results  Component Value Date   CHOL 172 12/31/2016   HDL 53.40 12/31/2016   LDLCALC 99 12/31/2016   LDLDIRECT 137.9 02/04/2013   TRIG 95.0 12/31/2016   CHOLHDL 3 12/31/2016  - She is not on a statin   4. L thyromegaly - no neck compression sxs - will need to check a thyroid U/S at next visit  Philemon Kingdom, MD PhD [facility] Endocrinology

## 2018-01-10 NOTE — Addendum Note (Signed)
Addended by: Drucilla Schmidt on: 01/10/2018 11:53 AM   Modules accepted: Orders

## 2018-01-16 DIAGNOSIS — M1612 Unilateral primary osteoarthritis, left hip: Secondary | ICD-10-CM | POA: Diagnosis not present

## 2018-01-16 DIAGNOSIS — M1712 Unilateral primary osteoarthritis, left knee: Secondary | ICD-10-CM | POA: Diagnosis not present

## 2018-01-22 ENCOUNTER — Ambulatory Visit: Payer: Medicare Other

## 2018-01-23 ENCOUNTER — Ambulatory Visit: Payer: Medicare Other

## 2018-01-30 NOTE — Progress Notes (Addendum)
 Subjective:   Margaret Norman is a 66 y.o. female who presents for Medicare Annual (Subsequent) preventive examination.  Review of Systems:  No ROS.  Medicare Wellness Visit. Additional risk factors are reflected in the social history.    Sleep patterns: Has restless sleep, gets up 1 times nightly to void and sleeps 6 hours nightly.    Home Safety/Smoke Alarms: Feels safe in home. Smoke alarms in place.  Living environment; residence and Firearm Safety: 1-story house/ trailer, no firearms. Lives with family, no needs for DME, good support system Seat Belt Safety/Bike Helmet: Wears seat belt.     Objective:     Vitals: BP 126/74   Pulse 80   Temp 98.1 F (36.7 C)   Resp 18   Ht 5' 2" (1.575 m)   Wt 156 lb (70.8 kg)   SpO2 98%   BMI 28.53 kg/m   Body mass index is 28.53 kg/m.  Advanced Directives 01/31/2018 01/18/2017 04/27/2015  Does Patient Have a Medical Advance Directive? No No Yes  Type of Advance Directive - - Living will;Healthcare Power of Attorney  Does patient want to make changes to medical advance directive? - - No - Patient declined  Copy of Healthcare Power of Attorney in Chart? - - No - copy requested  Would patient like information on creating a medical advance directive? Yes (ED - Information included in AVS) Yes (ED - Information included in AVS) -    Tobacco Social History   Tobacco Use  Smoking Status Never Smoker  Smokeless Tobacco Never Used     Counseling given: Not Answered  Past Medical History:  Diagnosis Date  . Arthritis   . Chest pain    Nuclear, adenosine,  December, 2013, low risk nuclear scan with small, moderate in intensity, fixed anteroseptal defect. This is possibly related to an LBBB versus small prior infarct. No ischemia  . Depression   . Gallstones 11-06  . GERD (gastroesophageal reflux disease)   . HTN (hypertension)   . Hyperlipidemia   . Kidney mass 01/28/2014  . LBBB (left bundle branch block)   . Nephrolithiasis   .  Pneumonia   . 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 Type II or unspecified type diabetes mellitus without mention of complication, not stated as uncontrolled    Past Surgical History:  Procedure Laterality Date  . CESAREAN SECTION     x2 ? w/appy  . CHOLECYSTECTOMY    . ESOPHAGOGASTRODUODENOSCOPY    . HERNIA REPAIR    . KNEE ARTHROSCOPY Right 11/2005  . LIVER BIOPSY    . PANCREATIC CYST EXCISION    . SPLENECTOMY    . TONSILLECTOMY AND ADENOIDECTOMY     Family History  Problem Relation Age of Onset  . Liver disease Mother   . Dementia Mother   . Diabetes Mother        borderline  . Coronary artery disease Father   . Heart attack Father   . Hypertension Father   . Heart disease Father   . Cancer Other        leukemia  . Stroke Maternal Grandfather   . Hyperlipidemia Brother        Amyloidosis  . Diabetes Maternal Grandmother   . Cancer Paternal Uncle        unknown  . Heart attack Paternal Grandmother   . Heart attack Paternal Uncle   . Hypertension Brother   . Diabetes Daughter        borderline  .   Colon cancer Neg Hx    Social History   Socioeconomic History  . Marital status: Married    Spouse name: Mariamawit Depaoli  . Number of children: 2  . Years of education: Not on file  . Highest education level: Not on file  Occupational History  . Occupation: CSR    Employer: [employer]  . Occupation: retired  Scientific laboratory technician  . Financial resource strain: Not very hard  . Food insecurity:    Worry: Sometimes true    Inability: Sometimes true  . Transportation needs:    Medical: No    Non-medical: No  Tobacco Use  . Smoking status: Never Smoker  . Smokeless tobacco: Never Used  Substance and Sexual Activity  . Alcohol use: No  . Drug use: No  . Sexual activity: Not Currently  Lifestyle  . Physical activity:    Days per week: 0 days    Minutes per session: 0 min  . Stress: Only a little  Relationships  . Social connections:    Talks on phone:  More than three times a week    Gets together: More than three times a week    Attends religious service: Not on file    Active member of club or organization: Not on file    Attends meetings of clubs or organizations: Not on file    Relationship status: Not on file  Other Topics Concern  . Not on file  Social History Narrative   Regular Exercise -  NO    Outpatient Encounter Medications as of 01/31/2018  Medication Sig  . aspirin 81 MG EC tablet Take 81 mg by mouth daily.    . Blood Glucose Monitoring Suppl (ONETOUCH VERIO FLEX SYSTEM) W/DEVICE KIT 1 kit by Does not apply route 3 (three) times daily.  Marland Kitchen buPROPion (WELLBUTRIN XL) 150 MG 24 hr tablet TAKE 1 TABLET BY MOUTH  EVERY MORNING  . calcium carbonate (OSCAL) 1500 (600 Ca) MG TABS tablet Take 1,500 mg by mouth 2 (two) times daily with a meal.  . Cyanocobalamin (VITAMIN B12 PO) Take 2,500 mcg by mouth daily.  Marland Kitchen docusate sodium (COLACE) 100 MG capsule Take 100 mg by mouth daily.  . empagliflozin (JARDIANCE) 10 MG TABS tablet Take 10 mg by mouth daily.  Marland Kitchen glucose blood (ONETOUCH VERIO) test strip Test three times daily as directed. DX: E11.9  . Insulin Pen Needle (B-D ULTRAFINE III SHORT PEN) 31G X 8 MM MISC USE DAILY WITH LANTUS  INSULIN  . LANTUS SOLOSTAR 100 UNIT/ML Solostar Pen INJECT SUBCUTANEOUSLY 30  UNITS AT BEDTIME  . meloxicam (MOBIC) 15 MG tablet   . metFORMIN (GLUCOPHAGE) 1000 MG tablet TAKE 1 TABLET BY MOUTH TWO  TIMES DAILY WITH MEALS  . olmesartan (BENICAR) 40 MG tablet Take 1 tablet (40 mg total) by mouth daily.  Marland Kitchen omeprazole (PRILOSEC) 40 MG capsule TAKE 1 CAPSULE BY MOUTH  DAILY  . ONE TOUCH LANCETS MISC Test three times daily as directed. DX: E11.9  . Probiotic Product (PROBIOTIC ADVANCED PO) Take 1 tablet by mouth daily.  . Pyridoxine HCl (VITAMIN B-6 PO) Take 1 tablet by mouth daily.   . sertraline (ZOLOFT) 50 MG tablet TAKE 1 TABLET BY MOUTH  DAILY  . TRULICITY 1.5 VQ/0.0QQ SOPN INJECT THE CONTENTS OF 1  PEN  SUBCUTANEOUSLY WEEKLY  . Insulin Pen Needle (CAREFINE PEN NEEDLES) 32G X 4 MM MISC Use 1x a day   No facility-administered encounter medications on file as of 01/31/2018.  Activities of Daily Living No flowsheet data found.  Patient Care Team: Janith Lima, MD as PCP - General    Assessment:   This is a routine wellness examination for Neasia. Physical assessment deferred to PCP.   Exercise Activities and Dietary recommendations   Diet (meal preparation, eat out, water intake, caffeinated beverages, dairy products, fruits and vegetables): in general, a "healthy" diet  , on average, 1 meals per day . Reports poor appetite.   Reviewed heart healthy and diabetic diet. Encouraged patient to increase daily water and healthy fluid intake. Discussed drinking glucerna (samples and coupons provided)  Goals    . Do the best I can do with my diabetes     Decrease carbohydrates and sugar, exercise more, enjoy life and family.    . Patient Stated     Take some time daily to do something just for me only for me. Enjoy the upcoming beach trip, life and family.       Fall Risk Fall Risk  01/31/2018 01/18/2017 01/11/2017 08/25/2015 08/25/2013  Falls in the past year? No Yes Yes No No  Comment - - Emmi Telephone Survey: data to providers prior to load - -  Number falls in past yr: - 1 2 or more - -  Comment - - Emmi Telephone Survey Actual Response = 5 - -  Injury with Fall? - No No - -  Risk for fall due to : - Impaired balance/gait;Impaired mobility - - -  Follow up - Falls prevention discussed - - -   Depression Screen PHQ 2/9 Scores 01/31/2018 01/18/2017 08/25/2015 04/27/2015  PHQ - 2 Score _0 PHQ- 9 Score 15 3 - -     Cognitive Function       Ad8 score reviewed for issues:  Issues making decisions: no  Less interest in hobbies / activities: no  Repeats questions, stories (family complaining): no  Trouble using ordinary gadgets (microwave, computer, phone):no  Forgets the  month or year: no  Mismanaging finances: no  Remembering appts: no  Daily problems with thinking and/or memory: no Ad8 score is= 0  Immunization History  Administered Date(s) Administered  . Influenza Split 04/09/2011, 06/12/2012  . Influenza Whole 04/05/2009  . Influenza, High Dose Seasonal PF 04/30/2016, 03/12/2017  . Influenza,inj,Quad PF,6+ Mos 04/09/2013, 05/14/2014, 04/26/2015  . Meningococcal Polysaccharide 08/31/2008  . Pneumococcal Conjugate-13 09/13/2014  . Pneumococcal Polysaccharide-23 08/31/2008, 12/29/2015  . Td 03/26/1999, 10/18/2008  . Zoster 12/23/2014   Screening Tests Health Maintenance  Topic Date Due  . OPHTHALMOLOGY EXAM  03/27/2017  . FOOT EXAM  08/28/2017  . INFLUENZA VACCINE  01/23/2018  . HEMOGLOBIN A1C  07/13/2018  . TETANUS/TDAP  10/19/2018  . MAMMOGRAM  11/15/2019  . COLONOSCOPY  03/19/2024  . DEXA SCAN  Completed  . Hepatitis C Screening  Completed  . PNA vac Low Risk Adult  Completed      Plan:     I have personally reviewed and noted the following in the patient's chart:   . Medical and social history . Use of alcohol, tobacco or illicit drugs  . Current medications and supplements . Functional ability and status . Nutritional status . Physical activity . Advanced directives . List of other physicians . Vitals . Screenings to include cognitive, depression, and falls . Referrals and appointments  In addition, I have reviewed and discussed with patient certain preventive protocols, quality metrics, and best practice recommendations. A written personalized care plan for preventive services  as well as general preventive health recommendations were provided to patient.  Medical screening examination/treatment/procedure(s) were performed by non-physician practitioner and as supervising provider I was immediately available for consultation/collaboration.  I agree with above. [name] Murray, FNP       Jill A Wine,  RN  01/31/2018     

## 2018-01-31 ENCOUNTER — Ambulatory Visit (INDEPENDENT_AMBULATORY_CARE_PROVIDER_SITE_OTHER): Payer: Medicare Other | Admitting: *Deleted

## 2018-01-31 ENCOUNTER — Encounter: Payer: Self-pay | Admitting: Family

## 2018-01-31 ENCOUNTER — Ambulatory Visit (INDEPENDENT_AMBULATORY_CARE_PROVIDER_SITE_OTHER): Payer: Medicare Other | Admitting: Family

## 2018-01-31 VITALS — BP 126/74 | HR 80 | Temp 98.1°F | Resp 18 | Ht 62.0 in | Wt 156.0 lb

## 2018-01-31 DIAGNOSIS — E1165 Type 2 diabetes mellitus with hyperglycemia: Secondary | ICD-10-CM | POA: Diagnosis not present

## 2018-01-31 DIAGNOSIS — F32A Depression, unspecified: Secondary | ICD-10-CM

## 2018-01-31 DIAGNOSIS — Z794 Long term (current) use of insulin: Secondary | ICD-10-CM

## 2018-01-31 DIAGNOSIS — Z0000 Encounter for general adult medical examination without abnormal findings: Secondary | ICD-10-CM | POA: Diagnosis not present

## 2018-01-31 DIAGNOSIS — F329 Major depressive disorder, single episode, unspecified: Secondary | ICD-10-CM | POA: Diagnosis not present

## 2018-01-31 MED ORDER — SERTRALINE HCL 100 MG PO TABS: 100.0000 mg | ORAL_TABLET | Freq: Every day | ORAL | 0 refills | Status: DC

## 2018-01-31 MED ORDER — BUPROPION HCL ER (XL) 150 MG PO TB24: 150.0000 mg | ORAL_TABLET | Freq: Every morning | ORAL | 0 refills | Status: DC

## 2018-01-31 NOTE — Patient Instructions (Signed)
[email or web address]  [facility]

## 2018-01-31 NOTE — Progress Notes (Signed)
Margaret Norman is a 67 y.o. female with the following history as recorded in [employer]:  Patient Active Problem List   Diagnosis Date Noted  . Thyromegaly 01/10/2018  . Overweight (BMI 25.0-29.9) 08/13/2017  . Upper respiratory tract infection 07/30/2017  . Type 2 diabetes mellitus with hyperglycemia, with long-term current use of insulin ([place]) 03/19/2017  . Thiamine deficiency 01/04/2017  . 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 Deficiency anemia 04/30/2016  . Routine general medical examination at a health care facility 04/27/2015  . Abdominal wall hernia 04/26/2015  . Family history of hemochromatosis 09/13/2014  . Acute maxillary sinusitis 05/14/2014  . Personal history of colonic polyps 03/10/2014  . 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 Lumbosacral spondylosis without myelopathy 05/12/2013  . Insomnia, persistent 02/04/2013  . Obesity (BMI 30-39.9) 02/04/2013  . Unspecified asthma, with exacerbation 09/22/2012  . Visit for screening mammogram 07/04/2012  . Nephrolithiasis   . HTN (hypertension)   . Gallstones   . LBBB (left bundle branch block)   . Leukocytosis 12/20/2008  . Hyperlipidemia with target LDL less than 100 09/04/2007  . Chronic depression 09/04/2007  . GERD 09/04/2007    Current Outpatient Medications  Medication Sig Dispense Refill  . aspirin 81 MG EC tablet Take 81 mg by mouth daily.      . Blood Glucose Monitoring Suppl (ONETOUCH VERIO FLEX SYSTEM) W/DEVICE KIT 1 kit by Does not apply route 3 (three) times daily. 1 kit 1  . buPROPion (WELLBUTRIN XL) 150 MG 24 hr tablet Take 1 tablet (150 mg total) by mouth every morning. 90 tablet 0  . calcium carbonate (OSCAL) 1500 (600 Ca) MG TABS tablet Take 1,500 mg by mouth 2 (two) times daily with a meal.    . Cyanocobalamin (VITAMIN B12 PO) Take 2,500 mcg by mouth daily.    Marland Kitchen docusate sodium (COLACE) 100 MG capsule Take 100 mg by mouth daily.    . empagliflozin (JARDIANCE) 10 MG TABS tablet Take 10 mg  by mouth daily. 30 tablet 5  . glucose blood (ONETOUCH VERIO) test strip Test three times daily as directed. DX: E11.9 300 each 3  . Insulin Pen Needle (B-D ULTRAFINE III SHORT PEN) 31G X 8 MM MISC USE DAILY WITH LANTUS  INSULIN 90 each 1  . LANTUS SOLOSTAR 100 UNIT/ML Solostar Pen INJECT SUBCUTANEOUSLY 30  UNITS AT BEDTIME 30 mL 3  . meloxicam (MOBIC) 15 MG tablet     . metFORMIN (GLUCOPHAGE) 1000 MG tablet TAKE 1 TABLET BY MOUTH TWO  TIMES DAILY WITH MEALS 180 tablet 1  . olmesartan (BENICAR) 40 MG tablet Take 1 tablet (40 mg total) by mouth daily. 90 tablet 3  . omeprazole (PRILOSEC) 40 MG capsule TAKE 1 CAPSULE BY MOUTH  DAILY 90 capsule 1  . ONE TOUCH LANCETS MISC Test three times daily as directed. DX: E11.9 300 each 3  . Probiotic Product (PROBIOTIC ADVANCED PO) Take 1 tablet by mouth daily.    . Pyridoxine HCl (VITAMIN B-6 PO) Take 1 tablet by mouth daily.     . TRULICITY 1.5 PF/7.9KW SOPN INJECT THE CONTENTS OF 1  PEN SUBCUTANEOUSLY WEEKLY 2 mL 3  . sertraline (ZOLOFT) 100 MG tablet Take 1 tablet (100 mg total) by mouth daily. 90 tablet 0   No current facility-administered medications for this visit.     Allergies: Invokana [canagliflozin] and Amoxicillin  Past Medical History:  Diagnosis Date  . Arthritis   . Chest pain    Nuclear, adenosine,  December, 2013,  low risk nuclear scan with small, moderate in intensity, fixed anteroseptal defect. This is possibly related to an LBBB versus small prior infarct. No ischemia  . Depression   . Gallstones 11-06  . GERD (gastroesophageal reflux disease)   . HTN (hypertension)   . Hyperlipidemia   . Kidney mass 01/28/2014  . LBBB (left bundle branch block)   . Nephrolithiasis   . Pneumonia   . 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 Type II or unspecified type diabetes mellitus without mention of complication, not stated as uncontrolled     Past Surgical History:  Procedure Laterality Date  . CESAREAN SECTION     x2 ? w/appy  .  CHOLECYSTECTOMY    . ESOPHAGOGASTRODUODENOSCOPY    . HERNIA REPAIR    . KNEE ARTHROSCOPY Right 11/2005  . LIVER BIOPSY    . PANCREATIC CYST EXCISION    . SPLENECTOMY    . TONSILLECTOMY AND ADENOIDECTOMY      Family History  Problem Relation Age of Onset  . Liver disease Mother   . Dementia Mother   . Diabetes Mother        borderline  . Coronary artery disease Father   . Heart attack Father   . Hypertension Father   . Heart disease Father   . Cancer Other        leukemia  . Stroke Maternal Grandfather   . Hyperlipidemia Brother        Amyloidosis  . Diabetes Maternal Grandmother   . Cancer Paternal Uncle        unknown  . Heart attack Paternal Grandmother   . Heart attack Paternal Uncle   . Hypertension Brother   . Diabetes Daughter        borderline  . Colon cancer Neg Hx     Social History   Tobacco Use  . Smoking status: Never Smoker  . Smokeless tobacco: Never Used  Substance Use Topics  . Alcohol use: No    Subjective:  Patient presents today with concerns for long-standing problems with concerns for chronic depression; thinks she has been struggling since 2004- at time of her mother's death; notes that having no ambition, no "get up and go." Feels like she is sleeping "okay." Responded well to Zoloft and Wellbutrin initially; admits not taking her medications daily; Retired in 2013- spends most of her day at home; not involved in many activities outside her home; does occasionally baby-sit grandchildren (ages 79 and 60); admits that family is 25 of stress- daughter and grandson live with her; sees her extended family weekly- brother and sister; Defers starting counseling at this time; not suicidal;   Objective:  Vitals:   01/31/18 1034  BP: 126/74  Pulse: 80  Resp: 18  Temp: 98.1 F (36.7 C)  TempSrc: Oral  SpO2: 98%  Weight: 156 lb (70.8 kg)  Height: _0  (1.575 m)    General: Well developed, well nourished, in no acute distress  Skin : Warm  and dry.  Head: Normocephalic and atraumatic  Lungs: Respirations unlabored; clear to auscultation bilaterally without wheeze, rales, rhonchi  CVS exam: normal rate and regular rhythm.  Neurologic: Alert and oriented; speech intact; face symmetrical; moves all extremities well; CNII-XII intact without focal deficit   Assessment:  1. 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- important to stay engaged/ find a  hobby; increase Zoloft to 100 mg ( she will take 75 mg x 1 week and then increase to 100); will also consider increasing Wellbutrin dosage at next OV; she defers counseling at this time.  Spent 30 minutes with patient; greater than 50% spent in counseling;   Follow-up in 1 month, sooner prn.   No follow-ups on file.  No orders of the defined types were placed in this encounter.   Requested Prescriptions   Signed Prescriptions Disp Refills  . sertraline (ZOLOFT) 100 MG tablet 90 tablet 0    Sig: Take 1 tablet (100 mg total) by mouth daily.  Marland Kitchen buPROPion (WELLBUTRIN XL) 150 MG 24 hr tablet 90 tablet 0    Sig: Take 1 tablet (150 mg total) by mouth every morning.

## 2018-01-31 NOTE — Patient Instructions (Addendum)
[email or web address] or down load app on smart phone  Aunt Berenice Primas website lists multiple social resources for individuals such as: food, health, money, house hold goods, transit, medical supplies, job training and legal services.  Continue doing brain stimulating activities (puzzles, reading, adult coloring books, staying active) to keep memory sharp.   Continue to eat heart healthy diet (full of fruits, vegetables, whole grains, lean protein, water--limit salt, fat, and sugar intake) and increase physical activity as tolerated.   Margaret Norman , Thank you for taking time to come for your Medicare Wellness Visit. I appreciate your ongoing commitment to your health goals. Please review the following plan we discussed and let me know if I can assist you in the future.   These are the goals we discussed: Goals    . Do the best I can do with my diabetes     Decrease carbohydrates and sugar, exercise more, enjoy life and family.    . Patient Stated     Take some time daily to do something just for me only for me. Enjoy the upcoming beach trip, life and family.       This is a list of the screening recommended for you and due dates:  Health Maintenance  Topic Date Due  . Eye exam for diabetics  03/27/2017  . Complete foot exam   08/28/2017  . Flu Shot  01/23/2018  . Hemoglobin A1C  07/13/2018  . Tetanus Vaccine  10/19/2018  . Mammogram  11/15/2019  . Colon Cancer Screening  03/19/2024  . DEXA scan (bone density measurement)  Completed  .  Hepatitis C: One time screening is recommended by Center for Disease Control  (CDC) for  adults born from [birth year] through 1966.   Completed  . Pneumonia vaccines  Completed  \Health Maintenance, Female Adopting a healthy lifestyle and getting preventive care can go a long way to promote health and wellness. Talk with your health care provider about what schedule of regular examinations is right for you. This is a good chance for you to check in with your  provider about disease prevention and staying healthy. In between checkups, there are plenty of things you can do on your own. Experts have done a lot of research about which lifestyle changes and preventive measures are most likely to keep you healthy. Ask your health care provider for more information. Weight and diet Eat a healthy diet  Be sure to include plenty of vegetables, fruits, low-fat dairy products, and lean protein.  Do not eat a lot of foods high in solid fats, added sugars, or salt.  Get regular exercise. This is one of the most important things you can do for your health. ? Most adults should exercise for at least 150 minutes each week. The exercise should increase your heart rate and make you sweat (moderate-intensity exercise). ? Most adults should also do strengthening exercises at least twice a week. This is in addition to the moderate-intensity exercise.  Maintain a healthy weight  Body mass index (BMI) is a measurement that can be used to identify possible weight problems. It estimates body fat based on height and weight. Your health care provider can help determine your BMI and help you achieve or maintain a healthy weight.  For females 83 years of age and older: ? A BMI below 18.5 is considered underweight. ? A BMI of 18.5 to 24.9 is normal. ? A BMI of 25 to 29.9 is considered overweight. ? A BMI of 30 and  above is considered obese.  Watch levels of cholesterol and blood lipids  You should start having your blood tested for lipids and cholesterol at 68 years of age, then have this test every 5 years.  You may need to have your cholesterol levels checked more often if: ? Your lipid or cholesterol levels are high. ? You are older than 68 years of age. ? You are at high risk for heart disease.  Cancer screening Lung Cancer  Lung cancer screening is recommended for adults 71-45 years old who are at high risk for lung cancer because of a history of smoking.  A  yearly low-dose CT scan of the lungs is recommended for people who: ? Currently smoke. ? Have quit within the past 15 years. ? Have at least a 30-pack-year history of smoking. A pack year is smoking an average of one pack of cigarettes a day for 1 year.  Yearly screening should continue until it has been 15 years since you quit.  Yearly screening should stop if you develop a health problem that would prevent you from having lung cancer treatment.  Breast Cancer  Practice breast self-awareness. This means understanding how your breasts normally appear and feel.  It also means doing regular breast self-exams. Let your health care provider know about any changes, no matter how small.  If you are in your 20s or 30s, you should have a clinical breast exam (CBE) by a health care provider every 1-3 years as part of a regular health exam.  If you are 64 or older, have a CBE every year. Also consider having a breast X-ray (mammogram) every year.  If you have a family history of breast cancer, talk to your health care provider about genetic screening.  If you are at high risk for breast cancer, talk to your health care provider about having an MRI and a mammogram every year.  Breast cancer gene (BRCA) assessment is recommended for women who have family members with BRCA-related cancers. BRCA-related cancers include: ? Breast. ? Ovarian. ? Tubal. ? Peritoneal cancers.  Results of the assessment will determine the need for genetic counseling and BRCA1 and BRCA2 testing.  Cervical Cancer Your health care provider may recommend that you be screened regularly for cancer of the pelvic organs (ovaries, uterus, and vagina). This screening involves a pelvic examination, including checking for microscopic changes to the surface of your cervix (Pap test). You may be encouraged to have this screening done every 3 years, beginning at age 5.  For women ages 24-65, health care providers may recommend  pelvic exams and Pap testing every 3 years, or they may recommend the Pap and pelvic exam, combined with testing for human papilloma virus (HPV), every 5 years. Some types of HPV increase your risk of cervical cancer. Testing for HPV may also be done on women of any age with unclear Pap test results.  Other health care providers may not recommend any screening for nonpregnant women who are considered low risk for pelvic cancer and who do not have symptoms. Ask your health care provider if a screening pelvic exam is right for you.  If you have had past treatment for cervical cancer or a condition that could lead to cancer, you need Pap tests and screening for cancer for at least 20 years after your treatment. If Pap tests have been discontinued, your risk factors (such as having a new sexual partner) need to be reassessed to determine if screening should resume. Some women  have medical problems that increase the chance of getting cervical cancer. In these cases, your health care provider may recommend more frequent screening and Pap tests.  Colorectal Cancer  This type of cancer can be detected and often prevented.  Routine colorectal cancer screening usually begins at 68 years of age and continues through 68 years of age.  Your health care provider may recommend screening at an earlier age if you have risk factors for colon cancer.  Your health care provider may also recommend using home test kits to check for hidden blood in the stool.  A small camera at the end of a tube can be used to examine your colon directly (sigmoidoscopy or colonoscopy). This is done to check for the earliest forms of colorectal cancer.  Routine screening usually begins at age 31.  Direct examination of the colon should be repeated every 5-10 years through 68 years of age. However, you may need to be screened more often if early forms of precancerous polyps or small growths are found.  Skin Cancer  Check your skin  from head to toe regularly.  Tell your health care provider about any new moles or changes in moles, especially if there is a change in a mole's shape or color.  Also tell your health care provider if you have a mole that is larger than the size of a pencil eraser.  Always use sunscreen. Apply sunscreen liberally and repeatedly throughout the day.  Protect yourself by wearing long sleeves, pants, a wide-brimmed hat, and sunglasses whenever you are outside.  Heart disease, diabetes, and high blood pressure  High blood pressure causes heart disease and increases the risk of stroke. High blood pressure is more likely to develop in: ? People who have blood pressure in the high end of the normal range (130-139/85-89 mm Hg). ? People who are overweight or obese. ? People who are African American.  If you are 64-16 years of age, have your blood pressure checked every 3-5 years. If you are 69 years of age or older, have your blood pressure checked every year. You should have your blood pressure measured twice-once when you are at a hospital or clinic, and once when you are not at a hospital or clinic. Record the average of the two measurements. To check your blood pressure when you are not at a hospital or clinic, you can use: ? An automated blood pressure machine at a pharmacy. ? A home blood pressure monitor.  If you are between 64 years and 59 years old, ask your health care provider if you should take aspirin to prevent strokes.  Have regular diabetes screenings. This involves taking a blood sample to check your fasting blood sugar level. ? If you are at a normal weight and have a low risk for diabetes, have this test once every three years after 68 years of age. ? If you are overweight and have a high risk for diabetes, consider being tested at a younger age or more often. Preventing infection Hepatitis B  If you have a higher risk for hepatitis B, you should be screened for this virus. You  are considered at high risk for hepatitis B if: ? You were born in a country where hepatitis B is common. Ask your health care provider which countries are considered high risk. ? Your parents were born in a high-risk country, and you have not been immunized against hepatitis B (hepatitis B vaccine). ? You have HIV or AIDS. ? You  use needles to inject street drugs. ? You live with someone who has hepatitis B. ? You have had sex with someone who has hepatitis B. ? You get hemodialysis treatment. ? You take certain medicines for conditions, including cancer, organ transplantation, and autoimmune conditions.  Hepatitis C  Blood testing is recommended for: ? Everyone born from [birth year] through 1966. ? Anyone with known risk factors for hepatitis C.  Sexually transmitted infections (STIs)  You should be screened for sexually transmitted infections (STIs) including gonorrhea and chlamydia if: ? You are sexually active and are younger than 68 years of age. ? You are older than 68 years of age and your health care provider tells you that you are at risk for this type of infection. ? Your sexual activity has changed since you were last screened and you are at an increased risk for chlamydia or gonorrhea. Ask your health care provider if you are at risk.  If you do not have HIV, but are at risk, it may be recommended that you take a prescription medicine daily to prevent HIV infection. This is called pre-exposure prophylaxis (PrEP). You are considered at risk if: ? You are sexually active and do not regularly use condoms or know the HIV status of your partner(s). ? You take drugs by injection. ? You are sexually active with a partner who has HIV.  Talk with your health care provider about whether you are at high risk of being infected with HIV. If you choose to begin PrEP, you should first be tested for HIV. You should then be tested every 3 months for as long as you are taking PrEP. Pregnancy  If  you are premenopausal and you may become pregnant, ask your health care provider about preconception counseling.  If you may become pregnant, take 400 to 800 micrograms (mcg) of folic acid every day.  If you want to prevent pregnancy, talk to your health care provider about birth control (contraception). Osteoporosis and menopause  Osteoporosis is a disease in which the bones lose minerals and strength with aging. This can result in serious bone fractures. Your risk for osteoporosis can be identified using a bone density scan.  If you are 68 years of age or older, or if you are at risk for osteoporosis and fractures, ask your health care provider if you should be screened.  Ask your health care provider whether you should take a calcium or vitamin D supplement to lower your risk for osteoporosis.  Menopause may have certain physical symptoms and risks.  Hormone replacement therapy may reduce some of these symptoms and risks. Talk to your health care provider about whether hormone replacement therapy is right for you. Follow these instructions at home:  Schedule regular health, dental, and eye exams.  Stay current with your immunizations.  Do not use any tobacco products including cigarettes, chewing tobacco, or electronic cigarettes.  If you are pregnant, do not drink alcohol.  If you are breastfeeding, limit how much and how often you drink alcohol.  Limit alcohol intake to no more than 1 drink per day for nonpregnant women. One drink equals 12 ounces of beer, 5 ounces of wine, or 1 ounces of hard liquor.  Do not use street drugs.  Do not share needles.  Ask your health care provider for help if you need support or information about quitting drugs.  Tell your health care provider if you often feel depressed.  Tell your health care provider if you have ever been  abused or do not feel safe at home. This information is not intended to replace advice given to you by your health  care provider. Make sure you discuss any questions you have with your health care provider. Document Released: 12/25/2010 Document Revised: 11/17/2015 Document Reviewed: 03/15/2015 Elsevier Interactive Patient Education  [employer].

## 2018-02-06 DIAGNOSIS — H52223 Regular astigmatism, bilateral: Secondary | ICD-10-CM | POA: Diagnosis not present

## 2018-02-06 DIAGNOSIS — H25813 Combined forms of age-related cataract, bilateral: Secondary | ICD-10-CM | POA: Diagnosis not present

## 2018-02-06 DIAGNOSIS — H5203 Hypermetropia, bilateral: Secondary | ICD-10-CM | POA: Diagnosis not present

## 2018-02-09 ENCOUNTER — Other Ambulatory Visit: Payer: Self-pay | Admitting: Internal Medicine

## 2018-02-09 DIAGNOSIS — K219 Gastro-esophageal reflux disease without esophagitis: Secondary | ICD-10-CM

## 2018-02-21 ENCOUNTER — Ambulatory Visit (INDEPENDENT_AMBULATORY_CARE_PROVIDER_SITE_OTHER): Payer: Medicare Other | Admitting: Cardiovascular Disease

## 2018-02-21 ENCOUNTER — Encounter: Payer: Self-pay | Admitting: Cardiovascular Disease

## 2018-02-21 VITALS — BP 120/80 | HR 79 | Ht 62.0 in | Wt 153.8 lb

## 2018-02-21 DIAGNOSIS — I10 Essential (primary) hypertension: Secondary | ICD-10-CM | POA: Diagnosis not present

## 2018-02-21 DIAGNOSIS — E782 Mixed hyperlipidemia: Secondary | ICD-10-CM

## 2018-02-21 DIAGNOSIS — I447 Left bundle-branch block, unspecified: Secondary | ICD-10-CM

## 2018-02-21 MED ORDER — ROSUVASTATIN CALCIUM 10 MG PO TABS: 10.0000 mg | ORAL_TABLET | Freq: Every day | ORAL | 3 refills | Status: DC

## 2018-02-21 NOTE — Patient Instructions (Addendum)
Medication Instructions:  Your physician has recommended you make the following change in your medication:   START Rosuvastatin (Crestor) 10 mg once daily   Labwork: Your physician recommends that you return for lab work in: 3 months You will need to FAST for this appointment - nothing to eat or drink after midnight the night before except water.    Testing/Procedures: None Ordered   Follow-Up: Your physician wants you to follow-up in: 1 year with Dr. Acie Fredrickson. You will receive a reminder letter in the mail two months in advance. If you don't receive a letter, please call our office to schedule the follow-up appointment.   If you need a refill on your cardiac medications before your next appointment, please call your pharmacy.   Thank you for choosing [facility]! Christen Bame, RN [phone]

## 2018-02-21 NOTE — Progress Notes (Signed)
Primary cardiologist - [name], previous Ron Parker patient   Problem List  1. Chest pain  2. LBBB 3. Diabetes Mellitus  4. Essential Hypertension 5. Hyperlipidemia  Previous notes.     Margaret Norman is seen today for initial visit .  Previous patient of Dr. Ron Parker. Seen with husband , Konrad Dolores  Has been having some ankle and leg swelling . BP had been higher.   Had been having lots of anxiety . Had stopped taking the Invokana  Was started on HCTZ 12.5 a day BP is better,  Has had some dizziness with that. Leg edema has resolved.   Has + family hx of heart disease Brother has HTN and LVH Father had an aortic issue.   Has been fatigued.  Has occasional PVC No CP .   Has some discomfort across her back  Does not get any regular exercise,   Gets short of breath with household chores.   12/22/2015: Margaret Norman is seen back today for follow-up visit. She was having some chest discomfort when we initially saw her several months ago.  She was on depleted at her initial visit. We stopped her HCTZ at that time. Stress Myoview study revealed no evidence of ischemia. Ejection fraction was 48% by nuclear study. By echocardiography, her EF was 50-55%. She does have grade 1 diastolic dysfunction.  Still having some issues with chest pain .  Worse with mental stress.   Aug. 16, 2018:    Margaret Norman is seen today  BP has been well control She has heard about the valsartan recall.   Aug. 30 , 2019   No CP or dyspnea.   Still exercising regularly .    Has normal LV function by echo in 2017 In  reviewing her labs, her LDL is 99.  She is diabetic.  Her LDL probably should be around 70.   Allergies  Allergen Reactions  . Invokana [Canagliflozin] Other (See Comments)    UTI's  . Amoxicillin     Sore tongue    Current Outpatient Medications  Medication Sig Dispense Refill  . aspirin 81 MG EC tablet Take 81 mg by mouth daily.      . Blood Glucose Monitoring Suppl (ONETOUCH VERIO FLEX  SYSTEM) W/DEVICE KIT 1 kit by Does not apply route 3 (three) times daily. 1 kit 1  . buPROPion (WELLBUTRIN XL) 150 MG 24 hr tablet Take 1 tablet (150 mg total) by mouth every morning. 90 tablet 0  . calcium carbonate (OSCAL) 1500 (600 Ca) MG TABS tablet Take 1,500 mg by mouth 2 (two) times daily with a meal.    . Cyanocobalamin (VITAMIN B12 PO) Take 2,500 mcg by mouth daily.    Marland Kitchen docusate sodium (COLACE) 100 MG capsule Take 100 mg by mouth daily.    . empagliflozin (JARDIANCE) 10 MG TABS tablet Take 10 mg by mouth daily. 30 tablet 5  . glucose blood (ONETOUCH VERIO) test strip Test three times daily as directed. DX: E11.9 300 each 3  . Insulin Pen Needle (B-D ULTRAFINE III SHORT PEN) 31G X 8 MM MISC USE DAILY WITH LANTUS  INSULIN 90 each 1  . LANTUS SOLOSTAR 100 UNIT/ML Solostar Pen INJECT SUBCUTANEOUSLY 30  UNITS AT BEDTIME 30 mL 3  . meloxicam (MOBIC) 15 MG tablet     . metFORMIN (GLUCOPHAGE) 1000 MG tablet TAKE 1 TABLET BY MOUTH TWO  TIMES DAILY WITH MEALS 180 tablet 1  . olmesartan (BENICAR) 40 MG tablet Take 1 tablet (40 mg  total) by mouth daily. 90 tablet 3  . omeprazole (PRILOSEC) 40 MG capsule TAKE 1 CAPSULE BY MOUTH  DAILY 90 capsule 1  . ONE TOUCH LANCETS MISC Test three times daily as directed. DX: E11.9 300 each 3  . Probiotic Product (PROBIOTIC ADVANCED PO) Take 1 tablet by mouth daily.    . Pyridoxine HCl (VITAMIN B-6 PO) Take 1 tablet by mouth daily.     . sertraline (ZOLOFT) 100 MG tablet Take 1 tablet (100 mg total) by mouth daily. 90 tablet 0  . TRULICITY 1.5 HF/0.2OV SOPN INJECT THE CONTENTS OF 1  PEN SUBCUTANEOUSLY WEEKLY 2 mL 3   No current facility-administered medications for this visit.     Social History   Socioeconomic History  . Marital status: Married    Spouse name: Tinna Kolker  . Number of children: 2  . Years of education: Not on file  . Highest education level: Not on file  Occupational History  . Occupation: CSR    Employer: [employer]  .  Occupation: retired  Scientific laboratory technician  . Financial resource strain: Not very hard  . Food insecurity:    Worry: Sometimes true    Inability: Sometimes true  . Transportation needs:    Medical: No    Non-medical: No  Tobacco Use  . Smoking status: Never Smoker  . Smokeless tobacco: Never Used  Substance and Sexual Activity  . Alcohol use: No  . Drug use: No  . Sexual activity: Not Currently  Lifestyle  . Physical activity:    Days per week: 0 days    Minutes per session: 0 min  . Stress: Only a little  Relationships  . Social connections:    Talks on phone: More than three times a week    Gets together: More than three times a week    Attends religious service: Not on file    Active member of club or organization: Not on file    Attends meetings of clubs or organizations: Not on file    Relationship status: Not on file  . Intimate partner violence:    Fear of current or ex partner: Not on file    Emotionally abused: Not on file    Physically abused: Not on file    Forced sexual activity: Not on file  Other Topics Concern  . Not on file  Social History Narrative   Regular Exercise -  NO    Family History  Problem Relation Age of Onset  . Liver disease Mother   . Dementia Mother   . Diabetes Mother        borderline  . Coronary artery disease Father   . Heart attack Father   . Hypertension Father   . Heart disease Father   . Cancer Other        leukemia  . Stroke Maternal Grandfather   . Hyperlipidemia Brother        Amyloidosis  . Diabetes Maternal Grandmother   . Cancer Paternal Uncle        unknown  . Heart attack Paternal Grandmother   . Heart attack Paternal Uncle   . Hypertension Brother   . Diabetes Daughter        borderline  . Colon cancer Neg Hx     Past Medical History:  Diagnosis Date  . Arthritis   . Chest pain    Nuclear, adenosine,  December, 2013, low risk nuclear scan with small, moderate in intensity, fixed anteroseptal defect.  This is  possibly related to an LBBB versus small prior infarct. No ischemia  . Depression   . Gallstones 11-06  . GERD (gastroesophageal reflux disease)   . HTN (hypertension)   . Hyperlipidemia   . Kidney mass 01/28/2014  . LBBB (left bundle branch block)   . Nephrolithiasis   . Pneumonia   . 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 Type II or unspecified type diabetes mellitus without mention of complication, not stated as uncontrolled     Past Surgical History:  Procedure Laterality Date  . CESAREAN SECTION     x2 ? w/appy  . CHOLECYSTECTOMY    . ESOPHAGOGASTRODUODENOSCOPY    . HERNIA REPAIR    . KNEE ARTHROSCOPY Right 11/2005  . LIVER BIOPSY    . PANCREATIC CYST EXCISION    . SPLENECTOMY    . TONSILLECTOMY AND ADENOIDECTOMY      Patient Active Problem List   Diagnosis Date Noted  . LBBB (left bundle branch block)     Priority: High  . Thyromegaly 01/10/2018  . Overweight (BMI 25.0-29.9) 08/13/2017  . Upper respiratory tract infection 07/30/2017  . Type 2 diabetes mellitus with hyperglycemia, with long-term current use of insulin ([place]) 03/19/2017  . Thiamine deficiency 01/04/2017  . 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 Deficiency anemia 04/30/2016  . Routine general medical examination at a health care facility 04/27/2015  . Abdominal wall hernia 04/26/2015  . Family history of hemochromatosis 09/13/2014  . Acute maxillary sinusitis 05/14/2014  . Personal history of colonic polyps 03/10/2014  . 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 Lumbosacral spondylosis without myelopathy 05/12/2013  . Insomnia, persistent 02/04/2013  . Obesity (BMI 30-39.9) 02/04/2013  . Unspecified asthma, with exacerbation 09/22/2012  . Visit for screening mammogram 07/04/2012  . Nephrolithiasis   . HTN (hypertension)   . Gallstones   . Leukocytosis 12/20/2008  . Hyperlipidemia with target LDL less than 100 09/04/2007  . Chronic depression 09/04/2007  . GERD 09/04/2007    ROS    Noted in current hx   Physical Exam: Blood pressure 120/80, pulse 79, height 5' 2"  (1.575 m), weight 153 lb 12.8 oz (69.8 kg).  GEN:  Well nourished, well developed in no acute distress HEENT: Normal NECK: No JVD; No carotid bruits LYMPHATICS: No lymphadenopathy CARDIAC: RRR  RESPIRATORY:  Clear to auscultation without rales, wheezing or rhonchi  ABDOMEN: Soft, non-tender, non-distended MUSCULOSKELETAL:  No edema; No deformity  SKIN: Warm and dry NEUROLOGIC:  Alert and oriented x 3   ECG :     February 21, 2018: Normal sinus rhythm at 79 beats minute.  Left bundle branch block.  No changes from previous EKG.  ASSESSMENT & PLAN  1. Chest pain  -she has not had any further episodes of chest discomfort.  2. LBBB -this remained stable.  Her left ventricular systolic function is normal by echo in 2017.  3. Diabetes Mellitus -she has diabetes.  Her glucose levels have been fairly well controlled.   4. Essential Hypertension -blood pressure is well controlled.  Continue current medications.  5. Hyperlipidemia - she has diabetes.  Her glucose levels have been fairly well controlled.  I think that her LDL should be around 70.  We will start her on rosuvastatin 10 mg a day.  We will check fasting lipids, basic metabolic profile and hepatic profile in 3 months.  Will see see her in 1 year   Mertie Moores, MD  02/21/2018 8:12 AM    [facility]  [address],  [address] [place], [place]  [address] Pager [phone] Phone: [phone]; Fax: [phone]

## 2018-03-04 ENCOUNTER — Telehealth: Payer: Self-pay | Admitting: Internal Medicine

## 2018-03-04 ENCOUNTER — Ambulatory Visit (INDEPENDENT_AMBULATORY_CARE_PROVIDER_SITE_OTHER): Payer: Medicare Other | Admitting: Family

## 2018-03-04 ENCOUNTER — Other Ambulatory Visit (INDEPENDENT_AMBULATORY_CARE_PROVIDER_SITE_OTHER): Payer: Medicare Other

## 2018-03-04 ENCOUNTER — Encounter: Payer: Self-pay | Admitting: Family

## 2018-03-04 VITALS — BP 116/82 | HR 77 | Temp 97.7°F | Ht 62.0 in | Wt 153.0 lb

## 2018-03-04 DIAGNOSIS — E785 Hyperlipidemia, unspecified: Secondary | ICD-10-CM | POA: Diagnosis not present

## 2018-03-04 DIAGNOSIS — D539 Nutritional anemia, unspecified: Secondary | ICD-10-CM | POA: Diagnosis not present

## 2018-03-04 DIAGNOSIS — M8580 Other specified disorders of bone density and structure, unspecified site: Secondary | ICD-10-CM

## 2018-03-04 DIAGNOSIS — Z23 Encounter for immunization: Secondary | ICD-10-CM

## 2018-03-04 DIAGNOSIS — I447 Left bundle-branch block, unspecified: Secondary | ICD-10-CM

## 2018-03-04 DIAGNOSIS — D75838 Other thrombocytosis: Secondary | ICD-10-CM

## 2018-03-04 DIAGNOSIS — E559 Vitamin D deficiency, unspecified: Secondary | ICD-10-CM

## 2018-03-04 DIAGNOSIS — Z9081 Acquired absence of spleen: Secondary | ICD-10-CM

## 2018-03-04 DIAGNOSIS — F339 Major depressive disorder, recurrent, unspecified: Secondary | ICD-10-CM | POA: Diagnosis not present

## 2018-03-04 DIAGNOSIS — R7989 Other specified abnormal findings of blood chemistry: Secondary | ICD-10-CM

## 2018-03-04 LAB — CBC WITH DIFFERENTIAL/PLATELET
Basophils Absolute: 0.2 10*3/uL — ABNORMAL HIGH (ref 0.0–0.1)
Basophils Relative: 1 % (ref 0.0–3.0)
Eosinophils Absolute: 0.4 10*3/uL (ref 0.0–0.7)
Eosinophils Relative: 2.3 % (ref 0.0–5.0)
HCT: 39.5 % (ref 36.0–46.0)
Hemoglobin: 12.6 g/dL (ref 12.0–15.0)
LYMPHS PCT: 35.8 % (ref 12.0–46.0)
Lymphs Abs: 6.2 10*3/uL — ABNORMAL HIGH (ref 0.7–4.0)
MCHC: 32 g/dL (ref 30.0–36.0)
MCV: 85.9 fl (ref 78.0–100.0)
MONO ABS: 1.5 10*3/uL — AB (ref 0.1–1.0)
Monocytes Relative: 8.8 % (ref 3.0–12.0)
NEUTROS ABS: 9 10*3/uL — AB (ref 1.4–7.7)
Neutrophils Relative %: 52.1 % (ref 43.0–77.0)
Platelets: 525 10*3/uL — ABNORMAL HIGH (ref 150.0–400.0)
RBC: 4.6 Mil/uL (ref 3.87–5.11)
RDW: 14.4 % (ref 11.5–15.5)
WBC: 17.3 10*3/uL — ABNORMAL HIGH (ref 4.0–10.5)

## 2018-03-04 LAB — FERRITIN: Ferritin: 47.7 ng/mL (ref 10.0–291.0)

## 2018-03-04 LAB — VITAMIN D 25 HYDROXY (VIT D DEFICIENCY, FRACTURES): VITD: 52.7 ng/mL (ref 30.00–100.00)

## 2018-03-04 LAB — VITAMIN B12: VITAMIN B 12: 610 pg/mL (ref 211–911)

## 2018-03-04 LAB — FOLATE: Folate: 9.4 ng/mL (ref >5.9)

## 2018-03-04 NOTE — Telephone Encounter (Signed)
Okay; thanks.

## 2018-03-04 NOTE — Telephone Encounter (Signed)
Yes, I am okay with that.

## 2018-03-04 NOTE — Progress Notes (Signed)
Margaret Norman is a 67 y.o. female with the following history as recorded in EpicCare:  Patient Active Problem List   Diagnosis Date Noted  . Thyromegaly 01/10/2018  . Overweight (BMI 25.0-29.9) 08/13/2017  . Upper respiratory tract infection 07/30/2017  . Type 2 diabetes mellitus with hyperglycemia, with long-term current use of insulin ([place]) 03/19/2017  . Thiamine deficiency 01/04/2017  . 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 Deficiency anemia 04/30/2016  . Routine general medical examination at a health care facility 04/27/2015  . Abdominal wall hernia 04/26/2015  . Family history of hemochromatosis 09/13/2014  . Acute maxillary sinusitis 05/14/2014  . Personal history of colonic polyps 03/10/2014  . 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 Lumbosacral spondylosis without myelopathy 05/12/2013  . Insomnia, persistent 02/04/2013  . Obesity (BMI 30-39.9) 02/04/2013  . Unspecified asthma, with exacerbation 09/22/2012  . Visit for screening mammogram 07/04/2012  . Nephrolithiasis   . HTN (hypertension)   . Gallstones   . LBBB (left bundle branch block)   . Leukocytosis 12/20/2008  . Hyperlipidemia with target LDL less than 100 09/04/2007  . Chronic depression 09/04/2007  . GERD 09/04/2007    Current Outpatient Medications  Medication Sig Dispense Refill  . aspirin 81 MG EC tablet Take 81 mg by mouth daily.      . Blood Glucose Monitoring Suppl (ONETOUCH VERIO FLEX SYSTEM) W/DEVICE KIT 1 kit by Does not apply route 3 (three) times daily. 1 kit 1  . buPROPion (WELLBUTRIN XL) 150 MG 24 hr tablet Take 1 tablet (150 mg total) by mouth every morning. 90 tablet 0  . calcium carbonate (OSCAL) 1500 (600 Ca) MG TABS tablet Take 1,500 mg by mouth 2 (two) times daily with a meal.    . Cyanocobalamin (VITAMIN B12 PO) Take 2,500 mcg by mouth daily.    Marland Kitchen docusate sodium (COLACE) 100 MG capsule Take 100 mg by mouth daily.    . empagliflozin (JARDIANCE) 10 MG TABS tablet Take 10 mg  by mouth daily. 30 tablet 5  . glucose blood (ONETOUCH VERIO) test strip Test three times daily as directed. DX: E11.9 300 each 3  . Insulin Pen Needle (B-D ULTRAFINE III SHORT PEN) 31G X 8 MM MISC USE DAILY WITH LANTUS  INSULIN 90 each 1  . LANTUS SOLOSTAR 100 UNIT/ML Solostar Pen INJECT SUBCUTANEOUSLY 30  UNITS AT BEDTIME 30 mL 3  . meloxicam (MOBIC) 15 MG tablet     . metFORMIN (GLUCOPHAGE) 1000 MG tablet TAKE 1 TABLET BY MOUTH TWO  TIMES DAILY WITH MEALS 180 tablet 1  . olmesartan (BENICAR) 40 MG tablet Take 1 tablet (40 mg total) by mouth daily. 90 tablet 3  . omeprazole (PRILOSEC) 40 MG capsule TAKE 1 CAPSULE BY MOUTH  DAILY 90 capsule 1  . ONE TOUCH LANCETS MISC Test three times daily as directed. DX: E11.9 300 each 3  . Probiotic Product (PROBIOTIC ADVANCED PO) Take 1 tablet by mouth daily.    . Pyridoxine HCl (VITAMIN B-6 PO) Take 1 tablet by mouth daily.     . rosuvastatin (CRESTOR) 10 MG tablet Take 1 tablet (10 mg total) by mouth daily. 90 tablet 3  . sertraline (ZOLOFT) 100 MG tablet Take 1 tablet (100 mg total) by mouth daily. 90 tablet 0  . TRULICITY 1.5 SW/5.4OE SOPN INJECT THE CONTENTS OF 1  PEN SUBCUTANEOUSLY WEEKLY 2 mL 3   No current facility-administered medications for this visit.     Allergies: Invokana [canagliflozin] and Amoxicillin  Past Medical History:  Diagnosis Date  . Arthritis   . Chest pain    Nuclear, adenosine,  December, 2013, low risk nuclear scan with small, moderate in intensity, fixed anteroseptal defect. This is possibly related to an LBBB versus small prior infarct. No ischemia  . Depression   . Gallstones 11-06  . GERD (gastroesophageal reflux disease)   . HTN (hypertension)   . Hyperlipidemia   . Kidney mass 01/28/2014  . LBBB (left bundle branch block)   . Nephrolithiasis   . Pneumonia   . 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 Type II or unspecified type diabetes mellitus without mention of complication, not stated as uncontrolled      Past Surgical History:  Procedure Laterality Date  . CESAREAN SECTION     x2 ? w/appy  . CHOLECYSTECTOMY    . ESOPHAGOGASTRODUODENOSCOPY    . HERNIA REPAIR    . KNEE ARTHROSCOPY Right 11/2005  . LIVER BIOPSY    . PANCREATIC CYST EXCISION    . SPLENECTOMY    . TONSILLECTOMY AND ADENOIDECTOMY      Family History  Problem Relation Age of Onset  . Liver disease Mother   . Dementia Mother   . Diabetes Mother        borderline  . Coronary artery disease Father   . Heart attack Father   . Hypertension Father   . Heart disease Father   . Cancer Other        leukemia  . Stroke Maternal Grandfather   . Hyperlipidemia Brother        Amyloidosis  . Diabetes Maternal Grandmother   . Cancer Paternal Uncle        unknown  . Heart attack Paternal Grandmother   . Heart attack Paternal Uncle   . Hypertension Brother   . Diabetes Daughter        borderline  . Colon cancer Neg Hx     Social History   Tobacco Use  . Smoking status: Never Smoker  . Smokeless tobacco: Never Used  Substance Use Topics  . Alcohol use: No    Subjective:  Patient presents for one month follow-up on depression/ recent adjustment of Zoloft; notes she is feeling much better today; could tell improvement in her symptoms within 2 weeks of making the change; taking both Zoloft and Wellbutrin daily;  Planning to transfer her care form current PCP to me- notes she would prefer to work with female provider;  Does have GYN- sees regularly ( Dr. Julien Girt)- managing mammogram, DEXA for her; last DEXA in 2019; thinks osteopenia; Under care of cardiology for Left BBB; recently started Crestor; tolerating well; Under care of endocrine- last Hgba1c excellent at 6.8; will get copy of last eye exam note from [facility]; S/p splenectomy- previous PCP watching labs regularly; agreeable to getting updated today;     Objective:  Vitals:   03/04/18 0939  BP: 116/82  Pulse: 77  Temp: 97.7 F (36.5 C)  TempSrc: Oral   SpO2: 97%  Weight: 153 lb (69.4 kg)  Height: _0  (1.575 m)    General: Well developed, well nourished, in no acute distress  Skin : Warm and dry.  Head: Normocephalic and atraumatic  Lungs: Respirations unlabored; clear to auscultation bilaterally without wheeze, rales, rhonchi  CVS exam: normal rate and regular rhythm.  Neurologic: Alert and oriented; speech intact; face symmetrical; moves all extremities well; CNII-XII intact without focal deficit   Assessment:  1. Major depression, recurrent, chronic ([place])   2. Thrombocytosis  after splenectomy   3. Deficiency anemia   4. Osteopenia, unspecified location   5. Vitamin D deficiency   6. LBBB (left bundle branch block)   7. Hyperlipidemia with target LDL less than 100     Plan:  1. Improved; continue Zoloft and Wellbutrin; follow-up in 4 months; 2. & 3. Update labs as needed; 4. Check Vitamin D; DEXA in 2019 per GYN; 6. Under care of cardiology; 7. Recently started Crestor per cardiology- scheduled for follow-up there in November; Continue with endocrine for diabetes as well.  Flu shot given today;    Return in about 4 months (around 07/04/2018).  Orders Placed This Encounter  Procedures  . CBC w/Diff    Standing Status:   Future    Number of Occurrences:   1    Standing Expiration Date:   03/04/2019  . B12    Standing Status:   Future    Number of Occurrences:   1    Standing Expiration Date:   03/04/2019  . Folate    Standing Status:   Future    Number of Occurrences:   1    Standing Expiration Date:   03/04/2019  . Ferritin    Standing Status:   Future    Number of Occurrences:   1    Standing Expiration Date:   03/04/2019  . Vitamin B1    Standing Status:   Future    Number of Occurrences:   1    Standing Expiration Date:   03/05/2019  . Vitamin D (25 hydroxy)    Standing Status:   Future    Number of Occurrences:   1    Standing Expiration Date:   03/04/2019    Requested Prescriptions    No prescriptions  requested or ordered in this encounter

## 2018-03-04 NOTE — Telephone Encounter (Signed)
Pt would like to transfer from [facility] to [facility].  Please advise

## 2018-03-04 NOTE — Telephone Encounter (Signed)
LVM informing patient.

## 2018-03-06 ENCOUNTER — Other Ambulatory Visit: Payer: Self-pay | Admitting: Internal Medicine

## 2018-03-06 ENCOUNTER — Other Ambulatory Visit: Payer: Self-pay | Admitting: Family

## 2018-03-06 DIAGNOSIS — R350 Frequency of micturition: Secondary | ICD-10-CM

## 2018-03-06 DIAGNOSIS — D72829 Elevated white blood cell count, unspecified: Secondary | ICD-10-CM

## 2018-03-07 ENCOUNTER — Other Ambulatory Visit (INDEPENDENT_AMBULATORY_CARE_PROVIDER_SITE_OTHER): Payer: Medicare Other

## 2018-03-07 DIAGNOSIS — D72829 Elevated white blood cell count, unspecified: Secondary | ICD-10-CM | POA: Diagnosis not present

## 2018-03-07 DIAGNOSIS — R350 Frequency of micturition: Secondary | ICD-10-CM | POA: Diagnosis not present

## 2018-03-07 LAB — CBC WITH DIFFERENTIAL/PLATELET
Basophils Absolute: 0.1 10*3/uL (ref 0.0–0.1)
Basophils Relative: 0.5 % (ref 0.0–3.0)
EOS ABS: 0.3 10*3/uL (ref 0.0–0.7)
Eosinophils Relative: 1.7 % (ref 0.0–5.0)
HCT: 38.8 % (ref 36.0–46.0)
Hemoglobin: 12.6 g/dL (ref 12.0–15.0)
LYMPHS ABS: 5.2 10*3/uL — AB (ref 0.7–4.0)
Lymphocytes Relative: 29 % (ref 12.0–46.0)
MCHC: 32.5 g/dL (ref 30.0–36.0)
MCV: 85.1 fl (ref 78.0–100.0)
MONO ABS: 1.8 10*3/uL — AB (ref 0.1–1.0)
Monocytes Relative: 10.3 % (ref 3.0–12.0)
NEUTROS PCT: 58.5 % (ref 43.0–77.0)
Neutro Abs: 10.4 10*3/uL — ABNORMAL HIGH (ref 1.4–7.7)
PLATELETS: 501 10*3/uL — AB (ref 150.0–400.0)
RBC: 4.55 Mil/uL (ref 3.87–5.11)
RDW: 14.7 % (ref 11.5–15.5)
WBC: 17.8 10*3/uL — ABNORMAL HIGH (ref 4.0–10.5)

## 2018-03-07 LAB — VITAMIN B1: Vitamin B1 (Thiamine): 22 nmol/L (ref 8–30)

## 2018-03-08 LAB — URINE CULTURE
MICRO NUMBER: 91100082
SPECIMEN QUALITY: ADEQUATE

## 2018-03-10 ENCOUNTER — Other Ambulatory Visit: Payer: Self-pay | Admitting: Family

## 2018-03-10 MED ORDER — METFORMIN HCL 1000 MG PO TABS: ORAL_TABLET | ORAL | 1 refills | Status: DC

## 2018-03-11 ENCOUNTER — Other Ambulatory Visit: Payer: Self-pay | Admitting: Family

## 2018-03-11 DIAGNOSIS — D72829 Elevated white blood cell count, unspecified: Secondary | ICD-10-CM

## 2018-03-12 ENCOUNTER — Other Ambulatory Visit: Payer: Self-pay

## 2018-03-12 ENCOUNTER — Telehealth: Payer: Self-pay | Admitting: Internal Medicine

## 2018-03-12 MED ORDER — DULAGLUTIDE 1.5 MG/0.5ML ~~LOC~~ SOAJ: SUBCUTANEOUS | 3 refills | Status: DC

## 2018-03-12 NOTE — Telephone Encounter (Signed)
TRULICITY 1.5 YN/1.8ZF SOPN  Patient is needing a refill sent into the pharmacy        [place], [address]

## 2018-03-12 NOTE — Telephone Encounter (Signed)
Done

## 2018-03-14 ENCOUNTER — Other Ambulatory Visit (INDEPENDENT_AMBULATORY_CARE_PROVIDER_SITE_OTHER): Payer: Medicare Other

## 2018-03-14 ENCOUNTER — Other Ambulatory Visit: Payer: Self-pay | Admitting: Family

## 2018-03-14 DIAGNOSIS — R7989 Other specified abnormal findings of blood chemistry: Secondary | ICD-10-CM

## 2018-03-14 DIAGNOSIS — D72829 Elevated white blood cell count, unspecified: Secondary | ICD-10-CM | POA: Diagnosis not present

## 2018-03-14 DIAGNOSIS — Z9081 Acquired absence of spleen: Secondary | ICD-10-CM

## 2018-03-14 DIAGNOSIS — D75838 Other thrombocytosis: Secondary | ICD-10-CM

## 2018-03-14 LAB — CBC WITH DIFFERENTIAL/PLATELET
Basophils Absolute: 0.1 10*3/uL (ref 0.0–0.1)
Basophils Relative: 0.4 % (ref 0.0–3.0)
Eosinophils Absolute: 0.3 10*3/uL (ref 0.0–0.7)
Eosinophils Relative: 1.9 % (ref 0.0–5.0)
HCT: 38.5 % (ref 36.0–46.0)
Hemoglobin: 12.4 g/dL (ref 12.0–15.0)
LYMPHS ABS: 6.1 10*3/uL — AB (ref 0.7–4.0)
Lymphocytes Relative: 34.8 % (ref 12.0–46.0)
MCHC: 32.4 g/dL (ref 30.0–36.0)
MCV: 85.1 fl (ref 78.0–100.0)
Monocytes Absolute: 1.5 10*3/uL — ABNORMAL HIGH (ref 0.1–1.0)
Monocytes Relative: 8.7 % (ref 3.0–12.0)
NEUTROS PCT: 54.2 % (ref 43.0–77.0)
Neutro Abs: 9.5 10*3/uL — ABNORMAL HIGH (ref 1.4–7.7)
Platelets: 513 10*3/uL — ABNORMAL HIGH (ref 150.0–400.0)
RBC: 4.52 Mil/uL (ref 3.87–5.11)
RDW: 14.6 % (ref 11.5–15.5)
WBC: 17.6 10*3/uL — ABNORMAL HIGH (ref 4.0–10.5)

## 2018-03-24 ENCOUNTER — Telehealth: Payer: Self-pay

## 2018-03-24 NOTE — Telephone Encounter (Signed)
Done

## 2018-03-24 NOTE — Telephone Encounter (Signed)
Referral was sent to the cancer center last week. Pt should be hearing from them within the next couple days.

## 2018-03-25 ENCOUNTER — Other Ambulatory Visit: Payer: Self-pay | Admitting: Cardiovascular Disease

## 2018-04-07 ENCOUNTER — Other Ambulatory Visit: Payer: Self-pay | Admitting: Internal Medicine

## 2018-04-09 DIAGNOSIS — M1712 Unilateral primary osteoarthritis, left knee: Secondary | ICD-10-CM | POA: Diagnosis not present

## 2018-04-09 DIAGNOSIS — M1612 Unilateral primary osteoarthritis, left hip: Secondary | ICD-10-CM | POA: Diagnosis not present

## 2018-04-10 ENCOUNTER — Encounter: Payer: Self-pay | Admitting: Hematology

## 2018-04-10 ENCOUNTER — Encounter: Payer: Self-pay | Admitting: Family

## 2018-04-10 ENCOUNTER — Telehealth: Payer: Self-pay | Admitting: Hematology

## 2018-04-10 NOTE — Telephone Encounter (Signed)
New referral received from Marvis Repress, FNP at [facility] for leukocytosis. Pt has been scheduled to see Dr. Irene Limbo on 10/23 at 10am. Pt aware to arrive 30 minutes early. Letter mailed.

## 2018-04-11 NOTE — Addendum Note (Signed)
Addended by: Marcina Millard on: 04/11/2018 09:39 AM   Modules accepted: Orders

## 2018-04-14 DIAGNOSIS — M1612 Unilateral primary osteoarthritis, left hip: Secondary | ICD-10-CM | POA: Diagnosis not present

## 2018-04-16 ENCOUNTER — Inpatient Hospital Stay: Payer: Medicare Other | Attending: Hematology | Admitting: Hematology

## 2018-04-16 ENCOUNTER — Encounter: Payer: Self-pay | Admitting: Hematology

## 2018-04-16 ENCOUNTER — Telehealth: Payer: Self-pay

## 2018-04-16 ENCOUNTER — Inpatient Hospital Stay: Payer: Medicare Other

## 2018-04-16 VITALS — BP 133/65 | HR 85 | Temp 98.2°F | Resp 18 | Ht 62.0 in | Wt 152.8 lb

## 2018-04-16 DIAGNOSIS — E119 Type 2 diabetes mellitus without complications: Secondary | ICD-10-CM | POA: Insufficient documentation

## 2018-04-16 DIAGNOSIS — D75839 Thrombocytosis, unspecified: Secondary | ICD-10-CM

## 2018-04-16 DIAGNOSIS — Z9081 Acquired absence of spleen: Secondary | ICD-10-CM | POA: Diagnosis not present

## 2018-04-16 DIAGNOSIS — D473 Essential (hemorrhagic) thrombocythemia: Secondary | ICD-10-CM | POA: Diagnosis not present

## 2018-04-16 DIAGNOSIS — D72829 Elevated white blood cell count, unspecified: Secondary | ICD-10-CM | POA: Diagnosis not present

## 2018-04-16 LAB — CMP (CANCER CENTER ONLY)
ALT: 15 U/L (ref 0–44)
AST: 15 U/L (ref 15–41)
Albumin: 4.1 g/dL (ref 3.5–5.0)
Alkaline Phosphatase: 65 U/L (ref 38–126)
Anion gap: 12 (ref 5–15)
BUN: 19 mg/dL (ref 8–23)
CHLORIDE: 107 mmol/L (ref 98–111)
CO2: 25 mmol/L (ref 22–32)
CREATININE: 0.89 mg/dL (ref 0.44–1.00)
Calcium: 9.9 mg/dL (ref 8.9–10.3)
GFR, Est AFR Am: >60 mL/min (ref >60)
GFR, Estimated: >60 mL/min (ref >60)
Glucose, Bld: 96 mg/dL (ref 70–99)
POTASSIUM: 4.2 mmol/L (ref 3.5–5.1)
Sodium: 144 mmol/L (ref 135–145)
Total Bilirubin: 0.4 mg/dL (ref 0.3–1.2)
Total Protein: 6.9 g/dL (ref 6.5–8.1)

## 2018-04-16 LAB — CBC WITH DIFFERENTIAL/PLATELET
ABS IMMATURE GRANULOCYTES: 0.05 10*3/uL (ref 0.00–0.07)
Basophils Absolute: 0.1 10*3/uL (ref 0.0–0.1)
Basophils Relative: 1 %
Eosinophils Absolute: 0.3 10*3/uL (ref 0.0–0.5)
Eosinophils Relative: 2 %
HCT: 37.8 % (ref 36.0–46.0)
Hemoglobin: 12.3 g/dL (ref 12.0–15.0)
Immature Granulocytes: 0 %
LYMPHS ABS: 6 10*3/uL — AB (ref 0.7–4.0)
LYMPHS PCT: 36 %
MCH: 28.5 pg (ref 26.0–34.0)
MCHC: 32.5 g/dL (ref 30.0–36.0)
MCV: 87.7 fL (ref 80.0–100.0)
MONO ABS: 1.5 10*3/uL — AB (ref 0.1–1.0)
Monocytes Relative: 9 %
NEUTROS ABS: 8.6 10*3/uL — AB (ref 1.7–7.7)
Neutrophils Relative %: 52 %
Platelets: 501 10*3/uL — ABNORMAL HIGH (ref 150–400)
RBC: 4.31 MIL/uL (ref 3.87–5.11)
RDW: 14.6 % (ref 11.5–15.5)
WBC: 16.5 10*3/uL — AB (ref 4.0–10.5)
nRBC: 0 % (ref 0.0–0.2)

## 2018-04-16 NOTE — Progress Notes (Signed)
HEMATOLOGY/ONCOLOGY CONSULTATION NOTE  Date of Service: 04/16/2018  Patient Care Team: [name], [place] as PCP - General (Internal Medicine) [name], [name], MD as PCP - Cardiology (Cardiology)  CHIEF COMPLAINTS/PURPOSE OF CONSULTATION:  Leukocytosis   HISTORY OF PRESENTING ILLNESS:   MARITSSA Norman is a wonderful 67 y.o. female who has been referred to [place] by [name], NP for evaluation and management of Leukocytosis. The pt reports that she is doing well overall.   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 The pt notes that her PLT and WBC have been high for several years. She notes that she has constant sinus infections related to her allergies. The pt takes probiotics every night.  The pt notes that she has not had any fevers, chills, night sweats or unexpected weight loss. She notes that she was taken off Crestor about two months ago. The pt adds that she feels the same recently as compared to 6 months to a year ago.   The pt notes that her DM has been stable. She denies any thyroid problems.   The pt notes that she was released from urology for a concern for a kidney mass which was watched over several years and determined to be not malignant.   Most recent lab results (03/14/18) of CBC w/diff is as follows: all values are WNL except for WBC at 17.6k, PLT at 513k, ANC at 9.5k, Lymphs abs at 6.1k, Monocytes abs at 1.5k.  On review of systems, pt reports stable arthritis pains, good energy levels, and denies fevers, chills, night sweats, unexpected weight loss, fatigue, abdominal pains, pain along the spine, leg swelling, and any other symptoms.   On PMHx the pt reports two cesarean sections, splenectomy, cholecystectomy, and denies history of blood clots.    MEDICAL HISTORY:  Past Medical History:  Diagnosis Date  . Arthritis   . Chest pain    Nuclear, adenosine,  December, 2013, low risk  nuclear scan with small, moderate in intensity, fixed anteroseptal defect. This is possibly related to an LBBB versus small prior infarct. No ischemia  . Depression   . Gallstones 11-06  . GERD (gastroesophageal reflux disease)   . HTN (hypertension)   . Hyperlipidemia   . Kidney mass 01/28/2014  . LBBB (left bundle branch block)   . Nephrolithiasis   . Pneumonia   . 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 Type II or unspecified type diabetes mellitus without mention of complication, not stated as uncontrolled     SURGICAL HISTORY: Past Surgical History:  Procedure Laterality Date  . CESAREAN SECTION     x2 ? w/appy  . CHOLECYSTECTOMY    . ESOPHAGOGASTRODUODENOSCOPY    . HERNIA REPAIR    . KNEE ARTHROSCOPY Right 11/2005  . LIVER BIOPSY    . PANCREATIC CYST EXCISION    . SPLENECTOMY    . TONSILLECTOMY AND ADENOIDECTOMY      SOCIAL HISTORY: Social History   Socioeconomic History  . Marital status: Married    Spouse name: Jamirah Zelaya  . Number of children: 2  . Years of education: Not on file  . Highest education level: Not on file  Occupational History  . Occupation: CSR    Employer: [employer]  . Occupation: retired  Scientific laboratory technician  . Financial resource strain: Not very hard  . Food insecurity:    Worry: Sometimes true    Inability: Sometimes true  .  Transportation needs:    Medical: No    Non-medical: No  Tobacco Use  . Smoking status: Never Smoker  . Smokeless tobacco: Never Used  Substance and Sexual Activity  . Alcohol use: No  . Drug use: No  . Sexual activity: Not Currently  Lifestyle  . Physical activity:    Days per week: 0 days    Minutes per session: 0 min  . Stress: Only a little  Relationships  . Social connections:    Talks on phone: More than three times a week    Gets together: More than three times a week    Attends religious service: Not on file    Active member of club or organization: Not on file    Attends meetings of  clubs or organizations: Not on file    Relationship status: Not on file  . Intimate partner violence:    Fear of current or ex partner: Not on file    Emotionally abused: Not on file    Physically abused: Not on file    Forced sexual activity: Not on file  Other Topics Concern  . Not on file  Social History Narrative   Regular Exercise -  NO    FAMILY HISTORY: Family History  Problem Relation Age of Onset  . Liver disease Mother   . Dementia Mother   . Diabetes Mother        borderline  . Coronary artery disease Father   . Heart attack Father   . Hypertension Father   . Heart disease Father   . Cancer Other        leukemia  . Stroke Maternal Grandfather   . Hyperlipidemia Brother        Amyloidosis  . Diabetes Maternal Grandmother   . Cancer Paternal Uncle        unknown  . Heart attack Paternal Grandmother   . Heart attack Paternal Uncle   . Hypertension Brother   . Diabetes Daughter        borderline  . Colon cancer Neg Hx     ALLERGIES:  is allergic to invokana [canagliflozin] and amoxicillin.  MEDICATIONS:  Current Outpatient Medications  Medication Sig Dispense Refill  . aspirin 81 MG EC tablet Take 81 mg by mouth daily.      . Blood Glucose Monitoring Suppl (ONETOUCH VERIO FLEX SYSTEM) W/DEVICE KIT 1 kit by Does not apply route 3 (three) times daily. 1 kit 1  . buPROPion (WELLBUTRIN XL) 150 MG 24 hr tablet Take 1 tablet (150 mg total) by mouth every morning. 90 tablet 0  . calcium carbonate (OSCAL) 1500 (600 Ca) MG TABS tablet Take 1,500 mg by mouth 2 (two) times daily with a meal.    . Cyanocobalamin (VITAMIN B12 PO) Take 2,500 mcg by mouth daily.    Marland Kitchen docusate sodium (COLACE) 100 MG capsule Take 100 mg by mouth daily.    . Dulaglutide (TRULICITY) 1.5 XT/0.2IO SOPN INJECT THE CONTENTS OF 1  PEN SUBCUTANEOUSLY WEEKLY 2 mL 3  . glucose blood (ONETOUCH VERIO) test strip Test three times daily as directed. DX: E11.9 300 each 3  . Insulin Pen Needle (B-D  ULTRAFINE III SHORT PEN) 31G X 8 MM MISC USE DAILY WITH LANTUS  INSULIN 90 each 1  . JARDIANCE 10 MG TABS tablet TAKE 1 TABLET BY MOUTH DAILY 30 tablet 1  . LANTUS SOLOSTAR 100 UNIT/ML Solostar Pen INJECT SUBCUTANEOUSLY 30  UNITS AT BEDTIME 30 mL 3  . meloxicam (  MOBIC) 15 MG tablet     . metFORMIN (GLUCOPHAGE) 1000 MG tablet TAKE 1 TABLET BY MOUTH TWO  TIMES DAILY WITH MEALS 180 tablet 1  . olmesartan (BENICAR) 40 MG tablet TAKE 1 TABLET BY MOUTH DAILY 90 tablet 3  . omeprazole (PRILOSEC) 40 MG capsule TAKE 1 CAPSULE BY MOUTH  DAILY 90 capsule 1  . ONE TOUCH LANCETS MISC Test three times daily as directed. DX: E11.9 300 each 3  . Probiotic Product (PROBIOTIC ADVANCED PO) Take 1 tablet by mouth daily.    . Pyridoxine HCl (VITAMIN B-6 PO) Take 1 tablet by mouth daily.     . rosuvastatin (CRESTOR) 10 MG tablet Take 1 tablet (10 mg total) by mouth daily. 90 tablet 3  . sertraline (ZOLOFT) 100 MG tablet Take 1 tablet (100 mg total) by mouth daily. 90 tablet 0   No current facility-administered medications for this visit.     REVIEW OF SYSTEMS:    10 Point review of Systems was done is negative except as noted above.  PHYSICAL EXAMINATION:  . Vitals:   04/16/18 1024  BP: 133/65  Pulse: 85  Resp: 18  Temp: 98.2 F (36.8 C)  SpO2: 98%   Filed Weights   04/16/18 1024  Weight: 152 lb 12.8 oz (69.3 kg)   .Body mass index is 27.95 kg/m.  GENERAL:alert, in no acute distress and comfortable SKIN: no acute rashes, no significant lesions EYES: conjunctiva are pink and non-injected, sclera anicteric OROPHARYNX: MMM, no exudates, no oropharyngeal erythema or ulceration NECK: supple, no JVD LYMPH:  no palpable lymphadenopathy in the cervical, axillary or inguinal regions LUNGS: clear to auscultation b/l with normal respiratory effort HEART: regular rate & rhythm ABDOMEN:  normoactive bowel sounds , non tender, not distended. Extremity: no pedal edema PSYCH: alert & oriented x 3 with  fluent speech NEURO: no focal motor/sensory deficits  LABORATORY DATA:  I have reviewed the data as listed  . CBC Latest Ref Rng & Units 03/14/2018 03/07/2018 03/04/2018  WBC 4.0 - 10.5 K/uL 17.6(H) 17.8(H) 17.3(H)  Hemoglobin 12.0 - 15.0 g/dL 12.4 12.6 12.6  Hematocrit 36.0 - 46.0 % 38.5 38.8 39.5  Platelets 150.0 - 400.0 K/uL 513.0(H) 501.0(H) 525.0(H)    . CMP Latest Ref Rng & Units 10/09/2017 02/28/2017 12/31/2016  Glucose 65 - 99 mg/dL 92 144(H) 109(H)  BUN 7 - 25 mg/dL 20 24 30(H)  Creatinine 0.50 - 0.99 mg/dL 0.96 0.84 1.07  Sodium 135 - 146 mmol/L 141 141 141  Potassium 3.5 - 5.3 mmol/L 4.4 4.9 4.4  Chloride 98 - 110 mmol/L 104 101 106  CO2 20 - 32 mmol/L _0 Calcium 8.6 - 10.4 mg/dL 10.0 10.1 9.6  Total Protein 6.0 - 8.3 g/dL - - 6.8  Total Bilirubin 0.2 - 1.2 mg/dL - - 0.4  Alkaline Phos 39 - 117 U/L - - 64  AST 0 - 37 U/L - - 17  ALT 0 - 35 U/L - - 17   01/08/14 Flow Cytometry:    RADIOGRAPHIC STUDIES: I have personally reviewed the radiological images as listed and agreed with the findings in the report. No results found.  ASSESSMENT & PLAN:  67 y.o. female with  1. Leukocytosis 2. Thrombocytosis Likely related to her post splenectomy status. PLAN:  -Discussed patient's most recent labs from 03/14/18, PLT elevated at 513k and WBC at 17.6k. ANC at 9.5k. Monocytes abs at 1.5k -Discussed that the pt has had a mix of elevated WBC elevation,  suggestive of a reactive process and is also consistent with a splenectomy -Discussed that her thrombocytosis and leukocytosis have been stable since at least 2015 which is reassuring  -Reviewed the 7/117/15 Flow cytometry which did not reveal a clonal process -Recommended that the pt stay up to date with her every-5 years pneumonia vaccines which she did last in 2016 and 2017, and ensure she has received post-splenectomy vaccinations  -Will order additional labs today- as noted below -Will see this pt back as  needed  . Orders Placed This Encounter  Procedures  . CBC with Differential/Platelet    Standing Status:   Future    Number of Occurrences:   1    Standing Expiration Date:   05/21/2019  . CMP ([place] only)    Standing Status:   Future    Number of Occurrences:   1    Standing Expiration Date:   04/17/2019  . Flow Cytometry    Lymphocytosis reactive vs clonal    Standing Status:   Future    Number of Occurrences:   1    Standing Expiration Date:   04/17/2019  . BCR ABL1 FISH ([facility])    Standing Status:   Future    Number of Occurrences:   1    Standing Expiration Date:   04/17/2019  . JAK2 (including [id number] and Exon 12), MPL, and CALR-Next Generation Sequencing    Standing Status:   Future    Number of Occurrences:   1    Standing Expiration Date:   04/16/2019   Labs today RTC with Dr Irene Limbo as needed based on labs    All of the patients questions were answered with apparent satisfaction. The patient knows to call the clinic with any problems, questions or concerns.  The total time spent in the appt was 40 minutes and more than 50% was on counseling and direct patient cares.    Sullivan Lone MD MS AAHIVMS [facility] Hematology/Oncology Physician [facility]  (Office):       [phone] (Work cell):  [phone] (Fax):           [phone]  04/16/2018 11:17 AM  I, Baldwin [place], am acting as a scribe for Dr. Irene Limbo  .I have reviewed the above documentation for accuracy and completeness, and I agree with the above. Brunetta Genera MD

## 2018-04-16 NOTE — Telephone Encounter (Signed)
Printed avs and RTC with Dr Irene Limbo as needed based on labs. Per 10/23 los

## 2018-04-21 LAB — FLOW CYTOMETRY

## 2018-04-25 ENCOUNTER — Ambulatory Visit: Payer: Medicare Other | Admitting: Internal Medicine

## 2018-05-07 DIAGNOSIS — M1612 Unilateral primary osteoarthritis, left hip: Secondary | ICD-10-CM | POA: Diagnosis not present

## 2018-05-07 LAB — BCR ABL1 FISH (GENPATH)

## 2018-05-07 LAB — JAK2 (INCLUDING V617F AND EXON 12), MPL,& CALR-NEXT GEN SEQ

## 2018-05-13 LAB — HM DIABETES EYE EXAM

## 2018-05-19 ENCOUNTER — Other Ambulatory Visit: Payer: Medicare Other | Admitting: *Deleted

## 2018-05-19 DIAGNOSIS — E782 Mixed hyperlipidemia: Secondary | ICD-10-CM

## 2018-05-19 DIAGNOSIS — I447 Left bundle-branch block, unspecified: Secondary | ICD-10-CM

## 2018-05-19 DIAGNOSIS — I10 Essential (primary) hypertension: Secondary | ICD-10-CM | POA: Diagnosis not present

## 2018-05-20 LAB — BASIC METABOLIC PANEL
BUN / CREAT RATIO: 20 (ref 12–28)
BUN: 19 mg/dL (ref 8–27)
CHLORIDE: 105 mmol/L (ref 96–106)
CO2: 18 mmol/L — ABNORMAL LOW (ref 20–29)
CREATININE: 0.97 mg/dL (ref 0.57–1.00)
Calcium: 10.6 mg/dL — ABNORMAL HIGH (ref 8.7–10.3)
GFR calc Af Amer: 70 mL/min/{1.73_m2} (ref >59)
GFR calc non Af Amer: 61 mL/min/{1.73_m2} (ref >59)
GLUCOSE: 178 mg/dL — AB (ref 65–99)
POTASSIUM: 4.7 mmol/L (ref 3.5–5.2)
SODIUM: 146 mmol/L — AB (ref 134–144)

## 2018-05-20 LAB — HEPATIC FUNCTION PANEL
ALK PHOS: 75 IU/L (ref 39–117)
ALT: 12 IU/L (ref 0–32)
AST: 10 IU/L (ref 0–40)
Albumin: 4.6 g/dL (ref 3.6–4.8)
Bilirubin Total: <0.2 mg/dL (ref 0.0–1.2)
Bilirubin, Direct: 0.07 mg/dL (ref 0.00–0.40)
TOTAL PROTEIN: 6.7 g/dL (ref 6.0–8.5)

## 2018-05-20 LAB — LIPID PANEL
CHOL/HDL RATIO: 2.4 ratio (ref 0.0–4.4)
Cholesterol, Total: 114 mg/dL (ref 100–199)
HDL: 48 mg/dL (ref >39)
LDL CALC: 41 mg/dL (ref 0–99)
Triglycerides: 126 mg/dL (ref 0–149)
VLDL CHOLESTEROL CAL: 25 mg/dL (ref 5–40)

## 2018-06-03 ENCOUNTER — Encounter: Payer: Self-pay | Admitting: Family

## 2018-06-03 NOTE — Progress Notes (Signed)
Outside notes received. Information abstracted. Notes sent to scan.  

## 2018-06-09 ENCOUNTER — Other Ambulatory Visit: Payer: Self-pay | Admitting: Family

## 2018-06-16 ENCOUNTER — Encounter: Payer: Self-pay | Admitting: Internal Medicine

## 2018-06-16 ENCOUNTER — Other Ambulatory Visit: Payer: Self-pay

## 2018-06-16 MED ORDER — EMPAGLIFLOZIN 10 MG PO TABS: 10.0000 mg | ORAL_TABLET | Freq: Every day | ORAL | 3 refills | Status: DC

## 2018-06-25 HISTORY — PX: BREAST LUMPECTOMY: SHX2

## 2018-07-07 ENCOUNTER — Ambulatory Visit (INDEPENDENT_AMBULATORY_CARE_PROVIDER_SITE_OTHER): Payer: Medicare Other | Admitting: Family

## 2018-07-07 ENCOUNTER — Encounter: Payer: Self-pay | Admitting: Family

## 2018-07-07 VITALS — BP 126/82 | HR 101 | Temp 98.3°F | Ht 62.0 in | Wt 150.1 lb

## 2018-07-07 DIAGNOSIS — J019 Acute sinusitis, unspecified: Secondary | ICD-10-CM | POA: Diagnosis not present

## 2018-07-07 DIAGNOSIS — F329 Major depressive disorder, single episode, unspecified: Secondary | ICD-10-CM

## 2018-07-07 DIAGNOSIS — F32A Depression, unspecified: Secondary | ICD-10-CM

## 2018-07-07 MED ORDER — SERTRALINE HCL 100 MG PO TABS: 100.0000 mg | ORAL_TABLET | Freq: Every day | ORAL | 2 refills | Status: DC

## 2018-07-07 MED ORDER — BUPROPION HCL ER (XL) 150 MG PO TB24: 150.0000 mg | ORAL_TABLET | Freq: Every morning | ORAL | 2 refills | Status: DC

## 2018-07-07 MED ORDER — FLUTICASONE PROPIONATE 50 MCG/ACT NA SUSP: 2.0000 | Freq: Every day | NASAL | 6 refills | Status: DC

## 2018-07-07 MED ORDER — CEFDINIR 300 MG PO CAPS: 300.0000 mg | ORAL_CAPSULE | Freq: Two times a day (BID) | ORAL | 0 refills | Status: DC

## 2018-07-07 NOTE — Progress Notes (Signed)
Margaret Norman is a 68 y.o. female with the following history as recorded in Epic[employer]:  Patient Active Problem List   Diagnosis Date Noted  . Thyromegaly 01/10/2018  . Overweight (BMI 25.0-29.9) 08/13/2017  . Upper respiratory tract infection 07/30/2017  . Type 2 diabetes mellitus with hyperglycemia, with long-term current use of insulin ([place]) 03/19/2017  . Thiamine deficiency 01/04/2017  . 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 Deficiency anemia 04/30/2016  . Routine general medical examination at a health care facility 04/27/2015  . Abdominal wall hernia 04/26/2015  . Family history of hemochromatosis 09/13/2014  . Acute maxillary sinusitis 05/14/2014  . Personal history of colonic polyps 03/10/2014  . 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 Lumbosacral spondylosis without myelopathy 05/12/2013  . Insomnia, persistent 02/04/2013  . Obesity (BMI 30-39.9) 02/04/2013  . Unspecified asthma, with exacerbation 09/22/2012  . Visit for screening mammogram 07/04/2012  . Nephrolithiasis   . HTN (hypertension)   . Gallstones   . LBBB (left bundle branch block)   . Leukocytosis 12/20/2008  . Hyperlipidemia with target LDL less than 100 09/04/2007  . Chronic depression 09/04/2007  . GERD 09/04/2007    Current Outpatient Medications  Medication Sig Dispense Refill  . aspirin 81 MG EC tablet Take 81 mg by mouth daily.      . Blood Glucose Monitoring Suppl (ONETOUCH VERIO FLEX SYSTEM) W/DEVICE KIT 1 kit by Does not apply route 3 (three) times daily. 1 kit 1  . buPROPion (WELLBUTRIN XL) 150 MG 24 hr tablet Take 1 tablet (150 mg total) by mouth every morning. 90 tablet 2  . calcium carbonate (OSCAL) 1500 (600 Ca) MG TABS tablet Take 1,500 mg by mouth 2 (two) times daily with a meal.    . Cyanocobalamin (VITAMIN B12 PO) Take 2,500 mcg by mouth daily.    Marland Kitchen docusate sodium (COLACE) 100 MG capsule Take 100 mg by mouth daily.    . Dulaglutide (TRULICITY) 1.5 GO/1.1XB SOPN INJECT THE  CONTENTS OF 1  PEN SUBCUTANEOUSLY WEEKLY 2 mL 3  . empagliflozin (JARDIANCE) 10 MG TABS tablet Take 10 mg by mouth daily. 30 tablet 3  . glucose blood (ONETOUCH VERIO) test strip Test three times daily as directed. DX: E11.9 300 each 3  . Insulin Pen Needle (B-D ULTRAFINE III SHORT PEN) 31G X 8 MM MISC USE DAILY WITH LANTUS  INSULIN 90 each 1  . LANTUS SOLOSTAR 100 UNIT/ML Solostar Pen INJECT SUBCUTANEOUSLY 30  UNITS AT BEDTIME 30 mL 3  . meloxicam (MOBIC) 15 MG tablet     . metFORMIN (GLUCOPHAGE) 1000 MG tablet TAKE 1 TABLET BY MOUTH TWO  TIMES DAILY WITH MEALS 180 tablet 1  . olmesartan (BENICAR) 40 MG tablet TAKE 1 TABLET BY MOUTH DAILY 90 tablet 3  . omeprazole (PRILOSEC) 40 MG capsule TAKE 1 CAPSULE BY MOUTH  DAILY 90 capsule 1  . ONE TOUCH LANCETS MISC Test three times daily as directed. DX: E11.9 300 each 3  . Probiotic Product (PROBIOTIC ADVANCED PO) Take 1 tablet by mouth daily.    . Pyridoxine HCl (VITAMIN B-6 PO) Take 1 tablet by mouth daily.     . rosuvastatin (CRESTOR) 10 MG tablet Take 1 tablet (10 mg total) by mouth daily. 90 tablet 3  . sertraline (ZOLOFT) 100 MG tablet Take 1 tablet (100 mg total) by mouth daily. 90 tablet 2  . cefdinir (OMNICEF) 300 MG capsule Take 1 capsule (300 mg total) by mouth 2 (two) times daily. 20 capsule 0  .  fluticasone (FLONASE) 50 MCG/ACT nasal spray Place 2 sprays into both nostrils daily. 16 g 6   No current facility-administered medications for this visit.     Allergies: Invokana [canagliflozin] and Amoxicillin  Past Medical History:  Diagnosis Date  . Arthritis   . Chest pain    Nuclear, adenosine,  December, 2013, low risk nuclear scan with small, moderate in intensity, fixed anteroseptal defect. This is possibly related to an LBBB versus small prior infarct. No ischemia  . Depression   . Gallstones 11-06  . GERD (gastroesophageal reflux disease)   . HTN (hypertension)   . Hyperlipidemia   . Kidney mass 01/28/2014  . LBBB (left bundle  branch block)   . Nephrolithiasis   . Pneumonia   . 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 Type II or unspecified type diabetes mellitus without mention of complication, not stated as uncontrolled     Past Surgical History:  Procedure Laterality Date  . CESAREAN SECTION     x2 ? w/appy  . CHOLECYSTECTOMY    . ESOPHAGOGASTRODUODENOSCOPY    . HERNIA REPAIR    . KNEE ARTHROSCOPY Right 11/2005  . LIVER BIOPSY    . PANCREATIC CYST EXCISION    . SPLENECTOMY    . TONSILLECTOMY AND ADENOIDECTOMY      Family History  Problem Relation Age of Onset  . Liver disease Mother   . Dementia Mother   . Diabetes Mother        borderline  . Coronary artery disease Father   . Heart attack Father   . Hypertension Father   . Heart disease Father   . Cancer Other        leukemia  . Stroke Maternal Grandfather   . Hyperlipidemia Brother        Amyloidosis  . Diabetes Maternal Grandmother   . Cancer Paternal Uncle        unknown  . Heart attack Paternal Grandmother   . Heart attack Paternal Uncle   . Hypertension Brother   . Diabetes Daughter        borderline  . Colon cancer Neg Hx     Social History   Tobacco Use  . Smoking status: Never Smoker  . Smokeless tobacco: Never Used  Substance Use Topics  . Alcohol use: No    Subjective:  4 month follow-up on Wellbutrin/ Zoloft- feels like medications doing very well; feels like current regimen is doing well for her; she has energy to get out of house/ taking active role in raising her grandchildren;  Working with orthopedist about chronic left hip pain- limited benefit with injection; suspects she will need to discuss hip replacement; scheduled to go back there next week;   Left sided nasal congestion x " weeks"-just can't clear up; prone to sinus infection; + thick, discolored mucus; using OTC Saline spray; no chest pain, no shortness of breath; + sinus headache; + fever; is prone to recurrent sinus infections.     Objective:   Vitals:   07/07/18 1003  BP: 126/82  Pulse: (!) 101  Temp: 98.3 F (36.8 C)  TempSrc: Oral  SpO2: 95%  Weight: 150 lb 1.3 oz (68.1 kg)  Height: 5' 2" (1.575 m)    General: Well developed, well nourished, in no acute distress  Skin : Warm and dry.  Head: Normocephalic and atraumatic  Eyes: Sclera and conjunctiva clear; pupils round and reactive to light; extraocular movements intact  Ears: External normal; canals clear; tympanic membranes congested Oropharynx: Pink,  supple. No suspicious lesions  Neck: Supple without thyromegaly, adenopathy  Lungs: Respirations unlabored; clear to auscultation bilaterally without wheeze, rales, rhonchi  CVS exam: normal rate and regular rhythm.  Neurologic: Alert and oriented; speech intact; face symmetrical; moves all extremities well- using cane today; CNII-XII intact without focal deficit   Assessment:  1. Chronic depression   2. Acute sinusitis, recurrence not specified, unspecified location     Plan:  1. Stable; doing very well on current regimen; refills updated as requested; 2. Rx for Omnicef 300 mg bid x 10 days, Flonase NS; increase fluids, rest and follow-up worse, no better.    Return in about 7 months (around 02/05/2019) for with me and Sharee Pimple for [place].  No orders of the defined types were placed in this encounter.   Requested Prescriptions   Signed Prescriptions Disp Refills  . buPROPion (WELLBUTRIN XL) 150 MG 24 hr tablet 90 tablet 2    Sig: Take 1 tablet (150 mg total) by mouth every morning.  . sertraline (ZOLOFT) 100 MG tablet 90 tablet 2    Sig: Take 1 tablet (100 mg total) by mouth daily.  . fluticasone (FLONASE) 50 MCG/ACT nasal spray 16 g 6    Sig: Place 2 sprays into both nostrils daily.  . cefdinir (OMNICEF) 300 MG capsule 20 capsule 0    Sig: Take 1 capsule (300 mg total) by mouth 2 (two) times daily.

## 2018-08-03 ENCOUNTER — Encounter: Payer: Self-pay | Admitting: Internal Medicine

## 2018-08-04 ENCOUNTER — Other Ambulatory Visit: Payer: Self-pay

## 2018-08-04 MED ORDER — DULAGLUTIDE 1.5 MG/0.5ML ~~LOC~~ SOAJ: SUBCUTANEOUS | 1 refills | Status: DC

## 2018-08-07 ENCOUNTER — Ambulatory Visit: Payer: Medicare Other | Admitting: Internal Medicine

## 2018-08-14 ENCOUNTER — Ambulatory Visit: Payer: Medicare Other | Admitting: Internal Medicine

## 2018-08-22 ENCOUNTER — Other Ambulatory Visit: Payer: Self-pay | Admitting: Internal Medicine

## 2018-08-22 DIAGNOSIS — K219 Gastro-esophageal reflux disease without esophagitis: Secondary | ICD-10-CM

## 2018-09-18 ENCOUNTER — Encounter: Payer: Self-pay | Admitting: Internal Medicine

## 2018-09-18 ENCOUNTER — Other Ambulatory Visit: Payer: Self-pay

## 2018-09-18 ENCOUNTER — Encounter (INDEPENDENT_AMBULATORY_CARE_PROVIDER_SITE_OTHER): Payer: Medicare Other | Admitting: Internal Medicine

## 2018-09-18 DIAGNOSIS — E1165 Type 2 diabetes mellitus with hyperglycemia: Secondary | ICD-10-CM

## 2018-09-18 DIAGNOSIS — Z794 Long term (current) use of insulin: Secondary | ICD-10-CM

## 2018-09-18 DIAGNOSIS — E785 Hyperlipidemia, unspecified: Secondary | ICD-10-CM

## 2018-09-18 NOTE — Telephone Encounter (Addendum)
Patient ID: Margaret Norman, female   DOB: 1950-11-04, 68 y.o.   MRN: [id number]   HPI: Margaret Norman is a 68 y.o.-year-old female, requesting a MyChart appointment for follow-up for DM2, dx in 2008, insulin-dependent since 2016, uncontrolled, without long term complications. Last visit 8 months ago.  At last visit, she had a lot of stress at home with her grandson who is bipolar and who is living with her and her husband along with patient's daughter.  Last hemoglobin A1c reviewed: Lab Results  Component Value Date   HGBA1C 6.8 (A) 01/10/2018   HGBA1C 8.7 08/13/2017   HGBA1C 7.5 (H) 05/02/2017  03/19/2017: HbA1c calculated from fructosamine is 6.7%! 12/31/2016: HbA1c calculated from fructosamine: 6.9%!  Pt is on: - Metformin 1000 mg 2x a day with meals - Jardiance 10 mg daily before breakfast - Lantus 30 units at night - Trulicity 1.5 mg weekly Stopped Actos when started trulicity back. She was on Trulicity but became expensive >> changed to Actos. She was on Invokana >> recurrent UTIs.  Pt checks her sugars once a day: - am:  99, 130-150 >> 98-122 >> 129 >> 112-201 - 2h after b'fast: 140s >> n/c - before lunch: n/c >> 96-141 >> 186 - 2h after lunch: n/c - before dinner: n/c - 2h after dinner: n/c - bedtime: 137 >> 200s >> 257 x1 >> 234 >> n/c - nighttime: n/c >> 172 Lowest sugar was 99 >> 98 >> 96 >> 112; she has hypoglycemia awareness in the 70s. Highest sugar was 200s >> 257 >> 234 >> 201.  Glucometer: OneTouch Verio  -No CKD, last BUN/creatinine:  Lab Results  Component Value Date   BUN 19 05/19/2018   BUN 19 04/16/2018   CREATININE 0.97 05/19/2018   CREATININE 0.89 04/16/2018  On olmesartan. -+ HL; last set of lipids: Lab Results  Component Value Date   CHOL 114 05/19/2018   HDL 48 05/19/2018   LDLCALC 41 05/19/2018   LDLDIRECT 137.9 02/04/2013   TRIG 126 05/19/2018   CHOLHDL 2.4 05/19/2018  On Crestor 10. - last eye exam was in 07/2017: No DR.  She  does have cataracts >> needs sx on L eye - 01/2018.  Dr. Marica Otter. - She denies numbness and tingling in her feet.  She has restless leg syndrome On ASA 81  Pt has FH of DM in MGM.  ROS: Constitutional: no weight gain/no weight loss, no fatigue, no subjective hyperthermia, no subjective hypothermia Eyes: no blurry vision, no xerophthalmia ENT: no sore throat, no nodules palpated in neck, no dysphagia, no odynophagia, no hoarseness Cardiovascular: no CP/no SOB/no palpitations/no leg swelling Respiratory: no cough/no SOB/no wheezing Gastrointestinal: no N/no V/no D/no C/no acid reflux Musculoskeletal: no muscle aches/no joint aches Skin: no rashes, no hair loss Neurological: no tremors/no numbness/no tingling/no dizziness  I reviewed pt's medications, allergies, PMH, social hx, family hx, and changes were documented in the history of present illness. Otherwise, unchanged from my initial visit note.  Past Medical History:  Diagnosis Date  . Arthritis   . Chest pain    Nuclear, adenosine,  December, 2013, low risk nuclear scan with small, moderate in intensity, fixed anteroseptal defect. This is possibly related to an LBBB versus small prior infarct. No ischemia  . Depression   . Gallstones 11-06  . GERD (gastroesophageal reflux disease)   . HTN (hypertension)   . Hyperlipidemia   . Kidney mass 01/28/2014  . LBBB (left bundle branch block)   . Nephrolithiasis   .  Pneumonia   . 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 Type II or unspecified type diabetes mellitus without mention of complication, not stated as uncontrolled    Past Surgical History:  Procedure Laterality Date  . CESAREAN SECTION     x2 ? w/appy  . CHOLECYSTECTOMY    . ESOPHAGOGASTRODUODENOSCOPY    . HERNIA REPAIR    . KNEE ARTHROSCOPY Right 11/2005  . LIVER BIOPSY    . PANCREATIC CYST EXCISION    . SPLENECTOMY    . TONSILLECTOMY AND ADENOIDECTOMY     Social History   Socioeconomic History  . Marital  status: Married    Spouse name: Breean Nannini  . Number of children: 2  . Years of education: Not on file  . Highest education level: Not on file  Occupational History  . Occupation: CSR    Employer: [employer]  . Occupation: retired  Scientific laboratory technician  . Financial resource strain: Not very hard  . Food insecurity:    Worry: Sometimes true    Inability: Sometimes true  . Transportation needs:    Medical: No    Non-medical: No  Tobacco Use  . Smoking status: Never Smoker  . Smokeless tobacco: Never Used  Substance and Sexual Activity  . Alcohol use: No  . Drug use: No  . Sexual activity: Not Currently  Lifestyle  . Physical activity:    Days per week: 0 days    Minutes per session: 0 min  . Stress: Only a little  Relationships  . Social connections:    Talks on phone: More than three times a week    Gets together: More than three times a week    Attends religious service: Not on file    Active member of club or organization: Not on file    Attends meetings of clubs or organizations: Not on file    Relationship status: Not on file  . Intimate partner violence:    Fear of current or ex partner: Not on file    Emotionally abused: Not on file    Physically abused: Not on file    Forced sexual activity: Not on file  Other Topics Concern  . Not on file  Social History Narrative   Regular Exercise -  NO   Current Outpatient Medications on File Prior to Visit  Medication Sig Dispense Refill  . aspirin 81 MG EC tablet Take 81 mg by mouth daily.      . Blood Glucose Monitoring Suppl (ONETOUCH VERIO FLEX SYSTEM) W/DEVICE KIT 1 kit by Does not apply route 3 (three) times daily. 1 kit 1  . buPROPion (WELLBUTRIN XL) 150 MG 24 hr tablet Take 1 tablet (150 mg total) by mouth every morning. 90 tablet 2  . calcium carbonate (OSCAL) 1500 (600 Ca) MG TABS tablet Take 1,500 mg by mouth 2 (two) times daily with a meal.    . cefdinir (OMNICEF) 300 MG capsule Take 1 capsule (300 mg  total) by mouth 2 (two) times daily. 20 capsule 0  . Cyanocobalamin (VITAMIN B12 PO) Take 2,500 mcg by mouth daily.    Marland Kitchen docusate sodium (COLACE) 100 MG capsule Take 100 mg by mouth daily.    . Dulaglutide (TRULICITY) 1.5 JQ/3.0SP SOPN INJECT THE CONTENTS OF 1  PEN SUBCUTANEOUSLY WEEKLY 12 pen 1  . empagliflozin (JARDIANCE) 10 MG TABS tablet Take 10 mg by mouth daily. 30 tablet 3  . fluticasone (FLONASE) 50 MCG/ACT nasal spray Place 2 sprays into both nostrils  daily. 16 g 6  . glucose blood (ONETOUCH VERIO) test strip Test three times daily as directed. DX: E11.9 300 each 3  . Insulin Pen Needle (B-D ULTRAFINE III SHORT PEN) 31G X 8 MM MISC USE DAILY WITH LANTUS  INSULIN 90 each 1  . LANTUS SOLOSTAR 100 UNIT/ML Solostar Pen INJECT SUBCUTANEOUSLY 30  UNITS AT BEDTIME 30 mL 3  . meloxicam (MOBIC) 15 MG tablet     . metFORMIN (GLUCOPHAGE) 1000 MG tablet TAKE 1 TABLET BY MOUTH TWO  TIMES DAILY WITH MEALS 180 tablet 1  . olmesartan (BENICAR) 40 MG tablet TAKE 1 TABLET BY MOUTH DAILY 90 tablet 3  . omeprazole (PRILOSEC) 40 MG capsule TAKE 1 CAPSULE BY MOUTH  DAILY 90 capsule 1  . ONE TOUCH LANCETS MISC Test three times daily as directed. DX: E11.9 300 each 3  . Probiotic Product (PROBIOTIC ADVANCED PO) Take 1 tablet by mouth daily.    . Pyridoxine HCl (VITAMIN B-6 PO) Take 1 tablet by mouth daily.     . rosuvastatin (CRESTOR) 10 MG tablet Take 1 tablet (10 mg total) by mouth daily. 90 tablet 3  . sertraline (ZOLOFT) 100 MG tablet Take 1 tablet (100 mg total) by mouth daily. 90 tablet 2   No current facility-administered medications on file prior to visit.    Allergies  Allergen Reactions  . Invokana [Canagliflozin] Other (See Comments)    UTI's  . Amoxicillin     Sore tongue   Family History  Problem Relation Age of Onset  . Liver disease Mother   . Dementia Mother   . Diabetes Mother        borderline  . Coronary artery disease Father   . Heart attack Father   . Hypertension Father    . Heart disease Father   . Cancer Other        leukemia  . Stroke Maternal Grandfather   . Hyperlipidemia Brother        Amyloidosis  . Diabetes Maternal Grandmother   . Cancer Paternal Uncle        unknown  . Heart attack Paternal Grandmother   . Heart attack Paternal Uncle   . Hypertension Brother   . Diabetes Daughter        borderline  . Colon cancer Neg Hx     PE: Constitutional:  in NAD  The physical exam was not performed (my chart visit).  ASSESSMENT: 1. DM2, insulin-dependent, uncontrolled, without long term complications, but with hyperglycemia  2. HL  PLAN:  1. Patient with longstanding, previously uncontrolled type 2 diabetes, on oral antidiabetic regimen, long-acting insulin, and GLP-1 receptor agonist.  At last visit sugars were at goal with 1 exception at 230.  Her HbA1c was better, at 6.8%.  We did not make changes in her regimen. - She now tells me that the sugars are higher >> she only checks in the morning and only has 1 blood sugar before lunch.  Sugars in the morning may increase to 201. -She tolerates her medications well, without side effects.  I will suggest that she increases Jardiance to 25 mg daily. - I suggested to:  Patient Instructions  Please continue: - Metformin 1000 mg 2x a day with meals - Lantus 30 units at night - Trulicity 1.5 mg weekly  Please increase: - Jardiance to 25 mg daily before breakfast  Please return in 3 months with your sugar log.   - We did not check and HbA1c today we will  check 1 at next visit - continue checking sugars at different times of the day - check 1x a day, rotating checks - advised for yearly eye exams >> she is UTD - Return to clinic in 3 mo with sugar log     2. HL - Reviewed latest lipid panel from 04/2018: all fractions at goal Lab Results  Component Value Date   CHOL 114 05/19/2018   HDL 48 05/19/2018   LDLCALC 41 05/19/2018   LDLDIRECT 137.9 02/04/2013   TRIG 126 05/19/2018   CHOLHDL 2.4  05/19/2018  - Continues the statin without side effects.  - time spent with the patient: 15 min  of which >50% was spent in obtaining information about her  diabetes, reviewing her previous labs, evaluations, and treatments, counseling her about her condition (please see the discussed topics above), and developing a plan to treat it.   Philemon Kingdom, MD PhD [facility] Endocrin[facility]

## 2018-09-19 ENCOUNTER — Ambulatory Visit: Payer: Medicare Other | Admitting: Internal Medicine

## 2018-09-23 ENCOUNTER — Other Ambulatory Visit: Payer: Self-pay | Admitting: Internal Medicine

## 2018-09-23 MED ORDER — EMPAGLIFLOZIN 25 MG PO TABS: 25.0000 mg | ORAL_TABLET | Freq: Every day | ORAL | 11 refills | Status: DC

## 2018-09-24 ENCOUNTER — Other Ambulatory Visit: Payer: Self-pay

## 2018-09-24 MED ORDER — METFORMIN HCL 1000 MG PO TABS: ORAL_TABLET | ORAL | 1 refills | Status: DC

## 2018-10-01 ENCOUNTER — Encounter: Payer: Self-pay | Admitting: Internal Medicine

## 2018-11-05 ENCOUNTER — Telehealth: Payer: Self-pay

## 2018-11-05 NOTE — Telephone Encounter (Signed)
LM for patient to call us and schedule a teleheath follow up.

## 2018-12-10 ENCOUNTER — Other Ambulatory Visit: Payer: Self-pay | Admitting: Internal Medicine

## 2018-12-15 ENCOUNTER — Encounter: Payer: Self-pay | Admitting: Internal Medicine

## 2018-12-16 NOTE — Telephone Encounter (Signed)
Scheduled for f/u tomorrow (12/17/18) @ 11am

## 2018-12-17 ENCOUNTER — Ambulatory Visit: Payer: Medicare Other | Admitting: Internal Medicine

## 2018-12-17 ENCOUNTER — Encounter: Payer: Self-pay | Admitting: Internal Medicine

## 2018-12-17 ENCOUNTER — Other Ambulatory Visit: Payer: Self-pay

## 2018-12-17 VITALS — BP 150/88 | HR 90 | Ht 62.0 in | Wt 152.0 lb

## 2018-12-17 DIAGNOSIS — Z794 Long term (current) use of insulin: Secondary | ICD-10-CM | POA: Diagnosis not present

## 2018-12-17 DIAGNOSIS — E785 Hyperlipidemia, unspecified: Secondary | ICD-10-CM

## 2018-12-17 DIAGNOSIS — E1165 Type 2 diabetes mellitus with hyperglycemia: Secondary | ICD-10-CM

## 2018-12-17 DIAGNOSIS — E663 Overweight: Secondary | ICD-10-CM

## 2018-12-17 LAB — POCT GLYCOSYLATED HEMOGLOBIN (HGB A1C): Hemoglobin A1C: 7.1 % — AB (ref 4.0–5.6)

## 2018-12-17 NOTE — Patient Instructions (Addendum)
Please continue: - Metformin 1000 mg 2x a day with meals - Lantus 30 units at night - Trulicity 1.5 mg weekly - Jardiance 25 mg daily before breakfast  Try to not snack between meals and also try to reduce fat and animal protein in the diet.  Please return in 3 months with your sugar log.   Please consider the following ways to cut down carbs and fat and increase fiber and micronutrients in your diet: - substitute whole grain for white bread or pasta - substitute brown rice for white rice - substitute 90-calorie flat bread pieces for slices of bread when possible - substitute sweet potatoes or yams for white potatoes - substitute humus for margarine - substitute tofu for cheese when possible - substitute almond or rice milk for regular milk (would not drink soy milk daily due to concern for soy estrogen influence on breast cancer risk) - substitute dark chocolate for other sweets when possible - substitute water - can add lemon or orange slices for taste - for diet sodas (artificial sweeteners will trick your body that you can eat sweets without getting calories and will lead you to overeating and weight gain in the long run) - do not skip breakfast or other meals (this will slow down the metabolism and will result in more weight gain over time)  - can try smoothies made from fruit and almond/rice milk in am instead of regular breakfast - can also try old-fashioned (not instant) oatmeal made with almond/rice milk in am - order the dressing on the side when eating salad at a restaurant (pour less than half of the dressing on the salad) - eat as little meat as possible - can try juicing, but should not forget that juicing will get rid of the fiber, so would alternate with eating raw veg./fruits or drinking smoothies - use as little oil as possible, even when using olive oil - can dress a salad with a mix of balsamic vinegar and lemon juice, for e.g. - use agave nectar, stevia sugar, or  regular sugar rather than artificial sweateners - steam or broil/roast veggies  - snack on veggies/fruit/nuts (unsalted, preferably) when possible, rather than processed foods - reduce or eliminate aspartame in diet (it is in diet sodas, chewing gum, etc) Read the labels!  Try to read Dr. Janene Harvey book: "Program for Reversing Diabetes" for other ideas for healthy eating.

## 2018-12-17 NOTE — Progress Notes (Signed)
Patient ID: Margaret Norman, female   DOB: February 08, 1951, 68 y.o.   MRN: [id number]   HPI: Margaret Norman is a 68 y.o.-year-old female, requesting a MyChart appointment for follow-up for DM2, dx in 2008, insulin-dependent since 2016, uncontrolled, without long term complications. Last visit 3 months ago, by [employer]  She continues to have lot of stress at home with her grandson who 6, is bipolar, and was laid off from work - who is living with her and her husband along with patient's daughter.  Latest HbA1c reviewed: Lab Results  Component Value Date   HGBA1C 6.8 (A) 01/10/2018   HGBA1C 8.7 08/13/2017   HGBA1C 7.5 (H) 05/02/2017  03/19/2017: HbA1c calculated from fructosamine is 6.7%! 12/31/2016: HbA1c calculated from fructosamine: 6.9%!  Pt is on: - Metformin 1000 mg 2x a day with meals - Lantus 30 units at night - Trulicity 1.5 mg weekly - Jardiance 25 mg daily before breakfast - no SEs Stopped Actos when started trulicity back. She was on Trulicity but became expensive >> changed to Actos. She was on Invokana >> recurrent UTIs.  Pt checks her sugars once a day: - am:  98-122 >> 129 >> 112-201 >> 115, 123, 138 - 2h after b'fast: 140s >> n/c >> >> 147-182 - before lunch: n/c >> 96-141 >> 186 >> 128-174 - 2h after lunch: n/c >> 186- 194 - before dinner: n/c - 2h after dinner: n/c - bedtime: 257 x1 >> 234 >> n/c - nighttime: n/c >> 172 >> 178 Lowest sugar was 96 >> 112 >> 115; she has hypoglycemia awareness in the 70s. Highest sugar was 234 >> 201 >> 182.  Glucometer: OneTouch Verio  -No CKD, last BUN/creatinine:  Lab Results  Component Value Date   BUN 19 05/19/2018   BUN 19 04/16/2018   CREATININE 0.97 05/19/2018   CREATININE 0.89 04/16/2018  On olmesartan. -+ HL ; last set of lipids: Lab Results  Component Value Date   CHOL 114 05/19/2018   HDL 48 05/19/2018   LDLCALC 41 05/19/2018   LDLDIRECT 137.9 02/04/2013   TRIG 126 05/19/2018   CHOLHDL 2.4 05/19/2018  On  Crestor 10. - last eye exam was in 06/2018: No DR.+ cataracts  - L eye.  Dr. Marica Norman. - no numbness and tingling in her feet.  She has restless leg syndrome.  Pt has FH of DM in [place].  ROS: Constitutional: no weight gain/no weight loss, no fatigue, no subjective hyperthermia, no subjective hypothermia Eyes: no blurry vision, no xerophthalmia ENT: no sore throat, no nodules palpated in neck, no dysphagia, no odynophagia, no hoarseness Cardiovascular: no CP/no SOB/no palpitations/no leg swelling Respiratory: no cough/no SOB/no wheezing Gastrointestinal: + occasional N/no V/no D/+ C/no acid reflux Musculoskeletal: no muscle aches/no joint aches Skin: no rashes, no hair loss Neurological: no tremors/no numbness/no tingling/no dizziness  I reviewed pt's medications, allergies, PMH, social hx, family hx, and changes were documented in the history of present illness. Otherwise, unchanged from my initial visit note.  Past Medical History:  Diagnosis Date  . Arthritis   . Chest pain    Nuclear, adenosine,  December, 2013, low risk nuclear scan with small, moderate in intensity, fixed anteroseptal defect. This is possibly related to an LBBB versus small prior infarct. No ischemia  . Depression   . Gallstones 11-06  . GERD (gastroesophageal reflux disease)   . HTN (hypertension)   . Hyperlipidemia   . Kidney mass 01/28/2014  . LBBB (left bundle branch block)   .  Nephrolithiasis   . Pneumonia   . 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 Type II or unspecified type diabetes mellitus without mention of complication, not stated as uncontrolled    Past Surgical History:  Procedure Laterality Date  . CESAREAN SECTION     x2 ? w/appy  . CHOLECYSTECTOMY    . ESOPHAGOGASTRODUODENOSCOPY    . HERNIA REPAIR    . KNEE ARTHROSCOPY Right 11/2005  . LIVER BIOPSY    . PANCREATIC CYST EXCISION    . SPLENECTOMY    . TONSILLECTOMY AND ADENOIDECTOMY     Social History   Socioeconomic History   . Marital status: Married    Spouse name: Margaret Norman  . Number of children: 2  . Years of education: Not on file  . Highest education level: Not on file  Occupational History  . Occupation: CSR    Employer: [employer]  . Occupation: retired  Scientific laboratory technician  . Financial resource strain: Not very hard  . Food insecurity    Worry: Sometimes true    Inability: Sometimes true  . Transportation needs    Medical: No    Non-medical: No  Tobacco Use  . Smoking status: Never Smoker  . Smokeless tobacco: Never Used  Substance and Sexual Activity  . Alcohol use: No  . Drug use: No  . Sexual activity: Not Currently  Lifestyle  . Physical activity    Days per week: 0 days    Minutes per session: 0 min  . Stress: Only a little  Relationships  . Social connections    Talks on phone: More than three times a week    Gets together: More than three times a week    Attends religious service: Not on file    Active member of club or organization: Not on file    Attends meetings of clubs or organizations: Not on file    Relationship status: Not on file  . Intimate partner violence    Fear of current or ex partner: Not on file    Emotionally abused: Not on file    Physically abused: Not on file    Forced sexual activity: Not on file  Other Topics Concern  . Not on file  Social History Narrative   Regular Exercise -  NO   Current Outpatient Medications on File Prior to Visit  Medication Sig Dispense Refill  . aspirin 81 MG EC tablet Take 81 mg by mouth daily.      . Blood Glucose Monitoring Suppl (ONETOUCH VERIO FLEX SYSTEM) W/DEVICE KIT 1 kit by Does not apply route 3 (three) times daily. 1 kit 1  . buPROPion (WELLBUTRIN XL) 150 MG 24 hr tablet Take 1 tablet (150 mg total) by mouth every morning. 90 tablet 2  . calcium carbonate (OSCAL) 1500 (600 Ca) MG TABS tablet Take 1,500 mg by mouth 2 (two) times daily with a meal.    . cefdinir (OMNICEF) 300 MG capsule Take 1 capsule (300  mg total) by mouth 2 (two) times daily. 20 capsule 0  . Cyanocobalamin (VITAMIN B12 PO) Take 2,500 mcg by mouth daily.    Marland Kitchen docusate sodium (COLACE) 100 MG capsule Take 100 mg by mouth daily.    . empagliflozin (JARDIANCE) 25 MG TABS tablet Take 25 mg by mouth daily. 30 tablet 11  . fluticasone (FLONASE) 50 MCG/ACT nasal spray Place 2 sprays into both nostrils daily. 16 g 6  . glucose blood (ONETOUCH VERIO) test strip Test three times  daily as directed. DX: E11.9 300 each 3  . Insulin Pen Needle (B-D ULTRAFINE III SHORT PEN) 31G X 8 MM MISC USE DAILY WITH LANTUS  INSULIN 90 each 1  . LANTUS SOLOSTAR 100 UNIT/ML Solostar Pen INJECT SUBCUTANEOUSLY 30  UNITS AT BEDTIME 30 mL 3  . meloxicam (MOBIC) 15 MG tablet     . metFORMIN (GLUCOPHAGE) 1000 MG tablet TAKE 1 TABLET BY MOUTH TWO  TIMES DAILY WITH MEALS 180 tablet 1  . olmesartan (BENICAR) 40 MG tablet TAKE 1 TABLET BY MOUTH DAILY 90 tablet 3  . omeprazole (PRILOSEC) 40 MG capsule TAKE 1 CAPSULE BY MOUTH  DAILY 90 capsule 1  . ONE TOUCH LANCETS MISC Test three times daily as directed. DX: E11.9 300 each 3  . Probiotic Product (PROBIOTIC ADVANCED PO) Take 1 tablet by mouth daily.    . Pyridoxine HCl (VITAMIN B-6 PO) Take 1 tablet by mouth daily.     . rosuvastatin (CRESTOR) 10 MG tablet Take 1 tablet (10 mg total) by mouth daily. 90 tablet 3  . sertraline (ZOLOFT) 100 MG tablet Take 1 tablet (100 mg total) by mouth daily. 90 tablet 2  . TRULICITY 1.5 MB/8.4YK SOPN INJECT THE CONTENTS OF 1  PEN SUBCUTANEOUSLY WEEKLY  AS DIRECTED 6 mL 2   No current facility-administered medications on file prior to visit.    Allergies  Allergen Reactions  . Invokana [Canagliflozin] Other (See Comments)    UTI's  . Amoxicillin     Sore tongue   Family History  Problem Relation Age of Onset  . Liver disease Mother   . Dementia Mother   . Diabetes Mother        borderline  . Coronary artery disease Father   . Heart attack Father   . Hypertension Father    . Heart disease Father   . Cancer Other        leukemia  . Stroke Maternal Grandfather   . Hyperlipidemia Brother        Amyloidosis  . Diabetes Maternal Grandmother   . Cancer Paternal Uncle        unknown  . Heart attack Paternal Grandmother   . Heart attack Paternal Uncle   . Hypertension Brother   . Diabetes Daughter        borderline  . Colon cancer Neg Hx     PE: BP (!) 150/88   Pulse 90   Ht 5' 2"  (1.575 m)   Wt 152 lb (68.9 kg)   SpO2 96%   BMI 27.80 kg/m   Wt Readings from Last 3 Encounters:  12/17/18 152 lb (68.9 kg)  07/07/18 150 lb 1.3 oz (68.1 kg)  04/16/18 152 lb 12.8 oz (69.3 kg)   Constitutional: overweight, in NAD Eyes: PERRLA, EOMI, no exophthalmos ENT: moist mucous membranes, no thyromegaly, no cervical lymphadenopathy Cardiovascular: RRR, No MRG Respiratory: CTA B Gastrointestinal: abdomen soft, NT, ND, BS+ Musculoskeletal: no deformities, strength intact in all 4 Skin: moist, warm, no rashes Neurological: no tremor with outstretched hands, DTR normal in all 4  ASSESSMENT: 1. DM2, insulin-dependent, uncontrolled, without long term complications, but with hyperglycemia  2. HL  3.  Overweight  PLAN:  1. Patient with longstanding, uncontrolled, type 2 diabetes, on oral antidiabetic regimen, long-acting insulin and GLP-1 receptor agonist.  Her latest HbA1c is from almost a year ago, and this was at goal, at 6.8%.  However, at last visit (this was a MyChart visit so no HbA1c was checked), sugars were  higher (up to 200).  We increased her Jardiance dose to 25 mg daily at that time.  As she was only checking sugars in the morning, I also advised her to start checking later in the day. -At this visit, sugars are at or slightly above goal and she tells me that she is aware that this is due to her frequent snacking and disorganized meals.  We discussed about stopping snacking between meals, and have more consistent mealtimes.  We also discussed about  changing to a healthier diet including whole foods, and processed, with a greater proportion, low fat foods including fruits, vegetables, whole grains.  She would like to try this.  For now, we will not change her medication regimen. - I suggested to:  Patient Instructions  Please continue: - Metformin 1000 mg 2x a day with meals - Lantus 30 units at night - Trulicity 1.5 mg weekly - Jardiance 25 mg daily before breakfast  Try to not snack between meals and also try to reduce fat and animal protein in the diet.  Please return in 3 months with your sugar log.   - today, HbA1c is 7.1% (higher) - continue checking sugars at different times of the day - check 1x a day, rotating checks - advised for yearly eye exams >> she is UTD - Return to clinic in 3 mo with sugar log      2. HL - Reviewed latest lipid panel from 04/2018: All fractions at goal Lab Results  Component Value Date   CHOL 114 05/19/2018   HDL 48 05/19/2018   LDLCALC 41 05/19/2018   LDLDIRECT 137.9 02/04/2013   TRIG 126 05/19/2018   CHOLHDL 2.4 05/19/2018  - Continues the statin without side effects.  3.  Overweight -No weight loss since last visit -Continue Trulicity and Jardiance, which should also help with weight loss -Changing her diet will also help  Philemon Kingdom, MD PhD [facility] Endocrinology

## 2019-01-03 ENCOUNTER — Other Ambulatory Visit: Payer: Self-pay | Admitting: Internal Medicine

## 2019-01-03 DIAGNOSIS — E118 Type 2 diabetes mellitus with unspecified complications: Secondary | ICD-10-CM

## 2019-01-20 ENCOUNTER — Encounter: Payer: Self-pay | Admitting: Internal Medicine

## 2019-01-21 ENCOUNTER — Other Ambulatory Visit: Payer: Self-pay

## 2019-01-21 ENCOUNTER — Other Ambulatory Visit: Payer: Self-pay | Admitting: Internal Medicine

## 2019-01-21 DIAGNOSIS — E118 Type 2 diabetes mellitus with unspecified complications: Secondary | ICD-10-CM

## 2019-01-21 DIAGNOSIS — Z794 Long term (current) use of insulin: Secondary | ICD-10-CM

## 2019-01-21 MED ORDER — BD PEN NEEDLE SHORT U/F 31G X 8 MM MISC: 1 refills | Status: DC

## 2019-01-21 MED ORDER — LANTUS SOLOSTAR 100 UNIT/ML ~~LOC~~ SOPN: PEN_INJECTOR | SUBCUTANEOUS | 3 refills | Status: DC

## 2019-01-28 NOTE — Progress Notes (Addendum)
Subjective:   Margaret Norman is a 68 y.o. female who presents for Medicare Annual (Subsequent) preventive examination.  Review of Systems:   Cardiac Risk Factors include: advanced age (>65mn, >>33women);diabetes mellitus Sleep patterns: does not get up to void and sleeps 7-8 hours nightly.    Home Safety/Smoke Alarms: Feels safe in home. Smoke alarms in place.  Living environment; residence and Firearm Safety: 1-story house/ trailer. Lives with husband, no needs for DME, good support system Seat Belt Safety/Bike Helmet: Wears seat belt.     Objective:     Vitals: There were no vitals taken for this visit.  There is no height or weight on file to calculate BMI.  Advanced Directives 02/02/2019 04/16/2018 01/31/2018 01/18/2017 04/27/2015  Does Patient Have a Medical Advance Directive? No No No No Yes  Type of Advance Directive - - - - Living will;Healthcare Power of Attorney  Does patient want to make changes to medical advance directive? No - Patient declined - - - No - Patient declined  Copy of [place] Chart? - - - - No - copy requested  Would patient like information on creating a medical advance directive? - - Yes (ED - Information included in AVS) Yes (ED - Information included in AVS) -    Tobacco Social History   Tobacco Use  Smoking Status Never Smoker  Smokeless Tobacco Never Used     Counseling given: Not Answered  Past Medical History:  Diagnosis Date  . Arthritis   . Chest pain    Nuclear, adenosine,  December, 2013, low risk nuclear scan with small, moderate in intensity, fixed anteroseptal defect. This is possibly related to an LBBB versus small prior infarct. No ischemia  . Depression   . Gallstones 11-06  . GERD (gastroesophageal reflux disease)   . HTN (hypertension)   . Hyperlipidemia   . Kidney mass 01/28/2014  . LBBB (left bundle branch block)   . Nephrolithiasis   . Pneumonia   . 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  Type II or unspecified type diabetes mellitus without mention of complication, not stated as uncontrolled    Past Surgical History:  Procedure Laterality Date  . CESAREAN SECTION     x2 ? w/appy  . CHOLECYSTECTOMY    . ESOPHAGOGASTRODUODENOSCOPY    . HERNIA REPAIR    . KNEE ARTHROSCOPY Right 11/2005  . LIVER BIOPSY    . PANCREATIC CYST EXCISION    . SPLENECTOMY    . TONSILLECTOMY AND ADENOIDECTOMY     Family History  Problem Relation Age of Onset  . Liver disease Mother   . Dementia Mother   . Diabetes Mother        borderline  . Coronary artery disease Father   . Heart attack Father   . Hypertension Father   . Heart disease Father   . Cancer Other        leukemia  . Stroke Maternal Grandfather   . Hyperlipidemia Brother        Amyloidosis  . Diabetes Maternal Grandmother   . Cancer Paternal Uncle        unknown  . Heart attack Paternal Grandmother   . Heart attack Paternal Uncle   . Hypertension Brother   . Diabetes Daughter        borderline  . Colon cancer Neg Hx    Social History   Socioeconomic History  . Marital status: Married    Spouse name: TMalyn Aytes .  Number of children: 2  . Years of education: Not on file  . Highest education level: Not on file  Occupational History  . Occupation: CSR    Employer: [employer]  . Occupation: retired  Scientific laboratory technician  . Financial resource strain: Not very hard  . Food insecurity    Worry: Never true    Inability: Never true  . Transportation needs    Medical: No    Non-medical: No  Tobacco Use  . Smoking status: Never Smoker  . Smokeless tobacco: Never Used  Substance and Sexual Activity  . Alcohol use: No  . Drug use: No  . Sexual activity: Not Currently  Lifestyle  . Physical activity    Days per week: 0 days    Minutes per session: 0 min  . Stress: Not at all  Relationships  . Social connections    Talks on phone: More than three times a week    Gets together: More than three times a week     Attends religious service: Not on file    Active member of club or organization: Not on file    Attends meetings of clubs or organizations: Not on file    Relationship status: Married  Other Topics Concern  . Not on file  Social History Narrative   Regular Exercise -  NO    Outpatient Encounter Medications as of 02/02/2019  Medication Sig  . aspirin 81 MG EC tablet Take 81 mg by mouth daily.    . Blood Glucose Monitoring Suppl (ONETOUCH VERIO FLEX SYSTEM) W/DEVICE KIT 1 kit by Does not apply route 3 (three) times daily.  Marland Kitchen buPROPion (WELLBUTRIN XL) 150 MG 24 hr tablet Take 1 tablet (150 mg total) by mouth every morning.  . calcium carbonate (OSCAL) 1500 (600 Ca) MG TABS tablet Take 1,500 mg by mouth 2 (two) times daily with a meal.  . Cyanocobalamin (VITAMIN B12 PO) Take 2,500 mcg by mouth daily.  Marland Kitchen docusate sodium (COLACE) 100 MG capsule Take 100 mg by mouth daily.  . empagliflozin (JARDIANCE) 25 MG TABS tablet Take 25 mg by mouth daily.  . fluticasone (FLONASE) 50 MCG/ACT nasal spray Place 2 sprays into both nostrils daily.  Marland Kitchen glucose blood (ONETOUCH VERIO) test strip Test three times daily as directed. DX: E11.9  . Insulin Pen Needle (B-D ULTRAFINE III SHORT PEN) 31G X 8 MM MISC USE DAILY WITH LANTUS  INSULIN  . LANTUS SOLOSTAR 100 UNIT/ML Solostar Pen INJECT SUBCUTANEOUSLY 30  UNITS AT BEDTIME  . meloxicam (MOBIC) 15 MG tablet   . metFORMIN (GLUCOPHAGE) 1000 MG tablet TAKE 1 TABLET BY MOUTH TWO  TIMES DAILY WITH MEALS  . olmesartan (BENICAR) 40 MG tablet Take 1 tablet (40 mg total) by mouth daily.  Marland Kitchen omeprazole (PRILOSEC) 40 MG capsule Take 1 capsule (40 mg total) by mouth daily.  . ONE TOUCH LANCETS MISC Test three times daily as directed. DX: E11.9  . Probiotic Product (PROBIOTIC ADVANCED PO) Take 1 tablet by mouth daily.  . Pyridoxine HCl (VITAMIN B-6 PO) Take 1 tablet by mouth daily.   . rosuvastatin (CRESTOR) 10 MG tablet Take 1 tablet (10 mg total) by mouth daily.  .  sertraline (ZOLOFT) 100 MG tablet Take 1 tablet (100 mg total) by mouth daily.  . TRULICITY 1.5 XA/1.2IN SOPN INJECT THE CONTENTS OF 1  PEN SUBCUTANEOUSLY WEEKLY  AS DIRECTED  . [DISCONTINUED] buPROPion (WELLBUTRIN XL) 150 MG 24 hr tablet Take 1 tablet (150 mg total) by  mouth every morning.  . [DISCONTINUED] olmesartan (BENICAR) 40 MG tablet TAKE 1 TABLET BY MOUTH DAILY  . [DISCONTINUED] omeprazole (PRILOSEC) 40 MG capsule TAKE 1 CAPSULE BY MOUTH  DAILY  . [DISCONTINUED] rosuvastatin (CRESTOR) 10 MG tablet Take 1 tablet (10 mg total) by mouth daily.  . [DISCONTINUED] sertraline (ZOLOFT) 100 MG tablet Take 1 tablet (100 mg total) by mouth daily.   No facility-administered encounter medications on file as of 02/02/2019.     Activities of Daily Living In your present state of health, do you have any difficulty performing the following activities: 02/02/2019  Hearing? N  Vision? N  Difficulty concentrating or making decisions? N  Walking or climbing stairs? N  Dressing or bathing? N  Doing errands, shopping? N  Preparing Food and eating ? N  Using the Toilet? N  In the past six months, have you accidently leaked urine? N  Do you have problems with loss of bowel control? N  Managing your Medications? N  Managing your Finances? N  Housekeeping or managing your Housekeeping? N  Some recent data might be hidden    Patient Care Team: [name], FNP as PCP - General (Internal Medicine) [name], [name], MD as PCP - Cardiology (Cardiology)    Assessment:   This is a routine wellness examination for Kerrianne. Physical assessment deferred to PCP.  Exercise Activities and Dietary recommendations Current Exercise Habits: The patient does not participate in regular exercise at present, Exercise limited by: None identified  Diet (meal preparation, eat out, water intake, caffeinated beverages, dairy products, fruits and vegetables): in general, a "healthy" diet  , well balanced    Reviewed heart healthy and diabetic diet. Encouraged patient to increase daily water and healthy fluid intake.   Goals    . Do the best I can do with my diabetes     Decrease carbohydrates and sugar, exercise more, enjoy life and family.    . Patient Stated     Take some time daily to do something just for me only for me. Enjoy the upcoming beach trip, life and family.    . Patient Stated     Maintain my current health status.       Fall Risk Fall Risk  02/02/2019 01/31/2018 01/18/2017 01/11/2017 08/25/2015  Falls in the past year? 1 No Yes Yes No  Comment - - - Emmi Telephone Survey: data to providers prior to load -  Number falls in past yr: 0 - 1 2 or more -  Comment - - - Emmi Telephone Survey Actual Response = 5 -  Injury with Fall? 0 - No No -  Risk for fall due to : Impaired balance/gait - Impaired balance/gait;Impaired mobility - -  Follow up Falls prevention discussed - Falls prevention discussed - -    Depression Screen PHQ 2/9 Scores 02/02/2019 02/02/2019 01/31/2018 01/18/2017  PHQ - 2 Score 0 0 6 2  PHQ- 9 Score - 0 15 3     Cognitive Function     6CIT Screen 02/02/2019  What Year? 0 points  What month? 0 points  What time? 0 points  Count back from 20 0 points  Months in reverse 0 points  Repeat phrase 0 points  Total Score 0    Immunization History  Administered Date(s) Administered  . Influenza Split 04/09/2011, 06/12/2012  . Influenza Whole 04/05/2009  . Influenza, High Dose Seasonal PF 04/30/2016, 03/12/2017, 03/04/2018  . Influenza,inj,Quad PF,6+ Mos 04/09/2013, 05/14/2014, 04/26/2015  . Meningococcal  Polysaccharide 08/31/2008  . Pneumococcal Conjugate-13 09/13/2014  . Pneumococcal Polysaccharide-23 08/31/2008, 12/29/2015  . Td 03/26/1999, 10/18/2008  . Tdap 02/02/2019  . Zoster 12/23/2014   Shingrix education was provided. Explained that patient may receive vaccine at local pharmacy or Health Dept.   Screening Tests Health Maintenance  Topic Date  Due  . TETANUS/TDAP  10/19/2018  . FOOT EXAM  02/01/2019  . INFLUENZA VACCINE  09/24/2019 (Originally 01/24/2019)  . HEMOGLOBIN A1C  06/18/2019  . OPHTHALMOLOGY EXAM  08/14/2019  . MAMMOGRAM  11/15/2019  . COLONOSCOPY  03/19/2024  . DEXA SCAN  Completed  . Hepatitis C Screening  Completed  . PNA vac Low Risk Adult  Completed      Plan:    Reviewed health maintenance screenings with patient today and relevant education, vaccines, and/or referrals were provided.   I have personally reviewed and noted the following in the patient's chart:   . Medical and social history . Use of alcohol, tobacco or illicit drugs  . Current medications and supplements . Functional ability and status . Nutritional status . Physical activity . Advanced directives . List of other physicians . Vitals . Screenings to include cognitive, depression, and falls . Referrals and appointments  In addition, I have reviewed and discussed with patient certain preventive protocols, quality metrics, and best practice recommendations. A written personalized care plan for preventive services as well as general preventive health recommendations were provided to patient.     Michiel Cowboy, RN  02/02/2019   Medical screening examination/treatment/procedure(s) were performed by non-physician practitioner and as supervising provider I was immediately available for consultation/collaboration.  I agree with above. [name], FNP

## 2019-02-02 ENCOUNTER — Other Ambulatory Visit (INDEPENDENT_AMBULATORY_CARE_PROVIDER_SITE_OTHER): Payer: Medicare Other

## 2019-02-02 ENCOUNTER — Encounter: Payer: Self-pay | Admitting: Family

## 2019-02-02 ENCOUNTER — Ambulatory Visit (INDEPENDENT_AMBULATORY_CARE_PROVIDER_SITE_OTHER): Payer: Medicare Other | Admitting: *Deleted

## 2019-02-02 ENCOUNTER — Other Ambulatory Visit: Payer: Self-pay

## 2019-02-02 ENCOUNTER — Ambulatory Visit (INDEPENDENT_AMBULATORY_CARE_PROVIDER_SITE_OTHER): Payer: Medicare Other | Admitting: Family

## 2019-02-02 VITALS — BP 130/82 | HR 93 | Ht 62.0 in | Wt 152.0 lb

## 2019-02-02 VITALS — BP 130/82 | HR 93 | Temp 98.3°F | Ht 62.0 in | Wt 152.0 lb

## 2019-02-02 DIAGNOSIS — R5383 Other fatigue: Secondary | ICD-10-CM | POA: Diagnosis not present

## 2019-02-02 DIAGNOSIS — K219 Gastro-esophageal reflux disease without esophagitis: Secondary | ICD-10-CM

## 2019-02-02 DIAGNOSIS — Z794 Long term (current) use of insulin: Secondary | ICD-10-CM | POA: Diagnosis not present

## 2019-02-02 DIAGNOSIS — M8580 Other specified disorders of bone density and structure, unspecified site: Secondary | ICD-10-CM | POA: Diagnosis not present

## 2019-02-02 DIAGNOSIS — I10 Essential (primary) hypertension: Secondary | ICD-10-CM

## 2019-02-02 DIAGNOSIS — E785 Hyperlipidemia, unspecified: Secondary | ICD-10-CM | POA: Diagnosis not present

## 2019-02-02 DIAGNOSIS — K5900 Constipation, unspecified: Secondary | ICD-10-CM

## 2019-02-02 DIAGNOSIS — Z0000 Encounter for general adult medical examination without abnormal findings: Secondary | ICD-10-CM | POA: Diagnosis not present

## 2019-02-02 DIAGNOSIS — E1165 Type 2 diabetes mellitus with hyperglycemia: Secondary | ICD-10-CM | POA: Diagnosis not present

## 2019-02-02 DIAGNOSIS — Z23 Encounter for immunization: Secondary | ICD-10-CM | POA: Diagnosis not present

## 2019-02-02 DIAGNOSIS — F329 Major depressive disorder, single episode, unspecified: Secondary | ICD-10-CM

## 2019-02-02 DIAGNOSIS — F32A Depression, unspecified: Secondary | ICD-10-CM

## 2019-02-02 LAB — TSH: TSH: 1.41 u[IU]/mL (ref 0.35–4.50)

## 2019-02-02 LAB — COMPREHENSIVE METABOLIC PANEL
ALT: 16 U/L (ref 0–35)
AST: 13 U/L (ref 0–37)
Albumin: 4.4 g/dL (ref 3.5–5.2)
Alkaline Phosphatase: 59 U/L (ref 39–117)
BUN: 25 mg/dL — ABNORMAL HIGH (ref 6–23)
CO2: 26 mEq/L (ref 19–32)
Calcium: 9.5 mg/dL (ref 8.4–10.5)
Chloride: 107 mEq/L (ref 96–112)
Creatinine, Ser: 0.87 mg/dL (ref 0.40–1.20)
GFR: 64.62 mL/min (ref >60.00)
Glucose, Bld: 130 mg/dL — ABNORMAL HIGH (ref 70–99)
Potassium: 4.2 mEq/L (ref 3.5–5.1)
Sodium: 142 mEq/L (ref 135–145)
Total Bilirubin: 0.4 mg/dL (ref 0.2–1.2)
Total Protein: 6.6 g/dL (ref 6.0–8.3)

## 2019-02-02 LAB — CBC WITH DIFFERENTIAL/PLATELET
Basophils Absolute: 0.1 10*3/uL (ref 0.0–0.1)
Basophils Relative: 0.5 % (ref 0.0–3.0)
Eosinophils Absolute: 0.3 10*3/uL (ref 0.0–0.7)
Eosinophils Relative: 1.6 % (ref 0.0–5.0)
HCT: 36.8 % (ref 36.0–46.0)
Hemoglobin: 12.1 g/dL (ref 12.0–15.0)
Lymphocytes Relative: 34.1 % (ref 12.0–46.0)
Lymphs Abs: 5.3 10*3/uL — ABNORMAL HIGH (ref 0.7–4.0)
MCHC: 32.7 g/dL (ref 30.0–36.0)
MCV: 89 fl (ref 78.0–100.0)
Monocytes Absolute: 1.5 10*3/uL — ABNORMAL HIGH (ref 0.1–1.0)
Monocytes Relative: 9.5 % (ref 3.0–12.0)
Neutro Abs: 8.4 10*3/uL — ABNORMAL HIGH (ref 1.4–7.7)
Neutrophils Relative %: 54.3 % (ref 43.0–77.0)
Platelets: 416 10*3/uL — ABNORMAL HIGH (ref 150.0–400.0)
RBC: 4.14 Mil/uL (ref 3.87–5.11)
RDW: 14.1 % (ref 11.5–15.5)
WBC: 15.5 10*3/uL — ABNORMAL HIGH (ref 4.0–10.5)

## 2019-02-02 LAB — LIPID PANEL
Cholesterol: 101 mg/dL (ref 0–200)
HDL: 47.4 mg/dL (ref >39.00)
LDL Cholesterol: 38 mg/dL (ref 0–99)
NonHDL: 53.35
Total CHOL/HDL Ratio: 2
Triglycerides: 78 mg/dL (ref 0.0–149.0)
VLDL: 15.6 mg/dL (ref 0.0–40.0)

## 2019-02-02 LAB — VITAMIN D 25 HYDROXY (VIT D DEFICIENCY, FRACTURES): VITD: 46 ng/mL (ref 30.00–100.00)

## 2019-02-02 LAB — MAGNESIUM: Magnesium: 1.7 mg/dL (ref 1.5–2.5)

## 2019-02-02 MED ORDER — OMEPRAZOLE 40 MG PO CPDR: 40.0000 mg | DELAYED_RELEASE_CAPSULE | Freq: Every day | ORAL | 3 refills | Status: DC

## 2019-02-02 MED ORDER — SERTRALINE HCL 100 MG PO TABS: 100.0000 mg | ORAL_TABLET | Freq: Every day | ORAL | 3 refills | Status: DC

## 2019-02-02 MED ORDER — OLMESARTAN MEDOXOMIL 40 MG PO TABS: 40.0000 mg | ORAL_TABLET | Freq: Every day | ORAL | 3 refills | Status: DC

## 2019-02-02 MED ORDER — BUPROPION HCL ER (XL) 150 MG PO TB24: 150.0000 mg | ORAL_TABLET | Freq: Every morning | ORAL | 3 refills | Status: DC

## 2019-02-02 MED ORDER — ROSUVASTATIN CALCIUM 10 MG PO TABS: 10.0000 mg | ORAL_TABLET | Freq: Every day | ORAL | 3 refills | Status: DC

## 2019-02-02 NOTE — Patient Instructions (Signed)

## 2019-02-02 NOTE — Progress Notes (Signed)
Margaret Norman is a 68 y.o. female with the following history as recorded in [facility]:  Patient Active Problem List   Diagnosis Date Noted  . Thyromegaly 01/10/2018  . Overweight (BMI 25.0-29.9) 08/13/2017  . Upper respiratory tract infection 07/30/2017  . Type 2 diabetes mellitus with hyperglycemia, with long-term current use of insulin ([place]) 03/19/2017  . Thiamine deficiency 01/04/2017  . 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 Deficiency anemia 04/30/2016  . Routine general medical examination at a health care facility 04/27/2015  . Abdominal wall hernia 04/26/2015  . Family history of hemochromatosis 09/13/2014  . Acute maxillary sinusitis 05/14/2014  . Personal history of colonic polyps 03/10/2014  . 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 Lumbosacral spondylosis without myelopathy 05/12/2013  . Insomnia, persistent 02/04/2013  . Obesity (BMI 30-39.9) 02/04/2013  . Unspecified asthma, with exacerbation 09/22/2012  . Visit for screening mammogram 07/04/2012  . Nephrolithiasis   . HTN (hypertension)   . Gallstones   . LBBB (left bundle branch block)   . Leukocytosis 12/20/2008  . Hyperlipidemia with target LDL less than 100 09/04/2007  . Chronic depression 09/04/2007  . GERD 09/04/2007    Current Outpatient Medications  Medication Sig Dispense Refill  . aspirin 81 MG EC tablet Take 81 mg by mouth daily.      . Blood Glucose Monitoring Suppl (ONETOUCH VERIO FLEX SYSTEM) W/DEVICE KIT 1 kit by Does not apply route 3 (three) times daily. 1 kit 1  . buPROPion (WELLBUTRIN XL) 150 MG 24 hr tablet Take 1 tablet (150 mg total) by mouth every morning. 90 tablet 3  . calcium carbonate (OSCAL) 1500 (600 Ca) MG TABS tablet Take 1,500 mg by mouth 2 (two) times daily with a meal.    . Cyanocobalamin (VITAMIN B12 PO) Take 2,500 mcg by mouth daily.    Marland Kitchen docusate sodium (COLACE) 100 MG capsule Take 100 mg by mouth daily.    . empagliflozin (JARDIANCE) 25 MG TABS tablet Take 25 mg  by mouth daily. 30 tablet 11  . fluticasone (FLONASE) 50 MCG/ACT nasal spray Place 2 sprays into both nostrils daily. 16 g 6  . glucose blood (ONETOUCH VERIO) test strip Test three times daily as directed. DX: E11.9 300 each 3  . Insulin Pen Needle (B-D ULTRAFINE III SHORT PEN) 31G X 8 MM MISC USE DAILY WITH LANTUS  INSULIN 90 each 1  . LANTUS SOLOSTAR 100 UNIT/ML Solostar Pen INJECT SUBCUTANEOUSLY 30  UNITS AT BEDTIME 30 mL 3  . meloxicam (MOBIC) 15 MG tablet     . metFORMIN (GLUCOPHAGE) 1000 MG tablet TAKE 1 TABLET BY MOUTH TWO  TIMES DAILY WITH MEALS 180 tablet 1  . olmesartan (BENICAR) 40 MG tablet Take 1 tablet (40 mg total) by mouth daily. 90 tablet 3  . omeprazole (PRILOSEC) 40 MG capsule Take 1 capsule (40 mg total) by mouth daily. 90 capsule 3  . ONE TOUCH LANCETS MISC Test three times daily as directed. DX: E11.9 300 each 3  . Probiotic Product (PROBIOTIC ADVANCED PO) Take 1 tablet by mouth daily.    . Pyridoxine HCl (VITAMIN B-6 PO) Take 1 tablet by mouth daily.     . rosuvastatin (CRESTOR) 10 MG tablet Take 1 tablet (10 mg total) by mouth daily. 90 tablet 3  . sertraline (ZOLOFT) 100 MG tablet Take 1 tablet (100 mg total) by mouth daily. 90 tablet 3  . TRULICITY 1.5 LS/9.3TD SOPN INJECT THE CONTENTS OF 1  PEN SUBCUTANEOUSLY WEEKLY  AS DIRECTED 6 mL  2   No current facility-administered medications for this visit.     Allergies: Invokana [canagliflozin] and Amoxicillin  Past Medical History:  Diagnosis Date  . Arthritis   . Chest pain    Nuclear, adenosine,  December, 2013, low risk nuclear scan with small, moderate in intensity, fixed anteroseptal defect. This is possibly related to an LBBB versus small prior infarct. No ischemia  . Depression   . Gallstones 11-06  . GERD (gastroesophageal reflux disease)   . HTN (hypertension)   . Hyperlipidemia   . Kidney mass 01/28/2014  . LBBB (left bundle branch block)   . Nephrolithiasis   . Pneumonia   . Thrombocytosis after  splenectomy 01/07/2014  . Type II or unspecified type diabetes mellitus without mention of complication, not stated as uncontrolled     Past Surgical History:  Procedure Laterality Date  . CESAREAN SECTION     x2 ? w/appy  . CHOLECYSTECTOMY    . ESOPHAGOGASTRODUODENOSCOPY    . HERNIA REPAIR    . KNEE ARTHROSCOPY Right 11/2005  . LIVER BIOPSY    . PANCREATIC CYST EXCISION    . SPLENECTOMY    . TONSILLECTOMY AND ADENOIDECTOMY      Family History  Problem Relation Age of Onset  . Liver disease Mother   . Dementia Mother   . Diabetes Mother        borderline  . Coronary artery disease Father   . Heart attack Father   . Hypertension Father   . Heart disease Father   . Cancer Other        leukemia  . Stroke Maternal Grandfather   . Hyperlipidemia Brother        Amyloidosis  . Diabetes Maternal Grandmother   . Cancer Paternal Uncle        unknown  . Heart attack Paternal Grandmother   . Heart attack Paternal Uncle   . Hypertension Brother   . Diabetes Daughter        borderline  . Colon cancer Neg Hx     Social History   Tobacco Use  . Smoking status: Never Smoker  . Smokeless tobacco: Never Used  Substance Use Topics  . Alcohol use: No    Subjective:  Presents for 6 month follow-up on chronic care needs including hypertension, hyperlipidemia, depression;; will be having her AWV after this appointment; sees GYN regularly- Dr. Orvan Seen at [facility]- they are managing her mammogram and DEXA; under care of endocrinology- last Hgba1c at 7.1; feels like in general, her depression is much better controlled- no concerns today;  Needs to get her Tdap updated today; will plan for high dose flu shot later this fall; Mentions problems with increased constipation recently- using Colace; colonoscopy is up to date- due for updating in 2025;    Objective:  Vitals:   02/02/19 0920  BP: 130/82  Pulse: 93  Temp: 98.3 F (36.8 C)  TempSrc: Oral  SpO2: 97%  Weight: 152 lb  (68.9 kg)  Height: 5' 2"  (1.575 m)    General: Well developed, well nourished, in no acute distress  Skin : Warm and dry.  Head: Normocephalic and atraumatic  Eyes: Sclera and conjunctiva clear; pupils round and reactive to light; extraocular movements intact  Ears: External normal; canals clear; tympanic membranes normal  Oropharynx: Pink, supple. No suspicious lesions  Neck: Supple without thyromegaly, adenopathy  Lungs: Respirations unlabored; clear to auscultation bilaterally without wheeze, rales, rhonchi  CVS exam: normal rate and regular rhythm.  Abdomen: Soft; nontender; nondistended; normoactive bowel sounds; no masses or hepatosplenomegaly  Musculoskeletal: No deformities; no active joint inflammation  Extremities: No edema, cyanosis, clubbing  Vessels: Symmetric bilaterally  Neurologic: Alert and oriented; speech intact; face symmetrical; moves all extremities well; CNII-XII intact without focal deficit   Assessment:  1. Essential hypertension   2. Gastroesophageal reflux disease without esophagitis   3. Hyperlipidemia with target LDL less than 100   4. Osteopenia, unspecified location   5. Other fatigue   6. Chronic depression   7. Constipation, unspecified constipation type     Plan:  Age appropriate preventive healthcare needs addressed; encouraged regular eye doctor and dental exams; encouraged regular exercise; will update labs and refills as needed today; follow-up to be determined; Tdap updated; follow-up in 6 months, sooner prn.   Recommended to try increasing water intake/ trial of Miralax; if symptoms do not improve, follow-up and will need to consider GI consult.   No follow-ups on file.  Orders Placed This Encounter  Procedures  . Tdap vaccine greater than or equal to 7yo IM  . CBC w/Diff    Standing Status:   Future    Standing Expiration Date:   02/02/2020  . Comp Met (CMET)    Standing Status:   Future    Standing Expiration Date:   02/02/2020  .  Lipid panel    Standing Status:   Future    Standing Expiration Date:   02/02/2020  . TSH    Standing Status:   Future    Standing Expiration Date:   02/02/2020  . Vitamin D (25 hydroxy)    Standing Status:   Future    Standing Expiration Date:   02/02/2020  . Magnesium    Standing Status:   Future    Standing Expiration Date:   02/02/2020    Requested Prescriptions   Signed Prescriptions Disp Refills  . olmesartan (BENICAR) 40 MG tablet 90 tablet 3    Sig: Take 1 tablet (40 mg total) by mouth daily.  Marland Kitchen omeprazole (PRILOSEC) 40 MG capsule 90 capsule 3    Sig: Take 1 capsule (40 mg total) by mouth daily.  . rosuvastatin (CRESTOR) 10 MG tablet 90 tablet 3    Sig: Take 1 tablet (10 mg total) by mouth daily.  . sertraline (ZOLOFT) 100 MG tablet 90 tablet 3    Sig: Take 1 tablet (100 mg total) by mouth daily.  Marland Kitchen buPROPion (WELLBUTRIN XL) 150 MG 24 hr tablet 90 tablet 3    Sig: Take 1 tablet (150 mg total) by mouth every morning.

## 2019-02-02 NOTE — Patient Instructions (Addendum)
Continue to eat heart healthy diet (full of fruits, vegetables, whole grains, lean protein, water--limit salt, fat, and sugar intake) and increase physical activity as tolerated.  Continue doing brain stimulating activities (puzzles, reading, adult coloring books, staying active) to keep memory sharp.    Margaret Norman , Thank you for taking time to come for your Medicare Wellness Visit. I appreciate your ongoing commitment to your health goals. Please review the following plan we discussed and let me know if I can assist you in the future.   These are the goals we discussed: Goals    . Do the best I can do with my diabetes     Decrease carbohydrates and sugar, exercise more, enjoy life and family.    . Patient Stated     Take some time daily to do something just for me only for me. Enjoy the upcoming beach trip, life and family.    . Patient Stated     Maintain my current health status.       This is a list of the screening recommended for you and due dates:  Health Maintenance  Topic Date Due  . Tetanus Vaccine  10/19/2018  . Complete foot exam   02/01/2019  . Flu Shot  09/24/2019*  . Hemoglobin A1C  06/18/2019  . Eye exam for diabetics  08/14/2019  . Mammogram  11/15/2019  . Colon Cancer Screening  03/19/2024  . DEXA scan (bone density measurement)  Completed  .  Hepatitis C: One time screening is recommended by Center for Disease Control  (CDC) for  adults born from [birth year] through 1966.   Completed  . Pneumonia vaccines  Completed  *Topic was postponed. The date shown is not the original due date.   women Preventive Care 32 Years and Older, Female Preventive care refers to lifestyle choices and visits with your health care provider that can promote health and wellness. This includes:  A yearly physical exam. This is also called an annual well check.  Regular dental and eye exams.  Immunizations.  Screening for certain conditions.  Healthy lifestyle choices, such as diet  and exercise. What can I expect for my preventive care visit? Physical exam Your health care provider will check:  Height and weight. These may be used to calculate body mass index (BMI), which is a measurement that tells if you are at a healthy weight.  Heart rate and blood pressure.  Your skin for abnormal spots. Counseling Your health care provider may ask you questions about:  Alcohol, tobacco, and drug use.  Emotional well-being.  Home and relationship well-being.  Sexual activity.  Eating habits.  History of falls.  Memory and ability to understand (cognition).  Work and work Statistician.  Pregnancy and menstrual history. What immunizations do I need?  Influenza (flu) vaccine  This is recommended every year. Tetanus, diphtheria, and pertussis (Tdap) vaccine  You may need a Td booster every 10 years. Varicella (chickenpox) vaccine  You may need this vaccine if you have not already been vaccinated. Zoster (shingles) vaccine  You may need this after age 31. Pneumococcal conjugate (PCV13) vaccine  One dose is recommended after age 34. Pneumococcal polysaccharide (PPSV23) vaccine  One dose is recommended after age 43. Measles, mumps, and rubella (MMR) vaccine  You may need at least one dose of MMR if you were born in 1957 or later. You may also need a second dose. Meningococcal conjugate (MenACWY) vaccine  You may need this if you have certain conditions.  Hepatitis A vaccine  You may need this if you have certain conditions or if you travel or work in places where you may be exposed to hepatitis A. Hepatitis B vaccine  You may need this if you have certain conditions or if you travel or work in places where you may be exposed to hepatitis B. Haemophilus influenzae type b (Hib) vaccine  You may need this if you have certain conditions. You may receive vaccines as individual doses or as more than one vaccine together in one shot (combination vaccines).  Talk with your health care provider about the risks and benefits of combination vaccines. What tests do I need? Blood tests  Lipid and cholesterol levels. These may be checked every 5 years, or more frequently depending on your overall health.  Hepatitis C test.  Hepatitis B test. Screening  Lung cancer screening. You may have this screening every year starting at age 35 if you have a 30-pack-year history of smoking and currently smoke or have quit within the past 15 years.  Colorectal cancer screening. All adults should have this screening starting at age 66 and continuing until age 73. Your health care provider may recommend screening at age 58 if you are at increased risk. You will have tests every 1-10 years, depending on your results and the type of screening test.  Diabetes screening. This is done by checking your blood sugar (glucose) after you have not eaten for a while (fasting). You may have this done every 1-3 years.  Mammogram. This may be done every 1-2 years. Talk with your health care provider about how often you should have regular mammograms.  BRCA-related cancer screening. This may be done if you have a family history of breast, ovarian, tubal, or peritoneal cancers. Other tests  Sexually transmitted disease (STD) testing.  Bone density scan. This is done to screen for osteoporosis. You may have this done starting at age 20. Follow these instructions at home: Eating and drinking  Eat a diet that includes fresh fruits and vegetables, whole grains, lean protein, and low-fat dairy products. Limit your intake of foods with high amounts of sugar, saturated fats, and salt.  Take vitamin and mineral supplements as recommended by your health care provider.  Do not drink alcohol if your health care provider tells you not to drink.  If you drink alcohol: ? Limit how much you have to 0-1 drink a day. ? Be aware of how much alcohol is in your drink. In the [place] one drink  equals one 12 oz bottle of beer (355 mL), one 5 oz glass of wine (148 mL), or one 1 oz glass of hard liquor (44 mL). Lifestyle  Take daily care of your teeth and gums.  Stay active. Exercise for at least 30 minutes on 5 or more days each week.  Do not use any products that contain nicotine or tobacco, such as cigarettes, e-cigarettes, and chewing tobacco. If you need help quitting, ask your health care provider.  If you are sexually active, practice safe sex. Use a condom or other form of protection in order to prevent STIs (sexually transmitted infections).  Talk with your health care provider about taking a low-dose aspirin or statin. What's next?  Go to your health care provider once a year for a well check visit.  Ask your health care provider how often you should have your eyes and teeth checked.  Stay up to date on all vaccines. This information is not intended to replace  advice given to you by your health care provider. Make sure you discuss any questions you have with your health care provider. Document Released: 07/08/2015 Document Revised: 06/05/2018 Document Reviewed: 06/05/2018 Elsevier Patient Education  2020 [employer].

## 2019-02-17 ENCOUNTER — Encounter: Payer: Self-pay | Admitting: Family

## 2019-02-18 ENCOUNTER — Ambulatory Visit (INDEPENDENT_AMBULATORY_CARE_PROVIDER_SITE_OTHER): Payer: Medicare Other

## 2019-02-18 DIAGNOSIS — Z23 Encounter for immunization: Secondary | ICD-10-CM

## 2019-02-21 ENCOUNTER — Other Ambulatory Visit: Payer: Self-pay | Admitting: Internal Medicine

## 2019-02-26 ENCOUNTER — Encounter: Payer: Self-pay | Admitting: Internal Medicine

## 2019-02-26 NOTE — Telephone Encounter (Signed)
Please advise 

## 2019-03-09 ENCOUNTER — Other Ambulatory Visit: Payer: Self-pay | Admitting: Obstetrics and Gynecology

## 2019-03-09 DIAGNOSIS — R928 Other abnormal and inconclusive findings on diagnostic imaging of breast: Secondary | ICD-10-CM

## 2019-03-13 ENCOUNTER — Ambulatory Visit
Admission: RE | Admit: 2019-03-13 | Discharge: 2019-03-13 | Disposition: A | Discharge status: Home / Self Care | Payer: Medicare Other | Source: Ambulatory Visit | Attending: Obstetrics and Gynecology | Admitting: Obstetrics and Gynecology

## 2019-03-13 ENCOUNTER — Other Ambulatory Visit: Payer: Self-pay

## 2019-03-13 ENCOUNTER — Other Ambulatory Visit: Payer: Self-pay | Admitting: Obstetrics and Gynecology

## 2019-03-13 DIAGNOSIS — R928 Other abnormal and inconclusive findings on diagnostic imaging of breast: Secondary | ICD-10-CM

## 2019-03-13 DIAGNOSIS — N6489 Other specified disorders of breast: Secondary | ICD-10-CM

## 2019-03-16 ENCOUNTER — Ambulatory Visit
Admission: RE | Admit: 2019-03-16 | Discharge: 2019-03-16 | Disposition: A | Discharge status: Home / Self Care | Payer: Medicare Other | Source: Ambulatory Visit | Attending: Obstetrics and Gynecology | Admitting: Obstetrics and Gynecology

## 2019-03-16 ENCOUNTER — Other Ambulatory Visit: Payer: Self-pay | Admitting: Body Imaging

## 2019-03-16 ENCOUNTER — Encounter: Payer: Self-pay | Admitting: Family

## 2019-03-16 ENCOUNTER — Other Ambulatory Visit: Payer: Self-pay | Admitting: Obstetrics and Gynecology

## 2019-03-16 ENCOUNTER — Other Ambulatory Visit: Payer: Self-pay

## 2019-03-16 DIAGNOSIS — N6489 Other specified disorders of breast: Secondary | ICD-10-CM

## 2019-03-16 NOTE — Telephone Encounter (Signed)
I have talked with patient---she went for biopsy of right breast today and just wants to wait for those results---patient states she has stabbing pain on right side of head and neck, slight vision problems and cannot walk a straight line--off and on at different times of day and it has been happening for several weeks--patient checks sugars regularly thru out day and sugars are well controlled, she has taken all bp meds as directed, but does not have a way to check blood pressure at home----I have advised that symptoms like this can warrant emergent care at ED, she could be experiencing stroke and really should be seen by someone today, and our [facility] does not have a long wait and could do all testing needed without long wait---patient states she does not want to go anywhere else today, if she worsens, she will call 911, but she just thinks its related to her current oncology condition and does not want to go to ED, she also states she wants to cancel her appt with laura on wed 9/23, she will call back if she needs to make another appt later

## 2019-03-18 ENCOUNTER — Ambulatory Visit: Payer: Medicare Other | Admitting: Family

## 2019-03-23 ENCOUNTER — Encounter: Payer: Self-pay | Admitting: Cardiovascular Disease

## 2019-03-23 ENCOUNTER — Other Ambulatory Visit: Payer: Self-pay

## 2019-03-23 ENCOUNTER — Ambulatory Visit: Payer: Medicare Other | Admitting: Cardiovascular Disease

## 2019-03-23 VITALS — BP 122/78 | HR 87 | Ht 62.0 in | Wt 151.0 lb

## 2019-03-23 DIAGNOSIS — I447 Left bundle-branch block, unspecified: Secondary | ICD-10-CM | POA: Diagnosis not present

## 2019-03-23 DIAGNOSIS — I10 Essential (primary) hypertension: Secondary | ICD-10-CM | POA: Diagnosis not present

## 2019-03-23 NOTE — Progress Notes (Signed)
Primary cardiologist - [name], previous Margaret Norman patient   Problem List  1. Chest pain  2. LBBB 3. Diabetes Mellitus  4. Essential Hypertension 5. Hyperlipidemia  Previous notes.    Margaret Norman is seen today for initial visit .  Previous patient of Dr. Ron Norman. Seen with husband , Margaret Norman  Has been having some ankle and leg swelling . BP had been higher.   Had been having lots of anxiety . Had stopped taking the Invokana  Was started on HCTZ 12.5 a day BP is better,  Has had some dizziness with that. Leg edema has resolved.   Has + family hx of heart disease Brother has HTN and LVH Father had an aortic issue.   Has been fatigued.  Has occasional PVC No CP .   Has some discomfort across her back  Does not get any regular exercise,   Gets short of breath with household chores.   12/22/2015: Margaret Norman is seen back today for follow-up visit. She was having some chest discomfort when we initially saw her several months ago.  She was on depleted at her initial visit. We stopped her HCTZ at that time. Stress Myoview study revealed no evidence of ischemia. Ejection fraction was 48% by nuclear study. By echocardiography, her EF was 50-55%. She does have grade 1 diastolic dysfunction.  Still having some issues with chest pain .  Worse with mental stress.   Aug. 16, 2018:    Margaret Norman is seen today  BP has been well control She has heard about the valsartan recall.   Aug. 30 , 2019   No CP or dyspnea.   Still exercising regularly .    Has normal LV function by echo in 2017 In  reviewing her labs, her LDL is 99.  She is diabetic.  Her LDL probably should be around 70.  Sept. 28, 2020   Margaret Norman is seen today for follow up of her HTN, hyperlipidemia.  She also has diabetes. Was just found to have R breast cancer .   Was picked up on screening mammogram  Going to surgeon tomorrow.    No CP ,  Walks regularly  Diabetes is fairly well controlled.  Has LBBB , normal LV function     Allergies  Allergen Reactions  . Invokana [Canagliflozin] Other (See Comments)    UTI's  . Amoxicillin     Sore tongue    Current Outpatient Medications  Medication Sig Dispense Refill  . aspirin 81 MG EC tablet Take 81 mg by mouth daily.      . Blood Glucose Monitoring Suppl (ONETOUCH VERIO FLEX SYSTEM) W/DEVICE KIT 1 kit by Does not apply route 3 (three) times daily. 1 kit 1  . buPROPion (WELLBUTRIN XL) 150 MG 24 hr tablet Take 1 tablet (150 mg total) by mouth every morning. 90 tablet 3  . calcium carbonate (OSCAL) 1500 (600 Ca) MG TABS tablet Take 1,500 mg by mouth 2 (two) times daily with a meal.    . Cyanocobalamin (VITAMIN B12 PO) Take 2,500 mcg by mouth daily.    Marland Kitchen docusate sodium (COLACE) 100 MG capsule Take 100 mg by mouth daily.    . empagliflozin (JARDIANCE) 25 MG TABS tablet Take 25 mg by mouth daily. 30 tablet 11  . fluticasone (FLONASE) 50 MCG/ACT nasal spray Place 2 sprays into both nostrils daily. 16 g 6  . glucose blood (ONETOUCH VERIO) test strip Test three times daily as directed. DX: E11.9 300 each 3  .  Insulin Pen Needle (B-D ULTRAFINE III SHORT PEN) 31G X 8 MM MISC USE DAILY WITH LANTUS  INSULIN 90 each 1  . LANTUS SOLOSTAR 100 UNIT/ML Solostar Pen INJECT SUBCUTANEOUSLY 30  UNITS AT BEDTIME 30 mL 3  . meloxicam (MOBIC) 15 MG tablet     . metFORMIN (GLUCOPHAGE) 1000 MG tablet TAKE 1 TABLET BY MOUTH  TWICE DAILY WITH MEALS 180 tablet 3  . olmesartan (BENICAR) 40 MG tablet Take 1 tablet (40 mg total) by mouth daily. 90 tablet 3  . omeprazole (PRILOSEC) 40 MG capsule Take 1 capsule (40 mg total) by mouth daily. 90 capsule 3  . ONE TOUCH LANCETS MISC Test three times daily as directed. DX: E11.9 300 each 3  . Probiotic Product (PROBIOTIC ADVANCED PO) Take 1 tablet by mouth daily.    . Pyridoxine HCl (VITAMIN B-6 PO) Take 1 tablet by mouth daily.     . rosuvastatin (CRESTOR) 10 MG tablet Take 1 tablet (10 mg total) by mouth daily. 90 tablet 3  . sertraline (ZOLOFT)  100 MG tablet Take 1 tablet (100 mg total) by mouth daily. 90 tablet 3  . TRULICITY 1.5 ZO/1.0RU SOPN INJECT THE CONTENTS OF 1  PEN SUBCUTANEOUSLY WEEKLY  AS DIRECTED 6 mL 2   No current facility-administered medications for this visit.     Social History   Socioeconomic History  . Marital status: Married    Spouse name: Cheyane Ayon  . Number of children: 2  . Years of education: Not on file  . Highest education level: Not on file  Occupational History  . Occupation: CSR    Employer: [employer]  . Occupation: retired  Scientific laboratory technician  . Financial resource strain: Not very hard  . Food insecurity    Worry: Never true    Inability: Never true  . Transportation needs    Medical: No    Non-medical: No  Tobacco Use  . Smoking status: Never Smoker  . Smokeless tobacco: Never Used  Substance and Sexual Activity  . Alcohol use: No  . Drug use: No  . Sexual activity: Not Currently  Lifestyle  . Physical activity    Days per week: 0 days    Minutes per session: 0 min  . Stress: Not at all  Relationships  . Social connections    Talks on phone: More than three times a week    Gets together: More than three times a week    Attends religious service: Not on file    Active member of club or organization: Not on file    Attends meetings of clubs or organizations: Not on file    Relationship status: Married  . Intimate partner violence    Fear of current or ex partner: No    Emotionally abused: No    Physically abused: No    Forced sexual activity: No  Other Topics Concern  . Not on file  Social History Narrative   Regular Exercise -  NO    Family History  Problem Relation Age of Onset  . Liver disease Mother   . Dementia Mother   . Diabetes Mother        borderline  . Coronary artery disease Father   . Heart attack Father   . Hypertension Father   . Heart disease Father   . Cancer Other        leukemia  . Stroke Maternal Grandfather   . Hyperlipidemia  Brother  Amyloidosis  . Diabetes Maternal Grandmother   . Cancer Paternal Uncle        unknown  . Heart attack Paternal Grandmother   . Heart attack Paternal Uncle   . Hypertension Brother   . Diabetes Daughter        borderline  . Colon cancer Neg Hx     Past Medical History:  Diagnosis Date  . Arthritis   . Chest pain    Nuclear, adenosine,  December, 2013, low risk nuclear scan with small, moderate in intensity, fixed anteroseptal defect. This is possibly related to an LBBB versus small prior infarct. No ischemia  . Depression   . Gallstones 11-06  . GERD (gastroesophageal reflux disease)   . HTN (hypertension)   . Hyperlipidemia   . Kidney mass 01/28/2014  . LBBB (left bundle branch block)   . Nephrolithiasis   . Pneumonia   . 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 Type II or unspecified type diabetes mellitus without mention of complication, not stated as uncontrolled     Past Surgical History:  Procedure Laterality Date  . CESAREAN SECTION     x2 ? w/appy  . CHOLECYSTECTOMY    . ESOPHAGOGASTRODUODENOSCOPY    . HERNIA REPAIR    . KNEE ARTHROSCOPY Right 11/2005  . LIVER BIOPSY    . PANCREATIC CYST EXCISION    . SPLENECTOMY    . TONSILLECTOMY AND ADENOIDECTOMY      Patient Active Problem List   Diagnosis Date Noted  . LBBB (left bundle branch block)     Priority: High  . Thyromegaly 01/10/2018  . Overweight (BMI 25.0-29.9) 08/13/2017  . Upper respiratory tract infection 07/30/2017  . Type 2 diabetes mellitus with hyperglycemia, with long-term current use of insulin ([place]) 03/19/2017  . Thiamine deficiency 01/04/2017  . 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 Deficiency anemia 04/30/2016  . Routine general medical examination at a health care facility 04/27/2015  . Abdominal wall hernia 04/26/2015  . Family history of hemochromatosis 09/13/2014  . Acute maxillary sinusitis 05/14/2014  . Personal history of colonic polyps 03/10/2014  .  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 Lumbosacral spondylosis without myelopathy 05/12/2013  . Insomnia, persistent 02/04/2013  . Obesity (BMI 30-39.9) 02/04/2013  . Unspecified asthma, with exacerbation 09/22/2012  . Visit for screening mammogram 07/04/2012  . Nephrolithiasis   . HTN (hypertension)   . Gallstones   . Leukocytosis 12/20/2008  . Hyperlipidemia with target LDL less than 100 09/04/2007  . Chronic depression 09/04/2007  . GERD 09/04/2007    ROS  Noted in current hx   Physical Exam: There were no vitals taken for this visit.  GEN:  Well nourished, well developed in no acute distress HEENT: Normal NECK: No JVD; No carotid bruits LYMPHATICS: No lymphadenopathy CARDIAC: RRR , no murmurs, rubs, gallops RESPIRATORY:  Clear to auscultation without rales, wheezing or rhonchi  ABDOMEN: Soft, non-tender, non-distended MUSCULOSKELETAL:  No edema; No deformity  SKIN: Warm and dry NEUROLOGIC:  Alert and oriented x 3   ECG :   March 23, 2019: Normal sinus rhythm at 87.  Right axis deviation.  Nonspecific IVCD/ LBBB   ASSESSMENT & PLAN  1. Chest pain  - no recent CP   2. LBBB -stable.  She had an echocardiogram in 2017 which revealed normal left ventricular systolic function.  She has mild diastolic dysfunction.  Myoview study revealed no evidence of ischemia.  3. Diabetes Mellitus -glucose levels are largely well controlled.  4. Essential Hypertension -blood  pressures currently well controlled.  Continue current medications.  5. Hyperlipidemia -  recent lipid levels look great.  6.  Right breast cancer: She was recently diagnosed as having breast cancer.  She is going to see the surgeon later this week.  From a cardiac standpoint she is very stable.  She has a history of hypertension and left bundle branch block.  She has normal left ventricular systolic function.  If she needs to have surgery for lumpectomy or more involved surgery, she will be at low risk  for any cardiac complications .   Mertie Moores, MD  03/23/2019 8:03 AM    [address],  [address], [place]  [address] Pager [phone] Phone: [phone]; Fax: [phone]

## 2019-03-23 NOTE — Patient Instructions (Signed)

## 2019-03-24 ENCOUNTER — Other Ambulatory Visit: Payer: Self-pay | Admitting: Surgery

## 2019-03-24 DIAGNOSIS — Z853 Personal history of malignant neoplasm of breast: Secondary | ICD-10-CM

## 2019-03-25 ENCOUNTER — Other Ambulatory Visit: Payer: Self-pay | Admitting: Surgery

## 2019-03-25 ENCOUNTER — Telehealth: Payer: Self-pay | Admitting: Hematology and Oncology

## 2019-03-25 ENCOUNTER — Encounter: Payer: Self-pay | Admitting: Adult Health

## 2019-03-25 DIAGNOSIS — C50411 Malignant neoplasm of upper-outer quadrant of right female breast: Secondary | ICD-10-CM | POA: Insufficient documentation

## 2019-03-25 DIAGNOSIS — Z853 Personal history of malignant neoplasm of breast: Secondary | ICD-10-CM

## 2019-03-25 NOTE — Telephone Encounter (Signed)
Received a new patient referral from Dr. [name] at [facility] for new dx of breast cancer. I cld and lft a vm with an appt for Ms. Blaklock to see Dr. Lindi Adie on 10/13 at 345pm. I will mail the pt a letter.

## 2019-03-26 HISTORY — PX: BREAST BIOPSY: SHX20

## 2019-03-30 ENCOUNTER — Other Ambulatory Visit: Payer: Self-pay

## 2019-04-01 ENCOUNTER — Other Ambulatory Visit: Payer: Self-pay

## 2019-04-01 ENCOUNTER — Ambulatory Visit
Admission: RE | Admit: 2019-04-01 | Discharge: 2019-04-01 | Disposition: A | Discharge status: Home / Self Care | Payer: Medicare Other | Source: Ambulatory Visit | Attending: Radiation Oncology | Admitting: Radiation Oncology

## 2019-04-01 ENCOUNTER — Ambulatory Visit (INDEPENDENT_AMBULATORY_CARE_PROVIDER_SITE_OTHER): Payer: Medicare Other | Admitting: Internal Medicine

## 2019-04-01 ENCOUNTER — Encounter: Payer: Self-pay | Admitting: Radiation Oncology

## 2019-04-01 ENCOUNTER — Encounter: Payer: Self-pay | Admitting: Internal Medicine

## 2019-04-01 VITALS — BP 147/67 | HR 94 | Temp 98.5°F | Resp 16 | Ht 62.0 in | Wt 155.0 lb

## 2019-04-01 VITALS — BP 140/88 | HR 87 | Ht 61.75 in | Wt 153.0 lb

## 2019-04-01 DIAGNOSIS — Z170 Estrogen receptor positive status [ER+]: Secondary | ICD-10-CM | POA: Insufficient documentation

## 2019-04-01 DIAGNOSIS — E1165 Type 2 diabetes mellitus with hyperglycemia: Secondary | ICD-10-CM

## 2019-04-01 DIAGNOSIS — C50411 Malignant neoplasm of upper-outer quadrant of right female breast: Secondary | ICD-10-CM

## 2019-04-01 DIAGNOSIS — E785 Hyperlipidemia, unspecified: Secondary | ICD-10-CM | POA: Diagnosis not present

## 2019-04-01 DIAGNOSIS — Z800 Family history of malignant neoplasm of digestive organs: Secondary | ICD-10-CM | POA: Diagnosis not present

## 2019-04-01 DIAGNOSIS — K219 Gastro-esophageal reflux disease without esophagitis: Secondary | ICD-10-CM | POA: Insufficient documentation

## 2019-04-01 DIAGNOSIS — Z794 Long term (current) use of insulin: Secondary | ICD-10-CM

## 2019-04-01 DIAGNOSIS — Z7982 Long term (current) use of aspirin: Secondary | ICD-10-CM | POA: Diagnosis not present

## 2019-04-01 DIAGNOSIS — M129 Arthropathy, unspecified: Secondary | ICD-10-CM | POA: Insufficient documentation

## 2019-04-01 DIAGNOSIS — E119 Type 2 diabetes mellitus without complications: Secondary | ICD-10-CM | POA: Insufficient documentation

## 2019-04-01 DIAGNOSIS — Z79899 Other long term (current) drug therapy: Secondary | ICD-10-CM | POA: Diagnosis not present

## 2019-04-01 DIAGNOSIS — I10 Essential (primary) hypertension: Secondary | ICD-10-CM | POA: Insufficient documentation

## 2019-04-01 DIAGNOSIS — F329 Major depressive disorder, single episode, unspecified: Secondary | ICD-10-CM | POA: Diagnosis not present

## 2019-04-01 DIAGNOSIS — E663 Overweight: Secondary | ICD-10-CM | POA: Diagnosis not present

## 2019-04-01 LAB — POCT GLYCOSYLATED HEMOGLOBIN (HGB A1C): Hemoglobin A1C: 7.4 % — AB (ref 4.0–5.6)

## 2019-04-01 NOTE — Patient Instructions (Signed)
Coronavirus (COVID-19) Are you at risk?  Are you at risk for the Coronavirus (COVID-19)?  To be considered HIGH RISK for Coronavirus (COVID-19), you have to meet the following criteria:  . Traveled to [place], [place], [place], [place] or [place]; or in the [place] to [place], [place], [place], or [place]; and have fever, cough, and shortness of breath within the last 2 weeks of travel OR . Been in close contact with a person diagnosed with COVID-19 within the last 2 weeks and have fever, cough, and shortness of breath . IF YOU DO NOT MEET THESE CRITERIA, YOU ARE CONSIDERED LOW RISK FOR COVID-19.  What to do if you are HIGH RISK for COVID-19?  . If you are having a medical emergency, call 911. . Seek medical care right away. Before you go to a doctor's office, urgent care or emergency department, call ahead and tell them about your recent travel, contact with someone diagnosed with COVID-19, and your symptoms. You should receive instructions from your physician's office regarding next steps of care.  . When you arrive at healthcare provider, tell the healthcare staff immediately you have returned from visiting [place], [place], [place], [place] or [place]; or traveled in the [place] to [place], [place], [place], or [place]; in the last two weeks or you have been in close contact with a person diagnosed with COVID-19 in the last 2 weeks.   . Tell the health care staff about your symptoms: fever, cough and shortness of breath. . After you have been seen by a medical provider, you will be either: o Tested for (COVID-19) and discharged home on quarantine except to seek medical care if symptoms worsen, and asked to  - Stay home and avoid contact with others until you get your results (4-5 days)  - Avoid travel on public transportation if possible (such as bus, train, or airplane) or o Sent to the Emergency Department by EMS for evaluation, COVID-19 testing, and possible  admission depending on your condition and test results.  What to do if you are LOW RISK for COVID-19?  Reduce your risk of any infection by using the same precautions used for avoiding the common cold or flu:  . Wash your hands often with soap and warm water for at least 20 seconds.  If soap and water are not readily available, use an alcohol-based hand sanitizer with at least 60% alcohol.  . If coughing or sneezing, cover your mouth and nose by coughing or sneezing into the elbow areas of your shirt or coat, into a tissue or into your sleeve (not your hands). . Avoid shaking hands with others and consider head nods or verbal greetings only. . Avoid touching your eyes, nose, or mouth with unwashed hands.  . Avoid close contact with people who are sick. . Avoid places or events with large numbers of people in one location, like concerts or sporting events. . Carefully consider travel plans you have or are making. . If you are planning any travel outside or inside the [place], visit the CDC's Travelers' Health webpage for the latest health notices. . If you have some symptoms but not all symptoms, continue to monitor at home and seek medical attention if your symptoms worsen. . If you are having a medical emergency, call 911.   ADDITIONAL HEALTHCARE OPTIONS FOR PATIENTS  Un[facility] Telehealth / e-Visit: [email or web address]         [facility]: [phone]  [facility]: [phone]                   [facility]: [phone]   

## 2019-04-01 NOTE — Patient Instructions (Addendum)
Please  continue: - Metformin 1000 mg 2x a day with meals - Lantus 30 units at night - Jardiance 25 mg daily before breakfast  Increase: - Trulicity to 3 mg weekly  Please return in 3-4 months with your sugar log.

## 2019-04-01 NOTE — Progress Notes (Signed)
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 This measured approximately 1.3 cm by 1 cm at the 9 o'clock position 4-5 cm from the right nipple.    Past/Anticipated interventions by surgeon, if any: 04/15/19:   Coralie Keens, MD Primary   Procedure Laterality Anesthesia  RIGHT BREAST PARTAL MASTECTOMY WITH RADIOACTIVE SEED AND SENTINEL LYMPH NODE BIOPSY Right General  INSERTION PORT-A-CATH WITH ULTRASOUND N/A General       Past/Anticipated interventions by medical oncology, if any: Chemotherapy Initial consult with Dr. Lindi Adie 04/07/19  Lymphedema issues, if any:  Pt has not had surgery at this time.  Pain issues, if any: Pt denies c/o pain.  SAFETY ISSUES:  Prior radiation? No  Pacemaker/ICD? No  Possible current pregnancy? No  Is the patient on methotrexate? No  Current Complaints / other details:  Pt presents today for initial consult with Dr. Sondra Come for Radiation Oncology.   BP (!) 147/67 (BP Location: Left Arm, Patient Position: Sitting)   Pulse 94   Temp 98.5 F (36.9 C) (Temporal)   Resp 16   Ht 5' 2"  (1.575 m)   Wt 155 lb (70.3 kg)   SpO2 98%   BMI 28.35 kg/m   Wt Readings from Last 3 Encounters:  04/01/19 155 lb (70.3 kg)  04/01/19 153 lb (69.4 kg)  03/23/19 151 lb (68.5 kg)       Loma Sousa, RN 04/01/2019,2:40 PM

## 2019-04-01 NOTE — Progress Notes (Signed)
Radiation Oncology         [phone] ________________________________  Initial Outpatient Consultation  Name: Margaret Norman MRN: [id number]  Date: 04/01/2019  DOB: 08-24-1950  [place]:[id number], Margaret Repress, FNP  Margaret Keens, MD   REFERRING PHYSICIAN: Coralie Keens, MD  DIAGNOSIS: The encounter diagnosis was Malignant neoplasm of upper-outer quadrant of right breast in female, estrogen receptor positive (Margaret Norman).  Stage T1c Right Breast UOQ, Invasive Ductal Carcinoma, ER+ / PR+ / Her2+, Grade 2  HISTORY OF PRESENT ILLNESS::Margaret Norman is a 68 y.o. female who is accompanied by no one due to COVID-19 restrictions.  She had routine screening mammography on 03/05/2019 showing a possible abnormality in the right breast. She underwent right diagnostic mammography with ultrasonography on 03/13/2019 showing: suspicious 1.3 cm irregular mass in the right breast at 9 o'clock.  Biopsy on 03/16/2019 showed: invasive ductal carcinoma with calcifications, at least grade 2. Prognostic indicators significant for: estrogen receptor, 100% positive and progesterone receptor, 3% positive. Proliferation marker Ki67 at 35%. HER2 positive.  She is scheduled to meet with Dr. Lindi Norman on 04/07/2019. She is also scheduled to undergo right lumpectomy on 04/15/2019 under Dr. Ninfa Norman.   PREVIOUS RADIATION THERAPY: No  PAST MEDICAL HISTORY:  Past Medical History:  Diagnosis Date   Arthritis    Chest pain    Nuclear, adenosine,  December, 2013, low risk nuclear scan with small, moderate in intensity, fixed anteroseptal defect. This is possibly related to an LBBB versus small prior infarct. 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 w/appy   CHOLECYSTECTOMY     ESOPHAGOGASTRODUODENOSCOPY     HERNIA REPAIR     KNEE ARTHROSCOPY Right 11/2005   LIVER BIOPSY     PANCREATIC CYST EXCISION     SPLENECTOMY     TONSILLECTOMY AND ADENOIDECTOMY      FAMILY HISTORY:  Family History  Problem Relation Age of Onset   Liver disease Mother    Dementia Mother    Diabetes Mother        borderline   Coronary artery disease Father    Heart attack Father    Hypertension Father    Heart disease Father    Cancer Other        leukemia   Stroke Maternal Grandfather    Hyperlipidemia Brother        Amyloidosis   Diabetes Maternal Grandmother    Cancer Paternal Uncle        unknown   Heart attack Paternal Grandmother    Heart attack Paternal Uncle    Hypertension Brother    Diabetes Daughter        borderline   Colon cancer Neg Hx     SOCIAL HISTORY:  Social History   Tobacco Use   Smoking status: Never Smoker   Smokeless tobacco: Never Used  Substance Use Topics   Alcohol use: No   Drug use: No    ALLERGIES:  Allergies  Allergen Reactions   Invokana [Canagliflozin] Other (  See Comments)    UTI's   Amoxicillin     Sore tongue    MEDICATIONS:  Current Outpatient Medications  Medication Sig Dispense Refill   aspirin 81 MG EC tablet Take 81 mg by mouth daily.       Blood Glucose Monitoring Suppl (ONETOUCH VERIO FLEX SYSTEM) W/DEVICE KIT 1 kit by Does not apply route 3 (three) times daily. 1 kit 1   buPROPion (WELLBUTRIN XL) 150 MG 24 hr tablet Take 1 tablet (150 mg total) by mouth every morning. 90 tablet 3   calcium carbonate (OSCAL) 1500 (600 Ca) MG TABS tablet Take 1,500 mg by mouth 2 (two) times daily with a meal.     Cyanocobalamin (VITAMIN B12 PO) Take 2,500 mcg by mouth daily.      docusate sodium (COLACE) 100 MG capsule Take 100 mg by mouth daily.     empagliflozin (JARDIANCE) 25 MG TABS tablet Take 25 mg by mouth daily. 30 tablet 11   fluticasone (FLONASE) 50 MCG/ACT nasal spray Place 2 sprays into both nostrils daily. 16 g 6   glucose blood (ONETOUCH VERIO) test strip Test three times daily as directed. DX: E11.9 300 each 3   Insulin Pen Needle (B-D ULTRAFINE III SHORT PEN) 31G X 8 MM MISC USE DAILY WITH LANTUS  INSULIN 90 each 1   LANTUS SOLOSTAR 100 UNIT/ML Solostar Pen INJECT SUBCUTANEOUSLY 30  UNITS AT BEDTIME 30 mL 3   meloxicam (MOBIC) 15 MG tablet      metFORMIN (GLUCOPHAGE) 1000 MG tablet TAKE 1 TABLET BY MOUTH  TWICE DAILY WITH MEALS 180 tablet 3   olmesartan (BENICAR) 40 MG tablet Take 1 tablet (40 mg total) by mouth daily. 90 tablet 3   omeprazole (PRILOSEC) 40 MG capsule Take 1 capsule (40 mg total) by mouth daily. 90 capsule 3   ONE TOUCH LANCETS MISC Test three times daily as directed. DX: E11.9 300 each 3   Probiotic Product (PROBIOTIC ADVANCED PO) Take 1 tablet by mouth daily.     Pyridoxine HCl (VITAMIN B-6 PO) Take 1 tablet by mouth daily.      rosuvastatin (CRESTOR) 10 MG tablet Take 1 tablet (10 mg total) by mouth daily. 90 tablet 3   sertraline (ZOLOFT) 100 MG tablet Take 1 tablet (100 mg total) by mouth daily. 90 tablet 3   TRULICITY 1.5 EH/2.0NO SOPN INJECT THE CONTENTS OF 1  PEN SUBCUTANEOUSLY WEEKLY  AS DIRECTED 6 mL 2   No current facility-administered medications for this encounter.     REVIEW OF SYSTEMS:  A 10+ POINT REVIEW OF SYSTEMS WAS OBTAINED including neurology, dermatology, psychiatry, cardiac, respiratory, lymph, extremities, GI, GU, musculoskeletal, constitutional, reproductive, HEENT.    PHYSICAL EXAM:  height is _0  (1.575 m) and weight is 155 lb (70.3 kg). Her temporal temperature is 98.5 F (36.9 C). Her blood pressure is 147/67 (abnormal) and her pulse is 94. Her respiration is 16 and oxygen saturation is 98%.    General: Alert and oriented, in no acute distress HEENT: Head is normocephalic. Extraocular movements are intact. Oropharynx is clear. Neck: Neck is supple, no palpable cervical or supraclavicular lymphadenopathy. Heart: Regular in rate and rhythm with no murmurs, rubs, or gallops. Chest: Clear to auscultation bilaterally, with no rhonchi, wheezes, or rales. Abdomen: Soft, nontender, nondistended, with no rigidity or guarding.  Patient has a long vertical scar from prior pancreatic cyst removal. Extremities: No cyanosis or edema. Lymphatics: see Neck Exam Skin: No concerning lesions. Musculoskeletal: symmetric strength and  muscle tone throughout. Neurologic: Cranial nerves II through XII are grossly intact. No obvious focalities. Speech is fluent. Coordination is intact. Psychiatric: Judgment and insight are intact. Affect is appropriate. Left Breast: no palpable mass, nipple discharge or bleeding. Right Breast: Patient has a biopsy site in the lateral aspect of the breast.  No significant bruising.  No dominant mass appreciated breast nipple discharge or bleeding.  No palpable axillary adenopathy.  ECOG = 1  0 - Asymptomatic (Fully active, able to carry on all predisease activities without restriction)  1 - Symptomatic but completely ambulatory (Restricted in physically strenuous activity but ambulatory and able to carry out work of a light or sedentary nature. For example, light housework, office work)  2 - Symptomatic, <50% in bed during the day (Ambulatory and capable of all self care but unable to carry out any work activities. Up and about more than 50% of waking hours)  3 - Symptomatic, >50% in bed, but not bedbound (Capable of only limited self-care, confined to bed or chair 50% or more of waking hours)  4 - Bedbound (Completely disabled. Cannot carry on any self-care. Totally confined to bed or chair)  5 - Death   Eustace Pen MM, Creech RH, Tormey DC, et al. [phone]). "Toxicity and  response criteria of the [facility]". [place] Oncol. 5 (6): 649-55  LABORATORY DATA:  Lab Results  Component Value Date   WBC 15.5 (H) 02/02/2019   HGB 12.1 02/02/2019   HCT 36.8 02/02/2019   MCV 89.0 02/02/2019   PLT 416.0 (H) 02/02/2019   NEUTROABS 8.4 (H) 02/02/2019   Lab Results  Component Value Date   NA 142 02/02/2019   K 4.2 02/02/2019   CL 107 02/02/2019   CO2 26 02/02/2019   GLUCOSE 130 (H) 02/02/2019   CREATININE 0.87 02/02/2019   CALCIUM 9.5 02/02/2019      RADIOGRAPHY: US Breast Ltd Uni Right Inc Axilla  Result Date: 03/13/2019 CLINICAL DATA:  68 year old patient recalled from recent screening for evaluation of possible asymmetry in the right breast. EXAM: DIGITAL DIAGNOSTIC RIGHT MAMMOGRAM WITH CAD AND TOMO ULTRASOUND RIGHT BREAST COMPARISON:  March 05, 2019 ACR Breast Density Category b: There are scattered areas of fibroglandular density. FINDINGS: Additional mammographic views performed today confirm an irregular mass in the 9 o'clock axis of the right breast, middle third. Mammographic images were processed with CAD. On physical exam, there is a subtle mass/thickening in the 9 o'clock position of the right breast 4-5 cm from the nipple. Targeted ultrasound is performed, showing a hypoechoic irregular mass with indistinct margins. The mass measures approximately 1.3 x 1.0 x 0.8 cm and is at 9 o'clock position 4-5 cm from the nipple. Ultrasound of the right axilla is negative for lymphadenopathy. IMPRESSION: Suspicious irregular mass 9 o'clock position right breast. RECOMMENDATION: Ultrasound-guided core needle biopsy is recommended and will be scheduled for the patient. I have discussed the findings and recommendations with the patient. If applicable, a reminder letter will be sent to the patient regarding the next appointment. BI-RADS CATEGORY  4: Suspicious. Electronically Signed   By: Curlene Dolphin M.D.   On: 03/13/2019 12:14   Mm Diag  Breast Tomo Uni Right  Result Date: 03/13/2019 CLINICAL DATA:  68 year old patient recalled from recent screening for evaluation of possible asymmetry in the right breast. EXAM: DIGITAL DIAGNOSTIC RIGHT MAMMOGRAM WITH CAD AND TOMO ULTRASOUND RIGHT BREAST COMPARISON:  March 05, 2019 ACR Breast Density Category b: There are scattered areas of  fibroglandular density. FINDINGS: Additional mammographic views performed today confirm an irregular mass in the 9 o'clock axis of the right breast, middle third. Mammographic images were processed with CAD. On physical exam, there is a subtle mass/thickening in the 9 o'clock position of the right breast 4-5 cm from the nipple. Targeted ultrasound is performed, showing a hypoechoic irregular mass with indistinct margins. The mass measures approximately 1.3 x 1.0 x 0.8 cm and is at 9 o'clock position 4-5 cm from the nipple. Ultrasound of the right axilla is negative for lymphadenopathy. IMPRESSION: Suspicious irregular mass 9 o'clock position right breast. RECOMMENDATION: Ultrasound-guided core needle biopsy is recommended and will be scheduled for the patient. I have discussed the findings and recommendations with the patient. If applicable, a reminder letter will be sent to the patient regarding the next appointment. BI-RADS CATEGORY  4: Suspicious. Electronically Signed   By: Curlene Dolphin M.D.   On: 03/13/2019 12:14   Mm Clip Placement Right  Result Date: 03/16/2019 CLINICAL DATA:  Post biopsy mammogram of the right breast for clip placement. EXAM: DIAGNOSTIC RIGHT MAMMOGRAM POST ULTRASOUND BIOPSY COMPARISON:  Previous exam(s). FINDINGS: Mammographic images were obtained following ultrasound guided biopsy of a right breast mass at 9 o'clock. The ribbon shaped biopsy marking clip is well positioned at the site of biopsy in the lateral right breast. IMPRESSION: Appropriate positioning of the ribbon shaped biopsy marking clip in the lateral right breast Final  Assessment: Post Procedure Mammograms for Marker Placement Electronically Signed   By: Ammie Ferrier M.D.   On: 03/16/2019 14:42   [place] Rt Breast Bx W Loc Dev 1st Lesion Img Bx Spec US Guide  Addendum Date: 03/17/2019   ADDENDUM REPORT: 03/17/2019 11:08 ADDENDUM: Pathology revealed GRADE II INVASIVE DUCTAL CARCINOMA WITH CALCIFICATIONS of the Right breast, 9 o'clock. This was found to be concordant by Dr. Ammie Ferrier. Pathology results were discussed with the patient by telephone. The patient reported doing well after the biopsy with tenderness at the site. Post biopsy instructions and care were reviewed and questions were answered. The patient was encouraged to call [facility] for any additional concerns. Surgical consultation has been arranged with Dr. Nedra Hai at [facility] on March 24, 2019. Pathology results reported by Terie Purser, RN on 03/17/2019. Electronically Signed   By: Ammie Ferrier M.D.   On: 03/17/2019 11:08   Result Date: 03/17/2019 CLINICAL DATA:  68 year old female presenting for ultrasound-guided biopsy of a right breast mass. EXAM: ULTRASOUND GUIDED RIGHT BREAST CORE NEEDLE BIOPSY COMPARISON:  Previous exam(s). FINDINGS: I met with the patient and we discussed the procedure of ultrasound-guided biopsy, including benefits and alternatives. We discussed the high likelihood of a successful procedure. We discussed the risks of the procedure, including infection, bleeding, tissue injury, clip migration, and inadequate sampling. Informed written consent was given. The usual time-out protocol was performed immediately prior to the procedure. Lesion quadrant: Lower outer quadrant Using sterile technique and 1% Lidocaine as local anesthetic, under direct ultrasound visualization, a 14 gauge spring-loaded device was used to perform biopsy of a mass in the right breast at 9 o'clock using an inferior approach. At the conclusion of the  procedure a ribbon shaped tissue marker clip was deployed into the biopsy cavity. Follow up 2 view mammogram was performed and dictated separately. IMPRESSION: Ultrasound guided biopsy of a right breast mass at 9 o'clock. No apparent complications. Electronically Signed: By: Ammie Ferrier M.D. On: 03/16/2019 14:26  IMPRESSION: Stage cT1c, cN0 Right Breast UOQ, Invasive Ductal Carcinoma, ER+ / PR+ / Her2+, Grade 2  Patient would appear to be a good candidate for breast conservation with lumpectomy, sentinel node procedure and adjuvant radiation therapy.  I discussed the general course of treatment side effects and potential toxicities of radiation therapy in the situation with the patient.  She appears to understand and wishes to proceed with breast conserving surgery followed by adjuvant radiation therapy.  I also discussed with patient given her Her2+ tumor she will likely be recommended for adjuvant chemotherapy which will take place prior to her radiation therapy.  Patient will meet with medical oncology in the next several days to discuss adjuvant therapy from their perspective.    PLAN: Patient will proceed with surgery on October 19 with Dr. Ninfa Norman.  The patient will be seen in the postoperative setting approximately 2 weeks later for further evaluation for radiation therapy.  Treatment likely to proceed after adjuvant chemotherapy.    ------------------------------------------------  Blair Promise, PhD, MD  This document serves as a record of services personally performed by Gery Pray, MD. It was created on his behalf by Wilburn Mylar, a trained medical scribe. The creation of this record is based on the scribe's personal observations and the provider's statements to them. This document has been checked and approved by the attending provider.

## 2019-04-01 NOTE — Progress Notes (Signed)
Patient ID: Margaret Norman, female   DOB: Mar 29, 1951, 68 y.o.   MRN: [id number]   HPI: Margaret Norman is a 68 y.o.-year-old female, returning for follow-up for DM2, dx in 2008, insulin-dependent since 2016, uncontrolled, without long term complications.  Last visit 3.5 months ago.  She just found out last week she has Breast cancer.  She has an appointment with oncology later today.  She continues to have a lot of stress at home with her grandson who 55, is bipolar, and was laid off from work - who is living with her and her husband along with patient's daughter.  Reviewed HbA1c levels: Lab Results  Component Value Date   HGBA1C 7.1 (A) 12/17/2018   HGBA1C 6.8 (A) 01/10/2018   HGBA1C 8.7 08/13/2017  03/19/2017: HbA1c calculated from fructosamine is 6.7%! 12/31/2016: HbA1c calculated from fructosamine: 6.9%!  Pt is on: - Metformin 1000 mg 2x a day with meals - Lantus 30 units at night - Trulicity 1.5 mg weekly - Jardiance 25 mg daily before breakfast Stopped Actos when started trulicity back. She was on Trulicity but became expensive >> changed to Actos. She was on Invokana >> recurrent UTIs.  Pt checks her sugars 1-3 a day: - am:  98-122 >> 129 >> 112-201 >> 115, 123, 138 >> 87-146, 192, 207 - 2h after b'fast: 140s >> n/c >> >> 147-182 >> 113-163, 208 - before lunch: n/c >> 96-141 >> 186 >> 128-174 >> 199 - 2h after lunch: n/c >> 186- 194 >> 161 - before dinner: n/c >> 163, 168 - 2h after dinner: n/c >> 292 - bedtime: 257 x1 >> 234 >> n/c >> 118, 125 - nighttime: n/c >> 172 >> 178 >> n/c Lowest sugar was 96 >> 112 >> 115 >> 87; she has hypoglycemia awareness in the 70s. Highest sugar was 234 >> 201 >> 182 >> 292.  Glucometer: OneTouch Verio  -No CKD, last BUN/creatinine:  Lab Results  Component Value Date   BUN 25 (H) 02/02/2019   BUN 19 05/19/2018   CREATININE 0.87 02/02/2019   CREATININE 0.97 05/19/2018  On olmesartan. -+ HL; last set of lipids: Lab Results   Component Value Date   CHOL 101 02/02/2019   HDL 47.40 02/02/2019   LDLCALC 38 02/02/2019   LDLDIRECT 137.9 02/04/2013   TRIG 78.0 02/02/2019   CHOLHDL 2 02/02/2019  On Crestor 10. - last eye exam was in 06/2018: No DR, + cataract in left eye.  Dr. Marica Otter. - no numbness and tingling in her feet.  She has restless leg syndrome.  Pt has FH of DM in MGM.  ROS: Constitutional: no weight gain/no weight loss, no fatigue, no subjective hyperthermia, no subjective hypothermia Eyes: no blurry vision, no xerophthalmia ENT: no sore throat, no nodules palpated in neck, no dysphagia, no odynophagia, no hoarseness Cardiovascular: no CP/no SOB/no palpitations/no leg swelling Respiratory: no cough/no SOB/no wheezing Gastrointestinal: no N/no V/no D/+ C/no acid reflux Musculoskeletal: no muscle aches/no joint aches Skin: no rashes, no hair loss Neurological: no tremors/no numbness/no tingling/no dizziness  I reviewed pt's medications, allergies, PMH, social hx, family hx, and changes were documented in the history of present illness. Otherwise, unchanged from my initial visit note.  Past Medical History:  Diagnosis Date  . Arthritis   . Chest pain    Nuclear, adenosine,  December, 2013, low risk nuclear scan with small, moderate in intensity, fixed anteroseptal defect. This is possibly related to an LBBB versus small prior infarct. No ischemia  .  Depression   . Gallstones 11-06  . GERD (gastroesophageal reflux disease)   . HTN (hypertension)   . Hyperlipidemia   . Kidney mass 01/28/2014  . LBBB (left bundle branch block)   . Nephrolithiasis   . Pneumonia   . 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 Type II or unspecified type diabetes mellitus without mention of complication, not stated as uncontrolled    Past Surgical History:  Procedure Laterality Date  . CESAREAN SECTION     x2 ? w/appy  . CHOLECYSTECTOMY    . ESOPHAGOGASTRODUODENOSCOPY    . HERNIA REPAIR    .  KNEE ARTHROSCOPY Right 11/2005  . LIVER BIOPSY    . PANCREATIC CYST EXCISION    . SPLENECTOMY    . TONSILLECTOMY AND ADENOIDECTOMY     Social History   Socioeconomic History  . Marital status: Married    Spouse name: Tajai Suder  . Number of children: 2  . Years of education: Not on file  . Highest education level: Not on file  Occupational History  . Occupation: CSR    Employer: [employer]  . Occupation: retired  Scientific laboratory technician  . Financial resource strain: Not very hard  . Food insecurity    Worry: Never true    Inability: Never true  . Transportation needs    Medical: No    Non-medical: No  Tobacco Use  . Smoking status: Never Smoker  . Smokeless tobacco: Never Used  Substance and Sexual Activity  . Alcohol use: No  . Drug use: No  . Sexual activity: Not Currently  Lifestyle  . Physical activity    Days per week: 0 days    Minutes per session: 0 min  . Stress: Not at all  Relationships  . Social connections    Talks on phone: More than three times a week    Gets together: More than three times a week    Attends religious service: Not on file    Active member of club or organization: Not on file    Attends meetings of clubs or organizations: Not on file    Relationship status: Married  . Intimate partner violence    Fear of current or ex partner: No    Emotionally abused: No    Physically abused: No    Forced sexual activity: No  Other Topics Concern  . Not on file  Social History Narrative   Regular Exercise -  NO   Current Outpatient Medications on File Prior to Visit  Medication Sig Dispense Refill  . aspirin 81 MG EC tablet Take 81 mg by mouth daily.      . Blood Glucose Monitoring Suppl (ONETOUCH VERIO FLEX SYSTEM) W/DEVICE KIT 1 kit by Does not apply route 3 (three) times daily. 1 kit 1  . buPROPion (WELLBUTRIN XL) 150 MG 24 hr tablet Take 1 tablet (150 mg total) by mouth every morning. 90 tablet 3  . calcium carbonate (OSCAL) 1500 (600 Ca)  MG TABS tablet Take 1,500 mg by mouth 2 (two) times daily with a meal.    . Cyanocobalamin (VITAMIN B12 PO) Take 2,500 mcg by mouth daily.    Marland Kitchen docusate sodium (COLACE) 100 MG capsule Take 100 mg by mouth daily.    . empagliflozin (JARDIANCE) 25 MG TABS tablet Take 25 mg by mouth daily. 30 tablet 11  . fluticasone (FLONASE) 50 MCG/ACT nasal spray Place 2 sprays into both nostrils daily. 16 g 6  . glucose blood (ONETOUCH  VERIO) test strip Test three times daily as directed. DX: E11.9 300 each 3  . Insulin Pen Needle (B-D ULTRAFINE III SHORT PEN) 31G X 8 MM MISC USE DAILY WITH LANTUS  INSULIN 90 each 1  . LANTUS SOLOSTAR 100 UNIT/ML Solostar Pen INJECT SUBCUTANEOUSLY 30  UNITS AT BEDTIME 30 mL 3  . meloxicam (MOBIC) 15 MG tablet     . metFORMIN (GLUCOPHAGE) 1000 MG tablet TAKE 1 TABLET BY MOUTH  TWICE DAILY WITH MEALS 180 tablet 3  . olmesartan (BENICAR) 40 MG tablet Take 1 tablet (40 mg total) by mouth daily. 90 tablet 3  . omeprazole (PRILOSEC) 40 MG capsule Take 1 capsule (40 mg total) by mouth daily. 90 capsule 3  . ONE TOUCH LANCETS MISC Test three times daily as directed. DX: E11.9 300 each 3  . Probiotic Product (PROBIOTIC ADVANCED PO) Take 1 tablet by mouth daily.    . Pyridoxine HCl (VITAMIN B-6 PO) Take 1 tablet by mouth daily.     . rosuvastatin (CRESTOR) 10 MG tablet Take 1 tablet (10 mg total) by mouth daily. 90 tablet 3  . sertraline (ZOLOFT) 100 MG tablet Take 1 tablet (100 mg total) by mouth daily. 90 tablet 3  . TRULICITY 1.5 PJ/8.2NK SOPN INJECT THE CONTENTS OF 1  PEN SUBCUTANEOUSLY WEEKLY  AS DIRECTED 6 mL 2   No current facility-administered medications on file prior to visit.    Allergies  Allergen Reactions  . Invokana [Canagliflozin] Other (See Comments)    UTI's  . Amoxicillin     Sore tongue   Family History  Problem Relation Age of Onset  . Liver disease Mother   . Dementia Mother   . Diabetes Mother        borderline  . Coronary artery disease Father   .  Heart attack Father   . Hypertension Father   . Heart disease Father   . Cancer Other        leukemia  . Stroke Maternal Grandfather   . Hyperlipidemia Brother        Amyloidosis  . Diabetes Maternal Grandmother   . Cancer Paternal Uncle        unknown  . Heart attack Paternal Grandmother   . Heart attack Paternal Uncle   . Hypertension Brother   . Diabetes Daughter        borderline  . Colon cancer Neg Hx     PE: BP 140/88   Pulse 87   Ht 5' 1.75" (1.568 m) Comment: measured today without shoes  Wt 153 lb (69.4 kg)   SpO2 96%   BMI 28.21 kg/m   Wt Readings from Last 3 Encounters:  04/01/19 153 lb (69.4 kg)  03/23/19 151 lb (68.5 kg)  02/02/19 152 lb (68.9 kg)   Constitutional: overweight, in NAD Eyes: PERRLA, EOMI, no exophthalmos ENT: moist mucous membranes, no thyromegaly, no cervical lymphadenopathy Cardiovascular: RRR, No MRG Respiratory: CTA B Gastrointestinal: abdomen soft, NT, ND, BS+ Musculoskeletal: no deformities, strength intact in all 4 Skin: moist, warm, no rashes Neurological: no tremor with outstretched hands, DTR normal in all 4   ASSESSMENT: 1. DM2, insulin-dependent, uncontrolled, without long term complications, but with hyperglycemia  2. HL  3.  Overweight  PLAN:  1. Patient with longstanding, uncontrolled, type 2 diabetes, on oral antidiabetic regimen, long-acting insulin, and weekly GLP-1 receptor agonist.  Latest HbA1c was 7.1% 3.5 months ago, slightly higher than before.  At last visit, sugars were at or slightly above goal  due to frequent snacking and disorganized meal.  We discussed about stopping snacking between meals and having more consistent mealtimes and also to reduce fat in her diet and stopping processed foods.  We did not change her regimen at that time. -At this visit, he tells me that she tried to increase her transaminases levels, but is still eating out a lot and eating sweets.  Sugars remain variable, when the majority of  them at or close to goal, interspersed with hyperglycemic spikes.  Unfortunately, we do not have a lot of therapeutic options to just cover these spikes and the best way to proceed is to improve her diet.  However, since she is not willing to change this for now, I advised her to increase the Ozempic to the newly FDA approved dose of 30 mg daily.  For now, she was double up on her weekly dose until the 3 mg dose will become available to the pharmacies - I suggested to:  Patient Instructions  Please  continue: - Metformin 1000 mg 2x a day with meals - Lantus 30 units at night - Jardiance 25 mg daily before breakfast  Increase: - Trulicity to 3 mg weekly  Please return in 3-4 months with your sugar log.   - we checked her HbA1c: 7.4% (higher) - advised to check sugars at different times of the day - 1-2x a day, rotating check times - advised for yearly eye exams >> she is UTD - UTD wth flu shot - return to clinic in 3-4 months     2. HL -Reviewed latest lipid panel from 04/2018: All fractions at goal Lab Results  Component Value Date   CHOL 101 02/02/2019   HDL 47.40 02/02/2019   LDLCALC 38 02/02/2019   LDLDIRECT 137.9 02/04/2013   TRIG 78.0 02/02/2019   CHOLHDL 2 02/02/2019  -Continues the statin without side effects.  3.  Overweight -no significant weight loss since last visit -Continue Trulicity and Jardiance, which would also help with weight loss.  Increasing Trulicity to will also help with weight loss  Philemon Kingdom, MD PhD [facility] Endocr[facility]

## 2019-04-01 NOTE — Addendum Note (Signed)
Addended by: Cardell Peach I on: 04/01/2019 11:15 AM   Modules accepted: Orders

## 2019-04-06 NOTE — Progress Notes (Signed)
[place] NOTE  Patient Care Team: [name], [place] as PCP - General (Internal Medicine) [name], [name], MD as PCP - Cardiology (Cardiology) [name], RN as Oncology Nurse Navigator [name], RN as Oncology Nurse Navigator  CHIEF COMPLAINTS/PURPOSE OF CONSULTATION:  Newly diagnosed breast cancer  HISTORY OF PRESENTING ILLNESS:  Margaret Norman 68 y.o. female is here because of recent diagnosis of invasive ductal carcinoma of the right breast. The cancer was detected on a routine screening mammogram and was palpable on exam. Diagnostic mammogram and US of the right breast on 03/13/19 showed a 1.3cm mass in the right breast at the 9 o'clock position with no evidence of axillary adenopathy. Biopsy on 03/16/19 showed invasive ductal carcinoma with calcifications, grade 2, HER-2 positive (3+), ER+ 100%, PR+ 3%, Ki67 35%. She presents to the clinic today for  Initial evaluation and discussion of treatment options.   I reviewed her records extensively and collaborated the history with the patient.  SUMMARY OF ONCOLOGIC HISTORY: Oncology History  Malignant neoplasm of upper-outer quadrant of right breast in female, estrogen receptor positive ([place])  03/16/2019 Cancer Staging   Staging form: Breast, AJCC 8th Edition - Clinical stage from 03/16/2019: Stage IA (cT1c, cN0, cM0, G2, ER+, PR+, HER2+) - Signed by [name], NP on 03/25/2019   03/25/2019 Initial Diagnosis   Routine screening mammogram detected a 1.3cm mass in the right breast, 9 o'clock position, no axillary adenopathy. 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       MEDICAL HISTORY:  Past Medical History:  Diagnosis Date  . Arthritis   . Chest pain    Nuclear, adenosine,  December, 2013, low risk nuclear scan with small, moderate in intensity, fixed anteroseptal defect. This is possibly related to an LBBB versus small  prior infarct. No ischemia  . Depression   . Gallstones 11-06  . GERD (gastroesophageal reflux disease)   . HTN (hypertension)   . Hyperlipidemia   . Kidney mass 01/28/2014  . LBBB (left bundle branch block)   . Nephrolithiasis   . Pneumonia   . 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 Type II or unspecified type diabetes mellitus without mention of complication, not stated as uncontrolled     SURGICAL HISTORY: Past Surgical History:  Procedure Laterality Date  . CESAREAN SECTION     x2 ? w/appy  . CHOLECYSTECTOMY    . ESOPHAGOGASTRODUODENOSCOPY    . HERNIA REPAIR    . KNEE ARTHROSCOPY Right 11/2005  . LIVER BIOPSY    . PANCREATIC CYST EXCISION    . SPLENECTOMY    . TONSILLECTOMY AND ADENOIDECTOMY      SOCIAL HISTORY: Social History   Socioeconomic History  . Marital status: Married    Spouse name: Aya Geisel  . Number of children: 2  . Years of education: Not on file  . Highest education level: Not on file  Occupational History  . Occupation: CSR    Employer: [employer]  . Occupation: retired  Scientific laboratory technician  . Financial resource strain: Not very hard  . Food insecurity    Worry: Never true    Inability: Never true  . Transportation needs    Medical: No    Non-medical: No  Tobacco Use  . Smoking status: Never Smoker  . Smokeless tobacco: Never Used  Substance and Sexual Activity  . Alcohol use: No  . Drug use:  No  . Sexual activity: Not Currently  Lifestyle  . Physical activity    Days per week: 0 days    Minutes per session: 0 min  . Stress: Not at all  Relationships  . Social connections    Talks on phone: More than three times a week    Gets together: More than three times a week    Attends religious service: Not on file    Active member of club or organization: Not on file    Attends meetings of clubs or organizations: Not on file    Relationship status: Married  . Intimate partner violence    Fear of current or ex partner:  No    Emotionally abused: No    Physically abused: No    Forced sexual activity: No  Other Topics Concern  . Not on file  Social History Narrative   Regular Exercise -  NO    FAMILY HISTORY: Family History  Problem Relation Age of Onset  . Liver disease Mother   . Dementia Mother   . Diabetes Mother        borderline  . Coronary artery disease Father   . Heart attack Father   . Hypertension Father   . Heart disease Father   . Cancer Other        leukemia  . Stroke Maternal Grandfather   . Hyperlipidemia Brother        Amyloidosis  . Diabetes Maternal Grandmother   . Cancer Paternal Uncle        unknown  . Heart attack Paternal Grandmother   . Heart attack Paternal Uncle   . Hypertension Brother   . Diabetes Daughter        borderline  . Colon cancer Neg Hx     ALLERGIES:  is allergic to invokana [canagliflozin] and amoxicillin.  MEDICATIONS:  Current Outpatient Medications  Medication Sig Dispense Refill  . acetaminophen (TYLENOL) 500 MG tablet Take 1,000 mg by mouth every 6 (six) hours as needed for moderate pain.    Marland Kitchen aspirin 81 MG EC tablet Take 81 mg by mouth daily.      . Blood Glucose Monitoring Suppl (ONETOUCH VERIO FLEX SYSTEM) W/DEVICE KIT 1 kit by Does not apply route 3 (three) times daily. 1 kit 1  . buPROPion (WELLBUTRIN XL) 150 MG 24 hr tablet Take 1 tablet (150 mg total) by mouth every morning. 90 tablet 3  . Cyanocobalamin (VITAMIN B12 PO) Take 2,500 mcg by mouth daily.    . empagliflozin (JARDIANCE) 25 MG TABS tablet Take 25 mg by mouth daily. 30 tablet 11  . fluticasone (FLONASE) 50 MCG/ACT nasal spray Place 2 sprays into both nostrils daily. (Patient not taking: Reported on 04/07/2019) 16 g 6  . glucose blood (ONETOUCH VERIO) test strip Test three times daily as directed. DX: E11.9 300 each 3  . Insulin Pen Needle (B-D ULTRAFINE III SHORT PEN) 31G X 8 MM MISC USE DAILY WITH LANTUS  INSULIN 90 each 1  . LANTUS SOLOSTAR 100 UNIT/ML Solostar Pen  INJECT SUBCUTANEOUSLY 30  UNITS AT BEDTIME (Patient taking differently: Inject 30 Units into the skin at bedtime. ) 30 mL 3  . meloxicam (MOBIC) 15 MG tablet Take 15 mg by mouth daily.     . metFORMIN (GLUCOPHAGE) 1000 MG tablet TAKE 1 TABLET BY MOUTH  TWICE DAILY WITH MEALS (Patient taking differently: Take 1,000 mg by mouth 2 (two) times daily with a meal. ) 180 tablet 3  .  olmesartan (BENICAR) 40 MG tablet Take 1 tablet (40 mg total) by mouth daily. 90 tablet 3  . omeprazole (PRILOSEC) 40 MG capsule Take 1 capsule (40 mg total) by mouth daily. 90 capsule 3  . ONE TOUCH LANCETS MISC Test three times daily as directed. DX: E11.9 300 each 3  . Probiotic Product (PROBIOTIC ADVANCED PO) Take 1 tablet by mouth at bedtime.     . pyridOXINE (VITAMIN B-6) 100 MG tablet Take 100 mg by mouth daily.     . rosuvastatin (CRESTOR) 10 MG tablet Take 1 tablet (10 mg total) by mouth daily. 90 tablet 3  . sertraline (ZOLOFT) 100 MG tablet Take 1 tablet (100 mg total) by mouth daily. 90 tablet 3  . TRULICITY 1.5 YK/9.9IP SOPN INJECT THE CONTENTS OF 1  PEN SUBCUTANEOUSLY WEEKLY  AS DIRECTED (Patient taking differently: Take 3 mg by mouth every 7 (seven) days. ) 6 mL 2  . Vitamin E 180 MG CAPS Take 180 mg by mouth daily.     No current facility-administered medications for this visit.     REVIEW OF SYSTEMS:   Constitutional: Denies fevers, chills or abnormal night sweats Eyes: Denies blurriness of vision, double vision or watery eyes Ears, nose, mouth, throat, and face: Denies mucositis or sore throat Respiratory: Denies cough, dyspnea or wheezes Cardiovascular: Denies palpitation, chest discomfort or lower extremity swelling Gastrointestinal:  Denies nausea, heartburn or change in bowel habits Skin: Denies abnormal skin rashes Lymphatics: Denies new lymphadenopathy or easy bruising Neurological:Denies numbness, tingling or new weaknesses Behavioral/Psych: Mood is stable, no new changes  Breast: Palpable  right breast mass All other systems were reviewed with the patient and are negative.  PHYSICAL EXAMINATION: ECOG PERFORMANCE STATUS: 1 - Symptomatic but completely ambulatory  Vitals:   04/07/19 1538  BP: 132/78  Pulse: 94  Resp: 18  Temp: 98 F (36.7 C)  SpO2: 98%   Filed Weights   04/07/19 1538  Weight: 155 lb 8 oz (70.5 kg)    GENERAL:alert, no distress and comfortable SKIN: skin color, texture, turgor are normal, no rashes or significant lesions EYES: normal, conjunctiva are pink and non-injected, sclera clear OROPHARYNX:no exudate, no erythema and lips, buccal mucosa, and tongue normal  NECK: supple, thyroid normal size, non-tender, without nodularity LYMPH:  no palpable lymphadenopathy in the cervical, axillary or inguinal LUNGS: clear to auscultation and percussion with normal breathing effort HEART: regular rate & rhythm and no murmurs and no lower extremity edema ABDOMEN:abdomen soft, non-tender and normal bowel sounds Musculoskeletal:no cyanosis of digits and no clubbing  PSYCH: alert & oriented x 3 with fluent speech NEURO: no focal motor/sensory deficits   LABORATORY DATA:  I have reviewed the data as listed Lab Results  Component Value Date   WBC 15.5 (H) 02/02/2019   HGB 12.1 02/02/2019   HCT 36.8 02/02/2019   MCV 89.0 02/02/2019   PLT 416.0 (H) 02/02/2019   Lab Results  Component Value Date   NA 142 02/02/2019   K 4.2 02/02/2019   CL 107 02/02/2019   CO2 26 02/02/2019    RADIOGRAPHIC STUDIES: I have personally reviewed the radiological reports and agreed with the findings in the report.  ASSESSMENT AND PLAN:  Malignant neoplasm of upper-outer quadrant of right breast in female, estrogen receptor positive ([place]) Routine screening mammogram detected a 1.3cm mass in the right breast, 9 o'clock position, no axillary adenopathy. 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  T1c  N0 stage Ia  Pathology and  radiology counseling:Discussed with the patient, the details of pathology including the type of breast cancer,the clinical staging, the significance of ER, PR and HER-2/neu receptors and the implications for treatment. After reviewing the pathology in detail, we proceeded to discuss the different treatment options between surgery, radiation, chemotherapy, antiestrogen therapies.  Recommendations: 1. Breast conserving surgery followed by 2. Adj chemo with Taxol-Herceptin foll by Herceptin maintenance for 1 year 3. Adjuvant radiation therapy followed by 4. Adjuvant antiestrogen therapy  I talked to her about UPBEAT and SWOG 1714 trials. Will give her more info after surgery. RTC after surgery to discuss the final pathology results on 10/28   All questions were answered. The patient knows to call the clinic with any problems, questions or concerns.    Rulon Eisenmenger, MD, MPH 04/07/2019    I, Molly Dorshimer, am acting as scribe for Nicholas Lose, MD.  I have reviewed the above documentation for accuracy and completeness, and I agree with the above.

## 2019-04-07 ENCOUNTER — Other Ambulatory Visit: Payer: Self-pay

## 2019-04-07 ENCOUNTER — Inpatient Hospital Stay: Payer: Medicare Other | Attending: Hematology and Oncology | Admitting: Hematology and Oncology

## 2019-04-07 DIAGNOSIS — Z170 Estrogen receptor positive status [ER+]: Secondary | ICD-10-CM | POA: Diagnosis not present

## 2019-04-07 DIAGNOSIS — C50411 Malignant neoplasm of upper-outer quadrant of right female breast: Secondary | ICD-10-CM

## 2019-04-07 DIAGNOSIS — C50811 Malignant neoplasm of overlapping sites of right female breast: Secondary | ICD-10-CM | POA: Insufficient documentation

## 2019-04-07 NOTE — Assessment & Plan Note (Addendum)
Routine screening mammogram detected a 1.3cm mass in the right breast, 9 o'clock position, no axillary adenopathy. 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  T1c N0 stage Ia  Pathology and radiology counseling:Discussed with the patient, the details of pathology including the type of breast cancer,the clinical staging, the significance of ER, PR and HER-2/neu receptors and the implications for treatment. After reviewing the pathology in detail, we proceeded to discuss the different treatment options between surgery, radiation, chemotherapy, antiestrogen therapies.  Recommendations: 1. Breast conserving surgery followed by 2. Adj chemo with Taxol-Herceptin foll by Herceptin maintenance for 1 year 3. Adjuvant radiation therapy followed by 4. Adjuvant antiestrogen therapy  RTC after surgery to discuss the final pathology results

## 2019-04-08 ENCOUNTER — Telehealth: Payer: Self-pay | Admitting: Hematology and Oncology

## 2019-04-08 ENCOUNTER — Encounter: Payer: Self-pay | Admitting: *Deleted

## 2019-04-08 ENCOUNTER — Other Ambulatory Visit: Payer: Self-pay | Admitting: Family

## 2019-04-08 DIAGNOSIS — K219 Gastro-esophageal reflux disease without esophagitis: Secondary | ICD-10-CM

## 2019-04-08 NOTE — Telephone Encounter (Signed)
I talk with patient regarding 10/28  °

## 2019-04-09 NOTE — Progress Notes (Signed)
[facility] - [place], [place] - [address] [place] [address] Phone: [phone] Fax: [phone]  [place], [place] [address] [place] [address] Phone: [phone] Fax: [phone]      Your procedure is scheduled on April 15, 2019.  Report to [facility] Main Entrance "A" at 9:15 A.M., and check in at the Admitting office.  Call this number if you have problems the morning of surgery:  [phone]  Call [phone] if you have any questions prior to your surgery date Monday-Friday 8am-4pm    Remember:  Do not eat  after midnight the night before your surgery  You may drink clear liquids until 8:15 the morning of your surgery.   Clear liquids allowed are: Water, Non-Citrus Juices (without pulp), Carbonated Beverages, Clear Tea, Black Coffee Only, and Gatorade    Take these medicines the morning of surgery with A SIP OF WATER: buPROPion (WELLBUTRIN XL) omeprazole (PRILOSEC)  rosuvastatin (CRESTOR) sertraline (ZOLOFT)  acetaminophen (TYLENOL)-if needed   WHAT DO I DO ABOUT MY DIABETES MEDICATION?   Marland Kitchen Do not take oral diabetes medicines (pills) the morning of surgery.  . THE NIGHT BEFORE SURGERY, DO NOT take Insulin glargine (Lantus)       . The day of surgery, do not take other diabetes injectables, including Byetta (exenatide), Bydureon (exenatide ER), Victoza (liraglutide), or Trulicity (dulaglutide).  . If your CBG is greater than 220 mg/dL, you may take  of your sliding scale (correction) dose of insulin.   How to Manage Your Diabetes Before and After Surgery  Why is it important to control my blood sugar before and after surgery? . Improving blood sugar levels before and after surgery helps healing and can limit problems. . A way of improving blood sugar control is eating a healthy diet by: o  Eating less sugar and  carbohydrates o  Increasing activity/exercise o  Talking with your doctor about reaching your blood sugar goals . High blood sugars (greater than 180 mg/dL) can raise your risk of infections and slow your recovery, so you will need to focus on controlling your diabetes during the weeks before surgery. . Make sure that the doctor who takes care of your diabetes knows about your planned surgery including the date and location.  How do I manage my blood sugar before surgery? . Check your blood sugar at least 4 times a day, starting 2 days before surgery, to make sure that the level is not too high or low. o Check your blood sugar the morning of your surgery when you wake up and every 2 hours until you get to the Short Stay unit. . If your blood sugar is less than 70 mg/dL, you will need to treat for low blood sugar: o Do not take insulin. o Treat a low blood sugar (less than 70 mg/dL) with  cup of clear juice (cranberry or apple), 4 glucose tablets, OR glucose gel. o Recheck blood sugar in 15 minutes after treatment (to make sure it is greater than 70 mg/dL). If your blood sugar is not greater than 70 mg/dL on recheck, call [phone] for further instructions. . Report your blood sugar to the short stay nurse when you get to Short Stay.  . If you are admitted to the hospital after surgery: o Your blood sugar will be checked by the staff and you will probably be given insulin  after surgery (instead of oral diabetes medicines) to make sure you have good blood sugar levels. o The goal for blood sugar control after surgery is 80-180 mg/dL.       AS of today, STOP taking any Aspirin (unless otherwise instructed by your surgeon), Aleve, Naproxen, Ibuprofen, Motrin, Advil, Goody's, BC's, all herbal medications, fish oil, and all vitamins.    The Morning of Surgery  Do not wear jewelry, make-up or nail polish.  Do not wear lotions, powders, or perfumes or deodorant  Do not shave 48 hours prior  to surgery.    Do not bring valuables to the hospital.  [facility] is not responsible for any belongings or valuables.  If you are a smoker, DO NOT Smoke 24 hours prior to surgery IF you wear a CPAP at night please bring your mask, tubing, and machine the morning of surgery   Remember that you must have someone to transport you home after your surgery, and remain with you for 24 hours if you are discharged the same day.   Contacts, glasses, hearing aids, dentures or bridgework may not be worn into surgery.    Leave your suitcase in the car.  After surgery it may be brought to your room.  For patients admitted to the hospital, discharge time will be determined by your treatment team.  Patients discharged the day of surgery will not be allowed to drive home.    Special instructions:   [place]- Preparing For Surgery  Before surgery, you can play an important role. Because skin is not sterile, your skin needs to be as free of germs as possible. You can reduce the number of germs on your skin by washing with CHG (chlorahexidine gluconate) Soap before surgery.  CHG is an antiseptic cleaner which kills germs and bonds with the skin to continue killing germs even after washing.    Oral Hygiene is also important to reduce your risk of infection.  Remember - BRUSH YOUR TEETH THE MORNING OF SURGERY WITH YOUR REGULAR TOOTHPASTE  Please do not use if you have an allergy to CHG or antibacterial soaps. If your skin becomes reddened/irritated stop using the CHG.  Do not shave (including legs and underarms) for at least 48 hours prior to first CHG shower. It is OK to shave your face.  Please follow these instructions carefully.   1. Shower the NIGHT BEFORE SURGERY and the MORNING OF SURGERY with CHG Soap.   2. If you chose to wash your hair, wash your hair first as usual with your normal shampoo.  3. After you shampoo, rinse your hair and body thoroughly to remove the shampoo.  4. Use CHG  as you would any other liquid soap. You can apply CHG directly to the skin and wash gently with a scrungie or a clean washcloth.   5. Apply the CHG Soap to your body ONLY FROM THE NECK DOWN.  Do not use on open wounds or open sores. Avoid contact with your eyes, ears, mouth and genitals (private parts). Wash Face and genitals (private parts)  with your normal soap.   6. Wash thoroughly, paying special attention to the area where your surgery will be performed.  7. Thoroughly rinse your body with warm water from the neck down.  8. DO NOT shower/wash with your normal soap after using and rinsing off the CHG Soap.  9. Pat yourself dry with a CLEAN TOWEL.  10. Wear CLEAN PAJAMAS to bed the night before surgery, wear  comfortable clothes the morning of surgery  11. Place CLEAN SHEETS on your bed the night of your first shower and DO NOT SLEEP WITH PETS.    Day of Surgery:  Do not apply any deodorants/lotions. Please shower the morning of surgery with the CHG soap  Please wear clean clothes to the hospital/surgery center.   Remember to brush your teeth WITH YOUR REGULAR TOOTHPASTE.   Please read over the following fact sheets that you were given.

## 2019-04-10 ENCOUNTER — Encounter (HOSPITAL_COMMUNITY): Payer: Self-pay

## 2019-04-10 ENCOUNTER — Other Ambulatory Visit: Payer: Self-pay | Admitting: Surgery

## 2019-04-10 ENCOUNTER — Other Ambulatory Visit: Payer: Self-pay

## 2019-04-10 ENCOUNTER — Encounter (HOSPITAL_COMMUNITY)
Admission: RE | Admit: 2019-04-10 | Discharge: 2019-04-10 | Disposition: A | Discharge status: Home / Self Care | Payer: Medicare Other | Source: Ambulatory Visit | Attending: Surgery | Admitting: Surgery

## 2019-04-10 DIAGNOSIS — E119 Type 2 diabetes mellitus without complications: Secondary | ICD-10-CM | POA: Diagnosis not present

## 2019-04-10 DIAGNOSIS — Z01818 Encounter for other preprocedural examination: Secondary | ICD-10-CM | POA: Insufficient documentation

## 2019-04-10 HISTORY — DX: Personal history of urinary calculi: Z87.442

## 2019-04-10 LAB — BASIC METABOLIC PANEL
Anion gap: 10 (ref 5–15)
BUN: 20 mg/dL (ref 8–23)
CO2: 24 mmol/L (ref 22–32)
Calcium: 9.4 mg/dL (ref 8.9–10.3)
Chloride: 107 mmol/L (ref 98–111)
Creatinine, Ser: 1.14 mg/dL — ABNORMAL HIGH (ref 0.44–1.00)
GFR calc Af Amer: 57 mL/min — ABNORMAL LOW (ref >60)
GFR calc non Af Amer: 49 mL/min — ABNORMAL LOW (ref >60)
Glucose, Bld: 143 mg/dL — ABNORMAL HIGH (ref 70–99)
Potassium: 4.4 mmol/L (ref 3.5–5.1)
Sodium: 141 mmol/L (ref 135–145)

## 2019-04-10 LAB — CBC
HCT: 40.2 % (ref 36.0–46.0)
Hemoglobin: 12.4 g/dL (ref 12.0–15.0)
MCH: 29 pg (ref 26.0–34.0)
MCHC: 30.8 g/dL (ref 30.0–36.0)
MCV: 94.1 fL (ref 80.0–100.0)
Platelets: 507 10*3/uL — ABNORMAL HIGH (ref 150–400)
RBC: 4.27 MIL/uL (ref 3.87–5.11)
RDW: 13.8 % (ref 11.5–15.5)
WBC: 18.3 10*3/uL — ABNORMAL HIGH (ref 4.0–10.5)
nRBC: 0 % (ref 0.0–0.2)

## 2019-04-10 LAB — HEMOGLOBIN A1C
Hgb A1c MFr Bld: 8.6 % — ABNORMAL HIGH (ref 4.8–5.6)
Mean Plasma Glucose: 200.12 mg/dL

## 2019-04-10 LAB — GLUCOSE, CAPILLARY: Glucose-Capillary: 151 mg/dL — ABNORMAL HIGH (ref 70–99)

## 2019-04-10 NOTE — Progress Notes (Addendum)
PCP - Dr. Jodi Mourning Cardiologist - Dr. Acie Fredrickson  Pt breast seed appointment- 04/14/19  Chest x-ray - N/A EKG - 03/23/19 Stress Test - 10/11/15 ECHO - 10/11/15 Cardiac Cath - denies  Sleep Study - "several years ago", no OSA CPAP - does not use  Fasting Blood Sugar -  110-150 Checks Blood Sugar QAM   Aspirin Instructions: Patient instructed to hold all Aspirin, NSAID's, herbal medications, fish oil and vitamins 7 days prior to surgery.   ERAS Protcol - Clear liquids until 0815 DOS PRE-SURGERY Ensure or G2- not ordered  COVID TEST- 04/11/19. Pt instructed to inform security they are having surgery and need to go to the [address]. Pt aware of they arrive at the "white tent" the need to redirect to the surgical testing site.   Anesthesia review: breast seed/ history of cardiac dysrhythmia   Patient denies shortness of breath, fever, cough and chest pain at PAT appointment   All instructions explained to the patient, with a verbal understanding of the material. Patient agrees to go over the instructions while at home for a better understanding. Patient also instructed to self quarantine after being tested for COVID-19. The opportunity to ask questions was provided.

## 2019-04-11 ENCOUNTER — Other Ambulatory Visit (HOSPITAL_COMMUNITY)
Admission: RE | Admit: 2019-04-11 | Discharge: 2019-04-11 | Disposition: A | Discharge status: Home / Self Care | Payer: Medicare Other | Source: Ambulatory Visit | Attending: Surgery | Admitting: Surgery

## 2019-04-11 DIAGNOSIS — Z20828 Contact with and (suspected) exposure to other viral communicable diseases: Secondary | ICD-10-CM | POA: Insufficient documentation

## 2019-04-12 LAB — NOVEL CORONAVIRUS, NAA (HOSP ORDER, SEND-OUT TO REF LAB; TAT 18-24 HRS): SARS-CoV-2, NAA: NOT DETECTED

## 2019-04-13 NOTE — Progress Notes (Signed)
Anesthesia Chart Review: Follows with cardiology for hx of chest pain and LBBB. She had an echocardiogram in 2017 which revealed normal left ventricular systolic function, mild diastolic dysfunction. Myoview study revealed no evidence of ischemia. She was last seen by Dr. Acie Fredrickson 03/23/19 and at that time was cleared for potential surgery related to new breast CA diagnosis. Per note, "She was recently diagnosed as having breast cancer.  She is going to see the surgeon later this week.  From a cardiac standpoint she is very stable.  She has a history of hypertension and left bundle branch block.  She has normal left ventricular systolic function.  If she needs to have surgery for lumpectomy or more involved surgery, she will be at low risk for any cardiac complications."  History of stable leukocytosis and thrombocytosis. 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   Nuclear stress 10/11/15:            Nuclear stress EF: 48%. There was septal hypokinesis consistent with left bundle branch block            There was no ST segment deviation noted during stress.            Defect 1: There is a small defect of moderate severity present in the mid anteroseptal and apical septal location. This defect was fixed and most likely associated with underlying left bundle branch block.            This is a low to intermediate risk study (based on ejection fraction less than 48%).   EF 48% with septal wall hypokinesis consistent with underlying left bundle branch block abnormality on EKG. There is a fixed anteroseptal distal defect most likely secondary to underlying left bundle branch block. No significant ischemia identified. Overall low to intermediate risk study based on ejection fraction of 48%, mildly reduced.  TTE 10/11/15: - Left ventricle: The cavity size was normal. Wall thickness was   increased in a pattern of mild LVH. Systolic function was normal.   The estimated ejection fraction was  in the range of 50% to 55%.   Incoordinate septal motion. Doppler parameters are consistent   with abnormal left ventricular relaxation (grade 1 diastolic   dysfunction). The E/e&' ratio is >15, suggesting elevated LV   filling pressure. - Mitral valve: Calcified annulus. Mildly thickened leaflets .   There was mild regurgitation. - Left atrium: The atrium was normal in size. - Inferior vena cava: The vessel was normal in size. The   respirophasic diameter changes were in the normal range (>= 50%),   consistent with normal central venous pressure.  Impressions:  - LVEF 50-55%, mild LVH, incoordinate septal motion, diastolic   dysfunction with elevated LV filling pressure, MAC with mild MR,   normal LA size, normal IVC.    [facility] Short Stay Center/Anesthesiology Phone [phone] 04/13/2019 2:21 PM

## 2019-04-13 NOTE — Anesthesia Preprocedure Evaluation (Addendum)
Anesthesia Evaluation  Patient identified by MRN, date of birth, ID band Patient awake    Reviewed: Allergy & Precautions, NPO status , Patient's Chart, lab work & pertinent test results  Airway Mallampati: II  TM Distance: >3 FB Neck ROM: Full    Dental no notable dental hx.    Pulmonary asthma ,    Pulmonary exam normal breath sounds clear to auscultation       Cardiovascular hypertension, Pt. on medications negative cardio ROS Normal cardiovascular exam Rhythm:Regular Rate:Normal     Neuro/Psych Depression negative neurological ROS  negative psych ROS   GI/Hepatic Neg liver ROS, GERD  ,  Endo/Other  negative endocrine ROSdiabetes, Type 2  Renal/GU negative Renal ROS  negative genitourinary   Musculoskeletal  (+) Arthritis , Osteoarthritis,    Abdominal   Peds negative pediatric ROS (+)  Hematology negative hematology ROS (+)   Anesthesia Other Findings Breast Cancer  Reproductive/Obstetrics negative OB ROS                            Anesthesia Physical Anesthesia Plan  ASA: III  Anesthesia Plan: General   Post-op Pain Management:  Regional for Post-op pain   Induction: Intravenous  PONV Risk Score and Plan: 3 and Ondansetron, Dexamethasone, Midazolam and Treatment may vary due to age or medical condition  Airway Management Planned: LMA and Oral ETT  Additional Equipment:   Intra-op Plan:   Post-operative Plan: Extubation in OR  Informed Consent: I have reviewed the patients History and Physical, chart, labs and discussed the procedure including the risks, benefits and alternatives for the proposed anesthesia with the patient or authorized representative who has indicated his/her understanding and acceptance.     Dental advisory given  Plan Discussed with: CRNA  Anesthesia Plan Comments: (Follows with cardiology for hx of chest pain and LBBB. She had an  echocardiogram in 2017 which revealed normal left ventricular systolic function, mild diastolic dysfunction. Myoview study revealed no evidence of ischemia. She was last seen by Dr. Acie Fredrickson 03/23/19 and at that time was cleared for potential surgery related to new breast CA diagnosis. Per note, "She was recently diagnosed as having breast cancer.  She is going to see the surgeon later this week.  From a cardiac standpoint she is very stable.  She has a history of hypertension and left bundle branch block.  She has normal left ventricular systolic function.  If she needs to have surgery for lumpectomy or more involved surgery, she will be at low risk for any cardiac complications."  History of stable leukocytosis and thrombocytosis. 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   Nuclear stress 10/11/15:  Nuclear stress EF: 48%. There was septal hypokinesis consistent with left bundle branch block  There was no ST segment deviation noted during stress.  Defect 1: There is a small defect of moderate severity present in the mid anteroseptal and apical septal location. This defect was fixed and most likely associated with underlying left bundle branch block.  This is a low to intermediate risk study (based on ejection fraction less than 48%).   EF 48% with septal wall hypokinesis consistent with underlying left bundle branch block abnormality on EKG. There is a fixed anteroseptal distal defect most likely secondary to underlying left bundle branch block. No significant ischemia identified. Overall low to intermediate risk study based on ejection fraction of 48%, mildly reduced.  TTE 10/11/15: - Left ventricle: The  cavity size was normal. Wall thickness was   increased in a pattern of mild LVH. Systolic function was normal.   The estimated ejection fraction was in the range of 50% to 55%.   Incoordinate septal motion. Doppler parameters are consistent   with abnormal left ventricular  relaxation (grade 1 diastolic   dysfunction). The E/e&' ratio is >15, suggesting elevated LV   filling pressure. - Mitral valve: Calcified annulus. Mildly thickened leaflets .   There was mild regurgitation. - Left atrium: The atrium was normal in size. - Inferior vena cava: The vessel was normal in size. The   respirophasic diameter changes were in the normal range (>= 50%),   consistent with normal central venous pressure.  Impressions:  - LVEF 50-55%, mild LVH, incoordinate septal motion, diastolic   dysfunction with elevated LV filling pressure, MAC with mild MR,   normal LA size, normal IVC.)       Anesthesia Quick Evaluation

## 2019-04-14 ENCOUNTER — Other Ambulatory Visit: Payer: Self-pay

## 2019-04-14 ENCOUNTER — Ambulatory Visit
Admission: RE | Admit: 2019-04-14 | Discharge: 2019-04-14 | Disposition: A | Discharge status: Home / Self Care | Payer: Medicare Other | Source: Ambulatory Visit | Attending: Surgery | Admitting: Surgery

## 2019-04-14 DIAGNOSIS — Z853 Personal history of malignant neoplasm of breast: Secondary | ICD-10-CM

## 2019-04-14 NOTE — H&P (Signed)
Margaret Norman Documented: 03/24/2019 11:00 AM Location: [facility] Patient #: [id number] DOB: [birth year] Married / Language: English / Race: White Female   History of Present Illness (Madelina Sanda A. [name] MD; 03/24/2019 11:35 AM) The patient is a 68 year old female who presents with breast cancer. This is a 68 year old female referred by Dr. Kathryne Eriksson after the recent diagnosis of a right breast cancer. She had an abnormal mass found on mammography and ultrasound. This measured approximately 1.3 cm by 1 cm at the 9 o'clock position 4-5 cm from the right nipple. She underwent a stereotactic biopsy. 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 She has no previous history of breast cancer. There is no family history of breast cancer. She has had no problems regarding her breasts. She denies nipple discharge. She is otherwise without complaints.   Past Surgical History Malachi Bonds, CMA; 03/24/2019 11:15 AM) Appendectomy  Breast Biopsy  Bilateral. Cesarean Section - Multiple  Gallbladder Surgery - Laparoscopic  Oral Surgery  Pancreas Surgery  Tonsillectomy   Diagnostic Studies History Malachi Bonds, CMA; 03/24/2019 11:15 AM) Colonoscopy  1-5 years ago Mammogram  within last year Pap Smear  1-5 years ago  Allergies Nance Pew, CMA; 03/24/2019 11:00 AM) No Known Allergies  [03/24/2019]: No Known Drug Allergies  [03/24/2019]: Allergies Reconciled   Medication History (Sabrina Canty, CMA; 7/37/1062 69:48 AM) Trulicity (5.4OE/7.0JJ Soln Pen-inj, Subcutaneous) Active. Sertraline HCl (100MG Tablet, Oral) Active. Omeprazole (40MG Capsule DR, Oral) Active. Meloxicam (15MG Tablet, Oral) Active. buPROPion HCl ER (XL) (150MG Tablet ER 24HR, Oral) Active. Jardiance (25MG Tablet, Oral) Active. metFORMIN HCl (1000MG Tablet, Oral) Active. Olmesartan Medoxomil (40MG Tablet, Oral)  Active. Rosuvastatin Calcium (10MG Tablet, Oral) Active. Medications Reconciled  Social History Malachi Bonds, CMA; 03/24/2019 11:15 AM) Alcohol use  Occasional alcohol use. Caffeine use  Coffee, Tea. No drug use  Tobacco use  Never smoker.  Family History Malachi Bonds, CMA; 03/24/2019 11:15 AM) Depression  Mother. Diabetes Mellitus  Brother, Mother. Heart disease in female family member before age 52   Pregnancy / Birth History Malachi Bonds, [place]; 03/24/2019 11:15 AM) Age at menarche  82 years. Age of menopause  51-55 Contraceptive History  Oral contraceptives. Gravida  2 Maternal age  42-25 Para  2 Regular periods   Other Problems Malachi Bonds, CMA; 03/24/2019 11:15 AM) Arthritis  Breast Cancer  Depression  Diabetes Mellitus  Gastroesophageal Reflux Disease  Hemorrhoids  High blood pressure  Lump In Breast     Review of Systems (Chemira Jones CMA; 03/24/2019 11:15 AM) General Not Present- Appetite Loss, Chills, Fatigue, Fever, Night Sweats, Weight Gain and Weight Loss. HEENT Present- Ringing in the Ears, Sinus Pain and Wears glasses/contact lenses. Not Present- Earache, Hearing Loss, Hoarseness, Nose Bleed, Oral Ulcers, Seasonal Allergies, Sore Throat, Visual Disturbances and Yellow Eyes. Respiratory Not Present- Bloody sputum, Chronic Cough, Difficulty Breathing, Snoring and Wheezing. Breast Present- Breast Mass. Not Present- Breast Pain, Nipple Discharge and Skin Changes. Cardiovascular Not Present- Chest Pain, Difficulty Breathing Lying Down, Leg Cramps, Palpitations, Rapid Heart Rate, Shortness of Breath and Swelling of Extremities. Musculoskeletal Present- Joint Pain, Joint Stiffness and Muscle Weakness. Not Present- Back Pain, Muscle Pain and Swelling of Extremities. Neurological Not Present- Decreased Memory, Fainting, Headaches, Numbness, Seizures, Tingling, Tremor, Trouble walking and Weakness. Psychiatric Not Present- Anxiety, Bipolar,  Change in Sleep Pattern, Depression, Fearful and Frequent crying.  Vitals Gabriel Cirri Canty CMA; 03/24/2019 11:02 AM) 03/24/2019 11:01  AM Weight: 151 lb Height: 62in Body Surface Area: 1.7 m Body Mass Index: 27.62 kg/m  Temp.: 97.70F (Temporal)  Pulse: 108 (Regular)  BP: 108/62(Sitting, Left Arm, Standard)       Physical Exam (Halia Franey A. [name] MD; 03/24/2019 11:35 AM) General Mental Status-Alert. General Appearance-Consistent with stated age. Hydration-Well hydrated. Voice-Normal.  Head and Neck Head-normocephalic, atraumatic with no lesions or palpable masses. Trachea-midline. Thyroid Gland Characteristics - normal size and consistency.  Eye Eyeball - Bilateral-Extraocular movements intact. Sclera/Conjunctiva - Bilateral-No scleral icterus.  Chest and Lung Exam Chest and lung exam reveals -quiet, even and easy respiratory effort with no use of accessory muscles and on auscultation, normal breath sounds, no adventitious sounds and normal vocal resonance. Inspection Chest Wall - Normal. Back - normal.  Breast Breast - Left-Symmetric, Non Tender, No Biopsy scars, no Dimpling - Left, No Inflammation, No Lumpectomy scars, No Mastectomy scars, No Peau d' Orange. Breast - Right-Symmetric, Non Tender, No Biopsy scars, no Dimpling - Right, No Inflammation, No Lumpectomy scars, No Mastectomy scars, No Peau d' Orange. Breast Lump-No Palpable Breast Mass. Note: There are no palpable masses in either breast and no axillary adenopathy. There is minimal bruising from her biopsy.   Cardiovascular Cardiovascular examination reveals -normal heart sounds, regular rate and rhythm with no murmurs and normal pedal pulses bilaterally.  Abdomen - Did not examine.  Neurologic Neurologic evaluation reveals -alert and oriented x 3 with no impairment of recent or remote memory. Mental Status-Normal.  Musculoskeletal Normal Exam - Left-Upper  Extremity Strength Normal and Lower Extremity Strength Normal. Normal Exam - Right-Upper Extremity Strength Normal and Lower Extremity Strength Normal.  Lymphatic Head & Neck  General Head & Neck Lymphatics: Bilateral - Description - Normal. Axillary  General Axillary Region: Bilateral - Description - Normal. Tenderness - Non Tender. Femoral & Inguinal  Generalized Femoral & Inguinal Lymphatics: Bilateral - Description - Normal. Tenderness - Non Tender.    Assessment & Plan (Billey Wojciak A. [name] MD; 03/24/2019 11:37 AM)  BREAST CANCER, RIGHT (C50.911) Impression: This is a patient with a right breast invasive ductal carcinoma. Again it is ER and PR positive as well as HER-2 positive. I discussed the diagnosis with the patient and her husband as well as her daughter by phone. We discussed surgical options including breast conservation versus mastectomy. We discussed the need for chemotherapy and other treatments. I believe she is a candidate for breast conservation. I discussed proceeding with a radioactive seed guided right breast partial mastectomy with sentinel lymph node biopsy and Port-A-Cath insertion. She will be referred to both medical and radiation oncology. I discussed the procedure in detail. We discussed the risks which include but are not limited to bleeding, infection, pneumothorax, the need for further procedures, the possibility of having positive margins and need for further surgery, injury to any structures, cardiopulmonary issues, postoperative recovery, etc. She understands and wishes to proceed with surgery as soon as possible.

## 2019-04-15 ENCOUNTER — Ambulatory Visit (HOSPITAL_COMMUNITY): Payer: Medicare Other | Admitting: Certified Registered"

## 2019-04-15 ENCOUNTER — Ambulatory Visit (HOSPITAL_COMMUNITY): Payer: Medicare Other | Admitting: Vascular Surgery

## 2019-04-15 ENCOUNTER — Encounter (HOSPITAL_COMMUNITY)
Admission: RE | Disposition: A | Discharge status: Home / Self Care | Payer: Self-pay | Source: Home / Self Care | Attending: Surgery

## 2019-04-15 ENCOUNTER — Ambulatory Visit
Admission: RE | Admit: 2019-04-15 | Discharge: 2019-04-15 | Disposition: A | Discharge status: Home / Self Care | Payer: Medicare Other | Source: Ambulatory Visit | Attending: Surgery | Admitting: Surgery

## 2019-04-15 ENCOUNTER — Other Ambulatory Visit: Payer: Self-pay

## 2019-04-15 ENCOUNTER — Inpatient Hospital Stay (HOSPITAL_COMMUNITY)
Admission: RE | Admit: 2019-04-15 | Discharge: 2019-04-16 | DRG: 580 | Disposition: A | Discharge status: Home / Self Care | Payer: Medicare Other | Attending: Surgery | Admitting: Surgery

## 2019-04-15 ENCOUNTER — Encounter (HOSPITAL_COMMUNITY)
Admission: RE | Admit: 2019-04-15 | Discharge: 2019-04-15 | Disposition: A | Discharge status: Home / Self Care | Payer: Medicare Other | Source: Ambulatory Visit | Attending: Surgery | Admitting: Surgery

## 2019-04-15 ENCOUNTER — Ambulatory Visit (HOSPITAL_COMMUNITY): Payer: Medicare Other

## 2019-04-15 ENCOUNTER — Encounter (HOSPITAL_COMMUNITY): Payer: Self-pay

## 2019-04-15 ENCOUNTER — Inpatient Hospital Stay (HOSPITAL_COMMUNITY): Payer: Medicare Other

## 2019-04-15 DIAGNOSIS — Z20828 Contact with and (suspected) exposure to other viral communicable diseases: Secondary | ICD-10-CM | POA: Diagnosis not present

## 2019-04-15 DIAGNOSIS — I10 Essential (primary) hypertension: Secondary | ICD-10-CM | POA: Diagnosis present

## 2019-04-15 DIAGNOSIS — I452 Bifascicular block: Secondary | ICD-10-CM | POA: Diagnosis not present

## 2019-04-15 DIAGNOSIS — I4439 Other atrioventricular block: Secondary | ICD-10-CM | POA: Diagnosis present

## 2019-04-15 DIAGNOSIS — Z880 Allergy status to penicillin: Secondary | ICD-10-CM | POA: Diagnosis not present

## 2019-04-15 DIAGNOSIS — C50911 Malignant neoplasm of unspecified site of right female breast: Secondary | ICD-10-CM | POA: Diagnosis not present

## 2019-04-15 DIAGNOSIS — I441 Atrioventricular block, second degree: Secondary | ICD-10-CM | POA: Diagnosis present

## 2019-04-15 DIAGNOSIS — I447 Left bundle-branch block, unspecified: Secondary | ICD-10-CM

## 2019-04-15 DIAGNOSIS — Z7982 Long term (current) use of aspirin: Secondary | ICD-10-CM | POA: Diagnosis not present

## 2019-04-15 DIAGNOSIS — R001 Bradycardia, unspecified: Secondary | ICD-10-CM | POA: Diagnosis present

## 2019-04-15 DIAGNOSIS — Z853 Personal history of malignant neoplasm of breast: Secondary | ICD-10-CM

## 2019-04-15 DIAGNOSIS — E119 Type 2 diabetes mellitus without complications: Secondary | ICD-10-CM | POA: Diagnosis not present

## 2019-04-15 DIAGNOSIS — I442 Atrioventricular block, complete: Secondary | ICD-10-CM | POA: Diagnosis not present

## 2019-04-15 DIAGNOSIS — K219 Gastro-esophageal reflux disease without esophagitis: Secondary | ICD-10-CM | POA: Diagnosis not present

## 2019-04-15 DIAGNOSIS — Z794 Long term (current) use of insulin: Secondary | ICD-10-CM | POA: Diagnosis not present

## 2019-04-15 DIAGNOSIS — F329 Major depressive disorder, single episode, unspecified: Secondary | ICD-10-CM | POA: Diagnosis not present

## 2019-04-15 DIAGNOSIS — Z791 Long term (current) use of non-steroidal anti-inflammatories (NSAID): Secondary | ICD-10-CM

## 2019-04-15 DIAGNOSIS — C50919 Malignant neoplasm of unspecified site of unspecified female breast: Secondary | ICD-10-CM | POA: Diagnosis present

## 2019-04-15 DIAGNOSIS — Z79899 Other long term (current) drug therapy: Secondary | ICD-10-CM | POA: Diagnosis not present

## 2019-04-15 DIAGNOSIS — R9431 Abnormal electrocardiogram [ECG] [EKG]: Secondary | ICD-10-CM | POA: Diagnosis not present

## 2019-04-15 DIAGNOSIS — E785 Hyperlipidemia, unspecified: Secondary | ICD-10-CM | POA: Diagnosis present

## 2019-04-15 DIAGNOSIS — Z452 Encounter for adjustment and management of vascular access device: Secondary | ICD-10-CM

## 2019-04-15 DIAGNOSIS — Z419 Encounter for procedure for purposes other than remedying health state, unspecified: Secondary | ICD-10-CM

## 2019-04-15 HISTORY — PX: BREAST LUMPECTOMY WITH RADIOACTIVE SEED AND SENTINEL LYMPH NODE BIOPSY: SHX6550

## 2019-04-15 HISTORY — PX: PORTACATH PLACEMENT: SHX2246

## 2019-04-15 LAB — GLUCOSE, CAPILLARY
Glucose-Capillary: 105 mg/dL — ABNORMAL HIGH (ref 70–99)
Glucose-Capillary: 134 mg/dL — ABNORMAL HIGH (ref 70–99)
Glucose-Capillary: 137 mg/dL — ABNORMAL HIGH (ref 70–99)
Glucose-Capillary: 123 mg/dL — ABNORMAL HIGH (ref 70–99)

## 2019-04-15 LAB — BASIC METABOLIC PANEL
Anion gap: 10 (ref 5–15)
BUN: 20 mg/dL (ref 8–23)
CO2: 22 mmol/L (ref 22–32)
Calcium: 9.3 mg/dL (ref 8.9–10.3)
Chloride: 108 mmol/L (ref 98–111)
Creatinine, Ser: 1.03 mg/dL — ABNORMAL HIGH (ref 0.44–1.00)
GFR calc Af Amer: >60 mL/min (ref >60)
GFR calc non Af Amer: 56 mL/min — ABNORMAL LOW (ref >60)
Glucose, Bld: 138 mg/dL — ABNORMAL HIGH (ref 70–99)
Potassium: 4.2 mmol/L (ref 3.5–5.1)
Sodium: 140 mmol/L (ref 135–145)

## 2019-04-15 LAB — ECHOCARDIOGRAM COMPLETE
Height: 62 in
Weight: 2480 oz

## 2019-04-15 LAB — TROPONIN I (HIGH SENSITIVITY)
Troponin I (High Sensitivity): 34 ng/L — ABNORMAL HIGH (ref <18)
Troponin I (High Sensitivity): 36 ng/L — ABNORMAL HIGH (ref <18)

## 2019-04-15 LAB — BRAIN NATRIURETIC PEPTIDE: B Natriuretic Peptide: 86.2 pg/mL (ref 0.0–100.0)

## 2019-04-15 LAB — MRSA PCR SCREENING: MRSA by PCR: NEGATIVE

## 2019-04-15 LAB — TSH: TSH: 1.167 u[IU]/mL (ref 0.350–4.500)

## 2019-04-15 SURGERY — BREAST LUMPECTOMY WITH RADIOACTIVE SEED AND SENTINEL LYMPH NODE BIOPSY
Anesthesia: General | Site: Chest | Laterality: Right

## 2019-04-15 MED ORDER — CHLORHEXIDINE GLUCONATE CLOTH 2 % EX PADS: 6.0000 | MEDICATED_PAD | Freq: Once | CUTANEOUS | Status: DC

## 2019-04-15 MED ORDER — MORPHINE SULFATE (PF) 2 MG/ML IV SOLN: 1.0000 mg | INTRAVENOUS | Status: DC | PRN

## 2019-04-15 MED ORDER — HEPARIN SOD (PORK) LOCK FLUSH 100 UNIT/ML IV SOLN
INTRAVENOUS | Status: DC | PRN
  Administered 2019-04-15: 500 [IU] via INTRAVENOUS

## 2019-04-15 MED ORDER — OXYCODONE HCL 5 MG PO TABS: 5.0000 mg | ORAL_TABLET | Freq: Once | ORAL | Status: DC | PRN

## 2019-04-15 MED ORDER — ATROPINE SULFATE 0.4 MG/ML IJ SOLN
INTRAMUSCULAR | Status: DC | PRN
  Administered 2019-04-15 (×2): 0.4 mg via INTRAVENOUS

## 2019-04-15 MED ORDER — CIPROFLOXACIN IN D5W 400 MG/200ML IV SOLN
400.0000 mg | INTRAVENOUS | Status: AC
  Administered 2019-04-15: 11:00:00 400 mg via INTRAVENOUS

## 2019-04-15 MED ORDER — MIDAZOLAM HCL 2 MG/2ML IJ SOLN
INTRAMUSCULAR | Status: AC
  Administered 2019-04-15: 10:00:00 2 mg via INTRAVENOUS
  Filled 2019-04-15: qty 2

## 2019-04-15 MED ORDER — 0.9 % SODIUM CHLORIDE (POUR BTL) OPTIME
TOPICAL | Status: DC | PRN
  Administered 2019-04-15: 1000 mL

## 2019-04-15 MED ORDER — STERILE WATER FOR INJECTION IJ SOLN
INTRAMUSCULAR | Status: AC
  Filled 2019-04-15: qty 10

## 2019-04-15 MED ORDER — PROMETHAZINE HCL 25 MG/ML IJ SOLN: 6.2500 mg | INTRAMUSCULAR | Status: DC | PRN

## 2019-04-15 MED ORDER — SODIUM CHLORIDE (PF) 0.9 % IJ SOLN
INTRAVENOUS | Status: DC | PRN
  Administered 2019-04-15: 5 mL via INTRAMUSCULAR

## 2019-04-15 MED ORDER — SODIUM CHLORIDE 0.9 % IV SOLN
INTRAVENOUS | Status: DC | PRN
  Administered 2019-04-15: 500 mL

## 2019-04-15 MED ORDER — FENTANYL CITRATE (PF) 100 MCG/2ML IJ SOLN
INTRAMUSCULAR | Status: DC | PRN
  Administered 2019-04-15: 50 ug via INTRAVENOUS

## 2019-04-15 MED ORDER — HEPARIN SOD (PORK) LOCK FLUSH 100 UNIT/ML IV SOLN
INTRAVENOUS | Status: AC
  Filled 2019-04-15: qty 5

## 2019-04-15 MED ORDER — CHLORHEXIDINE GLUCONATE CLOTH 2 % EX PADS
6.0000 | MEDICATED_PAD | Freq: Every day | CUTANEOUS | Status: DC
  Administered 2019-04-15: 15:00:00 6 via TOPICAL

## 2019-04-15 MED ORDER — MEPERIDINE HCL 25 MG/ML IJ SOLN: 6.2500 mg | INTRAMUSCULAR | Status: DC | PRN

## 2019-04-15 MED ORDER — EPINEPHRINE 1 MG/10ML IJ SOSY
PREFILLED_SYRINGE | INTRAMUSCULAR | Status: DC | PRN
  Administered 2019-04-15 (×2): .5 via INTRAVENOUS
  Administered 2019-04-15: .1 via INTRAVENOUS

## 2019-04-15 MED ORDER — BUPIVACAINE-EPINEPHRINE 0.25% -1:200000 IJ SOLN
INTRAMUSCULAR | Status: DC | PRN
  Administered 2019-04-15: 12 mL

## 2019-04-15 MED ORDER — ACETAMINOPHEN 500 MG PO TABS
ORAL_TABLET | ORAL | Status: AC
  Administered 2019-04-15: 1000 mg via ORAL
  Filled 2019-04-15: qty 2

## 2019-04-15 MED ORDER — OXYCODONE HCL 5 MG PO TABS: 5.0000 mg | ORAL_TABLET | ORAL | Status: DC | PRN

## 2019-04-15 MED ORDER — MIDAZOLAM HCL 2 MG/2ML IJ SOLN
2.0000 mg | Freq: Once | INTRAMUSCULAR | Status: AC
  Administered 2019-04-15: 10:00:00 2 mg via INTRAVENOUS

## 2019-04-15 MED ORDER — LACTATED RINGERS IV SOLN
INTRAVENOUS | Status: DC
  Administered 2019-04-15 (×2): via INTRAVENOUS

## 2019-04-15 MED ORDER — INSULIN ASPART 100 UNIT/ML ~~LOC~~ SOLN
0.0000 [IU] | Freq: Three times a day (TID) | SUBCUTANEOUS | Status: DC
  Administered 2019-04-15: 16:00:00 2 [IU] via SUBCUTANEOUS

## 2019-04-15 MED ORDER — FENTANYL CITRATE (PF) 100 MCG/2ML IJ SOLN
INTRAMUSCULAR | Status: AC
  Administered 2019-04-15: 10:00:00 50 ug via INTRAVENOUS
  Filled 2019-04-15: qty 2

## 2019-04-15 MED ORDER — ENOXAPARIN SODIUM 40 MG/0.4ML ~~LOC~~ SOLN
40.0000 mg | SUBCUTANEOUS | Status: DC
  Administered 2019-04-16: 40 mg via SUBCUTANEOUS
  Filled 2019-04-15: qty 0.4

## 2019-04-15 MED ORDER — FENTANYL CITRATE (PF) 100 MCG/2ML IJ SOLN
50.0000 ug | Freq: Once | INTRAMUSCULAR | Status: AC
  Administered 2019-04-15: 10:00:00 50 ug via INTRAVENOUS

## 2019-04-15 MED ORDER — CIPROFLOXACIN IN D5W 400 MG/200ML IV SOLN
INTRAVENOUS | Status: AC
  Filled 2019-04-15: qty 200

## 2019-04-15 MED ORDER — LACTATED RINGERS IV SOLN
INTRAVENOUS | Status: DC | PRN
  Administered 2019-04-15: 13:00:00 via INTRAVENOUS

## 2019-04-15 MED ORDER — GABAPENTIN 300 MG PO CAPS
ORAL_CAPSULE | ORAL | Status: AC
  Administered 2019-04-15: 300 mg via ORAL
  Filled 2019-04-15: qty 1

## 2019-04-15 MED ORDER — EPINEPHRINE HCL 5 MG/250ML IV SOLN IN NS
INTRAVENOUS | Status: DC | PRN
  Administered 2019-04-15: 1 ug/min via INTRAVENOUS

## 2019-04-15 MED ORDER — FENTANYL CITRATE (PF) 250 MCG/5ML IJ SOLN
INTRAMUSCULAR | Status: AC
  Filled 2019-04-15: qty 5

## 2019-04-15 MED ORDER — SODIUM CHLORIDE 0.9 % IV SOLN
INTRAVENOUS | Status: AC
  Filled 2019-04-15: qty 1.2

## 2019-04-15 MED ORDER — POTASSIUM CHLORIDE IN NACL 20-0.9 MEQ/L-% IV SOLN
INTRAVENOUS | Status: DC
  Administered 2019-04-15: 16:00:00 via INTRAVENOUS
  Filled 2019-04-15 (×2): qty 1000

## 2019-04-15 MED ORDER — DIPHENHYDRAMINE HCL 50 MG/ML IJ SOLN: 25.0000 mg | Freq: Four times a day (QID) | INTRAMUSCULAR | Status: DC | PRN

## 2019-04-15 MED ORDER — INSULIN ASPART 100 UNIT/ML ~~LOC~~ SOLN: 0.0000 [IU] | Freq: Every day | SUBCUTANEOUS | Status: DC

## 2019-04-15 MED ORDER — DIPHENHYDRAMINE HCL 25 MG PO CAPS: 25.0000 mg | ORAL_CAPSULE | Freq: Four times a day (QID) | ORAL | Status: DC | PRN

## 2019-04-15 MED ORDER — HYDRALAZINE HCL 20 MG/ML IJ SOLN
10.0000 mg | INTRAMUSCULAR | Status: DC | PRN
  Administered 2019-04-15: 18:00:00 10 mg via INTRAVENOUS
  Filled 2019-04-15: qty 1

## 2019-04-15 MED ORDER — ONDANSETRON HCL 4 MG/2ML IJ SOLN: 4.0000 mg | Freq: Four times a day (QID) | INTRAMUSCULAR | Status: DC | PRN

## 2019-04-15 MED ORDER — ONDANSETRON HCL 4 MG/2ML IJ SOLN
INTRAMUSCULAR | Status: DC | PRN
  Administered 2019-04-15: 4 mg via INTRAVENOUS

## 2019-04-15 MED ORDER — PHENYLEPHRINE 40 MCG/ML (10ML) SYRINGE FOR IV PUSH (FOR BLOOD PRESSURE SUPPORT)
PREFILLED_SYRINGE | INTRAVENOUS | Status: DC | PRN
  Administered 2019-04-15: 80 ug via INTRAVENOUS
  Administered 2019-04-15: 40 ug via INTRAVENOUS
  Administered 2019-04-15: 120 ug via INTRAVENOUS
  Administered 2019-04-15: 40 ug via INTRAVENOUS

## 2019-04-15 MED ORDER — TRAMADOL HCL 50 MG PO TABS: 50.0000 mg | ORAL_TABLET | Freq: Four times a day (QID) | ORAL | Status: DC | PRN

## 2019-04-15 MED ORDER — BUPIVACAINE-EPINEPHRINE (PF) 0.25% -1:200000 IJ SOLN
INTRAMUSCULAR | Status: AC
  Filled 2019-04-15: qty 20

## 2019-04-15 MED ORDER — ONDANSETRON 4 MG PO TBDP
4.0000 mg | ORAL_TABLET | Freq: Four times a day (QID) | ORAL | Status: DC | PRN
  Filled 2019-04-15: qty 1

## 2019-04-15 MED ORDER — ACETAMINOPHEN 500 MG PO TABS
1000.0000 mg | ORAL_TABLET | ORAL | Status: AC
  Administered 2019-04-15: 10:00:00 1000 mg via ORAL

## 2019-04-15 MED ORDER — TECHNETIUM TC 99M SULFUR COLLOID FILTERED
1.0000 | Freq: Once | INTRAVENOUS | Status: AC | PRN
  Administered 2019-04-15: 1 via INTRADERMAL

## 2019-04-15 MED ORDER — ORAL CARE MOUTH RINSE
15.0000 mL | Freq: Two times a day (BID) | OROMUCOSAL | Status: DC
  Administered 2019-04-15 (×2): 15 mL via OROMUCOSAL

## 2019-04-15 MED ORDER — GABAPENTIN 300 MG PO CAPS
300.0000 mg | ORAL_CAPSULE | Freq: Two times a day (BID) | ORAL | Status: DC
  Administered 2019-04-15 – 2019-04-16 (×2): 300 mg via ORAL
  Filled 2019-04-15 (×3): qty 1

## 2019-04-15 MED ORDER — PROPOFOL 10 MG/ML IV BOLUS
INTRAVENOUS | Status: DC | PRN
  Administered 2019-04-15: 20 mg via INTRAVENOUS
  Administered 2019-04-15: 160 mg via INTRAVENOUS

## 2019-04-15 MED ORDER — METHYLENE BLUE 0.5 % INJ SOLN
INTRAVENOUS | Status: AC
  Filled 2019-04-15: qty 10

## 2019-04-15 MED ORDER — HYDROMORPHONE HCL 1 MG/ML IJ SOLN: 0.2500 mg | INTRAMUSCULAR | Status: DC | PRN

## 2019-04-15 MED ORDER — GABAPENTIN 300 MG PO CAPS
300.0000 mg | ORAL_CAPSULE | ORAL | Status: AC
  Administered 2019-04-15: 10:00:00 300 mg via ORAL

## 2019-04-15 MED ORDER — DEXAMETHASONE SODIUM PHOSPHATE 10 MG/ML IJ SOLN
INTRAMUSCULAR | Status: DC | PRN
  Administered 2019-04-15: 4 mg via INTRAVENOUS

## 2019-04-15 MED ORDER — ROPIVACAINE HCL 5 MG/ML IJ SOLN
INTRAMUSCULAR | Status: DC | PRN
  Administered 2019-04-15: 30 mL via PERINEURAL

## 2019-04-15 MED ORDER — OXYCODONE HCL 5 MG/5ML PO SOLN: 5.0000 mg | Freq: Once | ORAL | Status: DC | PRN

## 2019-04-15 MED ORDER — LIDOCAINE 2% (20 MG/ML) 5 ML SYRINGE
INTRAMUSCULAR | Status: DC | PRN
  Administered 2019-04-15: 60 mg via INTRAVENOUS

## 2019-04-15 SURGICAL SUPPLY — 67 items
ADH SKN CLS APL DERMABOND .7 (GAUZE/BANDAGES/DRESSINGS) ×4
APL PRP STRL LF DISP 70% ISPRP (MISCELLANEOUS) ×2
APPLIER CLIP 9.375 MED OPEN (MISCELLANEOUS) ×4
APR CLP MED 9.3 20 MLT OPN (MISCELLANEOUS) ×2
BAG DECANTER FOR FLEXI CONT (MISCELLANEOUS) ×4 IMPLANT
BINDER BREAST LRG (GAUZE/BANDAGES/DRESSINGS) IMPLANT
BINDER BREAST XLRG (GAUZE/BANDAGES/DRESSINGS) ×2 IMPLANT
CANISTER SUCT 3000ML PPV (MISCELLANEOUS) ×4 IMPLANT
CHLORAPREP W/TINT 26 (MISCELLANEOUS) ×4 IMPLANT
CLIP APPLIE 9.375 MED OPEN (MISCELLANEOUS) ×2 IMPLANT
COVER MAYO STAND STRL (DRAPES) ×2 IMPLANT
COVER PROBE W GEL 5X96 (DRAPES) ×4 IMPLANT
COVER SURGICAL LIGHT HANDLE (MISCELLANEOUS) ×6 IMPLANT
COVER TRANSDUCER ULTRASND GEL (DRAPE) IMPLANT
COVER WAND RF STERILE (DRAPES) ×4 IMPLANT
DERMABOND ADVANCED (GAUZE/BANDAGES/DRESSINGS) ×4
DERMABOND ADVANCED .7 DNX12 (GAUZE/BANDAGES/DRESSINGS) ×2 IMPLANT
DEVICE DUBIN SPECIMEN MAMMOGRA (MISCELLANEOUS) ×4 IMPLANT
DRAPE C-ARM 42X72 X-RAY (DRAPES) ×4 IMPLANT
DRAPE CHEST BREAST 15X10 FENES (DRAPES) ×4 IMPLANT
DRSG TEGADERM 4X4.75 (GAUZE/BANDAGES/DRESSINGS) ×4 IMPLANT
ELECT CAUTERY BLADE 6.4 (BLADE) ×4 IMPLANT
ELECT REM PT RETURN 9FT ADLT (ELECTROSURGICAL) ×4
ELECTRODE REM PT RTRN 9FT ADLT (ELECTROSURGICAL) ×2 IMPLANT
GAUZE 4X4 16PLY RFD (DISPOSABLE) ×4 IMPLANT
GAUZE SPONGE 2X2 8PLY STRL LF (GAUZE/BANDAGES/DRESSINGS) ×2 IMPLANT
GEL ULTRASOUND 20GR AQUASONIC (MISCELLANEOUS) IMPLANT
GLOVE BIO SURGEON STRL SZ7.5 (GLOVE) ×2 IMPLANT
GLOVE INDICATOR 7.5 STRL GRN (GLOVE) ×2 IMPLANT
GLOVE SURG SIGNA 7.5 PF LTX (GLOVE) ×4 IMPLANT
GOWN STRL REUS W/ TWL LRG LVL3 (GOWN DISPOSABLE) ×2 IMPLANT
GOWN STRL REUS W/ TWL XL LVL3 (GOWN DISPOSABLE) ×2 IMPLANT
GOWN STRL REUS W/TWL LRG LVL3 (GOWN DISPOSABLE) ×4
GOWN STRL REUS W/TWL XL LVL3 (GOWN DISPOSABLE) ×8
INTRODUCER COOK 11FR (CATHETERS) IMPLANT
KIT BASIN OR (CUSTOM PROCEDURE TRAY) ×4 IMPLANT
KIT MARKER MARGIN INK (KITS) ×4 IMPLANT
KIT PORT POWER 8FR ISP CVUE (Port) ×2 IMPLANT
KIT TURNOVER KIT B (KITS) ×4 IMPLANT
NDL 18GX1X1/2 (RX/OR ONLY) (NEEDLE) IMPLANT
NDL FILTER BLUNT 18X1 1/2 (NEEDLE) IMPLANT
NDL HYPO 25GX1X1/2 BEV (NEEDLE) ×2 IMPLANT
NEEDLE 18GX1X1/2 (RX/OR ONLY) (NEEDLE) IMPLANT
NEEDLE FILTER BLUNT 18X 1/2SAF (NEEDLE)
NEEDLE FILTER BLUNT 18X1 1/2 (NEEDLE) IMPLANT
NEEDLE HYPO 25GX1X1/2 BEV (NEEDLE) ×4 IMPLANT
NS IRRIG 1000ML POUR BTL (IV SOLUTION) ×4 IMPLANT
PACK GENERAL/GYN (CUSTOM PROCEDURE TRAY) ×4 IMPLANT
PAD ARMBOARD 7.5X6 YLW CONV (MISCELLANEOUS) ×4 IMPLANT
PENCIL BUTTON HOLSTER BLD 10FT (ELECTRODE) ×4 IMPLANT
POSITIONER HEAD DONUT 9IN (MISCELLANEOUS) ×4 IMPLANT
SET INTRODUCER 12FR PACEMAKER (INTRODUCER) IMPLANT
SET SHEATH INTRODUCER 10FR (MISCELLANEOUS) IMPLANT
SHEATH COOK PEEL AWAY SET 9F (SHEATH) ×2 IMPLANT
SPONGE GAUZE 2X2 STER 10/PKG (GAUZE/BANDAGES/DRESSINGS) ×2
SUT MNCRL AB 4-0 PS2 18 (SUTURE) ×4 IMPLANT
SUT PROLENE 2 0 SH 30 (SUTURE) ×4 IMPLANT
SUT SILK 2 0 (SUTURE)
SUT SILK 2-0 18XBRD TIE 12 (SUTURE) IMPLANT
SUT VIC AB 3-0 SH 18 (SUTURE) ×4 IMPLANT
SUT VIC AB 3-0 SH 27 (SUTURE) ×4
SUT VIC AB 3-0 SH 27XBRD (SUTURE) ×2 IMPLANT
SYR 5ML LUER SLIP (SYRINGE) ×4 IMPLANT
SYR CONTROL 10ML LL (SYRINGE) ×4 IMPLANT
TOWEL GREEN STERILE (TOWEL DISPOSABLE) ×4 IMPLANT
TOWEL GREEN STERILE FF (TOWEL DISPOSABLE) ×4 IMPLANT
TRAY LAPAROSCOPIC MC (CUSTOM PROCEDURE TRAY) ×4 IMPLANT

## 2019-04-15 NOTE — Interval H&P Note (Signed)
History and Physical Interval Note: no change in H and P  04/15/2019 9:22 AM  Margaret Norman  has presented today for surgery, with the diagnosis of RIGHT BREAST CANCER.  The various methods of treatment have been discussed with the patient and family. After consideration of risks, benefits and other options for treatment, the patient has consented to  Procedure(s): RIGHT BREAST PARTIAL MASTECTOMY WITH RADIOACTIVE SEED AND SENTINEL LYMPH NODE BIOPSY (Right) INSERTION PORT-A-CATH WITH ULTRASOUND (N/A) as a surgical intervention.  The patient's history has been reviewed, patient examined, no change in status, stable for surgery.  I have reviewed the patient's chart and labs.  Questions were answered to the patient's satisfaction.     Coralie Keens

## 2019-04-15 NOTE — Plan of Care (Signed)

## 2019-04-15 NOTE — Progress Notes (Signed)
Echocardiogram 2D Echocardiogram has been performed.  Oneal Deputy Jayzen Paver 04/15/2019, 3:43 PM

## 2019-04-15 NOTE — Progress Notes (Signed)
MD paged r/t pt SBP >170 since arrival to unit. MD ordered 10mg  IV hydralazine q4h to keep SBP<150.

## 2019-04-15 NOTE — Transfer of Care (Signed)
Immediate Anesthesia Transfer of Care Note  Patient: Calisa MILLENIA CARANO  Procedure(s) Performed: RIGHT BREAST PARTIAL MASTECTOMY WITH RADIOACTIVE SEED AND SENTINEL LYMPH NODE BIOPSY (Right Breast) INSERTION PORT-A-CATH WITH ULTRASOUND (Left Chest)  Patient Location: PACU  Anesthesia Type:General  Level of Consciousness: awake, drowsy and patient cooperative  Airway & Oxygen Therapy: Patient Spontanous Breathing and Patient connected to face mask oxygen  Post-op Assessment: Report given to RN and Post -op Vital signs reviewed and stable  Post vital signs: Reviewed and stable  Last Vitals:  Vitals Value Taken Time  BP 123/54 04/15/19 1336  Temp    Pulse 45 04/15/19 1339  Resp 16 04/15/19 1339  SpO2 98 % 04/15/19 1339  Vitals shown include unvalidated device data.  Last Pain:  Vitals:   04/15/19 0932  TempSrc:   PainSc: 0-No pain         Complications: No apparent anesthesia complications Dr. Sabra Heck requesting cardiology consult. EKG to follow.

## 2019-04-15 NOTE — Anesthesia Postprocedure Evaluation (Signed)
Anesthesia Post Note  Patient: [name]  Procedure(s) Performed: RIGHT BREAST PARTIAL MASTECTOMY WITH RADIOACTIVE SEED AND SENTINEL LYMPH NODE BIOPSY (Right Breast) INSERTION PORT-A-CATH WITH ULTRASOUND (Left Chest)     Patient location during evaluation: PACU Anesthesia Type: General Level of consciousness: awake and alert Pain management: pain level controlled Vital Signs Assessment: post-procedure vital signs reviewed and stable Respiratory status: spontaneous breathing, nonlabored ventilation and respiratory function stable Cardiovascular status: blood pressure returned to baseline and stable Postop Assessment: no apparent nausea or vomiting Anesthetic complications: no    Last Vitals:  Vitals:   04/15/19 1505 04/15/19 1515  BP: (!) 162/71 (!) 161/72  Pulse: 66 65  Resp: 18   Temp: 37.1 C   SpO2: 96% 96%    Last Pain:  Vitals:   04/15/19 1505  TempSrc: Oral  PainSc: 0-No pain                 Lynda Rainwater

## 2019-04-15 NOTE — Consult Note (Addendum)
Cardiology Consultation:   Patient ID: Margaret Norman MRN: [id number]; DOB: 08-14-1950  Admit date: 04/15/2019 Date of Consult: 04/15/2019  Primary Care Provider: [name], [place] Primary Cardiologist: Mertie Moores, MD  Primary Electrophysiologist:  None    Patient Profile:   Margaret Norman is a 68 y.o. female with a hx of left bundle branch block, type 2 diabetes, chest pain previously evaluated with myocardial perfusion imaging x2, thrombocytosis, hyperlipidemia, hypertension, and recent diagnosis of breast cancer who is being seen today for the evaluation of high-grade AV block at the request of Coralie Keens.  History of Present Illness:   Margaret Norman who was having a Port-A-Cath placed along with right partial mastectomy and axillary sentinel node seeding [place] when she developed severe bradycardia/high-grade AV block.  This occurred during insertion of the Port-A-Cath wire inserted.  She did not develop significant hypotension and required pressor therapy for recovery.  The case continued for an additional 45 to 60 minutes with ventricular rates in the 50-60 range and stable hemodynamics.  EKG immediately post procedure demonstrates persistent left bundle with high-grade AV block varying between 2-1 and 3-1 with an atrial rate of 125-130 and ventricular rate varying between 50 and 60 bpm.  The patient is still somewhat sedated but denies chest discomfort.  She denies shortness of breath.  The left bundle with first-degree AV block is longstanding at least back to 2017.  2 prior myocardial perfusion studies, most recently in 2017 were felt to be low risk.  Heart Pathway Score:     Past Medical History:  Diagnosis Date  . Arthritis   . Chest pain    Nuclear, adenosine,  December, 2013, low risk nuclear scan with small, moderate in intensity, fixed anteroseptal defect. This is possibly related to an LBBB versus small prior infarct. No ischemia  . Depression    . Gallstones 11-06  . GERD (gastroesophageal reflux disease)   . History of kidney stones   . HTN (hypertension)   . Hyperlipidemia   . Kidney mass 01/28/2014  . LBBB (left bundle branch block)   . Nephrolithiasis   . Pneumonia   . 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 Type II or unspecified type diabetes mellitus without mention of complication, not stated as uncontrolled     Past Surgical History:  Procedure Laterality Date  . APPENDECTOMY    . CESAREAN SECTION     x2 ? w/appy  . CHOLECYSTECTOMY    . ESOPHAGOGASTRODUODENOSCOPY    . HERNIA REPAIR    . KNEE ARTHROSCOPY Right 11/2005  . LIVER BIOPSY    . PANCREATIC CYST EXCISION    . SPLENECTOMY    . TONSILLECTOMY AND ADENOIDECTOMY       Home Medications:  Prior to Admission medications   Medication Sig Start Date End Date Taking? Authorizing Provider  acetaminophen (TYLENOL) 500 MG tablet Take 1,000 mg by mouth every 6 (six) hours as needed for moderate pain.   Yes [provider]  aspirin 81 MG EC tablet Take 81 mg by mouth daily.     Yes [provider]  buPROPion (WELLBUTRIN XL) 150 MG 24 hr tablet Take 1 tablet (150 mg total) by mouth every morning. 02/02/19  Yes [name], FNP  Cyanocobalamin (VITAMIN B12 PO) Take 2,500 mcg by mouth daily.   Yes [provider]  empagliflozin (JARDIANCE) 25 MG TABS tablet Take 25 mg by mouth daily. 09/23/18  Yes Philemon Kingdom, MD  LANTUS SOLOSTAR 100 UNIT/ML  Solostar Pen INJECT SUBCUTANEOUSLY 30  UNITS AT BEDTIME Patient taking differently: Inject 30 Units into the skin at bedtime.  01/21/19  Yes Renato Shin, MD  meloxicam (MOBIC) 15 MG tablet Take 15 mg by mouth daily.  01/16/18  Yes [provider]  metFORMIN (GLUCOPHAGE) 1000 MG tablet TAKE 1 TABLET BY MOUTH  TWICE DAILY WITH MEALS Patient taking differently: Take 1,000 mg by mouth 2 (two) times daily with a meal.  02/23/19  Yes Philemon Kingdom, MD  olmesartan (BENICAR)  40 MG tablet Take 1 tablet (40 mg total) by mouth daily. 02/02/19  Yes [name], FNP  omeprazole (PRILOSEC) 40 MG capsule TAKE 1 CAPSULE BY MOUTH  DAILY 04/10/19  Yes [name], FNP  Probiotic Product (PROBIOTIC ADVANCED PO) Take 1 tablet by mouth at bedtime.    Yes [provider]  pyridOXINE (VITAMIN B-6) 100 MG tablet Take 100 mg by mouth daily.    Yes [provider]  rosuvastatin (CRESTOR) 10 MG tablet Take 1 tablet (10 mg total) by mouth daily. 02/02/19 01/28/20 Yes [name], FNP  sertraline (ZOLOFT) 100 MG tablet Take 1 tablet (100 mg total) by mouth daily. 02/02/19  Yes [name], FNP  TRULICITY 1.5 XQ/1.1HE SOPN INJECT THE CONTENTS OF 1  PEN SUBCUTANEOUSLY WEEKLY  AS DIRECTED Patient taking differently: Take 3 mg by mouth every 7 (seven) days.  12/11/18  Yes Philemon Kingdom, MD  Vitamin E 180 MG CAPS Take 180 mg by mouth daily.   Yes [provider]  Blood Glucose Monitoring Suppl (ONETOUCH VERIO FLEX SYSTEM) W/DEVICE KIT 1 kit by Does not apply route 3 (three) times daily. 01/12/15   Janith Lima, MD  fluticasone (FLONASE) 50 MCG/ACT nasal spray Place 2 sprays into both nostrils daily. Patient not taking: Reported on 04/07/2019 07/07/18   [name], FNP  glucose blood [facility] VERIO) test strip Test three times daily as directed. DX: E11.9 12/24/16   Janith Lima, MD  Insulin Pen Needle (B-D ULTRAFINE III SHORT PEN) 31G X 8 MM MISC USE DAILY WITH LANTUS  INSULIN 01/21/19   Renato Shin, MD  ONE [facility] LANCETS MISC Test three times daily as directed. DX: E11.9 12/24/16   Janith Lima, MD    Inpatient Medications: Scheduled Meds: . Chlorhexidine Gluconate Cloth  6 each Topical Once   And  . Chlorhexidine Gluconate Cloth  6 each Topical Once   Continuous Infusions: . ciprofloxacin    . lactated ringers 10 mL/hr at 04/15/19 0939   PRN Meds: HYDROmorphone (DILAUDID) injection, meperidine  (DEMEROL) injection, oxyCODONE **OR** oxyCODONE, promethazine  Allergies:    Allergies  Allergen Reactions  . Invokana [Canagliflozin] Other (See Comments)    UTI's  . Amoxicillin     Sore tongue Did it involve swelling of the face/tongue/throat, SOB, or low BP? No Did it involve sudden or severe rash/hives, skin peeling, or any reaction on the inside of your mouth or nose? No Did you need to seek medical attention at a hospital or doctor's office? No When did it last happen?5-10 years ago If all above answers are "NO", may proceed with cephalosporin use.     Social History:   Social History   Socioeconomic History  . Marital status: Married    Spouse name: Davan Hark  . Number of children: 2  . Years of education: Not on file  . Highest education level: Not on file  Occupational History  . Occupation: CSR  Employer: [employer]  . Occupation: retired  Scientific laboratory technician  . Financial resource strain: Not very hard  . Food insecurity    Worry: Never true    Inability: Never true  . Transportation needs    Medical: No    Non-medical: No  Tobacco Use  . Smoking status: Never Smoker  . Smokeless tobacco: Never Used  Substance and Sexual Activity  . Alcohol use: No  . Drug use: No  . Sexual activity: Not Currently  Lifestyle  . Physical activity    Days per week: 0 days    Minutes per session: 0 min  . Stress: Not at all  Relationships  . Social connections    Talks on phone: More than three times a week    Gets together: More than three times a week    Attends religious service: Not on file    Active member of club or organization: Not on file    Attends meetings of clubs or organizations: Not on file    Relationship status: Married  . Intimate partner violence    Fear of current or ex partner: No    Emotionally abused: No    Physically abused: No    Forced sexual activity: No  Other Topics Concern  . Not on file  Social History Narrative    Regular Exercise -  NO    Family History:    Family History  Problem Relation Age of Onset  . Liver disease Mother   . Dementia Mother   . Diabetes Mother        borderline  . Coronary artery disease Father   . Heart attack Father   . Hypertension Father   . Heart disease Father   . Cancer Other        leukemia  . Stroke Maternal Grandfather   . Hyperlipidemia Brother        Amyloidosis  . Diabetes Maternal Grandmother   . Cancer Paternal Uncle        unknown  . Heart attack Paternal Grandmother   . Heart attack Paternal Uncle   . Hypertension Brother   . Diabetes Daughter        borderline  . Colon cancer Neg Hx      ROS:  Please see the history of present illness.  Recently diagnosed right breast cancer.  Surgery today as described above. All other ROS reviewed and negative.     Physical Exam/Data:   Vitals:   04/15/19 1336 04/15/19 1350 04/15/19 1410 04/15/19 1415  BP: (!) 123/54 (!) 125/56 (!) 122/55   Pulse: (!) 42 67 66   Resp: _0 Temp:    98.6 F (37 C)  TempSrc:      SpO2: 99% 96% 93%   Weight:      Height:        Intake/Output Summary (Last 24 hours) at 04/15/2019 1432 Last data filed at 04/15/2019 1308 Gross per 24 hour  Intake 1000 ml  Output 10 ml  Net 990 ml   Last 3 Weights 04/15/2019 04/07/2019 04/01/2019  Weight (lbs) 155 lb 155 lb 8 oz 155 lb  Weight (kg) 70.308 kg 70.534 kg 70.308 kg     Body mass index is 28.35 kg/m.  General: Moderate to severely obese well developed, in no acute distress HEENT: normal Lymph: no adenopathy Neck: no JVD Endocrine:  No thryomegaly Vascular: No carotid bruits; FA pulses 2+ bilaterally without bruits  Cardiac: No murmur  Lungs:  clear to auscultation bilaterally, no wheezing, rhonchi or rales  Abd: soft, nontender, no hepatomegaly  Ext: no edema Musculoskeletal:  No deformities, BUE and BLE strength normal and equal Skin: warm and dry  Neuro:  CNs 2-12 intact, no focal abnormalities  noted Psych:  Normal affect   EKG:  The EKG was personally reviewed and demonstrates: Sinus tachycardia at 120 to 130 bpm with variable AV conduction and ventricular rate between 48 to 54 bpm. Telemetry:  Telemetry was personally reviewed and demonstrates Wide-complex QRS with AV dissociation/high-grade AV block as described above on the EKG.  Relevant CV Studies: 2017 myocardial perfusion imaging: Study Highlights   Nuclear stress EF: 48%. There was septal hypokinesis consistent with left bundle branch block  There was no ST segment deviation noted during stress.  Defect 1: There is a small defect of moderate severity present in the mid anteroseptal and apical septal location. This defect was fixed and most likely associated with underlying left bundle branch block.  This is a low to intermediate risk study (based on ejection fraction less than 48%).   EF 48% with septal wall hypokinesis consistent with underlying left bundle branch block abnormality on EKG. There is a fixed anteroseptal distal defect most likely secondary to underlying left bundle branch block. No significant ischemia identified. Overall low to intermediate risk study based on ejection fraction of 48%, mildly reduced.  Candee Furbish, MD    2D Doppler echocardiogram April 2017: Study Conclusions  - Left ventricle: The cavity size was normal. Wall thickness was   increased in a pattern of mild LVH. Systolic function was normal.   The estimated ejection fraction was in the range of 50% to 55%.   Incoordinate septal motion. Doppler parameters are consistent   with abnormal left ventricular relaxation (grade 1 diastolic   dysfunction). The E/e&' ratio is >15, suggesting elevated LV   filling pressure. - Mitral valve: Calcified annulus. Mildly thickened leaflets .   There was mild regurgitation. - Left atrium: The atrium was normal in size. - Inferior vena cava: The vessel was normal in size. The   respirophasic  diameter changes were in the normal range (>= 50%),   consistent with normal central venous pressure.  Impressions:  - LVEF 50-55%, mild LVH, incoordinate septal motion, diastolic   dysfunction with elevated LV filling pressure, MAC with mild MR,   normal LA size, normal IVC.  Laboratory Data:  High Sensitivity Troponin:  No results for input(s): TROPONINIHS in the last 720 hours.   Chemistry Recent Labs  Lab 04/10/19 1027  NA 141  K 4.4  CL 107  CO2 24  GLUCOSE 143*  BUN 20  CREATININE 1.14*  CALCIUM 9.4  GFRNONAA 49*  GFRAA 57*  ANIONGAP 10    No results for input(s): PROT, ALBUMIN, AST, ALT, ALKPHOS, BILITOT in the last 168 hours. Hematology Recent Labs  Lab 04/10/19 1027  WBC 18.3*  RBC 4.27  HGB 12.4  HCT 40.2  MCV 94.1  MCH 29.0  MCHC 30.8  RDW 13.8  PLT 507*   BNPNo results for input(s): BNP, PROBNP in the last 168 hours.  DDimer No results for input(s): DDIMER in the last 168 hours.   Radiology/Studies:  Nm Sentinel Node Inj-no Rpt (breast)  Result Date: 04/15/2019 Sulfur colloid was injected by the nuclear medicine technologist for melanoma sentinel node.   Dg Chest Portable 1 View  Result Date: 04/15/2019 CLINICAL DATA:  Port-A-Cath placement. EXAM: PORTABLE CHEST 1 VIEW COMPARISON:  September 22, 2012. FINDINGS: Mild cardiomegaly is noted. Left subclavian Port-A-Cath is noted with distal tip in expected position of the SVC. No pneumothorax or pleural effusion is noted. Both lungs are clear. The visualized skeletal structures are unremarkable. IMPRESSION: Left subclavian Port-A-Cath is noted with distal tip in expected position of the SVC. No pneumothorax is noted. Electronically Signed   By: Marijo Conception M.D.   On: 04/15/2019 12:56   Dg Fluoro Guide Cv Line-no Report  Result Date: 04/15/2019 Fluoroscopy was utilized by the requesting physician.  No radiographic interpretation.   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 EXAM: MAMMOGRAPHIC GUIDED RADIOACTIVE SEED LOCALIZATION OF THE RIGHT BREAST COMPARISON:  Previous exam(s). FINDINGS: Patient presents for radioactive seed localization prior to lumpectomy. I met with the patient and we discussed the procedure of seed localization including benefits and alternatives. We discussed the high likelihood of a successful procedure. We discussed the risks of the procedure including infection, bleeding, tissue injury and further surgery. We discussed the low dose of radioactivity involved in the procedure. Informed, written consent was given. The usual time-out protocol was performed immediately prior to the procedure. Using mammographic guidance, sterile technique, 1% lidocaine and an I-125 radioactive seed, the ribbon shaped clip in the LATERAL portion of the RIGHT breast was localized using a it has LATERAL to MEDIAL approach. The follow-up mammogram images confirm the seed in the expected location and were marked for Dr. [name]. Follow-up survey of the patient confirms presence of the radioactive seed. Order number of I-125 seed:  [id number]. Total activity:  3.474 millicuries reference Date: 04/13/2019 The patient tolerated the procedure well and was released from the [place]. She was given instructions regarding seed removal. IMPRESSION: Radioactive seed localization right breast. No apparent complications. Electronically Signed   By: Nolon Nations M.D.   On: 04/14/2019 15:23    Assessment and Plan:   1. High-grade AV block with variable 2:1, 3:1, and 4:1 conduction due to intrinsic conduction system disease and superimposed stress of anesthesia/surgery.  There is a temporal relationship between development of bradycardia and insertion of Port-A-Cath wire which could have caused right bundle branch block  trauma resulting in high-grade AV block. This usually resolves quickly when catheter/wire removed. Currently still in high grade block with adequate HR and  hemodynamically stable.  We will repeat an echo, do serial troponin I determinations, monitor in the intensive care unit with external pacing pads in place, and have electrophysiology see the patient as there is high likelihood that she may require permanent pacemaker therapy.  All agents with AV nodal blocking capacity should be discontinued. 2. Essential hypertension 3. Diabetes mellitus 2 4. Hyperlipidemia 5. Recently diagnosed right breast cancer.      For questions or updates, please contact [place] Please consult [email or web address] for contact info under     Signed, Sinclair Grooms, MD  04/15/2019 2:32 PM

## 2019-04-15 NOTE — Progress Notes (Signed)
MD notified of pt HR of 120s. MD ordered 12 lead EKG. No further orders given at this time.

## 2019-04-15 NOTE — Anesthesia Procedure Notes (Signed)
Anesthesia Regional Block: Pectoralis block   Pre-Anesthetic Checklist: ,, timeout performed, Correct Patient, Correct Site, Correct Laterality, Correct Procedure, Correct Position, site marked, Risks and benefits discussed,  Surgical consent,  Pre-op evaluation,  At surgeon's request and post-op pain management  Laterality: Right  Prep: chloraprep       Needles:  Injection technique: Single-shot  Needle Type: Stimiplex     Needle Length: 9cm  Needle Gauge: 21     Additional Needles:   Procedures:,,,, ultrasound used (permanent image in chart),,,,  Narrative:  Start time: 04/15/2019 10:25 AM End time: 04/15/2019 10:30 AM Injection made incrementally with aspirations every 5 mL.  Performed by: Personally  Anesthesiologist: Lynda Rainwater, MD

## 2019-04-15 NOTE — Op Note (Signed)
Margaret Norman 04/15/2019   Pre-op Diagnosis: RIGHT BREAST CANCER     Post-op Diagnosis: same  Procedure(s): RIGHT BREAST PARTIAL MASTECTOMY WITH RADIOACTIVE SEED AND DEEP RIGHT AXILLARY SENTINEL LYMPH NODE BIOPSY INSERTION PORT-A-CATH WITH ULTRASOUND INJECTION OF BLUE DYE  Surgeon(s): Coralie Keens, MD  Anesthesia: General  Staff:  Circulator: Sharen Heck, RN; Diamantina Monks Benjaman Lobe, RN Radiology Technologist: Wynelle Beckmann Scrub Person: Quincy Carnes, RN  Estimated Blood Loss: Minimal               Specimens: SENT TO PATH  Indications: This is a 68 year old female with right breast cancer.  The decision has been made to proceed with a Port-A-Cath insertion along with a radioactive seed guided right breast partial mastectomy and sentinel lymph node biopsy  Findings: The radioactive seed and previous tissue marker were in the partial mastectomy specimen of the right breast.  This was confirmed by radiology.  2 sentinel lymph nodes were removed.  During the Port-A-Cath portion of the procedure, the patient developed worsening heart block.  Intraoperative chest x-ray was unremarkable.  Procedure: The patient was brought to the operating room and identifies correct patient.  She is placed upon the operating table general anesthesia was induced.  Her left neck and chest were then prepped and draped in usual sterile fashion.  Identified the left internal jugular vein with the ultrasound and then used introducer needle to cannulate the vein.  I passed a wire through the needle.  Under fluoroscopy this was confirmed to be in the central venous system.  I took multiple passes of the wire which kept going down the arm and back of the neck before it finally went into the central system.  I then anesthetized the skin of the chest with Marcaine and made incision with a scalpel and then created a port pocket with the cautery.  An 8 [place] clearly pause port was brought to the field.   It was flushed appropriately.  I made a incision at the wire site on the neck with a scalpel.  I then created a tunnel from the neck incision to the chest incision.  I then placed a port into the pocket and pulled the catheter up through the neck incision.  We then fed introducer sheath and dilator down the wire and then remove the wire in dilator.  The catheter was then fed down the peel-away sheath and sheath peeled away.  I accessed the port but could not get good return.  Fluoroscopy showed that the catheter kept going down the brachiocephalic vein as well as what appeared to be the azygos vein.  Despite multiple attempts, I could not get it redirected into the right location.  Even with placing the wire back through the catheter I cannot do this.  At this point decision was made to terminate the internal jugular part.  I removed the catheter and wire and held pressure.  Hemostasis peer to be achieved.  I then easily cannulate the left subclavian vein with introducer needle.  I passed the wire through the needle into the central venous system under direct fluoroscopy.  The wire suddenly caused further bradycardia.  I quickly placed introducer sheath dilator over the wire and remove the wire.  I then put the port into the pocket and fed the catheter down the peel-away sheath.  Fluoroscopy confirmed placement in the superior vena cava.  I accessed the port and good flush and return were demonstrated.  I then sewed  the port in place with 2 separate 3-0 Prolene sutures.  I then closed subcutaneous tissue with interrupted 3-0 Vicryl sutures to close the skin of both incisions with 4-0 Monocryl and Dermabond.  At this point, the patient continues remain in heart block with bradycardia.  We performed a chest x-ray Intra-Op and saw no evidence of pneumothorax or hemothorax with the port in good placement.  I discussed things further with anesthesia and they tell me to go ahead and proceed with the breast cancer portion  of the procedure.  Identified the radioactive seed in the lower outer quadrant of the right breast with the neoprobe.  I anesthetized skin with Marcaine along the inframammary ridge and made incision with scalpel.  I then performed a partial mastectomy removing most of the lower outer quadrant of the breast staying widely around the seed.  Once the specimen was removed, I marked the margins marker paint.  X-ray was performed on specimen confirming the radioactive seed and tissue marker were in the specimen.  It was then sent to pathology for evaluation.  I placed surgical clips around the biopsy cavity for marking purposes.  Hemostasis appeared to be achieved.  I then used the neoprobe to identify an area of increased uptake in the right axilla.  I anesthetized the skin with Marcaine.  I made incision with a scalpel.  I then dissected down to the deep axillary tissue.  Prior to this I injected blue dye underneath the areola and massaged the breast.  I was able to identify the sentinel lymph node with the neoprobe and uptake of blue dye.  An adjacent node appeared to be slightly enlarged and was removed with this.  Once the nodes were removed there was no other increased uptake of radioactive isotope in the nodal basin.  Hemostasis peer to be achieved in both incisions.  I then closed both incisions with interrupted 3-0 Vicryl sutures and closed skin with running 4-0 Monocryl.  Dermabond was then applied.  The patient tolerated the procedure.  She did require pressors and atropine during the procedure because of the heart block.  At this point decision was made to extubate her in the operating room and taken her in a guarded condition to the recovery room for further work-up from cardiology standpoint.  All counts were correct at the end of procedure.            Coralie Keens   Date: 04/15/2019  Time: 1:14 PM

## 2019-04-15 NOTE — Anesthesia Procedure Notes (Signed)
Procedure Name: LMA Insertion Date/Time: 04/15/2019 11:17 AM Performed by: Griffin Dakin, CRNA Pre-anesthesia Checklist: Patient identified, Emergency Drugs available, Suction available and Patient being monitored Patient Re-evaluated:Patient Re-evaluated prior to induction Oxygen Delivery Method: Circle system utilized Preoxygenation: Pre-oxygenation with 100% oxygen Induction Type: IV induction Ventilation: Mask ventilation without difficulty LMA: LMA inserted LMA Size: 4.0 Number of attempts: 1 Placement Confirmation: breath sounds checked- equal and bilateral and positive ETCO2 Tube secured with: Tape Dental Injury: Teeth and Oropharynx as per pre-operative assessment

## 2019-04-15 NOTE — Consult Note (Addendum)
Cardiology Consultation:   Patient ID: Margaret Norman MRN: [id number]; DOB: [birth year]  Admit date: 04/15/2019 Date of Consult: 04/15/2019  Primary Care Provider: [name], [place] Primary Cardiologist: Mertie Moores, MD  Primary Electrophysiologist:  None    Patient Profile:   Margaret Norman is a 68 y.o. female with a hx of HTN, DM, HLD, LBBB who is being seen today for the evaluation of advanced AV block at the request of Dr. Tamala Julian.  History of Present Illness:   Margaret Norman admitted for elective port placement and mastectomy with a recent new diagnosis of breast cancer. She has no known h/o CAD, LBBB dates back years, w/u in 2017 with no ischemia and TTE with preserved LVEF.  Inter-op RIGHT BREAST PARTIAL MASTECTOMY WITH RADIOACTIVE SEED AND DEEP RIGHT AXILLARY SENTINEL LYMPH NODE BIOPSY INSERTION PORT-A-CATH WITH ULTRASOUND She developed high degree AV block when the wire was passed for the port placement part of her procedure.   Treated with phenylepherine, atropine, and epinepherine They were able to complete the surgery    Post op, she is off pressor  Pre-op LABS 04/10/2019 K+ 4.4 BUN/Creat 20/1.14 WBC 18.3 H/H 12/40 Plts 507   Today HS Trop 34 BNP 86   Heart Pathway Score:     Past Medical History:  Diagnosis Date  . Arthritis   . Chest pain    Nuclear, adenosine,  December, 2013, low risk nuclear scan with small, moderate in intensity, fixed anteroseptal defect. This is possibly related to an LBBB versus small prior infarct. No ischemia  . Depression   . Gallstones 11-06  . GERD (gastroesophageal reflux disease)   . History of kidney stones   . HTN (hypertension)   . Hyperlipidemia   . Kidney mass 01/28/2014  . LBBB (left bundle branch block)   . Nephrolithiasis   . Pneumonia   . 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 Type II or unspecified type diabetes mellitus without mention of complication, not stated as uncontrolled      Past Surgical History:  Procedure Laterality Date  . APPENDECTOMY    . CESAREAN SECTION     x2 ? w/appy  . CHOLECYSTECTOMY    . ESOPHAGOGASTRODUODENOSCOPY    . HERNIA REPAIR    . KNEE ARTHROSCOPY Right 11/2005  . LIVER BIOPSY    . PANCREATIC CYST EXCISION    . SPLENECTOMY    . TONSILLECTOMY AND ADENOIDECTOMY       Home Medications:  Prior to Admission medications   Medication Sig Start Date End Date Taking? Authorizing Provider  acetaminophen (TYLENOL) 500 MG tablet Take 1,000 mg by mouth every 6 (six) hours as needed for moderate pain.   Yes [provider]  aspirin 81 MG EC tablet Take 81 mg by mouth daily.     Yes [provider]  buPROPion (WELLBUTRIN XL) 150 MG 24 hr tablet Take 1 tablet (150 mg total) by mouth every morning. 02/02/19  Yes [name], FNP  Cyanocobalamin (VITAMIN B12 PO) Take 2,500 mcg by mouth daily.   Yes [provider]  empagliflozin (JARDIANCE) 25 MG TABS tablet Take 25 mg by mouth daily. 09/23/18  Yes Philemon Kingdom, MD  LANTUS SOLOSTAR 100 UNIT/ML Solostar Pen INJECT SUBCUTANEOUSLY 30  UNITS AT BEDTIME Patient taking differently: Inject 30 Units into the skin at bedtime.  01/21/19  Yes Renato Shin, MD  meloxicam (MOBIC) 15 MG tablet Take 15 mg by mouth daily.  01/16/18  Yes [provider]  metFORMIN (GLUCOPHAGE) 1000 MG tablet TAKE 1 TABLET BY MOUTH  TWICE DAILY WITH MEALS Patient taking differently: Take 1,000 mg by mouth 2 (two) times daily with a meal.  02/23/19  Yes Philemon Kingdom, MD  olmesartan (BENICAR) 40 MG tablet Take 1 tablet (40 mg total) by mouth daily. 02/02/19  Yes [name], FNP  omeprazole (PRILOSEC) 40 MG capsule TAKE 1 CAPSULE BY MOUTH  DAILY 04/10/19  Yes [name], FNP  Probiotic Product (PROBIOTIC ADVANCED PO) Take 1 tablet by mouth at bedtime.    Yes [provider]  pyridOXINE (VITAMIN B-6) 100 MG tablet Take 100 mg by mouth daily.    Yes  [provider]  rosuvastatin (CRESTOR) 10 MG tablet Take 1 tablet (10 mg total) by mouth daily. 02/02/19 01/28/20 Yes [name], FNP  sertraline (ZOLOFT) 100 MG tablet Take 1 tablet (100 mg total) by mouth daily. 02/02/19  Yes [name], FNP  TRULICITY 1.5 BJ/6.2GB SOPN INJECT THE CONTENTS OF 1  PEN SUBCUTANEOUSLY WEEKLY  AS DIRECTED Patient taking differently: Take 3 mg by mouth every 7 (seven) days.  12/11/18  Yes Philemon Kingdom, MD  Vitamin E 180 MG CAPS Take 180 mg by mouth daily.   Yes [provider]  Blood Glucose Monitoring Suppl (ONETOUCH VERIO FLEX SYSTEM) W/DEVICE KIT 1 kit by Does not apply route 3 (three) times daily. 01/12/15   Janith Lima, MD  fluticasone (FLONASE) 50 MCG/ACT nasal spray Place 2 sprays into both nostrils daily. Patient not taking: Reported on 04/07/2019 07/07/18   [name], FNP  glucose blood [facility] VERIO) test strip Test three times daily as directed. DX: E11.9 12/24/16   Janith Lima, MD  Insulin Pen Needle (B-D ULTRAFINE III SHORT PEN) 31G X 8 MM MISC USE DAILY WITH LANTUS  INSULIN 01/21/19   Renato Shin, MD  ONE [facility] LANCETS MISC Test three times daily as directed. DX: E11.9 12/24/16   Janith Lima, MD    Inpatient Medications: Scheduled Meds: . Chlorhexidine Gluconate Cloth  6 each Topical Daily  . [START ON 04/16/2019] enoxaparin (LOVENOX) injection  40 mg Subcutaneous Q24H  . gabapentin  300 mg Oral BID  . insulin aspart  0-15 Units Subcutaneous TID WC  . insulin aspart  0-5 Units Subcutaneous QHS   Continuous Infusions: . 0.9 % NaCl with KCl 20 mEq / L    . ciprofloxacin    . lactated ringers 10 mL/hr at 04/15/19 0939   PRN Meds: diphenhydrAMINE **OR** diphenhydrAMINE, morphine injection, ondansetron **OR** ondansetron (ZOFRAN) IV, oxyCODONE, traMADol  Allergies:    Allergies  Allergen Reactions  . Invokana [Canagliflozin] Other (See Comments)    UTI's  . Amoxicillin     Sore  tongue Did it involve swelling of the face/tongue/throat, SOB, or low BP? No Did it involve sudden or severe rash/hives, skin peeling, or any reaction on the inside of your mouth or nose? No Did you need to seek medical attention at a hospital or doctor's office? No When did it last happen?5-10 years ago If all above answers are "NO", may proceed with cephalosporin use.     Social History:   Social History   Socioeconomic History  . Marital status: Married    Spouse name: Arthi Mcdonald  . Number of children: 2  . Years of education: Not on file  . Highest education level: Not on file  Occupational History  . Occupation: CSR    Employer: [employer]  . Occupation: retired  Scientific laboratory technician  . Financial resource strain: Not very hard  . Food insecurity    Worry: Never true    Inability: Never true  . Transportation needs    Medical: No    Non-medical: No  Tobacco Use  . Smoking status: Never Smoker  . Smokeless tobacco: Never Used  Substance and Sexual Activity  . Alcohol use: No  . Drug use: No  . Sexual activity: Not Currently  Lifestyle  . Physical activity    Days per week: 0 days    Minutes per session: 0 min  . Stress: Not at all  Relationships  . Social connections    Talks on phone: More than three times a week    Gets together: More than three times a week    Attends religious service: Not on file    Active member of club or organization: Not on file    Attends meetings of clubs or organizations: Not on file    Relationship status: Married  . Intimate partner violence    Fear of current or ex partner: No    Emotionally abused: No    Physically abused: No    Forced sexual activity: No  Other Topics Concern  . Not on file  Social History Narrative   Regular Exercise -  NO    Family History:   Family History  Problem Relation Age of Onset  . Liver disease Mother   . Dementia Mother   . Diabetes Mother        borderline  . Coronary artery  disease Father   . Heart attack Father   . Hypertension Father   . Heart disease Father   . Cancer Other        leukemia  . Stroke Maternal Grandfather   . Hyperlipidemia Brother        Amyloidosis  . Diabetes Maternal Grandmother   . Cancer Paternal Uncle        unknown  . Heart attack Paternal Grandmother   . Heart attack Paternal Uncle   . Hypertension Brother   . Diabetes Daughter        borderline  . Colon cancer Neg Hx      ROS:  Please see the history of present illness.  All other ROS reviewed and negative.     Physical Exam/Data:   Vitals:   04/15/19 1415 04/15/19 1457 04/15/19 1505 04/15/19 1515  BP:   (!) 162/71 (!) 161/72  Pulse:   66 65  Resp:   18   Temp: 98.6 F (37 C)  98.7 F (37.1 C)   TempSrc:   Oral   SpO2:  95% 96% 96%  Weight:      Height:        Intake/Output Summary (Last 24 hours) at 04/15/2019 1532 Last data filed at 04/15/2019 1308 Gross per 24 hour  Intake 1000 ml  Output 10 ml  Net 990 ml   Last 3 Weights 04/15/2019 04/07/2019 04/01/2019  Weight (lbs) 155 lb 155 lb 8 oz 155 lb  Weight (kg) 70.308 kg 70.534 kg 70.308 kg     Body mass index is 28.35 kg/m.  General:  Well nourished, well developed, in no acute distress HEENT: normal Lymph: no adenopathy Neck: no JVD Endocrine:  No thryomegaly Vascular: No carotid bruits Cardiac:  RRR; no murmurs, gallops or rubs (chest wall not examined) Lungs:  CTA b/l, no wheezing, rhonchi or rales  Abd: soft, nontender  Ext: no edema Musculoskeletal:  No deformities Skin: warm and dry  Neuro:  No gross focal abnormalities noted Psych:  Normal affect   EKG:  The EKG was personally reviewed and demonstrates:   CHB V rate 57 ( A rate 150), LBBB  9282020 SR 87, LBBB, borderline 1st degree AVblock  Telemetry:  Telemetry was personally reviewed and demonstrates:   2:1 AV block, V rates 60's   Relevant CV Studies:  10/11/2015: TTE Study Conclusions - Left ventricle: The cavity size  was normal. Wall thickness was   increased in a pattern of mild LVH. Systolic function was normal.   The estimated ejection fraction was in the range of 50% to 55%.   Incoordinate septal motion. Doppler parameters are consistent   with abnormal left ventricular relaxation (grade 1 diastolic   dysfunction). The E/e&' ratio is >15, suggesting elevated LV   filling pressure. - Mitral valve: Calcified annulus. Mildly thickened leaflets .   There was mild regurgitation. - Left atrium: The atrium was normal in size. - Inferior vena cava: The vessel was normal in size. The   respirophasic diameter changes were in the normal range (>= 50%),   consistent with normal central venous pressure.  Impressions: - LVEF 50-55%, mild LVH, incoordinate septal motion, diastolic   dysfunction with elevated LV filling pressure, MAC with mild MR,   normal LA size, normal IVC.   10/11/2015: Lexiscan stress test  Nuclear stress EF: 48%. There was septal hypokinesis consistent with left bundle branch block  There was no ST segment deviation noted during stress.  Defect 1: There is a small defect of moderate severity present in the mid anteroseptal and apical septal location. This defect was fixed and most likely associated with underlying left bundle branch block.  This is a low to intermediate risk study (based on ejection fraction less than 48%). EF 48% with septal wall hypokinesis consistent with underlying left bundle branch block abnormality on EKG. There is a fixed anteroseptal distal defect most likely secondary to underlying left bundle branch block. No significant ischemia identified. Overall low to intermediate risk study based on ejection fraction of 48%, mildly reduced.    Laboratory Data:  High Sensitivity Troponin:  No results for input(s): TROPONINIHS in the last 720 hours.   Chemistry Recent Labs  Lab 04/10/19 1027  NA 141  K 4.4  CL 107  CO2 24  GLUCOSE 143*  BUN 20  CREATININE  1.14*  CALCIUM 9.4  GFRNONAA 49*  GFRAA 57*  ANIONGAP 10    No results for input(s): PROT, ALBUMIN, AST, ALT, ALKPHOS, BILITOT in the last 168 hours. Hematology Recent Labs  Lab 04/10/19 1027  WBC 18.3*  RBC 4.27  HGB 12.4  HCT 40.2  MCV 94.1  MCH 29.0  MCHC 30.8  RDW 13.8  PLT 507*   BNPNo results for input(s): BNP, PROBNP in the last 168 hours.  DDimer No results for input(s): DDIMER in the last 168 hours.   Radiology/Studies:    Dg Chest Portable 1 View Result Date: 04/15/2019 CLINICAL DATA:  Port-A-Cath placement. EXAM: PORTABLE CHEST 1 VIEW COMPARISON:  September 22, 2012. FINDINGS: Mild cardiomegaly is noted. Left subclavian Port-A-Cath is noted with distal tip in expected position of the SVC. No pneumothorax or pleural effusion is noted. Both lungs are clear. The visualized skeletal structures are unremarkable. IMPRESSION: Left subclavian Port-A-Cath is noted with distal tip in expected position of the SVC. No pneumothorax is noted. Electronically Signed   By: Jeneen Rinks  Murlean Caller M.D.   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 EXAM: MAMMOGRAPHIC GUIDED RADIOACTIVE SEED LOCALIZATION OF THE RIGHT BREAST COMPARISON:  Previous exam(s). FINDINGS: Patient presents for radioactive seed localization prior to lumpectomy. I met with the patient and we discussed the procedure of seed localization including benefits and alternatives. We discussed the high likelihood of a successful procedure. We discussed the risks of the procedure including infection, bleeding, tissue injury and further surgery. We discussed the low dose of radioactivity involved in the procedure. Informed, written consent was given. The usual time-out protocol was performed immediately prior to the procedure. Using mammographic  guidance, sterile technique, 1% lidocaine and an I-125 radioactive seed, the ribbon shaped clip in the LATERAL portion of the RIGHT breast was localized using a it has LATERAL to MEDIAL approach. The follow-up mammogram images confirm the seed in the expected location and were marked for Dr. [name]. Follow-up survey of the patient confirms presence of the radioactive seed. Order number of I-125 seed:  [id number]. Total activity:  6.599 millicuries reference Date: 04/13/2019 The patient tolerated the procedure well and was released from the [facility]. She was given instructions regarding seed removal. IMPRESSION: Radioactive seed localization right breast. No apparent complications. Electronically Signed   By: Nolon Nations M.D.   On: 04/14/2019 15:23    Assessment and Plan:   1.  Inter-op bradycardia requiring intervention with drugs      Acute development of CHB/advanced heart block with port placement (wire placement)      Agree, likely 2/2 RBBB trauma with baseline LBBB      No nodal blocking agents on home med list or here  Off pressors now BP stable 2:1 AVblock on telemetry  Monitor in ICU, hopefully her conduction Tuere Nwosu return to baseline, though she may need pacing Unfortunately it looks they tried to place port on R side and were unable and ended up with the port Left sided and her mastectomy is R sided.  If pacing is needed, may need to consider micra AV device (?)  Continue primary care to attending service and primary cardiology service EP Ilithyia Titzer follow as well      For questions or updates, please contact [facility] Please consult [email or web address] for contact info under     Signed, Baldwin Jamaica, PA-C  04/15/2019 3:32 PM  I have seen and examined this patient with Tommye Standard.  Agree with above, note added to reflect my findings.  On exam, irregular, lungs clear. Patient with breast cancer s/p partial right mastectomy presented to the hospital for port  placement. Port eventually placed in the left chest. During the procedure, the patient went into complete heart block. She was resuscitated and now is in 2:1 AV block. She has a LBBB at baseline. It is likely that during the procedure, the wire bumped the right bundle or AV node and caused heart block. If the heart block persists, would need pacemaker implant. Would monitor her currently to see if conduction returns. Would hope to avoid pacemaker implant.   Reed Eifert M. Paisely Brick MD 04/15/2019 4:34 PM

## 2019-04-15 NOTE — Anesthesia Procedure Notes (Signed)
Arterial Line Insertion Start/End10/21/2020 1:00 PM, 04/15/2019 1:01 PM Performed by: Lynda Rainwater, MD, Griffin Dakin, RN  Preanesthetic checklist: patient identified, IV checked, site marked, risks and benefits discussed, surgical consent, monitors and equipment checked, pre-op evaluation, timeout performed and anesthesia consent Patient sedated Left, radial was placed Catheter size: 20 G Hand hygiene performed  and Seldinger technique used  Attempts: 2 Procedure performed without using ultrasound guided technique. Ultrasound Notes:anatomy identified Following insertion, dressing applied and Biopatch. Post procedure assessment: normal  Patient tolerated the procedure well with no immediate complications.

## 2019-04-16 ENCOUNTER — Encounter (HOSPITAL_COMMUNITY): Payer: Self-pay | Admitting: Surgery

## 2019-04-16 DIAGNOSIS — I441 Atrioventricular block, second degree: Secondary | ICD-10-CM

## 2019-04-16 LAB — CBC
HCT: 35.1 % — ABNORMAL LOW (ref 36.0–46.0)
Hemoglobin: 11.1 g/dL — ABNORMAL LOW (ref 12.0–15.0)
MCH: 29.2 pg (ref 26.0–34.0)
MCHC: 31.6 g/dL (ref 30.0–36.0)
MCV: 92.4 fL (ref 80.0–100.0)
Platelets: 430 10*3/uL — ABNORMAL HIGH (ref 150–400)
RBC: 3.8 MIL/uL — ABNORMAL LOW (ref 3.87–5.11)
RDW: 13.6 % (ref 11.5–15.5)
WBC: 27.4 10*3/uL — ABNORMAL HIGH (ref 4.0–10.5)
nRBC: 0 % (ref 0.0–0.2)

## 2019-04-16 LAB — BASIC METABOLIC PANEL
Anion gap: 13 (ref 5–15)
BUN: 17 mg/dL (ref 8–23)
CO2: 20 mmol/L — ABNORMAL LOW (ref 22–32)
Calcium: 9 mg/dL (ref 8.9–10.3)
Chloride: 109 mmol/L (ref 98–111)
Creatinine, Ser: 0.92 mg/dL (ref 0.44–1.00)
GFR calc Af Amer: >60 mL/min (ref >60)
GFR calc non Af Amer: >60 mL/min (ref >60)
Glucose, Bld: 95 mg/dL (ref 70–99)
Potassium: 4.1 mmol/L (ref 3.5–5.1)
Sodium: 142 mmol/L (ref 135–145)

## 2019-04-16 LAB — GLUCOSE, CAPILLARY
Glucose-Capillary: 86 mg/dL (ref 70–99)
Glucose-Capillary: 104 mg/dL — ABNORMAL HIGH (ref 70–99)

## 2019-04-16 MED ORDER — ROSUVASTATIN CALCIUM 5 MG PO TABS
10.0000 mg | ORAL_TABLET | Freq: Every day | ORAL | Status: DC
  Administered 2019-04-16: 10:00:00 10 mg via ORAL
  Filled 2019-04-16: qty 2

## 2019-04-16 MED ORDER — HYDROMORPHONE HCL 1 MG/ML IJ SOLN
INTRAMUSCULAR | Status: AC
  Filled 2019-04-16: qty 0.5

## 2019-04-16 NOTE — Discharge Instructions (Signed)
[facility] °Office Phone Number [phone] ° °BREAST BIOPSY/ PARTIAL MASTECTOMY: POST OP INSTRUCTIONS ° °Always review your discharge instruction sheet given to you by the facility where your surgery was performed. ° °IF YOU HAVE DISABILITY OR FAMILY LEAVE FORMS, YOU MUST BRING THEM TO THE OFFICE FOR PROCESSING.  DO NOT GIVE THEM TO YOUR DOCTOR. ° °1. A prescription for pain medication may be given to you upon discharge.  Take your pain medication as prescribed, if needed.  If narcotic pain medicine is not needed, then you may take acetaminophen (Tylenol) or ibuprofen (Advil) as needed. °2. Take your usually prescribed medications unless otherwise directed °3. If you need a refill on your pain medication, please contact your pharmacy.  They will contact our office to request authorization.  Prescriptions will not be filled after 5pm or on week-ends. °4. You should eat very light the first 24 hours after surgery, such as soup, crackers, pudding, etc.  Resume your normal diet the day after surgery. °5. Most patients will experience some swelling and bruising in the breast.  Ice packs and a good support bra will help.  Swelling and bruising can take several days to resolve.  °6. It is common to experience some constipation if taking pain medication after surgery.  Increasing fluid intake and taking a stool softener will usually help or prevent this problem from occurring.  A mild laxative (Milk of Magnesia or Miralax) should be taken according to package directions if there are no bowel movements after 48 hours. °7. Unless discharge instructions indicate otherwise, you may remove your bandages 24-48 hours after surgery, and you may shower at that time.  You may have steri-strips (small skin tapes) in place directly over the incision.  These strips should be left on the skin for 7-10 days.  If your surgeon used skin glue on the incision, you may shower in 24 hours.  The glue will flake off over the  next 2-3 weeks.  Any sutures or staples will be removed at the office during your follow-up visit. °8. ACTIVITIES:  You may resume regular daily activities (gradually increasing) beginning the next day.  Wearing a good support bra or sports bra minimizes pain and swelling.  You may have sexual intercourse when it is comfortable. °a. You may drive when you no longer are taking prescription pain medication, you can comfortably wear a seatbelt, and you can safely maneuver your car and apply brakes. °b. RETURN TO WORK:  ______________________________________________________________________________________ °9. You should see your doctor in the office for a follow-up appointment approximately two weeks after your surgery.  Your doctor’s nurse will typically make your follow-up appointment when she calls you with your pathology report.  Expect your pathology report 2-3 business days after your surgery.  You may call to check if you do not hear from us after three days. °10. OTHER INSTRUCTIONS: _______________________________________________________________________________________________ _____________________________________________________________________________________________________________________________________ °_____________________________________________________________________________________________________________________________________ °_____________________________________________________________________________________________________________________________________ ° °WHEN TO CALL YOUR DOCTOR: °1. Fever over 101.0 °2. Nausea and/or vomiting. °3. Extreme swelling or bruising. °4. Continued bleeding from incision. °5. Increased pain, redness, or drainage from the incision. ° °The clinic staff is available to answer your questions during regular business hours.  Please don’t hesitate to call and ask to speak to one of the nurses for clinical concerns.  If you have a medical emergency, go to the nearest  emergency room or call 911.  A surgeon from [facility] is always on call at the hospital. ° °For further questions, please visit [email or web address]  °

## 2019-04-16 NOTE — Discharge Summary (Signed)
Physician Discharge Summary  Patient ID: Margaret Norman MRN: [id number] DOB/AGE: 69/29/1952 69 y.o.  Admit date: 04/15/2019 Discharge date: 04/16/2019  Admission Diagnoses:  Discharge Diagnoses:  Active Problems:   Breast cancer ([place])   Second degree AV block   Discharged Condition: good  Hospital Course: patient admitted post op after developing significant heart block intra-op.  Monitored overnight with telemetry.  Cardiology did a formal consult.  On POD#1 she was doing well and after cleared by cardiology for discharged, I discharged her home.  Consults: cardiology  Significant Diagnostic Studies:   Treatments: surgery: left subclavian port a cath insertion, right breast radioactive seed guided partial mastectomy and sentinel node biopsy  Discharge Exam: Blood pressure 112/74, pulse (!) 104, temperature 98.4 F (36.9 C), temperature source Oral, resp. rate 18, height 5' 2"  (1.575 m), weight 70.3 kg, SpO2 94 %. General appearance: alert, cooperative and no distress Resp: clear to auscultation bilaterally Incision/Wound:incisions clean CV mildy tachy  Disposition: Discharge disposition: 01-Home or Self Care        Allergies as of 04/16/2019      Reactions   Invokana [canagliflozin] Other (See Comments)   UTI's   Amoxicillin    Sore tongue Did it involve swelling of the face/tongue/throat, SOB, or low BP? No Did it involve sudden or severe rash/hives, skin peeling, or any reaction on the inside of your mouth or nose? No Did you need to seek medical attention at a hospital or doctor's office? No When did it last happen?5-10 years ago If all above answers are "NO", may proceed with cephalosporin use.      Medication List    TAKE these medications   acetaminophen 500 MG tablet Commonly known as: TYLENOL Take 1,000 mg by mouth every 6 (six) hours as needed for moderate pain.   aspirin 81 MG EC tablet Take 81 mg by mouth daily.   B-D ULTRAFINE III  SHORT PEN 31G X 8 MM Misc Generic drug: Insulin Pen Needle USE DAILY WITH LANTUS  INSULIN   buPROPion 150 MG 24 hr tablet Commonly known as: WELLBUTRIN XL Take 1 tablet (150 mg total) by mouth every morning.   empagliflozin 25 MG Tabs tablet Commonly known as: Jardiance Take 25 mg by mouth daily.   fluticasone 50 MCG/ACT nasal spray Commonly known as: FLONASE Place 2 sprays into both nostrils daily.   glucose blood test strip Commonly known as: OneTouch Verio Test three times daily as directed. DX: E11.9   Lantus SoloStar 100 UNIT/ML Solostar Pen Generic drug: Insulin Glargine INJECT SUBCUTANEOUSLY 30  UNITS AT BEDTIME What changed:   how much to take  how to take this  when to take this  additional instructions   meloxicam 15 MG tablet Commonly known as: MOBIC Take 15 mg by mouth daily.   metFORMIN 1000 MG tablet Commonly known as: GLUCOPHAGE TAKE 1 TABLET BY MOUTH  TWICE DAILY WITH MEALS What changed:   how much to take  how to take this  when to take this  additional instructions   olmesartan 40 MG tablet Commonly known as: BENICAR Take 1 tablet (40 mg total) by mouth daily.   omeprazole 40 MG capsule Commonly known as: PRILOSEC TAKE 1 CAPSULE BY MOUTH  DAILY   ONE TOUCH LANCETS Misc Test three times daily as directed. DX: E11.9   OneTouch Verio Flex System w/Device Kit 1 kit by Does not apply route 3 (three) times daily.   PROBIOTIC ADVANCED PO Take 1 tablet by mouth  at bedtime.   pyridOXINE 100 MG tablet Commonly known as: VITAMIN B-6 Take 100 mg by mouth daily.   rosuvastatin 10 MG tablet Commonly known as: CRESTOR Take 1 tablet (10 mg total) by mouth daily.   sertraline 100 MG tablet Commonly known as: ZOLOFT Take 1 tablet (100 mg total) by mouth daily.   Trulicity 1.5 FU/0.7KT Sopn Generic drug: Dulaglutide INJECT THE CONTENTS OF 1  PEN SUBCUTANEOUSLY WEEKLY  AS DIRECTED What changed: See the new instructions.   VITAMIN B12  PO Take 2,500 mcg by mouth daily.   Vitamin E 180 MG Caps Take 180 mg by mouth daily.      Follow-up Information    Coralie Keens, MD. Schedule an appointment as soon as possible for a visit in 2 week(s).   Specialty: General Surgery Contact information: [place] [address] [place] [address] [phone]           Signed: Coralie Keens 04/16/2019, 9:57 AM

## 2019-04-16 NOTE — Progress Notes (Signed)
Spoke w/ pts dtr to provide updates. Pts dtr appreciative.  

## 2019-04-16 NOTE — Progress Notes (Signed)
1 Day Post-Op   Subjective/Chief Complaint: No complaints Reports minimal post op pain Had episodes of tachycardia overnight.  Denies any chest pain   Objective: Vital signs in last 24 hours: Temp:  [98.3 F (36.8 C)-98.7 F (37.1 C)] 98.7 F (37.1 C) (10/22 0340) Pulse Rate:  [42-130] 98 (10/22 0500) Resp:  [14-31] 18 (10/21 1600) BP: (101-175)/(52-104) 129/68 (10/22 0500) SpO2:  [93 %-100 %] 94 % (10/22 0500) Arterial Line BP: (87-202)/(49-82) 141/59 (10/22 0500) FiO2 (%):  [28 %] 28 % (10/21 1457) Weight:  [70.3 kg] 70.3 kg (10/21 0922)    Intake/Output from previous day: 10/21 0701 - 10/22 0700 In: 1962.2 [P.O.:240; I.V.:1722.2] Out: 2110 [Urine:2100; Blood:10] Intake/Output this shift: Total I/O In: 526.1 [I.V.:526.1] Out: 1300 [Urine:1300]  Exam: Awake and alert Incisions clean without hematoma Lungs clear CV tachy  Lab Results:  Recent Labs    04/16/19 0430  WBC 27.4*  HGB 11.1*  HCT 35.1*  PLT 430*   BMET Recent Labs    04/15/19 2030 04/16/19 0430  NA 140 142  K 4.2 4.1  CL 108 109  CO2 22 20*  GLUCOSE 138* 95  BUN 20 17  CREATININE 1.03* 0.92  CALCIUM 9.3 9.0   PT/INR No results for input(s): LABPROT, INR in the last 72 hours. ABG No results for input(s): PHART, HCO3 in the last 72 hours.  Invalid input(s): PCO2, PO2  Studies/Results: Nm Sentinel Node Inj-no Rpt (breast)  Result Date: 04/15/2019 Sulfur colloid was injected by the nuclear medicine technologist for melanoma sentinel node.   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 Status post surgical excision. EXAM: SPECIMEN RADIOGRAPH OF THE RIGHT BREAST COMPARISON:  Previous exam(s). FINDINGS: Status post excision of the right breast. The radioactive seed and biopsy marker clip are present, completely intact, and were marked for pathology. IMPRESSION: Specimen radiograph of the right breast.  Electronically Signed   By: Dina  Arceo M.D.   On: 04/15/2019 12:56   Dg Chest Portable 1 View  Result Date: 04/15/2019 CLINICAL DATA:  Port-A-Cath placement. EXAM: PORTABLE CHEST 1 VIEW COMPARISON:  September 22, 2012. FINDINGS: Mild cardiomegaly is noted. Left subclavian Port-A-Cath is noted with distal tip in expected position of the SVC. No pneumothorax or pleural effusion is noted. Both lungs are clear. The visualized skeletal structures are unremarkable. IMPRESSION: Left subclavian Port-A-Cath is noted with distal tip in expected position of the SVC. No pneumothorax is noted. Electronically Signed   By: James  Green Jr M.D.   On: 04/15/2019 12:56   Dg Fluoro Guide Cv Line-no Report  Result Date: 04/15/2019 Fluoroscopy was utilized by the requesting physician.  No radiographic interpretation.   Mm Rt Radioactive Seed Loc Mammo Guide  Result Date: 04/14/2019 CLINICAL DATA:  The patient presents for seed localization of the RIGHT breast prior to lumpectomy for previously biopsied grade 2 invasive ductal carcinoma. EXAM: MAMMOGRAPHIC GUIDED RADIOACTIVE SEED LOCALIZATION OF THE RIGHT BREAST COMPARISON:  Previous exam(s). FINDINGS: Patient presents for radioactive seed localization prior to lumpectomy. I met with the patient and we discussed the procedure of seed localization including benefits and alternatives. We discussed the high likelihood of a successful procedure. We discussed the risks of the procedure including infection, bleeding, tissue injury and further surgery. We discussed the low dose of radioactivity involved in the procedure. Informed, written consent was given. The usual time-out protocol was performed immediately prior to the procedure. Using mammographic guidance, sterile technique,   1% lidocaine and an I-125 radioactive seed, the ribbon shaped clip in the LATERAL portion of the RIGHT breast was localized using a it has LATERAL to MEDIAL approach. The follow-up mammogram images confirm  the seed in the expected location and were marked for Dr. [name]. Follow-up survey of the patient confirms presence of the radioactive seed. Order number of I-125 seed:  [id number]. Total activity:  1.157 millicuries reference Date: 04/13/2019 The patient tolerated the procedure well and was released from the [facility]. She was given instructions regarding seed removal. IMPRESSION: Radioactive seed localization right breast. No apparent complications. Electronically Signed   By: Nolon Nations M.D.   On: 04/14/2019 15:23    Anti-infectives: Anti-infectives (From admission, onward)   Start     Dose/Rate Route Frequency Ordered Stop   04/15/19 0930  ciprofloxacin (CIPRO) IVPB 400 mg     400 mg 200 mL/hr over 60 Minutes Intravenous On call to O.R. 04/15/19 0919 04/15/19 1117   04/15/19 0922  ciprofloxacin (CIPRO) 400 MG/200ML IVPB    Note to Pharmacy: Gustavo Lah   : cabinet override      04/15/19 0922 04/15/19 2129      Assessment/Plan: s/p Procedure(s): RIGHT BREAST PARTIAL MASTECTOMY WITH RADIOACTIVE SEED AND SENTINEL LYMPH NODE BIOPSY (Right) INSERTION PORT-A-CATH WITH ULTRASOUND (Left)  Stable post op.  Awaiting final path on margins and nodes regarding right breast cancer  Appreciate Cardiology consult.  Will discharge when it is ok from their standpoint   LOS: 1 day    Coralie Keens MD 04/16/2019

## 2019-04-16 NOTE — Progress Notes (Addendum)
The patient has been seen in conjunction with Nathanial Rancher, MD. All aspects of care have been considered and discussed. The patient has been personally interviewed, examined, and all clinical data has been reviewed.   Back NSR without high grade AV block.  Okay to DC.  F/u with Dr. Acie Fredrickson.  Progress Note  Patient Name: Margaret Norman Date of Encounter: 04/16/2019  Primary Cardiologist: Mertie Moores, MD   Subjective   She is doing well this morning postoperatively and denies chest pain, shortness of breath or any symptoms of dysrhythmia  Inpatient Medications    Scheduled Meds: . Chlorhexidine Gluconate Cloth  6 each Topical Daily  . enoxaparin (LOVENOX) injection  40 mg Subcutaneous Q24H  . gabapentin  300 mg Oral BID  . insulin aspart  0-15 Units Subcutaneous TID WC  . insulin aspart  0-5 Units Subcutaneous QHS  . mouth rinse  15 mL Mouth Rinse BID   Continuous Infusions: . 0.9 % NaCl with KCl 20 mEq / L 75 mL/hr at 04/16/19 0500  . lactated ringers 10 mL/hr at 04/15/19 0939   PRN Meds: diphenhydrAMINE **OR** diphenhydrAMINE, hydrALAZINE, morphine injection, ondansetron **OR** ondansetron (ZOFRAN) IV, oxyCODONE, traMADol   Vital Signs    Vitals:   04/16/19 0300 04/16/19 0340 04/16/19 0400 04/16/19 0500  BP: 125/63  113/65 129/68  Pulse: (!) 106  (!) 105 98  Resp:      Temp:  98.7 F (37.1 C)    TempSrc:  Oral    SpO2: 95%  94% 94%  Weight:      Height:        Intake/Output Summary (Last 24 hours) at 04/16/2019 1607 Last data filed at 04/16/2019 0500 Gross per 24 hour  Intake 1962.16 ml  Output 2110 ml  Net -147.84 ml   Filed Weights   04/15/19 0922  Weight: 70.3 kg    Telemetry    Sinus tachycardia- Personally Reviewed  ECG    Sinus tachycardia with LBBB- Personally Reviewed  Physical Exam   GEN: No acute distress.   Cardiac:  Tachycardia, no murmurs, rubs, or gallops.  Respiratory: Clear to auscultation bilaterally. GI: Soft,  nontender, non-distended  MS: No edema; No deformity. Neuro:  Nonfocal  Psych: Normal affect  Skin: Port-A-Cath on left side of chest, surgical incision of the right breast is clean.  Labs    Chemistry Recent Labs  Lab 04/10/19 1027 04/15/19 2030 04/16/19 0430  NA 141 140 142  K 4.4 4.2 4.1  CL 107 108 109  CO2 24 22 20*  GLUCOSE 143* 138* 95  BUN _0 CREATININE 1.14* 1.03* 0.92  CALCIUM 9.4 9.3 9.0  GFRNONAA 49* 56* >60  GFRAA 57* >60 >60  ANIONGAP _1 Hematology Recent Labs  Lab 04/10/19 1027 04/16/19 0430  WBC 18.3* 27.4*  RBC 4.27 3.80*  HGB 12.4 11.1*  HCT 40.2 35.1*  MCV 94.1 92.4  MCH 29.0 29.2  MCHC 30.8 31.6  RDW 13.8 13.6  PLT 507* 430*    Cardiac EnzymesNo results for input(s): TROPONINI in the last 168 hours. No results for input(s): TROPIPOC in the last 168 hours.   BNP Recent Labs  Lab 04/15/19 1455  BNP 86.2     DDimer No results for input(s): DDIMER in the last 168 hours.   Radiology    Nm Sentinel Node Inj-no Rpt (breast)  Result Date: 04/15/2019 Sulfur colloid was injected by the nuclear medicine technologist for melanoma  sentinel node.   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 Status post surgical excision. EXAM: SPECIMEN RADIOGRAPH OF THE RIGHT BREAST COMPARISON:  Previous exam(s). FINDINGS: Status post excision of the right breast. The radioactive seed and biopsy marker clip are present, completely intact, and were marked for pathology. IMPRESSION: Specimen radiograph of the right breast. Electronically Signed   By: Lillia Mountain M.D.   On: 04/15/2019 12:56   Dg Chest Portable 1 View  Result Date: 04/15/2019 CLINICAL DATA:  Port-A-Cath placement. EXAM: PORTABLE CHEST 1 VIEW COMPARISON:  September 22, 2012. FINDINGS: Mild cardiomegaly is noted. Left subclavian Port-A-Cath is noted with distal tip in expected position of the SVC. No  pneumothorax or pleural effusion is noted. Both lungs are clear. The visualized skeletal structures are unremarkable. IMPRESSION: Left subclavian Port-A-Cath is noted with distal tip in expected position of the SVC. No pneumothorax is noted. Electronically Signed   By: Marijo Conception M.D.   On: 04/15/2019 12:56   Dg Fluoro Guide Cv Line-no Report  Result Date: 04/15/2019 Fluoroscopy was utilized by the requesting physician.  No radiographic interpretation.   Mm Rt Radioactive Seed Loc Mammo Guide  Result Date: 04/14/2019 CLINICAL DATA:  The patient presents for seed localization of the RIGHT breast prior to lumpectomy for previously biopsied grade 2 invasive ductal carcinoma. EXAM: MAMMOGRAPHIC GUIDED RADIOACTIVE SEED LOCALIZATION OF THE RIGHT BREAST COMPARISON:  Previous exam(s). FINDINGS: Patient presents for radioactive seed localization prior to lumpectomy. I met with the patient and we discussed the procedure of seed localization including benefits and alternatives. We discussed the high likelihood of a successful procedure. We discussed the risks of the procedure including infection, bleeding, tissue injury and further surgery. We discussed the low dose of radioactivity involved in the procedure. Informed, written consent was given. The usual time-out protocol was performed immediately prior to the procedure. Using mammographic guidance, sterile technique, 1% lidocaine and an I-125 radioactive seed, the ribbon shaped clip in the LATERAL portion of the RIGHT breast was localized using a it has LATERAL to MEDIAL approach. The follow-up mammogram images confirm the seed in the expected location and were marked for Dr. [name]. Follow-up survey of the patient confirms presence of the radioactive seed. Order number of I-125 seed:  [id number]. Total activity:  2.202 millicuries reference Date: 04/13/2019 The patient tolerated the procedure well and was released from the [facility]. She was given  instructions regarding seed removal. IMPRESSION: Radioactive seed localization right breast. No apparent complications. Electronically Signed   By: Nolon Nations M.D.   On: 04/14/2019 15:23    Cardiac Studies   2017 myocardial perfusion imaging: Study Highlights   Nuclear stress EF: 48%. There was septal hypokinesis consistent with left bundle branch block  There was no ST segment deviation noted during stress.  Defect 1: There is a small defect of moderate severity present in the mid anteroseptal and apical septal location. This defect was fixed and most likely associated with underlying left bundle branch block.  This is a low to intermediate risk study (based on ejection fraction less than 48%).  EF 48% with septal wall hypokinesis consistent with underlying left bundle branch block abnormality on EKG. There is a fixed anteroseptal distal defect most likely secondary to underlying left bundle branch block. No significant ischemia identified. Overall low to intermediate risk study based on ejection fraction of 48%, mildly reduced.  Candee Furbish, MD    2D  Doppler echocardiogram April 2017: Study Conclusions  - Left ventricle: The cavity size was normal. Wall thickness was increased in a pattern of mild LVH. Systolic function was normal. The estimated ejection fraction was in the range of 50% to 55%. Incoordinate septal motion. Doppler parameters are consistent with abnormal left ventricular relaxation (grade 1 diastolic dysfunction). The E/e&' ratio is >15, suggesting elevated LV filling pressure. - Mitral valve: Calcified annulus. Mildly thickened leaflets . There was mild regurgitation. - Left atrium: The atrium was normal in size. - Inferior vena cava: The vessel was normal in size. The respirophasic diameter changes were in the normal range (>= 50%), consistent with normal central venous pressure.  Impressions:  - LVEF 50-55%, mild LVH,  incoordinate septal motion, diastolic dysfunction with elevated LV filling pressure, MAC with mild MR, normal LA size, normal IVC.   Patient Profile     TERRAN HOLLENKAMP is a 68 y.o. female with a hx of left bundle branch block, type 2 diabetes, chest pain previously evaluated with myocardial perfusion imaging x2, thrombocytosis, hyperlipidemia, hypertension, and recent diagnosis of breast cancer who is being seen today for the evaluation of high-grade AV block   Assessment & Plan    #High-grade AV block secondary to intraoperative RBBB trauma with baseline LBBB She was found to have an intraoperative high-grade AV block with variable 2: 1, 3: 1 and 4: 1 conduction that was thought to be secondary to intrinsic conduction system in addition to stress that she sustained during anesthesia and surgery.  Overnight, she had episodes of tachycardia up to the 130s though she remained hemodynamically stable.  EKG this a.m. shows resolution of her AV block and reveals sinus tachycardia with HR of 102 and LBBB -Avoid nodal blocking agents moving forward at least temporarily.  #Right breast cancer status post right partial mastectomy with radioactive seed and axillary sentinel lymph node biopsy, insertion of Port-A-Cath -Continue postoperative management  #Essential hypertension Noted to be hypertensive overnight and received IV hydralazine BP is currently stable 112/74 -As needed hydralazine with SBP >150  #Type 2 diabetes mellitus -Continue insulin  #Hyperlipidemia -Continue Crestor   For questions or updates, please contact [facility] Please consult [email or web address] for contact info under Cardiology/STEMI.      Signed, Jean Rosenthal, MD  04/16/2019, 6:33 AM

## 2019-04-16 NOTE — Progress Notes (Signed)
Discharge instructions, medications, follow up appointments reviewed w/ pt. Pt states understanding, states no questions or concerns.  Second copy made for pts dtr per request.  Pt now requesting prescription for pain meds.  Spoke w/ Dr. [name] who states no longer at hospital, however will send prescription to pts pharmacy when able.  RN relayed this to pt who states understanding and appreciative.  Pts husband at bedside. Pt taken to [facility]	[place] entrance by wheelchair. Pts dtr to pick pt up at [facility]

## 2019-04-16 NOTE — Progress Notes (Signed)
Telemetry reviewed SR/ST, 90's predominantly-100's with 1:1 conduction She has regained AV conduction EKG is st 102bpm, LBBB (known for her baseline), PR 261ms  Avoid any/all nodal blocking/rate limiting medicines Hopefully can avoid PPM implant  EP will sign off, though remain available.  Please recall if needed.  Tommye Standard, PA-C

## 2019-04-20 ENCOUNTER — Encounter: Payer: Self-pay | Admitting: *Deleted

## 2019-04-20 ENCOUNTER — Encounter (INDEPENDENT_AMBULATORY_CARE_PROVIDER_SITE_OTHER): Payer: Self-pay

## 2019-04-20 LAB — SURGICAL PATHOLOGY

## 2019-04-21 NOTE — Progress Notes (Signed)
Patient Care Team: [name], [place] as PCP - General (Internal Medicine) [name], [name], MD as PCP - Cardiology (Cardiology) [name], RN as Oncology Nurse Navigator [name], RN as Oncology Nurse Navigator  DIAGNOSIS:    ICD-10-CM   1. Malignant neoplasm of upper-outer quadrant of right breast in female, estrogen receptor positive ([place])  C50.411    Z17.0     SUMMARY OF ONCOLOGIC HISTORY: Oncology History  Malignant neoplasm of upper-outer quadrant of right breast in female, estrogen receptor positive ([place])  03/16/2019 Cancer Staging   Staging form: Breast, AJCC 8th Edition - Clinical stage from 03/16/2019: Stage IA (cT1c, cN0, cM0, G2, ER+, PR+, HER2+) - Signed by [name], NP on 03/25/2019   03/25/2019 Initial Diagnosis   Routine screening mammogram detected a 1.3cm mass in the right breast, 9 o'clock position, no axillary adenopathy. 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    04/15/2019 Surgery   Right lumpectomy [name]): IDC, grade 2, 1.3cm, with intermediate grade DCIS, clear margins, and 1 right axillary lymph node negative     CHIEF COMPLIANT: Follow-up s/p lumpectomy to discuss pathology report   INTERVAL HISTORY: Margaret Norman is a 68 y.o. with above-mentioned history of right breast cancer. She underwent a lumpectomy on 04/15/19 with Dr. 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 She presents to the clinic today to review the pathology report and discuss further treatment.    REVIEW OF SYSTEMS:   Constitutional: Denies fevers, chills or abnormal weight loss Eyes: Denies blurriness of vision Ears, nose, mouth, throat, and face: Denies mucositis or sore throat Respiratory: Denies cough, dyspnea or wheezes Cardiovascular: Denies  palpitation, chest discomfort Gastrointestinal: Denies nausea, heartburn or change in bowel habits Skin: Denies abnormal skin rashes Lymphatics: Denies new lymphadenopathy or easy bruising Neurological: Denies numbness, tingling or new weaknesses Behavioral/Psych: Mood is stable, no new changes  Extremities: No lower extremity edema Breast: denies any pain or lumps or nodules in either breasts All other systems were reviewed with the patient and are negative.  I have reviewed the past medical history, past surgical history, social history and family history with the patient and they are unchanged from previous note.  ALLERGIES:  is allergic to invokana [canagliflozin] and amoxicillin.  MEDICATIONS:  Current Outpatient Medications  Medication Sig Dispense Refill  . acetaminophen (TYLENOL) 500 MG tablet Take 1,000 mg by mouth every 6 (six) hours as needed for moderate pain.    Marland Kitchen aspirin 81 MG EC tablet Take 81 mg by mouth daily.      . Blood Glucose Monitoring Suppl (ONETOUCH VERIO FLEX SYSTEM) W/DEVICE KIT 1 kit by Does not apply route 3 (three) times daily. 1 kit 1  . buPROPion (WELLBUTRIN XL) 150 MG 24 hr tablet Take 1 tablet (150 mg total) by mouth every morning. 90 tablet 3  . Cyanocobalamin (VITAMIN B12 PO) Take 2,500 mcg by mouth daily.    . empagliflozin (JARDIANCE) 25 MG TABS tablet Take 25 mg by mouth daily. 30 tablet 11  . fluticasone (FLONASE) 50 MCG/ACT nasal spray Place 2 sprays into both nostrils daily. (Patient not taking: Reported on 04/07/2019) 16 g 6  . glucose blood (ONETOUCH VERIO) test strip Test three times daily as directed. DX: E11.9 300 each 3  . Insulin Pen Needle (B-D  ULTRAFINE III SHORT PEN) 31G X 8 MM MISC USE DAILY WITH LANTUS  INSULIN 90 each 1  . LANTUS SOLOSTAR 100 UNIT/ML Solostar Pen INJECT SUBCUTANEOUSLY 30  UNITS AT BEDTIME (Patient taking differently: Inject 30 Units into the skin at bedtime. ) 30 mL 3  . meloxicam (MOBIC) 15 MG tablet Take 15 mg by  mouth daily.     . metFORMIN (GLUCOPHAGE) 1000 MG tablet TAKE 1 TABLET BY MOUTH  TWICE DAILY WITH MEALS (Patient taking differently: Take 1,000 mg by mouth 2 (two) times daily with a meal. ) 180 tablet 3  . olmesartan (BENICAR) 40 MG tablet Take 1 tablet (40 mg total) by mouth daily. 90 tablet 3  . omeprazole (PRILOSEC) 40 MG capsule TAKE 1 CAPSULE BY MOUTH  DAILY 90 capsule 3  . ONE TOUCH LANCETS MISC Test three times daily as directed. DX: E11.9 300 each 3  . Probiotic Product (PROBIOTIC ADVANCED PO) Take 1 tablet by mouth at bedtime.     . pyridOXINE (VITAMIN B-6) 100 MG tablet Take 100 mg by mouth daily.     . rosuvastatin (CRESTOR) 10 MG tablet Take 1 tablet (10 mg total) by mouth daily. 90 tablet 3  . sertraline (ZOLOFT) 100 MG tablet Take 1 tablet (100 mg total) by mouth daily. 90 tablet 3  . TRULICITY 1.5 WL/7.9GX SOPN INJECT THE CONTENTS OF 1  PEN SUBCUTANEOUSLY WEEKLY  AS DIRECTED (Patient taking differently: Take 3 mg by mouth every 7 (seven) days. ) 6 mL 2  . Vitamin E 180 MG CAPS Take 180 mg by mouth daily.     No current facility-administered medications for this visit.     PHYSICAL EXAMINATION: ECOG PERFORMANCE STATUS: 1 - Symptomatic but completely ambulatory  Vitals:   04/22/19 1018  BP: (!) 155/70  Pulse: 94  Resp: 17  Temp: 98.2 F (36.8 C)  SpO2: 99%   Filed Weights   04/22/19 1018  Weight: 151 lb 8 oz (68.7 kg)    GENERAL: alert, no distress and comfortable SKIN: skin color, texture, turgor are normal, no rashes or significant lesions EYES: normal, Conjunctiva are pink and non-injected, sclera clear OROPHARYNX: no exudate, no erythema and lips, buccal mucosa, and tongue normal  NECK: supple, thyroid normal size, non-tender, without nodularity LYMPH: no palpable lymphadenopathy in the cervical, axillary or inguinal LUNGS: clear to auscultation and percussion with normal breathing effort HEART: regular rate & rhythm and no murmurs and no lower extremity edema  ABDOMEN: abdomen soft, non-tender and normal bowel sounds MUSCULOSKELETAL: no cyanosis of digits and no clubbing  NEURO: alert & oriented x 3 with fluent speech, no focal motor/sensory deficits EXTREMITIES: No lower extremity edema  LABORATORY DATA:  I have reviewed the data as listed CMP Latest Ref Rng & Units 04/16/2019 04/15/2019 04/10/2019  Glucose 70 - 99 mg/dL 95 138(H) 143(H)  BUN 8 - 23 mg/dL 17 20 20   Creatinine 0.44 - 1.00 mg/dL 0.92 1.03(H) 1.14(H)  Sodium 135 - 145 mmol/L 142 140 141  Potassium 3.5 - 5.1 mmol/L 4.1 4.2 4.4  Chloride 98 - 111 mmol/L 109 108 107  CO2 22 - 32 mmol/L 20(L) 22 24  Calcium 8.9 - 10.3 mg/dL 9.0 9.3 9.4  Total Protein 6.0 - 8.3 g/dL - - -  Total Bilirubin 0.2 - 1.2 mg/dL - - -  Alkaline Phos 39 - 117 U/L - - -  AST 0 - 37 U/L - - -  ALT 0 - 35 U/L - - -  Lab Results  Component Value Date   WBC 27.4 (H) 04/16/2019   HGB 11.1 (L) 04/16/2019   HCT 35.1 (L) 04/16/2019   MCV 92.4 04/16/2019   PLT 430 (H) 04/16/2019   NEUTROABS 8.4 (H) 02/02/2019    ASSESSMENT & PLAN:  Malignant neoplasm of upper-outer quadrant of right breast in female, estrogen receptor positive ([place]) Routine screening mammogram detected a 1.3cm mass in the right breast, 9 o'clock position, no axillary adenopathy. 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  T1c N0 stage Ia  Recommendations: 1. 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 Adj chemo with Taxol-Herceptin foll by Herceptin maintenance for 1 year 3. Adjuvant radiation therapy followed by 4. Adjuvant antiestrogen therapy  I talked to her about UPBEAT and SWOG 1714 trials. Will give her more info after surgery. -------------------------------------------------------------------------------------------------------------------------  Chemo counseling: I discussed the chemotherapy regimen in more detail. Plan to start chemotherapy in 3 weeks.     No orders of the defined types were placed in this encounter.  The patient has a good understanding of the overall plan. she agrees with it. she will call with any problems that may develop before the next visit here.  Nicholas Lose, MD 04/22/2019  Margaret Norman am acting as scribe for Dr. Nicholas Lose.  I have reviewed the above documentation for accuracy and completeness, and I agree with the above.

## 2019-04-22 ENCOUNTER — Encounter: Payer: Self-pay | Admitting: Family

## 2019-04-22 ENCOUNTER — Encounter: Payer: Self-pay | Admitting: *Deleted

## 2019-04-22 ENCOUNTER — Inpatient Hospital Stay: Payer: Medicare Other | Admitting: Hematology and Oncology

## 2019-04-22 ENCOUNTER — Other Ambulatory Visit: Payer: Self-pay

## 2019-04-22 DIAGNOSIS — Z170 Estrogen receptor positive status [ER+]: Secondary | ICD-10-CM

## 2019-04-22 DIAGNOSIS — C50411 Malignant neoplasm of upper-outer quadrant of right female breast: Secondary | ICD-10-CM

## 2019-04-22 NOTE — Research (Signed)
04/22/19 at 11:43am- [id number]- Dr. Lindi Adie referred this pt to the [id number] UpBeat study.  The research nurse met with the pt for 20 minutes going over the study details.  The research nurse informed the pt that her participation is completely voluntary.  The pt was made aware of all of the study requirements and time points.  The pt agreed to read over the consent form and hipaa form.  The research nurse and the pt agreed to discuss the study and her participation next Wednesday, 11/4.  The pt was thanked for her interest in the study.  Brion Aliment RN, BSN, CCRP Clinical Research Nurse 04/22/2019 11:51 AM

## 2019-04-22 NOTE — Progress Notes (Signed)
START ON PATHWAY REGIMEN - Breast   Paclitaxel Weekly + Trastuzumab Weekly:   Administer weekly:     Paclitaxel      Trastuzumab-xxxx      Trastuzumab-xxxx   **Always confirm dose/schedule in your pharmacy ordering system**  Trastuzumab (Maintenance - NO Loading Dose):   A cycle is every 21 days:     Trastuzumab-xxxx   **Always confirm dose/schedule in your pharmacy ordering system**  Patient Characteristics: Postoperative without Neoadjuvant Therapy (Pathologic Staging), Invasive Disease, Adjuvant Therapy, HER2 Positive, ER Positive, Node Negative, pT1c, pN0/N44m Therapeutic Status: Postoperative without Neoadjuvant Therapy (Pathologic Staging) AJCC Grade: G2 AJCC N Category: pN0 AJCC M Category: cM0 ER Status: Positive (+) AJCC 8 Stage Grouping: IA HER2 Status: Positive (+) Oncotype Dx Recurrence Score: Not Appropriate AJCC T Category: pT1c PR Status: Negative (-) Intent of Therapy: Curative Intent, Discussed with Patient

## 2019-04-22 NOTE — Assessment & Plan Note (Signed)
Routine screening mammogram detected a 1.3cm mass in the right breast, 9 o'clock position, no axillary adenopathy. 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  T1c N0 stage Ia  Recommendations: 1. 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 Adj chemo with Taxol-Herceptin foll by Herceptin maintenance for 1 year 3. Adjuvant radiation therapy followed by 4. Adjuvant antiestrogen therapy  I talked to her about UPBEAT and SWOG 1714 trials. Will give her more info after surgery. ------------------------------------------------------------------------------------------------------------------------- Chemo counseling: I discussed the chemotherapy regimen in more detail. Plan to start chemotherapy in 3 weeks.

## 2019-04-22 NOTE — Research (Signed)
04/22/19 at 11:43am- S1714 study notes- Dr. Lindi Adie referred this pt to the [id number] study.  The research nurse met with the pt for 20 minutes going over the study details.  The research nurse informed the pt that her participation is completely voluntary.  The pt was made aware of all of the study requirements and time points.  The pt agreed to read over the consent form and hipaa form.  The research nurse and the pt agreed to discuss the study and her participation next Wednesday, 11/4.  The pt was thanked for her interest in the study.  Brion Aliment RN, BSN, CCRP Clinical Research Nurse 04/22/2019 11:55 AM

## 2019-04-23 ENCOUNTER — Telehealth: Payer: Self-pay | Admitting: Hematology and Oncology

## 2019-04-23 ENCOUNTER — Encounter: Payer: Self-pay | Admitting: *Deleted

## 2019-04-23 NOTE — Telephone Encounter (Signed)
I left a message regarding schedule  

## 2019-04-28 ENCOUNTER — Encounter: Payer: Self-pay | Admitting: *Deleted

## 2019-04-28 NOTE — Progress Notes (Signed)
04/28/19 at 4:49pm -  UpBeat study notes- The research nurse called the pt to see if she had any questions about the UpBeat [phone].  The pt said that she and her daughter read the consent form,  and she was interested in participation.  The research nurse then read through the eligibility criteria with the pt.  The pt said that she could "not walk 2 blocks with shortness of breath and dizziness".   The pt also informed the nurse that she had a cardiac event during her surgery.  The pt's discharge instructions revealed that the pt developed "significant heart block intra-operatively" on 04/15/19.  Therefore, the pt was informed that she does not meet with study eligibility for enrollment due to her heart block within the past month.  The pt said that she was "afraid that this might disqualify her from participating on the study".  The pt was thanked for her interest in the trial.  The pt however is interested in participating on the SWOG neuropathy study.   Brion Aliment RN, BSN, CCRP Clinical Research Nurse 04/28/2019 4:55 PM

## 2019-04-28 NOTE — Progress Notes (Signed)
04/28/19 at 4:58pm- The research nurse called the pt about the study.  The pt said that she wanted to participate on the "neuropathy study".  The nurse answered all of her questions.  The pt agreed to come to the cancer center early on 11/18 to sign the consent form before her day 1 assessments/procedures. The pt was thanked for her interest and support of this trial. Brion Aliment RN, BSN, CCRP  Clinical Research Nurse 04/28/2019 5:00 PM

## 2019-04-30 NOTE — Progress Notes (Signed)
Primary cardiologist - , previous Margaret Norman patient   Problem List  1. Chest pain  2. LBBB 3. Diabetes Mellitus  4. Essential Hypertension 5. Hyperlipidemia  Previous notes.    Margaret Norman is seen today for initial visit .  Previous patient of Dr. Ron Norman. Seen with husband , Konrad Dolores  Has been having some ankle and leg swelling . BP had been higher.   Had been having lots of anxiety . Had stopped taking the Invokana  Was started on HCTZ 12.5 a day BP is better,  Has had some dizziness with that. Leg edema has resolved.   Has + family hx of heart disease Brother has HTN and LVH Father had an aortic issue.   Has been fatigued.  Has occasional PVC No CP .   Has some discomfort across her back  Does not get any regular exercise,   Gets short of breath with household chores.   12/22/2015: Margaret Norman is seen back today for follow-up visit. She was having some chest discomfort when we initially saw her several months ago.  She was on depleted at her initial visit. We stopped her HCTZ at that time. Stress Myoview study revealed no evidence of ischemia. Ejection fraction was 48% by nuclear study. By echocardiography, her EF was 50-55%. She does have grade 1 diastolic dysfunction.  Still having some issues with chest pain .  Worse with mental stress.   Aug. 16, 2018:    [name] is seen today  BP has been well control She has heard about the valsartan recall.   Aug. 30 , 2019   No CP or dyspnea.   Still exercising regularly .    Has normal LV function by echo in 2017 In  reviewing her labs, her LDL is 99.  She is diabetic.  Her LDL probably should be around 70.  Sept. 28, 2020   Margaret Norman is seen today for follow up of her HTN, hyperlipidemia.  She also has diabetes. Was just found to have R breast cancer .   Was picked up on screening mammogram  Going to surgeon tomorrow.    No CP ,  Walks regularly  Diabetes is fairly well controlled.  Has LBBB , normal LV function    May 01, 2019:  Margaret Norman is seen today following her lumpectomy.  Transient heart block during her surgical procedure/Port-A-Cath insertion.  She has an underlying left bundle branch block and its possible that she had some pressure on her right bundle causing temporary heart block.  She required some pressors immediately after the procedure but by the next day she was stable and was ready for discharge.  She has normal left ventricular systolic function by echo.   Allergies  Allergen Reactions  . Invokana [Canagliflozin] Other (See Comments)    UTI's  . Amoxicillin     Sore tongue Did it involve swelling of the face/tongue/throat, SOB, or low BP? No Did it involve sudden or severe rash/hives, skin peeling, or any reaction on the inside of your mouth or nose? No Did you need to seek medical attention at a hospital or doctor's office? No When did it last happen?5-10 years ago If all above answers are "NO", may proceed with cephalosporin use.     Current Outpatient Medications  Medication Sig Dispense Refill  . acetaminophen (TYLENOL) 500 MG tablet Take 1,000 mg by mouth every 6 (six) hours as needed for moderate pain.    Marland Kitchen aspirin 81 MG EC tablet Take 81  mg by mouth daily.      . Blood Glucose Monitoring Suppl (ONETOUCH VERIO FLEX SYSTEM) W/DEVICE KIT 1 kit by Does not apply route 3 (three) times daily. 1 kit 1  . buPROPion (WELLBUTRIN XL) 150 MG 24 hr tablet Take 1 tablet (150 mg total) by mouth every morning. 90 tablet 3  . Cyanocobalamin (VITAMIN B12 PO) Take 2,500 mcg by mouth daily.    . empagliflozin (JARDIANCE) 25 MG TABS tablet Take 25 mg by mouth daily. 30 tablet 11  . fluticasone (FLONASE) 50 MCG/ACT nasal spray Place 2 sprays into both nostrils daily. (Patient not taking: Reported on 04/07/2019) 16 g 6  . glucose blood (ONETOUCH VERIO) test strip Test three times daily as directed. DX: E11.9 300 each 3  . Insulin Pen Needle (B-D ULTRAFINE III SHORT PEN) 31G X 8 MM MISC  USE DAILY WITH LANTUS  INSULIN 90 each 1  . LANTUS SOLOSTAR 100 UNIT/ML Solostar Pen INJECT SUBCUTANEOUSLY 30  UNITS AT BEDTIME (Patient taking differently: Inject 30 Units into the skin at bedtime. ) 30 mL 3  . meloxicam (MOBIC) 15 MG tablet Take 15 mg by mouth daily.     . metFORMIN (GLUCOPHAGE) 1000 MG tablet TAKE 1 TABLET BY MOUTH  TWICE DAILY WITH MEALS (Patient taking differently: Take 1,000 mg by mouth 2 (two) times daily with a meal. ) 180 tablet 3  . olmesartan (BENICAR) 40 MG tablet Take 1 tablet (40 mg total) by mouth daily. 90 tablet 3  . omeprazole (PRILOSEC) 40 MG capsule TAKE 1 CAPSULE BY MOUTH  DAILY 90 capsule 3  . ONE TOUCH LANCETS MISC Test three times daily as directed. DX: E11.9 300 each 3  . Probiotic Product (PROBIOTIC ADVANCED PO) Take 1 tablet by mouth at bedtime.     . pyridOXINE (VITAMIN B-6) 100 MG tablet Take 100 mg by mouth daily.     . rosuvastatin (CRESTOR) 10 MG tablet Take 1 tablet (10 mg total) by mouth daily. 90 tablet 3  . sertraline (ZOLOFT) 100 MG tablet Take 1 tablet (100 mg total) by mouth daily. 90 tablet 3  . TRULICITY 1.5 HM/0.9OB SOPN INJECT THE CONTENTS OF 1  PEN SUBCUTANEOUSLY WEEKLY  AS DIRECTED (Patient taking differently: Take 3 mg by mouth every 7 (seven) days. ) 6 mL 2  . Vitamin E 180 MG CAPS Take 180 mg by mouth daily.     No current facility-administered medications for this visit.     Social History   Socioeconomic History  . Marital status: Married    Spouse name: Desani Sprung  . Number of children: 2  . Years of education: Not on file  . Highest education level: Not on file  Occupational History  . Occupation: CSR    Employer: [employer]  . Occupation: retired  Scientific laboratory technician  . Financial resource strain: Not very hard  . Food insecurity    Worry: Never true    Inability: Never true  . Transportation needs    Medical: No    Non-medical: No  Tobacco Use  . Smoking status: Never Smoker  . Smokeless tobacco: Never  Used  Substance and Sexual Activity  . Alcohol use: No  . Drug use: No  . Sexual activity: Not Currently  Lifestyle  . Physical activity    Days per week: 0 days    Minutes per session: 0 min  . Stress: Not at all  Relationships  . Social Herbalist on  phone: More than three times a week    Gets together: More than three times a week    Attends religious service: Not on file    Active member of club or organization: Not on file    Attends meetings of clubs or organizations: Not on file    Relationship status: Married  . Intimate partner violence    Fear of current or ex partner: No    Emotionally abused: No    Physically abused: No    Forced sexual activity: No  Other Topics Concern  . Not on file  Social History Narrative   Regular Exercise -  NO    Family History  Problem Relation Age of Onset  . Liver disease Mother   . Dementia Mother   . Diabetes Mother        borderline  . Coronary artery disease Father   . Heart attack Father   . Hypertension Father   . Heart disease Father   . Cancer Other        leukemia  . Stroke Maternal Grandfather   . Hyperlipidemia Brother        Amyloidosis  . Diabetes Maternal Grandmother   . Cancer Paternal Uncle        unknown  . Heart attack Paternal Grandmother   . Heart attack Paternal Uncle   . Hypertension Brother   . Diabetes Daughter        borderline  . Colon cancer Neg Hx     Past Medical History:  Diagnosis Date  . Arthritis   . Chest pain    Nuclear, adenosine,  December, 2013, low risk nuclear scan with small, moderate in intensity, fixed anteroseptal defect. This is possibly related to an LBBB versus small prior infarct. No ischemia  . Depression   . Gallstones 11-06  . GERD (gastroesophageal reflux disease)   . History of kidney stones   . HTN (hypertension)   . Hyperlipidemia   . Kidney mass 01/28/2014  . LBBB (left bundle branch block)   . Nephrolithiasis   . Pneumonia   . 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 Type II or unspecified type diabetes mellitus without mention of complication, not stated as uncontrolled     Past Surgical History:  Procedure Laterality Date  . APPENDECTOMY    . BREAST LUMPECTOMY WITH RADIOACTIVE SEED AND SENTINEL LYMPH NODE BIOPSY Right 04/15/2019   Procedure: RIGHT BREAST PARTIAL MASTECTOMY WITH RADIOACTIVE SEED AND SENTINEL LYMPH NODE BIOPSY;  Surgeon: Coralie Keens, MD;  Location: [facility];  Service: General;  Laterality: Right;  . CESAREAN SECTION     x2 ? w/appy  . CHOLECYSTECTOMY    . ESOPHAGOGASTRODUODENOSCOPY    . HERNIA REPAIR    . KNEE ARTHROSCOPY Right 11/2005  . LIVER BIOPSY    . PANCREATIC CYST EXCISION    . PORTACATH PLACEMENT Left 04/15/2019   Procedure: INSERTION PORT-A-CATH WITH ULTRASOUND;  Surgeon: Coralie Keens, MD;  Location: [facility];  Service: General;  Laterality: Left;  . SPLENECTOMY    . TONSILLECTOMY AND ADENOIDECTOMY      Patient Active Problem List   Diagnosis Date Noted  . LBBB (left bundle branch block)     Priority: High  . Breast cancer ([place]) 04/15/2019  . Second degree AV block   . Malignant neoplasm of upper-outer quadrant of right breast in female, estrogen receptor positive ([place]) 03/25/2019  . Thyromegaly 01/10/2018  . Overweight (BMI 25.0-29.9) 08/13/2017  . Upper respiratory tract infection  07/30/2017  . Type 2 diabetes mellitus with hyperglycemia, with long-term current use of insulin ([place]) 03/19/2017  . Thiamine deficiency 01/04/2017  . 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 Deficiency anemia 04/30/2016  . Routine general medical examination at a health care facility 04/27/2015  . Abdominal wall hernia 04/26/2015  . Family history of hemochromatosis 09/13/2014  . Acute maxillary sinusitis 05/14/2014  . Personal history of colonic polyps 03/10/2014  . 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 Lumbosacral spondylosis without myelopathy 05/12/2013  . Insomnia,  persistent 02/04/2013  . Obesity (BMI 30-39.9) 02/04/2013  . Unspecified asthma, with exacerbation 09/22/2012  . Visit for screening mammogram 07/04/2012  . Nephrolithiasis   . HTN (hypertension)   . Gallstones   . Leukocytosis 12/20/2008  . Hyperlipidemia with target LDL less than 100 09/04/2007  . Chronic depression 09/04/2007  . GERD 09/04/2007    ROS  Noted in current hx    Physical Exam: There were no vitals taken for this visit.  GEN:  Well nourished, well developed in no acute distress HEENT: Normal NECK: No JVD; No carotid bruits LYMPHATICS: No lymphadenopathy CARDIAC: RRR  RESPIRATORY:  Clear to auscultation without rales, wheezing or rhonchi  ABDOMEN: Soft, non-tender, non-distended MUSCULOSKELETAL:  No edema; No deformity  SKIN: Warm and dry NEUROLOGIC:  Alert and oriented x 3   ECG :    Nov. 6 2020 .   LBBB .  Normal conduction   Usually occurs ASSESSMENT & PLAN Is asked that what episode of passing out means okay to probably need a pacemaker but but an episode of his cold syncope to have an episode or not a candidate for pacemaker you do not need 1 but if you do have an episode with that history of what we saw with your history of left bundle branch block and an episode of syncope I think I would I would talk to my electrophysiology colleagues I think he went up with a pacemaker of the near manageability just a second 1.8 is intermittent pain.  Initial alk phos Margaret Norman is here is noticing some left ear pain is a blessing and occurs 1. Chest pain  -no further ep  2. LBBB -stable.  Had transient complete heart block while she was having her Port-A-Cath put in.  Is still full "to know whether the advanced heart block was due to anesthesia, the presence of a guidewire in her right ventricle or combination of both.  The heart block resolved completely following the procedure and she has normal AV conduction now.  She does still have her left bundle branch block which is  unchanged.  At this point we will continue to watch.  I suspect that this was procedurally related.  She is not had any episodes of syncope.  We will continue to watch her closely.  3. Diabetes Mellitus - managed by primary MD   4. Essential Hypertension -blood pressures well controlled.  5. Hyperlipidemia -  .  6.  Right breast cancer:  Plans per oncology   .   Mertie Moores, MD  04/30/2019 10:04 AM    [address],  [address] [place], [place]  [phone] Pager [phone] Phone: [phone]; Fax: [phone]

## 2019-05-01 ENCOUNTER — Encounter: Payer: Self-pay | Admitting: Cardiovascular Disease

## 2019-05-01 ENCOUNTER — Ambulatory Visit (INDEPENDENT_AMBULATORY_CARE_PROVIDER_SITE_OTHER): Payer: Medicare Other | Admitting: Cardiovascular Disease

## 2019-05-01 ENCOUNTER — Other Ambulatory Visit: Payer: Self-pay

## 2019-05-01 VITALS — BP 118/84 | HR 90 | Ht 62.0 in | Wt 151.8 lb

## 2019-05-01 DIAGNOSIS — E782 Mixed hyperlipidemia: Secondary | ICD-10-CM

## 2019-05-01 DIAGNOSIS — I447 Left bundle-branch block, unspecified: Secondary | ICD-10-CM

## 2019-05-01 DIAGNOSIS — I10 Essential (primary) hypertension: Secondary | ICD-10-CM

## 2019-05-01 NOTE — Patient Instructions (Signed)
Medication Instructions:  Your physician recommends that you continue on your current medications as directed. Please refer to the Current Medication list given to you today.  *If you need a refill on your cardiac medications before your next appointment, please call your pharmacy*   Lab Work: None Ordered   Testing/Procedures: None Ordered   Follow-Up: At [facility], you and your health needs are our priority.  As part of our continuing mission to provide you with exceptional heart care, we have created designated Provider Care Teams.  These Care Teams include your primary Cardiologist (physician) and Advanced Practice Providers (APPs -  Physician Assistants and Nurse Practitioners) who all work together to provide you with the care you need, when you need it.  Your next appointment:   12 months  The format for your next appointment:   In Person  Provider:   You may see Philip [name], MD or one of the following Advanced Practice Providers on your designated Care Team:    Scott Weaver, PA-C  Vin Bhagat, PA-C  Janine Hammond, NP    

## 2019-05-04 ENCOUNTER — Other Ambulatory Visit: Payer: Self-pay | Admitting: Cardiovascular Disease

## 2019-05-07 ENCOUNTER — Other Ambulatory Visit: Payer: Self-pay | Admitting: *Deleted

## 2019-05-07 DIAGNOSIS — Z170 Estrogen receptor positive status [ER+]: Secondary | ICD-10-CM

## 2019-05-07 DIAGNOSIS — C50411 Malignant neoplasm of upper-outer quadrant of right female breast: Secondary | ICD-10-CM

## 2019-05-12 ENCOUNTER — Other Ambulatory Visit: Payer: Self-pay | Admitting: *Deleted

## 2019-05-12 DIAGNOSIS — Z170 Estrogen receptor positive status [ER+]: Secondary | ICD-10-CM

## 2019-05-12 DIAGNOSIS — C50411 Malignant neoplasm of upper-outer quadrant of right female breast: Secondary | ICD-10-CM

## 2019-05-13 ENCOUNTER — Encounter: Payer: Self-pay | Admitting: *Deleted

## 2019-05-13 ENCOUNTER — Inpatient Hospital Stay: Payer: Medicare Other

## 2019-05-13 ENCOUNTER — Inpatient Hospital Stay (HOSPITAL_BASED_OUTPATIENT_CLINIC_OR_DEPARTMENT_OTHER): Payer: Medicare Other | Admitting: Hematology and Oncology

## 2019-05-13 ENCOUNTER — Ambulatory Visit: Payer: Medicare Other

## 2019-05-13 ENCOUNTER — Other Ambulatory Visit: Payer: Self-pay

## 2019-05-13 ENCOUNTER — Inpatient Hospital Stay: Payer: Medicare Other | Attending: Hematology and Oncology

## 2019-05-13 VITALS — BP 137/66 | HR 91 | Temp 98.3°F | Resp 18 | Ht 62.0 in | Wt 149.5 lb

## 2019-05-13 VITALS — BP 128/62 | HR 79 | Temp 98.9°F | Resp 17

## 2019-05-13 DIAGNOSIS — E785 Hyperlipidemia, unspecified: Secondary | ICD-10-CM | POA: Insufficient documentation

## 2019-05-13 DIAGNOSIS — Z170 Estrogen receptor positive status [ER+]: Secondary | ICD-10-CM | POA: Insufficient documentation

## 2019-05-13 DIAGNOSIS — Z5111 Encounter for antineoplastic chemotherapy: Secondary | ICD-10-CM | POA: Insufficient documentation

## 2019-05-13 DIAGNOSIS — E1165 Type 2 diabetes mellitus with hyperglycemia: Secondary | ICD-10-CM | POA: Insufficient documentation

## 2019-05-13 DIAGNOSIS — Z79899 Other long term (current) drug therapy: Secondary | ICD-10-CM | POA: Diagnosis not present

## 2019-05-13 DIAGNOSIS — C50411 Malignant neoplasm of upper-outer quadrant of right female breast: Secondary | ICD-10-CM

## 2019-05-13 DIAGNOSIS — I10 Essential (primary) hypertension: Secondary | ICD-10-CM | POA: Diagnosis not present

## 2019-05-13 DIAGNOSIS — Z006 Encounter for examination for normal comparison and control in clinical research program: Secondary | ICD-10-CM | POA: Diagnosis not present

## 2019-05-13 DIAGNOSIS — Z5112 Encounter for antineoplastic immunotherapy: Secondary | ICD-10-CM | POA: Diagnosis not present

## 2019-05-13 DIAGNOSIS — C50811 Malignant neoplasm of overlapping sites of right female breast: Secondary | ICD-10-CM | POA: Diagnosis present

## 2019-05-13 DIAGNOSIS — Z794 Long term (current) use of insulin: Secondary | ICD-10-CM | POA: Insufficient documentation

## 2019-05-13 DIAGNOSIS — Z95828 Presence of other vascular implants and grafts: Secondary | ICD-10-CM

## 2019-05-13 LAB — CMP (CANCER CENTER ONLY)
ALT: 14 U/L (ref 0–44)
AST: 13 U/L — ABNORMAL LOW (ref 15–41)
Albumin: 4.2 g/dL (ref 3.5–5.0)
Alkaline Phosphatase: 59 U/L (ref 38–126)
Anion gap: 14 (ref 5–15)
BUN: 22 mg/dL (ref 8–23)
CO2: 21 mmol/L — ABNORMAL LOW (ref 22–32)
Calcium: 9.6 mg/dL (ref 8.9–10.3)
Chloride: 106 mmol/L (ref 98–111)
Creatinine: 0.86 mg/dL (ref 0.44–1.00)
GFR, Est AFR Am: >60 mL/min (ref >60)
GFR, Estimated: >60 mL/min (ref >60)
Glucose, Bld: 115 mg/dL — ABNORMAL HIGH (ref 70–99)
Potassium: 4.1 mmol/L (ref 3.5–5.1)
Sodium: 141 mmol/L (ref 135–145)
Total Bilirubin: 0.4 mg/dL (ref 0.3–1.2)
Total Protein: 6.9 g/dL (ref 6.5–8.1)

## 2019-05-13 LAB — CBC WITH DIFFERENTIAL (CANCER CENTER ONLY)
Abs Immature Granulocytes: 0.03 10*3/uL (ref 0.00–0.07)
Basophils Absolute: 0.1 10*3/uL (ref 0.0–0.1)
Basophils Relative: 1 %
Eosinophils Absolute: 0.7 10*3/uL — ABNORMAL HIGH (ref 0.0–0.5)
Eosinophils Relative: 5 %
HCT: 37.2 % (ref 36.0–46.0)
Hemoglobin: 11.8 g/dL — ABNORMAL LOW (ref 12.0–15.0)
Immature Granulocytes: 0 %
Lymphocytes Relative: 33 %
Lymphs Abs: 4.6 10*3/uL — ABNORMAL HIGH (ref 0.7–4.0)
MCH: 29 pg (ref 26.0–34.0)
MCHC: 31.7 g/dL (ref 30.0–36.0)
MCV: 91.4 fL (ref 80.0–100.0)
Monocytes Absolute: 1.4 10*3/uL — ABNORMAL HIGH (ref 0.1–1.0)
Monocytes Relative: 10 %
Neutro Abs: 7.3 10*3/uL (ref 1.7–7.7)
Neutrophils Relative %: 51 %
Platelet Count: 453 10*3/uL — ABNORMAL HIGH (ref 150–400)
RBC: 4.07 MIL/uL (ref 3.87–5.11)
RDW: 13.3 % (ref 11.5–15.5)
WBC Count: 14.1 10*3/uL — ABNORMAL HIGH (ref 4.0–10.5)
nRBC: 0 % (ref 0.0–0.2)

## 2019-05-13 LAB — RESEARCH LABS

## 2019-05-13 MED ORDER — SODIUM CHLORIDE 0.9% FLUSH
10.0000 mL | INTRAVENOUS | Status: DC | PRN
  Administered 2019-05-13: 10 mL via INTRAVENOUS
  Filled 2019-05-13: qty 10

## 2019-05-13 MED ORDER — ALTEPLASE 2 MG IJ SOLR
INTRAMUSCULAR | Status: AC
  Filled 2019-05-13: qty 2

## 2019-05-13 MED ORDER — ACETAMINOPHEN 325 MG PO TABS
650.0000 mg | ORAL_TABLET | Freq: Once | ORAL | Status: AC
  Administered 2019-05-13: 650 mg via ORAL

## 2019-05-13 MED ORDER — DIPHENHYDRAMINE HCL 50 MG/ML IJ SOLN
25.0000 mg | Freq: Once | INTRAMUSCULAR | Status: AC
  Administered 2019-05-13: 25 mg via INTRAVENOUS

## 2019-05-13 MED ORDER — ONDANSETRON HCL 8 MG PO TABS: 8.0000 mg | ORAL_TABLET | Freq: Two times a day (BID) | ORAL | 1 refills | Status: DC | PRN

## 2019-05-13 MED ORDER — DEXAMETHASONE SODIUM PHOSPHATE 10 MG/ML IJ SOLN
10.0000 mg | Freq: Once | INTRAMUSCULAR | Status: AC
  Administered 2019-05-13: 10 mg via INTRAVENOUS

## 2019-05-13 MED ORDER — SODIUM CHLORIDE 0.9% FLUSH
10.0000 mL | INTRAVENOUS | Status: DC | PRN
  Administered 2019-05-13: 10 mL
  Filled 2019-05-13: qty 10

## 2019-05-13 MED ORDER — TRASTUZUMAB-ANNS CHEMO 150 MG IV SOLR
4.0000 mg/kg | Freq: Once | INTRAVENOUS | Status: AC
  Administered 2019-05-13: 273 mg via INTRAVENOUS
  Filled 2019-05-13: qty 13

## 2019-05-13 MED ORDER — SODIUM CHLORIDE 0.9 % IV SOLN
Freq: Once | INTRAVENOUS | Status: AC
  Administered 2019-05-13: 10:00:00 via INTRAVENOUS
  Filled 2019-05-13: qty 250

## 2019-05-13 MED ORDER — DEXAMETHASONE SODIUM PHOSPHATE 10 MG/ML IJ SOLN
INTRAMUSCULAR | Status: AC
  Filled 2019-05-13: qty 1

## 2019-05-13 MED ORDER — ACETAMINOPHEN 325 MG PO TABS
ORAL_TABLET | ORAL | Status: AC
  Filled 2019-05-13: qty 2

## 2019-05-13 MED ORDER — SODIUM CHLORIDE 0.9 % IV SOLN: 10.0000 mg | Freq: Once | INTRAVENOUS | Status: DC

## 2019-05-13 MED ORDER — LIDOCAINE-PRILOCAINE 2.5-2.5 % EX CREA: TOPICAL_CREAM | CUTANEOUS | 3 refills | Status: DC

## 2019-05-13 MED ORDER — SODIUM CHLORIDE 0.9 % IV SOLN
80.0000 mg/m2 | Freq: Once | INTRAVENOUS | Status: AC
  Administered 2019-05-13: 138 mg via INTRAVENOUS
  Filled 2019-05-13: qty 23

## 2019-05-13 MED ORDER — DIPHENHYDRAMINE HCL 50 MG/ML IJ SOLN
INTRAMUSCULAR | Status: AC
  Filled 2019-05-13: qty 1

## 2019-05-13 MED ORDER — FAMOTIDINE IN NACL 20-0.9 MG/50ML-% IV SOLN
INTRAVENOUS | Status: AC
  Filled 2019-05-13: qty 50

## 2019-05-13 MED ORDER — PROCHLORPERAZINE MALEATE 10 MG PO TABS: 10.0000 mg | ORAL_TABLET | Freq: Four times a day (QID) | ORAL | 1 refills | Status: DC | PRN

## 2019-05-13 MED ORDER — HEPARIN SOD (PORK) LOCK FLUSH 100 UNIT/ML IV SOLN
500.0000 [IU] | Freq: Once | INTRAVENOUS | Status: AC | PRN
  Administered 2019-05-13: 500 [IU]
  Filled 2019-05-13: qty 5

## 2019-05-13 MED ORDER — ALTEPLASE 2 MG IJ SOLR
2.0000 mg | Freq: Once | INTRAMUSCULAR | Status: DC | PRN
  Filled 2019-05-13: qty 2

## 2019-05-13 MED ORDER — FAMOTIDINE IN NACL 20-0.9 MG/50ML-% IV SOLN
20.0000 mg | Freq: Once | INTRAVENOUS | Status: AC
  Administered 2019-05-13: 20 mg via INTRAVENOUS

## 2019-05-13 NOTE — Patient Instructions (Signed)
[facility] Discharge Instructions for Patients Receiving Chemotherapy  Today you received the following chemotherapy agents: Trastuzumab, Paclitaxel  To help prevent nausea and vomiting after your treatment, we encourage you to take your nausea medication as directed.   If you develop nausea and vomiting that is not controlled by your nausea medication, call the clinic.   BELOW ARE SYMPTOMS THAT SHOULD BE REPORTED IMMEDIATELY:  *FEVER GREATER THAN 100.5 F  *CHILLS WITH OR WITHOUT FEVER  NAUSEA AND VOMITING THAT IS NOT CONTROLLED WITH YOUR NAUSEA MEDICATION  *UNUSUAL SHORTNESS OF BREATH  *UNUSUAL BRUISING OR BLEEDING  TENDERNESS IN MOUTH AND THROAT WITH OR WITHOUT PRESENCE OF ULCERS  *URINARY PROBLEMS  *BOWEL PROBLEMS  UNUSUAL RASH Items with * indicate a potential emergency and should be followed up as soon as possible.  Feel free to call the clinic should you have any questions or concerns. The clinic phone number is [phone].  Please show the CHEMO ALERT CARD at check-in to the Emergency Department and triage nurse.  Trastuzumab injection for infusion What is this medicine? TRASTUZUMAB (tras TOO zoo mab) is a monoclonal antibody. It is used to treat breast cancer and stomach cancer. This medicine may be used for other purposes; ask your health care provider or pharmacist if you have questions. COMMON BRAND NAME(S): Herceptin, Herzuma, KANJINTI, Ogivri, Ontruzant, Trazimera What should I tell my health care provider before I take this medicine? They need to know if you have any of these conditions:  heart disease  heart failure  lung or breathing disease, like asthma  an unusual or allergic reaction to trastuzumab, benzyl alcohol, or other medications, foods, dyes, or preservatives  pregnant or trying to get pregnant  breast-feeding How should I use this medicine? This drug is given as an infusion into a vein. It is administered in a  hospital or clinic by a specially trained health care professional. Talk to your pediatrician regarding the use of this medicine in children. This medicine is not approved for use in children. Overdosage: If you think you have taken too much of this medicine contact a poison control center or emergency room at once. NOTE: This medicine is only for you. Do not share this medicine with others. What if I miss a dose? It is important not to miss a dose. Call your doctor or health care professional if you are unable to keep an appointment. What may interact with this medicine? This medicine may interact with the following medications:  certain types of chemotherapy, such as daunorubicin, doxorubicin, epirubicin, and idarubicin This list may not describe all possible interactions. Give your health care provider a list of all the medicines, herbs, non-prescription drugs, or dietary supplements you use. Also tell them if you smoke, drink alcohol, or use illegal drugs. Some items may interact with your medicine. What should I watch for while using this medicine? Visit your doctor for checks on your progress. Report any side effects. Continue your course of treatment even though you feel ill unless your doctor tells you to stop. Call your doctor or health care professional for advice if you get a fever, chills or sore throat, or other symptoms of a cold or flu. Do not treat yourself. Try to avoid being around people who are sick. You may experience fever, chills and shaking during your first infusion. These effects are usually mild and can be treated with other medicines. Report any side effects during the infusion to your health care professional. Fever and   chills usually do not happen with later infusions. Do not become pregnant while taking this medicine or for 7 months after stopping it. Women should inform their doctor if they wish to become pregnant or think they might be pregnant. Women of child-bearing  potential will need to have a negative pregnancy test before starting this medicine. There is a potential for serious side effects to an unborn child. Talk to your health care professional or pharmacist for more information. Do not breast-feed an infant while taking this medicine or for 7 months after stopping it. Women must use effective birth control with this medicine. What side effects may I notice from receiving this medicine? Side effects that you should report to your doctor or health care professional as soon as possible:  allergic reactions like skin rash, itching or hives, swelling of the face, lips, or tongue  chest pain or palpitations  cough  dizziness  feeling faint or lightheaded, falls  fever  general ill feeling or flu-like symptoms  signs of worsening heart failure like breathing problems; swelling in your legs and feet  unusually weak or tired Side effects that usually do not require medical attention (report to your doctor or health care professional if they continue or are bothersome):  bone pain  changes in taste  diarrhea  joint pain  nausea/vomiting  weight loss This list may not describe all possible side effects. Call your doctor for medical advice about side effects. You may report side effects to FDA at 1-800-FDA-1088. Where should I keep my medicine? This drug is given in a hospital or clinic and will not be stored at home. NOTE: This sheet is a summary. It may not cover all possible information. If you have questions about this medicine, talk to your doctor, pharmacist, or health care provider.  2020 Elsevier/Gold Standard (2016-06-05 14:37:52)  Paclitaxel injection What is this medicine? PACLITAXEL (PAK li TAX el) is a chemotherapy drug. It targets fast dividing cells, like cancer cells, and causes these cells to die. This medicine is used to treat ovarian cancer, breast cancer, lung cancer, Kaposi's sarcoma, and other cancers. This medicine  may be used for other purposes; ask your health care provider or pharmacist if you have questions. COMMON BRAND NAME(S): Onxol, Taxol What should I tell my health care provider before I take this medicine? They need to know if you have any of these conditions:  history of irregular heartbeat  liver disease  low blood counts, like low white cell, platelet, or red cell counts  lung or breathing disease, like asthma  tingling of the fingers or toes, or other nerve disorder  an unusual or allergic reaction to paclitaxel, alcohol, polyoxyethylated castor oil, other chemotherapy, other medicines, foods, dyes, or preservatives  pregnant or trying to get pregnant  breast-feeding How should I use this medicine? This drug is given as an infusion into a vein. It is administered in a hospital or clinic by a specially trained health care professional. Talk to your pediatrician regarding the use of this medicine in children. Special care may be needed. Overdosage: If you think you have taken too much of this medicine contact a poison control center or emergency room at once. NOTE: This medicine is only for you. Do not share this medicine with others. What if I miss a dose? It is important not to miss your dose. Call your doctor or health care professional if you are unable to keep an appointment. What may interact with this medicine? Do   not take this medicine with any of the following medications:  disulfiram  metronidazole This medicine may also interact with the following medications:  antiviral medicines for hepatitis, HIV or AIDS  certain antibiotics like erythromycin and clarithromycin  certain medicines for fungal infections like ketoconazole and itraconazole  certain medicines for seizures like carbamazepine, phenobarbital, phenytoin  gemfibrozil  nefazodone  rifampin  St. John's wort This list may not describe all possible interactions. Give your health care provider a  list of all the medicines, herbs, non-prescription drugs, or dietary supplements you use. Also tell them if you smoke, drink alcohol, or use illegal drugs. Some items may interact with your medicine. What should I watch for while using this medicine? Your condition will be monitored carefully while you are receiving this medicine. You will need important blood work done while you are taking this medicine. This medicine can cause serious allergic reactions. To reduce your risk you will need to take other medicine(s) before treatment with this medicine. If you experience allergic reactions like skin rash, itching or hives, swelling of the face, lips, or tongue, tell your doctor or health care professional right away. In some cases, you may be given additional medicines to help with side effects. Follow all directions for their use. This drug may make you feel generally unwell. This is not uncommon, as chemotherapy can affect healthy cells as well as cancer cells. Report any side effects. Continue your course of treatment even though you feel ill unless your doctor tells you to stop. Call your doctor or health care professional for advice if you get a fever, chills or sore throat, or other symptoms of a cold or flu. Do not treat yourself. This drug decreases your body's ability to fight infections. Try to avoid being around people who are sick. This medicine may increase your risk to bruise or bleed. Call your doctor or health care professional if you notice any unusual bleeding. Be careful brushing and flossing your teeth or using a toothpick because you may get an infection or bleed more easily. If you have any dental work done, tell your dentist you are receiving this medicine. Avoid taking products that contain aspirin, acetaminophen, ibuprofen, naproxen, or ketoprofen unless instructed by your doctor. These medicines may hide a fever. Do not become pregnant while taking this medicine. Women should inform  their doctor if they wish to become pregnant or think they might be pregnant. There is a potential for serious side effects to an unborn child. Talk to your health care professional or pharmacist for more information. Do not breast-feed an infant while taking this medicine. Men are advised not to father a child while receiving this medicine. This product may contain alcohol. Ask your pharmacist or healthcare provider if this medicine contains alcohol. Be sure to tell all healthcare providers you are taking this medicine. Certain medicines, like metronidazole and disulfiram, can cause an unpleasant reaction when taken with alcohol. The reaction includes flushing, headache, nausea, vomiting, sweating, and increased thirst. The reaction can last from 30 minutes to several hours. What side effects may I notice from receiving this medicine? Side effects that you should report to your doctor or health care professional as soon as possible:  allergic reactions like skin rash, itching or hives, swelling of the face, lips, or tongue  breathing problems  changes in vision  fast, irregular heartbeat  high or low blood pressure  mouth sores  pain, tingling, numbness in the hands or feet  signs of   decreased platelets or bleeding - bruising, pinpoint red spots on the skin, black, tarry stools, blood in the urine  signs of decreased red blood cells - unusually weak or tired, feeling faint or lightheaded, falls  signs of infection - fever or chills, cough, sore throat, pain or difficulty passing urine  signs and symptoms of liver injury like dark yellow or brown urine; general ill feeling or flu-like symptoms; light-colored stools; loss of appetite; nausea; right upper belly pain; unusually weak or tired; yellowing of the eyes or skin  swelling of the ankles, feet, hands  unusually slow heartbeat Side effects that usually do not require medical attention (report to your doctor or health care  professional if they continue or are bothersome):  diarrhea  hair loss  loss of appetite  muscle or joint pain  nausea, vomiting  pain, redness, or irritation at site where injected  tiredness This list may not describe all possible side effects. Call your doctor for medical advice about side effects. You may report side effects to FDA at 1-800-FDA-1088. Where should I keep my medicine? This drug is given in a hospital or clinic and will not be stored at home. NOTE: This sheet is a summary. It may not cover all possible information. If you have questions about this medicine, talk to your doctor, pharmacist, or health care provider.  2020 Elsevier/Gold Standard (2017-02-12 13:14:55)  

## 2019-05-13 NOTE — Research (Signed)
05/13/2019 at 10:22am - [id number] consent visit/registration/baseline activities-  The research nurse met with the pt upon her arrival to the [facility] this morning.  The pt stated that she had read the consent form and hipaa form, and she wanted to participate in the "neuropathy" study.  The pt had no questions or concerns about study participation.  Therefore, the pt signed the consent form and hipaa form at 8:10am this morning before any study procedures were performed.  The pt was given a copy of her signed forms for her records.  The pt was given her baseline questionnaires to complete.  The pt completed her questionnaires, and the research nurse reviewed them for accuracy and completion.  Cameo Windham, Nutritional therapist, confirmed that the pt met all of the eligibility criteria (section 5.0) for study enrollment.  Dr. Lindi Adie also reviewed the pt's eligibility and reviewed/signed the pt's [id number] Registration Worksheet confirming the pt meets all criteria in section 5.0 of the most current protocol. The pt was successfully registered to the study, and was given the PID of [id number].  The research nurse met with the pt along with Foye Spurling, RN to conduct the pt's baseline neuropathy testing (neuropen and tuning fork assessments). The pt completed the "Timed Get Up and Go" test. The pt also denied any history of falls within the past 6 months. The pt confirmed that she is right-handed and that she would kick a ball with her right foot.  Dr. Lindi Adie met with the pt for her H/P exam. Dr. Lindi Adie completed the pt's baseline neuropathy forms for the study. The pt confirmed that she has never smoked. She denied all of the health conditions mentioned on the Onstudy Form except for diabetes. The pt denied currently taking the following medications: gabapentin, narcotics, tramadol, duloxetine, and ibuprofen. The pt said that she currently takes Tylenol 500 mg 2-3 pills per week. The pt also reports using anti-depressants (  Zoloft 113m daily and Wellbutrin 150 daily).  She reports using Vitamin E and Vitamin B6 (denies glutamine and fish oil).  The pt was asked about her baseline assessment for assessment of interventions of CIPN. The pt denied any current interventions for neuropathy. The pt's pre-treatment serum creatinine level was WNL. The pt's baseline research blood was drawn this morning. The pt's last 10 minutes of taxane blood draw has been scheduled per protocol requirements. NBrion AlimentRN, BSN, CCRP Clinical Research Nurse 05/13/2019 10:35 AM

## 2019-05-13 NOTE — Assessment & Plan Note (Signed)
Routine screening mammogram detected a 1.3cm mass in the right breast, 9 o'clock position, no axillary adenopathy. 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  T1c N0 stage Ia  Recommendations: 1. 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 Adj chemo with Taxol-Herceptin foll by Herceptin maintenance for 1 year 3. Adjuvant radiation therapy followed by 4. Adjuvant antiestrogen therapy  I talked to her about UPBEAT and SWOG 1714 trials. Will give her more info after surgery. ------------------------------------------------------------------------------------------------------------------------- Echo

## 2019-05-13 NOTE — Progress Notes (Signed)
[facility] up:    Margaret Norman, [place] [address]   DIAGNOSIS: Cancer Staging Malignant neoplasm of upper-outer quadrant of right breast in female, estrogen receptor positive ([place]) Staging form: Breast, AJCC 8th Edition - Clinical stage from 03/16/2019: Stage IA (cT1c, cN0, cM0, G2, ER+, PR+, HER2+) - Signed by [name], NP on 03/25/2019 - Pathologic stage from 04/15/2019: Stage IA (pT1c, pN0, cM0, G2, ER+, PR+, HER2+) - Signed by [name], NP on 04/29/2019   SUMMARY OF ONCOLOGIC HISTORY: Oncology History  Malignant neoplasm of upper-outer quadrant of right breast in female, estrogen receptor positive ([place])  03/16/2019 Cancer Staging   Staging form: Breast, AJCC 8th Edition - Clinical stage from 03/16/2019: Stage IA (cT1c, cN0, cM0, G2, ER+, PR+, HER2+) - Signed by [name], NP on 03/25/2019   03/25/2019 Initial Diagnosis   Routine screening mammogram detected a 1.3cm mass in the right breast, 9 o'clock position, no axillary adenopathy. 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    04/15/2019 Surgery   Right lumpectomy [name]): IDC, grade 2, 1.3cm, with intermediate grade DCIS, clear margins, and 1 right axillary lymph node negative   04/15/2019 Cancer Staging   Staging form: Breast, AJCC 8th Edition - Pathologic stage from 04/15/2019: Stage IA (pT1c, pN0, cM0, G2, ER+, PR+, HER2+) - Signed by [name], NP on 04/29/2019   05/13/2019 -  Chemotherapy   Weekly Taxol and Herceptin x 12, then maintenance Herceptin every 3 weeks x 1 year     CURRENT THERAPY: Cycle 1 Taxol Herceptin  INTERVAL HISTORY: Margaret Norman 68 y.o. female with above-mentioned history of right breast cancer treated with lumpectomy and is here to start her first cycle of chemotherapy with Taxol Herceptin.  She is slightly anxious about  starting the chemotherapy but otherwise feeling quite well.  She is healed very well from recent surgery.  She has been enrolled in the clinical trial for neuropathy which is SWOG 1714.   Patient Active Problem List   Diagnosis Date Noted  . Breast cancer ([place]) 04/15/2019  . Second degree AV block   . Malignant neoplasm of upper-outer quadrant of right breast in female, estrogen receptor positive ([place]) 03/25/2019  . Thyromegaly 01/10/2018  . Overweight (BMI 25.0-29.9) 08/13/2017  . Upper respiratory tract infection 07/30/2017  . Type 2 diabetes mellitus with hyperglycemia, with long-term current use of insulin ([place]) 03/19/2017  . Thiamine deficiency 01/04/2017  . 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 Deficiency anemia 04/30/2016  . Routine general medical examination at a health care facility 04/27/2015  . Abdominal wall hernia 04/26/2015  . Family history of hemochromatosis 09/13/2014  . Acute maxillary sinusitis 05/14/2014  . Personal history of colonic polyps 03/10/2014  . 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 Lumbosacral spondylosis without myelopathy 05/12/2013  . Insomnia, persistent 02/04/2013  . Obesity (BMI 30-39.9) 02/04/2013  . Unspecified asthma, with exacerbation 09/22/2012  . Visit for screening mammogram 07/04/2012  . Nephrolithiasis   . HTN (hypertension)   . Gallstones   . LBBB (left bundle branch block)   . Leukocytosis 12/20/2008  . Hyperlipidemia with target LDL less than 100 09/04/2007  . Chronic depression 09/04/2007  . GERD 09/04/2007    is allergic to invokana [canagliflozin] and amoxicillin.  MEDICAL HISTORY: Past Medical History:  Diagnosis Date  . Arthritis   . Chest pain    Nuclear,  adenosine,  December, 2013, low risk nuclear scan with small, moderate in intensity, fixed anteroseptal defect. This is possibly related to an LBBB versus small prior infarct. No ischemia  . Depression   . Gallstones 11-06  . GERD  (gastroesophageal reflux disease)   . History of kidney stones   . HTN (hypertension)   . Hyperlipidemia   . Kidney mass 01/28/2014  . LBBB (left bundle branch block)   . Nephrolithiasis   . Pneumonia   . 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 Type II or unspecified type diabetes mellitus without mention of complication, not stated as uncontrolled     SURGICAL HISTORY: Past Surgical History:  Procedure Laterality Date  . APPENDECTOMY    . BREAST LUMPECTOMY WITH RADIOACTIVE SEED AND SENTINEL LYMPH NODE BIOPSY Right 04/15/2019   Procedure: RIGHT BREAST PARTIAL MASTECTOMY WITH RADIOACTIVE SEED AND SENTINEL LYMPH NODE BIOPSY;  Surgeon: Coralie Keens, MD;  Location: [facility];  Service: General;  Laterality: Right;  . CESAREAN SECTION     x2 ? w/appy  . CHOLECYSTECTOMY    . ESOPHAGOGASTRODUODENOSCOPY    . HERNIA REPAIR    . KNEE ARTHROSCOPY Right 11/2005  . LIVER BIOPSY    . PANCREATIC CYST EXCISION    . PORTACATH PLACEMENT Left 04/15/2019   Procedure: INSERTION PORT-A-CATH WITH ULTRASOUND;  Surgeon: Coralie Keens, MD;  Location: [facility];  Service: General;  Laterality: Left;  . SPLENECTOMY    . TONSILLECTOMY AND ADENOIDECTOMY      SOCIAL HISTORY: Social History   Socioeconomic History  . Marital status: Married    Spouse name: Lakie Mclouth  . Number of children: 2  . Years of education: Not on file  . Highest education level: Not on file  Occupational History  . Occupation: CSR    Employer: [employer]  . Occupation: retired  Scientific laboratory technician  . Financial resource strain: Not very hard  . Food insecurity    Worry: Never true    Inability: Never true  . Transportation needs    Medical: No    Non-medical: No  Tobacco Use  . Smoking status: Never Smoker  . Smokeless tobacco: Never Used  Substance and Sexual Activity  . Alcohol use: No  . Drug use: No  . Sexual activity: Not Currently  Lifestyle  . Physical activity    Days per week: 0 days     Minutes per session: 0 min  . Stress: Not at all  Relationships  . Social connections    Talks on phone: More than three times a week    Gets together: More than three times a week    Attends religious service: Not on file    Active member of club or organization: Not on file    Attends meetings of clubs or organizations: Not on file    Relationship status: Married  . Intimate partner violence    Fear of current or ex partner: No    Emotionally abused: No    Physically abused: No    Forced sexual activity: No  Other Topics Concern  . Not on file  Social History Narrative   Regular Exercise -  NO    FAMILY HISTORY: Family History  Problem Relation Age of Onset  . Liver disease Mother   . Dementia Mother   . Diabetes Mother        borderline  . Coronary artery disease Father   . Heart attack Father   . Hypertension Father   .  Heart disease Father   . Cancer Other        leukemia  . Stroke Maternal Grandfather   . Hyperlipidemia Brother        Amyloidosis  . Diabetes Maternal Grandmother   . Cancer Paternal Uncle        unknown  . Heart attack Paternal Grandmother   . Heart attack Paternal Uncle   . Hypertension Brother   . Diabetes Daughter        borderline  . Colon cancer Neg Hx     Review of Systems - Oncology    PHYSICAL EXAMINATION  ECOG PERFORMANCE STATUS: 1 - Symptomatic but completely ambulatory  Vitals:   05/13/19 0843  BP: 137/66  Pulse: 91  Resp: 18  Temp: 98.3 F (36.8 C)  SpO2: 98%    Physical Exam: No concerns for breast cancer recurrence  LABORATORY DATA:  CBC    Component Value Date/Time   WBC 14.1 (H) 05/13/2019 0820   WBC 27.4 (H) 04/16/2019 0430   RBC 4.07 05/13/2019 0820   HGB 11.8 (L) 05/13/2019 0820   HGB 11.5 (L) 01/08/2014 1121   HCT 37.2 05/13/2019 0820   HCT 36.1 01/08/2014 1121   PLT 453 (H) 05/13/2019 0820   PLT 692 (H) 01/08/2014 1121   MCV 91.4 05/13/2019 0820   MCV 87.2 01/08/2014 1121   MCH 29.0  05/13/2019 0820   MCHC 31.7 05/13/2019 0820   RDW 13.3 05/13/2019 0820   RDW 15.5 (H) 01/08/2014 1121   LYMPHSABS 4.6 (H) 05/13/2019 0820   LYMPHSABS 7.4 (H) 01/08/2014 1121   MONOABS 1.4 (H) 05/13/2019 0820   MONOABS 1.4 (H) 01/08/2014 1121   EOSABS 0.7 (H) 05/13/2019 0820   EOSABS 0.3 01/08/2014 1121   BASOSABS 0.1 05/13/2019 0820   BASOSABS 0.0 01/08/2014 1121    CMP     Component Value Date/Time   NA 141 05/13/2019 0820   NA 146 (H) 05/19/2018 0843   K 4.1 05/13/2019 0820   CL 106 05/13/2019 0820   CO2 21 (L) 05/13/2019 0820   GLUCOSE 115 (H) 05/13/2019 0820   BUN 22 05/13/2019 0820   BUN 19 05/19/2018 0843   CREATININE 0.86 05/13/2019 0820   CREATININE 0.96 10/09/2017 1022   CALCIUM 9.6 05/13/2019 0820   PROT 6.9 05/13/2019 0820   PROT 6.7 05/19/2018 0843   ALBUMIN 4.2 05/13/2019 0820   ALBUMIN 4.6 05/19/2018 0843   AST 13 (L) 05/13/2019 0820   ALT 14 05/13/2019 0820   ALKPHOS 59 05/13/2019 0820   BILITOT 0.4 05/13/2019 0820   GFRNONAA >60 05/13/2019 0820   GFRNONAA 61 10/09/2017 1022   GFRAA >60 05/13/2019 0820   GFRAA 71 10/09/2017 1022       ASSESSMENT and THERAPY PLAN:   Malignant neoplasm of upper-outer quadrant of right breast in female, estrogen receptor positive ([place]) Routine screening mammogram detected a 1.3cm mass in the right breast, 9 o'clock position, no axillary adenopathy. 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  T1c N0 stage Ia  Recommendations: 1. 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 Adj chemo with Taxol-Herceptin foll by Herceptin maintenance for 1 year started 05/13/2019 3. Adjuvant radiation therapy followed by 4. Adjuvant antiestrogen therapy  Enrolled in SWOG 1714 trials.   ------------------------------------------------------------------------------------------------------------------------- Echo EF 55 to 60% Antiemetics were reviewed and prescriptions have been  sent Chemo consent obtained, chemo education completed Return to clinic in 1 week for toxicity check and for cycle 2.   Orders Placed This Encounter  Procedures  . CBC with Differential (Cancer Center Only)    Standing Status:   Standing    Number of Occurrences:   20    Standing Expiration Date:   05/12/2020  . CMP ([place] [facility] only)    Standing Status:   Standing    Number of Occurrences:   20    Standing Expiration Date:   05/12/2020  . PHYSICIAN COMMUNICATION ORDER    A baseline Echo/[place] should be obtained prior to initiation of Trastuzumab, at 3, 6, 9 months during Trastuzumab treatment.    All questions were answered. The patient knows to call the clinic with any problems, questions or concerns. We can certainly see the patient much sooner if necessary. This note was electronically signed. Harriette Ohara, MD 05/13/2019

## 2019-05-14 ENCOUNTER — Telehealth: Payer: Self-pay | Admitting: *Deleted

## 2019-05-14 NOTE — Telephone Encounter (Signed)
-----   Message from Scot Dock, RN sent at 05/14/2019  8:00 AM EST ----- Regarding: 1st time chemo follow up for Dr. Lindi Adie - tolerated well Treatment on 11/18. Thanks

## 2019-05-14 NOTE — Telephone Encounter (Signed)
Called pt to see how she did with her treatment yest.  She reports doing well & denies any problems except blurry vision in one eye.  Her BS was @ 170 this am.  Instructed to watch her carbohydrates & BS related to steroid she received which should come back down quickly & hopefully blurry vision will also improve.  If not, she will contact her eye dr.  She is eating well & states she knows how to reach [place] & expressed appreciation for call. She also knows her next appt.

## 2019-05-19 NOTE — Progress Notes (Signed)
Patient Care Team: [name], [name] as PCP - General (Internal Medicine) [name], [name], MD as PCP - Cardiology (Cardiology) [name], RN as Oncology Nurse Navigator [name], RN as Oncology Nurse Navigator  DIAGNOSIS:    ICD-10-CM   1. Malignant neoplasm of upper-outer quadrant of right breast in female, estrogen receptor positive ([name])  C50.411    Z17.0     SUMMARY OF ONCOLOGIC HISTORY: Oncology History  Malignant neoplasm of upper-outer quadrant of right breast in female, estrogen receptor positive ([name])  03/16/2019 Cancer Staging   Staging form: Breast, AJCC 8th Edition - Clinical stage from 03/16/2019: Stage IA (cT1c, cN0, cM0, G2, ER+, PR+, HER2+) - Signed by [name], NP on 03/25/2019   03/25/2019 Initial Diagnosis   Routine screening mammogram detected a 1.3cm mass in the right breast, 9 o'clock position, no axillary adenopathy. 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    04/15/2019 Surgery   Right lumpectomy [name]): IDC, grade 2, 1.3cm, with intermediate grade DCIS, clear margins, and 1 right axillary lymph node negative   04/15/2019 Cancer Staging   Staging form: Breast, AJCC 8th Edition - Pathologic stage from 04/15/2019: Stage IA (pT1c, pN0, cM0, G2, ER+, PR+, HER2+) - Signed by [name], NP on 04/29/2019   05/13/2019 -  Chemotherapy   Weekly Taxol and Herceptin x 12, then maintenance Herceptin every 3 weeks x 1 year     CHIEF COMPLIANT: Cycle 2 Taxol Herceptin  INTERVAL HISTORY: Margaret Norman is a 68 y.o. with above-mentioned history of right breast cancer who underwent a lumpectomy. She is currently onnadjuvant chemotherapy with Taxol Herceptin. She presents to the clinic today for cycle 2 and a toxicity check.   REVIEW OF SYSTEMS:   Constitutional: Denies fevers, chills or abnormal weight loss Eyes: Denies blurriness of vision Ears, nose, mouth,  throat, and face: Denies mucositis or sore throat Respiratory: Denies cough, dyspnea or wheezes Cardiovascular: Denies palpitation, chest discomfort Gastrointestinal: Denies nausea, heartburn or change in bowel habits Skin: Denies abnormal skin rashes Lymphatics: Denies new lymphadenopathy or easy bruising Neurological: Denies numbness, tingling or new weaknesses Behavioral/Psych: Mood is stable, no new changes  Extremities: No lower extremity edema Breast: denies any pain or lumps or nodules in either breasts All other systems were reviewed with the patient and are negative.  I have reviewed the past medical history, past surgical history, social history and family history with the patient and they are unchanged from previous note.  ALLERGIES:  is allergic to invokana [canagliflozin] and amoxicillin.  MEDICATIONS:  Current Outpatient Medications  Medication Sig Dispense Refill  . acetaminophen (TYLENOL) 500 MG tablet Take 1,000 mg by mouth every 6 (six) hours as needed for moderate pain.    Marland Kitchen aspirin 81 MG EC tablet Take 81 mg by mouth daily.      . Blood Glucose Monitoring Suppl (ONETOUCH VERIO FLEX SYSTEM) W/DEVICE KIT 1 kit by Does not apply route 3 (three) times daily. 1 kit 1  . buPROPion (WELLBUTRIN XL) 150 MG 24 hr tablet Take 1 tablet (150 mg total) by mouth every morning. 90 tablet 3  . Cyanocobalamin (VITAMIN B12 PO) Take 2,500 mcg by mouth daily.    . empagliflozin (JARDIANCE) 25 MG TABS tablet Take 25 mg by mouth daily. 30 tablet 11  . fluticasone (FLONASE) 50 MCG/ACT nasal spray Place 2 sprays into both nostrils daily. 16 g 6  . glucose  blood (ONETOUCH VERIO) test strip Test three times daily as directed. DX: E11.9 300 each 3  . Insulin Pen Needle (B-D ULTRAFINE III SHORT PEN) 31G X 8 MM MISC USE DAILY WITH LANTUS  INSULIN 90 each 1  . LANTUS SOLOSTAR 100 UNIT/ML Solostar Pen INJECT SUBCUTANEOUSLY 30  UNITS AT BEDTIME 30 mL 3  . lidocaine-prilocaine (EMLA) cream Apply to  affected area once 30 g 3  . metFORMIN (GLUCOPHAGE) 1000 MG tablet TAKE 1 TABLET BY MOUTH  TWICE DAILY WITH MEALS 180 tablet 3  . olmesartan (BENICAR) 40 MG tablet Take 1 tablet (40 mg total) by mouth daily. 90 tablet 3  . omeprazole (PRILOSEC) 40 MG capsule TAKE 1 CAPSULE BY MOUTH  DAILY 90 capsule 3  . ondansetron (ZOFRAN) 8 MG tablet Take 1 tablet (8 mg total) by mouth 2 (two) times daily as needed (Nausea or vomiting). 30 tablet 1  . ONE TOUCH LANCETS MISC Test three times daily as directed. DX: E11.9 300 each 3  . Probiotic Product (PROBIOTIC ADVANCED PO) Take 1 tablet by mouth at bedtime.     . prochlorperazine (COMPAZINE) 10 MG tablet Take 1 tablet (10 mg total) by mouth every 6 (six) hours as needed (Nausea or vomiting). 30 tablet 1  . pyridOXINE (VITAMIN B-6) 100 MG tablet Take 100 mg by mouth daily.     . rosuvastatin (CRESTOR) 10 MG tablet Take 1 tablet (10 mg total) by mouth daily. 90 tablet 3  . sertraline (ZOLOFT) 100 MG tablet Take 1 tablet (100 mg total) by mouth daily. 90 tablet 3  . TRULICITY 1.5 MM/7.6KG SOPN INJECT THE CONTENTS OF 1  PEN SUBCUTANEOUSLY WEEKLY  AS DIRECTED 6 mL 2  . Vitamin E 180 MG CAPS Take 180 mg by mouth daily.     No current facility-administered medications for this visit.     PHYSICAL EXAMINATION: ECOG PERFORMANCE STATUS: 1 - Symptomatic but completely ambulatory  Vitals:   05/20/19 0828  BP: 133/67  Pulse: (!) 101  Temp: 98.5 F (36.9 C)  SpO2: 99%   Filed Weights   05/20/19 0828  Weight: 146 lb 3.2 oz (66.3 kg)    GENERAL: alert, no distress and comfortable SKIN: skin color, texture, turgor are normal, no rashes or significant lesions EYES: normal, Conjunctiva are pink and non-injected, sclera clear OROPHARYNX: no exudate, no erythema and lips, buccal mucosa, and tongue normal  NECK: supple, thyroid normal size, non-tender, without nodularity LYMPH: no palpable lymphadenopathy in the cervical, axillary or inguinal LUNGS: clear to  auscultation and percussion with normal breathing effort HEART: regular rate & rhythm and no murmurs and no lower extremity edema ABDOMEN: abdomen soft, non-tender and normal bowel sounds MUSCULOSKELETAL: no cyanosis of digits and no clubbing  NEURO: alert & oriented x 3 with fluent speech, no focal motor/sensory deficits EXTREMITIES: No lower extremity edema  LABORATORY DATA:  I have reviewed the data as listed CMP Latest Ref Rng & Units 05/13/2019 04/16/2019 04/15/2019  Glucose 70 - 99 mg/dL 115(H) 95 138(H)  BUN 8 - 23 mg/dL 22 17 20   Creatinine 0.44 - 1.00 mg/dL 0.86 0.92 1.03(H)  Sodium 135 - 145 mmol/L 141 142 140  Potassium 3.5 - 5.1 mmol/L 4.1 4.1 4.2  Chloride 98 - 111 mmol/L 106 109 108  CO2 22 - 32 mmol/L 21(L) 20(L) 22  Calcium 8.9 - 10.3 mg/dL 9.6 9.0 9.3  Total Protein 6.5 - 8.1 g/dL 6.9 - -  Total Bilirubin 0.3 - 1.2 mg/dL 0.4 - -  Alkaline Phos 38 - 126 U/L 59 - -  AST 15 - 41 U/L 13(L) - -  ALT 0 - 44 U/L 14 - -    Lab Results  Component Value Date   WBC 14.1 (H) 05/13/2019   HGB 11.8 (L) 05/13/2019   HCT 37.2 05/13/2019   MCV 91.4 05/13/2019   PLT 453 (H) 05/13/2019   NEUTROABS 7.3 05/13/2019    ASSESSMENT & PLAN:  Malignant neoplasm of upper-outer quadrant of right breast in female, estrogen receptor positive ([place]) Routine screening mammogram detected a 1.3cm mass in the right breast, 9 o'clock position, no axillary adenopathy. 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 T1c N0 stage Ia  Recommendations: 1. 04/15/19: Breast conserving surgery: Right lumpectomy [name]): IDC, grade 2, 1.3cm, with intermediate grade DCIS, clear margins, and 1 right axillary lymph node negative 2.Adj chemo with Taxol-Herceptin foll by Herceptin maintenance for 1 year started 05/13/2019 3. Adjuvant radiation therapy followed by 4. Adjuvant antiestrogen therapy  Enrolled in SWOG 1714 trials.   ------------------------------------------------------------------------------------------------------------------------- Current treatment: Cycle 2 Taxol Herceptin Echo EF 55 to 60%  Chemo toxicities: 1.  Diarrhea: I instructed her to take Imodium 2.  Mild nausea 3.  Decreased appetite No neuropathy.  Return to clinic in weekly for chemo and every other week for follow-up with me.    No orders of the defined types were placed in this encounter.  The patient has a good understanding of the overall plan. she agrees with it. she will call with any problems that may develop before the next visit here.  Nicholas Lose, MD 05/20/2019  Julious Oka Dorshimer, am acting as scribe for Dr. Nicholas Lose.  I have reviewed the above documentation for accuracy and completeness, and I agree with the above.

## 2019-05-20 ENCOUNTER — Inpatient Hospital Stay: Payer: Medicare Other

## 2019-05-20 ENCOUNTER — Encounter: Payer: Self-pay | Admitting: *Deleted

## 2019-05-20 ENCOUNTER — Other Ambulatory Visit: Payer: Self-pay

## 2019-05-20 ENCOUNTER — Inpatient Hospital Stay (HOSPITAL_BASED_OUTPATIENT_CLINIC_OR_DEPARTMENT_OTHER): Payer: Medicare Other | Admitting: Hematology and Oncology

## 2019-05-20 VITALS — HR 92

## 2019-05-20 DIAGNOSIS — C50411 Malignant neoplasm of upper-outer quadrant of right female breast: Secondary | ICD-10-CM

## 2019-05-20 DIAGNOSIS — Z170 Estrogen receptor positive status [ER+]: Secondary | ICD-10-CM | POA: Diagnosis not present

## 2019-05-20 DIAGNOSIS — Z95828 Presence of other vascular implants and grafts: Secondary | ICD-10-CM

## 2019-05-20 DIAGNOSIS — Z5112 Encounter for antineoplastic immunotherapy: Secondary | ICD-10-CM | POA: Diagnosis not present

## 2019-05-20 LAB — CBC WITH DIFFERENTIAL (CANCER CENTER ONLY)
Abs Immature Granulocytes: 0.04 10*3/uL (ref 0.00–0.07)
Basophils Absolute: 0.1 10*3/uL (ref 0.0–0.1)
Basophils Relative: 1 %
Eosinophils Absolute: 0.6 10*3/uL — ABNORMAL HIGH (ref 0.0–0.5)
Eosinophils Relative: 5 %
HCT: 34.7 % — ABNORMAL LOW (ref 36.0–46.0)
Hemoglobin: 11.1 g/dL — ABNORMAL LOW (ref 12.0–15.0)
Immature Granulocytes: 0 %
Lymphocytes Relative: 34 %
Lymphs Abs: 4.1 10*3/uL — ABNORMAL HIGH (ref 0.7–4.0)
MCH: 29.1 pg (ref 26.0–34.0)
MCHC: 32 g/dL (ref 30.0–36.0)
MCV: 91.1 fL (ref 80.0–100.0)
Monocytes Absolute: 0.6 10*3/uL (ref 0.1–1.0)
Monocytes Relative: 5 %
Neutro Abs: 6.7 10*3/uL (ref 1.7–7.7)
Neutrophils Relative %: 55 %
Platelet Count: 443 10*3/uL — ABNORMAL HIGH (ref 150–400)
RBC: 3.81 MIL/uL — ABNORMAL LOW (ref 3.87–5.11)
RDW: 13.2 % (ref 11.5–15.5)
WBC Count: 12.1 10*3/uL — ABNORMAL HIGH (ref 4.0–10.5)
nRBC: 0 % (ref 0.0–0.2)

## 2019-05-20 LAB — CMP (CANCER CENTER ONLY)
ALT: 26 U/L (ref 0–44)
AST: 21 U/L (ref 15–41)
Albumin: 4 g/dL (ref 3.5–5.0)
Alkaline Phosphatase: 56 U/L (ref 38–126)
Anion gap: 12 (ref 5–15)
BUN: 15 mg/dL (ref 8–23)
CO2: 22 mmol/L (ref 22–32)
Calcium: 9.1 mg/dL (ref 8.9–10.3)
Chloride: 110 mmol/L (ref 98–111)
Creatinine: 0.82 mg/dL (ref 0.44–1.00)
GFR, Est AFR Am: >60 mL/min (ref >60)
GFR, Estimated: >60 mL/min (ref >60)
Glucose, Bld: 142 mg/dL — ABNORMAL HIGH (ref 70–99)
Potassium: 4 mmol/L (ref 3.5–5.1)
Sodium: 144 mmol/L (ref 135–145)
Total Bilirubin: 0.3 mg/dL (ref 0.3–1.2)
Total Protein: 6.5 g/dL (ref 6.5–8.1)

## 2019-05-20 MED ORDER — DEXAMETHASONE SODIUM PHOSPHATE 10 MG/ML IJ SOLN
10.0000 mg | Freq: Once | INTRAMUSCULAR | Status: AC
  Administered 2019-05-20: 10 mg via INTRAVENOUS

## 2019-05-20 MED ORDER — DIPHENHYDRAMINE HCL 50 MG/ML IJ SOLN
25.0000 mg | Freq: Once | INTRAMUSCULAR | Status: AC
  Administered 2019-05-20: 10:00:00 25 mg via INTRAVENOUS

## 2019-05-20 MED ORDER — SODIUM CHLORIDE 0.9% FLUSH
10.0000 mL | INTRAVENOUS | Status: DC | PRN
  Administered 2019-05-20: 10 mL via INTRAVENOUS
  Filled 2019-05-20: qty 10

## 2019-05-20 MED ORDER — SODIUM CHLORIDE 0.9 % IV SOLN
80.0000 mg/m2 | Freq: Once | INTRAVENOUS | Status: AC
  Administered 2019-05-20: 138 mg via INTRAVENOUS
  Filled 2019-05-20: qty 23

## 2019-05-20 MED ORDER — HEPARIN SOD (PORK) LOCK FLUSH 100 UNIT/ML IV SOLN
500.0000 [IU] | Freq: Once | INTRAVENOUS | Status: AC | PRN
  Administered 2019-05-20: 500 [IU]
  Filled 2019-05-20: qty 5

## 2019-05-20 MED ORDER — FAMOTIDINE IN NACL 20-0.9 MG/50ML-% IV SOLN
INTRAVENOUS | Status: AC
  Filled 2019-05-20: qty 50

## 2019-05-20 MED ORDER — DEXAMETHASONE SODIUM PHOSPHATE 10 MG/ML IJ SOLN
INTRAMUSCULAR | Status: AC
  Filled 2019-05-20: qty 1

## 2019-05-20 MED ORDER — FAMOTIDINE IN NACL 20-0.9 MG/50ML-% IV SOLN
20.0000 mg | Freq: Once | INTRAVENOUS | Status: AC
  Administered 2019-05-20: 10:00:00 20 mg via INTRAVENOUS

## 2019-05-20 MED ORDER — SODIUM CHLORIDE 0.9% FLUSH
10.0000 mL | INTRAVENOUS | Status: DC | PRN
  Administered 2019-05-20: 12:00:00 10 mL
  Filled 2019-05-20: qty 10

## 2019-05-20 MED ORDER — DIPHENHYDRAMINE HCL 50 MG/ML IJ SOLN
INTRAMUSCULAR | Status: AC
  Filled 2019-05-20: qty 1

## 2019-05-20 MED ORDER — SODIUM CHLORIDE 0.9 % IV SOLN
Freq: Once | INTRAVENOUS | Status: AC
  Administered 2019-05-20: 09:00:00 via INTRAVENOUS
  Filled 2019-05-20: qty 250

## 2019-05-20 MED ORDER — ACETAMINOPHEN 325 MG PO TABS
ORAL_TABLET | ORAL | Status: AC
  Filled 2019-05-20: qty 2

## 2019-05-20 MED ORDER — ACETAMINOPHEN 325 MG PO TABS
650.0000 mg | ORAL_TABLET | Freq: Once | ORAL | Status: AC
  Administered 2019-05-20: 650 mg via ORAL

## 2019-05-20 MED ORDER — TRASTUZUMAB-ANNS CHEMO 150 MG IV SOLR
150.0000 mg | Freq: Once | INTRAVENOUS | Status: AC
  Administered 2019-05-20: 10:00:00 150 mg via INTRAVENOUS
  Filled 2019-05-20: qty 7.14

## 2019-05-20 NOTE — Assessment & Plan Note (Addendum)
Routine screening mammogram detected a 1.3cm mass in the right breast, 9 o'clock position, no axillary adenopathy. 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 T1c N0 stage Ia  Recommendations: 1. 04/15/19: Breast conserving surgery: Right lumpectomy [name]): IDC, grade 2, 1.3cm, with intermediate grade DCIS, clear margins, and 1 right axillary lymph node negative 2.Adj chemo with Taxol-Herceptin foll by Herceptin maintenance for 1 year started 05/13/2019 3. Adjuvant radiation therapy followed by 4. Adjuvant antiestrogen therapy  Enrolled in SWOG 1714 trials.  ------------------------------------------------------------------------------------------------------------------------- Current treatment: Cycle 2 Taxol Herceptin Echo EF 55 to 60%  Chemo toxicities:  Return to clinic weekly for chemo and every other week for follow-up with me

## 2019-05-20 NOTE — Patient Instructions (Signed)
[facility] Discharge Instructions for Patients Receiving Chemotherapy  Today you received the following chemotherapy agents: Trastuzumab, Paclitaxel  To help prevent nausea and vomiting after your treatment, we encourage you to take your nausea medication as directed.   If you develop nausea and vomiting that is not controlled by your nausea medication, call the clinic.   BELOW ARE SYMPTOMS THAT SHOULD BE REPORTED IMMEDIATELY:  *FEVER GREATER THAN 100.5 F  *CHILLS WITH OR WITHOUT FEVER  NAUSEA AND VOMITING THAT IS NOT CONTROLLED WITH YOUR NAUSEA MEDICATION  *UNUSUAL SHORTNESS OF BREATH  *UNUSUAL BRUISING OR BLEEDING  TENDERNESS IN MOUTH AND THROAT WITH OR WITHOUT PRESENCE OF ULCERS  *URINARY PROBLEMS  *BOWEL PROBLEMS  UNUSUAL RASH Items with * indicate a potential emergency and should be followed up as soon as possible.  Feel free to call the clinic should you have any questions or concerns. The clinic phone number is [phone].  Please show the CHEMO ALERT CARD at check-in to the Emergency Department and triage nurse.   

## 2019-05-20 NOTE — Research (Signed)
05/20/2019 at 1:27pm - DCP-001 study notes- The pt was into the [facility] this morning for her physical exam and chemotherapy.  The research nurse introduced the pt to the DCP-001 study while she was in the infusion room.  The pt was given the consent form and hipaa form to take home and read. The pt is aware that this study involves a one-time consent, and collection of demographic variables, with the majority of data collected from her medical record. The pt was told that her participation is voluntary.  The research nurse and pt agreed to meet at her next office visit on 05/27/19 to discuss her study participation.  The pt was thanked for her willingness to read the and consider participation in the DCP-001 study.   Brion Aliment RN, BSN, CCRP Clinical Research Nurse 05/20/2019 1:30 PM

## 2019-05-27 ENCOUNTER — Inpatient Hospital Stay: Payer: Medicare Other

## 2019-05-27 ENCOUNTER — Encounter: Payer: Self-pay | Admitting: Internal Medicine

## 2019-05-27 ENCOUNTER — Other Ambulatory Visit: Payer: Self-pay

## 2019-05-27 ENCOUNTER — Inpatient Hospital Stay: Payer: Medicare Other | Attending: Hematology and Oncology

## 2019-05-27 VITALS — BP 149/72 | HR 89 | Temp 98.3°F | Resp 16 | Wt 146.0 lb

## 2019-05-27 DIAGNOSIS — G629 Polyneuropathy, unspecified: Secondary | ICD-10-CM | POA: Insufficient documentation

## 2019-05-27 DIAGNOSIS — Z809 Family history of malignant neoplasm, unspecified: Secondary | ICD-10-CM | POA: Diagnosis not present

## 2019-05-27 DIAGNOSIS — C50811 Malignant neoplasm of overlapping sites of right female breast: Secondary | ICD-10-CM | POA: Diagnosis present

## 2019-05-27 DIAGNOSIS — Z006 Encounter for examination for normal comparison and control in clinical research program: Secondary | ICD-10-CM | POA: Insufficient documentation

## 2019-05-27 DIAGNOSIS — Z806 Family history of leukemia: Secondary | ICD-10-CM | POA: Diagnosis not present

## 2019-05-27 DIAGNOSIS — Z170 Estrogen receptor positive status [ER+]: Secondary | ICD-10-CM

## 2019-05-27 DIAGNOSIS — R197 Diarrhea, unspecified: Secondary | ICD-10-CM | POA: Insufficient documentation

## 2019-05-27 DIAGNOSIS — Z5112 Encounter for antineoplastic immunotherapy: Secondary | ICD-10-CM | POA: Insufficient documentation

## 2019-05-27 LAB — CBC WITH DIFFERENTIAL (CANCER CENTER ONLY)
Abs Immature Granulocytes: 0.03 10*3/uL (ref 0.00–0.07)
Basophils Absolute: 0.1 10*3/uL (ref 0.0–0.1)
Basophils Relative: 1 %
Eosinophils Absolute: 0.5 10*3/uL (ref 0.0–0.5)
Eosinophils Relative: 5 %
HCT: 34.2 % — ABNORMAL LOW (ref 36.0–46.0)
Hemoglobin: 10.8 g/dL — ABNORMAL LOW (ref 12.0–15.0)
Immature Granulocytes: 0 %
Lymphocytes Relative: 39 %
Lymphs Abs: 4 10*3/uL (ref 0.7–4.0)
MCH: 29.5 pg (ref 26.0–34.0)
MCHC: 31.6 g/dL (ref 30.0–36.0)
MCV: 93.4 fL (ref 80.0–100.0)
Monocytes Absolute: 0.9 10*3/uL (ref 0.1–1.0)
Monocytes Relative: 8 %
Neutro Abs: 4.9 10*3/uL (ref 1.7–7.7)
Neutrophils Relative %: 47 %
Platelet Count: 519 10*3/uL — ABNORMAL HIGH (ref 150–400)
RBC: 3.66 MIL/uL — ABNORMAL LOW (ref 3.87–5.11)
RDW: 13.6 % (ref 11.5–15.5)
WBC Count: 10.4 10*3/uL (ref 4.0–10.5)
nRBC: 0 % (ref 0.0–0.2)

## 2019-05-27 LAB — CMP (CANCER CENTER ONLY)
ALT: 27 U/L (ref 0–44)
AST: 20 U/L (ref 15–41)
Albumin: 3.7 g/dL (ref 3.5–5.0)
Alkaline Phosphatase: 60 U/L (ref 38–126)
Anion gap: 11 (ref 5–15)
BUN: 11 mg/dL (ref 8–23)
CO2: 22 mmol/L (ref 22–32)
Calcium: 9.2 mg/dL (ref 8.9–10.3)
Chloride: 113 mmol/L — ABNORMAL HIGH (ref 98–111)
Creatinine: 0.76 mg/dL (ref 0.44–1.00)
GFR, Est AFR Am: >60 mL/min (ref >60)
GFR, Estimated: >60 mL/min (ref >60)
Glucose, Bld: 109 mg/dL — ABNORMAL HIGH (ref 70–99)
Potassium: 3.9 mmol/L (ref 3.5–5.1)
Sodium: 146 mmol/L — ABNORMAL HIGH (ref 135–145)
Total Bilirubin: <0.2 mg/dL — ABNORMAL LOW (ref 0.3–1.2)
Total Protein: 6.2 g/dL — ABNORMAL LOW (ref 6.5–8.1)

## 2019-05-27 MED ORDER — SODIUM CHLORIDE 0.9 % IV SOLN
Freq: Once | INTRAVENOUS | Status: AC
  Administered 2019-05-27: 11:00:00 via INTRAVENOUS
  Filled 2019-05-27: qty 250

## 2019-05-27 MED ORDER — DEXAMETHASONE SODIUM PHOSPHATE 10 MG/ML IJ SOLN
10.0000 mg | Freq: Once | INTRAMUSCULAR | Status: AC
  Administered 2019-05-27: 10 mg via INTRAVENOUS

## 2019-05-27 MED ORDER — TRULICITY 3 MG/0.5ML ~~LOC~~ SOAJ: 3.0000 mg | SUBCUTANEOUS | 2 refills | Status: DC

## 2019-05-27 MED ORDER — DIPHENHYDRAMINE HCL 50 MG/ML IJ SOLN
25.0000 mg | Freq: Once | INTRAMUSCULAR | Status: AC
  Administered 2019-05-27: 11:00:00 25 mg via INTRAVENOUS

## 2019-05-27 MED ORDER — SODIUM CHLORIDE 0.9% FLUSH
10.0000 mL | INTRAVENOUS | Status: DC | PRN
  Administered 2019-05-27: 13:00:00 10 mL
  Filled 2019-05-27: qty 10

## 2019-05-27 MED ORDER — DIPHENHYDRAMINE HCL 50 MG/ML IJ SOLN
INTRAMUSCULAR | Status: AC
  Filled 2019-05-27: qty 1

## 2019-05-27 MED ORDER — ACETAMINOPHEN 325 MG PO TABS
ORAL_TABLET | ORAL | Status: AC
  Filled 2019-05-27: qty 2

## 2019-05-27 MED ORDER — TRASTUZUMAB-ANNS CHEMO 150 MG IV SOLR
150.0000 mg | Freq: Once | INTRAVENOUS | Status: AC
  Administered 2019-05-27: 150 mg via INTRAVENOUS
  Filled 2019-05-27: qty 7.14

## 2019-05-27 MED ORDER — ACETAMINOPHEN 325 MG PO TABS
650.0000 mg | ORAL_TABLET | Freq: Once | ORAL | Status: AC
  Administered 2019-05-27: 11:00:00 650 mg via ORAL

## 2019-05-27 MED ORDER — HEPARIN SOD (PORK) LOCK FLUSH 100 UNIT/ML IV SOLN
500.0000 [IU] | Freq: Once | INTRAVENOUS | Status: AC | PRN
  Administered 2019-05-27: 13:00:00 500 [IU]
  Filled 2019-05-27: qty 5

## 2019-05-27 MED ORDER — FAMOTIDINE IN NACL 20-0.9 MG/50ML-% IV SOLN
20.0000 mg | Freq: Once | INTRAVENOUS | Status: AC
  Administered 2019-05-27: 20 mg via INTRAVENOUS

## 2019-05-27 MED ORDER — SODIUM CHLORIDE 0.9 % IV SOLN
80.0000 mg/m2 | Freq: Once | INTRAVENOUS | Status: AC
  Administered 2019-05-27: 138 mg via INTRAVENOUS
  Filled 2019-05-27: qty 23

## 2019-05-27 MED ORDER — FAMOTIDINE IN NACL 20-0.9 MG/50ML-% IV SOLN
INTRAVENOUS | Status: AC
  Filled 2019-05-27: qty 50

## 2019-05-27 MED ORDER — DEXAMETHASONE SODIUM PHOSPHATE 10 MG/ML IJ SOLN
INTRAMUSCULAR | Status: AC
  Filled 2019-05-27: qty 1

## 2019-05-27 NOTE — Patient Instructions (Signed)
[facility] Discharge Instructions for Patients Receiving Chemotherapy  Today you received the following chemotherapy agents Taxol   To help prevent nausea and vomiting after your treatment, we encourage you to take your nausea medication as directed.   If you develop nausea and vomiting that is not controlled by your nausea medication, call the clinic.   BELOW ARE SYMPTOMS THAT SHOULD BE REPORTED IMMEDIATELY:  *FEVER GREATER THAN 100.5 F  *CHILLS WITH OR WITHOUT FEVER  NAUSEA AND VOMITING THAT IS NOT CONTROLLED WITH YOUR NAUSEA MEDICATION  *UNUSUAL SHORTNESS OF BREATH  *UNUSUAL BRUISING OR BLEEDING  TENDERNESS IN MOUTH AND THROAT WITH OR WITHOUT PRESENCE OF ULCERS  *URINARY PROBLEMS  *BOWEL PROBLEMS  UNUSUAL RASH Items with * indicate a potential emergency and should be followed up as soon as possible.  Feel free to call the clinic you have any questions or concerns. The clinic phone number is [phone].  Please show the CHEMO ALERT CARD at check-in to the Emergency Department and triage nurse.   

## 2019-06-02 ENCOUNTER — Encounter: Payer: Self-pay | Admitting: *Deleted

## 2019-06-02 NOTE — Progress Notes (Signed)
Patient Care Team: [name], K[place] as PCP - General (Internal Medicine) [name], [name], MD as PCP - Cardiology (Cardiology) [name], RN as Oncology Nurse Navigator [name], RN as Oncology Nurse Navigator  DIAGNOSIS:    ICD-10-CM   1. Malignant neoplasm of upper-outer quadrant of right breast in female, estrogen receptor positive ([place])  C50.411    Z17.0     SUMMARY OF ONCOLOGIC HISTORY: Oncology History  Malignant neoplasm of upper-outer quadrant of right breast in female, estrogen receptor positive ([place])  03/16/2019 Cancer Staging   Staging form: Breast, AJCC 8th Edition - Clinical stage from 03/16/2019: Stage IA (cT1c, cN0, cM0, G2, ER+, PR+, HER2+) - Signed by [name], NP on 03/25/2019   03/25/2019 Initial Diagnosis   Routine screening mammogram detected a 1.3cm mass in the right breast, 9 o'clock position, no axillary adenopathy. 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    04/15/2019 Surgery   Right lumpectomy [name]): IDC, grade 2, 1.3cm, with intermediate grade DCIS, clear margins, and 1 right axillary lymph node negative   04/15/2019 Cancer Staging   Staging form: Breast, AJCC 8th Edition - Pathologic stage from 04/15/2019: Stage IA (pT1c, pN0, cM0, G2, ER+, PR+, HER2+) - Signed by [name], NP on 04/29/2019   05/13/2019 -  Chemotherapy   Weekly Taxol and Herceptin x 12, then maintenance Herceptin every 3 weeks x 1 year     CHIEF COMPLIANT: Cycle 4 Taxol Herceptin  INTERVAL HISTORY: Margaret Norman is a 68 y.o. with above-mentioned history of right breast cancer who underwent a lumpectomy. She is currently on adjuvant chemotherapy with Taxol Herceptin. She presents to the clinic today for cycle 4 and a toxicity check.  She had diarrhea that was after eating outside.  Did not have diarrhea elsewhere.  Did have mild nausea for which she took antiemetics.   REVIEW OF SYSTEMS:   Constitutional: Denies fevers, chills or abnormal weight loss Eyes: Denies blurriness of vision Ears, nose, mouth, throat, and face: Denies mucositis or sore throat Respiratory: Denies cough, dyspnea or wheezes Cardiovascular: Denies palpitation, chest discomfort Gastrointestinal: Denies nausea, heartburn or change in bowel habits Skin: Denies abnormal skin rashes Lymphatics: Denies new lymphadenopathy or easy bruising Neurological: Denies numbness, tingling or new weaknesses Behavioral/Psych: Mood is stable, no new changes  Extremities: No lower extremity edema Breast: denies any pain or lumps or nodules in either breasts All other systems were reviewed with the patient and are negative.  I have reviewed the past medical history, past surgical history, social history and family history with the patient and they are unchanged from previous note.  ALLERGIES:  is allergic to invokana [canagliflozin] and amoxicillin.  MEDICATIONS:  Current Outpatient Medications  Medication Sig Dispense Refill  . acetaminophen (TYLENOL) 500 MG tablet Take 1,000 mg by mouth every 6 (six) hours as needed for moderate pain.    Marland Kitchen aspirin 81 MG EC tablet Take 81 mg by mouth daily.      . Blood Glucose Monitoring Suppl (ONETOUCH VERIO FLEX SYSTEM) W/DEVICE KIT 1 kit by Does not apply route 3 (three) times daily. 1 kit 1  . buPROPion (WELLBUTRIN XL) 150 MG 24 hr tablet Take 1 tablet (150 mg total) by mouth every morning. 90 tablet 3  . Cyanocobalamin (VITAMIN B12 PO) Take 2,500 mcg by mouth daily.    . Dulaglutide (TRULICITY) 3 AL/9.3XT SOPN Inject 3 mg into the skin  once a week. 4 pen 2  . empagliflozin (JARDIANCE) 25 MG TABS tablet Take 25 mg by mouth daily. 30 tablet 11  . fluticasone (FLONASE) 50 MCG/ACT nasal spray Place 2 sprays into both nostrils daily. 16 g 6  . glucose blood (ONETOUCH VERIO) test strip Test three times daily as directed. DX: E11.9 300 each 3  . Insulin Pen Needle  (B-D ULTRAFINE III SHORT PEN) 31G X 8 MM MISC USE DAILY WITH LANTUS  INSULIN 90 each 1  . LANTUS SOLOSTAR 100 UNIT/ML Solostar Pen INJECT SUBCUTANEOUSLY 30  UNITS AT BEDTIME 30 mL 3  . lidocaine-prilocaine (EMLA) cream Apply to affected area once 30 g 3  . metFORMIN (GLUCOPHAGE) 1000 MG tablet TAKE 1 TABLET BY MOUTH  TWICE DAILY WITH MEALS 180 tablet 3  . olmesartan (BENICAR) 40 MG tablet Take 1 tablet (40 mg total) by mouth daily. 90 tablet 3  . omeprazole (PRILOSEC) 40 MG capsule TAKE 1 CAPSULE BY MOUTH  DAILY 90 capsule 3  . ondansetron (ZOFRAN) 8 MG tablet Take 1 tablet (8 mg total) by mouth 2 (two) times daily as needed (Nausea or vomiting). 30 tablet 1  . ONE TOUCH LANCETS MISC Test three times daily as directed. DX: E11.9 300 each 3  . Probiotic Product (PROBIOTIC ADVANCED PO) Take 1 tablet by mouth at bedtime.     . prochlorperazine (COMPAZINE) 10 MG tablet Take 1 tablet (10 mg total) by mouth every 6 (six) hours as needed (Nausea or vomiting). 30 tablet 1  . pyridOXINE (VITAMIN B-6) 100 MG tablet Take 100 mg by mouth daily.     . rosuvastatin (CRESTOR) 10 MG tablet Take 1 tablet (10 mg total) by mouth daily. 90 tablet 3  . sertraline (ZOLOFT) 100 MG tablet Take 1 tablet (100 mg total) by mouth daily. 90 tablet 3  . Vitamin E 180 MG CAPS Take 180 mg by mouth daily.     No current facility-administered medications for this visit.     PHYSICAL EXAMINATION: ECOG PERFORMANCE STATUS: 1 - Symptomatic but completely ambulatory  Vitals:   06/03/19 0956  BP: (!) 139/56  Pulse: 86  Resp: 17  Temp: 98.3 F (36.8 C)  SpO2: 99%   Filed Weights   06/03/19 0956  Weight: 148 lb 4.8 oz (67.3 kg)    GENERAL: alert, no distress and comfortable SKIN: skin color, texture, turgor are normal, no rashes or significant lesions EYES: normal, Conjunctiva are pink and non-injected, sclera clear OROPHARYNX: no exudate, no erythema and lips, buccal mucosa, and tongue normal  NECK: supple, thyroid  normal size, non-tender, without nodularity LYMPH: no palpable lymphadenopathy in the cervical, axillary or inguinal LUNGS: clear to auscultation and percussion with normal breathing effort HEART: regular rate & rhythm and no murmurs and no lower extremity edema ABDOMEN: abdomen soft, non-tender and normal bowel sounds MUSCULOSKELETAL: no cyanosis of digits and no clubbing  NEURO: alert & oriented x 3 with fluent speech, no focal motor/sensory deficits EXTREMITIES: No lower extremity edema  LABORATORY DATA:  I have reviewed the data as listed CMP Latest Ref Rng & Units 05/27/2019 05/20/2019 05/13/2019  Glucose 70 - 99 mg/dL 109(H) 142(H) 115(H)  BUN 8 - 23 mg/dL _0 Creatinine 0.44 - 1.00 mg/dL 0.76 0.82 0.86  Sodium 135 - 145 mmol/L 146(H) 144 141  Potassium 3.5 - 5.1 mmol/L 3.9 4.0 4.1  Chloride 98 - 111 mmol/L 113(H) 110 106  CO2 22 - 32 mmol/L 22 22 21(L)  Calcium  8.9 - 10.3 mg/dL 9.2 9.1 9.6  Total Protein 6.5 - 8.1 g/dL 6.2(L) 6.5 6.9  Total Bilirubin 0.3 - 1.2 mg/dL <0.2(L) 0.3 0.4  Alkaline Phos 38 - 126 U/L 60 56 59  AST 15 - 41 U/L 20 21 13(L)  ALT 0 - 44 U/L _0 Lab Results  Component Value Date   WBC 8.9 06/03/2019   HGB 10.3 (L) 06/03/2019   HCT 32.3 (L) 06/03/2019   MCV 92.6 06/03/2019   PLT 592 (H) 06/03/2019   NEUTROABS 4.3 06/03/2019    ASSESSMENT & PLAN:  Malignant neoplasm of upper-outer quadrant of right breast in female, estrogen receptor positive ([place]) Routine screening mammogram detected a 1.3cm mass in the right breast, 9 o'clock position, no axillary adenopathy. 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 T1c N0 stage Ia  Recommendations: 1. 04/15/19: Breast conserving surgery: Right lumpectomy [name]): IDC, grade 2, 1.3cm, with intermediate grade DCIS, clear margins, and 1 right axillary lymph node negative 2.Adj chemo with Taxol-Herceptin foll by Herceptin maintenance for 1  yearstarted 05/13/2019 3. Adjuvant radiation therapy followed by 4. Adjuvant antiestrogen therapy  Enrolled inSWOG 1714 trials.  ------------------------------------------------------------------------------------------------------------------------- Current treatment: Cycle 4 Taxol Herceptin EchoEF 55 to 60%  Chemo toxicities: 1.  Diarrhea: I instructed her to take Imodium 2.  Mild nausea 3.  Decreased appetite 4.  Hair loss No neuropathy. Monitoring closely for chemo toxicities.   Return to clinic in weekly for chemo and every other week for follow-up with me.     No orders of the defined types were placed in this encounter.  The patient has a good understanding of the overall plan. she agrees with it. she will call with any problems that may develop before the next visit here.  Nicholas Lose, MD 06/03/2019  Julious Oka Dorshimer, am acting as scribe for Dr. Nicholas Lose.  I have reviewed the above documentation for accuracy and completeness, and I agree with the above.

## 2019-06-03 ENCOUNTER — Other Ambulatory Visit: Payer: Self-pay

## 2019-06-03 ENCOUNTER — Encounter: Payer: Self-pay | Admitting: Family

## 2019-06-03 ENCOUNTER — Inpatient Hospital Stay: Payer: Medicare Other

## 2019-06-03 ENCOUNTER — Inpatient Hospital Stay (HOSPITAL_BASED_OUTPATIENT_CLINIC_OR_DEPARTMENT_OTHER): Payer: Medicare Other | Admitting: Hematology and Oncology

## 2019-06-03 DIAGNOSIS — Z170 Estrogen receptor positive status [ER+]: Secondary | ICD-10-CM

## 2019-06-03 DIAGNOSIS — C50411 Malignant neoplasm of upper-outer quadrant of right female breast: Secondary | ICD-10-CM

## 2019-06-03 DIAGNOSIS — Z5112 Encounter for antineoplastic immunotherapy: Secondary | ICD-10-CM | POA: Diagnosis not present

## 2019-06-03 DIAGNOSIS — Z95828 Presence of other vascular implants and grafts: Secondary | ICD-10-CM

## 2019-06-03 LAB — CBC WITH DIFFERENTIAL (CANCER CENTER ONLY)
Abs Immature Granulocytes: 0.06 10*3/uL (ref 0.00–0.07)
Basophils Absolute: 0.1 10*3/uL (ref 0.0–0.1)
Basophils Relative: 1 %
Eosinophils Absolute: 0.4 10*3/uL (ref 0.0–0.5)
Eosinophils Relative: 4 %
HCT: 32.3 % — ABNORMAL LOW (ref 36.0–46.0)
Hemoglobin: 10.3 g/dL — ABNORMAL LOW (ref 12.0–15.0)
Immature Granulocytes: 1 %
Lymphocytes Relative: 37 %
Lymphs Abs: 3.3 10*3/uL (ref 0.7–4.0)
MCH: 29.5 pg (ref 26.0–34.0)
MCHC: 31.9 g/dL (ref 30.0–36.0)
MCV: 92.6 fL (ref 80.0–100.0)
Monocytes Absolute: 0.8 10*3/uL (ref 0.1–1.0)
Monocytes Relative: 9 %
Neutro Abs: 4.3 10*3/uL (ref 1.7–7.7)
Neutrophils Relative %: 48 %
Platelet Count: 592 10*3/uL — ABNORMAL HIGH (ref 150–400)
RBC: 3.49 MIL/uL — ABNORMAL LOW (ref 3.87–5.11)
RDW: 14.1 % (ref 11.5–15.5)
WBC Count: 8.9 10*3/uL (ref 4.0–10.5)
nRBC: 0.2 % (ref 0.0–0.2)

## 2019-06-03 LAB — CMP (CANCER CENTER ONLY)
ALT: 21 U/L (ref 0–44)
AST: 18 U/L (ref 15–41)
Albumin: 3.5 g/dL (ref 3.5–5.0)
Alkaline Phosphatase: 50 U/L (ref 38–126)
Anion gap: 9 (ref 5–15)
BUN: 12 mg/dL (ref 8–23)
CO2: 25 mmol/L (ref 22–32)
Calcium: 8.6 mg/dL — ABNORMAL LOW (ref 8.9–10.3)
Chloride: 111 mmol/L (ref 98–111)
Creatinine: 0.76 mg/dL (ref 0.44–1.00)
GFR, Est AFR Am: >60 mL/min (ref >60)
GFR, Estimated: >60 mL/min (ref >60)
Glucose, Bld: 100 mg/dL — ABNORMAL HIGH (ref 70–99)
Potassium: 3.7 mmol/L (ref 3.5–5.1)
Sodium: 145 mmol/L (ref 135–145)
Total Bilirubin: <0.2 mg/dL — ABNORMAL LOW (ref 0.3–1.2)
Total Protein: 5.8 g/dL — ABNORMAL LOW (ref 6.5–8.1)

## 2019-06-03 MED ORDER — ACETAMINOPHEN 325 MG PO TABS
ORAL_TABLET | ORAL | Status: AC
  Filled 2019-06-03: qty 2

## 2019-06-03 MED ORDER — SODIUM CHLORIDE 0.9% FLUSH
10.0000 mL | INTRAVENOUS | Status: DC | PRN
  Administered 2019-06-03 (×2): 10 mL via INTRAVENOUS
  Filled 2019-06-03: qty 10

## 2019-06-03 MED ORDER — DIPHENHYDRAMINE HCL 50 MG/ML IJ SOLN
INTRAMUSCULAR | Status: AC
  Filled 2019-06-03: qty 1

## 2019-06-03 MED ORDER — TRASTUZUMAB-ANNS CHEMO 150 MG IV SOLR
2.0000 mg/kg | Freq: Once | INTRAVENOUS | Status: AC
  Administered 2019-06-03: 147 mg via INTRAVENOUS
  Filled 2019-06-03: qty 7

## 2019-06-03 MED ORDER — FAMOTIDINE IN NACL 20-0.9 MG/50ML-% IV SOLN
INTRAVENOUS | Status: AC
  Filled 2019-06-03: qty 50

## 2019-06-03 MED ORDER — DEXAMETHASONE SODIUM PHOSPHATE 10 MG/ML IJ SOLN
INTRAMUSCULAR | Status: AC
  Filled 2019-06-03: qty 1

## 2019-06-03 MED ORDER — ACETAMINOPHEN 325 MG PO TABS
650.0000 mg | ORAL_TABLET | Freq: Once | ORAL | Status: AC
  Administered 2019-06-03: 650 mg via ORAL

## 2019-06-03 MED ORDER — SODIUM CHLORIDE 0.9 % IV SOLN
80.0000 mg/m2 | Freq: Once | INTRAVENOUS | Status: AC
  Administered 2019-06-03: 138 mg via INTRAVENOUS
  Filled 2019-06-03: qty 23

## 2019-06-03 MED ORDER — FAMOTIDINE IN NACL 20-0.9 MG/50ML-% IV SOLN
20.0000 mg | Freq: Once | INTRAVENOUS | Status: AC
  Administered 2019-06-03: 20 mg via INTRAVENOUS

## 2019-06-03 MED ORDER — DEXAMETHASONE SODIUM PHOSPHATE 10 MG/ML IJ SOLN
10.0000 mg | Freq: Once | INTRAMUSCULAR | Status: AC
  Administered 2019-06-03: 10 mg via INTRAVENOUS

## 2019-06-03 MED ORDER — DIPHENHYDRAMINE HCL 50 MG/ML IJ SOLN
25.0000 mg | Freq: Once | INTRAMUSCULAR | Status: AC
  Administered 2019-06-03: 25 mg via INTRAVENOUS

## 2019-06-03 MED ORDER — HEPARIN SOD (PORK) LOCK FLUSH 100 UNIT/ML IV SOLN
500.0000 [IU] | Freq: Once | INTRAVENOUS | Status: AC | PRN
  Administered 2019-06-03: 500 [IU]
  Filled 2019-06-03: qty 5

## 2019-06-03 MED ORDER — SODIUM CHLORIDE 0.9 % IV SOLN
Freq: Once | INTRAVENOUS | Status: AC
  Administered 2019-06-03: 10:00:00 via INTRAVENOUS
  Filled 2019-06-03: qty 250

## 2019-06-03 MED ORDER — SODIUM CHLORIDE 0.9% FLUSH
10.0000 mL | INTRAVENOUS | Status: DC | PRN
  Administered 2019-06-03: 10 mL
  Filled 2019-06-03: qty 10

## 2019-06-03 NOTE — Patient Instructions (Signed)

## 2019-06-03 NOTE — Assessment & Plan Note (Signed)
Routine screening mammogram detected a 1.3cm mass in the right breast, 9 o'clock position, no axillary adenopathy. 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 T1c N0 stage Ia  Recommendations: 1. 04/15/19: Breast conserving surgery: Right lumpectomy Margaret Norman): IDC, grade 2, 1.3cm, with intermediate grade DCIS, clear margins, and 1 right axillary lymph node negative 2.Adj chemo with Taxol-Herceptin foll by Herceptin maintenance for 1 yearstarted 05/13/2019 3. Adjuvant radiation therapy followed by 4. Adjuvant antiestrogen therapy  Enrolled inSWOG 1714 trials.  ------------------------------------------------------------------------------------------------------------------------- Current treatment: Cycle 4 Taxol Herceptin EchoEF 55 to 60%  Chemo toxicities: 1.  Diarrhea: I instructed her to take Imodium 2.  Mild nausea 3.  Decreased appetite No neuropathy.  Return to clinic in weekly for chemo and every other week for follow-up with me.

## 2019-06-03 NOTE — Patient Instructions (Signed)
[facility] Discharge Instructions for Patients Receiving Chemotherapy  Today you received the following chemotherapy agents: Trastuzumab, Paclitaxel  To help prevent nausea and vomiting after your treatment, we encourage you to take your nausea medication as directed.   If you develop nausea and vomiting that is not controlled by your nausea medication, call the clinic.   BELOW ARE SYMPTOMS THAT SHOULD BE REPORTED IMMEDIATELY:  *FEVER GREATER THAN 100.5 F  *CHILLS WITH OR WITHOUT FEVER  NAUSEA AND VOMITING THAT IS NOT CONTROLLED WITH YOUR NAUSEA MEDICATION  *UNUSUAL SHORTNESS OF BREATH  *UNUSUAL BRUISING OR BLEEDING  TENDERNESS IN MOUTH AND THROAT WITH OR WITHOUT PRESENCE OF ULCERS  *URINARY PROBLEMS  *BOWEL PROBLEMS  UNUSUAL RASH Items with * indicate a potential emergency and should be followed up as soon as possible.  Feel free to call the clinic should you have any questions or concerns. The clinic phone number is [phone].  Please show the CHEMO ALERT CARD at check-in to the Emergency Department and triage nurse.   

## 2019-06-10 ENCOUNTER — Inpatient Hospital Stay: Payer: Medicare Other

## 2019-06-10 ENCOUNTER — Other Ambulatory Visit: Payer: Self-pay

## 2019-06-10 VITALS — BP 159/74 | HR 98 | Temp 98.2°F | Resp 17 | Ht 62.0 in | Wt 144.8 lb

## 2019-06-10 DIAGNOSIS — Z170 Estrogen receptor positive status [ER+]: Secondary | ICD-10-CM

## 2019-06-10 DIAGNOSIS — C50411 Malignant neoplasm of upper-outer quadrant of right female breast: Secondary | ICD-10-CM

## 2019-06-10 DIAGNOSIS — Z5112 Encounter for antineoplastic immunotherapy: Secondary | ICD-10-CM | POA: Diagnosis not present

## 2019-06-10 DIAGNOSIS — Z95828 Presence of other vascular implants and grafts: Secondary | ICD-10-CM

## 2019-06-10 LAB — CBC WITH DIFFERENTIAL (CANCER CENTER ONLY)
Abs Immature Granulocytes: 0.11 10*3/uL — ABNORMAL HIGH (ref 0.00–0.07)
Basophils Absolute: 0 10*3/uL (ref 0.0–0.1)
Basophils Relative: 0 %
Eosinophils Absolute: 0 10*3/uL (ref 0.0–0.5)
Eosinophils Relative: 0 %
HCT: 33.4 % — ABNORMAL LOW (ref 36.0–46.0)
Hemoglobin: 10.7 g/dL — ABNORMAL LOW (ref 12.0–15.0)
Immature Granulocytes: 1 %
Lymphocytes Relative: 22 %
Lymphs Abs: 3.3 10*3/uL (ref 0.7–4.0)
MCH: 29.5 pg (ref 26.0–34.0)
MCHC: 32 g/dL (ref 30.0–36.0)
MCV: 92 fL (ref 80.0–100.0)
Monocytes Absolute: 0.6 10*3/uL (ref 0.1–1.0)
Monocytes Relative: 4 %
Neutro Abs: 11.4 10*3/uL — ABNORMAL HIGH (ref 1.7–7.7)
Neutrophils Relative %: 73 %
Platelet Count: 592 10*3/uL — ABNORMAL HIGH (ref 150–400)
RBC: 3.63 MIL/uL — ABNORMAL LOW (ref 3.87–5.11)
RDW: 14.5 % (ref 11.5–15.5)
WBC Count: 15.5 10*3/uL — ABNORMAL HIGH (ref 4.0–10.5)
nRBC: 0 % (ref 0.0–0.2)

## 2019-06-10 LAB — CMP (CANCER CENTER ONLY)
ALT: 23 U/L (ref 0–44)
AST: 15 U/L (ref 15–41)
Albumin: 4.1 g/dL (ref 3.5–5.0)
Alkaline Phosphatase: 60 U/L (ref 38–126)
Anion gap: 13 (ref 5–15)
BUN: 15 mg/dL (ref 8–23)
CO2: 21 mmol/L — ABNORMAL LOW (ref 22–32)
Calcium: 9.5 mg/dL (ref 8.9–10.3)
Chloride: 108 mmol/L (ref 98–111)
Creatinine: 0.78 mg/dL (ref 0.44–1.00)
GFR, Est AFR Am: >60 mL/min (ref >60)
GFR, Estimated: >60 mL/min (ref >60)
Glucose, Bld: 163 mg/dL — ABNORMAL HIGH (ref 70–99)
Potassium: 4.3 mmol/L (ref 3.5–5.1)
Sodium: 142 mmol/L (ref 135–145)
Total Bilirubin: 0.3 mg/dL (ref 0.3–1.2)
Total Protein: 6.7 g/dL (ref 6.5–8.1)

## 2019-06-10 MED ORDER — SODIUM CHLORIDE 0.9% FLUSH
10.0000 mL | Freq: Once | INTRAVENOUS | Status: AC
  Administered 2019-06-10: 10 mL
  Filled 2019-06-10: qty 10

## 2019-06-10 MED ORDER — SODIUM CHLORIDE 0.9% FLUSH
10.0000 mL | INTRAVENOUS | Status: DC | PRN
  Administered 2019-06-10: 10 mL
  Filled 2019-06-10: qty 10

## 2019-06-10 MED ORDER — TRASTUZUMAB-ANNS CHEMO 150 MG IV SOLR
2.0000 mg/kg | Freq: Once | INTRAVENOUS | Status: AC
  Administered 2019-06-10: 147 mg via INTRAVENOUS
  Filled 2019-06-10: qty 7

## 2019-06-10 MED ORDER — DIPHENHYDRAMINE HCL 50 MG/ML IJ SOLN
INTRAMUSCULAR | Status: AC
  Filled 2019-06-10: qty 1

## 2019-06-10 MED ORDER — FAMOTIDINE IN NACL 20-0.9 MG/50ML-% IV SOLN
20.0000 mg | Freq: Once | INTRAVENOUS | Status: AC
  Administered 2019-06-10: 20 mg via INTRAVENOUS

## 2019-06-10 MED ORDER — SODIUM CHLORIDE 0.9 % IV SOLN
Freq: Once | INTRAVENOUS | Status: AC
  Filled 2019-06-10: qty 250

## 2019-06-10 MED ORDER — ACETAMINOPHEN 325 MG PO TABS
650.0000 mg | ORAL_TABLET | Freq: Once | ORAL | Status: AC
  Administered 2019-06-10: 650 mg via ORAL

## 2019-06-10 MED ORDER — HEPARIN SOD (PORK) LOCK FLUSH 100 UNIT/ML IV SOLN
500.0000 [IU] | Freq: Once | INTRAVENOUS | Status: AC | PRN
  Administered 2019-06-10: 500 [IU]
  Filled 2019-06-10: qty 5

## 2019-06-10 MED ORDER — DIPHENHYDRAMINE HCL 50 MG/ML IJ SOLN
25.0000 mg | Freq: Once | INTRAMUSCULAR | Status: AC
  Administered 2019-06-10: 25 mg via INTRAVENOUS

## 2019-06-10 MED ORDER — FAMOTIDINE IN NACL 20-0.9 MG/50ML-% IV SOLN
INTRAVENOUS | Status: AC
  Filled 2019-06-10: qty 50

## 2019-06-10 MED ORDER — DEXAMETHASONE SODIUM PHOSPHATE 10 MG/ML IJ SOLN
INTRAMUSCULAR | Status: AC
  Filled 2019-06-10: qty 1

## 2019-06-10 MED ORDER — SODIUM CHLORIDE 0.9 % IV SOLN
80.0000 mg/m2 | Freq: Once | INTRAVENOUS | Status: AC
  Administered 2019-06-10: 138 mg via INTRAVENOUS
  Filled 2019-06-10: qty 23

## 2019-06-10 MED ORDER — ACETAMINOPHEN 325 MG PO TABS
ORAL_TABLET | ORAL | Status: AC
  Filled 2019-06-10: qty 2

## 2019-06-10 MED ORDER — DEXAMETHASONE SODIUM PHOSPHATE 10 MG/ML IJ SOLN
10.0000 mg | Freq: Once | INTRAMUSCULAR | Status: AC
  Administered 2019-06-10: 10 mg via INTRAVENOUS

## 2019-06-10 NOTE — Patient Instructions (Signed)
[facility] Discharge Instructions for Patients Receiving Chemotherapy  Today you received the following chemotherapy agents: Trastuzumab-anns (Kanjinti) and Paclitaxel (Taxol)  To help prevent nausea and vomiting after your treatment, we encourage you to take your nausea medication as directed.   If you develop nausea and vomiting that is not controlled by your nausea medication, call the clinic.   BELOW ARE SYMPTOMS THAT SHOULD BE REPORTED IMMEDIATELY:  *FEVER GREATER THAN 100.5 F  *CHILLS WITH OR WITHOUT FEVER  NAUSEA AND VOMITING THAT IS NOT CONTROLLED WITH YOUR NAUSEA MEDICATION  *UNUSUAL SHORTNESS OF BREATH  *UNUSUAL BRUISING OR BLEEDING  TENDERNESS IN MOUTH AND THROAT WITH OR WITHOUT PRESENCE OF ULCERS  *URINARY PROBLEMS  *BOWEL PROBLEMS  UNUSUAL RASH Items with * indicate a potential emergency and should be followed up as soon as possible.  Feel free to call the clinic should you have any questions or concerns. The clinic phone number is [phone].  Please show the [place] at check-in to the Emergency Department and triage nurse.  Coronavirus (COVID-19) Are you at risk?  Are you at risk for the Coronavirus (COVID-19)?  To be considered HIGH RISK for Coronavirus (COVID-19), you have to meet the following criteria:  . Traveled to [place], [place], [place], [place] or [place]; or in the [place] to [place], [place], [place], or [place]; and have fever, cough, and shortness of breath within the last 2 weeks of travel OR . Been in close contact with a person diagnosed with COVID-19 within the last 2 weeks and have fever, cough, and shortness of breath . IF YOU DO NOT MEET THESE CRITERIA, YOU ARE CONSIDERED LOW RISK FOR COVID-19.  What to do if you are HIGH RISK for COVID-19?  Marland Kitchen If you are having a medical emergency, call 911. . Seek medical care right away. Before you go to a doctor's office, urgent care or emergency department,  call ahead and tell them about your recent travel, contact with someone diagnosed with COVID-19, and your symptoms. You should receive instructions from your physician's office regarding next steps of care.  . When you arrive at healthcare provider, tell the healthcare staff immediately you have returned from visiting [place], [place], [place], [place] or [place]; or traveled in the [place] to [place], [place], [place], or [place]; in the last two weeks or you have been in close contact with a person diagnosed with COVID-19 in the last 2 weeks.   . Tell the health care staff about your symptoms: fever, cough and shortness of breath. . After you have been seen by a medical provider, you will be either: o Tested for (COVID-19) and discharged home on quarantine except to seek medical care if symptoms worsen, and asked to  - Stay home and avoid contact with others until you get your results (4-5 days)  - Avoid travel on public transportation if possible (such as bus, train, or airplane) or o Sent to the Emergency Department by EMS for evaluation, COVID-19 testing, and possible admission depending on your condition and test results.  What to do if you are LOW RISK for COVID-19?  Reduce your risk of any infection by using the same precautions used for avoiding the common cold or flu:  Marland Kitchen Wash your hands often with soap and warm water for at least 20 seconds.  If soap and water are not readily available, use an alcohol-based hand sanitizer with at least 60% alcohol.  Marland Kitchen  If coughing or sneezing, cover your mouth and nose by coughing or sneezing into the elbow areas of your shirt or coat, into a tissue or into your sleeve (not your hands). . Avoid shaking hands with others and consider head nods or verbal greetings only. . Avoid touching your eyes, nose, or mouth with unwashed hands.  . Avoid close contact with people who are sick. . Avoid places or events with large numbers of people in one  location, like concerts or sporting events. . Carefully consider travel plans you have or are making. . If you are planning any travel outside or inside the [place], visit the CDC's Travelers' Health webpage for the latest health notices. . If you have some symptoms but not all symptoms, continue to monitor at home and seek medical attention if your symptoms worsen. . If you are having a medical emergency, call 911.   ADDITIONAL HEALTHCARE OPTIONS FOR PATIENTS  [facility] / e-Visit: [email or web address]         [facility]: [phone]  [facility]: [phone]                   [facility]: [phone]   

## 2019-06-17 ENCOUNTER — Inpatient Hospital Stay: Payer: Medicare Other

## 2019-06-17 ENCOUNTER — Encounter: Payer: Self-pay | Admitting: *Deleted

## 2019-06-17 ENCOUNTER — Other Ambulatory Visit: Payer: Self-pay

## 2019-06-17 ENCOUNTER — Inpatient Hospital Stay (HOSPITAL_BASED_OUTPATIENT_CLINIC_OR_DEPARTMENT_OTHER): Payer: Medicare Other | Admitting: Adult Health

## 2019-06-17 ENCOUNTER — Encounter: Payer: Self-pay | Admitting: Adult Health

## 2019-06-17 VITALS — BP 131/63 | HR 97 | Temp 98.0°F | Resp 18 | Ht 62.0 in | Wt 144.0 lb

## 2019-06-17 DIAGNOSIS — C50411 Malignant neoplasm of upper-outer quadrant of right female breast: Secondary | ICD-10-CM

## 2019-06-17 DIAGNOSIS — G629 Polyneuropathy, unspecified: Secondary | ICD-10-CM | POA: Diagnosis not present

## 2019-06-17 DIAGNOSIS — Z006 Encounter for examination for normal comparison and control in clinical research program: Secondary | ICD-10-CM

## 2019-06-17 DIAGNOSIS — C50811 Malignant neoplasm of overlapping sites of right female breast: Secondary | ICD-10-CM

## 2019-06-17 DIAGNOSIS — Z95828 Presence of other vascular implants and grafts: Secondary | ICD-10-CM

## 2019-06-17 DIAGNOSIS — Z170 Estrogen receptor positive status [ER+]: Secondary | ICD-10-CM | POA: Diagnosis not present

## 2019-06-17 DIAGNOSIS — Z5112 Encounter for antineoplastic immunotherapy: Secondary | ICD-10-CM | POA: Diagnosis not present

## 2019-06-17 DIAGNOSIS — R197 Diarrhea, unspecified: Secondary | ICD-10-CM

## 2019-06-17 LAB — CBC WITH DIFFERENTIAL (CANCER CENTER ONLY)
Abs Immature Granulocytes: 0.04 10*3/uL (ref 0.00–0.07)
Basophils Absolute: 0.1 10*3/uL (ref 0.0–0.1)
Basophils Relative: 1 %
Eosinophils Absolute: 0.3 10*3/uL (ref 0.0–0.5)
Eosinophils Relative: 3 %
HCT: 35 % — ABNORMAL LOW (ref 36.0–46.0)
Hemoglobin: 11 g/dL — ABNORMAL LOW (ref 12.0–15.0)
Immature Granulocytes: 0 %
Lymphocytes Relative: 33 %
Lymphs Abs: 3.2 10*3/uL (ref 0.7–4.0)
MCH: 29.5 pg (ref 26.0–34.0)
MCHC: 31.4 g/dL (ref 30.0–36.0)
MCV: 93.8 fL (ref 80.0–100.0)
Monocytes Absolute: 0.8 10*3/uL (ref 0.1–1.0)
Monocytes Relative: 8 %
Neutro Abs: 5.1 10*3/uL (ref 1.7–7.7)
Neutrophils Relative %: 55 %
Platelet Count: 592 10*3/uL — ABNORMAL HIGH (ref 150–400)
RBC: 3.73 MIL/uL — ABNORMAL LOW (ref 3.87–5.11)
RDW: 14.8 % (ref 11.5–15.5)
WBC Count: 9.5 10*3/uL (ref 4.0–10.5)
nRBC: 0 % (ref 0.0–0.2)

## 2019-06-17 LAB — CMP (CANCER CENTER ONLY)
ALT: 21 U/L (ref 0–44)
AST: 17 U/L (ref 15–41)
Albumin: 3.8 g/dL (ref 3.5–5.0)
Alkaline Phosphatase: 60 U/L (ref 38–126)
Anion gap: 11 (ref 5–15)
BUN: 16 mg/dL (ref 8–23)
CO2: 23 mmol/L (ref 22–32)
Calcium: 9 mg/dL (ref 8.9–10.3)
Chloride: 109 mmol/L (ref 98–111)
Creatinine: 0.78 mg/dL (ref 0.44–1.00)
GFR, Est AFR Am: >60 mL/min (ref >60)
GFR, Estimated: >60 mL/min (ref >60)
Glucose, Bld: 177 mg/dL — ABNORMAL HIGH (ref 70–99)
Potassium: 4.4 mmol/L (ref 3.5–5.1)
Sodium: 143 mmol/L (ref 135–145)
Total Bilirubin: 0.2 mg/dL — ABNORMAL LOW (ref 0.3–1.2)
Total Protein: 6.6 g/dL (ref 6.5–8.1)

## 2019-06-17 LAB — RESEARCH LABS

## 2019-06-17 MED ORDER — SODIUM CHLORIDE 0.9 % IV SOLN
Freq: Once | INTRAVENOUS | Status: AC
  Filled 2019-06-17: qty 250

## 2019-06-17 MED ORDER — TRASTUZUMAB-ANNS CHEMO 150 MG IV SOLR
2.0000 mg/kg | Freq: Once | INTRAVENOUS | Status: AC
  Administered 2019-06-17: 126 mg via INTRAVENOUS
  Filled 2019-06-17: qty 6

## 2019-06-17 MED ORDER — DEXAMETHASONE SODIUM PHOSPHATE 10 MG/ML IJ SOLN
INTRAMUSCULAR | Status: AC
  Filled 2019-06-17: qty 1

## 2019-06-17 MED ORDER — DIPHENHYDRAMINE HCL 50 MG/ML IJ SOLN
25.0000 mg | Freq: Once | INTRAMUSCULAR | Status: AC
  Administered 2019-06-17: 25 mg via INTRAVENOUS

## 2019-06-17 MED ORDER — DEXAMETHASONE SODIUM PHOSPHATE 10 MG/ML IJ SOLN
10.0000 mg | Freq: Once | INTRAMUSCULAR | Status: AC
  Administered 2019-06-17: 10 mg via INTRAVENOUS

## 2019-06-17 MED ORDER — SODIUM CHLORIDE 0.9% FLUSH
10.0000 mL | Freq: Once | INTRAVENOUS | Status: AC
  Administered 2019-06-17: 10 mL
  Filled 2019-06-17: qty 10

## 2019-06-17 MED ORDER — FAMOTIDINE IN NACL 20-0.9 MG/50ML-% IV SOLN
INTRAVENOUS | Status: AC
  Filled 2019-06-17: qty 50

## 2019-06-17 MED ORDER — SODIUM CHLORIDE 0.9% FLUSH
10.0000 mL | INTRAVENOUS | Status: DC | PRN
  Administered 2019-06-17: 10 mL
  Filled 2019-06-17: qty 10

## 2019-06-17 MED ORDER — HEPARIN SOD (PORK) LOCK FLUSH 100 UNIT/ML IV SOLN
500.0000 [IU] | Freq: Once | INTRAVENOUS | Status: AC | PRN
  Administered 2019-06-17: 500 [IU]
  Filled 2019-06-17: qty 5

## 2019-06-17 MED ORDER — ACETAMINOPHEN 325 MG PO TABS
650.0000 mg | ORAL_TABLET | Freq: Once | ORAL | Status: AC
  Administered 2019-06-17: 650 mg via ORAL

## 2019-06-17 MED ORDER — ACETAMINOPHEN 325 MG PO TABS
ORAL_TABLET | ORAL | Status: AC
  Filled 2019-06-17: qty 2

## 2019-06-17 MED ORDER — SODIUM CHLORIDE 0.9 % IV SOLN
80.0000 mg/m2 | Freq: Once | INTRAVENOUS | Status: AC
  Administered 2019-06-17: 138 mg via INTRAVENOUS
  Filled 2019-06-17: qty 23

## 2019-06-17 MED ORDER — TRASTUZUMAB-ANNS CHEMO 150 MG IV SOLR: 2.0000 mg/kg | Freq: Once | INTRAVENOUS | Status: DC

## 2019-06-17 MED ORDER — FAMOTIDINE IN NACL 20-0.9 MG/50ML-% IV SOLN
20.0000 mg | Freq: Once | INTRAVENOUS | Status: AC
  Administered 2019-06-17: 20 mg via INTRAVENOUS

## 2019-06-17 MED ORDER — DIPHENHYDRAMINE HCL 50 MG/ML IJ SOLN
INTRAMUSCULAR | Status: AC
  Filled 2019-06-17: qty 1

## 2019-06-17 NOTE — Progress Notes (Signed)
Patient's current weight and calculated dose for Kanjinti of 130 mg is > 10% outside dosing range. Confirmed with Wilber Bihari, NP to use patient's current weight of 65.3 kg for Kanjinti dose (2 mg/kg) for today's treatment. Orders have been updated to reflect change.  Leron Croak, PharmD, BCPS PGY2 Hematology/Oncology Pharmacy Resident 06/17/2019 10:55 AM

## 2019-06-17 NOTE — Assessment & Plan Note (Addendum)
Routine screening mammogram detected a 1.3cm mass in the right breast, 9 o'clock position, no axillary adenopathy. 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 T1c N0 stage Ia  Recommendations: 1. 04/15/19: Breast conserving surgery: Right lumpectomy [name]): IDC, grade 2, 1.3cm, with intermediate grade DCIS, clear margins, and 1 right axillary lymph node negative 2.Adj chemo with Taxol-Herceptin foll by Herceptin maintenance for 1 yearstarted 05/13/2019 3. Adjuvant radiation therapy followed by 4. Adjuvant antiestrogen therapy  Enrolled inSWOG 1714 trials.  ------------------------------------------------------------------------------------------------------------------------- Current treatment: Cycle 6 Taxol Herceptin EchoEF 55 to 60% 04/15/2019 / Chemo toxicities: 1.  Diarrhea: controlled with imodium 2.  Mild nausea 3.  Decreased appetite Grade 1 intermittent peripheral neuroapthy, currently resolved, monitoring closely.  Return to clinic in weekly for chemo and every other week for follow-up with Dr. Estill Bakes APP.

## 2019-06-17 NOTE — Progress Notes (Signed)
[facility] Follow up:    Margaret Norman, [place] [address]   DIAGNOSIS: Cancer Staging Malignant neoplasm of upper-outer quadrant of right breast in female, estrogen receptor positive ([place]) Staging form: Breast, AJCC 8th Edition - Clinical stage from 03/16/2019: Stage IA (cT1c, cN0, cM0, G2, ER+, PR+, HER2+) - Signed by [name], NP on 03/25/2019 - Pathologic stage from 04/15/2019: Stage IA (pT1c, pN0, cM0, G2, ER+, PR+, HER2+) - Signed by [name], NP on 04/29/2019   SUMMARY OF ONCOLOGIC HISTORY: Oncology History  Malignant neoplasm of upper-outer quadrant of right breast in female, estrogen receptor positive ([place])  03/16/2019 Cancer Staging   Staging form: Breast, AJCC 8th Edition - Clinical stage from 03/16/2019: Stage IA (cT1c, cN0, cM0, G2, ER+, PR+, HER2+) - Signed by [name], NP on 03/25/2019   03/25/2019 Initial Diagnosis   Routine screening mammogram detected a 1.3cm mass in the right breast, 9 o'clock position, no axillary adenopathy. 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    04/15/2019 Surgery   Right lumpectomy [name]): IDC, grade 2, 1.3cm, with intermediate grade DCIS, clear margins, and 1 right axillary lymph node negative   04/15/2019 Cancer Staging   Staging form: Breast, AJCC 8th Edition - Pathologic stage from 04/15/2019: Stage IA (pT1c, pN0, cM0, G2, ER+, PR+, HER2+) - Signed by [name], NP on 04/29/2019   05/13/2019 -  Chemotherapy   Weekly Taxol and Herceptin x 12, then maintenance Herceptin every 3 weeks x 1 year     CURRENT THERAPY: Taxol/Herceptin  INTERVAL HISTORY: Margaret Norman 68 y.o. female returns for evaluation prior to receiving Taxol and Herceptin.  Today she will receive week 6 of treatment.  [name] is staying at home.  She does typical housework and takes care of her dogs.   She has three dogs.  She is living with her husband, daughter and grandson.  She is mildly fatigued, and takes a daily nap.  She is not exercising regularly. She has a mild intermittent peripheral neuropathy described as a numbness that happens on occasion in her fingertips.  Nothing in her toes.  She is not currently experiencing this today.    Patient Active Problem List   Diagnosis Date Noted  . Port-A-Cath in place 06/10/2019  . Breast cancer ([place]) 04/15/2019  . Second degree AV block   . Malignant neoplasm of upper-outer quadrant of right breast in female, estrogen receptor positive ([place]) 03/25/2019  . Thyromegaly 01/10/2018  . Overweight (BMI 25.0-29.9) 08/13/2017  . Upper respiratory tract infection 07/30/2017  . Type 2 diabetes mellitus with hyperglycemia, with long-term current use of insulin ([place]) 03/19/2017  . Thiamine deficiency 01/04/2017  . 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 Deficiency anemia 04/30/2016  . Routine general medical examination at a health care facility 04/27/2015  . Abdominal wall hernia 04/26/2015  . Family history of hemochromatosis 09/13/2014  . Acute maxillary sinusitis 05/14/2014  . Personal history of colonic polyps 03/10/2014  . 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 Lumbosacral spondylosis without myelopathy 05/12/2013  . Insomnia, persistent 02/04/2013  . Obesity (BMI 30-39.9) 02/04/2013  . Unspecified asthma, with exacerbation 09/22/2012  . Visit for screening mammogram 07/04/2012  . Nephrolithiasis   . HTN (hypertension)   . Gallstones   . LBBB (left bundle branch block)   . Leukocytosis 12/20/2008  . Hyperlipidemia with target LDL less than 100 09/04/2007  .  Chronic depression 09/04/2007  . GERD 09/04/2007    is allergic to invokana [canagliflozin] and amoxicillin.  MEDICAL HISTORY: Past Medical History:  Diagnosis Date  . Arthritis   . Chest pain    Nuclear, adenosine,  December, 2013, low risk nuclear scan with  small, moderate in intensity, fixed anteroseptal defect. This is possibly related to an LBBB versus small prior infarct. No ischemia  . Depression   . Gallstones 11-06  . GERD (gastroesophageal reflux disease)   . History of kidney stones   . HTN (hypertension)   . Hyperlipidemia   . Kidney mass 01/28/2014  . LBBB (left bundle branch block)   . Nephrolithiasis   . Pneumonia   . 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 Type II or unspecified type diabetes mellitus without mention of complication, not stated as uncontrolled     SURGICAL HISTORY: Past Surgical History:  Procedure Laterality Date  . APPENDECTOMY    . BREAST LUMPECTOMY WITH RADIOACTIVE SEED AND SENTINEL LYMPH NODE BIOPSY Right 04/15/2019   Procedure: RIGHT BREAST PARTIAL MASTECTOMY WITH RADIOACTIVE SEED AND SENTINEL LYMPH NODE BIOPSY;  Surgeon: Coralie Keens, MD;  Location: [facility];  Service: General;  Laterality: Right;  . CESAREAN SECTION     x2 ? w/appy  . CHOLECYSTECTOMY    . ESOPHAGOGASTRODUODENOSCOPY    . HERNIA REPAIR    . KNEE ARTHROSCOPY Right 11/2005  . LIVER BIOPSY    . PANCREATIC CYST EXCISION    . PORTACATH PLACEMENT Left 04/15/2019   Procedure: INSERTION PORT-A-CATH WITH ULTRASOUND;  Surgeon: Coralie Keens, MD;  Location: [facility];  Service: General;  Laterality: Left;  . SPLENECTOMY    . TONSILLECTOMY AND ADENOIDECTOMY      SOCIAL HISTORY: Social History   Socioeconomic History  . Marital status: Married    Spouse name: Trissa Molina  . Number of children: 2  . Years of education: Not on file  . Highest education level: Not on file  Occupational History  . Occupation: CSR    Employer: [employer]  . Occupation: retired  Tobacco Use  . Smoking status: Never Smoker  . Smokeless tobacco: Never Used  Substance and Sexual Activity  . Alcohol use: No  . Drug use: No  . Sexual activity: Not Currently  Other Topics Concern  . Not on file  Social History Narrative   Regular  Exercise -  NO   Social Determinants of Health   Financial Resource Strain:   . Difficulty of Paying Living Expenses: Not on file  Food Insecurity: No Food Insecurity  . Worried About Charity fundraiser in the Last Year: Never true  . Ran Out of Food in the Last Year: Never true  Transportation Needs:   . Lack of Transportation (Medical): Not on file  . Lack of Transportation (Non-Medical): Not on file  Physical Activity:   . Days of Exercise per Week: Not on file  . Minutes of Exercise per Session: Not on file  Stress: No Stress Concern Present  . Feeling of Stress : Not at all  Social Connections: Unknown  . Frequency of Communication with Friends and Family: Not on file  . Frequency of Social Gatherings with Friends and Family: Not on file  . Attends Religious Services: Not on file  . Active Member of Clubs or Organizations: Not on file  . Attends Archivist Meetings: Not on file  . Marital Status: Married  Human resources officer Violence: Not At Risk  . Fear  of Current or Ex-Partner: No  . Emotionally Abused: No  . Physically Abused: No  . Sexually Abused: No    FAMILY HISTORY: Family History  Problem Relation Age of Onset  . Liver disease Mother   . Dementia Mother   . Diabetes Mother        borderline  . Coronary artery disease Father   . Heart attack Father   . Hypertension Father   . Heart disease Father   . Cancer Other        leukemia  . Stroke Maternal Grandfather   . Hyperlipidemia Brother        Amyloidosis  . Diabetes Maternal Grandmother   . Cancer Paternal Uncle        unknown  . Heart attack Paternal Grandmother   . Heart attack Paternal Uncle   . Hypertension Brother   . Diabetes Daughter        borderline  . Colon cancer Neg Hx     Review of Systems  Constitutional: Positive for fatigue. Negative for appetite change, chills, fever and unexpected weight change.  HENT:   Negative for hearing loss, lump/mass and trouble swallowing.    Eyes: Negative for eye problems and icterus.  Respiratory: Negative for chest tightness, cough and shortness of breath.   Cardiovascular: Negative for chest pain, leg swelling and palpitations.  Gastrointestinal: Negative for abdominal distention, abdominal pain, constipation, diarrhea, nausea and vomiting.  Endocrine: Negative for hot flashes.  Genitourinary: Negative for difficulty urinating.   Musculoskeletal: Negative for arthralgias.  Skin: Negative for itching and rash.  Neurological: Positive for numbness. Negative for dizziness, extremity weakness and headaches.  Hematological: Negative for adenopathy. Does not bruise/bleed easily.  Psychiatric/Behavioral: Negative for depression. The patient is not nervous/anxious.       PHYSICAL EXAMINATION  ECOG PERFORMANCE STATUS: 1 - Symptomatic but completely ambulatory  Vitals:   06/17/19 0936  BP: 131/63  Pulse: 97  Resp: 18  Temp: 98 F (36.7 C)  SpO2: 100%    Physical Exam Constitutional:      Appearance: Normal appearance.  HENT:     Head: Normocephalic and atraumatic.     Mouth/Throat:     Mouth: Mucous membranes are moist.     Pharynx: Oropharynx is clear. No oropharyngeal exudate or posterior oropharyngeal erythema.  Eyes:     General: No scleral icterus.    Pupils: Pupils are equal, round, and reactive to light.  Cardiovascular:     Rate and Rhythm: Normal rate and regular rhythm.     Pulses: Normal pulses.  Pulmonary:     Effort: Pulmonary effort is normal.     Breath sounds: Normal breath sounds.  Abdominal:     General: Abdomen is flat. Bowel sounds are normal. There is no distension.     Palpations: Abdomen is soft.     Tenderness: There is no abdominal tenderness.  Musculoskeletal:        General: No swelling.     Cervical back: Neck supple.  Lymphadenopathy:     Cervical: No cervical adenopathy.  Skin:    General: Skin is warm and dry.     Capillary Refill: Capillary refill takes less than 2  seconds.     Findings: No rash.  Neurological:     General: No focal deficit present.     Mental Status: She is alert.  Psychiatric:        Mood and Affect: Mood normal.        Behavior: Behavior  normal.     LABORATORY DATA:  CBC    Component Value Date/Time   WBC 9.5 06/17/2019 0913   WBC 27.4 (H) 04/16/2019 0430   RBC 3.73 (L) 06/17/2019 0913   HGB 11.0 (L) 06/17/2019 0913   HGB 11.5 (L) 01/08/2014 1121   HCT 35.0 (L) 06/17/2019 0913   HCT 36.1 01/08/2014 1121   PLT 592 (H) 06/17/2019 0913   PLT 692 (H) 01/08/2014 1121   MCV 93.8 06/17/2019 0913   MCV 87.2 01/08/2014 1121   MCH 29.5 06/17/2019 0913   MCHC 31.4 06/17/2019 0913   RDW 14.8 06/17/2019 0913   RDW 15.5 (H) 01/08/2014 1121   LYMPHSABS 3.2 06/17/2019 0913   LYMPHSABS 7.4 (H) 01/08/2014 1121   MONOABS 0.8 06/17/2019 0913   MONOABS 1.4 (H) 01/08/2014 1121   EOSABS 0.3 06/17/2019 0913   EOSABS 0.3 01/08/2014 1121   BASOSABS 0.1 06/17/2019 0913   BASOSABS 0.0 01/08/2014 1121    CMP     Component Value Date/Time   NA 143 06/17/2019 0913   NA 146 (H) 05/19/2018 0843   K 4.4 06/17/2019 0913   CL 109 06/17/2019 0913   CO2 23 06/17/2019 0913   GLUCOSE 177 (H) 06/17/2019 0913   BUN 16 06/17/2019 0913   BUN 19 05/19/2018 0843   CREATININE 0.78 06/17/2019 0913   CREATININE 0.96 10/09/2017 1022   CALCIUM 9.0 06/17/2019 0913   PROT 6.6 06/17/2019 0913   PROT 6.7 05/19/2018 0843   ALBUMIN 3.8 06/17/2019 0913   ALBUMIN 4.6 05/19/2018 0843   AST 17 06/17/2019 0913   ALT 21 06/17/2019 0913   ALKPHOS 60 06/17/2019 0913   BILITOT 0.2 (L) 06/17/2019 0913   GFRNONAA >60 06/17/2019 0913   GFRNONAA 61 10/09/2017 1022   GFRAA >60 06/17/2019 0913   GFRAA 71 10/09/2017 1022          ASSESSMENT and THERAPY PLAN:   Malignant neoplasm of upper-outer quadrant of right breast in female, estrogen receptor positive ([place]) Routine screening mammogram detected a 1.3cm mass in the right breast, 9 o'clock position,  no axillary adenopathy. 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 T1c N0 stage Ia  Recommendations: 1. 04/15/19: Breast conserving surgery: Right lumpectomy [name]): IDC, grade 2, 1.3cm, with intermediate grade DCIS, clear margins, and 1 right axillary lymph node negative 2.Adj chemo with Taxol-Herceptin foll by Herceptin maintenance for 1 yearstarted 05/13/2019 3. Adjuvant radiation therapy followed by 4. Adjuvant antiestrogen therapy  Enrolled inSWOG 1714 trials.  ------------------------------------------------------------------------------------------------------------------------- Current treatment: Cycle 6 Taxol Herceptin EchoEF 55 to 60% 04/15/2019 / Chemo toxicities: 1.  Diarrhea: controlled with imodium 2.  Mild nausea 3.  Decreased appetite Grade 1 intermittent peripheral neuroapthy, currently resolved, monitoring closely.  Return to clinic in weekly for chemo and every other week for follow-up with Dr. Estill Bakes APP.    No orders of the defined types were placed in this encounter.   All questions were answered. The patient knows to call the clinic with any problems, questions or concerns. We can certainly see the patient much sooner if necessary.  A total of (15) minutes of face-to-face time was spent with this patient with greater than 50% of that time in counseling and care-coordination.  This note was electronically signed. Scot Dock, NP 06/17/2019

## 2019-06-17 NOTE — Research (Signed)
06/17/2019 at 11:23am- S1714 week 4 study notes-The pt was into the cancer center this morning for hercontinuedtaxol chemotherapy. The pt was given her week 4 questionnaires to complete upon arrival to the cancer center.The pt returned her completed questionnairesbefore any study procedure was performed.The pt's questionnaires were reviewed for completeness and accuracyby the research nurse.The pt's research blood (optional samples) were drawn today.  The research nurse met with the pt along with Tyrell Antonio, RN to conduct the pt'sweek 4neuropathy testing (neuropen and tuning fork assessments). [name], Dr. Geralyn Flash NP, met with the pt for her H/P exam. The NP and Dr. Lindi Adie completed the pt'sweek 4neuropathy forms for the study.  The pt denied currently taking the following medications: gabapentin, narcotics, tramadol, ibuprofen,and duloxetine.  The pt reports taking anti-depressants daily (Zoloft and Wellbutrin XL) and acetaminophen 500 mg "maybe once a month". The pt does take acetaminophen 642m weekly for her pre-meds, not for any neuropathy symptoms.  The pt denied any current interventions for neuropathy. The pt stated that there had been no change in her vitamin/supplements since her baseline visit.  The pt has not had any changes or modifications to her current taxol treatment.  The pt was thanked for her continued compliance and support of this study.  The pt's week 8 will be due in January 2021.   NBrion AlimentRN, BSN, CCRP Clinical Research Nurse 06/17/2019 11:27 AM

## 2019-06-17 NOTE — Research (Signed)
06/17/2019 at 10:59am - The pt said that she read over the DCP-001 study consent form, and she has decided to not participate in the study.  The pt was thanked for her time and consideration of the study.   Brion Aliment RN, BSN, CCRP Clinical Research Nurse 06/17/2019 11:00 AM

## 2019-06-17 NOTE — Patient Instructions (Signed)
[facility] Discharge Instructions for Patients Receiving Chemotherapy  Today you received the following chemotherapy agents: Trastuzumab-anns ([place]) and Paclitaxel (Taxol)  To help prevent nausea and vomiting after your treatment, we encourage you to take your nausea medication as directed.   If you develop nausea and vomiting that is not controlled by your nausea medication, call the clinic.   BELOW ARE SYMPTOMS THAT SHOULD BE REPORTED IMMEDIATELY:  *FEVER GREATER THAN 100.5 F  *CHILLS WITH OR WITHOUT FEVER  NAUSEA AND VOMITING THAT IS NOT CONTROLLED WITH YOUR NAUSEA MEDICATION  *UNUSUAL SHORTNESS OF BREATH  *UNUSUAL BRUISING OR BLEEDING  TENDERNESS IN MOUTH AND THROAT WITH OR WITHOUT PRESENCE OF ULCERS  *URINARY PROBLEMS  *BOWEL PROBLEMS  UNUSUAL RASH Items with * indicate a potential emergency and should be followed up as soon as possible.  Feel free to call the clinic should you have any questions or concerns. The clinic phone number is [phone].  Please show the [place] at check-in to the Emergency Department and triage nurse.

## 2019-06-24 ENCOUNTER — Other Ambulatory Visit: Payer: Self-pay

## 2019-06-24 ENCOUNTER — Inpatient Hospital Stay: Payer: Medicare Other

## 2019-06-24 VITALS — BP 127/65 | HR 87 | Temp 97.8°F | Resp 18 | Ht 62.0 in | Wt 145.0 lb

## 2019-06-24 DIAGNOSIS — C50411 Malignant neoplasm of upper-outer quadrant of right female breast: Secondary | ICD-10-CM

## 2019-06-24 DIAGNOSIS — Z170 Estrogen receptor positive status [ER+]: Secondary | ICD-10-CM

## 2019-06-24 DIAGNOSIS — Z5112 Encounter for antineoplastic immunotherapy: Secondary | ICD-10-CM | POA: Diagnosis not present

## 2019-06-24 DIAGNOSIS — Z95828 Presence of other vascular implants and grafts: Secondary | ICD-10-CM

## 2019-06-24 LAB — CBC WITH DIFFERENTIAL (CANCER CENTER ONLY)
Abs Immature Granulocytes: 0.11 10*3/uL — ABNORMAL HIGH (ref 0.00–0.07)
Basophils Absolute: 0.1 10*3/uL (ref 0.0–0.1)
Basophils Relative: 1 %
Eosinophils Absolute: 0.3 10*3/uL (ref 0.0–0.5)
Eosinophils Relative: 4 %
HCT: 32.3 % — ABNORMAL LOW (ref 36.0–46.0)
Hemoglobin: 10.3 g/dL — ABNORMAL LOW (ref 12.0–15.0)
Immature Granulocytes: 1 %
Lymphocytes Relative: 42 %
Lymphs Abs: 3.9 10*3/uL (ref 0.7–4.0)
MCH: 29.9 pg (ref 26.0–34.0)
MCHC: 31.9 g/dL (ref 30.0–36.0)
MCV: 93.9 fL (ref 80.0–100.0)
Monocytes Absolute: 1.1 10*3/uL — ABNORMAL HIGH (ref 0.1–1.0)
Monocytes Relative: 12 %
Neutro Abs: 3.7 10*3/uL (ref 1.7–7.7)
Neutrophils Relative %: 40 %
Platelet Count: 529 10*3/uL — ABNORMAL HIGH (ref 150–400)
RBC: 3.44 MIL/uL — ABNORMAL LOW (ref 3.87–5.11)
RDW: 15 % (ref 11.5–15.5)
WBC Count: 9.2 10*3/uL (ref 4.0–10.5)
nRBC: 0 % (ref 0.0–0.2)

## 2019-06-24 LAB — CMP (CANCER CENTER ONLY)
ALT: 22 U/L (ref 0–44)
AST: 17 U/L (ref 15–41)
Albumin: 3.7 g/dL (ref 3.5–5.0)
Alkaline Phosphatase: 59 U/L (ref 38–126)
Anion gap: 10 (ref 5–15)
BUN: 16 mg/dL (ref 8–23)
CO2: 22 mmol/L (ref 22–32)
Calcium: 8.7 mg/dL — ABNORMAL LOW (ref 8.9–10.3)
Chloride: 109 mmol/L (ref 98–111)
Creatinine: 0.74 mg/dL (ref 0.44–1.00)
GFR, Est AFR Am: >60 mL/min (ref >60)
GFR, Estimated: >60 mL/min (ref >60)
Glucose, Bld: 143 mg/dL — ABNORMAL HIGH (ref 70–99)
Potassium: 3.8 mmol/L (ref 3.5–5.1)
Sodium: 141 mmol/L (ref 135–145)
Total Bilirubin: <0.2 mg/dL — ABNORMAL LOW (ref 0.3–1.2)
Total Protein: 6 g/dL — ABNORMAL LOW (ref 6.5–8.1)

## 2019-06-24 MED ORDER — ACETAMINOPHEN 325 MG PO TABS
650.0000 mg | ORAL_TABLET | Freq: Once | ORAL | Status: AC
  Administered 2019-06-24: 650 mg via ORAL

## 2019-06-24 MED ORDER — DEXAMETHASONE SODIUM PHOSPHATE 10 MG/ML IJ SOLN
INTRAMUSCULAR | Status: AC
  Filled 2019-06-24: qty 1

## 2019-06-24 MED ORDER — FAMOTIDINE IN NACL 20-0.9 MG/50ML-% IV SOLN
INTRAVENOUS | Status: AC
  Filled 2019-06-24: qty 50

## 2019-06-24 MED ORDER — SODIUM CHLORIDE 0.9 % IV SOLN
Freq: Once | INTRAVENOUS | Status: AC
  Filled 2019-06-24: qty 250

## 2019-06-24 MED ORDER — HEPARIN SOD (PORK) LOCK FLUSH 100 UNIT/ML IV SOLN
500.0000 [IU] | Freq: Once | INTRAVENOUS | Status: AC | PRN
  Administered 2019-06-24: 500 [IU]
  Filled 2019-06-24: qty 5

## 2019-06-24 MED ORDER — FAMOTIDINE IN NACL 20-0.9 MG/50ML-% IV SOLN
20.0000 mg | Freq: Once | INTRAVENOUS | Status: AC
  Administered 2019-06-24: 20 mg via INTRAVENOUS

## 2019-06-24 MED ORDER — SODIUM CHLORIDE 0.9% FLUSH
10.0000 mL | Freq: Once | INTRAVENOUS | Status: AC
  Administered 2019-06-24: 10 mL
  Filled 2019-06-24: qty 10

## 2019-06-24 MED ORDER — TRASTUZUMAB-ANNS CHEMO 150 MG IV SOLR: 150.0000 mg | Freq: Once | INTRAVENOUS | Status: DC

## 2019-06-24 MED ORDER — DIPHENHYDRAMINE HCL 50 MG/ML IJ SOLN
INTRAMUSCULAR | Status: AC
  Filled 2019-06-24: qty 1

## 2019-06-24 MED ORDER — TRASTUZUMAB-ANNS CHEMO 150 MG IV SOLR
2.0000 mg/kg | Freq: Once | INTRAVENOUS | Status: AC
  Administered 2019-06-24: 126 mg via INTRAVENOUS
  Filled 2019-06-24: qty 6

## 2019-06-24 MED ORDER — SODIUM CHLORIDE 0.9 % IV SOLN
80.0000 mg/m2 | Freq: Once | INTRAVENOUS | Status: AC
  Administered 2019-06-24: 138 mg via INTRAVENOUS
  Filled 2019-06-24: qty 23

## 2019-06-24 MED ORDER — DIPHENHYDRAMINE HCL 50 MG/ML IJ SOLN
25.0000 mg | Freq: Once | INTRAMUSCULAR | Status: AC
  Administered 2019-06-24: 25 mg via INTRAVENOUS

## 2019-06-24 MED ORDER — DEXAMETHASONE SODIUM PHOSPHATE 10 MG/ML IJ SOLN
10.0000 mg | Freq: Once | INTRAMUSCULAR | Status: AC
  Administered 2019-06-24: 10 mg via INTRAVENOUS

## 2019-06-24 MED ORDER — ACETAMINOPHEN 325 MG PO TABS
ORAL_TABLET | ORAL | Status: AC
  Filled 2019-06-24: qty 2

## 2019-06-24 MED ORDER — SODIUM CHLORIDE 0.9% FLUSH
10.0000 mL | INTRAVENOUS | Status: DC | PRN
  Administered 2019-06-24: 10 mL
  Filled 2019-06-24: qty 10

## 2019-06-24 NOTE — Patient Instructions (Signed)
[facility] Discharge Instructions for Patients Receiving Chemotherapy  Today you received the following chemotherapy agents Trastuzumab-anns and Taxol  To help prevent nausea and vomiting after your treatment, we encourage you to take your nausea medication as directed.    If you develop nausea and vomiting that is not controlled by your nausea medication, call the clinic.   BELOW ARE SYMPTOMS THAT SHOULD BE REPORTED IMMEDIATELY:  *FEVER GREATER THAN 100.5 F  *CHILLS WITH OR WITHOUT FEVER  NAUSEA AND VOMITING THAT IS NOT CONTROLLED WITH YOUR NAUSEA MEDICATION  *UNUSUAL SHORTNESS OF BREATH  *UNUSUAL BRUISING OR BLEEDING  TENDERNESS IN MOUTH AND THROAT WITH OR WITHOUT PRESENCE OF ULCERS  *URINARY PROBLEMS  *BOWEL PROBLEMS  UNUSUAL RASH Items with * indicate a potential emergency and should be followed up as soon as possible.  Feel free to call the clinic should you have any questions or concerns. The clinic phone number is [phone].  Please show the [facility] at check-in to the Emergency Department and triage nurse.

## 2019-06-24 NOTE — Patient Instructions (Signed)

## 2019-06-30 NOTE — Progress Notes (Signed)
Patient Care Team: [name], [place] as PCP - General (Internal Medicine) [name], [name], MD as PCP - Cardiology (Cardiology) [name], RN as Oncology Nurse Navigator [name], RN as Oncology Nurse Navigator  DIAGNOSIS:    ICD-10-CM   1. Malignant neoplasm of upper-outer quadrant of right breast in female, estrogen receptor positive ([place])  C50.411    Z17.0     SUMMARY OF ONCOLOGIC HISTORY: Oncology History  Malignant neoplasm of upper-outer quadrant of right breast in female, estrogen receptor positive ([place])  03/16/2019 Cancer Staging   Staging form: Breast, AJCC 8th Edition - Clinical stage from 03/16/2019: Stage IA (cT1c, cN0, cM0, G2, ER+, PR+, HER2+) - Signed by [name], NP on 03/25/2019   03/25/2019 Initial Diagnosis   Routine screening mammogram detected a 1.3cm mass in the right breast, 9 o'clock position, no axillary adenopathy. 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    04/15/2019 Surgery   Right lumpectomy [name]): IDC, grade 2, 1.3cm, with intermediate grade DCIS, clear margins, and 1 right axillary lymph node negative   04/15/2019 Cancer Staging   Staging form: Breast, AJCC 8th Edition - Pathologic stage from 04/15/2019: Stage IA (pT1c, pN0, cM0, G2, ER+, PR+, HER2+) - Signed by [name], NP on 04/29/2019   05/13/2019 -  Chemotherapy   Weekly Taxol and Herceptin x 12, then maintenance Herceptin every 3 weeks x 1 year     CHIEF COMPLIANT: Cycle8Taxol Herceptin  INTERVAL HISTORY: Margaret Norman is a 69 y.o. with above-mentioned history of right breast cancerwho underwent alumpectomy. She is currently on adjuvant chemotherapy with Taxol Herceptin.She presents to the clinic todayfor cycle 8 and a toxicity check.  ALLERGIES:  is allergic to invokana [canagliflozin] and amoxicillin.  MEDICATIONS:  Current Outpatient Medications  Medication Sig  Dispense Refill  . acetaminophen (TYLENOL) 500 MG tablet Take 1,000 mg by mouth every 6 (six) hours as needed for moderate pain.    Marland Kitchen aspirin 81 MG EC tablet Take 81 mg by mouth daily.      . Blood Glucose Monitoring Suppl (ONETOUCH VERIO FLEX SYSTEM) W/DEVICE KIT 1 kit by Does not apply route 3 (three) times daily. 1 kit 1  . buPROPion (WELLBUTRIN XL) 150 MG 24 hr tablet Take 1 tablet (150 mg total) by mouth every morning. 90 tablet 3  . Cyanocobalamin (VITAMIN B12 PO) Take 2,500 mcg by mouth daily.    . Dulaglutide (TRULICITY) 3 DQ/2.2WL SOPN Inject 3 mg into the skin once a week. 4 pen 2  . empagliflozin (JARDIANCE) 25 MG TABS tablet Take 25 mg by mouth daily. 30 tablet 11  . fluticasone (FLONASE) 50 MCG/ACT nasal spray Place 2 sprays into both nostrils daily. 16 g 6  . glucose blood (ONETOUCH VERIO) test strip Test three times daily as directed. DX: E11.9 300 each 3  . Insulin Pen Needle (B-D ULTRAFINE III SHORT PEN) 31G X 8 MM MISC USE DAILY WITH LANTUS  INSULIN 90 each 1  . LANTUS SOLOSTAR 100 UNIT/ML Solostar Pen INJECT SUBCUTANEOUSLY 30  UNITS AT BEDTIME 30 mL 3  . lidocaine-prilocaine (EMLA) cream Apply to affected area once 30 g 3  . metFORMIN (GLUCOPHAGE) 1000 MG tablet TAKE 1 TABLET BY MOUTH  TWICE DAILY WITH MEALS 180 tablet 3  . olmesartan (BENICAR) 40 MG tablet Take 1 tablet (40 mg total) by mouth daily. 90 tablet 3  . omeprazole (PRILOSEC) 40 MG capsule  TAKE 1 CAPSULE BY MOUTH  DAILY 90 capsule 3  . ondansetron (ZOFRAN) 8 MG tablet Take 1 tablet (8 mg total) by mouth 2 (two) times daily as needed (Nausea or vomiting). 30 tablet 1  . ONE TOUCH LANCETS MISC Test three times daily as directed. DX: E11.9 300 each 3  . Probiotic Product (PROBIOTIC ADVANCED PO) Take 1 tablet by mouth at bedtime.     . prochlorperazine (COMPAZINE) 10 MG tablet Take 1 tablet (10 mg total) by mouth every 6 (six) hours as needed (Nausea or vomiting). 30 tablet 1  . pyridOXINE (VITAMIN B-6) 100 MG tablet  Take 100 mg by mouth daily.     . rosuvastatin (CRESTOR) 10 MG tablet Take 1 tablet (10 mg total) by mouth daily. 90 tablet 3  . sertraline (ZOLOFT) 100 MG tablet Take 1 tablet (100 mg total) by mouth daily. 90 tablet 3  . Vitamin E 180 MG CAPS Take 180 mg by mouth daily.     No current facility-administered medications for this visit.    PHYSICAL EXAMINATION: ECOG PERFORMANCE STATUS: 1 - Symptomatic but completely ambulatory  Vitals:   07/01/19 0919  BP: (!) 145/70  Pulse: 87  Resp: 20  Temp: 98 F (36.7 C)  SpO2: 100%   Filed Weights   07/01/19 0919  Weight: 145 lb 9.6 oz (66 kg)    LABORATORY DATA:  I have reviewed the data as listed CMP Latest Ref Rng & Units 06/24/2019 06/17/2019 06/10/2019  Glucose 70 - 99 mg/dL 143(H) 177(H) 163(H)  BUN 8 - 23 mg/dL _0 Creatinine 0.44 - 1.00 mg/dL 0.74 0.78 0.78  Sodium 135 - 145 mmol/L 141 143 142  Potassium 3.5 - 5.1 mmol/L 3.8 4.4 4.3  Chloride 98 - 111 mmol/L 109 109 108  CO2 22 - 32 mmol/L 22 23 21(L)  Calcium 8.9 - 10.3 mg/dL 8.7(L) 9.0 9.5  Total Protein 6.5 - 8.1 g/dL 6.0(L) 6.6 6.7  Total Bilirubin 0.3 - 1.2 mg/dL <0.2(L) 0.2(L) 0.3  Alkaline Phos 38 - 126 U/L 59 60 60  AST 15 - 41 U/L _1 ALT 0 - 44 U/L _2 Lab Results  Component Value Date   WBC 10.2 07/01/2019   HGB 10.3 (L) 07/01/2019   HCT 31.8 (L) 07/01/2019   MCV 93.8 07/01/2019   PLT 509 (H) 07/01/2019   NEUTROABS 4.9 07/01/2019    ASSESSMENT & PLAN:  Malignant neoplasm of upper-outer quadrant of right breast in female, estrogen receptor positive ([place]) Routine screening mammogram detected a 1.3cm mass in the right breast, 9 o'clock position, no axillary adenopathy. 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 T1c N0 stage Ia  Recommendations: 1. 04/15/19: Breast conserving surgery: Right lumpectomy [name]): IDC, grade 2, 1.3cm, with intermediate grade DCIS, clear margins, and 1  right axillary lymph node negative 2.Adj chemo with Taxol-Herceptin foll by Herceptin maintenance for 1 yearstarted 05/13/2019 3. Adjuvant radiation therapy followed by 4. Adjuvant antiestrogen therapy  Enrolled inSWOG 1714 trials.  ------------------------------------------------------------------------------------------------------------------------- Current treatment: Cycle8Taxol Herceptin EchoEF 55 to 60% 04/15/2019  Chemo toxicities: 1.Diarrhea: controlled with imodium, being monitored 2.Mild nausea: Stable 3.Decreased appetite: Improving 4. Grade 1 intermittent peripheral neuroapthy, watchful monitoring.  Return to clinic weekly for chemo and every other week for follow-up with me.    No orders of the defined types were placed in this encounter.  The patient has a good understanding  of the overall plan. she agrees with it. she will call with any problems that may develop before the next visit here.  Total time spent: 30 mins including face to face time and time spent for planning, charting and coordination of care  Nicholas Lose, MD 07/01/2019  I, Cloyde Reams Dorshimer, am acting as scribe for Dr. Nicholas Lose.  I have reviewed the above documentation for accuracy and completeness, and I agree with the above.

## 2019-07-01 ENCOUNTER — Other Ambulatory Visit: Payer: Self-pay

## 2019-07-01 ENCOUNTER — Inpatient Hospital Stay: Payer: Medicare Other

## 2019-07-01 ENCOUNTER — Inpatient Hospital Stay (HOSPITAL_BASED_OUTPATIENT_CLINIC_OR_DEPARTMENT_OTHER): Payer: Medicare Other | Admitting: Hematology and Oncology

## 2019-07-01 ENCOUNTER — Inpatient Hospital Stay: Payer: Medicare Other | Attending: Hematology and Oncology

## 2019-07-01 ENCOUNTER — Encounter: Payer: Self-pay | Admitting: *Deleted

## 2019-07-01 DIAGNOSIS — C50411 Malignant neoplasm of upper-outer quadrant of right female breast: Secondary | ICD-10-CM

## 2019-07-01 DIAGNOSIS — Z95828 Presence of other vascular implants and grafts: Secondary | ICD-10-CM

## 2019-07-01 DIAGNOSIS — Z006 Encounter for examination for normal comparison and control in clinical research program: Secondary | ICD-10-CM | POA: Diagnosis present

## 2019-07-01 DIAGNOSIS — Z170 Estrogen receptor positive status [ER+]: Secondary | ICD-10-CM | POA: Insufficient documentation

## 2019-07-01 DIAGNOSIS — Z5111 Encounter for antineoplastic chemotherapy: Secondary | ICD-10-CM | POA: Insufficient documentation

## 2019-07-01 DIAGNOSIS — Z5112 Encounter for antineoplastic immunotherapy: Secondary | ICD-10-CM | POA: Diagnosis not present

## 2019-07-01 DIAGNOSIS — G620 Drug-induced polyneuropathy: Secondary | ICD-10-CM | POA: Diagnosis not present

## 2019-07-01 LAB — CBC WITH DIFFERENTIAL (CANCER CENTER ONLY)
Abs Immature Granulocytes: 0.14 10*3/uL — ABNORMAL HIGH (ref 0.00–0.07)
Basophils Absolute: 0.1 10*3/uL (ref 0.0–0.1)
Basophils Relative: 1 %
Eosinophils Absolute: 0.3 10*3/uL (ref 0.0–0.5)
Eosinophils Relative: 3 %
HCT: 31.8 % — ABNORMAL LOW (ref 36.0–46.0)
Hemoglobin: 10.3 g/dL — ABNORMAL LOW (ref 12.0–15.0)
Immature Granulocytes: 1 %
Lymphocytes Relative: 37 %
Lymphs Abs: 3.8 10*3/uL (ref 0.7–4.0)
MCH: 30.4 pg (ref 26.0–34.0)
MCHC: 32.4 g/dL (ref 30.0–36.0)
MCV: 93.8 fL (ref 80.0–100.0)
Monocytes Absolute: 1 10*3/uL (ref 0.1–1.0)
Monocytes Relative: 10 %
Neutro Abs: 4.9 10*3/uL (ref 1.7–7.7)
Neutrophils Relative %: 48 %
Platelet Count: 509 10*3/uL — ABNORMAL HIGH (ref 150–400)
RBC: 3.39 MIL/uL — ABNORMAL LOW (ref 3.87–5.11)
RDW: 15.5 % (ref 11.5–15.5)
WBC Count: 10.2 10*3/uL (ref 4.0–10.5)
nRBC: 0.3 % — ABNORMAL HIGH (ref 0.0–0.2)

## 2019-07-01 LAB — CMP (CANCER CENTER ONLY)
ALT: 28 U/L (ref 0–44)
AST: 19 U/L (ref 15–41)
Albumin: 3.6 g/dL (ref 3.5–5.0)
Alkaline Phosphatase: 68 U/L (ref 38–126)
Anion gap: 10 (ref 5–15)
BUN: 14 mg/dL (ref 8–23)
CO2: 22 mmol/L (ref 22–32)
Calcium: 8.5 mg/dL — ABNORMAL LOW (ref 8.9–10.3)
Chloride: 110 mmol/L (ref 98–111)
Creatinine: 0.74 mg/dL (ref 0.44–1.00)
GFR, Est AFR Am: >60 mL/min (ref >60)
GFR, Estimated: >60 mL/min (ref >60)
Glucose, Bld: 156 mg/dL — ABNORMAL HIGH (ref 70–99)
Potassium: 3.8 mmol/L (ref 3.5–5.1)
Sodium: 142 mmol/L (ref 135–145)
Total Bilirubin: 0.2 mg/dL — ABNORMAL LOW (ref 0.3–1.2)
Total Protein: 5.9 g/dL — ABNORMAL LOW (ref 6.5–8.1)

## 2019-07-01 MED ORDER — FAMOTIDINE IN NACL 20-0.9 MG/50ML-% IV SOLN
20.0000 mg | Freq: Once | INTRAVENOUS | Status: AC
  Administered 2019-07-01: 20 mg via INTRAVENOUS

## 2019-07-01 MED ORDER — SODIUM CHLORIDE 0.9% FLUSH
10.0000 mL | INTRAVENOUS | Status: DC | PRN
  Administered 2019-07-01: 13:00:00 10 mL
  Filled 2019-07-01: qty 10

## 2019-07-01 MED ORDER — ACETAMINOPHEN 325 MG PO TABS
ORAL_TABLET | ORAL | Status: AC
  Filled 2019-07-01: qty 2

## 2019-07-01 MED ORDER — FAMOTIDINE IN NACL 20-0.9 MG/50ML-% IV SOLN
INTRAVENOUS | Status: AC
  Filled 2019-07-01: qty 50

## 2019-07-01 MED ORDER — DIPHENHYDRAMINE HCL 50 MG/ML IJ SOLN
25.0000 mg | Freq: Once | INTRAMUSCULAR | Status: AC
  Administered 2019-07-01: 25 mg via INTRAVENOUS

## 2019-07-01 MED ORDER — SODIUM CHLORIDE 0.9% FLUSH
10.0000 mL | Freq: Once | INTRAVENOUS | Status: AC
  Administered 2019-07-01: 10 mL
  Filled 2019-07-01: qty 10

## 2019-07-01 MED ORDER — HEPARIN SOD (PORK) LOCK FLUSH 100 UNIT/ML IV SOLN
500.0000 [IU] | Freq: Once | INTRAVENOUS | Status: AC | PRN
  Administered 2019-07-01: 13:00:00 500 [IU]
  Filled 2019-07-01: qty 5

## 2019-07-01 MED ORDER — DEXAMETHASONE SODIUM PHOSPHATE 10 MG/ML IJ SOLN
10.0000 mg | Freq: Once | INTRAMUSCULAR | Status: AC
  Administered 2019-07-01: 10:00:00 10 mg via INTRAVENOUS

## 2019-07-01 MED ORDER — SODIUM CHLORIDE 0.9 % IV SOLN
Freq: Once | INTRAVENOUS | Status: AC
  Filled 2019-07-01: qty 250

## 2019-07-01 MED ORDER — SODIUM CHLORIDE 0.9 % IV SOLN
80.0000 mg/m2 | Freq: Once | INTRAVENOUS | Status: AC
  Administered 2019-07-01: 12:00:00 138 mg via INTRAVENOUS
  Filled 2019-07-01: qty 23

## 2019-07-01 MED ORDER — TRASTUZUMAB-ANNS CHEMO 150 MG IV SOLR
138.0000 mg | Freq: Once | INTRAVENOUS | Status: AC
  Administered 2019-07-01: 11:00:00 138 mg via INTRAVENOUS
  Filled 2019-07-01: qty 6.57

## 2019-07-01 MED ORDER — DIPHENHYDRAMINE HCL 50 MG/ML IJ SOLN
INTRAMUSCULAR | Status: AC
  Filled 2019-07-01: qty 1

## 2019-07-01 MED ORDER — DEXAMETHASONE SODIUM PHOSPHATE 10 MG/ML IJ SOLN
INTRAMUSCULAR | Status: AC
  Filled 2019-07-01: qty 1

## 2019-07-01 MED ORDER — ACETAMINOPHEN 325 MG PO TABS
650.0000 mg | ORAL_TABLET | Freq: Once | ORAL | Status: AC
  Administered 2019-07-01: 10:00:00 650 mg via ORAL

## 2019-07-01 NOTE — Patient Instructions (Signed)
[facility] Discharge Instructions for Patients Receiving Chemotherapy  Today you received the following chemotherapy agents: Taxol/Kanjinti  To help prevent nausea and vomiting after your treatment, we encourage you to take your nausea medication as directed.   If you develop nausea and vomiting that is not controlled by your nausea medication, call the clinic.   BELOW ARE SYMPTOMS THAT SHOULD BE REPORTED IMMEDIATELY:  *FEVER GREATER THAN 100.5 F  *CHILLS WITH OR WITHOUT FEVER  NAUSEA AND VOMITING THAT IS NOT CONTROLLED WITH YOUR NAUSEA MEDICATION  *UNUSUAL SHORTNESS OF BREATH  *UNUSUAL BRUISING OR BLEEDING  TENDERNESS IN MOUTH AND THROAT WITH OR WITHOUT PRESENCE OF ULCERS  *URINARY PROBLEMS  *BOWEL PROBLEMS  UNUSUAL RASH Items with * indicate a potential emergency and should be followed up as soon as possible.  Feel free to call the clinic should you have any questions or concerns. The clinic phone number is [phone].  Please show the [place] at check-in to the Emergency Department and triage nurse.

## 2019-07-01 NOTE — Assessment & Plan Note (Signed)
Routine screening mammogram detected a 1.3cm mass in the right breast, 9 o'clock position, no axillary adenopathy. 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 T1c N0 stage Ia  Recommendations: 1. 04/15/19: Breast conserving surgery: Right lumpectomy [name]): IDC, grade 2, 1.3cm, with intermediate grade DCIS, clear margins, and 1 right axillary lymph node negative 2.Adj chemo with Taxol-Herceptin foll by Herceptin maintenance for 1 yearstarted 05/13/2019 3. Adjuvant radiation therapy followed by 4. Adjuvant antiestrogen therapy  Enrolled inSWOG 1714 trials.  ------------------------------------------------------------------------------------------------------------------------- Current treatment: Cycle8Taxol Herceptin EchoEF 55 to 60% 04/15/2019  Chemo toxicities: 1.Diarrhea: controlled with imodium, being monitored 2.Mild nausea: Stable 3.Decreased appetite: Improving 4. Grade 1 intermittent peripheral neuroapthy, watchful monitoring.  Return to clinic weekly for chemo and every other week for follow-up with me.

## 2019-07-01 NOTE — Patient Instructions (Signed)

## 2019-07-08 ENCOUNTER — Inpatient Hospital Stay: Payer: Medicare Other

## 2019-07-08 ENCOUNTER — Other Ambulatory Visit: Payer: Self-pay

## 2019-07-08 VITALS — BP 136/64 | HR 97 | Temp 97.8°F | Resp 16 | Ht 62.0 in | Wt 143.5 lb

## 2019-07-08 DIAGNOSIS — C50411 Malignant neoplasm of upper-outer quadrant of right female breast: Secondary | ICD-10-CM

## 2019-07-08 DIAGNOSIS — Z95828 Presence of other vascular implants and grafts: Secondary | ICD-10-CM

## 2019-07-08 DIAGNOSIS — Z170 Estrogen receptor positive status [ER+]: Secondary | ICD-10-CM

## 2019-07-08 DIAGNOSIS — Z5112 Encounter for antineoplastic immunotherapy: Secondary | ICD-10-CM | POA: Diagnosis not present

## 2019-07-08 LAB — CBC WITH DIFFERENTIAL (CANCER CENTER ONLY)
Abs Immature Granulocytes: 0.19 10*3/uL — ABNORMAL HIGH (ref 0.00–0.07)
Basophils Absolute: 0.1 10*3/uL (ref 0.0–0.1)
Basophils Relative: 1 %
Eosinophils Absolute: 0.4 10*3/uL (ref 0.0–0.5)
Eosinophils Relative: 3 %
HCT: 34.4 % — ABNORMAL LOW (ref 36.0–46.0)
Hemoglobin: 11.1 g/dL — ABNORMAL LOW (ref 12.0–15.0)
Immature Granulocytes: 1 %
Lymphocytes Relative: 41 %
Lymphs Abs: 5.5 10*3/uL — ABNORMAL HIGH (ref 0.7–4.0)
MCH: 30.2 pg (ref 26.0–34.0)
MCHC: 32.3 g/dL (ref 30.0–36.0)
MCV: 93.7 fL (ref 80.0–100.0)
Monocytes Absolute: 1.1 10*3/uL — ABNORMAL HIGH (ref 0.1–1.0)
Monocytes Relative: 8 %
Neutro Abs: 6.2 10*3/uL (ref 1.7–7.7)
Neutrophils Relative %: 46 %
Platelet Count: 565 10*3/uL — ABNORMAL HIGH (ref 150–400)
RBC: 3.67 MIL/uL — ABNORMAL LOW (ref 3.87–5.11)
RDW: 15.6 % — ABNORMAL HIGH (ref 11.5–15.5)
Smear Review: 2
WBC Count: 13.5 10*3/uL — ABNORMAL HIGH (ref 4.0–10.5)
nRBC: 0.1 % (ref 0.0–0.2)

## 2019-07-08 LAB — CMP (CANCER CENTER ONLY)
ALT: 31 U/L (ref 0–44)
AST: 23 U/L (ref 15–41)
Albumin: 3.9 g/dL (ref 3.5–5.0)
Alkaline Phosphatase: 72 U/L (ref 38–126)
Anion gap: 13 (ref 5–15)
BUN: 12 mg/dL (ref 8–23)
CO2: 19 mmol/L — ABNORMAL LOW (ref 22–32)
Calcium: 8.7 mg/dL — ABNORMAL LOW (ref 8.9–10.3)
Chloride: 109 mmol/L (ref 98–111)
Creatinine: 0.84 mg/dL (ref 0.44–1.00)
GFR, Est AFR Am: >60 mL/min (ref >60)
GFR, Estimated: >60 mL/min (ref >60)
Glucose, Bld: 210 mg/dL — ABNORMAL HIGH (ref 70–99)
Potassium: 3.4 mmol/L — ABNORMAL LOW (ref 3.5–5.1)
Sodium: 141 mmol/L (ref 135–145)
Total Bilirubin: 0.2 mg/dL — ABNORMAL LOW (ref 0.3–1.2)
Total Protein: 6.3 g/dL — ABNORMAL LOW (ref 6.5–8.1)

## 2019-07-08 MED ORDER — DIPHENHYDRAMINE HCL 50 MG/ML IJ SOLN
25.0000 mg | Freq: Once | INTRAMUSCULAR | Status: AC
  Administered 2019-07-08: 25 mg via INTRAVENOUS

## 2019-07-08 MED ORDER — ACETAMINOPHEN 325 MG PO TABS
ORAL_TABLET | ORAL | Status: AC
  Filled 2019-07-08: qty 2

## 2019-07-08 MED ORDER — SODIUM CHLORIDE 0.9 % IV SOLN
Freq: Once | INTRAVENOUS | Status: AC
  Filled 2019-07-08: qty 250

## 2019-07-08 MED ORDER — DIPHENHYDRAMINE HCL 50 MG/ML IJ SOLN
INTRAMUSCULAR | Status: AC
  Filled 2019-07-08: qty 1

## 2019-07-08 MED ORDER — HEPARIN SOD (PORK) LOCK FLUSH 100 UNIT/ML IV SOLN
500.0000 [IU] | Freq: Once | INTRAVENOUS | Status: AC | PRN
  Administered 2019-07-08: 12:00:00 500 [IU]
  Filled 2019-07-08: qty 5

## 2019-07-08 MED ORDER — SODIUM CHLORIDE 0.9% FLUSH
10.0000 mL | INTRAVENOUS | Status: DC | PRN
  Administered 2019-07-08: 12:00:00 10 mL
  Filled 2019-07-08: qty 10

## 2019-07-08 MED ORDER — SODIUM CHLORIDE 0.9% FLUSH
10.0000 mL | Freq: Once | INTRAVENOUS | Status: AC
  Administered 2019-07-08: 08:00:00 10 mL
  Filled 2019-07-08: qty 10

## 2019-07-08 MED ORDER — DEXAMETHASONE SODIUM PHOSPHATE 10 MG/ML IJ SOLN
10.0000 mg | Freq: Once | INTRAMUSCULAR | Status: AC
  Administered 2019-07-08: 10:00:00 10 mg via INTRAVENOUS

## 2019-07-08 MED ORDER — TRASTUZUMAB-ANNS CHEMO 150 MG IV SOLR
2.0000 mg/kg | Freq: Once | INTRAVENOUS | Status: AC
  Administered 2019-07-08: 11:00:00 126 mg via INTRAVENOUS
  Filled 2019-07-08: qty 6

## 2019-07-08 MED ORDER — FAMOTIDINE IN NACL 20-0.9 MG/50ML-% IV SOLN
INTRAVENOUS | Status: AC
  Filled 2019-07-08: qty 50

## 2019-07-08 MED ORDER — ACETAMINOPHEN 325 MG PO TABS
650.0000 mg | ORAL_TABLET | Freq: Once | ORAL | Status: AC
  Administered 2019-07-08: 650 mg via ORAL

## 2019-07-08 MED ORDER — FAMOTIDINE IN NACL 20-0.9 MG/50ML-% IV SOLN
20.0000 mg | Freq: Once | INTRAVENOUS | Status: AC
  Administered 2019-07-08: 10:00:00 20 mg via INTRAVENOUS

## 2019-07-08 MED ORDER — SODIUM CHLORIDE 0.9 % IV SOLN
80.0000 mg/m2 | Freq: Once | INTRAVENOUS | Status: AC
  Administered 2019-07-08: 11:00:00 138 mg via INTRAVENOUS
  Filled 2019-07-08: qty 23

## 2019-07-08 MED ORDER — DEXAMETHASONE SODIUM PHOSPHATE 10 MG/ML IJ SOLN
INTRAMUSCULAR | Status: AC
  Filled 2019-07-08: qty 1

## 2019-07-08 NOTE — Patient Instructions (Signed)
[facility] Discharge Instructions for Patients Receiving Chemotherapy  Today you received the following chemotherapy agents: Trastuzumab, Taxol  To help prevent nausea and vomiting after your treatment, we encourage you to take your nausea medication as directed.   If you develop nausea and vomiting that is not controlled by your nausea medication, call the clinic.   BELOW ARE SYMPTOMS THAT SHOULD BE REPORTED IMMEDIATELY:  *FEVER GREATER THAN 100.5 F  *CHILLS WITH OR WITHOUT FEVER  NAUSEA AND VOMITING THAT IS NOT CONTROLLED WITH YOUR NAUSEA MEDICATION  *UNUSUAL SHORTNESS OF BREATH  *UNUSUAL BRUISING OR BLEEDING  TENDERNESS IN MOUTH AND THROAT WITH OR WITHOUT PRESENCE OF ULCERS  *URINARY PROBLEMS  *BOWEL PROBLEMS  UNUSUAL RASH Items with * indicate a potential emergency and should be followed up as soon as possible.  Feel free to call the clinic should you have any questions or concerns. The clinic phone number is [phone].  Please show the CHEMO ALERT CARD at check-in to the Emergency Department and triage nurse.   

## 2019-07-15 ENCOUNTER — Inpatient Hospital Stay: Payer: Medicare Other

## 2019-07-15 ENCOUNTER — Encounter: Payer: Self-pay | Admitting: *Deleted

## 2019-07-15 ENCOUNTER — Other Ambulatory Visit: Payer: Self-pay

## 2019-07-15 ENCOUNTER — Encounter: Payer: Self-pay | Admitting: Adult Health

## 2019-07-15 ENCOUNTER — Inpatient Hospital Stay (HOSPITAL_BASED_OUTPATIENT_CLINIC_OR_DEPARTMENT_OTHER): Payer: Medicare Other | Admitting: Adult Health

## 2019-07-15 VITALS — BP 112/70 | HR 113 | Temp 98.7°F | Resp 18 | Ht 62.0 in | Wt 141.1 lb

## 2019-07-15 VITALS — HR 100

## 2019-07-15 DIAGNOSIS — C50411 Malignant neoplasm of upper-outer quadrant of right female breast: Secondary | ICD-10-CM | POA: Diagnosis present

## 2019-07-15 DIAGNOSIS — Z170 Estrogen receptor positive status [ER+]: Secondary | ICD-10-CM

## 2019-07-15 DIAGNOSIS — Z95828 Presence of other vascular implants and grafts: Secondary | ICD-10-CM

## 2019-07-15 DIAGNOSIS — Z5111 Encounter for antineoplastic chemotherapy: Secondary | ICD-10-CM | POA: Diagnosis not present

## 2019-07-15 DIAGNOSIS — Z006 Encounter for examination for normal comparison and control in clinical research program: Secondary | ICD-10-CM

## 2019-07-15 DIAGNOSIS — G620 Drug-induced polyneuropathy: Secondary | ICD-10-CM | POA: Diagnosis not present

## 2019-07-15 DIAGNOSIS — Z5112 Encounter for antineoplastic immunotherapy: Secondary | ICD-10-CM | POA: Diagnosis not present

## 2019-07-15 LAB — CBC WITH DIFFERENTIAL (CANCER CENTER ONLY)
Abs Immature Granulocytes: 0.16 10*3/uL — ABNORMAL HIGH (ref 0.00–0.07)
Basophils Absolute: 0.1 10*3/uL (ref 0.0–0.1)
Basophils Relative: 1 %
Eosinophils Absolute: 0.6 10*3/uL — ABNORMAL HIGH (ref 0.0–0.5)
Eosinophils Relative: 4 %
HCT: 35.3 % — ABNORMAL LOW (ref 36.0–46.0)
Hemoglobin: 11.2 g/dL — ABNORMAL LOW (ref 12.0–15.0)
Immature Granulocytes: 1 %
Lymphocytes Relative: 37 %
Lymphs Abs: 5.2 10*3/uL — ABNORMAL HIGH (ref 0.7–4.0)
MCH: 30.1 pg (ref 26.0–34.0)
MCHC: 31.7 g/dL (ref 30.0–36.0)
MCV: 94.9 fL (ref 80.0–100.0)
Monocytes Absolute: 1.3 10*3/uL — ABNORMAL HIGH (ref 0.1–1.0)
Monocytes Relative: 10 %
Neutro Abs: 6.6 10*3/uL (ref 1.7–7.7)
Neutrophils Relative %: 47 %
Platelet Count: 576 10*3/uL — ABNORMAL HIGH (ref 150–400)
RBC: 3.72 MIL/uL — ABNORMAL LOW (ref 3.87–5.11)
RDW: 16.2 % — ABNORMAL HIGH (ref 11.5–15.5)
WBC Count: 13.9 10*3/uL — ABNORMAL HIGH (ref 4.0–10.5)
nRBC: 0 % (ref 0.0–0.2)

## 2019-07-15 LAB — CMP (CANCER CENTER ONLY)
ALT: 28 U/L (ref 0–44)
AST: 18 U/L (ref 15–41)
Albumin: 3.7 g/dL (ref 3.5–5.0)
Alkaline Phosphatase: 66 U/L (ref 38–126)
Anion gap: 12 (ref 5–15)
BUN: 11 mg/dL (ref 8–23)
CO2: 24 mmol/L (ref 22–32)
Calcium: 9.2 mg/dL (ref 8.9–10.3)
Chloride: 107 mmol/L (ref 98–111)
Creatinine: 0.76 mg/dL (ref 0.44–1.00)
GFR, Est AFR Am: >60 mL/min (ref >60)
GFR, Estimated: >60 mL/min (ref >60)
Glucose, Bld: 122 mg/dL — ABNORMAL HIGH (ref 70–99)
Potassium: 4.2 mmol/L (ref 3.5–5.1)
Sodium: 143 mmol/L (ref 135–145)
Total Bilirubin: 0.2 mg/dL — ABNORMAL LOW (ref 0.3–1.2)
Total Protein: 6.3 g/dL — ABNORMAL LOW (ref 6.5–8.1)

## 2019-07-15 LAB — RESEARCH LABS

## 2019-07-15 MED ORDER — ACETAMINOPHEN 325 MG PO TABS
ORAL_TABLET | ORAL | Status: AC
  Filled 2019-07-15: qty 2

## 2019-07-15 MED ORDER — DIPHENHYDRAMINE HCL 50 MG/ML IJ SOLN
INTRAMUSCULAR | Status: AC
  Filled 2019-07-15: qty 1

## 2019-07-15 MED ORDER — FAMOTIDINE IN NACL 20-0.9 MG/50ML-% IV SOLN
20.0000 mg | Freq: Once | INTRAVENOUS | Status: AC
  Administered 2019-07-15: 20 mg via INTRAVENOUS

## 2019-07-15 MED ORDER — DEXAMETHASONE SODIUM PHOSPHATE 10 MG/ML IJ SOLN
10.0000 mg | Freq: Once | INTRAMUSCULAR | Status: AC
  Administered 2019-07-15: 10 mg via INTRAVENOUS

## 2019-07-15 MED ORDER — FAMOTIDINE IN NACL 20-0.9 MG/50ML-% IV SOLN
INTRAVENOUS | Status: AC
  Filled 2019-07-15: qty 50

## 2019-07-15 MED ORDER — SODIUM CHLORIDE 0.9 % IV SOLN
80.0000 mg/m2 | Freq: Once | INTRAVENOUS | Status: AC
  Administered 2019-07-15: 138 mg via INTRAVENOUS
  Filled 2019-07-15: qty 23

## 2019-07-15 MED ORDER — DIPHENHYDRAMINE HCL 25 MG PO CAPS
ORAL_CAPSULE | ORAL | Status: AC
  Filled 2019-07-15: qty 1

## 2019-07-15 MED ORDER — SODIUM CHLORIDE 0.9% FLUSH
10.0000 mL | Freq: Once | INTRAVENOUS | Status: AC
  Administered 2019-07-15: 09:00:00 10 mL
  Filled 2019-07-15: qty 10

## 2019-07-15 MED ORDER — SODIUM CHLORIDE 0.9% FLUSH
10.0000 mL | INTRAVENOUS | Status: DC | PRN
  Administered 2019-07-15: 10 mL
  Filled 2019-07-15: qty 10

## 2019-07-15 MED ORDER — HEPARIN SOD (PORK) LOCK FLUSH 100 UNIT/ML IV SOLN
500.0000 [IU] | Freq: Once | INTRAVENOUS | Status: AC | PRN
  Administered 2019-07-15: 13:00:00 500 [IU]
  Filled 2019-07-15: qty 5

## 2019-07-15 MED ORDER — DEXAMETHASONE SODIUM PHOSPHATE 10 MG/ML IJ SOLN
INTRAMUSCULAR | Status: AC
  Filled 2019-07-15: qty 1

## 2019-07-15 MED ORDER — SODIUM CHLORIDE 0.9 % IV SOLN
Freq: Once | INTRAVENOUS | Status: AC
  Filled 2019-07-15: qty 250

## 2019-07-15 MED ORDER — TRASTUZUMAB-ANNS CHEMO 150 MG IV SOLR
2.0000 mg/kg | Freq: Once | INTRAVENOUS | Status: AC
  Administered 2019-07-15: 11:00:00 126 mg via INTRAVENOUS
  Filled 2019-07-15: qty 6

## 2019-07-15 MED ORDER — DIPHENHYDRAMINE HCL 50 MG/ML IJ SOLN
25.0000 mg | Freq: Once | INTRAMUSCULAR | Status: AC
  Administered 2019-07-15: 10:00:00 25 mg via INTRAVENOUS

## 2019-07-15 MED ORDER — ACETAMINOPHEN 325 MG PO TABS
650.0000 mg | ORAL_TABLET | Freq: Once | ORAL | Status: AC
  Administered 2019-07-15: 10:00:00 650 mg via ORAL

## 2019-07-15 NOTE — Research (Signed)
[id number], A PROSPECTIVE OBSERVATIONAL COHORT STUDY TO DEVELOP A PREDICTIVE MODEL OF TAXANE-INDUCED PERIPHERAL NEUROPATHY IN CANCER PATIENTS. 8 WEEKS VISIT:  Patient came into clinic by herself today for her scheduled visits.  PROs; Questionnaires were given to patient to complete in clinic prior to lab appointment. Collected questionnaires and checked for completeness and accuracy.  Labs; Optional research labs were collected per protocol.  History of Falls;  Not required this visit.  Solicited Neuropathy Events; Patient reports mild feelings of intermittent numbness and tingling in fingertips and toes.  Grade 1 Paresthesia reported on CRF by Wilber Bihari, NP and Dr. Lindi Adie agrees and states this is "possibly" related to Taxol.  Treatment;  Weekly paclitaxel treatment #10 planned for today with no delays or dose modifications since starting paclitaxel.  Assessment for Interventions for CIPN; Reviewed with patient and CRFs completed. Patient is not using any interventions for neuropathy. She is wearing knee high compression socks today hoping it will help alleviate restless legs she feels after getting IV benadryl pre-med. Patient reports taking anti-depressants daily (Zoloft and Wellbutrin XL) and acetaminophen 500 mg about twice a week for back pain.  She also takes acetaminophen 650mg  weekly for her pre-meds, not for any neuropathy symptoms. Patient denies any changes in her vitamin/supplements since her baseline visit.  Neuropen Assessment; Completed per protocol by this certified research RN with assistance of Otilio Miu, RN for recording.  T[place] Assessment; Completed per protocol by this certified research RN with assistance of Otilio Miu, RN for timing and recording.  Timed Get Up and Go Test; Not required this visit.   Physician Assessments; Treatment [place] form completed by Wilber Bihari, NP and Dr. Lindi Adie agrees with assessment.  Plan; Informed patient of next study assessments for  week 12 due on 08/05/19. Patient states her last paclitaxel treatment is 07/29/19.  Informed patient that research nurse will likely schedule the 12 weeks visit for 07/29/19 to occur same day as taxane treatment.  Patient verbalized understanding.  Thanked patient for participating in this study today. Encouraged her to call clinic if any questions or concerns prior to next appointment. She verbalized understanding.  Foye Spurling, BSN, RN Clinical Research Nurse 07/15/2019 10:19 AM

## 2019-07-15 NOTE — Progress Notes (Signed)
[facility] Follow up:    Margaret Norman, [place] [id number]   DIAGNOSIS: Cancer Staging Malignant neoplasm of upper-outer quadrant of right breast in female, estrogen receptor positive ([place]) Staging form: Breast, AJCC 8th Edition - Clinical stage from 03/16/2019: Stage IA (cT1c, cN0, cM0, G2, ER+, PR+, HER2+) - Signed by [name], NP on 03/25/2019 - Pathologic stage from 04/15/2019: Stage IA (pT1c, pN0, cM0, G2, ER+, PR+, HER2+) - Signed by [name], NP on 04/29/2019   SUMMARY OF ONCOLOGIC HISTORY: Oncology History  Malignant neoplasm of upper-outer quadrant of right breast in female, estrogen receptor positive ([place])  03/16/2019 Cancer Staging   Staging form: Breast, AJCC 8th Edition - Clinical stage from 03/16/2019: Stage IA (cT1c, cN0, cM0, G2, ER+, PR+, HER2+) - Signed by [name], NP on 03/25/2019   03/25/2019 Initial Diagnosis   Routine screening mammogram detected a 1.3cm mass in the right breast, 9 o'clock position, no axillary adenopathy. 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    04/15/2019 Surgery   Right lumpectomy Margaret Norman): IDC, grade 2, 1.3cm, with intermediate grade DCIS, clear margins, and 1 right axillary lymph node negative   04/15/2019 Cancer Staging   Staging form: Breast, AJCC 8th Edition - Pathologic stage from 04/15/2019: Stage IA (pT1c, pN0, cM0, G2, ER+, PR+, HER2+) - Signed by [name], NP on 04/29/2019   05/13/2019 -  Chemotherapy   Weekly Taxol and Herceptin x 12, then maintenance Herceptin every 3 weeks x 1 year     CURRENT THERAPY: week 10 Taxol/Herceptin  INTERVAL HISTORY: Margaret Norman 69 y.o. female returns for evaluation prior to her breast cancer treatment.  She is being seen in [place] study as well.  She has mild paresthesia, that she calls an intermittent mild numbness.  She  says it is unchanged.  She also notes some blurry vision and is planning on seeing her eye doctor tomorrow. She is otherwise feeling well.   Patient Active Problem List   Diagnosis Date Noted  . Port-A-Cath in place 06/10/2019  . Breast cancer ([place]) 04/15/2019  . Second degree AV block   . Malignant neoplasm of upper-outer quadrant of right breast in female, estrogen receptor positive ([place]) 03/25/2019  . Thyromegaly 01/10/2018  . Overweight (BMI 25.0-29.9) 08/13/2017  . Upper respiratory tract infection 07/30/2017  . Type 2 diabetes mellitus with hyperglycemia, with long-term current use of insulin ([place]) 03/19/2017  . Thiamine deficiency 01/04/2017  . 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 Deficiency anemia 04/30/2016  . Routine general medical examination at a health care facility 04/27/2015  . Abdominal wall hernia 04/26/2015  . Family history of hemochromatosis 09/13/2014  . Acute maxillary sinusitis 05/14/2014  . Personal history of colonic polyps 03/10/2014  . 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 Lumbosacral spondylosis without myelopathy 05/12/2013  . Insomnia, persistent 02/04/2013  . Obesity (BMI 30-39.9) 02/04/2013  . Unspecified asthma, with exacerbation 09/22/2012  . Visit for screening mammogram 07/04/2012  . Nephrolithiasis   . HTN (hypertension)   . Gallstones   . LBBB (left bundle branch block)   . Leukocytosis 12/20/2008  . Hyperlipidemia with target LDL less than 100 09/04/2007  . Chronic depression 09/04/2007  . GERD 09/04/2007    is allergic to invokana [canagliflozin] and amoxicillin.  MEDICAL HISTORY: Past Medical History:  Diagnosis Date  . Arthritis   . Chest pain  Nuclear, adenosine,  December, 2013, low risk nuclear scan with small, moderate in intensity, fixed anteroseptal defect. This is possibly related to an LBBB versus small prior infarct. No ischemia  . Depression   . Gallstones 11-06  . GERD (gastroesophageal reflux  disease)   . History of kidney stones   . HTN (hypertension)   . Hyperlipidemia   . Kidney mass 01/28/2014  . LBBB (left bundle branch block)   . Nephrolithiasis   . Pneumonia   . 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 Type II or unspecified type diabetes mellitus without mention of complication, not stated as uncontrolled     SURGICAL HISTORY: Past Surgical History:  Procedure Laterality Date  . APPENDECTOMY    . BREAST LUMPECTOMY WITH RADIOACTIVE SEED AND SENTINEL LYMPH NODE BIOPSY Right 04/15/2019   Procedure: RIGHT BREAST PARTIAL MASTECTOMY WITH RADIOACTIVE SEED AND SENTINEL LYMPH NODE BIOPSY;  Surgeon: Coralie Keens, MD;  Location: [facility];  Service: General;  Laterality: Right;  . CESAREAN SECTION     x2 ? w/appy  . CHOLECYSTECTOMY    . ESOPHAGOGASTRODUODENOSCOPY    . HERNIA REPAIR    . KNEE ARTHROSCOPY Right 11/2005  . LIVER BIOPSY    . PANCREATIC CYST EXCISION    . PORTACATH PLACEMENT Left 04/15/2019   Procedure: INSERTION PORT-A-CATH WITH ULTRASOUND;  Surgeon: Coralie Keens, MD;  Location: [facility];  Service: General;  Laterality: Left;  . SPLENECTOMY    . TONSILLECTOMY AND ADENOIDECTOMY      SOCIAL HISTORY: Social History   Socioeconomic History  . Marital status: Married    Spouse name: Margaret Norman  . Number of children: 2  . Years of education: Not on file  . Highest education level: Not on file  Occupational History  . Occupation: CSR    Employer: [employer]  . Occupation: retired  Tobacco Use  . Smoking status: Never Smoker  . Smokeless tobacco: Never Used  Substance and Sexual Activity  . Alcohol use: No  . Drug use: No  . Sexual activity: Not Currently  Other Topics Concern  . Not on file  Social History Narrative   Regular Exercise -  NO   Social Determinants of Health   Financial Resource Strain:   . Difficulty of Paying Living Expenses: Not on file  Food Insecurity: No Food Insecurity  . Worried About Ship broker in the Last Year: Never true  . Ran Out of Food in the Last Year: Never true  Transportation Needs:   . Lack of Transportation (Medical): Not on file  . Lack of Transportation (Non-Medical): Not on file  Physical Activity:   . Days of Exercise per Week: Not on file  . Minutes of Exercise per Session: Not on file  Stress: No Stress Concern Present  . Feeling of Stress : Not at all  Social Connections: Unknown  . Frequency of Communication with Friends and Family: Not on file  . Frequency of Social Gatherings with Friends and Family: Not on file  . Attends Religious Services: Not on file  . Active Member of Clubs or Organizations: Not on file  . Attends Archivist Meetings: Not on file  . Marital Status: Married  Human resources officer Violence: Not At Risk  . Fear of Current or Ex-Partner: No  . Emotionally Abused: No  . Physically Abused: No  . Sexually Abused: No    FAMILY HISTORY: Family History  Problem Relation Age of Onset  . Liver disease  Mother   . Dementia Mother   . Diabetes Mother        borderline  . Coronary artery disease Father   . Heart attack Father   . Hypertension Father   . Heart disease Father   . Cancer Other        leukemia  . Stroke Maternal Grandfather   . Hyperlipidemia Brother        Amyloidosis  . Diabetes Maternal Grandmother   . Cancer Paternal Uncle        unknown  . Heart attack Paternal Grandmother   . Heart attack Paternal Uncle   . Hypertension Brother   . Diabetes Daughter        borderline  . Colon cancer Neg Hx     Review of Systems  Constitutional: Negative for appetite change, chills, fatigue, fever and unexpected weight change.  HENT:   Negative for hearing loss, lump/mass and sore throat.   Eyes: Negative for eye problems and icterus.  Respiratory: Negative for chest tightness, cough and shortness of breath.   Cardiovascular: Negative for chest pain, leg swelling and palpitations.  Gastrointestinal: Negative  for abdominal distention, abdominal pain, constipation, diarrhea, nausea and vomiting.  Endocrine: Negative for hot flashes.  Musculoskeletal: Negative for arthralgias.  Skin: Negative for itching.  Neurological: Negative for dizziness, extremity weakness, headaches and numbness.  Hematological: Negative for adenopathy. Does not bruise/bleed easily.  Psychiatric/Behavioral: Negative for depression. The patient is not nervous/anxious.       PHYSICAL EXAMINATION  ECOG PERFORMANCE STATUS: 1 - Symptomatic but completely ambulatory  Vitals:   07/15/19 0902  BP: 112/70  Pulse: (!) 113  Resp: 18  Temp: 98.7 F (37.1 C)  SpO2: 98%    Physical Exam Constitutional:      General: She is not in acute distress.    Appearance: Normal appearance. She is not toxic-appearing.  HENT:     Head: Normocephalic and atraumatic.  Eyes:     General: No scleral icterus. Cardiovascular:     Rate and Rhythm: Normal rate and regular rhythm.     Pulses: Normal pulses.     Heart sounds: Normal heart sounds.  Pulmonary:     Effort: Pulmonary effort is normal.     Breath sounds: Normal breath sounds.  Abdominal:     General: Abdomen is flat. Bowel sounds are normal. There is no distension.     Palpations: Abdomen is soft.  Musculoskeletal:        General: No swelling.     Cervical back: Neck supple.  Lymphadenopathy:     Cervical: No cervical adenopathy.  Skin:    General: Skin is warm and dry.     Capillary Refill: Capillary refill takes less than 2 seconds.     Findings: No rash.  Neurological:     General: No focal deficit present.     Mental Status: She is alert.  Psychiatric:        Mood and Affect: Mood normal.        Behavior: Behavior normal.     LABORATORY DATA:  CBC    Component Value Date/Time   WBC 13.9 (H) 07/15/2019 0850   WBC 27.4 (H) 04/16/2019 0430   RBC 3.72 (L) 07/15/2019 0850   HGB 11.2 (L) 07/15/2019 0850   HGB 11.5 (L) 01/08/2014 1121   HCT 35.3 (L)  07/15/2019 0850   HCT 36.1 01/08/2014 1121   PLT 576 (H) 07/15/2019 0850   PLT 692 (H) 01/08/2014 1121  MCV 94.9 07/15/2019 0850   MCV 87.2 01/08/2014 1121   MCH 30.1 07/15/2019 0850   MCHC 31.7 07/15/2019 0850   RDW 16.2 (H) 07/15/2019 0850   RDW 15.5 (H) 01/08/2014 1121   LYMPHSABS 5.2 (H) 07/15/2019 0850   LYMPHSABS 7.4 (H) 01/08/2014 1121   MONOABS 1.3 (H) 07/15/2019 0850   MONOABS 1.4 (H) 01/08/2014 1121   EOSABS 0.6 (H) 07/15/2019 0850   EOSABS 0.3 01/08/2014 1121   BASOSABS 0.1 07/15/2019 0850   BASOSABS 0.0 01/08/2014 1121    CMP     Component Value Date/Time   NA 141 07/08/2019 0820   NA 146 (H) 05/19/2018 0843   K 3.4 (L) 07/08/2019 0820   CL 109 07/08/2019 0820   CO2 19 (L) 07/08/2019 0820   GLUCOSE 210 (H) 07/08/2019 0820   BUN 12 07/08/2019 0820   BUN 19 05/19/2018 0843   CREATININE 0.84 07/08/2019 0820   CREATININE 0.96 10/09/2017 1022   CALCIUM 8.7 (L) 07/08/2019 0820   PROT 6.3 (L) 07/08/2019 0820   PROT 6.7 05/19/2018 0843   ALBUMIN 3.9 07/08/2019 0820   ALBUMIN 4.6 05/19/2018 0843   AST 23 07/08/2019 0820   ALT 31 07/08/2019 0820   ALKPHOS 72 07/08/2019 0820   BILITOT 0.2 (L) 07/08/2019 0820   GFRNONAA >60 07/08/2019 0820   GFRNONAA 61 10/09/2017 1022   GFRAA >60 07/08/2019 0820   GFRAA 71 10/09/2017 1022        ASSESSMENT and PLAN:   Malignant neoplasm of upper-outer quadrant of right breast in female, estrogen receptor positive ([place]) Routine screening mammogram detected a 1.3cm mass in the right breast, 9 o'clock position, no axillary adenopathy. 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 T1c N0 stage Ia  Recommendations: 1. 04/15/19: Breast conserving surgery: Right lumpectomy Margaret Norman): IDC, grade 2, 1.3cm, with intermediate grade DCIS, clear margins, and 1 right axillary lymph node negative 2.Adj chemo with Taxol-Herceptin foll by Herceptin maintenance for 1 yearstarted  05/13/2019 3. Adjuvant radiation therapy followed by 4. Adjuvant antiestrogen therapy  Enrolled inSWOG 1714 trials.  ------------------------------------------------------------------------------------------------------------------------- Current treatment: Cycle8Taxol Herceptin EchoEF 55 to 60% 04/15/2019  Chemo toxicities: Grade 1 peripheral neuropathy.  This is stable.  [place] is doing well and will continue on current treatment.  She has some vision issues, I explained that the dexamethasone she receives pre treatment is likely having an effect as it can shift the fluid around in the eye and create difficulty focusing.  She is going to postpone her eye visit until a few weeks after she completes treatment.    I ordered repeat echo to be scheduled as it has been 3 months since her most recent one.    She will continue on weekly treatment and will f/u with myself or Dr. Lindi Adie with every other treatment.       Orders Placed This Encounter  Procedures  . ECHOCARDIOGRAM COMPLETE    Standing Status:   Future    Standing Expiration Date:   10/12/2020    Order Specific Question:   Where should this test be performed    Answer:   [facility]    Order Specific Question:   Perflutren DEFINITY (image enhancing agent) should be administered unless hypersensitivity or allergy exist    Answer:   Administer Perflutren    Order Specific Question:   Reason for exam-Echo    Answer:   Chemotherapy evaluation  v87.41 / v58.11    All  questions were answered. The patient knows to call the clinic with any problems, questions or concerns. We can certainly see the patient much sooner if necessary.  Total time this encounter: 20 minutes.   This note was electronically signed. Scot Dock, NP 07/15/2019

## 2019-07-15 NOTE — Progress Notes (Signed)
Nutrition Assessment   Reason for Assessment:   Patient identified on Malnutrition Screening report for weight loss and poor appetite.  ASSESSMENT:  69 year old female with right breast cancer.  S/p right lumpectomy on 04/15/19.  Patient started adjuvant chemotherapy on 05/13/2019 (taxol and herceptin), then radiation and antiestrogens.  Met with patient during infusion and introduce self and service at [facility].  Patient reports that she really does not have much of an appetite and eats about 1 meal per day.  Has some issues with mild nausea at times.  Recently purchased premier protein shake and drank one this am.  Has also drank glucerna in the past.      Medications: vit B 12, trulicity, jardiance, lantus, vit B 6, metformin, prilosec, zofran, compazine   Labs: glucose 122   Anthropometrics:   Height: 62 inches Weight: 141 lb on 1/20 Noted weight 11/6 151 lb BMI: 26  7% weight loss in the last 2 1/2 months, signficant   Estimated Energy Needs  Kcals: 1900-2200 Protein: 95-110 g Fluid: > 1.9 L   NUTRITION DIAGNOSIS: Inadequate oral intake related to cancer related treatment side effects as evidenced by 7% weight loss in 2 1/2 months and poor appetite   INTERVENTION:  Discussed importance of good nutrition during treatment.  Encouraged setting schedule to "nibble" every 2-3 hours.   Discussed foods high in protein. Provided handout on high calorie, high protein foods Encouraged drinking oral nutrition supplements BID for added nutrition and prevent continued weight loss Contact information was provided   MONITORING, EVALUATION, GOAL: Patient will consume adequate calories and protein to prevent further weight loss   Next Visit: Feb 3rd during infusion  Kanyon Seibold B. Zenia Resides, [place], [place] Registered Dietitian [phone] (pager)

## 2019-07-15 NOTE — Assessment & Plan Note (Addendum)
Routine screening mammogram detected a 1.3cm mass in the right breast, 9 o'clock position, no axillary adenopathy. 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 T1c N0 stage Ia  Recommendations: 1. 04/15/19: Breast conserving surgery: Right lumpectomy Margaret Norman): IDC, grade 2, 1.3cm, with intermediate grade DCIS, clear margins, and 1 right axillary lymph node negative 2.Adj chemo with Taxol-Herceptin foll by Herceptin maintenance for 1 yearstarted 05/13/2019 3. Adjuvant radiation therapy followed by 4. Adjuvant antiestrogen therapy  Enrolled inSWOG 1714 trials.  ------------------------------------------------------------------------------------------------------------------------- Current treatment: Cycle8Taxol Herceptin EchoEF 55 to 60% 04/15/2019  Chemo toxicities: Grade 1 peripheral neuropathy.  This is stable.  Margaret Norman is doing well and will continue on current treatment.  She has some vision issues, I explained that the dexamethasone she receives pre treatment is likely having an effect as it can shift the fluid around in the eye and create difficulty focusing.  She is going to postpone her eye visit until a few weeks after she completes treatment.    I ordered repeat echo to be scheduled as it has been 3 months since her most recent one.    She will continue on weekly treatment and will f/u with myself or Dr. Lindi Adie with every other treatment.

## 2019-07-15 NOTE — Patient Instructions (Signed)
[facility] Discharge Instructions for Patients Receiving Chemotherapy  Today you received the following chemotherapy agents: Trastuzumab, Taxol  To help prevent nausea and vomiting after your treatment, we encourage you to take your nausea medication as directed.   If you develop nausea and vomiting that is not controlled by your nausea medication, call the clinic.   BELOW ARE SYMPTOMS THAT SHOULD BE REPORTED IMMEDIATELY:  *FEVER GREATER THAN 100.5 F  *CHILLS WITH OR WITHOUT FEVER  NAUSEA AND VOMITING THAT IS NOT CONTROLLED WITH YOUR NAUSEA MEDICATION  *UNUSUAL SHORTNESS OF BREATH  *UNUSUAL BRUISING OR BLEEDING  TENDERNESS IN MOUTH AND THROAT WITH OR WITHOUT PRESENCE OF ULCERS  *URINARY PROBLEMS  *BOWEL PROBLEMS  UNUSUAL RASH Items with * indicate a potential emergency and should be followed up as soon as possible.  Feel free to call the clinic should you have any questions or concerns. The clinic phone number is [phone].  Please show the CHEMO ALERT CARD at check-in to the Emergency Department and triage nurse.   

## 2019-07-16 ENCOUNTER — Other Ambulatory Visit: Payer: Self-pay | Admitting: Internal Medicine

## 2019-07-16 ENCOUNTER — Other Ambulatory Visit: Payer: Self-pay | Admitting: *Deleted

## 2019-07-16 DIAGNOSIS — C50411 Malignant neoplasm of upper-outer quadrant of right female breast: Secondary | ICD-10-CM

## 2019-07-16 DIAGNOSIS — Z170 Estrogen receptor positive status [ER+]: Secondary | ICD-10-CM

## 2019-07-18 ENCOUNTER — Encounter: Payer: Self-pay | Admitting: Family

## 2019-07-20 ENCOUNTER — Other Ambulatory Visit: Payer: Self-pay

## 2019-07-20 ENCOUNTER — Ambulatory Visit (HOSPITAL_COMMUNITY)
Admission: RE | Admit: 2019-07-20 | Discharge: 2019-07-20 | Disposition: A | Discharge status: Home / Self Care | Payer: Medicare Other | Source: Ambulatory Visit | Attending: Adult Health | Admitting: Adult Health

## 2019-07-20 ENCOUNTER — Encounter: Payer: Self-pay | Admitting: Family

## 2019-07-20 DIAGNOSIS — Z5181 Encounter for therapeutic drug level monitoring: Secondary | ICD-10-CM | POA: Diagnosis present

## 2019-07-20 DIAGNOSIS — E119 Type 2 diabetes mellitus without complications: Secondary | ICD-10-CM | POA: Insufficient documentation

## 2019-07-20 DIAGNOSIS — Z170 Estrogen receptor positive status [ER+]: Secondary | ICD-10-CM | POA: Insufficient documentation

## 2019-07-20 DIAGNOSIS — I119 Hypertensive heart disease without heart failure: Secondary | ICD-10-CM | POA: Insufficient documentation

## 2019-07-20 DIAGNOSIS — Z79899 Other long term (current) drug therapy: Secondary | ICD-10-CM | POA: Diagnosis not present

## 2019-07-20 DIAGNOSIS — E785 Hyperlipidemia, unspecified: Secondary | ICD-10-CM | POA: Insufficient documentation

## 2019-07-20 DIAGNOSIS — I447 Left bundle-branch block, unspecified: Secondary | ICD-10-CM | POA: Diagnosis not present

## 2019-07-20 DIAGNOSIS — C50411 Malignant neoplasm of upper-outer quadrant of right female breast: Secondary | ICD-10-CM | POA: Diagnosis not present

## 2019-07-20 DIAGNOSIS — I080 Rheumatic disorders of both mitral and aortic valves: Secondary | ICD-10-CM | POA: Diagnosis not present

## 2019-07-20 NOTE — Progress Notes (Signed)
  Echocardiogram 2D Echocardiogram has been performed.  Margaret Norman 07/20/2019, 12:11 PM

## 2019-07-21 ENCOUNTER — Telehealth: Payer: Self-pay | Admitting: Adult Health

## 2019-07-21 NOTE — Progress Notes (Signed)
Patient Care Team: [name], [place] as PCP - General (Internal Medicine) [name], [name], MD as PCP - Cardiology (Cardiology) [name], RN as Oncology Nurse Navigator [name], RN as Oncology Nurse Navigator  DIAGNOSIS:    ICD-10-CM   1. Malignant neoplasm of upper-outer quadrant of right breast in female, estrogen receptor positive ([place])  C50.411    Z17.0     SUMMARY OF ONCOLOGIC HISTORY: Oncology History  Malignant neoplasm of upper-outer quadrant of right breast in female, estrogen receptor positive ([place])  03/16/2019 Cancer Staging   Staging form: Breast, AJCC 8th Edition - Clinical stage from 03/16/2019: Stage IA (cT1c, cN0, cM0, G2, ER+, PR+, HER2+) - Signed by [name], NP on 03/25/2019   03/25/2019 Initial Diagnosis   Routine screening mammogram detected a 1.3cm mass in the right breast, 9 o'clock position, no axillary adenopathy. 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    04/15/2019 Surgery   Right lumpectomy [name]): IDC, grade 2, 1.3cm, with intermediate grade DCIS, clear margins, and 1 right axillary lymph node negative   04/15/2019 Cancer Staging   Staging form: Breast, AJCC 8th Edition - Pathologic stage from 04/15/2019: Stage IA (pT1c, pN0, cM0, G2, ER+, PR+, HER2+) - Signed by [name], NP on 04/29/2019   05/13/2019 -  Chemotherapy   Weekly Taxol and Herceptin x 12, then maintenance Herceptin every 3 weeks x 1 year     CHIEF COMPLIANT: Cycle11Taxol Herceptin  INTERVAL HISTORY: Margaret Norman is a 69 y.o. with above-mentioned history of right breast cancerwho underwent alumpectomy. She is currently on adjuvant chemotherapy with Taxol Herceptin.Echo on 07/19/18 showed an ejection fraction of 30-35%, significantly decreased from her prior study. She presents to the clinic todayfor follow-up.  Patient appears to be struggling with depression symptoms  and she has been tearful.  She and her husband have had some falling out recently as well.  She was very emotional today.  Denies any neuropathy.  Denies any nausea or vomiting.  She has such fatigue that she is tired easily and sleeps very early.  She has also been forgetting to take her insulin shots.  Therefore her blood sugars are elevated today.  ALLERGIES:  is allergic to invokana [canagliflozin] and amoxicillin.  MEDICATIONS:  Current Outpatient Medications  Medication Sig Dispense Refill  . acetaminophen (TYLENOL) 500 MG tablet Take 1,000 mg by mouth every 6 (six) hours as needed for moderate pain.    Marland Kitchen aspirin 81 MG EC tablet Take 81 mg by mouth daily.      . Blood Glucose Monitoring Suppl (ONETOUCH VERIO FLEX SYSTEM) W/DEVICE KIT 1 kit by Does not apply route 3 (three) times daily. 1 kit 1  . buPROPion (WELLBUTRIN XL) 150 MG 24 hr tablet Take 1 tablet (150 mg total) by mouth every morning. 90 tablet 3  . Cyanocobalamin (VITAMIN B12 PO) Take 2,500 mcg by mouth daily.    . empagliflozin (JARDIANCE) 25 MG TABS tablet Take 25 mg by mouth daily. 30 tablet 11  . fluticasone (FLONASE) 50 MCG/ACT nasal spray Place 2 sprays into both nostrils daily. 16 g 6  . glucose blood (ONETOUCH VERIO) test strip Test three times daily as directed. DX: E11.9 300 each 3  . Insulin Pen Needle (B-D ULTRAFINE III SHORT PEN) 31G X 8 MM MISC USE DAILY WITH LANTUS  INSULIN 90 each 1  . LANTUS SOLOSTAR 100 UNIT/ML Solostar Pen INJECT SUBCUTANEOUSLY  30  UNITS AT BEDTIME 30 mL 3  . lidocaine-prilocaine (EMLA) cream Apply to affected area once 30 g 3  . metFORMIN (GLUCOPHAGE) 1000 MG tablet TAKE 1 TABLET BY MOUTH  TWICE DAILY WITH MEALS 180 tablet 3  . olmesartan (BENICAR) 40 MG tablet Take 1 tablet (40 mg total) by mouth daily. 90 tablet 3  . omeprazole (PRILOSEC) 40 MG capsule TAKE 1 CAPSULE BY MOUTH  DAILY 90 capsule 3  . ondansetron (ZOFRAN) 8 MG tablet Take 1 tablet (8 mg total) by mouth 2 (two) times daily as  needed (Nausea or vomiting). 30 tablet 1  . ONE TOUCH LANCETS MISC Test three times daily as directed. DX: E11.9 300 each 3  . Probiotic Product (PROBIOTIC ADVANCED PO) Take 1 tablet by mouth at bedtime.     . prochlorperazine (COMPAZINE) 10 MG tablet Take 1 tablet (10 mg total) by mouth every 6 (six) hours as needed (Nausea or vomiting). 30 tablet 1  . pyridOXINE (VITAMIN B-6) 100 MG tablet Take 100 mg by mouth daily.     . rosuvastatin (CRESTOR) 10 MG tablet Take 1 tablet (10 mg total) by mouth daily. 90 tablet 3  . sertraline (ZOLOFT) 100 MG tablet Take 1 tablet (100 mg total) by mouth daily. 90 tablet 3  . TRULICITY 3 PF/2.9WK SOPN INJECT 3 MG INTO THE SKIN  WEEKLY 12 pen 2  . Vitamin E 180 MG CAPS Take 180 mg by mouth daily.     No current facility-administered medications for this visit.    PHYSICAL EXAMINATION: ECOG PERFORMANCE STATUS: 1 - Symptomatic but completely ambulatory  Vitals:   07/22/19 0857  BP: 115/65  Pulse: (!) 107  Resp: 18  Temp: (!) 97.5 F (36.4 C)  SpO2: 99%   Filed Weights   07/22/19 0857  Weight: 141 lb 3.2 oz (64 kg)    LABORATORY DATA:  I have reviewed the data as listed CMP Latest Ref Rng & Units 07/22/2019 07/15/2019 07/08/2019  Glucose 70 - 99 mg/dL 193(H) 122(H) 210(H)  BUN 8 - 23 mg/dL _0 Creatinine 0.44 - 1.00 mg/dL 0.75 0.76 0.84  Sodium 135 - 145 mmol/L 144 143 141  Potassium 3.5 - 5.1 mmol/L 3.8 4.2 3.4(L)  Chloride 98 - 111 mmol/L 111 107 109  CO2 22 - 32 mmol/L 22 24 19(L)  Calcium 8.9 - 10.3 mg/dL 8.6(L) 9.2 8.7(L)  Total Protein 6.5 - 8.1 g/dL 6.0(L) 6.3(L) 6.3(L)  Total Bilirubin 0.3 - 1.2 mg/dL 0.3 0.2(L) 0.2(L)  Alkaline Phos 38 - 126 U/L 60 66 72  AST 15 - 41 U/L _1 ALT 0 - 44 U/L _2 Lab Results  Component Value Date   WBC 13.3 (H) 07/22/2019   HGB 10.4 (L) 07/22/2019   HCT 32.5 (L) 07/22/2019   MCV 93.1 07/22/2019   PLT 473 (H) 07/22/2019   NEUTROABS 6.1 07/22/2019    ASSESSMENT & PLAN:    Malignant neoplasm of upper-outer quadrant of right breast in female, estrogen receptor positive ([place]) Routine screening mammogram detected a 1.3cm mass in the right breast, 9 o'clock position, no axillary adenopathy. 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 T1c N0 stage Ia  Recommendations: 1. 04/15/19: Breast conserving surgery: Right lumpectomy [name]): IDC, grade 2, 1.3cm, with intermediate grade DCIS, clear margins, and 1 right axillary lymph node negative 2.Adj chemo with Taxol-Herceptin foll by Herceptin maintenance for 1  yearstarted 05/13/2019 3. Adjuvant radiation therapy followed by 4. Adjuvant antiestrogen therapy  Enrolled inSWOG 1714 trials.  ------------------------------------------------------------------------------------------------------------------------- Current treatment: Cycle8Taxol Herceptin EchoEF 55 to 60%04/15/2019  Chemo toxicities: 1. Grade 1 peripheral neuropathy.  This is stable 2.  Decline in cardiac ejection fraction: Echocardiogram 07/20/2019: EF 30 to 35% (declined from 55 to 60% in October) I discussed with her that this is a reversible phenomenon related to Herceptin. We will hold further Herceptin until the echocardiogram improves. I will request Dr. Haroldine Laws her on-call cardiologist to see her in consultation. We will await improvement in cardiac ejection fraction before resuming Herceptin.  Depression: On antidepressants.  I discussed with her that her symptoms of depression may have gotten worse because of hormonal imbalances related to chemotherapy.  Elevated blood sugars: I discussed with her about being compliant with her diabetic treatment plan. Return to clinic in 1 week for final treatment with Taxol. Once we get the green signal from cardiology then we will arrange for Herceptin maintenance therapy.    No orders of the defined types were placed in this encounter.  The  patient has a good understanding of the overall plan. she agrees with it. she will call with any problems that may develop before the next visit here.  Total time spent: 30 mins including face to face time and time spent for planning, charting and coordination of care  Nicholas Lose, MD 07/22/2019  I, Cloyde Reams Dorshimer, am acting as scribe for Dr. Nicholas Lose.  I have reviewed the above documentation for accuracy and completeness, and I agree with the above.

## 2019-07-21 NOTE — Telephone Encounter (Signed)
Called and LMOM that I needed to talk to Aerionna about her echo results.  Wilber Bihari, NP

## 2019-07-22 ENCOUNTER — Inpatient Hospital Stay (HOSPITAL_BASED_OUTPATIENT_CLINIC_OR_DEPARTMENT_OTHER): Payer: Medicare Other | Admitting: Hematology and Oncology

## 2019-07-22 ENCOUNTER — Other Ambulatory Visit: Payer: Self-pay

## 2019-07-22 ENCOUNTER — Encounter: Payer: Self-pay | Admitting: Hematology and Oncology

## 2019-07-22 ENCOUNTER — Inpatient Hospital Stay: Payer: Medicare Other

## 2019-07-22 ENCOUNTER — Telehealth (HOSPITAL_COMMUNITY): Payer: Self-pay | Admitting: Vascular Surgery

## 2019-07-22 VITALS — HR 100

## 2019-07-22 DIAGNOSIS — C50411 Malignant neoplasm of upper-outer quadrant of right female breast: Secondary | ICD-10-CM | POA: Diagnosis not present

## 2019-07-22 DIAGNOSIS — Z170 Estrogen receptor positive status [ER+]: Secondary | ICD-10-CM

## 2019-07-22 DIAGNOSIS — Z95828 Presence of other vascular implants and grafts: Secondary | ICD-10-CM

## 2019-07-22 DIAGNOSIS — Z5112 Encounter for antineoplastic immunotherapy: Secondary | ICD-10-CM | POA: Diagnosis not present

## 2019-07-22 LAB — CBC WITH DIFFERENTIAL (CANCER CENTER ONLY)
Abs Immature Granulocytes: 0.17 10*3/uL — ABNORMAL HIGH (ref 0.00–0.07)
Basophils Absolute: 0.1 10*3/uL (ref 0.0–0.1)
Basophils Relative: 1 %
Eosinophils Absolute: 0.7 10*3/uL — ABNORMAL HIGH (ref 0.0–0.5)
Eosinophils Relative: 5 %
HCT: 32.5 % — ABNORMAL LOW (ref 36.0–46.0)
Hemoglobin: 10.4 g/dL — ABNORMAL LOW (ref 12.0–15.0)
Immature Granulocytes: 1 %
Lymphocytes Relative: 37 %
Lymphs Abs: 4.9 10*3/uL — ABNORMAL HIGH (ref 0.7–4.0)
MCH: 29.8 pg (ref 26.0–34.0)
MCHC: 32 g/dL (ref 30.0–36.0)
MCV: 93.1 fL (ref 80.0–100.0)
Monocytes Absolute: 1.3 10*3/uL — ABNORMAL HIGH (ref 0.1–1.0)
Monocytes Relative: 10 %
Neutro Abs: 6.1 10*3/uL (ref 1.7–7.7)
Neutrophils Relative %: 46 %
Platelet Count: 473 10*3/uL — ABNORMAL HIGH (ref 150–400)
RBC: 3.49 MIL/uL — ABNORMAL LOW (ref 3.87–5.11)
RDW: 16.1 % — ABNORMAL HIGH (ref 11.5–15.5)
WBC Count: 13.3 10*3/uL — ABNORMAL HIGH (ref 4.0–10.5)
nRBC: 0.2 % (ref 0.0–0.2)

## 2019-07-22 LAB — CMP (CANCER CENTER ONLY)
ALT: 23 U/L (ref 0–44)
AST: 18 U/L (ref 15–41)
Albumin: 3.6 g/dL (ref 3.5–5.0)
Alkaline Phosphatase: 60 U/L (ref 38–126)
Anion gap: 11 (ref 5–15)
BUN: 17 mg/dL (ref 8–23)
CO2: 22 mmol/L (ref 22–32)
Calcium: 8.6 mg/dL — ABNORMAL LOW (ref 8.9–10.3)
Chloride: 111 mmol/L (ref 98–111)
Creatinine: 0.75 mg/dL (ref 0.44–1.00)
GFR, Est AFR Am: >60 mL/min (ref >60)
GFR, Estimated: >60 mL/min (ref >60)
Glucose, Bld: 193 mg/dL — ABNORMAL HIGH (ref 70–99)
Potassium: 3.8 mmol/L (ref 3.5–5.1)
Sodium: 144 mmol/L (ref 135–145)
Total Bilirubin: 0.3 mg/dL (ref 0.3–1.2)
Total Protein: 6 g/dL — ABNORMAL LOW (ref 6.5–8.1)

## 2019-07-22 MED ORDER — HEPARIN SOD (PORK) LOCK FLUSH 100 UNIT/ML IV SOLN
500.0000 [IU] | Freq: Once | INTRAVENOUS | Status: AC | PRN
  Administered 2019-07-22: 12:00:00 500 [IU]
  Filled 2019-07-22: qty 5

## 2019-07-22 MED ORDER — SODIUM CHLORIDE 0.9% FLUSH
10.0000 mL | Freq: Once | INTRAVENOUS | Status: AC
  Administered 2019-07-22: 10 mL
  Filled 2019-07-22: qty 10

## 2019-07-22 MED ORDER — FAMOTIDINE IN NACL 20-0.9 MG/50ML-% IV SOLN
20.0000 mg | Freq: Once | INTRAVENOUS | Status: AC
  Administered 2019-07-22: 10:00:00 20 mg via INTRAVENOUS

## 2019-07-22 MED ORDER — DEXAMETHASONE SODIUM PHOSPHATE 10 MG/ML IJ SOLN
10.0000 mg | Freq: Once | INTRAMUSCULAR | Status: AC
  Administered 2019-07-22: 10:00:00 10 mg via INTRAVENOUS

## 2019-07-22 MED ORDER — SODIUM CHLORIDE 0.9 % IV SOLN
Freq: Once | INTRAVENOUS | Status: AC
  Filled 2019-07-22: qty 250

## 2019-07-22 MED ORDER — DIPHENHYDRAMINE HCL 50 MG/ML IJ SOLN
25.0000 mg | Freq: Once | INTRAMUSCULAR | Status: AC
  Administered 2019-07-22: 10:00:00 25 mg via INTRAVENOUS

## 2019-07-22 MED ORDER — DIPHENHYDRAMINE HCL 50 MG/ML IJ SOLN
INTRAMUSCULAR | Status: AC
  Filled 2019-07-22: qty 1

## 2019-07-22 MED ORDER — ACETAMINOPHEN 325 MG PO TABS
650.0000 mg | ORAL_TABLET | Freq: Once | ORAL | Status: AC
  Administered 2019-07-22: 10:00:00 650 mg via ORAL

## 2019-07-22 MED ORDER — FAMOTIDINE IN NACL 20-0.9 MG/50ML-% IV SOLN
INTRAVENOUS | Status: AC
  Filled 2019-07-22: qty 50

## 2019-07-22 MED ORDER — ACETAMINOPHEN 325 MG PO TABS
ORAL_TABLET | ORAL | Status: AC
  Filled 2019-07-22: qty 2

## 2019-07-22 MED ORDER — SODIUM CHLORIDE 0.9% FLUSH
10.0000 mL | INTRAVENOUS | Status: DC | PRN
  Administered 2019-07-22: 12:00:00 10 mL
  Filled 2019-07-22: qty 10

## 2019-07-22 MED ORDER — SODIUM CHLORIDE 0.9 % IV SOLN
80.0000 mg/m2 | Freq: Once | INTRAVENOUS | Status: AC
  Administered 2019-07-22: 11:00:00 138 mg via INTRAVENOUS
  Filled 2019-07-22: qty 23

## 2019-07-22 MED ORDER — DEXAMETHASONE SODIUM PHOSPHATE 10 MG/ML IJ SOLN
INTRAMUSCULAR | Status: AC
  Filled 2019-07-22: qty 1

## 2019-07-22 NOTE — Telephone Encounter (Signed)
Left pt message giving new ASAP brst appt w/ [facility] Friday 07/24/19 @ 9:40 AM asked pt to call back to confirm

## 2019-07-22 NOTE — Assessment & Plan Note (Signed)
Routine screening mammogram detected a 1.3cm mass in the right breast, 9 o'clock position, no axillary adenopathy. 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 T1c N0 stage Ia  Recommendations: 1. 04/15/19: Breast conserving surgery: Right lumpectomy [name]): IDC, grade 2, 1.3cm, with intermediate grade DCIS, clear margins, and 1 right axillary lymph node negative 2.Adj chemo with Taxol-Herceptin foll by Herceptin maintenance for 1 yearstarted 05/13/2019 3. Adjuvant radiation therapy followed by 4. Adjuvant antiestrogen therapy  Enrolled inSWOG 1714 trials.  ------------------------------------------------------------------------------------------------------------------------- Current treatment: Cycle8Taxol Herceptin EchoEF 55 to 60%04/15/2019  Chemo toxicities: 1. Grade 1 peripheral neuropathy.  This is stable 2.  Decline in cardiac ejection fraction: Echocardiogram 07/20/2019: EF 30 to 35% (declined from 55 to 60% in October) I discussed with her that this is a reversible phenomenon related to Herceptin. We will hold further Herceptin until the echocardiogram improves. I will request Dr. Haroldine Laws her on-call cardiologist to see her in consultation. We will await improvement in cardiac ejection fraction before resuming Herceptin.  Meanwhile she will continue on with her current chemotherapy plan.

## 2019-07-22 NOTE — Patient Instructions (Signed)
[facility] Discharge Instructions for Patients Receiving Chemotherapy  Today you received the following chemotherapy agents:  Taxol.  To help prevent nausea and vomiting after your treatment, we encourage you to take your nausea medication as directed.   If you develop nausea and vomiting that is not controlled by your nausea medication, call the clinic.   BELOW ARE SYMPTOMS THAT SHOULD BE REPORTED IMMEDIATELY:  *FEVER GREATER THAN 100.5 F  *CHILLS WITH OR WITHOUT FEVER  NAUSEA AND VOMITING THAT IS NOT CONTROLLED WITH YOUR NAUSEA MEDICATION  *UNUSUAL SHORTNESS OF BREATH  *UNUSUAL BRUISING OR BLEEDING  TENDERNESS IN MOUTH AND THROAT WITH OR WITHOUT PRESENCE OF ULCERS  *URINARY PROBLEMS  *BOWEL PROBLEMS  UNUSUAL RASH Items with * indicate a potential emergency and should be followed up as soon as possible.  Feel free to call the clinic should you have any questions or concerns. The clinic phone number is [phone].  Please show the CHEMO ALERT CARD at check-in to the Emergency Department and triage nurse.   

## 2019-07-23 ENCOUNTER — Telehealth (HOSPITAL_COMMUNITY): Payer: Self-pay

## 2019-07-23 NOTE — Telephone Encounter (Signed)

## 2019-07-24 ENCOUNTER — Other Ambulatory Visit: Payer: Self-pay

## 2019-07-24 ENCOUNTER — Ambulatory Visit (HOSPITAL_COMMUNITY)
Admission: RE | Admit: 2019-07-24 | Discharge: 2019-07-24 | Disposition: A | Discharge status: Home / Self Care | Payer: Medicare Other | Source: Ambulatory Visit | Attending: Cardiology | Admitting: Cardiology

## 2019-07-24 ENCOUNTER — Encounter (HOSPITAL_COMMUNITY): Payer: Self-pay | Admitting: Cardiology

## 2019-07-24 VITALS — BP 118/63 | HR 93 | Wt 140.8 lb

## 2019-07-24 DIAGNOSIS — Z794 Long term (current) use of insulin: Secondary | ICD-10-CM | POA: Insufficient documentation

## 2019-07-24 DIAGNOSIS — I447 Left bundle-branch block, unspecified: Secondary | ICD-10-CM | POA: Diagnosis not present

## 2019-07-24 DIAGNOSIS — Z833 Family history of diabetes mellitus: Secondary | ICD-10-CM | POA: Insufficient documentation

## 2019-07-24 DIAGNOSIS — I5022 Chronic systolic (congestive) heart failure: Secondary | ICD-10-CM | POA: Diagnosis not present

## 2019-07-24 DIAGNOSIS — E785 Hyperlipidemia, unspecified: Secondary | ICD-10-CM | POA: Diagnosis not present

## 2019-07-24 DIAGNOSIS — E119 Type 2 diabetes mellitus without complications: Secondary | ICD-10-CM | POA: Insufficient documentation

## 2019-07-24 DIAGNOSIS — I110 Hypertensive heart disease with heart failure: Secondary | ICD-10-CM | POA: Diagnosis not present

## 2019-07-24 DIAGNOSIS — C50911 Malignant neoplasm of unspecified site of right female breast: Secondary | ICD-10-CM | POA: Diagnosis not present

## 2019-07-24 DIAGNOSIS — Z8249 Family history of ischemic heart disease and other diseases of the circulatory system: Secondary | ICD-10-CM | POA: Diagnosis not present

## 2019-07-24 DIAGNOSIS — Z79899 Other long term (current) drug therapy: Secondary | ICD-10-CM | POA: Insufficient documentation

## 2019-07-24 DIAGNOSIS — Z170 Estrogen receptor positive status [ER+]: Secondary | ICD-10-CM | POA: Diagnosis not present

## 2019-07-24 DIAGNOSIS — F329 Major depressive disorder, single episode, unspecified: Secondary | ICD-10-CM | POA: Diagnosis not present

## 2019-07-24 DIAGNOSIS — Z8349 Family history of other endocrine, nutritional and metabolic diseases: Secondary | ICD-10-CM | POA: Diagnosis not present

## 2019-07-24 DIAGNOSIS — Z7982 Long term (current) use of aspirin: Secondary | ICD-10-CM | POA: Insufficient documentation

## 2019-07-24 DIAGNOSIS — C50411 Malignant neoplasm of upper-outer quadrant of right female breast: Secondary | ICD-10-CM | POA: Diagnosis not present

## 2019-07-24 MED ORDER — CARVEDILOL 3.125 MG PO TABS: 3.1250 mg | ORAL_TABLET | Freq: Two times a day (BID) | ORAL | 3 refills | Status: DC

## 2019-07-24 MED ORDER — SACUBITRIL-VALSARTAN 24-26 MG PO TABS: 1.0000 | ORAL_TABLET | Freq: Two times a day (BID) | ORAL | 5 refills | Status: DC

## 2019-07-24 NOTE — Patient Instructions (Signed)
STOP Olmesartan  START Coreg 3.125mg  (1 tab) twice a day  START Entresto 24/26mg  (1 tab) twice a day  Labs in 2 weeks We will only contact you if something comes back abnormal or we need to make some changes. Otherwise no news is good news!  Your physician recommends that you schedule a follow-up appointment in: 2 weeks with the clinical pharmacist  Your physician recommends that you schedule a follow-up appointment in: 2 months with Dr Aundra Dubin and an ECHO  Your physician has requested that you have an echocardiogram. Echocardiography is a painless test that uses sound waves to create images of your heart. It provides your doctor with information about the size and shape of your heart and how well your heart's chambers and valves are working. This procedure takes approximately one hour. There are no restrictions for this procedure.   Please call office at [phone] option 2 if you have any questions or concerns.   At the [facility], you and your health needs are our priority. As part of our continuing mission to provide you with exceptional heart care, we have created designated Provider Care Teams. These Care Teams include your primary Cardiologist (physician) and Advanced Practice Providers (APPs- Physician Assistants and Nurse Practitioners) who all work together to provide you with the care you need, when you need it.   You may see any of the following providers on your designated Care Team at your next follow up: Marland Kitchen Dr Glori Bickers . Dr Loralie Champagne . Darrick Grinder, NP . Lyda Jester, PA . Audry Riles, PharmD   Please be sure to bring in all your medications bottles to every appointment.

## 2019-07-25 NOTE — Progress Notes (Signed)
PCP: [name], FNP Oncology: Dr. Lindi Adie HF cardiology: Dr. Aundra Dubin   69 y.o. with history of DM, chronic LBBB, and breast cancer was referred by Dr. Lindi Adie for workup of fall in EF.  She was diagnosed with right breast cancer, ER+/PR+/HER2+ in 9/20.  She had right lumpectomy in 10/20.  Starting in 11/20, she has been getting 12 cycles of Taxol/Herceptin to be followed by Herceptin alone to complete a year. Echo in 10/20 showed EF 55-60% per report (looked more like 50% on my review, likely due to septal-lateral dyssynchrony).  Her repeat echo for Herceptin screening was done, showing EF down to 30-35% with septal-lateral dyssynchrony.  Herceptin has been stopped for now.   Symptomtically, she is doing ok.  Mild dyspnea walking a long distance.  No orthopnea/PND.  No exertional chest pain. Occasional transient palpitations. Occasional mild lightheadedness when she stands.   Labs (1/21): K 3.8, creatinine 0.75  ECG (personally reviewed): NSR, LBBB   PMH: 1. Depression 2. Type 2 diabetes 3. Chronic LBBB 4. H/o transient complete heart block: in 10/20 when she was having port-a-cath placed.  5. HTN 6. Hyperlipidemia.  7. Breast cancer: She was diagnosed with right breast cancer, ER+/PR+/HER2+ in 9/20.  She had right lumpectomy in 10/20.  Starting in 11/20, she has been getting 12 cycles of Taxol/Herceptin to be followed by Herceptin alone to complete a year.  8. Chronic systolic CHF: She has had mildly decreased EF in the past, possibly related to LBBB.  - Echo (4/17): EF 50-55% - Cardiolite (4/17): EF 48% - Echo (10/20): EF 55-60% (reviewed, think more like 50% but technically difficult).  - Echo (1/21): EF 30-35%, mild LVH, septal dyssynchrony.   Social History   Socioeconomic History  . Marital status: Married    Spouse name: Letesha Klecker  . Number of children: 2  . Years of education: Not on file  . Highest education level: Not on file  Occupational History  .  Occupation: CSR    Employer: [employer]  . Occupation: retired  Tobacco Use  . Smoking status: Never Smoker  . Smokeless tobacco: Never Used  Substance and Sexual Activity  . Alcohol use: No  . Drug use: No  . Sexual activity: Not Currently  Other Topics Concern  . Not on file  Social History Narrative   Regular Exercise -  NO   Social Determinants of Health   Financial Resource Strain:   . Difficulty of Paying Living Expenses: Not on file  Food Insecurity: No Food Insecurity  . Worried About Charity fundraiser in the Last Year: Never true  . Ran Out of Food in the Last Year: Never true  Transportation Needs:   . Lack of Transportation (Medical): Not on file  . Lack of Transportation (Non-Medical): Not on file  Physical Activity:   . Days of Exercise per Week: Not on file  . Minutes of Exercise per Session: Not on file  Stress: No Stress Concern Present  . Feeling of Stress : Not at all  Social Connections: Unknown  . Frequency of Communication with Friends and Family: Not on file  . Frequency of Social Gatherings with Friends and Family: Not on file  . Attends Religious Services: Not on file  . Active Member of Clubs or Organizations: Not on file  . Attends Archivist Meetings: Not on file  . Marital Status: Married  Human resources officer Violence: Not At Risk  . Fear of Current or Ex-Partner:  No  . Emotionally Abused: No  . Physically Abused: No  . Sexually Abused: No   Family History  Problem Relation Age of Onset  . Liver disease Mother   . Dementia Mother   . Diabetes Mother        borderline  . Coronary artery disease Father   . Heart attack Father   . Hypertension Father   . Heart disease Father   . Cancer Other        leukemia  . Stroke Maternal Grandfather   . Hyperlipidemia Brother        Amyloidosis  . Diabetes Maternal Grandmother   . Cancer Paternal Uncle        unknown  . Heart attack Paternal Grandmother   . Heart attack  Paternal Uncle   . Hypertension Brother   . Diabetes Daughter        borderline  . Colon cancer Neg Hx    Current Outpatient Medications  Medication Sig Dispense Refill  . acetaminophen (TYLENOL) 500 MG tablet Take 1,000 mg by mouth every 6 (six) hours as needed for moderate pain.    Marland Kitchen aspirin 81 MG EC tablet Take 81 mg by mouth daily.      . Blood Glucose Monitoring Suppl (ONETOUCH VERIO FLEX SYSTEM) W/DEVICE KIT 1 kit by Does not apply route 3 (three) times daily. 1 kit 1  . buPROPion (WELLBUTRIN XL) 150 MG 24 hr tablet Take 1 tablet (150 mg total) by mouth every morning. 90 tablet 3  . Cyanocobalamin (VITAMIN B12 PO) Take 2,500 mcg by mouth daily.    . empagliflozin (JARDIANCE) 25 MG TABS tablet Take 25 mg by mouth daily. 30 tablet 11  . fluticasone (FLONASE) 50 MCG/ACT nasal spray Place 2 sprays into both nostrils daily. 16 g 6  . glucose blood (ONETOUCH VERIO) test strip Test three times daily as directed. DX: E11.9 300 each 3  . Insulin Pen Needle (B-D ULTRAFINE III SHORT PEN) 31G X 8 MM MISC USE DAILY WITH LANTUS  INSULIN 90 each 1  . LANTUS SOLOSTAR 100 UNIT/ML Solostar Pen INJECT SUBCUTANEOUSLY 30  UNITS AT BEDTIME 30 mL 3  . lidocaine-prilocaine (EMLA) cream Apply to affected area once 30 g 3  . metFORMIN (GLUCOPHAGE) 1000 MG tablet TAKE 1 TABLET BY MOUTH  TWICE DAILY WITH MEALS 180 tablet 3  . omeprazole (PRILOSEC) 40 MG capsule TAKE 1 CAPSULE BY MOUTH  DAILY 90 capsule 3  . ondansetron (ZOFRAN) 8 MG tablet Take 1 tablet (8 mg total) by mouth 2 (two) times daily as needed (Nausea or vomiting). 30 tablet 1  . ONE TOUCH LANCETS MISC Test three times daily as directed. DX: E11.9 300 each 3  . Probiotic Product (PROBIOTIC ADVANCED PO) Take 1 tablet by mouth at bedtime.     . prochlorperazine (COMPAZINE) 10 MG tablet Take 1 tablet (10 mg total) by mouth every 6 (six) hours as needed (Nausea or vomiting). 30 tablet 1  . pyridOXINE (VITAMIN B-6) 100 MG tablet Take 100 mg by mouth daily.      . rosuvastatin (CRESTOR) 10 MG tablet Take 1 tablet (10 mg total) by mouth daily. 90 tablet 3  . sertraline (ZOLOFT) 100 MG tablet Take 1 tablet (100 mg total) by mouth daily. 90 tablet 3  . TRULICITY 3 PP/2.9JJ SOPN INJECT 3 MG INTO THE SKIN  WEEKLY 12 pen 2  . Vitamin E 180 MG CAPS Take 180 mg by mouth daily.    . carvedilol (COREG)  3.125 MG tablet Take 1 tablet (3.125 mg total) by mouth 2 (two) times daily. 60 tablet 3  . sacubitril-valsartan (ENTRESTO) 24-26 MG Take 1 tablet by mouth 2 (two) times daily. 60 tablet 5   No current facility-administered medications for this encounter.   BP 118/63   Pulse 93   Wt 63.9 kg (140 lb 12.8 oz)   SpO2 98%   BMI 25.75 kg/m  General: NAD Neck: No JVD, no thyromegaly or thyroid nodule.  Lungs: Clear to auscultation bilaterally with normal respiratory effort. CV: Nondisplaced PMI.  Heart regular S1/S2, no S3/S4, no murmur.  No peripheral edema.  No carotid bruit.  Normal pedal pulses.  Abdomen: Soft, nontender, no hepatosplenomegaly, no distention.  Skin: Intact without lesions or rashes.  Neurologic: Alert and oriented x 3.  Psych: Normal affect. Extremities: No clubbing or cyanosis.  HEENT: Normal.   Assessment/Plan: 1. Chronic systolic CHF:  Echo in 4/62 showed EF down to 30-35% with septal-lateral dyssynchrony.  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  Based on the timeline, the most common cause for fall in EF was Herceptin use.  Hopefully, function will improve off Herceptin.  NYHA class II, she is not volume overloaded on exam.  - Stop olmesartan, start Entresto 24/26 bid.  - Add Coreg 3.125 mg bid (watch for bradyarrhythmias).  - BMET 2 wks.  - Repeat echo in 2 months after med titration and off Herceptin.  If EF remains low, would plan RHC/LHC at that point.  2. Transient CHB: Noted in the past after  port-a-cath placement, no episodes recently.  - Follow closely with addition of low dose Coreg.  3. LBBB: Chronic.   4. Breast cancer: Herceptin on hold for now.  If EF recovers, would be reasonable to retry Herceptin with close monitoring and ongoing use of cardiomyopathy meds.   Followup with HF pharmacist in 2 wks for medication titration.  See me in 2 months with echo.   Loralie Champagne 07/25/2019

## 2019-07-28 NOTE — Progress Notes (Signed)
Patient Care Team: [name], [place] as PCP - General (Internal Medicine) [name], [name], MD as PCP - Cardiology (Cardiology) [name], RN as Oncology Nurse Navigator [name], RN as Oncology Nurse Navigator  DIAGNOSIS:    ICD-10-CM   1. Malignant neoplasm of upper-outer quadrant of right breast in female, estrogen receptor positive ([place])  C50.411    Z17.0     SUMMARY OF ONCOLOGIC HISTORY: Oncology History  Malignant neoplasm of upper-outer quadrant of right breast in female, estrogen receptor positive ([place])  03/16/2019 Cancer Staging   Staging form: Breast, AJCC 8th Edition - Clinical stage from 03/16/2019: Stage IA (cT1c, cN0, cM0, G2, ER+, PR+, HER2+) - Signed by [name], NP on 03/25/2019   03/25/2019 Initial Diagnosis   Routine screening mammogram detected a 1.3cm mass in the right breast, 9 o'clock position, no axillary adenopathy. 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    04/15/2019 Surgery   Right lumpectomy [name]): IDC, grade 2, 1.3cm, with intermediate grade DCIS, clear margins, and 1 right axillary lymph node negative   04/15/2019 Cancer Staging   Staging form: Breast, AJCC 8th Edition - Pathologic stage from 04/15/2019: Stage IA (pT1c, pN0, cM0, G2, ER+, PR+, HER2+) - Signed by [name], NP on 04/29/2019   05/13/2019 -  Chemotherapy   Weekly Taxol and Herceptin x 12, then maintenance Herceptin every 3 weeks x 1 year     CHIEF COMPLIANT: Cycle12Taxol   INTERVAL HISTORY: Margaret Norman is a 69 y.o. with above-mentioned history of breast cancerwho underwent alumpectomy. She is currently on adjuvant chemotherapy with Taxol Herceptin, but Herceptin was discontinued last week due to a decreased ejection fraction.She presents to the clinic todayfor treatment.   She has very mild peripheral neuropathy symptoms in one of her toes.  Otherwise she has done  amazingly well.  She does have fatigue.  Very mild nausea.  ALLERGIES:  is allergic to invokana [canagliflozin] and amoxicillin.  MEDICATIONS:  Current Outpatient Medications  Medication Sig Dispense Refill  . acetaminophen (TYLENOL) 500 MG tablet Take 1,000 mg by mouth every 6 (six) hours as needed for moderate pain.    Marland Kitchen aspirin 81 MG EC tablet Take 81 mg by mouth daily.      . Blood Glucose Monitoring Suppl (ONETOUCH VERIO FLEX SYSTEM) W/DEVICE KIT 1 kit by Does not apply route 3 (three) times daily. 1 kit 1  . buPROPion (WELLBUTRIN XL) 150 MG 24 hr tablet Take 1 tablet (150 mg total) by mouth every morning. 90 tablet 3  . carvedilol (COREG) 3.125 MG tablet Take 1 tablet (3.125 mg total) by mouth 2 (two) times daily. 60 tablet 3  . Cyanocobalamin (VITAMIN B12 PO) Take 2,500 mcg by mouth daily.    . empagliflozin (JARDIANCE) 25 MG TABS tablet Take 25 mg by mouth daily. 30 tablet 11  . fluticasone (FLONASE) 50 MCG/ACT nasal spray Place 2 sprays into both nostrils daily. 16 g 6  . glucose blood (ONETOUCH VERIO) test strip Test three times daily as directed. DX: E11.9 300 each 3  . Insulin Pen Needle (B-D ULTRAFINE III SHORT PEN) 31G X 8 MM MISC USE DAILY WITH LANTUS  INSULIN 90 each 1  . LANTUS SOLOSTAR 100 UNIT/ML Solostar Pen INJECT SUBCUTANEOUSLY 30  UNITS AT BEDTIME 30 mL 3  . lidocaine-prilocaine (EMLA) cream Apply to affected area once 30 g 3  . metFORMIN (GLUCOPHAGE) 1000 MG tablet  TAKE 1 TABLET BY MOUTH  TWICE DAILY WITH MEALS 180 tablet 3  . omeprazole (PRILOSEC) 40 MG capsule TAKE 1 CAPSULE BY MOUTH  DAILY 90 capsule 3  . ondansetron (ZOFRAN) 8 MG tablet Take 1 tablet (8 mg total) by mouth 2 (two) times daily as needed (Nausea or vomiting). 30 tablet 1  . ONE TOUCH LANCETS MISC Test three times daily as directed. DX: E11.9 300 each 3  . Probiotic Product (PROBIOTIC ADVANCED PO) Take 1 tablet by mouth at bedtime.     . prochlorperazine (COMPAZINE) 10 MG tablet Take 1 tablet (10 mg  total) by mouth every 6 (six) hours as needed (Nausea or vomiting). 30 tablet 1  . pyridOXINE (VITAMIN B-6) 100 MG tablet Take 100 mg by mouth daily.     . rosuvastatin (CRESTOR) 10 MG tablet Take 1 tablet (10 mg total) by mouth daily. 90 tablet 3  . sacubitril-valsartan (ENTRESTO) 24-26 MG Take 1 tablet by mouth 2 (two) times daily. 60 tablet 5  . sertraline (ZOLOFT) 100 MG tablet Take 1 tablet (100 mg total) by mouth daily. 90 tablet 3  . TRULICITY 3 RP/5.9YV SOPN INJECT 3 MG INTO THE SKIN  WEEKLY 12 pen 2  . Vitamin E 180 MG CAPS Take 180 mg by mouth daily.     No current facility-administered medications for this visit.   Facility-Administered Medications Ordered in Other Visits  Medication Dose Route Frequency Provider Last Rate Last Admin  . acetaminophen (TYLENOL) tablet 650 mg  650 mg Oral Once Nicholas Lose, MD      . heparin lock flush 100 unit/mL  500 Units Intracatheter Once PRN Nicholas Lose, MD      . PACLitaxel (TAXOL) 138 mg in sodium chloride 0.9 % 250 mL chemo infusion (</= 47m/m2)  80 mg/m2 (Treatment Plan Recorded) Intravenous Once GNicholas Lose MD 273 mL/hr at 07/29/19 1027 138 mg at 07/29/19 1027  . sodium chloride flush (NS) 0.9 % injection 10 mL  10 mL Intracatheter PRN GNicholas Lose MD        PHYSICAL EXAMINATION: ECOG PERFORMANCE STATUS: 1 - Symptomatic but completely ambulatory  Vitals:   07/29/19 0843  BP: 111/63  Pulse: 93  Resp: 18  Temp: 99.1 F (37.3 C)  SpO2: 98%   Filed Weights   07/29/19 0843  Weight: 140 lb 4.8 oz (63.6 kg)    LABORATORY DATA:  I have reviewed the data as listed CMP Latest Ref Rng & Units 07/29/2019 07/22/2019 07/15/2019  Glucose 70 - 99 mg/dL 145(H) 193(H) 122(H)  BUN 8 - 23 mg/dL 12 17 11   Creatinine 0.44 - 1.00 mg/dL 0.78 0.75 0.76  Sodium 135 - 145 mmol/L 141 144 143  Potassium 3.5 - 5.1 mmol/L 3.6 3.8 4.2  Chloride 98 - 111 mmol/L 108 111 107  CO2 22 - 32 mmol/L 22 22 24   Calcium 8.9 - 10.3 mg/dL 8.8(L) 8.6(L) 9.2    Total Protein 6.5 - 8.1 g/dL 6.2(L) 6.0(L) 6.3(L)  Total Bilirubin 0.3 - 1.2 mg/dL 0.3 0.3 0.2(L)  Alkaline Phos 38 - 126 U/L 56 60 66  AST 15 - 41 U/L 17 18 18   ALT 0 - 44 U/L 19 23 28     Lab Results  Component Value Date   WBC 12.1 (H) 07/29/2019   HGB 10.8 (L) 07/29/2019   HCT 33.7 (L) 07/29/2019   MCV 93.1 07/29/2019   PLT 540 (H) 07/29/2019   NEUTROABS 6.0 07/29/2019    ASSESSMENT & PLAN:  Malignant  neoplasm of upper-outer quadrant of right breast in female, estrogen receptor positive ([place]) Routine screening mammogram detected a 1.3cm mass in the right breast, 9 o'clock position, no axillary adenopathy. 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 T1c N0 stage Ia  Recommendations: 1. 04/15/19: Breast conserving surgery: Right lumpectomy [name]): IDC, grade 2, 1.3cm, with intermediate grade DCIS, clear margins, and 1 right axillary lymph node negative 2.Adj chemo with Taxol-Herceptin foll by Herceptin maintenance for 1 yearstarted 05/13/2019 3. Adjuvant radiation therapy followed by 4. Adjuvant antiestrogen therapy  Enrolled inSWOG 1714 trials.  ------------------------------------------------------------------------------------------------------------------------- Current treatment: Cycle12Taxol Herceptin EchoEF 55 to 60%04/15/2019  Chemo toxicities: 1. Grade 1 peripheral neuropathy. This is stable 2.  Decline in cardiac ejection fraction: Echocardiogram 07/20/2019: EF 30 to 35% (declined from 55 to 60% in October) Seen by Dr.McLean. Cardiac meds adjusted. WE hope her EF will improve and we can resume Herceptin at a later time. 3. Depression: On Anti depressants 4. Elevated sugars:   Patient has been referred for adjuvant radiation therapy. Return to clinic after radiation to begin antiestrogen therapy. She will continue anti-HER-2 therapy after the echocardiograms return back to normal.    No orders of  the defined types were placed in this encounter.  The patient has a good understanding of the overall plan. she agrees with it. she will call with any problems that may develop before the next visit here.  Total time spent: 30 mins including face to face time and time spent for planning, charting and coordination of care  Nicholas Lose, MD 07/29/2019  I, Cloyde Reams Dorshimer, am acting as scribe for Dr. Nicholas Lose.  I have reviewed the above documentation for accuracy and completeness, and I agree with the above.

## 2019-07-29 ENCOUNTER — Inpatient Hospital Stay: Payer: Medicare Other | Attending: Hematology and Oncology

## 2019-07-29 ENCOUNTER — Other Ambulatory Visit: Payer: Self-pay

## 2019-07-29 ENCOUNTER — Encounter: Payer: Self-pay | Admitting: Medical Oncology

## 2019-07-29 ENCOUNTER — Inpatient Hospital Stay: Payer: Medicare Other

## 2019-07-29 ENCOUNTER — Encounter: Payer: Self-pay | Admitting: *Deleted

## 2019-07-29 ENCOUNTER — Inpatient Hospital Stay (HOSPITAL_BASED_OUTPATIENT_CLINIC_OR_DEPARTMENT_OTHER): Payer: Medicare Other | Admitting: Hematology and Oncology

## 2019-07-29 DIAGNOSIS — Z79899 Other long term (current) drug therapy: Secondary | ICD-10-CM

## 2019-07-29 DIAGNOSIS — Z5111 Encounter for antineoplastic chemotherapy: Secondary | ICD-10-CM | POA: Insufficient documentation

## 2019-07-29 DIAGNOSIS — R5383 Other fatigue: Secondary | ICD-10-CM | POA: Insufficient documentation

## 2019-07-29 DIAGNOSIS — Z452 Encounter for adjustment and management of vascular access device: Secondary | ICD-10-CM | POA: Insufficient documentation

## 2019-07-29 DIAGNOSIS — R110 Nausea: Secondary | ICD-10-CM | POA: Insufficient documentation

## 2019-07-29 DIAGNOSIS — Z006 Encounter for examination for normal comparison and control in clinical research program: Secondary | ICD-10-CM | POA: Insufficient documentation

## 2019-07-29 DIAGNOSIS — I427 Cardiomyopathy due to drug and external agent: Secondary | ICD-10-CM

## 2019-07-29 DIAGNOSIS — R21 Rash and other nonspecific skin eruption: Secondary | ICD-10-CM | POA: Diagnosis not present

## 2019-07-29 DIAGNOSIS — G620 Drug-induced polyneuropathy: Secondary | ICD-10-CM

## 2019-07-29 DIAGNOSIS — Z170 Estrogen receptor positive status [ER+]: Secondary | ICD-10-CM

## 2019-07-29 DIAGNOSIS — F329 Major depressive disorder, single episode, unspecified: Secondary | ICD-10-CM | POA: Insufficient documentation

## 2019-07-29 DIAGNOSIS — T451X5A Adverse effect of antineoplastic and immunosuppressive drugs, initial encounter: Secondary | ICD-10-CM

## 2019-07-29 DIAGNOSIS — C50411 Malignant neoplasm of upper-outer quadrant of right female breast: Secondary | ICD-10-CM | POA: Diagnosis not present

## 2019-07-29 DIAGNOSIS — Z95828 Presence of other vascular implants and grafts: Secondary | ICD-10-CM

## 2019-07-29 DIAGNOSIS — R739 Hyperglycemia, unspecified: Secondary | ICD-10-CM | POA: Insufficient documentation

## 2019-07-29 LAB — CBC WITH DIFFERENTIAL (CANCER CENTER ONLY)
Abs Immature Granulocytes: 0.12 10*3/uL — ABNORMAL HIGH (ref 0.00–0.07)
Basophils Absolute: 0.1 10*3/uL (ref 0.0–0.1)
Basophils Relative: 1 %
Eosinophils Absolute: 0.3 10*3/uL (ref 0.0–0.5)
Eosinophils Relative: 2 %
HCT: 33.7 % — ABNORMAL LOW (ref 36.0–46.0)
Hemoglobin: 10.8 g/dL — ABNORMAL LOW (ref 12.0–15.0)
Immature Granulocytes: 1 %
Lymphocytes Relative: 38 %
Lymphs Abs: 4.6 10*3/uL — ABNORMAL HIGH (ref 0.7–4.0)
MCH: 29.8 pg (ref 26.0–34.0)
MCHC: 32 g/dL (ref 30.0–36.0)
MCV: 93.1 fL (ref 80.0–100.0)
Monocytes Absolute: 1 10*3/uL (ref 0.1–1.0)
Monocytes Relative: 9 %
Neutro Abs: 6 10*3/uL (ref 1.7–7.7)
Neutrophils Relative %: 49 %
Platelet Count: 540 10*3/uL — ABNORMAL HIGH (ref 150–400)
RBC: 3.62 MIL/uL — ABNORMAL LOW (ref 3.87–5.11)
RDW: 16 % — ABNORMAL HIGH (ref 11.5–15.5)
WBC Count: 12.1 10*3/uL — ABNORMAL HIGH (ref 4.0–10.5)
nRBC: 0.2 % (ref 0.0–0.2)

## 2019-07-29 LAB — CMP (CANCER CENTER ONLY)
ALT: 19 U/L (ref 0–44)
AST: 17 U/L (ref 15–41)
Albumin: 3.8 g/dL (ref 3.5–5.0)
Alkaline Phosphatase: 56 U/L (ref 38–126)
Anion gap: 11 (ref 5–15)
BUN: 12 mg/dL (ref 8–23)
CO2: 22 mmol/L (ref 22–32)
Calcium: 8.8 mg/dL — ABNORMAL LOW (ref 8.9–10.3)
Chloride: 108 mmol/L (ref 98–111)
Creatinine: 0.78 mg/dL (ref 0.44–1.00)
GFR, Est AFR Am: >60 mL/min (ref >60)
GFR, Estimated: >60 mL/min (ref >60)
Glucose, Bld: 145 mg/dL — ABNORMAL HIGH (ref 70–99)
Potassium: 3.6 mmol/L (ref 3.5–5.1)
Sodium: 141 mmol/L (ref 135–145)
Total Bilirubin: 0.3 mg/dL (ref 0.3–1.2)
Total Protein: 6.2 g/dL — ABNORMAL LOW (ref 6.5–8.1)

## 2019-07-29 LAB — RESEARCH LABS

## 2019-07-29 MED ORDER — HEPARIN SOD (PORK) LOCK FLUSH 100 UNIT/ML IV SOLN
500.0000 [IU] | Freq: Once | INTRAVENOUS | Status: AC | PRN
  Administered 2019-07-29: 12:00:00 500 [IU]
  Filled 2019-07-29: qty 5

## 2019-07-29 MED ORDER — SODIUM CHLORIDE 0.9 % IV SOLN
80.0000 mg/m2 | Freq: Once | INTRAVENOUS | Status: AC
  Administered 2019-07-29: 10:00:00 138 mg via INTRAVENOUS
  Filled 2019-07-29: qty 23

## 2019-07-29 MED ORDER — SODIUM CHLORIDE 0.9 % IV SOLN
Freq: Once | INTRAVENOUS | Status: AC
  Filled 2019-07-29: qty 250

## 2019-07-29 MED ORDER — ACETAMINOPHEN 325 MG PO TABS: 650.0000 mg | ORAL_TABLET | Freq: Once | ORAL | Status: DC

## 2019-07-29 MED ORDER — DIPHENHYDRAMINE HCL 50 MG/ML IJ SOLN
INTRAMUSCULAR | Status: AC
  Filled 2019-07-29: qty 1

## 2019-07-29 MED ORDER — ALTEPLASE 2 MG IJ SOLR
2.0000 mg | Freq: Once | INTRAMUSCULAR | Status: AC
  Administered 2019-07-29: 08:00:00 2 mg
  Filled 2019-07-29: qty 2

## 2019-07-29 MED ORDER — SODIUM CHLORIDE 0.9% FLUSH
10.0000 mL | INTRAVENOUS | Status: DC | PRN
  Administered 2019-07-29: 12:00:00 10 mL
  Filled 2019-07-29: qty 10

## 2019-07-29 MED ORDER — FAMOTIDINE IN NACL 20-0.9 MG/50ML-% IV SOLN
INTRAVENOUS | Status: AC
  Filled 2019-07-29: qty 50

## 2019-07-29 MED ORDER — SODIUM CHLORIDE 0.9% FLUSH
10.0000 mL | Freq: Once | INTRAVENOUS | Status: AC
  Administered 2019-07-29: 10 mL
  Filled 2019-07-29: qty 10

## 2019-07-29 MED ORDER — DEXAMETHASONE SODIUM PHOSPHATE 10 MG/ML IJ SOLN
10.0000 mg | Freq: Once | INTRAMUSCULAR | Status: AC
  Administered 2019-07-29: 10 mg via INTRAVENOUS

## 2019-07-29 MED ORDER — DEXAMETHASONE SODIUM PHOSPHATE 10 MG/ML IJ SOLN
INTRAMUSCULAR | Status: AC
  Filled 2019-07-29: qty 1

## 2019-07-29 MED ORDER — FAMOTIDINE IN NACL 20-0.9 MG/50ML-% IV SOLN
20.0000 mg | Freq: Once | INTRAVENOUS | Status: AC
  Administered 2019-07-29: 10:00:00 20 mg via INTRAVENOUS

## 2019-07-29 MED ORDER — DIPHENHYDRAMINE HCL 50 MG/ML IJ SOLN
25.0000 mg | Freq: Once | INTRAMUSCULAR | Status: AC
  Administered 2019-07-29: 25 mg via INTRAVENOUS

## 2019-07-29 NOTE — Progress Notes (Signed)
[id number], A PROSPECTIVE OBSERVATIONAL COHORT STUDY TO DEVELOP A PREDICTIVE MODEL OFTAXANE-INDUCED PERIPHERAL NEUROPATHY IN CANCER PATIENTS.  12WEEKS VISIT: Patient came into clinic by herself today for her scheduled visits.  PROs; Questionnaires were given to patient to complete in clinic prior to lab appointment. Collected questionnaires and checked for completeness and accuracy.  Labs; Optional research labs were collected per protocol.  History of Falls; Not required this visit.  Solicited Neuropathy Events; Patient reports "needle" sensation to bilateral big toes. Grade 1 peripheral sensory neuropathy reported on CRF by Dr. Lindi Adie. Treatment;  Weekly paclitaxeltreatment #12 planned for today with no delays or dose modifications since starting paclitaxel.  Assessment for Interventions for CIPN; Reviewed with patient and CRFs completed. Patientis not using any interventions for neuropathy. Patient reports to have compression socks, but has not been using them. Patient denies any changes in her vitamin/supplements since her baseline visit. Patient does report starting carvedilol and entresto, prescribed by her cardiologist.  Neuropen Assessment; Completed per protocol by this certified research RN with assistance Ellie Lunch, RN for recording.  T[place] Assessment; Completed per protocol by this certified research RN with assistance ofStacey Phelps RN for timing and recording.  Timed Get Up and Go Test; Not required this visit.  Physician Assessments; Treatment Burden form completed by Dr. Lindi Adie. Plan; Informed patient of next study assessmentsfor week 24 dueon approximately May 5th, 2021. Patient verbalized understanding.  Patient was thanked for her time and continued support of study. All her questions answered to her satisfaction and patient was encouraged to call with further questions/concerns in the future.  Maxwell Marion, RN, BSN, [facility] Clinical [facility] 07/29/2019 9:56  AM

## 2019-07-29 NOTE — Progress Notes (Signed)
Nutrition Follow-up:  Patient with right breast cancer.  S/p right lumpectomy on 04/15/19.  Patient currently on adjuvant chemotherapy of taxol and herceptin (currentoly on hold due to cardiac function).    Met with patient during infusion.  Patient reports appetite maybe a little bit better.  Snacking mostly and consuming 1 good meal per day.  Drinks premier protein shakes but not everyday.     Medications: reviewed  Labs: reviewed  Anthropometrics:   Weight 140 lb 4.8 oz today stable from 141 lb on 1/20.     NUTRITION DIAGNOSIS: Inadequate oral intake stable   INTERVENTION:  Encouraged snacking/small frequent meals.  Discussed nutrient dense snacking food options.   Patient to contact RD if needed in the future.  Contact information provided to patient.   NEXT VISIT: no follow-up, patient to contact RD as needed  Elisabel Hanover B. Zenia Resides, [place], [place] Registered Dietitian [phone] (pager)

## 2019-07-29 NOTE — Patient Instructions (Signed)
[facility] Discharge Instructions for Patients Receiving Chemotherapy  Today you received the following chemotherapy agents:  Taxol.  To help prevent nausea and vomiting after your treatment, we encourage you to take your nausea medication as directed.   If you develop nausea and vomiting that is not controlled by your nausea medication, call the clinic.   BELOW ARE SYMPTOMS THAT SHOULD BE REPORTED IMMEDIATELY:  *FEVER GREATER THAN 100.5 F  *CHILLS WITH OR WITHOUT FEVER  NAUSEA AND VOMITING THAT IS NOT CONTROLLED WITH YOUR NAUSEA MEDICATION  *UNUSUAL SHORTNESS OF BREATH  *UNUSUAL BRUISING OR BLEEDING  TENDERNESS IN MOUTH AND THROAT WITH OR WITHOUT PRESENCE OF ULCERS  *URINARY PROBLEMS  *BOWEL PROBLEMS  UNUSUAL RASH Items with * indicate a potential emergency and should be followed up as soon as possible.  Feel free to call the clinic should you have any questions or concerns. The clinic phone number is [phone].  Please show the CHEMO ALERT CARD at check-in to the Emergency Department and triage nurse.   

## 2019-07-29 NOTE — Assessment & Plan Note (Signed)
Routine screening mammogram detected a 1.3cm mass in the right breast, 9 o'clock position, no axillary adenopathy. 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 T1c N0 stage Ia  Recommendations: 1. 04/15/19: Breast conserving surgery: Right lumpectomy [name]): IDC, grade 2, 1.3cm, with intermediate grade DCIS, clear margins, and 1 right axillary lymph node negative 2.Adj chemo with Taxol-Herceptin foll by Herceptin maintenance for 1 yearstarted 05/13/2019 3. Adjuvant radiation therapy followed by 4. Adjuvant antiestrogen therapy  Enrolled inSWOG 1714 trials.  ------------------------------------------------------------------------------------------------------------------------- Current treatment: Cycle12Taxol Herceptin EchoEF 55 to 60%04/15/2019  Chemo toxicities: 1. Grade 1 peripheral neuropathy. This is stable 2.  Decline in cardiac ejection fraction: Echocardiogram 07/20/2019: EF 30 to 35% (declined from 55 to 60% in October) Seen by Dr.McLean. Cardiac meds adjusted. WE hope her EF will improve and we can resume Herceptin at a later time. 3. Depression: On Anti depressants 4. Elevated sugars:   Patient has been referred for adjuvant radiation therapy. Return to clinic after radiation to begin antiestrogen therapy. She will continue anti-HER-2 therapy after the echocardiograms return back to normal.

## 2019-07-30 ENCOUNTER — Encounter: Payer: Self-pay | Admitting: *Deleted

## 2019-07-31 ENCOUNTER — Encounter: Payer: Self-pay | Admitting: Internal Medicine

## 2019-08-04 ENCOUNTER — Ambulatory Visit: Payer: Medicare Other | Admitting: Internal Medicine

## 2019-08-05 ENCOUNTER — Ambulatory Visit: Payer: Self-pay | Admitting: Family

## 2019-08-10 ENCOUNTER — Ambulatory Visit (HOSPITAL_COMMUNITY)
Admission: RE | Admit: 2019-08-10 | Discharge: 2019-08-10 | Disposition: A | Discharge status: Home / Self Care | Payer: Medicare Other | Source: Ambulatory Visit | Attending: Cardiology | Admitting: Cardiology

## 2019-08-10 ENCOUNTER — Other Ambulatory Visit: Payer: Self-pay

## 2019-08-10 DIAGNOSIS — C50411 Malignant neoplasm of upper-outer quadrant of right female breast: Secondary | ICD-10-CM | POA: Diagnosis not present

## 2019-08-10 DIAGNOSIS — Z170 Estrogen receptor positive status [ER+]: Secondary | ICD-10-CM | POA: Diagnosis not present

## 2019-08-10 LAB — BASIC METABOLIC PANEL
Anion gap: 12 (ref 5–15)
BUN: 8 mg/dL (ref 8–23)
CO2: 24 mmol/L (ref 22–32)
Calcium: 9.2 mg/dL (ref 8.9–10.3)
Chloride: 108 mmol/L (ref 98–111)
Creatinine, Ser: 0.67 mg/dL (ref 0.44–1.00)
GFR calc Af Amer: >60 mL/min (ref >60)
GFR calc non Af Amer: >60 mL/min (ref >60)
Glucose, Bld: 149 mg/dL — ABNORMAL HIGH (ref 70–99)
Potassium: 3.9 mmol/L (ref 3.5–5.1)
Sodium: 144 mmol/L (ref 135–145)

## 2019-08-11 ENCOUNTER — Other Ambulatory Visit: Payer: Self-pay

## 2019-08-11 ENCOUNTER — Encounter: Payer: Self-pay | Admitting: Family

## 2019-08-11 ENCOUNTER — Ambulatory Visit (INDEPENDENT_AMBULATORY_CARE_PROVIDER_SITE_OTHER): Payer: Medicare Other | Admitting: Family

## 2019-08-11 VITALS — BP 110/80 | HR 90 | Temp 98.2°F | Ht 62.0 in | Wt 145.8 lb

## 2019-08-11 DIAGNOSIS — F339 Major depressive disorder, recurrent, unspecified: Secondary | ICD-10-CM | POA: Diagnosis not present

## 2019-08-11 DIAGNOSIS — J019 Acute sinusitis, unspecified: Secondary | ICD-10-CM

## 2019-08-11 DIAGNOSIS — E1165 Type 2 diabetes mellitus with hyperglycemia: Secondary | ICD-10-CM | POA: Diagnosis not present

## 2019-08-11 DIAGNOSIS — I10 Essential (primary) hypertension: Secondary | ICD-10-CM | POA: Diagnosis not present

## 2019-08-11 DIAGNOSIS — Z794 Long term (current) use of insulin: Secondary | ICD-10-CM

## 2019-08-11 DIAGNOSIS — I447 Left bundle-branch block, unspecified: Secondary | ICD-10-CM

## 2019-08-11 MED ORDER — DOXYCYCLINE HYCLATE 100 MG PO TABS: 100.0000 mg | ORAL_TABLET | Freq: Two times a day (BID) | ORAL | 0 refills | Status: DC

## 2019-08-11 NOTE — Progress Notes (Signed)
Margaret Norman is a 69 y.o. female with the following history as recorded in EpicCare:  Patient Active Problem List   Diagnosis Date Noted  . Port-A-Cath in place 06/10/2019  . Breast cancer ([place]) 04/15/2019  . Second degree AV block   . Malignant neoplasm of upper-outer quadrant of right breast in female, estrogen receptor positive ([place]) 03/25/2019  . Thyromegaly 01/10/2018  . Overweight (BMI 25.0-29.9) 08/13/2017  . Upper respiratory tract infection 07/30/2017  . Type 2 diabetes mellitus with hyperglycemia, with long-term current use of insulin ([place]) 03/19/2017  . Thiamine deficiency 01/04/2017  . 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 Deficiency anemia 04/30/2016  . Routine general medical examination at a health care facility 04/27/2015  . Abdominal wall hernia 04/26/2015  . Family history of hemochromatosis 09/13/2014  . Acute maxillary sinusitis 05/14/2014  . Personal history of colonic polyps 03/10/2014  . 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 Lumbosacral spondylosis without myelopathy 05/12/2013  . Insomnia, persistent 02/04/2013  . Obesity (BMI 30-39.9) 02/04/2013  . Unspecified asthma, with exacerbation 09/22/2012  . Visit for screening mammogram 07/04/2012  . Nephrolithiasis   . HTN (hypertension)   . Gallstones   . LBBB (left bundle branch block)   . Leukocytosis 12/20/2008  . Hyperlipidemia with target LDL less than 100 09/04/2007  . Chronic depression 09/04/2007  . GERD 09/04/2007    Current Outpatient Medications  Medication Sig Dispense Refill  . acetaminophen (TYLENOL) 500 MG tablet Take 1,000 mg by mouth every 6 (six) hours as needed for moderate pain.    Marland Kitchen aspirin 81 MG EC tablet Take 81 mg by mouth daily.      . Blood Glucose Monitoring Suppl (ONETOUCH VERIO FLEX SYSTEM) W/DEVICE KIT 1 kit by Does not apply route 3 (three) times daily. 1 kit 1  . buPROPion (WELLBUTRIN XL) 150 MG 24 hr tablet Take 1 tablet (150 mg total) by mouth every  morning. 90 tablet 3  . carvedilol (COREG) 3.125 MG tablet Take 1 tablet (3.125 mg total) by mouth 2 (two) times daily. 60 tablet 3  . Cyanocobalamin (VITAMIN B12 PO) Take 2,500 mcg by mouth daily.    . empagliflozin (JARDIANCE) 25 MG TABS tablet Take 25 mg by mouth daily. 30 tablet 11  . fluticasone (FLONASE) 50 MCG/ACT nasal spray Place 2 sprays into both nostrils daily. 16 g 6  . glucose blood (ONETOUCH VERIO) test strip Test three times daily as directed. DX: E11.9 300 each 3  . Insulin Pen Needle (B-D ULTRAFINE III SHORT PEN) 31G X 8 MM MISC USE DAILY WITH LANTUS  INSULIN 90 each 1  . LANTUS SOLOSTAR 100 UNIT/ML Solostar Pen INJECT SUBCUTANEOUSLY 30  UNITS AT BEDTIME 30 mL 3  . lidocaine-prilocaine (EMLA) cream Apply to affected area once 30 g 3  . metFORMIN (GLUCOPHAGE) 1000 MG tablet TAKE 1 TABLET BY MOUTH  TWICE DAILY WITH MEALS 180 tablet 3  . omeprazole (PRILOSEC) 40 MG capsule TAKE 1 CAPSULE BY MOUTH  DAILY 90 capsule 3  . ondansetron (ZOFRAN) 8 MG tablet Take 1 tablet (8 mg total) by mouth 2 (two) times daily as needed (Nausea or vomiting). 30 tablet 1  . ONE TOUCH LANCETS MISC Test three times daily as directed. DX: E11.9 300 each 3  . Probiotic Product (PROBIOTIC ADVANCED PO) Take 1 tablet by mouth at bedtime.     . prochlorperazine (COMPAZINE) 10 MG tablet Take 1 tablet (10 mg total) by mouth every 6 (six) hours as needed (Nausea or  vomiting). 30 tablet 1  . pyridOXINE (VITAMIN B-6) 100 MG tablet Take 100 mg by mouth daily.     . rosuvastatin (CRESTOR) 10 MG tablet Take 1 tablet (10 mg total) by mouth daily. 90 tablet 3  . sacubitril-valsartan (ENTRESTO) 24-26 MG Take 1 tablet by mouth 2 (two) times daily. 60 tablet 5  . sertraline (ZOLOFT) 100 MG tablet Take 1 tablet (100 mg total) by mouth daily. 90 tablet 3  . thiamine 100 MG tablet Take by mouth.    . TRULICITY 3 IZ/1.2WP SOPN INJECT 3 MG INTO THE SKIN  WEEKLY 12 pen 2  . Vitamin E 180 MG CAPS Take 180 mg by mouth daily.    Marland Kitchen  doxycycline (VIBRA-TABS) 100 MG tablet Take 1 tablet (100 mg total) by mouth 2 (two) times daily. 20 tablet 0   No current facility-administered medications for this visit.    Allergies: Invokana [canagliflozin] and Amoxicillin  Past Medical History:  Diagnosis Date  . Arthritis   . Chest pain    Nuclear, adenosine,  December, 2013, low risk nuclear scan with small, moderate in intensity, fixed anteroseptal defect. This is possibly related to an LBBB versus small prior infarct. No ischemia  . Depression   . Gallstones 11-06  . GERD (gastroesophageal reflux disease)   . History of kidney stones   . HTN (hypertension)   . Hyperlipidemia   . Kidney mass 01/28/2014  . LBBB (left bundle branch block)   . Nephrolithiasis   . Pneumonia   . 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 Type II or unspecified type diabetes mellitus without mention of complication, not stated as uncontrolled     Past Surgical History:  Procedure Laterality Date  . APPENDECTOMY    . BREAST LUMPECTOMY WITH RADIOACTIVE SEED AND SENTINEL LYMPH NODE BIOPSY Right 04/15/2019   Procedure: RIGHT BREAST PARTIAL MASTECTOMY WITH RADIOACTIVE SEED AND SENTINEL LYMPH NODE BIOPSY;  Surgeon: Coralie Keens, MD;  Location: [facility];  Service: General;  Laterality: Right;  . CESAREAN SECTION     x2 ? w/appy  . CHOLECYSTECTOMY    . ESOPHAGOGASTRODUODENOSCOPY    . HERNIA REPAIR    . KNEE ARTHROSCOPY Right 11/2005  . LIVER BIOPSY    . PANCREATIC CYST EXCISION    . PORTACATH PLACEMENT Left 04/15/2019   Procedure: INSERTION PORT-A-CATH WITH ULTRASOUND;  Surgeon: Coralie Keens, MD;  Location: [facility];  Service: General;  Laterality: Left;  . SPLENECTOMY    . TONSILLECTOMY AND ADENOIDECTOMY      Family History  Problem Relation Age of Onset  . Liver disease Mother   . Dementia Mother   . Diabetes Mother        borderline  . Coronary artery disease Father   . Heart attack Father   . Hypertension Father   . Heart  disease Father   . Cancer Other        leukemia  . Stroke Maternal Grandfather   . Hyperlipidemia Brother        Amyloidosis  . Diabetes Maternal Grandmother   . Cancer Paternal Uncle        unknown  . Heart attack Paternal Grandmother   . Heart attack Paternal Uncle   . Hypertension Brother   . Diabetes Daughter        borderline  . Colon cancer Neg Hx     Social History   Tobacco Use  . Smoking status: Never Smoker  . Smokeless tobacco: Never Used  Substance Use  Topics  . Alcohol use: No    Subjective:  6 month follow-up; since her last OV in August, she has been diagnosed with breast cancer; very optimistic about her response- is cancer free/ finished chemo last week and starts radiation tomorrow; has had good family support; feels that current dosage of Wellbutrin and Zoloft are fine; Was started on Entresto and Olmesartan was stopped; actually feeling much better with this change; scheduled to see cardiology/ pharmacy in follow-up next week; Scheduled to see her endocrinologist next month;  Is concerned that she has a sinus infection; + facial pressure/ frontal headache; thick mucus; using Flonase and saline rinse; has tried using home humidifier as well;   Objective:  Vitals:   08/11/19 0958  BP: 110/80  Pulse: 90  Temp: 98.2 F (36.8 C)  TempSrc: Oral  SpO2: 97%  Weight: 145 lb 12.8 oz (66.1 kg)  Height: 5' 2"  (1.575 m)    General: Well developed, well nourished, in no acute distress  Skin : Warm and dry.  Head: Normocephalic and atraumatic  Eyes: Sclera and conjunctiva clear; pupils round and reactive to light; extraocular movements intact  Ears: External normal; canals clear; tympanic membranes congested bilaterally Oropharynx: Pink, supple. No suspicious lesions  Neck: Supple without thyromegaly, adenopathy  Lungs: Respirations unlabored;  Neurologic: Alert and oriented; speech intact; face symmetrical; moves all extremities well; CNII-XII intact without  focal deficit   Assessment:  1. Essential hypertension   2. LBBB (left bundle branch block)   3. Type 2 diabetes mellitus with hyperglycemia, with long-term current use of insulin ([place])   4. Major depression, recurrent, chronic ([place])   5. Acute sinusitis, recurrence not specified, unspecified location     Plan:  1. & 2. Stable; feeling better with changes made by cardiology; keep planned follow-up there next week;  3. Keep planned follow-up with endocrinology; 4. Stable; continue same medications; 5. Rx for Doxycycline 100 mg bid x 10 days;   This visit occurred during the SARS-CoV-2 public health emergency.  Safety protocols were in place, including screening questions prior to the visit, additional usage of staff PPE, and extensive cleaning of exam room while observing appropriate contact time as indicated for disinfecting solutions.     Return in about 6 months (around 02/08/2020).  No orders of the defined types were placed in this encounter.   Requested Prescriptions   Signed Prescriptions Disp Refills  . doxycycline (VIBRA-TABS) 100 MG tablet 20 tablet 0    Sig: Take 1 tablet (100 mg total) by mouth 2 (two) times daily.

## 2019-08-12 ENCOUNTER — Ambulatory Visit
Admission: RE | Admit: 2019-08-12 | Discharge: 2019-08-12 | Disposition: A | Discharge status: Home / Self Care | Payer: Medicare Other | Source: Ambulatory Visit | Attending: Radiation Oncology | Admitting: Radiation Oncology

## 2019-08-12 ENCOUNTER — Other Ambulatory Visit: Payer: Self-pay

## 2019-08-12 ENCOUNTER — Encounter: Payer: Self-pay | Admitting: Radiation Oncology

## 2019-08-12 ENCOUNTER — Inpatient Hospital Stay (HOSPITAL_BASED_OUTPATIENT_CLINIC_OR_DEPARTMENT_OTHER): Payer: Medicare Other | Admitting: Medical

## 2019-08-12 ENCOUNTER — Encounter: Payer: Self-pay | Admitting: *Deleted

## 2019-08-12 VITALS — BP 143/71 | HR 89 | Temp 97.8°F | Resp 18 | Ht 62.0 in | Wt 147.0 lb

## 2019-08-12 VITALS — BP 142/72 | HR 92 | Temp 97.8°F | Resp 19 | Ht 62.0 in | Wt 147.4 lb

## 2019-08-12 DIAGNOSIS — R21 Rash and other nonspecific skin eruption: Secondary | ICD-10-CM | POA: Diagnosis not present

## 2019-08-12 DIAGNOSIS — G629 Polyneuropathy, unspecified: Secondary | ICD-10-CM | POA: Insufficient documentation

## 2019-08-12 DIAGNOSIS — Z7982 Long term (current) use of aspirin: Secondary | ICD-10-CM | POA: Insufficient documentation

## 2019-08-12 DIAGNOSIS — C50911 Malignant neoplasm of unspecified site of right female breast: Secondary | ICD-10-CM | POA: Diagnosis not present

## 2019-08-12 DIAGNOSIS — Z79899 Other long term (current) drug therapy: Secondary | ICD-10-CM | POA: Diagnosis not present

## 2019-08-12 DIAGNOSIS — Z170 Estrogen receptor positive status [ER+]: Secondary | ICD-10-CM

## 2019-08-12 DIAGNOSIS — R110 Nausea: Secondary | ICD-10-CM | POA: Insufficient documentation

## 2019-08-12 DIAGNOSIS — R197 Diarrhea, unspecified: Secondary | ICD-10-CM | POA: Diagnosis not present

## 2019-08-12 DIAGNOSIS — Z9221 Personal history of antineoplastic chemotherapy: Secondary | ICD-10-CM | POA: Diagnosis not present

## 2019-08-12 DIAGNOSIS — Z923 Personal history of irradiation: Secondary | ICD-10-CM | POA: Insufficient documentation

## 2019-08-12 DIAGNOSIS — R630 Anorexia: Secondary | ICD-10-CM | POA: Diagnosis not present

## 2019-08-12 DIAGNOSIS — Z510 Encounter for antineoplastic radiation therapy: Secondary | ICD-10-CM | POA: Diagnosis not present

## 2019-08-12 DIAGNOSIS — C50411 Malignant neoplasm of upper-outer quadrant of right female breast: Secondary | ICD-10-CM

## 2019-08-12 DIAGNOSIS — Z9889 Other specified postprocedural states: Secondary | ICD-10-CM | POA: Diagnosis not present

## 2019-08-12 NOTE — Patient Instructions (Signed)

## 2019-08-12 NOTE — Progress Notes (Signed)
Ms. Chadick presents to the clinic today with bilateral "sunburn-like" rash over her distal upper extremities.  Her only change is that she has recently been started on Entresto and carvedilol.  Her rash developed after beginning these 2 medications.  Review of adverse side effects of these 2 medications indicate a possibility of a skin rash.  She was told to use Eucerin cream with and hydrocortisone cream as needed.  She was also told to use a sunblock when she is outside.  She expresses understanding and agreement with this.  She is told to follow-up with her primary care provider or cardiologist should this rash continued.  Sandi Mealy, MHS, PA-C Physician Assistant

## 2019-08-12 NOTE — Progress Notes (Signed)
Location of Breast Cancer: Malignant neoplasm of upper-outer quadrant of right breast in female, estrogen receptor positive ([place])  Histology per Pathology Report: 04/15/19: FINAL MICROSCOPIC DIAGNOSIS:   A. 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  - Ductal carcinoma in situ with calcifications, intermediate grade.  - Invasive carcinoma is focally 0.1 cm to the anterior margin of  specimen A.  - See oncology table below   B. LYMPH NODE, RIGHT AXILLARY, SENTINEL, BIOPSY:  - There is no evidence of carcinoma in 1 of 1 lymph node (0/1).   ONCOLOGY TABLE:   INVASIVE CARCINOMA OF THE BREAST: Resection   Procedure: Lumpectomy  Specimen Laterality: Right  Tumor Size: 1.3 cm  Histologic Type: Ductal   Receptor Status: ER (100%); PR (3%); Ki67 (35%); and HER-2 (positive, 3+)  Did patient present with symptoms (if so, please note symptoms) or was this found on screening mammography?: She had an abnormal mass found on mammography and ultrasound. This measured approximately 1.3 cm by 1 cm at the 9 o'clock position 4-5 cm from the right nipple.   Past/Anticipated interventions by surgeon, if any: 04/15/19: Procedure(s): RIGHT BREAST PARTIAL MASTECTOMY WITH RADIOACTIVE SEED AND DEEP RIGHT AXILLARY SENTINEL LYMPH NODE BIOPSY INSERTION PORT-A-CATH WITH ULTRASOUND INJECTION OF BLUE DYE  Surgeon(s): Coralie Keens, MD  Past/Anticipated interventions by medical oncology, if any: Chemotherapy Per Dr. Lindi Adie 07/29/19: ASSESSMENT & PLAN:  Malignant neoplasm of upper-outer quadrant of right breast in female, estrogen receptor positive ([place]) Routine screening mammogram detected a 1.3cm mass in the right breast, 9 o'clock position, no axillary adenopathy. 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 T1c N0 stage Ia  Recommendations: 1. 04/15/19: Breast conserving surgery: Right lumpectomy [name]): IDC,  grade 2, 1.3cm, with intermediate grade DCIS, clear margins, and 1 right axillary lymph node negative 2.Adj chemo with Taxol-Herceptin foll by Herceptin maintenance for 1 yearstarted 05/13/2019 3. Adjuvant radiation therapy followed by 4. Adjuvant antiestrogen therapy  Enrolled in[facility]OG 1714 trials.  ------------------------------------------------------------------------------------------------------------------------- Current treatment: Cycle12Taxol Herceptin EchoEF 55 to 60%04/15/2019  Chemo toxicities: 1.Grade 1 peripheral neuropathy. This is stable 2.Decline in cardiac ejection fraction: Echocardiogram 07/20/2019: EF 30 to 35% (declined from 55 to 60% in October) Seen by Dr.McLean. Cardiac meds adjusted. WE hope her EF will improve and we can resume Herceptin at a later time. 3. Depression: On Anti depressants 4. Elevated sugars:   Patient has been referred for adjuvant radiation therapy. Return to clinic after radiation to begin antiestrogen therapy. She will continue anti-HER-2 therapy after the echocardiograms return back to normal.  Lymphedema issues, if any: Pt denies s/s lymphedema  Pain issues, if any: Pt denies c/o pain.   SAFETY ISSUES:  Prior radiation? no  Pacemaker/ICD? no  Possible current pregnancy?no  Is the patient on methotrexate? no  Current Complaints / other details:  [name] presents today for f/u new with Dr. Sondra Come for Radiation Oncology. Pt reports bilateral hands and forearms feel sunburned. Skin of bilat hands and forearms are noticeably red and dry. Pt reports that she has recently started 2 new medications from cardiologist:  Coreg and Entresto. Strongly encouraged pt to contact cards office and alert them to possible reaction. Loma Sousa, RN BSN    BP (!) 143/71 (BP Location: Left Arm)   Pulse 89   Temp 97.8 F (36.6 C) (Temporal)   Resp 18   Ht 5' 2"  (1.575 m)   Wt 147 lb (66.7  kg)   SpO2 97%   BMI 26.89 kg/m    Wt Readings from Last 3 Encounters:  08/12/19 147 lb (66.7 kg)  08/11/19 145 lb 12.8 oz (66.1 kg)  07/29/19 140 lb 4.8 oz (63.6 kg)       Loma Sousa, RN 08/12/2019,12:50 PM

## 2019-08-12 NOTE — Patient Instructions (Signed)
Coronavirus (COVID-19) Are you at risk?  Are you at risk for the Coronavirus (COVID-19)?  To be considered HIGH RISK for Coronavirus (COVID-19), you have to meet the following criteria:  . Traveled to [place], [place], [place], [place] or [place]; or in the [place] to [place], [place], [place], or [place]; and have fever, cough, and shortness of breath within the last 2 weeks of travel OR . Been in close contact with a person diagnosed with COVID-19 within the last 2 weeks and have fever, cough, and shortness of breath . IF YOU DO NOT MEET THESE CRITERIA, YOU ARE CONSIDERED LOW RISK FOR COVID-19.  What to do if you are HIGH RISK for COVID-19?  . If you are having a medical emergency, call 911. . Seek medical care right away. Before you go to a doctor's office, urgent care or emergency department, call ahead and tell them about your recent travel, contact with someone diagnosed with COVID-19, and your symptoms. You should receive instructions from your physician's office regarding next steps of care.  . When you arrive at healthcare provider, tell the healthcare staff immediately you have returned from visiting [place], [place], [place], [place] or [place]; or traveled in the [place] to [place], [place], [place], or [place]; in the last two weeks or you have been in close contact with a person diagnosed with COVID-19 in the last 2 weeks.   . Tell the health care staff about your symptoms: fever, cough and shortness of breath. . After you have been seen by a medical provider, you will be either: o Tested for (COVID-19) and discharged home on quarantine except to seek medical care if symptoms worsen, and asked to  - Stay home and avoid contact with others until you get your results (4-5 days)  - Avoid travel on public transportation if possible (such as bus, train, or airplane) or o Sent to the Emergency Department by EMS for evaluation, COVID-19 testing, and possible  admission depending on your condition and test results.  What to do if you are LOW RISK for COVID-19?  Reduce your risk of any infection by using the same precautions used for avoiding the common cold or flu:  . Wash your hands often with soap and warm water for at least 20 seconds.  If soap and water are not readily available, use an alcohol-based hand sanitizer with at least 60% alcohol.  . If coughing or sneezing, cover your mouth and nose by coughing or sneezing into the elbow areas of your shirt or coat, into a tissue or into your sleeve (not your hands). . Avoid shaking hands with others and consider head nods or verbal greetings only. . Avoid touching your eyes, nose, or mouth with unwashed hands.  . Avoid close contact with people who are sick. . Avoid places or events with large numbers of people in one location, like concerts or sporting events. . Carefully consider travel plans you have or are making. . If you are planning any travel outside or inside the [place], visit the CDC's Travelers' Health webpage for the latest health notices. . If you have some symptoms but not all symptoms, continue to monitor at home and seek medical attention if your symptoms worsen. . If you are having a medical emergency, call 911.   ADDITIONAL HEALTHCARE OPTIONS FOR PATIENTS  [facility] / e-Visit: [email or web address]         [facility]: [phone]  [facility]: [phone]                   [facility]: [phone]   

## 2019-08-12 NOTE — Progress Notes (Signed)
  Radiation Oncology         [phone] ________________________________  Name: Margaret Norman MRN: [id number]  Date: 08/12/2019  DOB: Jul 29, 1950  SIMULATION AND TREATMENT PLANNING NOTE    ICD-10-CM   1. Malignant neoplasm of upper-outer quadrant of right breast in female, estrogen receptor positive ([place])  C50.411    Z17.0     DIAGNOSIS: Stage IA (pT1c, pN0, cM0) Right Breast UOQ, Invasive Ductal Carcinoma with DCIS, ER+ / PR+ / Her2+, Grade 2   NARRATIVE:  The patient was brought to the [facility].  Identity was confirmed.  All relevant records and images related to the planned course of therapy were reviewed.  The patient freely provided informed written consent to proceed with treatment after reviewing the details related to the planned course of therapy. The consent form was witnessed and verified by the simulation staff.  Then, the patient was set-up in a stable reproducible  supine position for radiation therapy.  CT images were obtained.  Surface markings were placed.  The CT images were loaded into the planning software.  Then the target and avoidance structures were contoured.  Treatment planning then occurred.  The radiation prescription was entered and confirmed.  Then, I designed and supervised the construction of a total of 5 medically necessary complex treatment devices.  I have requested : 3D Simulation  I have requested a DVH of the following structures: heart, lungs, lumpectomy cavity..  I have ordered:dose calc.  PLAN:  The patient will receive 40.05 Gy in 15 fractions followed by a boost to the lumpectomy cavity of 12 Gy in 6 fractions.   Optical Surface Tracking Plan:  Since intensity modulated radiotherapy (IMRT) and 3D conformal radiation treatment methods are predicated on accurate and precise positioning for treatment, intrafraction motion monitoring is medically necessary to ensure accurate and safe treatment delivery.  The ability to quantify  intrafraction motion without excessive ionizing radiation dose can only be performed with optical surface tracking. Accordingly, surface imaging offers the opportunity to obtain 3D measurements of patient position throughout IMRT and 3D treatments without excessive radiation exposure.  I am ordering optical surface tracking for this patient's upcoming course of radiotherapy. ________________________________     -----------------------------------  Blair Promise, PhD, MD  This document serves as a record of services personally performed by Gery Pray, MD. It was created on his behalf by Clerance Lav, a trained medical scribe. The creation of this record is based on the scribe's personal observations and the provider's statements to them. This document has been checked and approved by the attending provider.

## 2019-08-12 NOTE — Progress Notes (Signed)
Radiation Oncology         (336) [phone] ________________________________  Name: Margaret Norman MRN: [id number]  Date: 08/12/2019  DOB: 13-Dec-1950  Re-Evaluation Note  CC: [name], FNP  Nicholas Lose, MD    ICD-10-CM   1. Malignant neoplasm of upper-outer quadrant of right breast in female, estrogen receptor positive ([place])  C50.411    Z17.0     Diagnosis: Stage IA (pT1c, pN0, cM0) Right Breast UOQ, Invasive Ductal Carcinoma with DCIS, ER+ / PR+ / Her2+, Grade 2  Narrative:  The patient returns today to discuss radiation treatment options. She was seen in consultation on 04/01/2019. At that time, the patient was to proceed with surgery, re-evaluation for radiation therapy, and adjuvant chemotherapy.  The patient underwent right breast partial mastectomy with sentinel node biopsy on 04/15/2019 performed by Dr. [name]. Pathology from the procedure revealed grade 2-3 invasive ductal carcinoma and intermediate grade ductal carcinoma in situ with calcifications. A single right axillary lymph node was biopsied and was negative for carcinoma.  Within the right lumpectomy specimen the patient was found to have a 1.3 cm invasive ductal carcinoma.  There was also associated ductal carcinoma in situ with calcifications, intermediate grade.  The invasive tumor was within 1 mm of the anterior margin.  Of note, the patient had an echocardiogram done on 04/15/2019 for abnormal EKG. EF was 55-60%.  The patient followed up with Dr. Lindi Adie on 04/22/2019, during which time they discussed adjuvant chemotherapy with Taxol-Herceptin followed by Herceptin maintenance for one year. She began chemotherapy on 05/13/2019. She did experience some diarrhea, mild nausea, decreased appetite, grade 1 intermittent peripheral neuropathy, vision changes, and decline in cardiac ejection fraction (Echocardiogram on 07/20/2019 showed EG of 30-35%). Further Herceptin is being held until the echocardiogram  improves.  Of note, the patient has been enrolled in the [facility] 1714 trials.  On review of systems, the patient reports "feeling sunburned" on bilateral hands and forearms after recently starting Coreg and Entresto. She denies breast pain and any other symptoms.  She will be seen by symptom management in medical oncology later today concerning her rash along the forearm and hand area.   Allergies:  is allergic to invokana [canagliflozin] and amoxicillin.  Meds: Current Outpatient Medications  Medication Sig Dispense Refill  . acetaminophen (TYLENOL) 500 MG tablet Take 1,000 mg by mouth every 6 (six) hours as needed for moderate pain.    Marland Kitchen aspirin 81 MG EC tablet Take 81 mg by mouth daily.      . Blood Glucose Monitoring Suppl (ONETOUCH VERIO FLEX SYSTEM) W/DEVICE KIT 1 kit by Does not apply route 3 (three) times daily. 1 kit 1  . buPROPion (WELLBUTRIN XL) 150 MG 24 hr tablet Take 1 tablet (150 mg total) by mouth every morning. 90 tablet 3  . carvedilol (COREG) 3.125 MG tablet Take 1 tablet (3.125 mg total) by mouth 2 (two) times daily. 60 tablet 3  . Cholecalciferol (VITAMIN D3) 1.25 MG (50000 UT) CAPS Take 1 capsule by mouth.    . Cyanocobalamin (VITAMIN B12 PO) Take 2,500 mcg by mouth daily.    Marland Kitchen doxycycline (VIBRA-TABS) 100 MG tablet Take 1 tablet (100 mg total) by mouth 2 (two) times daily. 20 tablet 0  . empagliflozin (JARDIANCE) 25 MG TABS tablet Take 25 mg by mouth daily. 30 tablet 11  . fluticasone (FLONASE) 50 MCG/ACT nasal spray Place 2 sprays into both nostrils daily. 16 g 6  . glucose blood (ONETOUCH VERIO) test strip Test  three times daily as directed. DX: E11.9 300 each 3  . Insulin Pen Needle (B-D ULTRAFINE III SHORT PEN) 31G X 8 MM MISC USE DAILY WITH LANTUS  INSULIN 90 each 1  . LANTUS SOLOSTAR 100 UNIT/ML Solostar Pen INJECT SUBCUTANEOUSLY 30  UNITS AT BEDTIME 30 mL 3  . metFORMIN (GLUCOPHAGE) 1000 MG tablet TAKE 1 TABLET BY MOUTH  TWICE DAILY WITH MEALS 180 tablet 3  .  omeprazole (PRILOSEC) 40 MG capsule TAKE 1 CAPSULE BY MOUTH  DAILY 90 capsule 3  . ONE TOUCH LANCETS MISC Test three times daily as directed. DX: E11.9 300 each 3  . Probiotic Product (PROBIOTIC ADVANCED PO) Take 1 tablet by mouth at bedtime.     . pyridOXINE (VITAMIN B-6) 100 MG tablet Take 100 mg by mouth daily.     . rosuvastatin (CRESTOR) 10 MG tablet Take 1 tablet (10 mg total) by mouth daily. 90 tablet 3  . sacubitril-valsartan (ENTRESTO) 24-26 MG Take 1 tablet by mouth 2 (two) times daily. 60 tablet 5  . sertraline (ZOLOFT) 100 MG tablet Take 1 tablet (100 mg total) by mouth daily. 90 tablet 3  . thiamine 100 MG tablet Take by mouth.    . TRULICITY 3 YP/9.5KD SOPN INJECT 3 MG INTO THE SKIN  WEEKLY 12 pen 2  . Vitamin E 180 MG CAPS Take 180 mg by mouth daily.     No current facility-administered medications for this encounter.    Physical Findings: The patient is in no acute distress. Patient is alert and oriented.  height is _0  (1.575 m) and weight is 147 lb (66.7 kg). Her temporal temperature is 97.8 F (36.6 C). Her blood pressure is 143/71 (abnormal) and her pulse is 89. Her respiration is 18 and oxygen saturation is 97%.  No significant changes. Lungs are clear to auscultation bilaterally. Heart has regular rate and rhythm. No palpable cervical, supraclavicular, or axillary adenopathy. Abdomen soft, non-tender, normal bowel sounds. Left breast: no palpable mass, nipple discharge or bleeding. Right breast: Patient has a well-healed scar in the lateral aspect of the breast, inferiorly.  No dominant mass appreciated in the breast nipple discharge or bleeding.  She also has a separate scar in the axillary region from her sentinel node procedure.  Lab Findings: Lab Results  Component Value Date   WBC 12.1 (H) 07/29/2019   HGB 10.8 (L) 07/29/2019   HCT 33.7 (L) 07/29/2019   MCV 93.1 07/29/2019   PLT 540 (H) 07/29/2019    Radiographic Findings: ECHOCARDIOGRAM COMPLETE  Result  Date: 07/20/2019   ECHOCARDIOGRAM REPORT   Patient Name:   CHERYSH EPPERLY Date of Exam: 07/20/2019 Medical Rec #:  [id number]        Height:       62.0 in Accession #:    [id number]       Weight:       141.1 lb Date of Birth:  07-06-1950        BSA:          1.65 m Patient Age:    31 years         BP:           132/82 mmHg Patient Gender: F                HR:           91 bpm. Exam Location:  Outpatient Procedure: 2D Echo, Cardiac Doppler, Color Doppler and 3D Echo Indications:    Z51.11  Encounter for antineoplastic chemotheraphy  History:        Patient has prior history of Echocardiogram examinations, most                 recent 04/15/2019. Abnormal ECG, Arrythmias:LBBB; Risk                 Factors:Dyslipidemia, Diabetes and Hypertension. Breast cancer.  Sonographer:    Roseanna Rainbow Referring Phys: White  1. Left ventricular ejection fraction, by visual estimation, is 30 to 35%. The left ventricle has severely decreased function. There is mildly increased left ventricular hypertrophy.  2. Abnormal septal motion consistent with left bundle branch block.  3. Elevated left atrial pressure.  4. Left ventricular diastolic parameters are consistent with Grade II diastolic dysfunction (pseudonormalization).  5. The left ventricle demonstrates global hypokinesis.  6. Global right ventricle has normal systolic function.The right ventricular size is normal. No increase in right ventricular wall thickness.  7. Left atrial size was normal.  8. Right atrial size was normal.  9. Mild mitral annular calcification. 10. The mitral valve is normal in structure. Mild mitral valve regurgitation. No evidence of mitral stenosis. 11. The tricuspid valve is normal in structure. 12. The tricuspid valve is normal in structure. Tricuspid valve regurgitation is trivial. 13. The aortic valve is normal in structure. Aortic valve regurgitation is not visualized. Mild to moderate aortic valve  sclerosis/calcification without any evidence of aortic stenosis. 14. The pulmonic valve was normal in structure. Pulmonic valve regurgitation is not visualized. 15. The inferior vena cava is normal in size with greater than 50% respiratory variability, suggesting right atrial pressure of 3 mmHg. 16. LVEF is severely decreased with global hypokinesis and paradoxical septal motion. FINDINGS  Left Ventricle: Left ventricular ejection fraction, by visual estimation, is 30 to 35%. The left ventricle has severely decreased function. The left ventricle demonstrates global hypokinesis. There is mildly increased left ventricular hypertrophy. Concentric left ventricular hypertrophy. Abnormal (paradoxical) septal motion, consistent with left bundle branch block. Left ventricular diastolic parameters are consistent with Grade II diastolic dysfunction (pseudonormalization). Elevated left atrial pressure. Right Ventricle: The right ventricular size is normal. No increase in right ventricular wall thickness. Global RV systolic function is has normal systolic function. Left Atrium: Left atrial size was normal in size. Right Atrium: Right atrial size was normal in size Pericardium: There is no evidence of pericardial effusion. Mitral Valve: The mitral valve is normal in structure. There is mild thickening of the mitral valve leaflet(s). Mild mitral annular calcification. Mild mitral valve regurgitation. No evidence of mitral valve stenosis by observation. Tricuspid Valve: The tricuspid valve is normal in structure. Tricuspid valve regurgitation is trivial. Aortic Valve: The aortic valve is normal in structure. Aortic valve regurgitation is not visualized. Mild to moderate aortic valve sclerosis/calcification is present, without any evidence of aortic stenosis. Pulmonic Valve: The pulmonic valve was normal in structure. Pulmonic valve regurgitation is not visualized. Pulmonic regurgitation is not visualized. Aorta: The aortic root,  ascending aorta and aortic arch are all structurally normal, with no evidence of dilitation or obstruction. Venous: The inferior vena cava is normal in size with greater than 50% respiratory variability, suggesting right atrial pressure of 3 mmHg. IAS/Shunts: No atrial level shunt detected by color flow Doppler. There is no evidence of a patent foramen ovale. No ventricular septal defect is seen or detected. There is no evidence of an atrial septal defect.  LEFT VENTRICLE PLAX 2D LVIDd:  4.40 cm       Diastology LVIDs:         3.85 cm       LV e' lateral:   13.50 cm/s LV PW:         1.00 cm       LV E/e' lateral: 9.2 LV IVS:        1.10 cm       LV e' medial:    4.03 cm/s LVOT diam:     2.00 cm       LV E/e' medial:  30.8 LV SV:         24 ml LV SV Index:   14.09 LVOT Area:     3.14 cm                               3D Volume EF: LV Volumes (MOD)             3D EF:        34 % LV area d, A2C:    29.20 cm LV EDV:       134 ml LV area d, A4C:    30.10 cm LV ESV:       89 ml LV area s, A2C:    22.10 cm LV SV:        45 ml LV area s, A4C:    25.00 cm LV major d, A2C:   7.84 cm LV major d, A4C:   8.07 cm LV major s, A2C:   7.07 cm LV major s, A4C:   7.22 cm LV vol d, MOD A2C: 91.4 ml LV vol d, MOD A4C: 90.6 ml LV vol s, MOD A2C: 58.0 ml LV vol s, MOD A4C: 70.7 ml LV SV MOD A2C:     33.4 ml LV SV MOD A4C:     90.6 ml LV SV MOD BP:      27.7 ml RIGHT VENTRICLE            IVC RV S prime:     9.30 cm/s  IVC diam: 2.20 cm TAPSE (M-mode): 1.6 cm LEFT ATRIUM             Index       RIGHT ATRIUM          Index LA diam:        2.80 cm 1.70 cm/m  RA Area:     8.30 cm LA Vol (A2C):   30.6 ml 18.56 ml/m RA Volume:   13.80 ml 8.37 ml/m LA Vol (A4C):   14.8 ml 8.98 ml/m LA Biplane Vol: 23.2 ml 14.08 ml/m  AORTIC VALVE LVOT Vmax:   199.00 cm/s LVOT Vmean:  136.000 cm/s LVOT VTI:    0.394 m  AORTA Ao Root diam: 3.10 cm Ao Asc diam:  2.40 cm MITRAL VALVE MV Area (PHT): 4.80 cm             SHUNTS MV PHT:        45.82  msec           Systemic VTI:  0.39 m MV Decel Time: 158 msec             Systemic Diam: 2.00 cm MV E velocity: 124.00 cm/s 103 cm/s  Ena Dawley MD Electronically signed by Ena Dawley MD Signature Date/Time: 07/20/2019/7:25:14 PM    Final     Impression:  Stage IA (pT1c, pN0, cM0)  Right Breast UOQ, Invasive Ductal Carcinoma with DCIS, ER+ / PR+ / Her2+, Grade 2.  Patient would be a good candidate for radiation therapy as breast conserving therapy.  The patient has completed her planned course of adjuvant chemotherapy is now ready to proceed with radiation therapy as part of her overall management.  I discussed the general course of treatment side effects and potential toxicities of radiation therapy in the situation with the patient.  She appears to understand and wishes to proceed with planned course of treatment.  Plan:  Patient is scheduled for CT simulation later today.  She would appear to be a good candidate for hypofractionated accelerated radiation therapy over approximately 4 weeks.  Given the close anterior margin at 1 mm the patient will receive a boost to the lumpectomy cavity.  -----------------------------------  Blair Promise, PhD, MD  This document serves as a record of services personally performed by Gery Pray, MD. It was created on his behalf by Clerance Lav, a trained medical scribe. The creation of this record is based on the scribe's personal observations and the provider's statements to them. This document has been checked and approved by the attending provider.

## 2019-08-13 ENCOUNTER — Telehealth: Payer: Self-pay | Admitting: Hematology and Oncology

## 2019-08-13 NOTE — Telephone Encounter (Signed)
Per 2/17 sch msg. Pt aware of appt date and time.

## 2019-08-17 ENCOUNTER — Other Ambulatory Visit: Payer: Self-pay

## 2019-08-17 ENCOUNTER — Ambulatory Visit (HOSPITAL_COMMUNITY)
Admission: RE | Admit: 2019-08-17 | Discharge: 2019-08-17 | Disposition: A | Discharge status: Home / Self Care | Payer: Medicare Other | Source: Ambulatory Visit | Attending: Cardiology | Admitting: Cardiology

## 2019-08-17 VITALS — BP 152/94 | HR 84 | Wt 145.0 lb

## 2019-08-17 DIAGNOSIS — Z9221 Personal history of antineoplastic chemotherapy: Secondary | ICD-10-CM | POA: Diagnosis not present

## 2019-08-17 DIAGNOSIS — C50911 Malignant neoplasm of unspecified site of right female breast: Secondary | ICD-10-CM | POA: Insufficient documentation

## 2019-08-17 DIAGNOSIS — I447 Left bundle-branch block, unspecified: Secondary | ICD-10-CM | POA: Insufficient documentation

## 2019-08-17 DIAGNOSIS — Z79899 Other long term (current) drug therapy: Secondary | ICD-10-CM | POA: Insufficient documentation

## 2019-08-17 DIAGNOSIS — Z170 Estrogen receptor positive status [ER+]: Secondary | ICD-10-CM | POA: Insufficient documentation

## 2019-08-17 DIAGNOSIS — I5022 Chronic systolic (congestive) heart failure: Secondary | ICD-10-CM | POA: Insufficient documentation

## 2019-08-17 DIAGNOSIS — I429 Cardiomyopathy, unspecified: Secondary | ICD-10-CM | POA: Insufficient documentation

## 2019-08-17 MED ORDER — SPIRONOLACTONE 25 MG PO TABS: 25.0000 mg | ORAL_TABLET | Freq: Every day | ORAL | 3 refills | Status: DC

## 2019-08-17 NOTE — Progress Notes (Signed)
PCP: [name], FNP  Oncology: Dr. Lindi Adie  HF cardiology: Dr. Aundra Dubin   HPI:  69 y.o. with history of DM, chronic LBBB, and breast cancer was referred by Dr. Lindi Adie for workup of fall in EF. She was diagnosed with right breast cancer, ER+/PR+/HER2+ in 9/20. She had right lumpectomy in 10/20. Starting in 11/20, she has been getting 12 cycles of Taxol/Herceptin to be followed by Herceptin alone to complete a year. Echo in 10/20 showed EF 55-60% per report, likely due to septal-lateral dyssynchrony. Her repeat echo for Herceptin screening was done, showing EF down to 30-35% with septal-lateral dyssynchrony. Herceptin has been stopped for now.   She was last seen by Dr. Aundra Dubin on 07/24/19. Her olmesartan was stopped and pt was started on Entresto 24/26 mg BID. Carvedilol 3.125 mg BID was also added with careful monitoring with known chronic LBBB / transient CHB. BMET on 2/15 was stable after initiation of Entresto.   Patient presents to HF PharmD clinic for follow up. Overall feeling well - but she complains of skin burn/rash since starting Entresto on 07/27/19. After using hydrocortisone cream and aloe, this skin rash has improved, redness has mostly resolved. She says she has some dizziness/lightheadedness chronically, only worsens occasionally upon standing. Denies fatigue since stopping chemo. She denies chest pain or palpitations. She reports her breathing is good and denies any shortness of breath. She is able to complete all ADLs. Activity level is normal - she can do house work and errands with no issues. Her reported weight at home ranges from 138-140 lbs. Denies orthopnea/PND. Her appetite has also improved after stopping chemo. She states she is going to be starting radiation this Wednesday.   Shortness of breath/dyspnea on exertion? no  Orthopnea/PND? no  Edema? no  Lightheadedness/dizziness? Yes-chronic  Daily weights at home? yes  Blood pressure/heart rate monitoring at home? no   Following low-sodium/fluid-restricted diet? Yes  HF Medications:  Entresto 24/26 mg BID  Carvedilol 3.125 mg BID  Empagliflozin 25 mg daily   Has the patient been experiencing any side effects to the medications prescribed? Yes - possible rash from [facility] - has improved after starting hydrocortisone cream and aloe. Rash was only on hands and forearms.    Does the patient have any problems obtaining medications due to transportation or finances? No, has Medicare part D   Understanding of regimen: good  Understanding of indications: good  Potential of compliance: good  Patient understands to avoid NSAIDs.  Patient understands to avoid decongestants.   Pertinent Lab Values (08/10/19):  Serum creatinine 0.67, BUN 8, Potassium 3.9, Sodium 144  Vital Signs:  Weight: 145 lbs (last clinic weight: 147 lbs)  Blood pressure: 152/94  Heart rate: 84  Assessment:  1. 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 NYHA class II symptoms.  - Continue Entresto 24/26 mg BID - monitor rash, redness has almost resolved. She is starting radiation on Wednesday which could cause further skin irritation.  - Continue carvedilol 3.125 mg BID  - Start spironolactone 25 mg daily. Repeat BMET in 1 week.   - Continue empagliflozin 25 mg daily  - Basic disease state pathophysiology, medication indication, mechanism and side effects reviewed at length with patient and he verbalized understanding  2. 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  - Continue carvedilol 3.125 mg BID  3. LBBB: Chronic.  - Continue carvedilol 3.125 mg BID  4. Breast cancer: Herceptin  on hold for now.  - If EF recovers, would be reasonable to retry Herceptin with close monitoring and ongoing use of cardiomyopathy meds.  - Starting radiation on Wednesday. Monitor skin rash.   Plan:  1) Medication changes: Based on  clinical presentation, vital signs and recent labs will add spironolactone 25 mg daily  2) Labs: none today - BMET in 1 week  3) Follow-up: Lab in 1 week; 09/15/19 with Dr. Johny Shock, PharmD, BCPS  Clinical Pharmacist  Phone: [phone]  08/16/2019 10:25 AM  Audry Riles, PharmD, BCPS, BCCP, CPP Heart Failure Clinic Pharmacist [phone]

## 2019-08-17 NOTE — Patient Instructions (Addendum)
It was a pleasure seeing you today!  MEDICATIONS: -We are changing your medications today -Start spironolactone 1 tablet (25 mg) once daily -Continue taking Entresto 1 tablet (24/26 mg) twice daily -Continue taking carvedilol 1 tablet (3.125 mg) twice daily -Continue taking empagliflozin 1 tablet (25 mg) once daily -Call if you have questions about your medications.  NEXT APPOINTMENT: Return to clinic in 1 week with lab.  In general, to take care of your heart failure: -Limit your fluid intake to 2 Liters (half-gallon) per day.   -Limit your salt intake to ideally 2-3 grams (2000-3000 mg) per day. -Weigh yourself daily and record, and bring that "weight diary" to your next appointment.  (Weight gain of 2-3 pounds in 1 day typically means fluid weight.) -The medications for your heart are to help your heart and help you live longer.   -Please contact us before stopping any of your heart medications.  Call the clinic at [phone] with questions or to reschedule future appointments.

## 2019-08-18 ENCOUNTER — Encounter: Payer: Self-pay | Admitting: *Deleted

## 2019-08-18 ENCOUNTER — Other Ambulatory Visit: Payer: Self-pay | Admitting: Family

## 2019-08-18 DIAGNOSIS — E119 Type 2 diabetes mellitus without complications: Secondary | ICD-10-CM | POA: Diagnosis not present

## 2019-08-18 DIAGNOSIS — C50411 Malignant neoplasm of upper-outer quadrant of right female breast: Secondary | ICD-10-CM | POA: Diagnosis not present

## 2019-08-18 DIAGNOSIS — Z7984 Long term (current) use of oral hypoglycemic drugs: Secondary | ICD-10-CM | POA: Diagnosis not present

## 2019-08-18 DIAGNOSIS — Z510 Encounter for antineoplastic radiation therapy: Secondary | ICD-10-CM | POA: Diagnosis not present

## 2019-08-19 ENCOUNTER — Other Ambulatory Visit: Payer: Self-pay

## 2019-08-19 ENCOUNTER — Ambulatory Visit: Payer: Medicare Other

## 2019-08-19 DIAGNOSIS — Z510 Encounter for antineoplastic radiation therapy: Secondary | ICD-10-CM | POA: Diagnosis not present

## 2019-08-19 DIAGNOSIS — C50411 Malignant neoplasm of upper-outer quadrant of right female breast: Secondary | ICD-10-CM | POA: Diagnosis not present

## 2019-08-20 ENCOUNTER — Ambulatory Visit
Admission: RE | Admit: 2019-08-20 | Discharge: 2019-08-20 | Disposition: A | Discharge status: Home / Self Care | Payer: Medicare Other | Source: Ambulatory Visit | Attending: Radiation Oncology | Admitting: Radiation Oncology

## 2019-08-20 ENCOUNTER — Ambulatory Visit: Payer: Medicare Other

## 2019-08-20 ENCOUNTER — Other Ambulatory Visit: Payer: Self-pay

## 2019-08-20 DIAGNOSIS — C50411 Malignant neoplasm of upper-outer quadrant of right female breast: Secondary | ICD-10-CM | POA: Diagnosis not present

## 2019-08-20 DIAGNOSIS — Z510 Encounter for antineoplastic radiation therapy: Secondary | ICD-10-CM | POA: Diagnosis not present

## 2019-08-21 ENCOUNTER — Other Ambulatory Visit: Payer: Self-pay

## 2019-08-21 ENCOUNTER — Ambulatory Visit
Admission: RE | Admit: 2019-08-21 | Discharge: 2019-08-21 | Disposition: A | Discharge status: Home / Self Care | Payer: Medicare Other | Source: Ambulatory Visit | Attending: Radiation Oncology | Admitting: Radiation Oncology

## 2019-08-21 ENCOUNTER — Ambulatory Visit: Payer: Medicare Other

## 2019-08-21 DIAGNOSIS — Z510 Encounter for antineoplastic radiation therapy: Secondary | ICD-10-CM | POA: Diagnosis not present

## 2019-08-21 DIAGNOSIS — C50411 Malignant neoplasm of upper-outer quadrant of right female breast: Secondary | ICD-10-CM | POA: Diagnosis not present

## 2019-08-23 ENCOUNTER — Ambulatory Visit: Payer: Medicare Other

## 2019-08-24 ENCOUNTER — Ambulatory Visit (HOSPITAL_COMMUNITY)
Admission: RE | Admit: 2019-08-24 | Discharge: 2019-08-24 | Disposition: A | Discharge status: Home / Self Care | Payer: Medicare Other | Source: Ambulatory Visit | Attending: Cardiology | Admitting: Cardiology

## 2019-08-24 ENCOUNTER — Ambulatory Visit: Payer: Medicare Other

## 2019-08-24 ENCOUNTER — Other Ambulatory Visit: Payer: Self-pay

## 2019-08-24 ENCOUNTER — Ambulatory Visit
Admission: RE | Admit: 2019-08-24 | Discharge: 2019-08-24 | Disposition: A | Discharge status: Home / Self Care | Payer: Medicare Other | Source: Ambulatory Visit | Attending: Radiation Oncology | Admitting: Radiation Oncology

## 2019-08-24 ENCOUNTER — Other Ambulatory Visit (HOSPITAL_COMMUNITY): Payer: Self-pay

## 2019-08-24 DIAGNOSIS — Z170 Estrogen receptor positive status [ER+]: Secondary | ICD-10-CM | POA: Insufficient documentation

## 2019-08-24 DIAGNOSIS — Z510 Encounter for antineoplastic radiation therapy: Secondary | ICD-10-CM | POA: Diagnosis not present

## 2019-08-24 DIAGNOSIS — C50411 Malignant neoplasm of upper-outer quadrant of right female breast: Secondary | ICD-10-CM

## 2019-08-24 LAB — BASIC METABOLIC PANEL
Anion gap: 11 (ref 5–15)
BUN: 12 mg/dL (ref 8–23)
CO2: 26 mmol/L (ref 22–32)
Calcium: 9.8 mg/dL (ref 8.9–10.3)
Chloride: 105 mmol/L (ref 98–111)
Creatinine, Ser: 0.71 mg/dL (ref 0.44–1.00)
GFR calc Af Amer: >60 mL/min (ref >60)
GFR calc non Af Amer: >60 mL/min (ref >60)
Glucose, Bld: 157 mg/dL — ABNORMAL HIGH (ref 70–99)
Potassium: 4.5 mmol/L (ref 3.5–5.1)
Sodium: 142 mmol/L (ref 135–145)

## 2019-08-25 ENCOUNTER — Ambulatory Visit: Payer: Medicare Other

## 2019-08-25 ENCOUNTER — Ambulatory Visit
Admission: RE | Admit: 2019-08-25 | Discharge: 2019-08-25 | Disposition: A | Discharge status: Home / Self Care | Payer: Medicare Other | Source: Ambulatory Visit | Attending: Radiation Oncology | Admitting: Radiation Oncology

## 2019-08-25 ENCOUNTER — Other Ambulatory Visit: Payer: Self-pay

## 2019-08-25 DIAGNOSIS — Z510 Encounter for antineoplastic radiation therapy: Secondary | ICD-10-CM | POA: Diagnosis not present

## 2019-08-25 DIAGNOSIS — C50411 Malignant neoplasm of upper-outer quadrant of right female breast: Secondary | ICD-10-CM | POA: Diagnosis not present

## 2019-08-26 ENCOUNTER — Other Ambulatory Visit: Payer: Self-pay

## 2019-08-26 ENCOUNTER — Ambulatory Visit: Payer: Medicare Other

## 2019-08-26 ENCOUNTER — Ambulatory Visit
Admission: RE | Admit: 2019-08-26 | Discharge: 2019-08-26 | Disposition: A | Discharge status: Home / Self Care | Payer: Medicare Other | Source: Ambulatory Visit | Attending: Radiation Oncology | Admitting: Radiation Oncology

## 2019-08-26 DIAGNOSIS — Z510 Encounter for antineoplastic radiation therapy: Secondary | ICD-10-CM | POA: Diagnosis not present

## 2019-08-26 DIAGNOSIS — C50411 Malignant neoplasm of upper-outer quadrant of right female breast: Secondary | ICD-10-CM | POA: Diagnosis not present

## 2019-08-27 ENCOUNTER — Other Ambulatory Visit: Payer: Self-pay

## 2019-08-27 ENCOUNTER — Ambulatory Visit: Payer: Medicare Other

## 2019-08-27 ENCOUNTER — Ambulatory Visit
Admission: RE | Admit: 2019-08-27 | Discharge: 2019-08-27 | Disposition: A | Discharge status: Home / Self Care | Payer: Medicare Other | Source: Ambulatory Visit | Attending: Radiation Oncology | Admitting: Radiation Oncology

## 2019-08-27 ENCOUNTER — Encounter: Payer: Self-pay | Admitting: *Deleted

## 2019-08-27 DIAGNOSIS — Z510 Encounter for antineoplastic radiation therapy: Secondary | ICD-10-CM | POA: Diagnosis not present

## 2019-08-27 DIAGNOSIS — C50411 Malignant neoplasm of upper-outer quadrant of right female breast: Secondary | ICD-10-CM | POA: Diagnosis not present

## 2019-08-28 ENCOUNTER — Ambulatory Visit: Payer: Medicare Other

## 2019-08-28 ENCOUNTER — Ambulatory Visit
Admission: RE | Admit: 2019-08-28 | Discharge: 2019-08-28 | Disposition: A | Discharge status: Home / Self Care | Payer: Medicare Other | Source: Ambulatory Visit | Attending: Radiation Oncology | Admitting: Radiation Oncology

## 2019-08-28 ENCOUNTER — Other Ambulatory Visit: Payer: Self-pay

## 2019-08-28 DIAGNOSIS — C50411 Malignant neoplasm of upper-outer quadrant of right female breast: Secondary | ICD-10-CM | POA: Diagnosis not present

## 2019-08-28 DIAGNOSIS — Z510 Encounter for antineoplastic radiation therapy: Secondary | ICD-10-CM | POA: Diagnosis not present

## 2019-08-30 ENCOUNTER — Ambulatory Visit: Payer: Medicare Other | Attending: Internal Medicine

## 2019-08-30 DIAGNOSIS — Z23 Encounter for immunization: Secondary | ICD-10-CM | POA: Insufficient documentation

## 2019-08-30 NOTE — Progress Notes (Signed)
   Covid-19 Vaccination Clinic  Name:  KHARIS SCHUNEMAN    MRN: [id number] DOB: 10/23/1950  08/30/2019  Ms. Hinz was observed post Covid-19 immunization for 15 minutes without incident. She was provided with Vaccine Information Sheet and instruction to access the V-Safe system.   Ms. Corter was instructed to call 911 with any severe reactions post vaccine: Marland Kitchen Difficulty breathing  . Swelling of face and throat  . A fast heartbeat  . A bad rash all over body  . Dizziness and weakness   Immunizations Administered    Name Date Dose VIS Date Route   Pfizer COVID-19 Vaccine 08/30/2019 11:06 AM 0.3 mL 06/05/2019 Intramuscular   Manufacturer: [place]   Lot: [id number]   N[place]es: [id number]

## 2019-08-31 ENCOUNTER — Ambulatory Visit
Admission: RE | Admit: 2019-08-31 | Discharge: 2019-08-31 | Disposition: A | Discharge status: Home / Self Care | Payer: Medicare Other | Source: Ambulatory Visit | Attending: Radiation Oncology | Admitting: Radiation Oncology

## 2019-08-31 ENCOUNTER — Other Ambulatory Visit: Payer: Self-pay

## 2019-08-31 ENCOUNTER — Ambulatory Visit: Payer: Medicare Other

## 2019-08-31 DIAGNOSIS — Z510 Encounter for antineoplastic radiation therapy: Secondary | ICD-10-CM | POA: Diagnosis not present

## 2019-08-31 DIAGNOSIS — C50411 Malignant neoplasm of upper-outer quadrant of right female breast: Secondary | ICD-10-CM | POA: Diagnosis not present

## 2019-09-01 ENCOUNTER — Ambulatory Visit: Payer: Medicare Other

## 2019-09-01 ENCOUNTER — Ambulatory Visit
Admission: RE | Admit: 2019-09-01 | Discharge: 2019-09-01 | Disposition: A | Discharge status: Home / Self Care | Payer: Medicare Other | Source: Ambulatory Visit | Attending: Radiation Oncology | Admitting: Radiation Oncology

## 2019-09-01 ENCOUNTER — Other Ambulatory Visit: Payer: Self-pay

## 2019-09-01 DIAGNOSIS — Z510 Encounter for antineoplastic radiation therapy: Secondary | ICD-10-CM | POA: Diagnosis not present

## 2019-09-01 DIAGNOSIS — C50411 Malignant neoplasm of upper-outer quadrant of right female breast: Secondary | ICD-10-CM | POA: Diagnosis not present

## 2019-09-02 ENCOUNTER — Ambulatory Visit: Payer: Medicare Other

## 2019-09-02 ENCOUNTER — Ambulatory Visit
Admission: RE | Admit: 2019-09-02 | Discharge: 2019-09-02 | Disposition: A | Discharge status: Home / Self Care | Payer: Medicare Other | Source: Ambulatory Visit | Attending: Radiation Oncology | Admitting: Radiation Oncology

## 2019-09-02 ENCOUNTER — Other Ambulatory Visit: Payer: Self-pay

## 2019-09-02 DIAGNOSIS — Z510 Encounter for antineoplastic radiation therapy: Secondary | ICD-10-CM | POA: Diagnosis not present

## 2019-09-02 DIAGNOSIS — C50411 Malignant neoplasm of upper-outer quadrant of right female breast: Secondary | ICD-10-CM | POA: Diagnosis not present

## 2019-09-03 ENCOUNTER — Other Ambulatory Visit: Payer: Self-pay

## 2019-09-03 ENCOUNTER — Ambulatory Visit
Admission: RE | Admit: 2019-09-03 | Discharge: 2019-09-03 | Disposition: A | Discharge status: Home / Self Care | Payer: Medicare Other | Source: Ambulatory Visit | Attending: Radiation Oncology | Admitting: Radiation Oncology

## 2019-09-03 ENCOUNTER — Ambulatory Visit: Payer: Medicare Other

## 2019-09-03 DIAGNOSIS — Z510 Encounter for antineoplastic radiation therapy: Secondary | ICD-10-CM | POA: Diagnosis not present

## 2019-09-03 DIAGNOSIS — C50411 Malignant neoplasm of upper-outer quadrant of right female breast: Secondary | ICD-10-CM | POA: Diagnosis not present

## 2019-09-04 ENCOUNTER — Ambulatory Visit: Payer: Medicare Other

## 2019-09-04 ENCOUNTER — Ambulatory Visit
Admission: RE | Admit: 2019-09-04 | Discharge: 2019-09-04 | Disposition: A | Discharge status: Home / Self Care | Payer: Medicare Other | Source: Ambulatory Visit | Attending: Radiation Oncology | Admitting: Radiation Oncology

## 2019-09-04 ENCOUNTER — Other Ambulatory Visit: Payer: Self-pay

## 2019-09-04 DIAGNOSIS — C50411 Malignant neoplasm of upper-outer quadrant of right female breast: Secondary | ICD-10-CM | POA: Diagnosis not present

## 2019-09-04 DIAGNOSIS — Z510 Encounter for antineoplastic radiation therapy: Secondary | ICD-10-CM | POA: Diagnosis not present

## 2019-09-07 ENCOUNTER — Ambulatory Visit
Admission: RE | Admit: 2019-09-07 | Discharge: 2019-09-07 | Disposition: A | Discharge status: Home / Self Care | Payer: Medicare Other | Source: Ambulatory Visit | Attending: Radiation Oncology | Admitting: Radiation Oncology

## 2019-09-07 ENCOUNTER — Ambulatory Visit: Payer: Medicare Other

## 2019-09-07 ENCOUNTER — Other Ambulatory Visit: Payer: Self-pay

## 2019-09-07 DIAGNOSIS — C50411 Malignant neoplasm of upper-outer quadrant of right female breast: Secondary | ICD-10-CM | POA: Diagnosis not present

## 2019-09-07 DIAGNOSIS — Z510 Encounter for antineoplastic radiation therapy: Secondary | ICD-10-CM | POA: Diagnosis not present

## 2019-09-08 ENCOUNTER — Ambulatory Visit: Payer: Medicare Other

## 2019-09-08 ENCOUNTER — Ambulatory Visit
Admission: RE | Admit: 2019-09-08 | Discharge: 2019-09-08 | Disposition: A | Discharge status: Home / Self Care | Payer: Medicare Other | Source: Ambulatory Visit | Attending: Radiation Oncology | Admitting: Radiation Oncology

## 2019-09-08 ENCOUNTER — Other Ambulatory Visit: Payer: Self-pay

## 2019-09-08 DIAGNOSIS — Z510 Encounter for antineoplastic radiation therapy: Secondary | ICD-10-CM | POA: Diagnosis not present

## 2019-09-08 DIAGNOSIS — C50411 Malignant neoplasm of upper-outer quadrant of right female breast: Secondary | ICD-10-CM | POA: Diagnosis not present

## 2019-09-09 ENCOUNTER — Other Ambulatory Visit: Payer: Self-pay

## 2019-09-09 ENCOUNTER — Ambulatory Visit
Admission: RE | Admit: 2019-09-09 | Discharge: 2019-09-09 | Disposition: A | Discharge status: Home / Self Care | Payer: Medicare Other | Source: Ambulatory Visit | Attending: Radiation Oncology | Admitting: Radiation Oncology

## 2019-09-09 DIAGNOSIS — C50411 Malignant neoplasm of upper-outer quadrant of right female breast: Secondary | ICD-10-CM | POA: Diagnosis not present

## 2019-09-09 DIAGNOSIS — Z510 Encounter for antineoplastic radiation therapy: Secondary | ICD-10-CM | POA: Diagnosis not present

## 2019-09-10 ENCOUNTER — Other Ambulatory Visit: Payer: Self-pay

## 2019-09-10 ENCOUNTER — Ambulatory Visit
Admission: RE | Admit: 2019-09-10 | Discharge: 2019-09-10 | Disposition: A | Discharge status: Home / Self Care | Payer: Medicare Other | Source: Ambulatory Visit | Attending: Radiation Oncology | Admitting: Radiation Oncology

## 2019-09-10 DIAGNOSIS — Z510 Encounter for antineoplastic radiation therapy: Secondary | ICD-10-CM | POA: Diagnosis not present

## 2019-09-10 DIAGNOSIS — C50411 Malignant neoplasm of upper-outer quadrant of right female breast: Secondary | ICD-10-CM | POA: Diagnosis not present

## 2019-09-11 ENCOUNTER — Other Ambulatory Visit: Payer: Self-pay

## 2019-09-11 ENCOUNTER — Ambulatory Visit
Admission: RE | Admit: 2019-09-11 | Discharge: 2019-09-11 | Disposition: A | Discharge status: Home / Self Care | Payer: Medicare Other | Source: Ambulatory Visit | Attending: Radiation Oncology | Admitting: Radiation Oncology

## 2019-09-11 DIAGNOSIS — C50411 Malignant neoplasm of upper-outer quadrant of right female breast: Secondary | ICD-10-CM | POA: Diagnosis not present

## 2019-09-11 DIAGNOSIS — Z510 Encounter for antineoplastic radiation therapy: Secondary | ICD-10-CM | POA: Diagnosis not present

## 2019-09-13 NOTE — Progress Notes (Signed)
Patient Care Team: [name], [place] as PCP - General (Internal Medicine) [name], [name], MD as PCP - Cardiology (Cardiology) [name], RN as Oncology Nurse Navigator [name], RN as Oncology Nurse Navigator  DIAGNOSIS:    ICD-10-CM   1. Malignant neoplasm of upper-outer quadrant of right breast in female, estrogen receptor positive ([place])  C50.411    Z17.0     SUMMARY OF ONCOLOGIC HISTORY: Oncology History  Malignant neoplasm of upper-outer quadrant of right breast in female, estrogen receptor positive ([place])  03/16/2019 Cancer Staging   Staging form: Breast, AJCC 8th Edition - Clinical stage from 03/16/2019: Stage IA (cT1c, cN0, cM0, G2, ER+, PR+, HER2+) - Signed by [name], NP on 03/25/2019   03/25/2019 Initial Diagnosis   Routine screening mammogram detected a 1.3cm mass in the right breast, 9 o'clock position, no axillary adenopathy. 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    04/15/2019 Surgery   Right lumpectomy [name]): IDC, grade 2, 1.3cm, with intermediate grade DCIS, clear margins, and 1 right axillary lymph node negative   04/15/2019 Cancer Staging   Staging form: Breast, AJCC 8th Edition - Pathologic stage from 04/15/2019: Stage IA (pT1c, pN0, cM0, G2, ER+, PR+, HER2+) - Signed by [name], NP on 04/29/2019   05/13/2019 -  Chemotherapy   Weekly Taxol and Herceptin x 12, then maintenance Herceptin every 3 weeks x 1 year   08/20/2019 -  Radiation Therapy   Adjuvant radiation     CHIEF COMPLIANT: Follow-up to discuss antiestrogen therapy  INTERVAL HISTORY: Margaret Norman is a 69 y.o. with above-mentioned history of breast cancerwho underwent alumpectomy and is currently on radiation therapy. She presents to the clinic today to discuss antiestrogen therapy.   ALLERGIES:  is allergic to invokana [canagliflozin] and amoxicillin.  MEDICATIONS:  Current  Outpatient Medications  Medication Sig Dispense Refill  . acetaminophen (TYLENOL) 500 MG tablet Take 1,000 mg by mouth every 6 (six) hours as needed for moderate pain.    Marland Kitchen aspirin 81 MG EC tablet Take 81 mg by mouth daily.      . Blood Glucose Monitoring Suppl (ONETOUCH VERIO FLEX SYSTEM) W/DEVICE KIT 1 kit by Does not apply route 3 (three) times daily. 1 kit 1  . buPROPion (WELLBUTRIN XL) 150 MG 24 hr tablet Take 1 tablet (150 mg total) by mouth every morning. 90 tablet 3  . carvedilol (COREG) 3.125 MG tablet Take 1 tablet (3.125 mg total) by mouth 2 (two) times daily. 60 tablet 3  . Cholecalciferol (VITAMIN D3) 1.25 MG (50000 UT) CAPS Take 1 capsule by mouth.    . Cyanocobalamin (VITAMIN B12 PO) Take 2,500 mcg by mouth daily.    Marland Kitchen doxycycline (VIBRA-TABS) 100 MG tablet Take 1 tablet (100 mg total) by mouth 2 (two) times daily. 20 tablet 0  . empagliflozin (JARDIANCE) 25 MG TABS tablet Take 25 mg by mouth daily. 30 tablet 11  . fluticasone (FLONASE) 50 MCG/ACT nasal spray Place 2 sprays into both nostrils daily. 16 g 6  . glucose blood (ONETOUCH VERIO) test strip Test three times daily as directed. DX: E11.9 300 each 3  . Insulin Pen Needle (B-D ULTRAFINE III SHORT PEN) 31G X 8 MM MISC USE DAILY WITH LANTUS  INSULIN 90 each 1  . LANTUS SOLOSTAR 100 UNIT/ML Solostar Pen INJECT SUBCUTANEOUSLY 30  UNITS AT BEDTIME 30 mL 3  . metFORMIN (GLUCOPHAGE) 1000 MG tablet  TAKE 1 TABLET BY MOUTH  TWICE DAILY WITH MEALS 180 tablet 3  . omeprazole (PRILOSEC) 40 MG capsule TAKE 1 CAPSULE BY MOUTH  DAILY 90 capsule 3  . ONE TOUCH LANCETS MISC Test three times daily as directed. DX: E11.9 300 each 3  . Probiotic Product (PROBIOTIC ADVANCED PO) Take 1 tablet by mouth at bedtime.     . pyridOXINE (VITAMIN B-6) 100 MG tablet Take 100 mg by mouth daily.     . rosuvastatin (CRESTOR) 10 MG tablet Take 1 tablet (10 mg total) by mouth daily. 90 tablet 3  . sacubitril-valsartan (ENTRESTO) 24-26 MG Take 1 tablet by mouth  2 (two) times daily. 60 tablet 5  . sertraline (ZOLOFT) 100 MG tablet Take 1 tablet (100 mg total) by mouth daily. 90 tablet 3  . spironolactone (ALDACTONE) 25 MG tablet Take 1 tablet (25 mg total) by mouth daily. 90 tablet 3  . thiamine 100 MG tablet Take by mouth.    . TRULICITY 3 CZ/6.6AY SOPN INJECT 3 MG INTO THE SKIN  WEEKLY 12 pen 2  . Vitamin E 180 MG CAPS Take 180 mg by mouth daily.     No current facility-administered medications for this visit.    PHYSICAL EXAMINATION: ECOG PERFORMANCE STATUS: 1 - Symptomatic but completely ambulatory  Vitals:   09/14/19 1414  BP: 125/62  Pulse: 97  Resp: 16  Temp: 97.8 F (36.6 C)  SpO2: 98%   Filed Weights   09/14/19 1414  Weight: 137 lb 12.8 oz (62.5 kg)    LABORATORY DATA:  I have reviewed the data as listed CMP Latest Ref Rng & Units 08/24/2019 08/10/2019 07/29/2019  Glucose 70 - 99 mg/dL 157(H) 149(H) 145(H)  BUN 8 - 23 mg/dL 12 8 12   Creatinine 0.44 - 1.00 mg/dL 0.71 0.67 0.78  Sodium 135 - 145 mmol/L 142 144 141  Potassium 3.5 - 5.1 mmol/L 4.5 3.9 3.6  Chloride 98 - 111 mmol/L 105 108 108  CO2 22 - 32 mmol/L 26 24 22   Calcium 8.9 - 10.3 mg/dL 9.8 9.2 8.8(L)  Total Protein 6.5 - 8.1 g/dL - - 6.2(L)  Total Bilirubin 0.3 - 1.2 mg/dL - - 0.3  Alkaline Phos 38 - 126 U/L - - 56  AST 15 - 41 U/L - - 17  ALT 0 - 44 U/L - - 19    Lab Results  Component Value Date   WBC 12.1 (H) 07/29/2019   HGB 10.8 (L) 07/29/2019   HCT 33.7 (L) 07/29/2019   MCV 93.1 07/29/2019   PLT 540 (H) 07/29/2019   NEUTROABS 6.0 07/29/2019    ASSESSMENT & PLAN:  Malignant neoplasm of upper-outer quadrant of right breast in female, estrogen receptor positive ([facility]) Routine screening mammogram detected a 1.3cm mass in the right breast, 9 o'clock position, no axillary adenopathy. 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 T1c N0 stage Ia  Recommendations: 1. 04/15/19: Breast conserving surgery: Right  lumpectomy [name]): IDC, grade 2, 1.3cm, with intermediate grade DCIS, clear margins, and 1 right axillary lymph node negative 2.Adj chemo with Taxol-Herceptin foll by Herceptin maintenance for 1 yearstarted 05/13/2019 3. Adjuvant radiation therapy 08/20/2019-09/14/2019 4. Adjuvant antiestrogen therapy with anastrozole 1 mg daily  Enrolled inSWOG 1714 trials.  ------------------------------------------------------------------------------------------------------------------------- Current treatment: Herceptin maintenance (awaiting repeat echo results) EchoEF 55 to 60%04/15/2019 EF 30 to 35% on 07/20/2019  As we are waiting for the cardiac ejection fraction to improve, we will start  her on antiestrogen therapy with anastrozole 1 mg daily which will be started April 2021.  Anastrozole counseling: We discussed the risks and benefits of anti-estrogen therapy with aromatase inhibitors. These include but not limited to insomnia, hot flashes, mood changes, vaginal dryness, bone density loss, and weight gain. We strongly believe that the benefits far outweigh the risks. Patient understands these risks and consented to starting treatment. Planned treatment duration is 7 years.  Patient agreed to participate in antiestrogen therapy compliance study.  He signed the consent form today. Return to clinic as soon as we get the green signal to start back on Herceptin.   No orders of the defined types were placed in this encounter.  The patient has a good understanding of the overall plan. she agrees with it. she will call with any problems that may develop before the next visit here.  Total time spent: 30 mins including face to face time and time spent for planning, charting and coordination of care  Nicholas Lose, MD 09/14/2019  I, Cloyde Reams Dorshimer, am acting as scribe for Dr. Nicholas Lose.  I have reviewed the above documentation for accuracy and completeness, and I agree with the above.

## 2019-09-14 ENCOUNTER — Inpatient Hospital Stay: Payer: Medicare Other | Attending: Hematology and Oncology | Admitting: Hematology and Oncology

## 2019-09-14 ENCOUNTER — Encounter: Payer: Self-pay | Admitting: *Deleted

## 2019-09-14 ENCOUNTER — Other Ambulatory Visit: Payer: Self-pay | Admitting: *Deleted

## 2019-09-14 ENCOUNTER — Encounter: Payer: Self-pay | Admitting: Radiation Oncology

## 2019-09-14 ENCOUNTER — Ambulatory Visit
Admission: RE | Admit: 2019-09-14 | Discharge: 2019-09-14 | Disposition: A | Discharge status: Home / Self Care | Payer: Medicare Other | Source: Ambulatory Visit | Attending: Radiation Oncology | Admitting: Radiation Oncology

## 2019-09-14 ENCOUNTER — Other Ambulatory Visit: Payer: Self-pay

## 2019-09-14 DIAGNOSIS — C50411 Malignant neoplasm of upper-outer quadrant of right female breast: Secondary | ICD-10-CM | POA: Insufficient documentation

## 2019-09-14 DIAGNOSIS — Z923 Personal history of irradiation: Secondary | ICD-10-CM | POA: Diagnosis not present

## 2019-09-14 DIAGNOSIS — Z510 Encounter for antineoplastic radiation therapy: Secondary | ICD-10-CM | POA: Diagnosis not present

## 2019-09-14 DIAGNOSIS — Z170 Estrogen receptor positive status [ER+]: Secondary | ICD-10-CM | POA: Insufficient documentation

## 2019-09-14 DIAGNOSIS — Z794 Long term (current) use of insulin: Secondary | ICD-10-CM | POA: Insufficient documentation

## 2019-09-14 DIAGNOSIS — Z7982 Long term (current) use of aspirin: Secondary | ICD-10-CM | POA: Insufficient documentation

## 2019-09-14 DIAGNOSIS — Z79899 Other long term (current) drug therapy: Secondary | ICD-10-CM | POA: Diagnosis not present

## 2019-09-14 DIAGNOSIS — Z9221 Personal history of antineoplastic chemotherapy: Secondary | ICD-10-CM | POA: Diagnosis not present

## 2019-09-14 MED ORDER — ANASTROZOLE 1 MG PO TABS: 1.0000 mg | ORAL_TABLET | Freq: Every day | ORAL | 3 refills | Status: DC

## 2019-09-14 NOTE — Assessment & Plan Note (Signed)
Routine screening mammogram detected a 1.3cm mass in the right breast, 9 o'clock position, no axillary adenopathy. 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 T1c N0 stage Ia  Recommendations: 1. 04/15/19: Breast conserving surgery: Right lumpectomy [name]): IDC, grade 2, 1.3cm, with intermediate grade DCIS, clear margins, and 1 right axillary lymph node negative 2.Adj chemo with Taxol-Herceptin foll by Herceptin maintenance for 1 yearstarted 05/13/2019 3. Adjuvant radiation therapy 08/20/2019-09/14/2019 4. Adjuvant antiestrogen therapy with anastrozole 1 mg daily  Enrolled inSWOG 1714 trials.  ------------------------------------------------------------------------------------------------------------------------- Current treatment: Herceptin maintenance (awaiting repeat echo results) EchoEF 55 to 60%04/15/2019 EF 30 to 35% on 07/20/2019  As we are waiting for the cardiac ejection fraction to improve, we will start her on antiestrogen therapy with anastrozole 1 mg daily which will be started April 2021.  Anastrozole counseling: We discussed the risks and benefits of anti-estrogen therapy with aromatase inhibitors. These include but not limited to insomnia, hot flashes, mood changes, vaginal dryness, bone density loss, and weight gain. We strongly believe that the benefits far outweigh the risks. Patient understands these risks and consented to starting treatment. Planned treatment duration is 7 years.  Return to clinic as soon as we get the green signal to start back on Herceptin.

## 2019-09-15 ENCOUNTER — Ambulatory Visit (HOSPITAL_BASED_OUTPATIENT_CLINIC_OR_DEPARTMENT_OTHER)
Admission: RE | Admit: 2019-09-15 | Discharge: 2019-09-15 | Disposition: A | Discharge status: Home / Self Care | Payer: Medicare Other | Source: Ambulatory Visit | Attending: Cardiology | Admitting: Cardiology

## 2019-09-15 ENCOUNTER — Ambulatory Visit
Admission: RE | Admit: 2019-09-15 | Discharge: 2019-09-15 | Disposition: A | Discharge status: Home / Self Care | Payer: Medicare Other | Source: Ambulatory Visit | Attending: Radiation Oncology | Admitting: Radiation Oncology

## 2019-09-15 ENCOUNTER — Other Ambulatory Visit (HOSPITAL_COMMUNITY): Payer: Self-pay

## 2019-09-15 ENCOUNTER — Encounter (HOSPITAL_COMMUNITY): Payer: Self-pay | Admitting: Cardiology

## 2019-09-15 ENCOUNTER — Encounter (INDEPENDENT_AMBULATORY_CARE_PROVIDER_SITE_OTHER): Payer: Self-pay

## 2019-09-15 ENCOUNTER — Other Ambulatory Visit: Payer: Self-pay

## 2019-09-15 ENCOUNTER — Ambulatory Visit (HOSPITAL_COMMUNITY)
Admission: RE | Admit: 2019-09-15 | Discharge: 2019-09-15 | Disposition: A | Discharge status: Home / Self Care | Payer: Medicare Other | Source: Ambulatory Visit | Attending: Cardiology | Admitting: Cardiology

## 2019-09-15 ENCOUNTER — Ambulatory Visit: Payer: Medicare Other

## 2019-09-15 VITALS — BP 102/66 | HR 97 | Wt 137.4 lb

## 2019-09-15 DIAGNOSIS — I340 Nonrheumatic mitral (valve) insufficiency: Secondary | ICD-10-CM | POA: Diagnosis not present

## 2019-09-15 DIAGNOSIS — E785 Hyperlipidemia, unspecified: Secondary | ICD-10-CM | POA: Insufficient documentation

## 2019-09-15 DIAGNOSIS — C50911 Malignant neoplasm of unspecified site of right female breast: Secondary | ICD-10-CM | POA: Insufficient documentation

## 2019-09-15 DIAGNOSIS — I447 Left bundle-branch block, unspecified: Secondary | ICD-10-CM

## 2019-09-15 DIAGNOSIS — C50411 Malignant neoplasm of upper-outer quadrant of right female breast: Secondary | ICD-10-CM | POA: Diagnosis not present

## 2019-09-15 DIAGNOSIS — Z7982 Long term (current) use of aspirin: Secondary | ICD-10-CM | POA: Insufficient documentation

## 2019-09-15 DIAGNOSIS — Z170 Estrogen receptor positive status [ER+]: Secondary | ICD-10-CM | POA: Diagnosis not present

## 2019-09-15 DIAGNOSIS — I5022 Chronic systolic (congestive) heart failure: Secondary | ICD-10-CM | POA: Insufficient documentation

## 2019-09-15 DIAGNOSIS — Z79899 Other long term (current) drug therapy: Secondary | ICD-10-CM | POA: Insufficient documentation

## 2019-09-15 DIAGNOSIS — Z8349 Family history of other endocrine, nutritional and metabolic diseases: Secondary | ICD-10-CM | POA: Diagnosis not present

## 2019-09-15 DIAGNOSIS — Z8249 Family history of ischemic heart disease and other diseases of the circulatory system: Secondary | ICD-10-CM | POA: Diagnosis not present

## 2019-09-15 DIAGNOSIS — I110 Hypertensive heart disease with heart failure: Secondary | ICD-10-CM | POA: Diagnosis not present

## 2019-09-15 DIAGNOSIS — Z510 Encounter for antineoplastic radiation therapy: Secondary | ICD-10-CM | POA: Diagnosis not present

## 2019-09-15 DIAGNOSIS — F329 Major depressive disorder, single episode, unspecified: Secondary | ICD-10-CM | POA: Diagnosis not present

## 2019-09-15 DIAGNOSIS — Z794 Long term (current) use of insulin: Secondary | ICD-10-CM | POA: Diagnosis not present

## 2019-09-15 DIAGNOSIS — Z823 Family history of stroke: Secondary | ICD-10-CM | POA: Insufficient documentation

## 2019-09-15 DIAGNOSIS — Z833 Family history of diabetes mellitus: Secondary | ICD-10-CM | POA: Diagnosis not present

## 2019-09-15 DIAGNOSIS — Z9221 Personal history of antineoplastic chemotherapy: Secondary | ICD-10-CM | POA: Insufficient documentation

## 2019-09-15 DIAGNOSIS — E119 Type 2 diabetes mellitus without complications: Secondary | ICD-10-CM | POA: Insufficient documentation

## 2019-09-15 DIAGNOSIS — Z8379 Family history of other diseases of the digestive system: Secondary | ICD-10-CM | POA: Diagnosis not present

## 2019-09-15 MED ORDER — CARVEDILOL 6.25 MG PO TABS: 6.2500 mg | ORAL_TABLET | Freq: Two times a day (BID) | ORAL | 3 refills | Status: DC

## 2019-09-15 NOTE — Progress Notes (Signed)
  Echocardiogram 2D Echocardiogram has been performed.  Margaret Norman 09/15/2019, 8:45 AM

## 2019-09-15 NOTE — Patient Instructions (Addendum)
INCREASE Coreg to 6.25mg  (1 tab) twice a day  Your physician recommends that you schedule a follow-up appointment in: 2 weeks after procedure with Dr Aundra Dubin.   GARAGE CODE: April 5009  [facility] [place] [id number] [place] [address] Dept: [phone] Loc: [phone]  Margaret Norman  09/15/2019  You are scheduled for a Cardiac Catheterization on Monday, April 5 with Dr. Loralie Champagne.  1. Please arrive at the [facility] (Main Entrance A) at [facility]: [address] [place], [place] [address] at 5:30 AM (This time is two hours before your procedure to ensure your preparation). Free valet parking service is available.   Special note: Every effort is made to have your procedure done on time. Please understand that emergencies sometimes delay scheduled procedures.  2. Diet: Do not eat or drink after midnight.    3. Labs: April 2nd, 2021 10:30am at [facility]   COVID: You will need a pre procedure COVID test     WHEN:  April 2nd, 2021 11:05am WHERE: [facility]       [place] [address]  This is a drive thru testing site, you will remain in your car. Be sure to get in the line FOR PROCEDURES Once you have been swabbed you will need to remain home in quarantine until you return for your procedure.   4. Medication instructions in preparation for your procedure:    Contrast Allergy: No   HOLD ALL medications on the morning of your procedure except for Aspirin with a small sip of water.    Take only half dose of insulin the night before your procedure   Do not take Diabetes Med Glucophage (Metformin) on the day of the procedure and HOLD 48 HOURS AFTER THE PROCEDURE.   On the morning of your procedure, take your Aspirin and any morning medicines NOT listed above.  You may use sips of water.  5. Plan for one night stay--bring  personal belongings. 6. Bring a current list of your medications and current insurance cards. 7. You MUST have a responsible person to drive you home. 8. Someone MUST be with you the first 24 hours after you arrive home or your discharge will be delayed. 9. Please wear clothes that are easy to get on and off and wear slip-on shoes.  Thank you for allowing [place] to care for you!   -- [facility]

## 2019-09-15 NOTE — H&P (View-Only) (Signed)
PCP: Murray, [name], FNP Oncology: Dr. Gudena HF cardiology: Dr. Alynn Ellithorpe   68 y.o. with history of DM, chronic LBBB, and breast cancer was referred by Dr. Gudena for workup of fall in EF.  She was diagnosed with right breast cancer, ER+/PR+/HER2+ in 9/20.  She had right lumpectomy in 10/20.  Starting in 11/20, she had been getting 12 cycles of Taxol/Herceptin to be followed by Herceptin alone to complete a year. Echo in 10/20 showed EF 55-60% per report (looked more like 50% on my review, likely due to septal-lateral dyssynchrony).  Her repeat echo for Herceptin screening was done in 1/21, showing EF down to 30-35% with septal-lateral dyssynchrony.  Herceptin has been stopped for now.  She is currently undergoing radiation therapy.   Echo was done today and reviewed, EF remains 30-35% with prominent septal-lateral dyssynchrony.  Patient has a strong family history of heart problems.  Father died from "ruptured aorta."  Sister had heart valve repair, one brother has a pacemaker, and another brother has AL amyloidosis.   She returns today for followup of cardiomyopathy.  She has shortness of breath with moderate housework.  Fatigues easily.  She is short of breath walking about 50 yards.  Rare lightheadedness.  No chest pain.  Weight is down 3 lbs.   Labs (1/21): K 3.8, creatinine 0.75 Labs (3/21): K 4.5, creatinine 0.71  ECG (personally reviewed): NSR, LBBB   PMH: 1. Depression 2. Type 2 diabetes 3. Chronic LBBB 4. H/o transient complete heart block: in 10/20 when she was having port-a-cath placed.  5. HTN 6. Hyperlipidemia.  7. Breast cancer: She was diagnosed with right breast cancer, ER+/PR+/HER2+ in 9/20.  She had right lumpectomy in 10/20.  Starting in 11/20, she has been getting 12 cycles of Taxol/Herceptin to be followed by Herceptin alone to complete a year.  8. Chronic systolic CHF: She has had mildly decreased EF in the past, possibly related to LBBB.  - Echo (4/17): EF  50-55% - Cardiolite (4/17): EF 48% - Echo (10/20): EF 55-60% (reviewed, think more like 50% but technically difficult).  - Echo (1/21): EF 30-35%, mild LVH, septal dyssynchrony.  - Echo (3/21): EF 30-35% with prominent septal-lateral dyssynchrony.   Social History   Socioeconomic History  . Marital status: Married    Spouse name: [name]  . Number of children: 2  . Years of education: Not on file  . Highest education level: Not on file  Occupational History  . Occupation: CSR    Employer: [employer]  . Occupation: retired  Tobacco Use  . Smoking status: Never Smoker  . Smokeless tobacco: Never Used  Substance and Sexual Activity  . Alcohol use: No  . Drug use: No  . Sexual activity: Not Currently  Other Topics Concern  . Not on file  Social History Narrative   Regular Exercise -  NO   Social Determinants of Health   Financial Resource Strain:   . Difficulty of Paying Living Expenses:   Food Insecurity: No Food Insecurity  . Worried About Running Out of Food in the Last Year: Never true  . Ran Out of Food in the Last Year: Never true  Transportation Needs:   . Lack of Transportation (Medical):   . Lack of Transportation (Non-Medical):   Physical Activity:   . Days of Exercise per Week:   . Minutes of Exercise per Session:   Stress: No Stress Concern Present  . Feeling of Stress : Not at all    Social Connections: Unknown  . Frequency of Communication with Friends and Family: Not on file  . Frequency of Social Gatherings with Friends and Family: Not on file  . Attends Religious Services: Not on file  . Active Member of Clubs or Organizations: Not on file  . Attends Club or Organization Meetings: Not on file  . Marital Status: Married  Intimate Partner Violence: Not At Risk  . Fear of Current or Ex-Partner: No  . Emotionally Abused: No  . Physically Abused: No  . Sexually Abused: No   Family History  Problem Relation Age of Onset  . Liver disease  Mother   . Dementia Mother   . Diabetes Mother        borderline  . Coronary artery disease Father   . Heart attack Father   . Hypertension Father   . Heart disease Father   . Cancer Other        leukemia  . Stroke Maternal Grandfather   . Hyperlipidemia Brother        Amyloidosis  . Diabetes Maternal Grandmother   . Cancer Paternal Uncle        unknown  . Heart attack Paternal Grandmother   . Heart attack Paternal Uncle   . Hypertension Brother   . Diabetes Daughter        borderline  . Colon cancer Neg Hx    Current Outpatient Medications  Medication Sig Dispense Refill  . acetaminophen (TYLENOL) 500 MG tablet Take 1,000 mg by mouth every 6 (six) hours as needed for moderate pain.    . [START ON 09/28/2019] anastrozole (ARIMIDEX) 1 MG tablet Take 1 tablet (1 mg total) by mouth daily. 90 tablet 3  . aspirin 81 MG EC tablet Take 81 mg by mouth daily.      . Blood Glucose Monitoring Suppl (ONETOUCH VERIO FLEX SYSTEM) W/DEVICE KIT 1 kit by Does not apply route 3 (three) times daily. 1 kit 1  . buPROPion (WELLBUTRIN XL) 150 MG 24 hr tablet Take 1 tablet (150 mg total) by mouth every morning. 90 tablet 3  . Cholecalciferol (VITAMIN D3) 1.25 MG (50000 UT) CAPS Take 1 capsule by mouth.    . Cyanocobalamin (VITAMIN B12 PO) Take 2,500 mcg by mouth daily.    . empagliflozin (JARDIANCE) 25 MG TABS tablet Take 25 mg by mouth daily. 30 tablet 11  . fluticasone (FLONASE) 50 MCG/ACT nasal spray Place 2 sprays into both nostrils daily. 16 g 6  . glucose blood (ONETOUCH VERIO) test strip Test three times daily as directed. DX: E11.9 300 each 3  . Insulin Pen Needle (B-D ULTRAFINE III SHORT PEN) 31G X 8 MM MISC USE DAILY WITH LANTUS  INSULIN 90 each 1  . LANTUS SOLOSTAR 100 UNIT/ML Solostar Pen INJECT SUBCUTANEOUSLY 30  UNITS AT BEDTIME 30 mL 3  . metFORMIN (GLUCOPHAGE) 1000 MG tablet TAKE 1 TABLET BY MOUTH  TWICE DAILY WITH MEALS 180 tablet 3  . omeprazole (PRILOSEC) 40 MG capsule TAKE 1 CAPSULE  BY MOUTH  DAILY 90 capsule 3  . ONE TOUCH LANCETS MISC Test three times daily as directed. DX: E11.9 300 each 3  . Probiotic Product (PROBIOTIC ADVANCED PO) Take 1 tablet by mouth at bedtime.     . pyridOXINE (VITAMIN B-6) 100 MG tablet Take 100 mg by mouth daily.     . rosuvastatin (CRESTOR) 10 MG tablet Take 1 tablet (10 mg total) by mouth daily. 90 tablet 3  . sacubitril-valsartan (ENTRESTO) 24-26 MG Take   1 tablet by mouth 2 (two) times daily. 60 tablet 5  . sertraline (ZOLOFT) 100 MG tablet Take 1 tablet (100 mg total) by mouth daily. 90 tablet 3  . spironolactone (ALDACTONE) 25 MG tablet Take 1 tablet (25 mg total) by mouth daily. 90 tablet 3  . thiamine 100 MG tablet Take by mouth.    . TRULICITY 3 MG/0.5ML SOPN INJECT 3 MG INTO THE SKIN  WEEKLY 12 pen 2  . Vitamin E 180 MG CAPS Take 180 mg by mouth daily.    . carvedilol (COREG) 6.25 MG tablet Take 1 tablet (6.25 mg total) by mouth 2 (two) times daily. 60 tablet 3   No current facility-administered medications for this encounter.   BP 102/66   Pulse 97   Wt 62.3 kg (137 lb 6.4 oz)   SpO2 95%   BMI 25.13 kg/m  General: NAD Neck: No JVD, no thyromegaly or thyroid nodule.  Lungs: Clear to auscultation bilaterally with normal respiratory effort. CV: Nondisplaced PMI.  Heart regular S1/S2, no S3/S4, no murmur.  No peripheral edema.  No carotid bruit.  Normal pedal pulses.  Abdomen: Soft, nontender, no hepatosplenomegaly, no distention.  Skin: Intact without lesions or rashes.  Neurologic: Alert and oriented x 3.  Psych: Normal affect. Extremities: No clubbing or cyanosis.  HEENT: Normal.   Assessment/Plan: 1. Chronic systolic CHF:  Echo in 1/21 showed EF down to 30-35% with septal-lateral dyssynchrony.  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  Based on the timeline, the most likely cause for fall in EF seems  to be Herceptin use.  She is now off Herceptin, but echo done today shows EF still in the 30-35% range.  NYHA class III, but she is not volume overloaded on exam.  - Continue Entresto 24/26 bid.  - Increase Coreg to 6.25 mg bid.  - Continue spironolactone 25 mg daily.  - As EF has not recovered off Herceptin, I will arrange for LHC/RHC to rule out coronary disease and assess hemodynamics.  We discussed risks/benefits of cath and she agrees.  - If no significant CAD, will arrange for cardiac MRI to assess for myocarditis or infiltrative disease like amyloidosis.  - I think that she should continue to stay off Herceptin with EF still moderately depressed.  - If EF does not recover, she will be a CRT-D candidate with LBBB.  2. 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  - Follow closely with use of Coreg.   3. LBBB: Chronic.  ?LBBB cardiomyopathy.  4. Breast cancer: Herceptin on hold for now.  If EF recovers, would be reasonable to retry Herceptin with close monitoring and ongoing use of cardiomyopathy meds. However, EF still remains low.   She will followup 2 wks after cath.   Lanier Felty 09/15/2019  

## 2019-09-15 NOTE — Progress Notes (Signed)
PCP: [name], FNP Oncology: Dr. Lindi Adie HF cardiology: Dr. Aundra Dubin   69 y.o. with history of DM, chronic LBBB, and breast cancer was referred by Dr. Lindi Adie for workup of fall in EF.  She was diagnosed with right breast cancer, ER+/PR+/HER2+ in 9/20.  She had right lumpectomy in 10/20.  Starting in 11/20, she had been getting 12 cycles of Taxol/Herceptin to be followed by Herceptin alone to complete a year. Echo in 10/20 showed EF 55-60% per report (looked more like 50% on my review, likely due to septal-lateral dyssynchrony).  Her repeat echo for Herceptin screening was done in 1/21, showing EF down to 30-35% with septal-lateral dyssynchrony.  Herceptin has been stopped for now.  She is currently undergoing radiation therapy.   Echo was done today and reviewed, EF remains 30-35% with prominent septal-lateral dyssynchrony.  Patient has a strong family history of heart problems.  Father died from "ruptured aorta."  Sister had heart valve repair, one brother has a pacemaker, and another brother has AL amyloidosis.   She returns today for followup of cardiomyopathy.  She has shortness of breath with moderate housework.  Fatigues easily.  She is short of breath walking about 50 yards.  Rare lightheadedness.  No chest pain.  Weight is down 3 lbs.   Labs (1/21): K 3.8, creatinine 0.75 Labs (3/21): K 4.5, creatinine 0.71  ECG (personally reviewed): NSR, LBBB   PMH: 1. Depression 2. Type 2 diabetes 3. Chronic LBBB 4. H/o transient complete heart block: in 10/20 when she was having port-a-cath placed.  5. HTN 6. Hyperlipidemia.  7. Breast cancer: She was diagnosed with right breast cancer, ER+/PR+/HER2+ in 9/20.  She had right lumpectomy in 10/20.  Starting in 11/20, she has been getting 12 cycles of Taxol/Herceptin to be followed by Herceptin alone to complete a year.  8. Chronic systolic CHF: She has had mildly decreased EF in the past, possibly related to LBBB.  - Echo (4/17): EF  50-55% - Cardiolite (4/17): EF 48% - Echo (10/20): EF 55-60% (reviewed, think more like 50% but technically difficult).  - Echo (1/21): EF 30-35%, mild LVH, septal dyssynchrony.  - Echo (3/21): EF 30-35% with prominent septal-lateral dyssynchrony.   Social History   Socioeconomic History  . Marital status: Married    Spouse name: Aiman Sonn  . Number of children: 2  . Years of education: Not on file  . Highest education level: Not on file  Occupational History  . Occupation: CSR    Employer: [employer]  . Occupation: retired  Tobacco Use  . Smoking status: Never Smoker  . Smokeless tobacco: Never Used  Substance and Sexual Activity  . Alcohol use: No  . Drug use: No  . Sexual activity: Not Currently  Other Topics Concern  . Not on file  Social History Narrative   Regular Exercise -  NO   Social Determinants of Health   Financial Resource Strain:   . Difficulty of Paying Living Expenses:   Food Insecurity: No Food Insecurity  . Worried About Charity fundraiser in the Last Year: Never true  . Ran Out of Food in the Last Year: Never true  Transportation Needs:   . Lack of Transportation (Medical):   Marland Kitchen Lack of Transportation (Non-Medical):   Physical Activity:   . Days of Exercise per Week:   . Minutes of Exercise per Session:   Stress: No Stress Concern Present  . Feeling of Stress : Not at all  Social Connections: Unknown  . Frequency of Communication with Friends and Family: Not on file  . Frequency of Social Gatherings with Friends and Family: Not on file  . Attends Religious Services: Not on file  . Active Member of Clubs or Organizations: Not on file  . Attends Archivist Meetings: Not on file  . Marital Status: Married  Human resources officer Violence: Not At Risk  . Fear of Current or Ex-Partner: No  . Emotionally Abused: No  . Physically Abused: No  . Sexually Abused: No   Family History  Problem Relation Age of Onset  . Liver disease  Mother   . Dementia Mother   . Diabetes Mother        borderline  . Coronary artery disease Father   . Heart attack Father   . Hypertension Father   . Heart disease Father   . Cancer Other        leukemia  . Stroke Maternal Grandfather   . Hyperlipidemia Brother        Amyloidosis  . Diabetes Maternal Grandmother   . Cancer Paternal Uncle        unknown  . Heart attack Paternal Grandmother   . Heart attack Paternal Uncle   . Hypertension Brother   . Diabetes Daughter        borderline  . Colon cancer Neg Hx    Current Outpatient Medications  Medication Sig Dispense Refill  . acetaminophen (TYLENOL) 500 MG tablet Take 1,000 mg by mouth every 6 (six) hours as needed for moderate pain.    Derrill Memo ON 09/28/2019] anastrozole (ARIMIDEX) 1 MG tablet Take 1 tablet (1 mg total) by mouth daily. 90 tablet 3  . aspirin 81 MG EC tablet Take 81 mg by mouth daily.      . Blood Glucose Monitoring Suppl (ONETOUCH VERIO FLEX SYSTEM) W/DEVICE KIT 1 kit by Does not apply route 3 (three) times daily. 1 kit 1  . buPROPion (WELLBUTRIN XL) 150 MG 24 hr tablet Take 1 tablet (150 mg total) by mouth every morning. 90 tablet 3  . Cholecalciferol (VITAMIN D3) 1.25 MG (50000 UT) CAPS Take 1 capsule by mouth.    . Cyanocobalamin (VITAMIN B12 PO) Take 2,500 mcg by mouth daily.    . empagliflozin (JARDIANCE) 25 MG TABS tablet Take 25 mg by mouth daily. 30 tablet 11  . fluticasone (FLONASE) 50 MCG/ACT nasal spray Place 2 sprays into both nostrils daily. 16 g 6  . glucose blood (ONETOUCH VERIO) test strip Test three times daily as directed. DX: E11.9 300 each 3  . Insulin Pen Needle (B-D ULTRAFINE III SHORT PEN) 31G X 8 MM MISC USE DAILY WITH LANTUS  INSULIN 90 each 1  . LANTUS SOLOSTAR 100 UNIT/ML Solostar Pen INJECT SUBCUTANEOUSLY 30  UNITS AT BEDTIME 30 mL 3  . metFORMIN (GLUCOPHAGE) 1000 MG tablet TAKE 1 TABLET BY MOUTH  TWICE DAILY WITH MEALS 180 tablet 3  . omeprazole (PRILOSEC) 40 MG capsule TAKE 1 CAPSULE  BY MOUTH  DAILY 90 capsule 3  . ONE TOUCH LANCETS MISC Test three times daily as directed. DX: E11.9 300 each 3  . Probiotic Product (PROBIOTIC ADVANCED PO) Take 1 tablet by mouth at bedtime.     . pyridOXINE (VITAMIN B-6) 100 MG tablet Take 100 mg by mouth daily.     . rosuvastatin (CRESTOR) 10 MG tablet Take 1 tablet (10 mg total) by mouth daily. 90 tablet 3  . sacubitril-valsartan (ENTRESTO) 24-26 MG Take  1 tablet by mouth 2 (two) times daily. 60 tablet 5  . sertraline (ZOLOFT) 100 MG tablet Take 1 tablet (100 mg total) by mouth daily. 90 tablet 3  . spironolactone (ALDACTONE) 25 MG tablet Take 1 tablet (25 mg total) by mouth daily. 90 tablet 3  . thiamine 100 MG tablet Take by mouth.    . TRULICITY 3 RV/2.0EB SOPN INJECT 3 MG INTO THE SKIN  WEEKLY 12 pen 2  . Vitamin E 180 MG CAPS Take 180 mg by mouth daily.    . carvedilol (COREG) 6.25 MG tablet Take 1 tablet (6.25 mg total) by mouth 2 (two) times daily. 60 tablet 3   No current facility-administered medications for this encounter.   BP 102/66   Pulse 97   Wt 62.3 kg (137 lb 6.4 oz)   SpO2 95%   BMI 25.13 kg/m  General: NAD Neck: No JVD, no thyromegaly or thyroid nodule.  Lungs: Clear to auscultation bilaterally with normal respiratory effort. CV: Nondisplaced PMI.  Heart regular S1/S2, no S3/S4, no murmur.  No peripheral edema.  No carotid bruit.  Normal pedal pulses.  Abdomen: Soft, nontender, no hepatosplenomegaly, no distention.  Skin: Intact without lesions or rashes.  Neurologic: Alert and oriented x 3.  Psych: Normal affect. Extremities: No clubbing or cyanosis.  HEENT: Normal.   Assessment/Plan: 1. Chronic systolic CHF:  Echo in 3/43 showed EF down to 30-35% with septal-lateral dyssynchrony.  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  Based on the timeline, the most likely cause for fall in EF seems  to be Herceptin use.  She is now off Herceptin, but echo done today shows EF still in the 30-35% range.  NYHA class III, but she is not volume overloaded on exam.  - Continue Entresto 24/26 bid.  - Increase Coreg to 6.25 mg bid.  - Continue spironolactone 25 mg daily.  - As EF has not recovered off Herceptin, I will arrange for LHC/RHC to rule out coronary disease and assess hemodynamics.  We discussed risks/benefits of cath and she agrees.  - If no significant CAD, will arrange for cardiac MRI to assess for myocarditis or infiltrative disease like amyloidosis.  - I think that she should continue to stay off Herceptin with EF still moderately depressed.  - If EF does not recover, she will be a CRT-D candidate with LBBB.  2. 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  - Follow closely with use of Coreg.   3. LBBB: Chronic.  ?LBBB cardiomyopathy.  4. Breast cancer: Herceptin on hold for now.  If EF recovers, would be reasonable to retry Herceptin with close monitoring and ongoing use of cardiomyopathy meds. However, EF still remains low.   She will followup 2 wks after cath.   Loralie Champagne 09/15/2019

## 2019-09-16 ENCOUNTER — Other Ambulatory Visit: Payer: Self-pay

## 2019-09-16 ENCOUNTER — Ambulatory Visit
Admission: RE | Admit: 2019-09-16 | Discharge: 2019-09-16 | Disposition: A | Discharge status: Home / Self Care | Payer: Medicare Other | Source: Ambulatory Visit | Attending: Radiation Oncology | Admitting: Radiation Oncology

## 2019-09-16 ENCOUNTER — Telehealth: Payer: Self-pay | Admitting: Adult Health

## 2019-09-16 ENCOUNTER — Encounter: Payer: Self-pay | Admitting: Radiation Oncology

## 2019-09-16 ENCOUNTER — Encounter: Payer: Self-pay | Admitting: *Deleted

## 2019-09-16 DIAGNOSIS — C50411 Malignant neoplasm of upper-outer quadrant of right female breast: Secondary | ICD-10-CM | POA: Diagnosis not present

## 2019-09-16 DIAGNOSIS — Z510 Encounter for antineoplastic radiation therapy: Secondary | ICD-10-CM | POA: Diagnosis not present

## 2019-09-16 NOTE — Telephone Encounter (Signed)
Called patient and reviewed AET study with her over my chart care companion.  She notes that she understands how to do the study and has no questions at this time.  She has my phone number to call me if she has any questions or concerns.  Wilber Bihari, NP

## 2019-09-17 ENCOUNTER — Ambulatory Visit: Payer: Medicare Other | Admitting: Internal Medicine

## 2019-09-17 ENCOUNTER — Encounter: Payer: Self-pay | Admitting: Internal Medicine

## 2019-09-17 VITALS — BP 110/70 | HR 85 | Ht 62.0 in | Wt 136.0 lb

## 2019-09-17 DIAGNOSIS — Z794 Long term (current) use of insulin: Secondary | ICD-10-CM

## 2019-09-17 DIAGNOSIS — E785 Hyperlipidemia, unspecified: Secondary | ICD-10-CM

## 2019-09-17 DIAGNOSIS — E1165 Type 2 diabetes mellitus with hyperglycemia: Secondary | ICD-10-CM

## 2019-09-17 DIAGNOSIS — E010 Iodine-deficiency related diffuse (endemic) goiter: Secondary | ICD-10-CM | POA: Diagnosis not present

## 2019-09-17 LAB — POCT GLYCOSYLATED HEMOGLOBIN (HGB A1C): Hemoglobin A1C: 6.6 % — AB (ref 4.0–5.6)

## 2019-09-17 MED ORDER — JARDIANCE 25 MG PO TABS: 25.0000 mg | ORAL_TABLET | Freq: Every day | ORAL | 3 refills | Status: DC

## 2019-09-17 NOTE — Addendum Note (Signed)
Addended by: Cardell Peach I on: 09/17/2019 01:27 PM   Modules accepted: Orders

## 2019-09-17 NOTE — Progress Notes (Signed)
Patient ID: Margaret Norman, female   DOB: July 29, 1950, 69 y.o.   MRN: [id number]   This visit occurred during the SARS-CoV-2 public health emergency.  Safety protocols were in place, including screening questions prior to the visit, additional usage of staff PPE, and extensive cleaning of exam room while observing appropriate contact time as indicated for disinfecting solutions.   HPI: Margaret Norman is a 69 y.o.-year-old female, returning for follow-up for DM2, dx in 2008, insulin-dependent since 2016, uncontrolled, without long term complications.  Last visit almost 5 months ago  At last visit, she just found out that she had breast cancer. She had surgery >> 12 weeks of ChTx >> 1 mo of RxTx.  She now finished with these.  She did have some higher blood sugars during the treatments.  Reviewed HbA1c levels: Lab Results  Component Value Date   HGBA1C 8.6 (H) 04/10/2019   HGBA1C 7.4 (A) 04/01/2019   HGBA1C 7.1 (A) 12/17/2018  03/19/2017: HbA1c calculated from fructosamine is 6.7%! 12/31/2016: HbA1c calculated from fructosamine: 6.9%!  Pt is on: - Metformin 1000 mg 2x a day with meals - Lantus 30 units at night - Trulicity 1.5 >> 3 mg weekly - Jardiance 25 mg daily before breakfast Stopped Actos when started trulicity back. She was on Trulicity but became expensive >> changed to Actos. She was on Invokana >> recurrent UTIs.  Pt checks her sugars 1-3 times a day - improved in last 2 mo: - am: 115, 123, 138 >> 87-146, 192, 207 >> 93-167, 183 (snack at bedtime) - 2h after b'fast:  147-182 >> 113-163, 208 >> 161-190 - before lunch: 186 >> 128-174 >> 199 >> n/c - 2h after lunch: n/c >> 186- 194 >> 161 >> 176 - before dinner: n/c >> 163, 168 >> 240 (?) - 2h after dinner: n/c >> 292 >> n/c - bedtime: 257 x1 >> 234 >> n/c >> 118, 125 >> 148-167 - nighttime: n/c >> 172 >> 178 >> n/c Lowest sugar was 87 >> 93; she has hypoglycemia awareness in the 70s. Highest sugar was 292 >> 240 x1,  190.  Glucometer: OneTouch Verio  -No CKD, last BUN/creatinine:  Lab Results  Component Value Date   BUN 12 08/24/2019   BUN 8 08/10/2019   CREATININE 0.71 08/24/2019   CREATININE 0.67 08/10/2019  On Entresto. -+ HL; last set of lipids: Lab Results  Component Value Date   CHOL 101 02/02/2019   HDL 47.40 02/02/2019   LDLCALC 38 02/02/2019   LDLDIRECT 137.9 02/04/2013   TRIG 78.0 02/02/2019   CHOLHDL 2 02/02/2019  On Crestor 10. - last eye exam was in 06/2019: No DR, + OS cataract.  Dr. Marica Otter. - no numbness and tingling in her feet.  She has restless leg syndrome.  Pt has FH of DM in M[place].  She continues to have a lot of stress at home with her grandson who 73, is bipolar, and was laid off from work - who is living with her and her husband along with patient's daughter.   She sees Cardiology - Dr. Aundra Dubin >> started Entresto, Coreg  ROS: Constitutional: no weight gain/no weight loss, no fatigue, no subjective hyperthermia, no subjective hypothermia Eyes: no blurry vision, no xerophthalmia ENT: no sore throat, no nodules palpated in neck, no dysphagia, no odynophagia, no hoarseness Cardiovascular: no CP/no SOB/no palpitations/no leg swelling Respiratory: no cough/no SOB/no wheezing Gastrointestinal: no N/no V/no D/no C/no acid reflux Musculoskeletal: no muscle aches/no joint aches Skin: no  rashes, no hair loss Neurological: no tremors/no numbness/no tingling/no dizziness  I reviewed pt's medications, allergies, PMH, social hx, family hx, and changes were documented in the history of present illness. Otherwise, unchanged from my initial visit note.  Past Medical History:  Diagnosis Date  . Arthritis   . Chest pain    Nuclear, adenosine,  December, 2013, low risk nuclear scan with small, moderate in intensity, fixed anteroseptal defect. This is possibly related to an LBBB versus small prior infarct. No ischemia  . Depression   . Gallstones 11-06  . GERD  (gastroesophageal reflux disease)   . History of kidney stones   . HTN (hypertension)   . Hyperlipidemia   . Kidney mass 01/28/2014  . LBBB (left bundle branch block)   . Nephrolithiasis   . Pneumonia   . 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 Type II or unspecified type diabetes mellitus without mention of complication, not stated as uncontrolled    Past Surgical History:  Procedure Laterality Date  . APPENDECTOMY    . BREAST LUMPECTOMY WITH RADIOACTIVE SEED AND SENTINEL LYMPH NODE BIOPSY Right 04/15/2019   Procedure: RIGHT BREAST PARTIAL MASTECTOMY WITH RADIOACTIVE SEED AND SENTINEL LYMPH NODE BIOPSY;  Surgeon: Coralie Keens, MD;  Location: [facility];  Service: General;  Laterality: Right;  . CESAREAN SECTION     x2 ? w/appy  . CHOLECYSTECTOMY    . ESOPHAGOGASTRODUODENOSCOPY    . HERNIA REPAIR    . KNEE ARTHROSCOPY Right 11/2005  . LIVER BIOPSY    . PANCREATIC CYST EXCISION    . PORTACATH PLACEMENT Left 04/15/2019   Procedure: INSERTION PORT-A-CATH WITH ULTRASOUND;  Surgeon: Coralie Keens, MD;  Location: [facility];  Service: General;  Laterality: Left;  . SPLENECTOMY    . TONSILLECTOMY AND ADENOIDECTOMY     Social History   Socioeconomic History  . Marital status: Married    Spouse name: Margaret Norman  . Number of children: 2  . Years of education: Not on file  . Highest education level: Not on file  Occupational History  . Occupation: CSR    Employer: [employer]  . Occupation: retired  Tobacco Use  . Smoking status: Never Smoker  . Smokeless tobacco: Never Used  Substance and Sexual Activity  . Alcohol use: No  . Drug use: No  . Sexual activity: Not Currently  Other Topics Concern  . Not on file  Social History Narrative   Regular Exercise -  NO   Social Determinants of Health   Financial Resource Strain:   . Difficulty of Paying Living Expenses:   Food Insecurity: No Food Insecurity  . Worried About Charity fundraiser in the Last Year:  Never true  . Ran Out of Food in the Last Year: Never true  Transportation Needs:   . Lack of Transportation (Medical):   Marland Kitchen Lack of Transportation (Non-Medical):   Physical Activity:   . Days of Exercise per Week:   . Minutes of Exercise per Session:   Stress: No Stress Concern Present  . Feeling of Stress : Not at all  Social Connections: Unknown  . Frequency of Communication with Friends and Family: Not on file  . Frequency of Social Gatherings with Friends and Family: Not on file  . Attends Religious Services: Not on file  . Active Member of Clubs or Organizations: Not on file  . Attends Archivist Meetings: Not on file  . Marital Status: Married  Human resources officer Violence: Not At Risk  .  Fear of Current or Ex-Partner: No  . Emotionally Abused: No  . Physically Abused: No  . Sexually Abused: No   Current Outpatient Medications on File Prior to Visit  Medication Sig Dispense Refill  . acetaminophen (TYLENOL) 500 MG tablet Take 1,000 mg by mouth every 6 (six) hours as needed for moderate pain.    Derrill Memo ON 09/28/2019] anastrozole (ARIMIDEX) 1 MG tablet Take 1 tablet (1 mg total) by mouth daily. 90 tablet 3  . aspirin 81 MG EC tablet Take 81 mg by mouth daily.      . Blood Glucose Monitoring Suppl (ONETOUCH VERIO FLEX SYSTEM) W/DEVICE KIT 1 kit by Does not apply route 3 (three) times daily. 1 kit 1  . buPROPion (WELLBUTRIN XL) 150 MG 24 hr tablet Take 1 tablet (150 mg total) by mouth every morning. 90 tablet 3  . carvedilol (COREG) 6.25 MG tablet Take 1 tablet (6.25 mg total) by mouth 2 (two) times daily. 60 tablet 3  . Cholecalciferol (VITAMIN D3) 1.25 MG (50000 UT) CAPS Take 1 capsule by mouth.    . Cyanocobalamin (VITAMIN B12 PO) Take 2,500 mcg by mouth daily.    . empagliflozin (JARDIANCE) 25 MG TABS tablet Take 25 mg by mouth daily. 30 tablet 11  . fluticasone (FLONASE) 50 MCG/ACT nasal spray Place 2 sprays into both nostrils daily. 16 g 6  . glucose blood (ONETOUCH  VERIO) test strip Test three times daily as directed. DX: E11.9 300 each 3  . Insulin Pen Needle (B-D ULTRAFINE III SHORT PEN) 31G X 8 MM MISC USE DAILY WITH LANTUS  INSULIN 90 each 1  . LANTUS SOLOSTAR 100 UNIT/ML Solostar Pen INJECT SUBCUTANEOUSLY 30  UNITS AT BEDTIME 30 mL 3  . metFORMIN (GLUCOPHAGE) 1000 MG tablet TAKE 1 TABLET BY MOUTH  TWICE DAILY WITH MEALS 180 tablet 3  . omeprazole (PRILOSEC) 40 MG capsule TAKE 1 CAPSULE BY MOUTH  DAILY 90 capsule 3  . ONE TOUCH LANCETS MISC Test three times daily as directed. DX: E11.9 300 each 3  . Probiotic Product (PROBIOTIC ADVANCED PO) Take 1 tablet by mouth at bedtime.     . pyridOXINE (VITAMIN B-6) 100 MG tablet Take 100 mg by mouth daily.     . rosuvastatin (CRESTOR) 10 MG tablet Take 1 tablet (10 mg total) by mouth daily. 90 tablet 3  . sacubitril-valsartan (ENTRESTO) 24-26 MG Take 1 tablet by mouth 2 (two) times daily. 60 tablet 5  . sertraline (ZOLOFT) 100 MG tablet Take 1 tablet (100 mg total) by mouth daily. 90 tablet 3  . spironolactone (ALDACTONE) 25 MG tablet Take 1 tablet (25 mg total) by mouth daily. 90 tablet 3  . thiamine 100 MG tablet Take by mouth.    . TRULICITY 3 ZO/1.0RU SOPN INJECT 3 MG INTO THE SKIN  WEEKLY 12 pen 2  . Vitamin E 180 MG CAPS Take 180 mg by mouth daily.     No current facility-administered medications on file prior to visit.   Allergies  Allergen Reactions  . Invokana [Canagliflozin] Other (See Comments)    UTI's  . Amoxicillin Other (See Comments)    Sore tongue Did it involve swelling of the face/tongue/throat, SOB, or low BP? No Did it involve sudden or severe rash/hives, skin peeling, or any reaction on the inside of your mouth or nose? No Did you need to seek medical attention at a hospital or doctor's office? No When did it last happen?5-10 years ago If all above answers  are "NO", may proceed with cephalosporin use.    Family History  Problem Relation Age of Onset  . Liver disease Mother    . Dementia Mother   . Diabetes Mother        borderline  . Coronary artery disease Father   . Heart attack Father   . Hypertension Father   . Heart disease Father   . Cancer Other        leukemia  . Stroke Maternal Grandfather   . Hyperlipidemia Brother        Amyloidosis  . Diabetes Maternal Grandmother   . Cancer Paternal Uncle        unknown  . Heart attack Paternal Grandmother   . Heart attack Paternal Uncle   . Hypertension Brother   . Diabetes Daughter        borderline  . Colon cancer Neg Hx     PE: BP 110/70   Pulse 85   Ht 5' 2"  (1.575 m)   Wt 136 lb (61.7 kg)   SpO2 96%   BMI 24.87 kg/m   Wt Readings from Last 3 Encounters:  09/17/19 136 lb (61.7 kg)  09/15/19 137 lb 6.4 oz (62.3 kg)  09/14/19 137 lb 12.8 oz (62.5 kg)   Constitutional: normal weight, in NAD Eyes: PERRLA, EOMI, no exophthalmos ENT: moist mucous membranes, no thyromegaly, no cervical lymphadenopathy Cardiovascular: RRR, No MRG Respiratory: CTA B Gastrointestinal: abdomen soft, NT, ND, BS+ Musculoskeletal: no deformities, strength intact in all 4 Skin: moist, warm, no rashes Neurological: no tremor with outstretched hands, DTR normal in all 4   ASSESSMENT: 1. DM2, insulin-dependent, uncontrolled, without long term complications, but with hyperglycemia  2. HL  PLAN:  1. Patient with longstanding, uncontrolled, type 2 diabetes, on oral antidiabetic regimen, long-acting insulin, and weekly GLP-1 receptor agonist, which we increased at last visit.  At last visit, HbA1c was 7.4%, higher than before.  However, later that month in 03/2019 she also had an HbA1c that was much higher, at 8.6%.  At that time, she was still eating out and eating a lot of sweets and sugars remain variable, with the majority of them after close to goal but also with hypoglycemic spikes.  Other than reducing her dietary indiscretions, we do not have medications to completely cover these hyperglycemic peaks but I did  advise him to increase Trulicity. -At this visit, sugars are out of close to goal especially in the last 2 months.  She still has some higher blood sugars in the morning, usually due to snacking at night, but overall, sugars appear to be reasonably controlled.  She did have a high blood sugar in the 200s, but not in the last 2 months.  We will not change her regimen at this visit, but I did advise her again to try to not snack at night - I suggested to:  Patient Instructions  Please  continue: - Metformin 1000 mg 2x a day with meals - Jardiance 25 mg daily before breakfast - Trulicity 3 mg weekly - Lantus 30 units at night  Try not to snack at night.  Please return in 3-4 months with your sugar log.   - we checked her HbA1c: 6.6% (much better) - advised to check sugars at different times of the day - 1-2x a day, rotating check times - advised for yearly eye exams >> she is UTD - return to clinic in 3-4 months     2. HL -Reviewed latest lipid panel from  01/2019: All fractions at goal Lab Results  Component Value Date   CHOL 101 02/02/2019   HDL 47.40 02/02/2019   LDLCALC 38 02/02/2019   LDLDIRECT 137.9 02/04/2013   TRIG 78.0 02/02/2019   CHOLHDL 2 02/02/2019  -Continues the statin without side effects.   Philemon Kingdom, MD PhD [facility] Endocrinology

## 2019-09-17 NOTE — Patient Instructions (Signed)
Please  continue: - Metformin 1000 mg 2x a day with meals - Jardiance 25 mg daily before breakfast - Trulicity 3 mg weekly - Lantus 30 units at night  Try not to snack at night.  Please return in 3-4 months with your sugar log.

## 2019-09-25 ENCOUNTER — Other Ambulatory Visit: Payer: Self-pay

## 2019-09-25 ENCOUNTER — Other Ambulatory Visit (HOSPITAL_COMMUNITY)
Admission: RE | Admit: 2019-09-25 | Discharge: 2019-09-25 | Disposition: A | Discharge status: Home / Self Care | Payer: Medicare Other | Source: Ambulatory Visit | Attending: Cardiology | Admitting: Cardiology

## 2019-09-25 ENCOUNTER — Ambulatory Visit (HOSPITAL_COMMUNITY)
Admission: RE | Admit: 2019-09-25 | Discharge: 2019-09-25 | Disposition: A | Discharge status: Home / Self Care | Payer: Medicare Other | Source: Ambulatory Visit | Attending: Internal Medicine | Admitting: Internal Medicine

## 2019-09-25 DIAGNOSIS — Z20822 Contact with and (suspected) exposure to covid-19: Secondary | ICD-10-CM | POA: Diagnosis not present

## 2019-09-25 DIAGNOSIS — C50411 Malignant neoplasm of upper-outer quadrant of right female breast: Secondary | ICD-10-CM | POA: Diagnosis not present

## 2019-09-25 DIAGNOSIS — Z170 Estrogen receptor positive status [ER+]: Secondary | ICD-10-CM | POA: Diagnosis not present

## 2019-09-25 LAB — BASIC METABOLIC PANEL
Anion gap: 13 (ref 5–15)
BUN: 12 mg/dL (ref 8–23)
CO2: 22 mmol/L (ref 22–32)
Calcium: 9.6 mg/dL (ref 8.9–10.3)
Chloride: 108 mmol/L (ref 98–111)
Creatinine, Ser: 0.82 mg/dL (ref 0.44–1.00)
GFR calc Af Amer: >60 mL/min (ref >60)
GFR calc non Af Amer: >60 mL/min (ref >60)
Glucose, Bld: 189 mg/dL — ABNORMAL HIGH (ref 70–99)
Potassium: 4.3 mmol/L (ref 3.5–5.1)
Sodium: 143 mmol/L (ref 135–145)

## 2019-09-25 LAB — SARS CORONAVIRUS 2 (TAT 6-24 HRS): SARS Coronavirus 2: NEGATIVE

## 2019-09-25 LAB — CBC
HCT: 37.8 % (ref 36.0–46.0)
Hemoglobin: 11.9 g/dL — ABNORMAL LOW (ref 12.0–15.0)
MCH: 29.7 pg (ref 26.0–34.0)
MCHC: 31.5 g/dL (ref 30.0–36.0)
MCV: 94.3 fL (ref 80.0–100.0)
Platelets: 427 10*3/uL — ABNORMAL HIGH (ref 150–400)
RBC: 4.01 MIL/uL (ref 3.87–5.11)
RDW: 14.1 % (ref 11.5–15.5)
WBC: 14.1 10*3/uL — ABNORMAL HIGH (ref 4.0–10.5)
nRBC: 0 % (ref 0.0–0.2)

## 2019-09-25 LAB — PROTIME-INR
INR: 1.1 (ref 0.8–1.2)
Prothrombin Time: 14.1 seconds (ref 11.4–15.2)

## 2019-09-28 ENCOUNTER — Ambulatory Visit (HOSPITAL_COMMUNITY)
Admission: RE | Admit: 2019-09-28 | Discharge: 2019-09-28 | Disposition: A | Discharge status: Home / Self Care | Payer: Medicare Other | Source: Ambulatory Visit | Attending: Cardiology | Admitting: Cardiology

## 2019-09-28 ENCOUNTER — Other Ambulatory Visit: Payer: Self-pay

## 2019-09-28 ENCOUNTER — Encounter (HOSPITAL_COMMUNITY)
Admission: RE | Disposition: A | Discharge status: Home / Self Care | Payer: Self-pay | Source: Ambulatory Visit | Attending: Cardiology

## 2019-09-28 ENCOUNTER — Telehealth (HOSPITAL_COMMUNITY): Payer: Self-pay

## 2019-09-28 DIAGNOSIS — Z79899 Other long term (current) drug therapy: Secondary | ICD-10-CM | POA: Diagnosis not present

## 2019-09-28 DIAGNOSIS — I5022 Chronic systolic (congestive) heart failure: Secondary | ICD-10-CM

## 2019-09-28 DIAGNOSIS — Z794 Long term (current) use of insulin: Secondary | ICD-10-CM | POA: Insufficient documentation

## 2019-09-28 DIAGNOSIS — E119 Type 2 diabetes mellitus without complications: Secondary | ICD-10-CM | POA: Diagnosis not present

## 2019-09-28 DIAGNOSIS — I429 Cardiomyopathy, unspecified: Secondary | ICD-10-CM | POA: Insufficient documentation

## 2019-09-28 DIAGNOSIS — Z923 Personal history of irradiation: Secondary | ICD-10-CM | POA: Diagnosis not present

## 2019-09-28 DIAGNOSIS — I447 Left bundle-branch block, unspecified: Secondary | ICD-10-CM | POA: Insufficient documentation

## 2019-09-28 DIAGNOSIS — I110 Hypertensive heart disease with heart failure: Secondary | ICD-10-CM | POA: Insufficient documentation

## 2019-09-28 DIAGNOSIS — E785 Hyperlipidemia, unspecified: Secondary | ICD-10-CM | POA: Insufficient documentation

## 2019-09-28 DIAGNOSIS — I428 Other cardiomyopathies: Secondary | ICD-10-CM

## 2019-09-28 DIAGNOSIS — F329 Major depressive disorder, single episode, unspecified: Secondary | ICD-10-CM | POA: Diagnosis not present

## 2019-09-28 DIAGNOSIS — Z7982 Long term (current) use of aspirin: Secondary | ICD-10-CM | POA: Insufficient documentation

## 2019-09-28 DIAGNOSIS — C50911 Malignant neoplasm of unspecified site of right female breast: Secondary | ICD-10-CM | POA: Diagnosis not present

## 2019-09-28 HISTORY — PX: RIGHT/LEFT HEART CATH AND CORONARY ANGIOGRAPHY: CATH118266

## 2019-09-28 LAB — POCT I-STAT EG7
Acid-Base Excess: 1 mmol/L (ref 0.0–2.0)
Bicarbonate: 26.1 mmol/L (ref 20.0–28.0)
Bicarbonate: 26.4 mmol/L (ref 20.0–28.0)
Calcium, Ion: 1.28 mmol/L (ref 1.15–1.40)
Calcium, Ion: 1.3 mmol/L (ref 1.15–1.40)
HCT: 34 % — ABNORMAL LOW (ref 36.0–46.0)
HCT: 34 % — ABNORMAL LOW (ref 36.0–46.0)
Hemoglobin: 11.6 g/dL — ABNORMAL LOW (ref 12.0–15.0)
Hemoglobin: 11.6 g/dL — ABNORMAL LOW (ref 12.0–15.0)
O2 Saturation: 77 %
O2 Saturation: 77 %
Potassium: 3.7 mmol/L (ref 3.5–5.1)
Potassium: 3.7 mmol/L (ref 3.5–5.1)
Sodium: 143 mmol/L (ref 135–145)
Sodium: 143 mmol/L (ref 135–145)
TCO2: 28 mmol/L (ref 22–32)
TCO2: 28 mmol/L (ref 22–32)
pCO2, Ven: 46.3 mmHg (ref 44.0–60.0)
pCO2, Ven: 46.2 mmHg (ref 44.0–60.0)
pH, Ven: 7.36 (ref 7.250–7.430)
pH, Ven: 7.364 (ref 7.250–7.430)
pO2, Ven: 44 mmHg (ref 32.0–45.0)
pO2, Ven: 44 mmHg (ref 32.0–45.0)

## 2019-09-28 LAB — GLUCOSE, CAPILLARY: Glucose-Capillary: 132 mg/dL — ABNORMAL HIGH (ref 70–99)

## 2019-09-28 SURGERY — RIGHT/LEFT HEART CATH AND CORONARY ANGIOGRAPHY
Anesthesia: LOCAL

## 2019-09-28 MED ORDER — SODIUM CHLORIDE 0.9 % IV SOLN: 250.0000 mL | INTRAVENOUS | Status: DC | PRN

## 2019-09-28 MED ORDER — ONDANSETRON HCL 4 MG/2ML IJ SOLN: 4.0000 mg | Freq: Four times a day (QID) | INTRAMUSCULAR | Status: DC | PRN

## 2019-09-28 MED ORDER — LIDOCAINE HCL (PF) 1 % IJ SOLN
INTRAMUSCULAR | Status: AC
  Filled 2019-09-28: qty 30

## 2019-09-28 MED ORDER — VERAPAMIL HCL 2.5 MG/ML IV SOLN
INTRAVENOUS | Status: DC | PRN
  Administered 2019-09-28: 10 mL via INTRA_ARTERIAL

## 2019-09-28 MED ORDER — HYDRALAZINE HCL 20 MG/ML IJ SOLN: 10.0000 mg | INTRAMUSCULAR | Status: DC | PRN

## 2019-09-28 MED ORDER — HEPARIN SODIUM (PORCINE) 1000 UNIT/ML IJ SOLN
INTRAMUSCULAR | Status: DC | PRN
  Administered 2019-09-28: 3000 [IU] via INTRAVENOUS

## 2019-09-28 MED ORDER — ACETAMINOPHEN 325 MG PO TABS: 650.0000 mg | ORAL_TABLET | ORAL | Status: DC | PRN

## 2019-09-28 MED ORDER — SODIUM CHLORIDE 0.9 % IV SOLN: INTRAVENOUS | Status: DC

## 2019-09-28 MED ORDER — SODIUM CHLORIDE 0.9% FLUSH: 3.0000 mL | INTRAVENOUS | Status: DC | PRN

## 2019-09-28 MED ORDER — LIDOCAINE HCL (PF) 1 % IJ SOLN
INTRAMUSCULAR | Status: DC | PRN
  Administered 2019-09-28 (×2): 2 mL via SUBCUTANEOUS

## 2019-09-28 MED ORDER — HEPARIN SODIUM (PORCINE) 1000 UNIT/ML IJ SOLN
INTRAMUSCULAR | Status: AC
  Filled 2019-09-28: qty 1

## 2019-09-28 MED ORDER — VERAPAMIL HCL 2.5 MG/ML IV SOLN
INTRAVENOUS | Status: AC
  Filled 2019-09-28: qty 2

## 2019-09-28 MED ORDER — LABETALOL HCL 5 MG/ML IV SOLN: 10.0000 mg | INTRAVENOUS | Status: DC | PRN

## 2019-09-28 MED ORDER — IOHEXOL 350 MG/ML SOLN
INTRAVENOUS | Status: DC | PRN
  Administered 2019-09-28: 30 mL via INTRA_ARTERIAL

## 2019-09-28 MED ORDER — SODIUM CHLORIDE 0.9% FLUSH: 3.0000 mL | Freq: Two times a day (BID) | INTRAVENOUS | Status: DC

## 2019-09-28 MED ORDER — MIDAZOLAM HCL 2 MG/2ML IJ SOLN
INTRAMUSCULAR | Status: AC
  Filled 2019-09-28: qty 2

## 2019-09-28 MED ORDER — HEPARIN (PORCINE) IN NACL 1000-0.9 UT/500ML-% IV SOLN
INTRAVENOUS | Status: DC | PRN
  Administered 2019-09-28 (×2): 500 mL

## 2019-09-28 MED ORDER — FENTANYL CITRATE (PF) 100 MCG/2ML IJ SOLN
INTRAMUSCULAR | Status: DC | PRN
  Administered 2019-09-28: 25 ug via INTRAVENOUS

## 2019-09-28 MED ORDER — FENTANYL CITRATE (PF) 100 MCG/2ML IJ SOLN
INTRAMUSCULAR | Status: AC
  Filled 2019-09-28: qty 2

## 2019-09-28 MED ORDER — MIDAZOLAM HCL 2 MG/2ML IJ SOLN
INTRAMUSCULAR | Status: DC | PRN
  Administered 2019-09-28: 1 mg via INTRAVENOUS

## 2019-09-28 SURGICAL SUPPLY — 10 items
CATH 5FR JL3.5 JR4 ANG PIG MP (CATHETERS) ×1 IMPLANT
CATH BALLN WEDGE 5F 110CM (CATHETERS) ×1 IMPLANT
CATH VISTA GUIDE 6FR XB4 SH (CATHETERS) IMPLANT
GLIDESHEATH SLEND SS 6F .021 (SHEATH) ×1 IMPLANT
GUIDEWIRE INQWIRE 1.5J.035X260 (WIRE) IMPLANT
INQWIRE 1.5J .035X260CM (WIRE) ×2
KIT HEART LEFT (KITS) ×2 IMPLANT
PACK CARDIAC CATHETERIZATION (CUSTOM PROCEDURE TRAY) ×2 IMPLANT
SHEATH GLIDE SLENDER 4/5FR (SHEATH) ×1 IMPLANT
TRANSDUCER W/STOPCOCK (MISCELLANEOUS) ×2 IMPLANT

## 2019-09-28 NOTE — Telephone Encounter (Signed)
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

## 2019-09-28 NOTE — Interval H&P Note (Signed)
History and Physical Interval Note:  09/28/2019 7:38 AM  Margaret Norman  has presented today for surgery, with the diagnosis of heart failure.  The various methods of treatment have been discussed with the patient and family. After consideration of risks, benefits and other options for treatment, the patient has consented to  Procedure(s): RIGHT/LEFT HEART CATH AND CORONARY ANGIOGRAPHY (N/A) as a surgical intervention.  The patient's history has been reviewed, patient examined, no change in status, stable for surgery.  I have reviewed the patient's chart and labs.  Questions were answered to the patient's satisfaction.     Gumaro Brightbill [employer]

## 2019-09-28 NOTE — Discharge Instructions (Signed)
Radial Site Care  This sheet gives you information about how to care for yourself after your procedure. Your health care provider may also give you more specific instructions. If you have problems or questions, contact your health care provider. What can I expect after the procedure? After the procedure, it is common to have:  Bruising and tenderness at the catheter insertion area. Follow these instructions at home: Medicines  Take over-the-counter and prescription medicines only as told by your health care provider. Insertion site care  Follow instructions from your health care provider about how to take care of your insertion site. Make sure you: ? Wash your hands with soap and water before you change your bandage (dressing). If soap and water are not available, use hand sanitizer. ? Change your dressing as told by your health care provider. ? Leave stitches (sutures), skin glue, or adhesive strips in place. These skin closures may need to stay in place for 2 weeks or longer. If adhesive strip edges start to loosen and curl up, you may trim the loose edges. Do not remove adhesive strips completely unless your health care provider tells you to do that.  Check your insertion site every day for signs of infection. Check for: ? Redness, swelling, or pain. ? Fluid or blood. ? Pus or a bad smell. ? Warmth.  Do not take baths, swim, or use a hot tub until your health care provider approves.  You may shower 24-48 hours after the procedure, or as directed by your health care provider. ? Remove the dressing and gently wash the site with plain soap and water. ? Pat the area dry with a clean towel. ? Do not rub the site. That could cause bleeding.  Do not apply powder or lotion to the site. Activity   For 24 hours after the procedure, or as directed by your health care provider: ? Do not flex or bend the affected arm. ? Do not push or pull heavy objects with the affected arm. ? Do not  drive yourself home from the hospital or clinic. You may drive 24 hours after the procedure unless your health care provider tells you not to. ? Do not operate machinery or power tools.  Do not lift anything that is heavier than 10 lb (4.5 kg), or the limit that you are told, until your health care provider says that it is safe.  Ask your health care provider when it is okay to: ? Return to work or school. ? Resume usual physical activities or sports. ? Resume sexual activity. General instructions  If the catheter site starts to bleed, raise your arm and put firm pressure on the site. If the bleeding does not stop, get help right away. This is a medical emergency.  If you went home on the same day as your procedure, a responsible adult should be with you for the first 24 hours after you arrive home.  Keep all follow-up visits as told by your health care provider. This is important. Contact a health care provider if:  You have a fever.  You have redness, swelling, or yellow drainage around your insertion site. Get help right away if:  You have unusual pain at the radial site.  The catheter insertion area swells very fast.  The insertion area is bleeding, and the bleeding does not stop when you hold steady pressure on the area.  Your arm or hand becomes pale, cool, tingly, or numb. These symptoms may represent a serious problem   that is an emergency. Do not wait to see if the symptoms will go away. Get medical help right away. Call your local emergency services (911 in the [place] Do not drive yourself to the hospital. Summary  After the procedure, it is common to have bruising and tenderness at the site.  Follow instructions from your health care provider about how to take care of your radial site wound. Check the wound every day for signs of infection.  Do not lift anything that is heavier than 10 lb (4.5 kg), or the limit that you are told, until your health care provider says  that it is safe. This information is not intended to replace advice given to you by your health care provider. Make sure you discuss any questions you have with your health care provider. Document Revised: 07/17/2017 Document Reviewed: 07/17/2017 Elsevier Patient Education  2020 Elsevier Inc.  

## 2019-09-29 ENCOUNTER — Ambulatory Visit: Payer: Medicare Other

## 2019-10-01 ENCOUNTER — Encounter: Payer: Self-pay | Admitting: *Deleted

## 2019-10-03 ENCOUNTER — Ambulatory Visit: Payer: Medicare Other | Attending: Internal Medicine

## 2019-10-03 DIAGNOSIS — Z23 Encounter for immunization: Secondary | ICD-10-CM

## 2019-10-03 NOTE — Progress Notes (Signed)
   Covid-19 Vaccination Clinic  Name:  Margaret Norman    MRN: [id number] DOB: May 15, 1951  10/03/2019  [name] was observed post Covid-19 immunization for 15 minutes without incident. She was provided with Vaccine Information Sheet and instruction to access the V-Safe system.   [name] was instructed to call 911 with any severe reactions post vaccine: Marland Kitchen Difficulty breathing  . Swelling of face and throat  . A fast heartbeat  . A bad rash all over body  . Dizziness and weakness   Immunizations Administered    Name Date Dose VIS Date Route   Pfizer COVID-19 Vaccine 10/03/2019 12:08 PM 0.3 mL 06/05/2019 Intramuscular   Manufacturer: [place]   Lot: [id number]   [place]: [id number]

## 2019-10-14 ENCOUNTER — Other Ambulatory Visit: Payer: Self-pay

## 2019-10-14 ENCOUNTER — Ambulatory Visit (HOSPITAL_COMMUNITY)
Admission: RE | Admit: 2019-10-14 | Discharge: 2019-10-14 | Disposition: A | Discharge status: Home / Self Care | Payer: Medicare Other | Source: Ambulatory Visit | Attending: Cardiology | Admitting: Cardiology

## 2019-10-14 VITALS — BP 92/60 | HR 98 | Wt 139.0 lb

## 2019-10-14 DIAGNOSIS — E119 Type 2 diabetes mellitus without complications: Secondary | ICD-10-CM | POA: Insufficient documentation

## 2019-10-14 DIAGNOSIS — Z853 Personal history of malignant neoplasm of breast: Secondary | ICD-10-CM | POA: Insufficient documentation

## 2019-10-14 DIAGNOSIS — Z794 Long term (current) use of insulin: Secondary | ICD-10-CM | POA: Diagnosis not present

## 2019-10-14 DIAGNOSIS — I110 Hypertensive heart disease with heart failure: Secondary | ICD-10-CM | POA: Diagnosis not present

## 2019-10-14 DIAGNOSIS — I447 Left bundle-branch block, unspecified: Secondary | ICD-10-CM | POA: Insufficient documentation

## 2019-10-14 DIAGNOSIS — I5022 Chronic systolic (congestive) heart failure: Secondary | ICD-10-CM | POA: Diagnosis not present

## 2019-10-14 DIAGNOSIS — Z79899 Other long term (current) drug therapy: Secondary | ICD-10-CM | POA: Diagnosis not present

## 2019-10-14 DIAGNOSIS — Z8249 Family history of ischemic heart disease and other diseases of the circulatory system: Secondary | ICD-10-CM | POA: Insufficient documentation

## 2019-10-14 DIAGNOSIS — Z7982 Long term (current) use of aspirin: Secondary | ICD-10-CM | POA: Insufficient documentation

## 2019-10-14 DIAGNOSIS — F329 Major depressive disorder, single episode, unspecified: Secondary | ICD-10-CM | POA: Insufficient documentation

## 2019-10-14 DIAGNOSIS — I428 Other cardiomyopathies: Secondary | ICD-10-CM | POA: Diagnosis not present

## 2019-10-14 DIAGNOSIS — E785 Hyperlipidemia, unspecified: Secondary | ICD-10-CM | POA: Diagnosis not present

## 2019-10-14 LAB — BASIC METABOLIC PANEL
Anion gap: 11 (ref 5–15)
BUN: 17 mg/dL (ref 8–23)
CO2: 22 mmol/L (ref 22–32)
Calcium: 9.3 mg/dL (ref 8.9–10.3)
Chloride: 110 mmol/L (ref 98–111)
Creatinine, Ser: 0.83 mg/dL (ref 0.44–1.00)
GFR calc Af Amer: >60 mL/min (ref >60)
GFR calc non Af Amer: >60 mL/min (ref >60)
Glucose, Bld: 197 mg/dL — ABNORMAL HIGH (ref 70–99)
Potassium: 4 mmol/L (ref 3.5–5.1)
Sodium: 143 mmol/L (ref 135–145)

## 2019-10-14 NOTE — Progress Notes (Signed)
  Patient Name: Margaret Norman MRN: [id number] DOB: 04/18/1951 Referring Physician: Jodi Mourning Date of Service: 09/16/2019 [facility], [place]                                                        End Of Treatment Note  Diagnoses: C50.411-Malignant neoplasm of upper-outer quadrant of right female breast  Cancer Staging: StageIA (pT1c, pN0, cM0) RightBreast UOQ,Invasive DuctalCarcinoma with DCIS, ER+/ PR+/ Her2+, Grade2  Intent: Curative  Radiation Treatment Dates: 08/19/2019 through 09/16/2019 Site Technique Total Dose (Gy) Dose per Fx (Gy) Completed Fx Beam Energies  Breast, Right: Breast_Rt_Bst 3D 12/12 2 6/6 6X, 10X  Breast, Right: Breast_Rt 3D 40.05/40.05 2.67 15/15 6X, 10X   Narrative: The patient tolerated radiation therapy relatively well. On 08/25/2019, she did report some mild pain in the right breast and right lower anterior rib cage that was associated with a fall around that time. On examination, the right breast area did not show any appreciable bruising or swelling, nor was there any bruising to the rib cage area. As treatment continued, there were some hyperpigmentation changes with erythema noted to the right breast. No skin breakdown.  Plan: The patient will follow-up with radiation oncology in one month.  ________________________________________________   Blair Promise, PhD, MD  This document serves as a record of services personally performed by Gery Pray, MD. It was created on his behalf by Clerance Lav, a trained medical scribe. The creation of this record is based on the scribe's personal observations and the provider's statements to them. This document has been checked and approved by the attending provider.

## 2019-10-14 NOTE — Patient Instructions (Signed)
Labs done today, your results will be available in MyChart, we will contact you for abnormal readings.  Your physician has requested that you have a cardiac MRI. Cardiac MRI uses a computer to create images of your heart as its beating, producing both still and moving pictures of your heart and major blood vessels. For further information please visit [email or web address] Please follow the instruction sheet given to you today for more information. ONCE THIS IS APPROVED WITH YOUR INSURANCE YOU WILL BE CONTACTED   Your physician recommends that you schedule a follow-up appointment in: 3 months  If you have any questions or concerns before your next appointment please send [place] a message through [place] or call our office at [phone].  At the [facility], you and your health needs are our priority. As part of our continuing mission to provide you with exceptional heart care, we have created designated Provider Care Teams. These Care Teams include your primary Cardiologist (physician) and Advanced Practice Providers (APPs- Physician Assistants and Nurse Practitioners) who all work together to provide you with the care you need, when you need it.   You may see any of the following providers on your designated Care Team at your next follow up: Marland Kitchen Dr Glori Bickers . Dr Loralie Champagne . Darrick Grinder, NP . Lyda Jester, PA . Audry Riles, PharmD   Please be sure to bring in all your medications bottles to every appointment.

## 2019-10-15 ENCOUNTER — Encounter: Payer: Self-pay | Admitting: *Deleted

## 2019-10-15 NOTE — Progress Notes (Signed)
PCP: [name], FNP Oncology: Dr. Lindi Adie HF cardiology: Dr. Aundra Dubin   69 y.o. with history of DM, chronic LBBB, and breast cancer was referred by Dr. Lindi Adie for workup of fall in EF.  She was diagnosed with right breast cancer, ER+/PR+/HER2+ in 9/20.  She had right lumpectomy in 10/20.  Starting in 11/20, she had been getting 12 cycles of Taxol/Herceptin to be followed by Herceptin alone to complete a year. Echo in 10/20 showed EF 55-60% per report (looked more like 50% on my review, likely due to septal-lateral dyssynchrony).  Her repeat echo for Herceptin screening was done in 1/21, showing EF down to 30-35% with septal-lateral dyssynchrony.  Herceptin has been stopped for now.  She is currently undergoing radiation therapy.   Echo in 3/21 showed that EF remains 30-35% with prominent septal-lateral dyssynchrony.  Patient has a strong family history of heart problems.  Father died from "ruptured aorta."  Sister had heart valve repair, one brother has a pacemaker, and another brother has AL amyloidosis.   LHC/RHC was done in 4/21. This showed normal filling pressures, preserved cardiac output, and no significant CAD.   She returns today for followup of cardiomyopathy.  She is short of breath after walking about 200 feet.  Some fatigue but not marked.  Mild lightheadedness with standing at times.  No orthopnea/PND. Weight is up 2 lbs.   Labs (1/21): K 3.8, creatinine 0.75 Labs (3/21): K 4.5, creatinine 0.71 Labs (4/21): K 4.3, creatinine 0.82  ECG (personally reviewed): NSR, LBBB 130 msec  PMH: 1. Depression 2. Type 2 diabetes 3. Chronic LBBB 4. H/o transient complete heart block: in 10/20 when she was having port-a-cath placed.  5. HTN 6. Hyperlipidemia.  7. Breast cancer: She was diagnosed with right breast cancer, ER+/PR+/HER2+ in 9/20.  She had right lumpectomy in 10/20.  Starting in 11/20, she has been getting 12 cycles of Taxol/Herceptin to be followed by Herceptin alone to  complete a year.  8. Chronic systolic CHF: She has had mildly decreased EF in the past, possibly related to LBBB.  Nonischemic cardiomyopathy.  - Echo (4/17): EF 50-55% - Cardiolite (4/17): EF 48% - Echo (10/20): EF 55-60% (reviewed, think more like 50% but technically difficult).  - Echo (1/21): EF 30-35%, mild LVH, septal dyssynchrony.  - Echo (3/21): EF 30-35% with prominent septal-lateral dyssynchrony.  - LHC/RHC (4/21): mean RA 1, PA 13/3, mean PCWP 2, CI 3.74, no significant CAD.   Social History   Socioeconomic History  . Marital status: Married    Spouse name: Reginald Mangels  . Number of children: 2  . Years of education: Not on file  . Highest education level: Not on file  Occupational History  . Occupation: CSR    Employer: [employer]  . Occupation: retired  Tobacco Use  . Smoking status: Never Smoker  . Smokeless tobacco: Never Used  Substance and Sexual Activity  . Alcohol use: No  . Drug use: No  . Sexual activity: Not Currently  Other Topics Concern  . Not on file  Social History Narrative   Regular Exercise -  NO   Social Determinants of Health   Financial Resource Strain:   . Difficulty of Paying Living Expenses:   Food Insecurity: No Food Insecurity  . Worried About Charity fundraiser in the Last Year: Never true  . Ran Out of Food in the Last Year: Never true  Transportation Needs:   . Lack of Transportation (Medical):   Marland Kitchen  Lack of Transportation (Non-Medical):   Physical Activity:   . Days of Exercise per Week:   . Minutes of Exercise per Session:   Stress: No Stress Concern Present  . Feeling of Stress : Not at all  Social Connections: Unknown  . Frequency of Communication with Friends and Family: Not on file  . Frequency of Social Gatherings with Friends and Family: Not on file  . Attends Religious Services: Not on file  . Active Member of Clubs or Organizations: Not on file  . Attends Archivist Meetings: Not on file  .  Marital Status: Married  Human resources officer Violence: Not At Risk  . Fear of Current or Ex-Partner: No  . Emotionally Abused: No  . Physically Abused: No  . Sexually Abused: No   Family History  Problem Relation Age of Onset  . Liver disease Mother   . Dementia Mother   . Diabetes Mother        borderline  . Coronary artery disease Father   . Heart attack Father   . Hypertension Father   . Heart disease Father   . Cancer Other        leukemia  . Stroke Maternal Grandfather   . Hyperlipidemia Brother        Amyloidosis  . Diabetes Maternal Grandmother   . Cancer Paternal Uncle        unknown  . Heart attack Paternal Grandmother   . Heart attack Paternal Uncle   . Hypertension Brother   . Diabetes Daughter        borderline  . Colon cancer Neg Hx    Current Outpatient Medications  Medication Sig Dispense Refill  . acetaminophen (TYLENOL) 500 MG tablet Take 1,000 mg by mouth every 6 (six) hours as needed for moderate pain.    Marland Kitchen anastrozole (ARIMIDEX) 1 MG tablet Take 1 tablet (1 mg total) by mouth daily. 90 tablet 3  . aspirin 81 MG EC tablet Take 81 mg by mouth daily.      Marland Kitchen b complex vitamins tablet Take 1 tablet by mouth daily at 12 noon.    . Blood Glucose Monitoring Suppl (ONETOUCH VERIO FLEX SYSTEM) W/DEVICE KIT 1 kit by Does not apply route 3 (three) times daily. 1 kit 1  . buPROPion (WELLBUTRIN XL) 150 MG 24 hr tablet Take 150 mg by mouth daily.    . carvedilol (COREG) 6.25 MG tablet Take 1 tablet (6.25 mg total) by mouth 2 (two) times daily. 60 tablet 3  . Cholecalciferol (VITAMIN D) 50 MCG (2000 UT) tablet Take 2,000 Units by mouth daily.    . empagliflozin (JARDIANCE) 25 MG TABS tablet Take 25 mg by mouth daily. 90 tablet 3  . fluticasone (FLONASE) 50 MCG/ACT nasal spray Place 2 sprays into both nostrils daily as needed for allergies or rhinitis.    Marland Kitchen glucose blood (ONETOUCH VERIO) test strip Test three times daily as directed. DX: E11.9 300 each 3  . Insulin Pen  Needle (B-D ULTRAFINE III SHORT PEN) 31G X 8 MM MISC USE DAILY WITH LANTUS  INSULIN 90 each 1  . LANTUS SOLOSTAR 100 UNIT/ML Solostar Pen INJECT SUBCUTANEOUSLY 30  UNITS AT BEDTIME (Patient taking differently: Inject 30 Units into the skin at bedtime. ) 30 mL 3  . metFORMIN (GLUCOPHAGE) 1000 MG tablet Take 1,000 mg by mouth 2 (two) times daily with a meal.    . omeprazole (PRILOSEC) 40 MG capsule Take 40 mg by mouth daily.    Marland Kitchen  ONE TOUCH LANCETS MISC Test three times daily as directed. DX: E11.9 300 each 3  . Probiotic Product (PROBIOTIC ADVANCED PO) Take 1 tablet by mouth at bedtime.     . rosuvastatin (CRESTOR) 10 MG tablet Take 10 mg by mouth at bedtime.    . sacubitril-valsartan (ENTRESTO) 24-26 MG Take 1 tablet by mouth 2 (two) times daily. 60 tablet 5  . sertraline (ZOLOFT) 100 MG tablet Take 100 mg by mouth daily.    Marland Kitchen spironolactone (ALDACTONE) 25 MG tablet Take 1 tablet (25 mg total) by mouth daily. 90 tablet 3  . TRULICITY 3 KN/3.9JQ SOPN INJECT 3 MG INTO THE SKIN  WEEKLY (Patient taking differently: Inject 3 mg as directed every Saturday. ) 12 pen 2  . Vitamin E 180 MG CAPS Take 180 mg by mouth at bedtime.      No current facility-administered medications for this encounter.   BP 92/60   Pulse 98   Wt 63 kg (139 lb)   SpO2 97%   BMI 25.42 kg/m  General: NAD Neck: No JVD, no thyromegaly or thyroid nodule.  Lungs: Clear to auscultation bilaterally with normal respiratory effort. CV: Nondisplaced PMI.  Heart regular S1/S2, no S3/S4, no murmur.  No peripheral edema.  No carotid bruit.  Normal pedal pulses.  Abdomen: Soft, nontender, no hepatosplenomegaly, no distention.  Skin: Intact without lesions or rashes.  Neurologic: Alert and oriented x 3.  Psych: Normal affect. Extremities: No clubbing or cyanosis.  HEENT: Normal.   Assessment/Plan: 1. Chronic systolic CHF:  Nonischemic cardiomyopathy.  Echo in 1/21 showed EF down to 30-35% with septal-lateral dyssynchrony.  Reviewing  the prior echo in 10/20, I think that there was a mild pre-existing cardiomyopathy (possibly LBBB cardiomyopathy), EF looked like 50% to me (not normal).  Based on the timeline, the most likely cause for fall in EF seems to be Herceptin use.  She is now off Herceptin, but echo in 3/21 showed EF still in the 30-35% range.  LHC/RHC in 4/21 showed low filling pressures, preserved cardiac output, and no significant CAD.  NYHA class II symptoms, now volume overloaded on exam. She does not have BP room for medication titration today.  - Continue Entresto 24/26 bid.  - Continue Coreg 6.25 mg bid.  - Continue spironolactone 25 mg daily.  - Continue Jardiance.  - I will arrange for cardiac MRI to assess for myocarditis or infiltrative disease like amyloidosis.  - I think that she should continue to stay off Herceptin with EF still moderately depressed.  - If EF does not recover, she will be a CRT-D candidate with LBBB.  2. 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  - Follow closely with use of Coreg.   3. LBBB: Chronic.  ?LBBB cardiomyopathy.  4. Breast cancer: Herceptin on hold for now.  If EF recovers, would be reasonable to retry Herceptin with close monitoring and ongoing use of cardiomyopathy meds. However, EF still remains low.   Followup in 3 months.   Loralie Champagne 10/15/2019

## 2019-10-16 ENCOUNTER — Telehealth: Payer: Self-pay | Admitting: *Deleted

## 2019-10-16 NOTE — Telephone Encounter (Signed)
Called patient to remind her of scheduled appointments for 10/19/19.

## 2019-10-19 ENCOUNTER — Inpatient Hospital Stay: Payer: Medicare Other

## 2019-10-19 ENCOUNTER — Ambulatory Visit
Admission: RE | Admit: 2019-10-19 | Discharge: 2019-10-19 | Disposition: A | Discharge status: Home / Self Care | Payer: Medicare Other | Source: Ambulatory Visit | Attending: Radiation Oncology | Admitting: Radiation Oncology

## 2019-10-19 ENCOUNTER — Encounter: Payer: Self-pay | Admitting: *Deleted

## 2019-10-19 ENCOUNTER — Other Ambulatory Visit: Payer: Self-pay

## 2019-10-19 ENCOUNTER — Inpatient Hospital Stay: Payer: Medicare Other | Attending: Hematology and Oncology | Admitting: *Deleted

## 2019-10-19 VITALS — BP 119/52 | HR 90 | Temp 97.8°F | Resp 20 | Ht 62.0 in | Wt 138.2 lb

## 2019-10-19 DIAGNOSIS — R2681 Unsteadiness on feet: Secondary | ICD-10-CM | POA: Insufficient documentation

## 2019-10-19 DIAGNOSIS — Z79899 Other long term (current) drug therapy: Secondary | ICD-10-CM | POA: Diagnosis not present

## 2019-10-19 DIAGNOSIS — Z79811 Long term (current) use of aromatase inhibitors: Secondary | ICD-10-CM | POA: Insufficient documentation

## 2019-10-19 DIAGNOSIS — Z923 Personal history of irradiation: Secondary | ICD-10-CM | POA: Insufficient documentation

## 2019-10-19 DIAGNOSIS — Z170 Estrogen receptor positive status [ER+]: Secondary | ICD-10-CM

## 2019-10-19 DIAGNOSIS — Z794 Long term (current) use of insulin: Secondary | ICD-10-CM | POA: Insufficient documentation

## 2019-10-19 DIAGNOSIS — Z9181 History of falling: Secondary | ICD-10-CM | POA: Insufficient documentation

## 2019-10-19 DIAGNOSIS — C50411 Malignant neoplasm of upper-outer quadrant of right female breast: Secondary | ICD-10-CM | POA: Diagnosis not present

## 2019-10-19 DIAGNOSIS — R5383 Other fatigue: Secondary | ICD-10-CM | POA: Diagnosis not present

## 2019-10-19 DIAGNOSIS — Z7982 Long term (current) use of aspirin: Secondary | ICD-10-CM | POA: Diagnosis not present

## 2019-10-19 LAB — RESEARCH LABS

## 2019-10-19 NOTE — Progress Notes (Addendum)
One month follow up right breast denies any pain or discomfort c/o moderate fatigue did not resolve after treatment resident does c/o unsteady gait has had 3 falls within the last three months continue with sonafine daily until completed will switch over to Vitamin E cream  Blood pressure (!) 119/52, pulse 90, temperature 97.8 F (36.6 C), resp. rate 20, height 5\' 2"  (1.575 m), weight 138 lb 3.2 oz (62.7 kg), SpO2 99 %.

## 2019-10-19 NOTE — Progress Notes (Signed)
Radiation Oncology         ([phone]) [phone] ________________________________  Name: Margaret Norman MRN: [id number]  Date: 10/19/2019  DOB: Mar 31, 1951  Follow-Up Visit Note  CC: [name], FNP  Nicholas Lose, MD    ICD-10-CM   1. Malignant neoplasm of upper-outer quadrant of right breast in female, estrogen receptor positive ([place])  C50.411    Z17.0     Diagnosis: StageIA (pT1c, pN0, cM0) RightBreast UOQ,Invasive DuctalCarcinoma with DCIS, ER+/ PR+/ Her2+, Grade2  Interval Since Last Radiation: One month and two days.  Radiation Treatment Dates: 08/19/2019 through 09/16/2019 Site Technique Total Dose (Gy) Dose per Fx (Gy) Completed Fx Beam Energies  Breast, Right: Breast_Rt_Bst 3D 12/12 2 6/6 6X, 10X  Breast, Right: Breast_Rt 3D 40.05/40.05 2.67 15/15 6X, 10X    Narrative:  The patient returns today for routine follow-up. Echocardiogram on 09/15/2019 showed an EF of 30-35%. The patient then underwent a right/left heart catheterization and coronary angiography on 09/28/2019 for cardiomyopathy of uncertain etiology. This showed normal filling pressures, preserved cardiac output, and no significant CAD.  Of note, she received her COVID-19 vaccination on 10/03/2019.  On review of systems, she reports moderate fatigue and unsteady gait that has resulted in three falls in the last three months. She denies any pain or discomfort.  Patient has started on Arimidex.  Herceptin has been discontinued in light of her cardiac issues.  ALLERGIES:  is allergic to invokana [canagliflozin] and amoxicillin.  Meds: Current Outpatient Medications  Medication Sig Dispense Refill  . acetaminophen (TYLENOL) 500 MG tablet Take 1,000 mg by mouth every 6 (six) hours as needed for moderate pain.    Marland Kitchen anastrozole (ARIMIDEX) 1 MG tablet Take 1 tablet (1 mg total) by mouth daily. 90 tablet 3  . aspirin 81 MG EC tablet Take 81 mg by mouth daily.      Marland Kitchen b complex vitamins tablet Take 1 tablet  by mouth daily at 12 noon.    . Blood Glucose Monitoring Suppl (ONETOUCH VERIO FLEX SYSTEM) W/DEVICE KIT 1 kit by Does not apply route 3 (three) times daily. 1 kit 1  . buPROPion (WELLBUTRIN XL) 150 MG 24 hr tablet Take 150 mg by mouth daily.    . carvedilol (COREG) 6.25 MG tablet Take 1 tablet (6.25 mg total) by mouth 2 (two) times daily. 60 tablet 3  . Cholecalciferol (VITAMIN D) 50 MCG (2000 UT) tablet Take 2,000 Units by mouth daily.    . empagliflozin (JARDIANCE) 25 MG TABS tablet Take 25 mg by mouth daily. 90 tablet 3  . fluticasone (FLONASE) 50 MCG/ACT nasal spray Place 2 sprays into both nostrils daily as needed for allergies or rhinitis.    Marland Kitchen glucose blood (ONETOUCH VERIO) test strip Test three times daily as directed. DX: E11.9 300 each 3  . Insulin Pen Needle (B-D ULTRAFINE III SHORT PEN) 31G X 8 MM MISC USE DAILY WITH LANTUS  INSULIN 90 each 1  . LANTUS SOLOSTAR 100 UNIT/ML Solostar Pen INJECT SUBCUTANEOUSLY 30  UNITS AT BEDTIME (Patient taking differently: Inject 30 Units into the skin at bedtime. ) 30 mL 3  . metFORMIN (GLUCOPHAGE) 1000 MG tablet Take 1,000 mg by mouth 2 (two) times daily with a meal.    . omeprazole (PRILOSEC) 40 MG capsule Take 40 mg by mouth daily.    . ONE TOUCH LANCETS MISC Test three times daily as directed. DX: E11.9 300 each 3  . Probiotic Product (PROBIOTIC ADVANCED PO) Take 1 tablet by mouth  at bedtime.     . rosuvastatin (CRESTOR) 10 MG tablet Take 10 mg by mouth at bedtime.    . sacubitril-valsartan (ENTRESTO) 24-26 MG Take 1 tablet by mouth 2 (two) times daily. 60 tablet 5  . sertraline (ZOLOFT) 100 MG tablet Take 100 mg by mouth daily.    Marland Kitchen spironolactone (ALDACTONE) 25 MG tablet Take 1 tablet (25 mg total) by mouth daily. 90 tablet 3  . TRULICITY 3 YY/5.1TM SOPN INJECT 3 MG INTO THE SKIN  WEEKLY (Patient taking differently: Inject 3 mg as directed every Saturday. ) 12 pen 2  . Vitamin E 180 MG CAPS Take 180 mg by mouth at bedtime.      No current  facility-administered medications for this encounter.    Physical Findings: The patient is in no acute distress. Patient is alert and oriented.  height is 5' 2"  (1.575 m) and weight is 138 lb 3.2 oz (62.7 kg). Her temperature is 97.8 F (36.6 C). Her blood pressure is 119/52 (abnormal) and her pulse is 90. Her respiration is 20 and oxygen saturation is 99%.   No significant changes. Lungs are clear to auscultation bilaterally. Heart has regular rate and rhythm. No palpable cervical, supraclavicular, or axillary adenopathy. Abdomen soft, non-tender, normal bowel sounds. Left breast: No palpable mass, nipple discharge, or bleeding.  Right breast: Patient skin is healed well.  Mild hyperpigmentation changes.  No dominant mass appreciated breast nipple discharge or bleeding  Lab Findings: Lab Results  Component Value Date   WBC 14.1 (H) 09/25/2019   HGB 11.6 (L) 09/28/2019   HCT 34.0 (L) 09/28/2019   MCV 94.3 09/25/2019   PLT 427 (H) 09/25/2019    Radiographic Findings: CARDIAC CATHETERIZATION  Result Date: 09/28/2019 1. Low filling pressures 2. Normal cardiac output 3. No significant coronary disease, nonischemic cardiomyopathy.    Impression: StageIA (pT1c, pN0, cM0) RightBreast UOQ,Invasive DuctalCarcinoma with DCIS, ER+/ PR+/ Her2+, Grade2  The patient tolerated her adjuvant radiation therapy well.  Plan: As needed follow-up in radiation oncology.  Patient will continue close follow-up with medical oncology as above  ____________________________________   Blair Promise, PhD, MD  This document serves as a record of services personally performed by Gery Pray, MD. It was created on his behalf by Clerance Lav, a trained medical scribe. The creation of this record is based on the scribe's personal observations and the provider's statements to them. This document has been checked and approved by the attending provider.

## 2019-10-19 NOTE — Research (Signed)
10/19/19 at 4:08pm - S1714 week 24 study notes-The pt was into the cancer center this afternoon for her week 24 assessments and for her routine follow up with Dr. Sondra Come. The pt was given her week24questionnaires to complete upon arrival to the cancer center.The pt's questionnaires were reviewed for completeness and accuracyby the research nurse, Hendricks Limes.  The pt's research blood samples were obtained.  The research nurse met with the pt along with Hendricks Limes, research nurse, to conduct the pt'sweek24neuropathy testing (neuropen and tuning fork assessments).  Dr. Lindi Adie completed the pt'sweek24neuropathy forms for the study.Dr. 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  She said that she attributes it to "tripping over things and old age".   She completed the "Timed Get up and Go" test in 17.6 seconds.  The pt reported that she is slower getting up now because of her left hip pain.  The pt reports she is seeking treatment tomorrow for the hip pain.  The pt denied currently taking the following medications: gabapentin, narcotics, tramadol,duloxetine, and ibuprofen.  The pt reported using the same antidepressants (Wellbutrin and Zoloft) since her baseline visit, and acetaminophen 650 mg (2-3 pills per week) for hip pain, not neuropathy symptoms.  The pt denied any current interventions for neuropathy.The pt stated that there had been no changes in her vitamin/supplements since her baseline visit.  The pt denies using any topical medicines.The pt was thanked for her continued compliance and support of this study. The pt's week52assessments will be done in November 2021.  Brion Aliment RN, BSN, CCRP Clinical Research Nurse 10/19/2019 4:33 PM   10/22/19 at 11:16am- The research nurse called the pt to confirm her vitamin B doses.  The pt said that she  used to take Vitamin B6 as a separate vitamin, but she recently switched to a B complex vitamin.  The pt read her doses from her bottle to the research nurse.  The pt stated that her B complex vitamin contains 2 mg of B6 and 15 mcg of B12.  The pt stated that she does not take glutamine or fish oil.  The pt states she uses Sonafine cream daily for her breast skin changes from radiation.  She denies using any topical agents for neuropathy.  The pt was thanked for continued support of this trial. Brion Aliment RN, BSN, CCRP Clinical Research Nurse 10/22/2019 11:22 AM

## 2019-10-20 ENCOUNTER — Encounter: Payer: Self-pay | Admitting: *Deleted

## 2019-10-20 DIAGNOSIS — M1612 Unilateral primary osteoarthritis, left hip: Secondary | ICD-10-CM | POA: Diagnosis not present

## 2019-11-10 DIAGNOSIS — C50911 Malignant neoplasm of unspecified site of right female breast: Secondary | ICD-10-CM | POA: Diagnosis not present

## 2019-11-12 ENCOUNTER — Encounter (HOSPITAL_COMMUNITY): Payer: Self-pay

## 2019-11-17 ENCOUNTER — Telehealth (HOSPITAL_COMMUNITY): Payer: Self-pay | Admitting: *Deleted

## 2019-11-17 NOTE — Telephone Encounter (Signed)
Attempted to call patient regarding upcoming cardiac MRI appointment. Left message on voicemail with name and callback number  Brigitte Soderberg Tai RN Navigator Cardiac Imaging [facility] [phone] Office [phone] Cell  

## 2019-11-18 ENCOUNTER — Other Ambulatory Visit: Payer: Self-pay

## 2019-11-18 ENCOUNTER — Ambulatory Visit (HOSPITAL_COMMUNITY)
Admission: RE | Admit: 2019-11-18 | Discharge: 2019-11-18 | Disposition: A | Discharge status: Home / Self Care | Payer: Medicare Other | Source: Ambulatory Visit | Attending: Cardiology | Admitting: Cardiology

## 2019-11-18 DIAGNOSIS — I5022 Chronic systolic (congestive) heart failure: Secondary | ICD-10-CM | POA: Diagnosis not present

## 2019-11-18 MED ORDER — GADOBUTROL 1 MMOL/ML IV SOLN
7.0000 mL | Freq: Once | INTRAVENOUS | Status: AC | PRN
  Administered 2019-11-18: 7 mL via INTRAVENOUS

## 2019-12-06 ENCOUNTER — Other Ambulatory Visit: Payer: Self-pay | Admitting: Endocrinology

## 2019-12-06 DIAGNOSIS — E118 Type 2 diabetes mellitus with unspecified complications: Secondary | ICD-10-CM

## 2019-12-11 ENCOUNTER — Encounter (HOSPITAL_COMMUNITY): Payer: Self-pay

## 2019-12-11 ENCOUNTER — Encounter: Payer: Self-pay | Admitting: Hematology and Oncology

## 2019-12-11 ENCOUNTER — Encounter: Payer: Self-pay | Admitting: Family

## 2019-12-22 ENCOUNTER — Encounter: Payer: Self-pay | Admitting: Hematology and Oncology

## 2020-01-14 ENCOUNTER — Encounter: Payer: Self-pay | Admitting: Internal Medicine

## 2020-01-14 ENCOUNTER — Ambulatory Visit: Payer: Medicare Other | Admitting: Internal Medicine

## 2020-01-14 ENCOUNTER — Other Ambulatory Visit: Payer: Self-pay

## 2020-01-14 VITALS — BP 138/70 | HR 81 | Ht 62.0 in | Wt 139.0 lb

## 2020-01-14 DIAGNOSIS — E010 Iodine-deficiency related diffuse (endemic) goiter: Secondary | ICD-10-CM

## 2020-01-14 DIAGNOSIS — E118 Type 2 diabetes mellitus with unspecified complications: Secondary | ICD-10-CM | POA: Diagnosis not present

## 2020-01-14 DIAGNOSIS — Z794 Long term (current) use of insulin: Secondary | ICD-10-CM

## 2020-01-14 DIAGNOSIS — E1165 Type 2 diabetes mellitus with hyperglycemia: Secondary | ICD-10-CM

## 2020-01-14 DIAGNOSIS — E785 Hyperlipidemia, unspecified: Secondary | ICD-10-CM

## 2020-01-14 LAB — POCT GLYCOSYLATED HEMOGLOBIN (HGB A1C): Hemoglobin A1C: 7.4 % — AB (ref 4.0–5.6)

## 2020-01-14 MED ORDER — LANTUS SOLOSTAR 100 UNIT/ML ~~LOC~~ SOPN: PEN_INJECTOR | SUBCUTANEOUS | 3 refills | Status: DC

## 2020-01-14 MED ORDER — INSULIN PEN NEEDLE 32G X 4 MM MISC
3 refills | Status: DC
Start: 2020-01-14 — End: 2020-01-19

## 2020-01-14 NOTE — Progress Notes (Signed)
Patient ID: Margaret Norman, female   DOB: 07/14/1950, 69 y.o.   MRN: [id number]   This visit occurred during the SARS-CoV-2 public health emergency.  Safety protocols were in place, including screening questions prior to the visit, additional usage of staff PPE, and extensive cleaning of exam room while observing appropriate contact time as indicated for disinfecting solutions.   HPI: Margaret Norman is a 69 y.o.-year-old female, returning for follow-up for DM2, dx in 2008, insulin-dependent since 2016, uncontrolled, without long term complications.  Last visit almost 4 months ago.  She is eating out more >> sugars are higher recently.  Reviewed HbA1c levels: Lab Results  Component Value Date   HGBA1C 6.6 (A) 09/17/2019   HGBA1C 8.6 (H) 04/10/2019   HGBA1C 7.4 (A) 04/01/2019  03/19/2017: HbA1c calculated from fructosamine is 6.7%! 12/31/2016: HbA1c calculated from fructosamine: 6.9%!  Pt is on: - Metformin 1000 mg 2x a day with meals - Jardiance 25 mg daily before breakfast - Trulicity 1.5 >> 3 mg weekly - Lantus 30 units at night Stopped Actos when started trulicity back. She was on Trulicity but became expensive >> changed to Actos. She was on Invokana >> recurrent UTIs.  Pt checks her sugars 1-3 times a day:  - am: 87-146, 192, 207 >> 93-167, 183 (snack at bedtime) >> 95-192, 228 - 2h after b'fast:  147-182 >> 113-163, 208 >> 161-190 >> 189-210 - before lunch: 186 >> 128-174 >> 199 >> n/c >> 135-215 - 2h after lunch: n/c >> 186- 194 >> 161 >> 176 >> 96 - before dinner: n/c >> 163, 168 >> 240 (?) >> 119, 167 - 2h after dinner: n/c >> 292 >> n/c - bedtime:  234 >> n/c >> 118, 125 >> 148-167 >> 172, 200 - nighttime: n/c >> 172 >> 178 >> n/c Lowest sugar was 87 >> 93 >> 95; she has hypoglycemia awareness in the 70s. Highest sugar was 292 >> 240 x1, 190 >> 228.  Glucometer: OneTouch Verio  -No CKD, last BUN/creatinine:  Lab Results  Component Value Date   BUN 17 10/14/2019    BUN 12 09/25/2019   CREATININE 0.83 10/14/2019   CREATININE 0.82 09/25/2019  On Entresto. -+ HL; last set of lipids: Lab Results  Component Value Date   CHOL 101 02/02/2019   HDL 47.40 02/02/2019   LDLCALC 38 02/02/2019   LDLDIRECT 137.9 02/04/2013   TRIG 78.0 02/02/2019   CHOLHDL 2 02/02/2019  On Crestor 10. - last eye exam was in 06/2019: No DR, + cataract OS.  Dr. Marica Otter. -No numbness and tingling in her feet.  She has restless leg syndrome.  Pt has FH of DM in [place].  Lab Results  Component Value Date   TSH 1.167 04/15/2019   She continues to have a lot of stress at home with her grandson who 41, is bipolar, and was laid off from work - who is living with her and her husband along with patient's daughter.   She sees Cardiology - Dr. Aundra Dubin >> started [place], [facility]g. She also has a history of breast cancer, now s/p chemoradiation therapy.  ROS: Constitutional: no weight gain/no weight loss, no fatigue, no subjective hyperthermia, no subjective hypothermia Eyes: no blurry vision, no xerophthalmia ENT: no sore throat, no nodules palpated in neck, no dysphagia, no odynophagia, no hoarseness Cardiovascular: no CP/no SOB/no palpitations/no leg swelling Respiratory: no cough/no SOB/no wheezing Gastrointestinal: no N/no V/no D/no C/no acid reflux Musculoskeletal: no muscle aches/no joint aches Skin: no  rashes, no hair loss Neurological: no tremors/no numbness/no tingling/no dizziness  I reviewed pt's medications, allergies, PMH, social hx, family hx, and changes were documented in the history of present illness. Otherwise, unchanged from my initial visit note.  Past Medical History:  Diagnosis Date  . Arthritis   . Chest pain    Nuclear, adenosine,  December, 2013, low risk nuclear scan with small, moderate in intensity, fixed anteroseptal defect. This is possibly related to an LBBB versus small prior infarct. No ischemia  . Depression   . Gallstones 11-06  . GERD  (gastroesophageal reflux disease)   . History of kidney stones   . HTN (hypertension)   . Hyperlipidemia   . Kidney mass 01/28/2014  . LBBB (left bundle branch block)   . Nephrolithiasis   . Pneumonia   . 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 Type II or unspecified type diabetes mellitus without mention of complication, not stated as uncontrolled    Past Surgical History:  Procedure Laterality Date  . APPENDECTOMY    . BREAST LUMPECTOMY WITH RADIOACTIVE SEED AND SENTINEL LYMPH NODE BIOPSY Right 04/15/2019   Procedure: RIGHT BREAST PARTIAL MASTECTOMY WITH RADIOACTIVE SEED AND SENTINEL LYMPH NODE BIOPSY;  Surgeon: Coralie Keens, MD;  Location: [facility];  Service: General;  Laterality: Right;  . CESAREAN SECTION     x2 ? w/appy  . CHOLECYSTECTOMY    . ESOPHAGOGASTRODUODENOSCOPY    . HERNIA REPAIR    . KNEE ARTHROSCOPY Right 11/2005  . LIVER BIOPSY    . PANCREATIC CYST EXCISION    . PORTACATH PLACEMENT Left 04/15/2019   Procedure: INSERTION PORT-A-CATH WITH ULTRASOUND;  Surgeon: Coralie Keens, MD;  Location: [facility];  Service: General;  Laterality: Left;  . RIGHT/LEFT HEART CATH AND CORONARY ANGIOGRAPHY N/A 09/28/2019   Procedure: RIGHT/LEFT HEART CATH AND CORONARY ANGIOGRAPHY;  Surgeon: Larey Dresser, MD;  Location: [facility];  Service: Cardiovascular;  Laterality: N/A;  . SPLENECTOMY    . TONSILLECTOMY AND ADENOIDECTOMY     Social History   Socioeconomic History  . Marital status: Married    Spouse name: Auriella Wieand  . Number of children: 2  . Years of education: Not on file  . Highest education level: Not on file  Occupational History  . Occupation: CSR    Employer: [employer]  . Occupation: retired  Tobacco Use  . Smoking status: Never Smoker  . Smokeless tobacco: Never Used  Vaping Use  . Vaping Use: Never used  Substance and Sexual Activity  . Alcohol use: No  . Drug use: No  . Sexual activity: Not Currently  Other Topics Concern   . Not on file  Social History Narrative   Regular Exercise -  NO   Social Determinants of Health   Financial Resource Strain:   . Difficulty of Paying Living Expenses:   Food Insecurity: No Food Insecurity  . Worried About Charity fundraiser in the Last Year: Never true  . Ran Out of Food in the Last Year: Never true  Transportation Needs:   . Lack of Transportation (Medical):   Marland Kitchen Lack of Transportation (Non-Medical):   Physical Activity:   . Days of Exercise per Week:   . Minutes of Exercise per Session:   Stress: No Stress Concern Present  . Feeling of Stress : Not at all  Social Connections: Unknown  . Frequency of Communication with Friends and Family: Not on file  . Frequency of Social Gatherings with Friends  and Family: Not on file  . Attends Religious Services: Not on file  . Active Member of Clubs or Organizations: Not on file  . Attends Archivist Meetings: Not on file  . Marital Status: Married  Human resources officer Violence: Not At Risk  . Fear of Current or Ex-Partner: No  . Emotionally Abused: No  . Physically Abused: No  . Sexually Abused: No   Current Outpatient Medications on File Prior to Visit  Medication Sig Dispense Refill  . acetaminophen (TYLENOL) 500 MG tablet Take 1,000 mg by mouth every 6 (six) hours as needed for moderate pain.    Marland Kitchen anastrozole (ARIMIDEX) 1 MG tablet Take 1 tablet (1 mg total) by mouth daily. 90 tablet 3  . aspirin 81 MG EC tablet Take 81 mg by mouth daily.      Marland Kitchen b complex vitamins tablet Take 1 tablet by mouth daily at 12 noon.    . B-D ULTRAFINE III SHORT PEN 31G X 8 MM MISC USE DAILY WITH LANTUS  INSULIN 90 each 1  . Blood Glucose Monitoring Suppl (ONETOUCH VERIO FLEX SYSTEM) W/DEVICE KIT 1 kit by Does not apply route 3 (three) times daily. 1 kit 1  . buPROPion (WELLBUTRIN XL) 150 MG 24 hr tablet Take 150 mg by mouth daily.    . carvedilol (COREG) 6.25 MG tablet Take 1 tablet (6.25 mg total) by mouth 2 (two) times daily.  60 tablet 3  . Cholecalciferol (VITAMIN D) 50 MCG (2000 UT) tablet Take 2,000 Units by mouth daily.    . empagliflozin (JARDIANCE) 25 MG TABS tablet Take 25 mg by mouth daily. 90 tablet 3  . fluticasone (FLONASE) 50 MCG/ACT nasal spray Place 2 sprays into both nostrils daily as needed for allergies or rhinitis.    Marland Kitchen glucose blood (ONETOUCH VERIO) test strip Test three times daily as directed. DX: E11.9 300 each 3  . LANTUS SOLOSTAR 100 UNIT/ML Solostar Pen INJECT SUBCUTANEOUSLY 30  UNITS AT BEDTIME (Patient taking differently: Inject 30 Units into the skin at bedtime. ) 30 mL 3  . metFORMIN (GLUCOPHAGE) 1000 MG tablet Take 1,000 mg by mouth 2 (two) times daily with a meal.    . omeprazole (PRILOSEC) 40 MG capsule Take 40 mg by mouth daily.    . ONE TOUCH LANCETS MISC Test three times daily as directed. DX: E11.9 300 each 3  . Probiotic Product (PROBIOTIC ADVANCED PO) Take 1 tablet by mouth at bedtime.     . rosuvastatin (CRESTOR) 10 MG tablet Take 10 mg by mouth at bedtime.    . sacubitril-valsartan (ENTRESTO) 24-26 MG Take 1 tablet by mouth 2 (two) times daily. 60 tablet 5  . sertraline (ZOLOFT) 100 MG tablet Take 100 mg by mouth daily.    Marland Kitchen spironolactone (ALDACTONE) 25 MG tablet Take 1 tablet (25 mg total) by mouth daily. 90 tablet 3  . TRULICITY 3 EP/3.2RJ SOPN INJECT 3 MG INTO THE SKIN  WEEKLY (Patient taking differently: Inject 3 mg as directed every Saturday. ) 12 pen 2  . Vitamin E 180 MG CAPS Take 180 mg by mouth at bedtime.      No current facility-administered medications on file prior to visit.   Allergies  Allergen Reactions  . Invokana [Canagliflozin] Other (See Comments)    UTI's  . Amoxicillin Other (See Comments)    Sore tongue Did it involve swelling of the face/tongue/throat, SOB, or low BP? No Did it involve sudden or severe rash/hives, skin peeling, or any  reaction on the inside of your mouth or nose? No Did you need to seek medical attention at a hospital or doctor's  office? No When did it last happen?5-10 years ago If all above answers are "NO", may proceed with cephalosporin use.    Family History  Problem Relation Age of Onset  . Liver disease Mother   . Dementia Mother   . Diabetes Mother        borderline  . Coronary artery disease Father   . Heart attack Father   . Hypertension Father   . Heart disease Father   . Cancer Other        leukemia  . Stroke Maternal Grandfather   . Hyperlipidemia Brother        Amyloidosis  . Diabetes Maternal Grandmother   . Cancer Paternal Uncle        unknown  . Heart attack Paternal Grandmother   . Heart attack Paternal Uncle   . Hypertension Brother   . Diabetes Daughter        borderline  . Colon cancer Neg Hx     PE: BP 138/70   Pulse 81   Ht 5' 2"  (1.575 m)   Wt 139 lb (63 kg)   SpO2 97%   BMI 25.42 kg/m   Wt Readings from Last 3 Encounters:  01/14/20 139 lb (63 kg)  10/19/19 138 lb 3.2 oz (62.7 kg)  10/14/19 139 lb (63 kg)   Constitutional: normal weight, in NAD Eyes: PERRLA, EOMI, no exophthalmos ENT: moist mucous membranes, + L thyromegaly, no cervical lymphadenopathy Cardiovascular: RRR, No MRG Respiratory: CTA B Gastrointestinal: abdomen soft, NT, ND, BS+ Musculoskeletal: no deformities, strength intact in all 4 Skin: moist, warm, no rashes Neurological: no tremor with outstretched hands, DTR normal in all 4  ASSESSMENT: 1. DM2, insulin-dependent, uncontrolled, without long term complications, but with hyperglycemia  2. HL  3. L thyromegaly   PLAN:  1. Patient with longstanding, uncontrolled, type 2 diabetes, on oral antidiabetic regimen, and long-acting insulin, weekly GLP-1 receptor agonist with improved control at last visit.  At that time, HbA1c was 6.6% and sugars were better especially the previous 2 months.  She had some higher blood sugars in the morning usually due to snacking at night so we discussed about trying to stop these.  Otherwise, we did not  change her regimen. -At this visit, she started to eat out more and continues to have snacks and sweets after dinner.  She is aware that she absolutely needs to stop these to be able to control her diabetes.  Also, I advised her to try to reduce eating out as much as she can.  To improve her morning sugars and hopefully to cut down her cravings and appetite at night, I advised her to move the Metformin 2000 mg with dinner.  She is tolerating Metformin well.  Other options, which we can employ at next visit if she is not successful in improving her diet, are to increase Lantus or Trulicity. - I suggested to:  Patient Instructions  Please try to move: - Metformin 2000 mg with dinner  Continue: - Jardiance 25 mg daily before breakfast - Trulicity 3 mg weekly - Lantus 30 units at night  NO EATING AFTER 7 PM!  Please return in 4 months with your sugar log.   - we checked her HbA1c: 7.4% (higher) - advised to check sugars at different times of the day - 1x a day, rotating check times - advised  for yearly eye exams >> she is UTD - return to clinic in 4 months    2. HL -Reviewed latest lipid panel from 01/2019: all Fractions at goal Lab Results  Component Value Date   CHOL 101 02/02/2019   HDL 47.40 02/02/2019   LDLCALC 38 02/02/2019   LDLDIRECT 137.9 02/04/2013   TRIG 78.0 02/02/2019   CHOLHDL 2 02/02/2019  -Continues the statin without side effects  3. L thyromegaly -Felt on palpation today -Upon questioning, patient also feels the left side of her lower neck enlarged -No neck compression symptoms -TSH was normal in 03/2019 -We'll check a thyroid ultrasound and we discussed about the possible need for FNA in case the nodule is found  Orders Placed This Encounter  Procedures  . US THYROID    Philemon Kingdom, MD PhD [facility] Endocrinology

## 2020-01-14 NOTE — Addendum Note (Signed)
Addended by: Cardell Peach I on: 01/14/2020 10:50 AM   Modules accepted: Orders

## 2020-01-14 NOTE — Patient Instructions (Addendum)
Please try to move: - Metformin 2000 mg with dinner  Continue: - Jardiance 25 mg daily before breakfast - Trulicity 3 mg weekly - Lantus 30 units at night  NO EATING AFTER 7 PM!  Please return in 4 months with your sugar log.

## 2020-01-15 ENCOUNTER — Encounter (HOSPITAL_COMMUNITY): Payer: Self-pay | Admitting: Cardiology

## 2020-01-15 ENCOUNTER — Other Ambulatory Visit: Payer: Self-pay

## 2020-01-15 ENCOUNTER — Ambulatory Visit (HOSPITAL_COMMUNITY)
Admission: RE | Admit: 2020-01-15 | Discharge: 2020-01-15 | Disposition: A | Discharge status: Home / Self Care | Payer: Medicare Other | Source: Ambulatory Visit | Attending: Cardiology | Admitting: Cardiology

## 2020-01-15 VITALS — BP 122/86 | HR 88 | Wt 138.0 lb

## 2020-01-15 DIAGNOSIS — Z79899 Other long term (current) drug therapy: Secondary | ICD-10-CM | POA: Insufficient documentation

## 2020-01-15 DIAGNOSIS — Z833 Family history of diabetes mellitus: Secondary | ICD-10-CM | POA: Insufficient documentation

## 2020-01-15 DIAGNOSIS — I110 Hypertensive heart disease with heart failure: Secondary | ICD-10-CM | POA: Insufficient documentation

## 2020-01-15 DIAGNOSIS — I428 Other cardiomyopathies: Secondary | ICD-10-CM | POA: Insufficient documentation

## 2020-01-15 DIAGNOSIS — Z8249 Family history of ischemic heart disease and other diseases of the circulatory system: Secondary | ICD-10-CM | POA: Diagnosis not present

## 2020-01-15 DIAGNOSIS — F329 Major depressive disorder, single episode, unspecified: Secondary | ICD-10-CM | POA: Diagnosis not present

## 2020-01-15 DIAGNOSIS — E785 Hyperlipidemia, unspecified: Secondary | ICD-10-CM | POA: Diagnosis not present

## 2020-01-15 DIAGNOSIS — Z7982 Long term (current) use of aspirin: Secondary | ICD-10-CM | POA: Diagnosis not present

## 2020-01-15 DIAGNOSIS — I447 Left bundle-branch block, unspecified: Secondary | ICD-10-CM | POA: Diagnosis not present

## 2020-01-15 DIAGNOSIS — Z170 Estrogen receptor positive status [ER+]: Secondary | ICD-10-CM | POA: Insufficient documentation

## 2020-01-15 DIAGNOSIS — Z8349 Family history of other endocrine, nutritional and metabolic diseases: Secondary | ICD-10-CM | POA: Diagnosis not present

## 2020-01-15 DIAGNOSIS — C50911 Malignant neoplasm of unspecified site of right female breast: Secondary | ICD-10-CM | POA: Diagnosis not present

## 2020-01-15 DIAGNOSIS — I5022 Chronic systolic (congestive) heart failure: Secondary | ICD-10-CM | POA: Diagnosis not present

## 2020-01-15 DIAGNOSIS — E119 Type 2 diabetes mellitus without complications: Secondary | ICD-10-CM | POA: Diagnosis not present

## 2020-01-15 DIAGNOSIS — Z794 Long term (current) use of insulin: Secondary | ICD-10-CM | POA: Insufficient documentation

## 2020-01-15 LAB — BASIC METABOLIC PANEL
Anion gap: 13 (ref 5–15)
BUN: 13 mg/dL (ref 8–23)
CO2: 24 mmol/L (ref 22–32)
Calcium: 9.7 mg/dL (ref 8.9–10.3)
Chloride: 106 mmol/L (ref 98–111)
Creatinine, Ser: 0.86 mg/dL (ref 0.44–1.00)
GFR calc Af Amer: >60 mL/min (ref >60)
GFR calc non Af Amer: >60 mL/min (ref >60)
Glucose, Bld: 93 mg/dL (ref 70–99)
Potassium: 4.6 mmol/L (ref 3.5–5.1)
Sodium: 143 mmol/L (ref 135–145)

## 2020-01-15 LAB — BRAIN NATRIURETIC PEPTIDE: B Natriuretic Peptide: 50.5 pg/mL (ref 0.0–100.0)

## 2020-01-15 MED ORDER — ENTRESTO 49-51 MG PO TABS
1.0000 | ORAL_TABLET | Freq: Two times a day (BID) | ORAL | 3 refills | Status: DC
Start: 2020-01-15 — End: 2020-05-27

## 2020-01-15 NOTE — Progress Notes (Signed)
ReDS Vest / Clip - 01/15/20 1200      ReDS Vest / Clip   Station Marker A    Ruler Value 25    ReDS Value Range Moderate volume overload    ReDS Actual Value 37    Anatomical Comments Sitting

## 2020-01-15 NOTE — Patient Instructions (Addendum)
Increase Entresto to 49/51 mg Twice daily   Labs done today, your results will be available in MyChart, we will contact you for abnormal readings.  Labs needed in 10 days, we have provided you a prescription to have this done locally  Please follow up with our heart failure pharmacist in 3-4 weeks  Your physician recommends that you schedule a follow-up appointment in: 3 months with echocardiogram  If you have any questions or concerns before your next appointment please send [place] a message through [place] or call our office at [phone].    TO LEAVE A MESSAGE FOR THE NURSE SELECT OPTION 2, PLEASE LEAVE A MESSAGE INCLUDING: . YOUR NAME . DATE OF BIRTH . CALL BACK NUMBER . REASON FOR CALL**this is important as we prioritize the call backs  [place] AS LONG AS YOU CALL BEFORE 4:00 PM  At the [facility], you and your health needs are our priority. As part of our continuing mission to provide you with exceptional heart care, we have created designated Provider Care Teams. These Care Teams include your primary Cardiologist (physician) and Advanced Practice Providers (APPs- Physician Assistants and Nurse Practitioners) who all work together to provide you with the care you need, when you need it.   You may see any of the following providers on your designated Care Team at your next follow up: Marland Kitchen Dr Glori Bickers . Dr Loralie Champagne . Darrick Grinder, NP . Lyda Jester, PA . Audry Riles, PharmD   Please be sure to bring in all your medications bottles to every appointment.

## 2020-01-17 NOTE — Progress Notes (Signed)
PCP: Margaret Salvage, FNP Oncology: Dr. Lindi Norman HF cardiology: Dr. Aundra Norman   69 y.o. with history of DM, chronic LBBB, and breast cancer was referred by Dr. Lindi Norman for workup of fall in EF.  She was diagnosed with right breast cancer, ER+/PR+/HER2+ in 9/20.  She had right lumpectomy in 10/20.  Starting in 11/20, she had been getting 12 cycles of Taxol/Herceptin to be followed by Herceptin alone to complete a year. Echo in 10/20 showed EF 55-60% per report (looked more like 50% on my review, likely due to septal-lateral dyssynchrony).  Her repeat echo for Herceptin screening was done in 1/21, showing EF down to 30-35% with septal-lateral dyssynchrony.  Herceptin has been stopped for now.  She is currently undergoing radiation therapy.   Echo in 3/21 showed that EF remains 30-35% with prominent septal-lateral dyssynchrony.  Patient has a strong family history of heart problems.  Father died from "ruptured aorta."  Sister had heart valve repair, one brother has a pacemaker, and another brother has AL amyloidosis.   LHC/RHC was done in 4/21. This showed normal filling pressures, preserved cardiac output, and no significant CAD. Cardiac MRI in 5/21 showed EF 36% with septal-lateral dyssynchrony, RV EF normal 45%, no LGE.   She returns today for followup of cardiomyopathy.  Weight is down 1 lb.  She is short of breath after walking about 200 feet, maybe a bit worse gradually.  No orthopnea/PND.  No chest pain.  She does still have episodes of lightheadedness with standing, no syncope or falls.  BP is stable today.    REDS clip 37%  Labs (1/21): K 3.8, creatinine 0.75 Labs (3/21): K 4.5, creatinine 0.71 Labs (4/21): K 4.3, creatinine 0.82 => 0.83  PMH: 1. Depression 2. Type 2 diabetes 3. Chronic LBBB 4. H/o transient complete heart block: in 10/20 when she was having port-a-cath placed.  5. HTN 6. Hyperlipidemia.  7. Breast cancer: She was diagnosed with right breast cancer, ER+/PR+/HER2+ in  9/20.  She had right lumpectomy in 10/20.  Starting in 11/20, she has been getting 12 cycles of Taxol/Herceptin to be followed by Herceptin alone to complete a year.  8. Chronic systolic CHF: She has had mildly decreased EF in the past, possibly related to LBBB.  Nonischemic cardiomyopathy.  - Echo (4/17): EF 50-55% - Cardiolite (4/17): EF 48% - Echo (10/20): EF 55-60% (reviewed, think more like 50% but technically difficult).  - Echo (1/21): EF 30-35%, mild LVH, septal dyssynchrony.  - Echo (3/21): EF 30-35% with prominent septal-lateral dyssynchrony.  - LHC/RHC (4/21): mean RA 1, PA 13/3, mean PCWP 2, CI 3.74, no significant CAD.  - Cardiac MRI (5/21): EF 36% with septal-lateral dyssynchrony, RV EF normal 45%, no LGE.   Social History   Socioeconomic History  . Marital status: Married    Spouse name: Margaret Norman  . Number of children: 2  . Years of education: Not on file  . Highest education level: Not on file  Occupational History  . Occupation: CSR    Employer: [employer]  . Occupation: retired  Tobacco Use  . Smoking status: Never Smoker  . Smokeless tobacco: Never Used  Vaping Use  . Vaping Use: Never used  Substance and Sexual Activity  . Alcohol use: No  . Drug use: No  . Sexual activity: Not Currently  Other Topics Concern  . Not on file  Social History Narrative   Regular Exercise -  NO   Social Determinants of Health  Financial Resource Strain:   . Difficulty of Paying Living Expenses:   Food Insecurity: No Food Insecurity  . Worried About Charity fundraiser in the Last Year: Never true  . Ran Out of Food in the Last Year: Never true  Transportation Needs:   . Lack of Transportation (Medical):   Marland Kitchen Lack of Transportation (Non-Medical):   Physical Activity:   . Days of Exercise per Week:   . Minutes of Exercise per Session:   Stress: No Stress Concern Present  . Feeling of Stress : Not at all  Social Connections: Unknown  . Frequency of  Communication with Friends and Family: Not on file  . Frequency of Social Gatherings with Friends and Family: Not on file  . Attends Religious Services: Not on file  . Active Member of Clubs or Organizations: Not on file  . Attends Archivist Meetings: Not on file  . Marital Status: Married  Human resources officer Violence: Not At Risk  . Fear of Current or Ex-Partner: No  . Emotionally Abused: No  . Physically Abused: No  . Sexually Abused: No   Family History  Problem Relation Age of Onset  . Liver disease Mother   . Dementia Mother   . Diabetes Mother        borderline  . Coronary artery disease Father   . Heart attack Father   . Hypertension Father   . Heart disease Father   . Cancer Other        leukemia  . Stroke Maternal Grandfather   . Hyperlipidemia Brother        Amyloidosis  . Diabetes Maternal Grandmother   . Cancer Paternal Uncle        unknown  . Heart attack Paternal Grandmother   . Heart attack Paternal Uncle   . Hypertension Brother   . Diabetes Daughter        borderline  . Colon cancer Neg Hx    Current Outpatient Medications  Medication Sig Dispense Refill  . acetaminophen (TYLENOL) 500 MG tablet Take 1,000 mg by mouth every 6 (six) hours as needed for moderate pain.    Marland Kitchen anastrozole (ARIMIDEX) 1 MG tablet Take 1 tablet (1 mg total) by mouth daily. 90 tablet 3  . aspirin 81 MG EC tablet Take 81 mg by mouth daily.      Marland Kitchen b complex vitamins tablet Take 1 tablet by mouth daily at 12 noon.    . Blood Glucose Monitoring Suppl (ONETOUCH VERIO FLEX SYSTEM) W/DEVICE KIT 1 kit by Does not apply route 3 (three) times daily. 1 kit 1  . buPROPion (WELLBUTRIN XL) 150 MG 24 hr tablet Take 150 mg by mouth daily.    . carvedilol (COREG) 6.25 MG tablet Take 1 tablet (6.25 mg total) by mouth 2 (two) times daily. 60 tablet 3  . Cholecalciferol (VITAMIN D) 50 MCG (2000 UT) tablet Take 2,000 Units by mouth daily.    . empagliflozin (JARDIANCE) 25 MG TABS tablet Take  25 mg by mouth daily. 90 tablet 3  . fluticasone (FLONASE) 50 MCG/ACT nasal spray Place 2 sprays into both nostrils daily as needed for allergies or rhinitis.    Marland Kitchen glucose blood (ONETOUCH VERIO) test strip Test three times daily as directed. DX: E11.9 300 each 3  . Insulin Pen Needle 32G X 4 MM MISC Use 1x a day 100 each 3  . LANTUS SOLOSTAR 100 UNIT/ML Solostar Pen INJECT SUBCUTANEOUSLY 30  UNITS AT BEDTIME 30 mL 3  .  metFORMIN (GLUCOPHAGE) 1000 MG tablet Take 1,000 mg by mouth 2 (two) times daily with a meal.    . omeprazole (PRILOSEC) 40 MG capsule Take 40 mg by mouth daily.    . ONE TOUCH LANCETS MISC Test three times daily as directed. DX: E11.9 300 each 3  . Probiotic Product (PROBIOTIC ADVANCED PO) Take 1 tablet by mouth at bedtime.     . rosuvastatin (CRESTOR) 10 MG tablet Take 10 mg by mouth at bedtime.    . sertraline (ZOLOFT) 100 MG tablet Take 100 mg by mouth daily.    Marland Kitchen spironolactone (ALDACTONE) 25 MG tablet Take 1 tablet (25 mg total) by mouth daily. 90 tablet 3  . TRULICITY 3 EX/5.1ZG SOPN INJECT 3 MG INTO THE SKIN  WEEKLY (Patient taking differently: Inject 3 mg as directed every Saturday. ) 12 pen 2  . Vitamin E 180 MG CAPS Take 180 mg by mouth at bedtime.     . sacubitril-valsartan (ENTRESTO) 49-51 MG Take 1 tablet by mouth 2 (two) times daily. 60 tablet 3   No current facility-administered medications for this encounter.   BP (!) 122/86   Pulse 88   Wt 62.6 kg (138 lb)   SpO2 97%   BMI 25.24 kg/m  General: NAD Neck: No JVD, no thyromegaly or thyroid nodule.  Lungs: Clear to auscultation bilaterally with normal respiratory effort. CV: Nondisplaced PMI.  Heart regular S1/S2, no S3/S4, no murmur.  No peripheral edema.  No carotid bruit.  Normal pedal pulses.  Abdomen: Soft, nontender, no hepatosplenomegaly, no distention.  Skin: Intact without lesions or rashes.  Neurologic: Alert and oriented x 3.  Psych: Normal affect. Extremities: No clubbing or cyanosis.  HEENT:  Normal.   Assessment/Plan: 1. Chronic systolic CHF:  Nonischemic cardiomyopathy.  Echo in 1/21 showed EF down to 30-35% with septal-lateral dyssynchrony.  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  Based on the timeline, the most likely cause for fall in EF seems to be Herceptin use.  She is now off Herceptin, but echo in 3/21 showed EF still in the 30-35% range.  LHC/RHC in 4/21 showed low filling pressures, preserved cardiac output, and no significant CAD.  Cardiac MRI in 5/21 showed EF 36%, no LGE (no evidence for infiltrative disease).  NYHA class II symptoms.  She is not volume overloaded by exam but REDS clip mildly elevated.  - Increase Entresto to 49/51 bid with BMET today and 10 days.  Hopefully, this will add some mild diuresis and improve volume status (mildly elevated REDS clip).  - Continue Coreg 6.25 mg bid.  - Continue spironolactone 25 mg daily.  - Continue Jardiance.  - I think that she should continue to stay off Herceptin with EF still moderately depressed.  - If EF does not recover, she will be a CRT-D candidate with LBBB.  2. 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  - Follow closely with use of Coreg.   3. LBBB: Chronic.  ?LBBB cardiomyopathy.  4. Breast cancer: Herceptin on hold for now.  If EF recovers, would be reasonable to retry Herceptin with close monitoring and ongoing use of cardiomyopathy meds. However, EF still remains low.   Followup with HF pharmacist in 3 wks for med titration, followup with me with repeat echo in 3 months (?CRT-D).   Loralie Champagne 01/17/2020

## 2020-01-18 ENCOUNTER — Encounter: Payer: Self-pay | Admitting: *Deleted

## 2020-01-19 ENCOUNTER — Other Ambulatory Visit: Payer: Self-pay

## 2020-01-19 ENCOUNTER — Encounter: Payer: Self-pay | Admitting: Internal Medicine

## 2020-01-19 ENCOUNTER — Other Ambulatory Visit: Payer: Self-pay | Admitting: *Deleted

## 2020-01-19 DIAGNOSIS — Z794 Long term (current) use of insulin: Secondary | ICD-10-CM

## 2020-01-19 DIAGNOSIS — E1165 Type 2 diabetes mellitus with hyperglycemia: Secondary | ICD-10-CM

## 2020-01-19 MED ORDER — INSULIN PEN NEEDLE 32G X 4 MM MISC: 1.0000 | Freq: Every day | 3 refills | Status: DC

## 2020-01-19 MED ORDER — ONETOUCH DELICA LANCETS 30G MISC: 1.0000 | Freq: Every day | 0 refills | Status: DC

## 2020-01-26 ENCOUNTER — Ambulatory Visit
Admission: RE | Admit: 2020-01-26 | Discharge: 2020-01-26 | Disposition: A | Discharge status: Home / Self Care | Payer: Medicare Other | Source: Ambulatory Visit | Attending: Internal Medicine | Admitting: Internal Medicine

## 2020-01-26 DIAGNOSIS — E041 Nontoxic single thyroid nodule: Secondary | ICD-10-CM | POA: Diagnosis not present

## 2020-01-26 DIAGNOSIS — E010 Iodine-deficiency related diffuse (endemic) goiter: Secondary | ICD-10-CM

## 2020-01-27 ENCOUNTER — Telehealth: Payer: Self-pay | Admitting: Internal Medicine

## 2020-01-27 ENCOUNTER — Other Ambulatory Visit: Payer: Self-pay | Admitting: Internal Medicine

## 2020-01-27 DIAGNOSIS — E042 Nontoxic multinodular goiter: Secondary | ICD-10-CM

## 2020-01-27 NOTE — Telephone Encounter (Signed)
Discussed thyroid ultrasound results with the pt on 01/27/2020 at 1130 AM as below   Thyroid ultrasound 01/26/2020 Nodule # 1:  Location: Right; Superior  Maximum size: 0.9 cm; Other 2 dimensions: 0.7 cm x 0.7 cm  Composition: spongiform (0)  ACR TI-RADS recommendations:  Spongiform nodule does not meet criteria for surveillance or biopsy  _________________________________________________________  Nodule # 2:  Location: Right; Mid  Maximum size: 1.0 cm; Other 2 dimensions: 0.9 cm x 0.6 cm  Composition: spongiform (0)  ACR TI-RADS recommendations:  Spongiform nodule does not meet criteria for surveillance or biopsy  _________________________________________________________  Nodule # 3:  Location: Right; Mid  Maximum size: 1.2 cm; Other 2 dimensions: 1.1 cm x 0.7 cm  Composition: spongiform (0)  ACR TI-RADS recommendations:  Spongiform nodule does not meet criteria for surveillance or biopsy  _________________________________________________________  Nodule # 4:  Location: Right; Inferior  Maximum size: 1.0 cm; Other 2 dimensions: 0.8 cm x 0.8 cm  Composition: spongiform (0)  ACR TI-RADS recommendations:  Spongiform nodule does not meet criteria for surveillance or biopsy  _________________________________________________________  Nodule # 5:  Location: Right; Inferior  Maximum size: 1.5 cm; Other 2 dimensions: 1.4 cm x 0.8 cm  Composition: solid/almost completely solid (2)  Echogenicity: isoechoic (1)  Shape: not taller-than-wide (0)  Margins: smooth (0)  Echogenic foci: none (0)  ACR TI-RADS total points: 3.  ACR TI-RADS risk category: TR3 (3 points).  ACR TI-RADS recommendations:  Nodule meets criteria for surveillance  _________________________________________________________  Nodule # 6:  Location: Left; Superior  Maximum size: 1.2 cm; Other 2 dimensions: 1.1 cm x 0.9 cm  Composition:  cannot determine (2)  Echogenicity: isoechoic (1)  Shape: not taller-than-wide (0)  Margins: ill-defined (0)  Echogenic foci: macrocalcifications (1)  ACR TI-RADS total points: 4.  ACR TI-RADS risk category: TR4 (4-6 points).  ACR TI-RADS recommendations:  Nodule meets criteria for surveillance  _________________________________________________________  Nodule # 7:  Location: Left; Inferior  Maximum size: 4.7 cm; Other 2 dimensions: 4.7 cm x 2.8 cm  Composition: mixed cystic and solid (1)  Echogenicity: isoechoic (1)  Shape: not taller-than-wide (0)  Margins: ill-defined (0)  Echogenic foci: macrocalcifications (1)  ACR TI-RADS total points: 3.  ACR TI-RADS risk category: TR3 (3 points).  ACR TI-RADS recommendations:  Nodule meets criteria for biopsy  _________________________________________________________  No adenopathy  IMPRESSION: Multinodular thyroid.  Left inferior thyroid nodule (labeled 7, TR , 4.7 cm) meets criteria for biopsy, as designated by the newly established ACR TI-RADS criteria, and referral for biopsy is recommended.  Right inferior thyroid nodule (labeled 5, 1.5 cm, TR 3) and the left superior thyroid nodule (labeled 6, 1.2 cm, TR 4) both meet criteria for surveillance, as designated by the newly established ACR TI-RADS criteria. Surveillance ultrasound study recommended to be performed annually up to 5 years.    Will proceed with FNA of left inferior nodule ( #7)  Pt in agreement    [place], MD  [facility] Endocrinology  [facility] [address] [place], [place] [address] Phone: [phone] FAX: [phone]

## 2020-01-28 ENCOUNTER — Other Ambulatory Visit (HOSPITAL_COMMUNITY): Payer: Self-pay | Admitting: Cardiology

## 2020-01-28 ENCOUNTER — Ambulatory Visit (INDEPENDENT_AMBULATORY_CARE_PROVIDER_SITE_OTHER): Payer: Medicare Other

## 2020-01-28 ENCOUNTER — Other Ambulatory Visit: Payer: Self-pay

## 2020-01-28 DIAGNOSIS — Z0000 Encounter for general adult medical examination without abnormal findings: Secondary | ICD-10-CM | POA: Diagnosis not present

## 2020-01-28 NOTE — Patient Instructions (Addendum)
[name] , Thank you for taking time to come for your Medicare Wellness Visit. I appreciate your ongoing commitment to your health goals. Please review the following plan we discussed and let me know if I can assist you in the future.   Screening recommendations/referrals: Colonoscopy: 03/19/2014 Mammogram: 03/13/2019 Bone Density: 11/14/2015 Recommended yearly ophthalmology/optometry visit for glaucoma screening and checkup Recommended yearly dental visit for hygiene and checkup  Vaccinations: Influenza vaccine: 02/18/2019 Pneumococcal vaccine: completed Tdap vaccine: 02/02/2019 Shingles vaccine: never done   Covid-19: completed  Advanced directives: Please bring a copy of your health care power of attorney and living will to the office at your convenience.  Conditions/risks identified: Please continue to do your personal lifestyle choices by: daily care of teeth and gums, regular physical activity (goal should be 5 days a week for 30 minutes), eat a healthy diet, avoid tobacco and drug use, limiting any alcohol intake, taking a low-dose aspirin (if not allergic or have been advised by your provider otherwise) and taking vitamins and minerals as recommended by your provider. Continue doing brain stimulating activities (puzzles, reading, adult coloring books, staying active) to keep memory sharp. Continue to eat heart healthy diet (full of fruits, vegetables, whole grains, lean protein, water--limit salt, fat, and sugar intake) and increase physical activity as tolerated.  Next appointment: Please schedule your next Medicare Wellness Visit with your Nurse Health Advisor in 1 year.   Preventive Care 52 Years and Older, Female Preventive care refers to lifestyle choices and visits with your health care provider that can promote health and wellness. What does preventive care include?  A yearly physical exam. This is also called an annual well check.  Dental exams once or twice a  year.  Routine eye exams. Ask your health care provider how often you should have your eyes checked.  Personal lifestyle choices, including:  Daily care of your teeth and gums.  Regular physical activity.  Eating a healthy diet.  Avoiding tobacco and drug use.  Limiting alcohol use.  Practicing safe sex.  Taking low-dose aspirin every day.  Taking vitamin and mineral supplements as recommended by your health care provider. What happens during an annual well check? The services and screenings done by your health care provider during your annual well check will depend on your age, overall health, lifestyle risk factors, and family history of disease. Counseling  Your health care provider may ask you questions about your:  Alcohol use.  Tobacco use.  Drug use.  Emotional well-being.  Home and relationship well-being.  Sexual activity.  Eating habits.  History of falls.  Memory and ability to understand (cognition).  Work and work Statistician.  Reproductive health. Screening  You may have the following tests or measurements:  Height, weight, and BMI.  Blood pressure.  Lipid and cholesterol levels. These may be checked every 5 years, or more frequently if you are over 60 years old.  Skin check.  Lung cancer screening. You may have this screening every year starting at age 26 if you have a 30-pack-year history of smoking and currently smoke or have quit within the past 15 years.  Fecal occult blood test (FOBT) of the stool. You may have this test every year starting at age 36.  Flexible sigmoidoscopy or colonoscopy. You may have a sigmoidoscopy every 5 years or a colonoscopy every 10 years starting at age 72.  Hepatitis C blood test.  Hepatitis B blood test.  Sexually transmitted disease (STD) testing.  Diabetes screening. This  is done by checking your blood sugar (glucose) after you have not eaten for a while (fasting). You may have this done every 1-3  years.  Bone density scan. This is done to screen for osteoporosis. You may have this done starting at age 40.  Mammogram. This may be done every 1-2 years. Talk to your health care provider about how often you should have regular mammograms. Talk with your health care provider about your test results, treatment options, and if necessary, the need for more tests. Vaccines  Your health care provider may recommend certain vaccines, such as:  Influenza vaccine. This is recommended every year.  Tetanus, diphtheria, and acellular pertussis (Tdap, Td) vaccine. You may need a Td booster every 10 years.  Zoster vaccine. You may need this after age 43.  Pneumococcal 13-valent conjugate (PCV13) vaccine. One dose is recommended after age 52.  Pneumococcal polysaccharide (PPSV23) vaccine. One dose is recommended after age 37. Talk to your health care provider about which screenings and vaccines you need and how often you need them. This information is not intended to replace advice given to you by your health care provider. Make sure you discuss any questions you have with your health care provider. Document Released: 07/08/2015 Document Revised: 02/29/2016 Document Reviewed: 04/12/2015 Elsevier Interactive Patient Education  2017 [place] Prevention in the Home Falls can cause injuries. They can happen to people of all ages. There are many things you can do to make your home safe and to help prevent falls. What can I do on the outside of my home?  Regularly fix the edges of walkways and driveways and fix any cracks.  Remove anything that might make you trip as you walk through a door, such as a raised step or threshold.  Trim any bushes or trees on the path to your home.  Use bright outdoor lighting.  Clear any walking paths of anything that might make someone trip, such as rocks or tools.  Regularly check to see if handrails are loose or broken. Make sure that both sides of any  steps have handrails.  Any raised decks and porches should have guardrails on the edges.  Have any leaves, snow, or ice cleared regularly.  Use sand or salt on walking paths during winter.  Clean up any spills in your garage right away. This includes oil or grease spills. What can I do in the bathroom?  Use night lights.  Install grab bars by the toilet and in the tub and shower. Do not use towel bars as grab bars.  Use non-skid mats or decals in the tub or shower.  If you need to sit down in the shower, use a plastic, non-slip stool.  Keep the floor dry. Clean up any water that spills on the floor as soon as it happens.  Remove soap buildup in the tub or shower regularly.  Attach bath mats securely with double-sided non-slip rug tape.  Do not have throw rugs and other things on the floor that can make you trip. What can I do in the bedroom?  Use night lights.  Make sure that you have a light by your bed that is easy to reach.  Do not use any sheets or blankets that are too big for your bed. They should not hang down onto the floor.  Have a firm chair that has side arms. You can use this for support while you get dressed.  Do not have throw rugs and other  things on the floor that can make you trip. What can I do in the kitchen?  Clean up any spills right away.  Avoid walking on wet floors.  Keep items that you use a lot in easy-to-reach places.  If you need to reach something above you, use a strong step stool that has a grab bar.  Keep electrical cords out of the way.  Do not use floor polish or wax that makes floors slippery. If you must use wax, use non-skid floor wax.  Do not have throw rugs and other things on the floor that can make you trip. What can I do with my stairs?  Do not leave any items on the stairs.  Make sure that there are handrails on both sides of the stairs and use them. Fix handrails that are broken or loose. Make sure that handrails are  as long as the stairways.  Check any carpeting to make sure that it is firmly attached to the stairs. Fix any carpet that is loose or worn.  Avoid having throw rugs at the top or bottom of the stairs. If you do have throw rugs, attach them to the floor with carpet tape.  Make sure that you have a light switch at the top of the stairs and the bottom of the stairs. If you do not have them, ask someone to add them for you. What else can I do to help prevent falls?  Wear shoes that:  Do not have high heels.  Have rubber bottoms.  Are comfortable and fit you well.  Are closed at the toe. Do not wear sandals.  If you use a stepladder:  Make sure that it is fully opened. Do not climb a closed stepladder.  Make sure that both sides of the stepladder are locked into place.  Ask someone to hold it for you, if possible.  Clearly mark and make sure that you can see:  Any grab bars or handrails.  First and last steps.  Where the edge of each step is.  Use tools that help you move around (mobility aids) if they are needed. These include:  Canes.  Walkers.  Scooters.  Crutches.  Turn on the lights when you go into a dark area. Replace any light bulbs as soon as they burn out.  Set up your furniture so you have a clear path. Avoid moving your furniture around.  If any of your floors are uneven, fix them.  If there are any pets around you, be aware of where they are.  Review your medicines with your doctor. Some medicines can make you feel dizzy. This can increase your chance of falling. Ask your doctor what other things that you can do to help prevent falls. This information is not intended to replace advice given to you by your health care provider. Make sure you discuss any questions you have with your health care provider. Document Released: 04/07/2009 Document Revised: 11/17/2015 Document Reviewed: 07/16/2014 Elsevier Interactive Patient Education  2017 [employer].

## 2020-01-28 NOTE — Progress Notes (Addendum)
I connected with Elwyn Lowden today by telephone and verified that I am speaking with the correct person using two identifiers. Location patient: home Location provider: work Persons participating in the virtual visit: Margaret Norman  I discussed the limitations, risks, security and privacy concerns of performing an evaluation and management service by telephone and the availability of in person appointments. I also discussed with the patient that there may be a patient responsible charge related to this service. The patient expressed understanding and verbally consented to this telephonic visit.    Interactive audio and video telecommunications were attempted between this provider and patient, however failed, due to patient having technical difficulties OR patient did not have access to video capability.  We continued and completed visit with audio only.  Some vital signs may be absent or patient reported.   Time Spent with patient on telephone encounter: 20 minutes  Subjective:   Margaret Norman is a 69 y.o. female who presents for Medicare Annual (Subsequent) preventive examination.  Review of Systems    No ROS. Medicare Wellness Virtual Visit. Additional risk factors are reflected in social history. Cardiac Risk Factors include: advanced age (>63mn, >>9women);diabetes mellitus;dyslipidemia;family history of premature cardiovascular disease;hypertension     Objective:    There were no vitals filed for this visit. There is no height or weight on file to calculate BMI.  Advanced Directives 01/28/2020 09/28/2019 08/12/2019 08/12/2019 07/29/2019 07/08/2019 04/15/2019  Does Patient Have a Medical Advance Directive? Yes No No No No No -  Type of Advance Directive - - - - - - -  Does patient want to make changes to medical advance directive? No - Patient declined - - - - - -  Copy of HPress photographerin Chart? - - - - - - -  Would patient like information on creating a  medical advance directive? - No - Patient declined No - Patient declined - No - Patient declined No - Patient declined No - Patient declined    Current Medications (verified) Outpatient Encounter Medications as of 01/28/2020  Medication Sig   acetaminophen (TYLENOL) 500 MG tablet Take 1,000 mg by mouth every 6 (six) hours as needed for moderate pain.   anastrozole (ARIMIDEX) 1 MG tablet Take 1 tablet (1 mg total) by mouth daily.   aspirin 81 MG EC tablet Take 81 mg by mouth daily.     b complex vitamins tablet Take 1 tablet by mouth daily at 12 noon.   Blood Glucose Monitoring Suppl (ONETOUCH VERIO FLEX SYSTEM) W/DEVICE KIT 1 kit by Does not apply route 3 (three) times daily.   buPROPion (WELLBUTRIN XL) 150 MG 24 hr tablet Take 150 mg by mouth daily.   carvedilol (COREG) 6.25 MG tablet Take 1 tablet (6.25 mg total) by mouth 2 (two) times daily.   Cholecalciferol (VITAMIN D) 50 MCG (2000 UT) tablet Take 2,000 Units by mouth daily.   empagliflozin (JARDIANCE) 25 MG TABS tablet Take 25 mg by mouth daily.   fluticasone (FLONASE) 50 MCG/ACT nasal spray Place 2 sprays into both nostrils daily as needed for allergies or rhinitis.   glucose blood (ONETOUCH VERIO) test strip Test three times daily as directed. DX: E11.9   Insulin Pen Needle 32G X 4 MM MISC 1 each by Other route daily. E11.9; THIS IS THE CORRECT PEN NEEDLE. PLEASE FILL RX AS ORDERED   LANTUS SOLOSTAR 100 UNIT/ML Solostar Pen INJECT SUBCUTANEOUSLY 30  UNITS AT BEDTIME   metFORMIN (GLUCOPHAGE) 1000  MG tablet Take 1,000 mg by mouth 2 (two) times daily with a meal.   omeprazole (PRILOSEC) 40 MG capsule Take 40 mg by mouth daily.   OneTouch Delica Lancets 38G MISC 1 each by Does not apply route daily. E11.9   Probiotic Product (PROBIOTIC ADVANCED PO) Take 1 tablet by mouth at bedtime.    rosuvastatin (CRESTOR) 10 MG tablet Take 10 mg by mouth at bedtime.   sacubitril-valsartan (ENTRESTO) 49-51 MG Take 1 tablet by mouth 2 (two) times daily.    sertraline (ZOLOFT) 100 MG tablet Take 100 mg by mouth daily.   spironolactone (ALDACTONE) 25 MG tablet Take 1 tablet (25 mg total) by mouth daily.   TRULICITY 3 YK/5.9DJ SOPN INJECT 3 MG INTO THE SKIN  WEEKLY (Patient taking differently: Inject 3 mg as directed every Saturday. )   Vitamin E 180 MG CAPS Take 180 mg by mouth at bedtime.    No facility-administered encounter medications on file as of 01/28/2020.    Allergies (verified) Invokana [canagliflozin] and Amoxicillin   History: Past Medical History:  Diagnosis Date   Arthritis    Chest pain    Nuclear, adenosine,  December, 2013, low risk nuclear scan with small, moderate in intensity, fixed anteroseptal defect. This is possibly related to an LBBB versus small prior infarct. 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 w/appy   CHOLECYSTECTOMY     ESOPHAGOGASTRODUODENOSCOPY     HERNIA REPAIR     KNEE ARTHROSCOPY Right 11/2005   LIVER BIOPSY     PANCREATIC CYST EXCISION     PORTACATH PLACEMENT Left 04/15/2019   Procedure: INSERTION PORT-A-CATH WITH ULTRASOUND;  Surgeon: Coralie Keens, MD;  Location: [facility];  Service: General;  Laterality: Left;   RIGHT/LEFT HEART CATH AND CORONARY ANGIOGRAPHY  N/A 09/28/2019   Procedure: RIGHT/LEFT HEART CATH AND CORONARY ANGIOGRAPHY;  Surgeon: Larey Dresser, MD;  Location: [facility];  Service: Cardiovascular;  Laterality: N/A;   SPLENECTOMY     TONSILLECTOMY AND ADENOIDECTOMY     Family History  Problem Relation Age of Onset   Liver disease Mother    Dementia Mother    Diabetes Mother        borderline   Coronary artery disease Father    Heart attack Father    Hypertension Father    Heart disease Father    Cancer Other        leukemia   Stroke Maternal Grandfather    Hyperlipidemia Brother        Amyloidosis   Diabetes Maternal Grandmother    Cancer Paternal Uncle        unknown   Heart attack Paternal Grandmother  Heart attack Paternal Uncle    Hypertension Brother    Diabetes Daughter        borderline   Colon cancer Neg Hx    Social History   Socioeconomic History   Marital status: Married    Spouse name: Lissie Hinesley   Number of children: 2   Years of education: Not on file   Highest education level: Not on file  Occupational History   Occupation: CSR    Employer: [employer]   Occupation: retired  Tobacco Use   Smoking status: Never Smoker   Smokeless tobacco: Never Used  Scientific laboratory technician Use: Never used  Substance and Sexual Activity   Alcohol use: No   Drug use: No   Sexual activity: Not Currently  Other Topics Concern   Not on file  Social History Narrative   Regular Exercise -  NO   Social Determinants of Health   Financial Resource Strain: Low Risk    Difficulty of Paying Living Expenses: Not hard at all  Food Insecurity: No Food Insecurity   Worried About Charity fundraiser in the Last Year: Never true   [place] in the Last Year: Never true  Transportation Needs: No Transportation Needs   Lack of Transportation (Medical): No   Lack of Transportation (Non-Medical): No  Physical Activity: Sufficiently Active   Days of Exercise per Week: 5 days   Minutes of  Exercise per Session: 30 min  Stress: No Stress Concern Present   Feeling of Stress : Not at all  Social Connections: Socially Integrated   Frequency of Communication with Friends and Family: More than three times a week   Frequency of Social Gatherings with Friends and Family: More than three times a week   Attends Religious Services: More than 4 times per year   Active Member of [employer] or Organizations: Yes   Attends Music therapist: More than 4 times per year   Marital Status: Married    Tobacco Counseling Counseling given: Not Answered   Clinical Intake:  Pre-visit preparation completed: Yes  Pain : No/denies pain     Nutritional Status: BMI <19  Underweight Nutritional Risks: None Diabetes: Yes CBG done?: No Did pt. bring in CBG monitor from home?: No  How often do you need to have someone help you when you read instructions, pamphlets, or other written materials from your doctor or pharmacy?: 1 - Never What is the last grade level you completed in school?: HSG  Diabetic? yes  Interpreter Needed?: No  Information entered by :: Ivyanna Sibert N. Jakaylee Sasaki, LPN   Activities of Daily Living In your present state of health, do you have any difficulty performing the following activities: 01/28/2020  Hearing? N  Vision? N  Difficulty concentrating or making decisions? N  Walking or climbing stairs? N  Dressing or bathing? N  Doing errands, shopping? N  Preparing Food and eating ? N  Using the Toilet? N  In the past six months, have you accidently leaked urine? N  Do you have problems with loss of bowel control? N  Managing your Medications? N  Managing your Finances? N  Housekeeping or managing your Housekeeping? N  Some recent data might be hidden    Patient Care Team: [name], [place] as PCP - General (Internal Medicine) [name], [name], MD as PCP - Cardiology (Cardiology) Larey Dresser, MD as PCP - Advanced Heart Failure  (Cardiology) [name], RN  as Oncology Nurse Navigator [name], RN as Oncology Nurse Navigator  Indicate any recent Medical Services you may have received from other than [facility] providers in the past year (date may be approximate).     Assessment:   This is a routine wellness examination for Margaret Norman.  Hearing/Vision screen No exam data present  Dietary issues and exercise activities discussed: Current Exercise Habits: Home exercise routine, Type of exercise: walking, Time (Minutes): 30, Frequency (Times/Week): 5, Weekly Exercise (Minutes/Week): 150, Intensity: Mild, Exercise limited by: None identified  Goals       DIET - REDUCE SUGAR INTAKE (pt-stated)      My goal is to get more control of my diabetes and get my A1C numbers in range.      Do the best I can do with my diabetes      Decrease carbohydrates and sugar, exercise more, enjoy life and family.      Patient Stated      Take some time daily to do something just for me only for me. Enjoy the upcoming beach trip, life and family.      Patient Stated      Maintain my current health status.       Depression Screen PHQ 2/9 Scores 01/28/2020 02/02/2019 02/02/2019 01/31/2018 01/18/2017 08/25/2015 04/27/2015  PHQ - 2 Score 0 0 0 6 2 1 2   PHQ- 9 Score - - 0 15 3 - -    Fall Risk Fall Risk  01/28/2020 02/02/2019 01/31/2018 01/18/2017 01/11/2017  Falls in the past year? 1 1 No Yes Yes  Comment - - - - Emmi Telephone Survey: data to providers prior to load  Number falls in past yr: 1 0 - 1 2 or more  Comment - - - - Emmi Telephone Survey Actual Response = 5  Injury with Fall? 0 0 - No No  Risk for fall due to : Impaired balance/gait Impaired balance/gait - Impaired balance/gait;Impaired mobility -  Follow up Falls evaluation completed Falls prevention discussed - Falls prevention discussed -    Any stairs in or around the home? No  If so, are there any without handrails? No  Home free of loose throw rugs in walkways, pet  beds, electrical cords, etc? Yes  Adequate lighting in your home to reduce risk of falls? Yes   ASSISTIVE DEVICES UTILIZED TO PREVENT FALLS:  Life alert? No  Use of a cane, walker or w/c? Yes  Grab bars in the bathroom? No  Shower chair or bench in shower? No  Elevated toilet seat or a handicapped toilet? No   TIMED UP AND GO:  Was the test performed? No .  Length of time to ambulate 10 feet: 0 sec.   Gait steady and fast without use of assistive device  Cognitive Function:     6CIT Screen 02/02/2019  What Year? 0 points  What month? 0 points  What time? 0 points  Count back from 20 0 points  Months in reverse 0 points  Repeat phrase 0 points  Total Score 0    Immunizations Immunization History  Administered Date(s) Administered   Fluad Quad(high Dose 65+) 02/18/2019   Influenza Split 04/09/2011, 06/12/2012   Influenza Whole 04/05/2009   Influenza, High Dose Seasonal PF 04/30/2016, 03/12/2017, 03/04/2018   Influenza,inj,Quad PF,6+ Mos 04/09/2013, 05/14/2014, 04/26/2015   Meningococcal Polysaccharide 08/31/2008   PFIZER SARS-COV-2 Vaccination 08/30/2019, 10/03/2019   Pneumococcal Conjugate-13 09/13/2014   Pneumococcal Polysaccharide-23 08/31/2008, 12/29/2015   Td 03/26/1999, 10/18/2008  Tdap 02/02/2019   Zoster 12/23/2014    TDAP status: Up to date Flu Vaccine status: Up to date Pneumococcal vaccine status: Up to date Covid-19 vaccine status: Completed vaccines  Qualifies for Shingles Vaccine? Yes   Zostavax completed Yes   Shingrix Completed?: No.    Education has been provided regarding the importance of this vaccine. Patient has been advised to call insurance company to determine out of pocket expense if they have not yet received this vaccine. Advised may also receive vaccine at local pharmacy or Health Dept. Verbalized acceptance and understanding.  Screening Tests Health Maintenance  Topic Date Due   URINE MICROALBUMIN  12/31/2017   OPHTHALMOLOGY EXAM   08/14/2019   MAMMOGRAM  11/15/2019   INFLUENZA VACCINE  01/24/2020   FOOT EXAM  02/02/2020   HEMOGLOBIN A1C  07/16/2020   COLONOSCOPY  03/19/2024   TETANUS/TDAP  02/01/2029   DEXA SCAN  Completed   COVID-19 Vaccine  Completed   Hepatitis C Screening  Completed   PNA vac Low Risk Adult  Completed    Health Maintenance  Health Maintenance Due  Topic Date Due   URINE MICROALBUMIN  12/31/2017   OPHTHALMOLOGY EXAM  08/14/2019   MAMMOGRAM  11/15/2019   INFLUENZA VACCINE  01/24/2020    Colorectal cancer screening: Completed 03/19/2014. Repeat every 10 years Mammogram status: Completed 03/13/2019. Repeat every year Bone Density status: Completed 11/14/2015. Results reflect: Bone density results: OSTEOPENIA. Repeat every 2-3 years.  Lung Cancer Screening: (Low Dose CT Chest recommended if Age 19-80 years, 30 pack-year currently smoking OR have quit w/in 15years.) does not qualify.   Lung Cancer Screening Referral: no  Additional Screening:  Hepatitis C Screening: does qualify; Completed yes  Vision Screening: Recommended annual ophthalmology exams for early detection of glaucoma and other disorders of the eye. Is the patient up to date with their annual eye exam?  Yes  Who is the provider or what is the name of the office in which the patient attends annual eye exams? Bufford Buttner, OD If pt is not established with a provider, would they like to be referred to a provider to establish care? No .   Dental Screening: Recommended annual dental exams for proper oral hygiene  Community Resource Referral / Chronic Care Management: CRR required this visit?  No   CCM required this visit?   No     Plan:     I have personally reviewed and noted the following in the patients chart:   Medical and social history Use of alcohol, tobacco or illicit drugs  Current medications and supplements Functional ability and status Nutritional status Physical activity Advanced directives List  of other physicians Hospitalizations, surgeries, and ER visits in previous 12 months Vitals Screenings to include cognitive, depression, and falls Referrals and appointments  In addition, I have reviewed and discussed with patient certain preventive protocols, quality metrics, and best practice recommendations. A written personalized care plan for preventive services as well as general preventive health recommendations were provided to patient.     Sheral Flow, LPN   4/0/9811   Nurse Notes:  Patient is cogitatively intact. There were no vitals filed for this visit. There is no height or weight on file to calculate BMI.  Patient stated that she uses a cane at time for unsteady gait and balance.   Medical screening examination/treatment/procedure(s) were performed by non-physician practitioner and as supervising physician I was immediately available for consultation/collaboration.  I agree with above. Lew Dawes, MD

## 2020-01-29 ENCOUNTER — Encounter (HOSPITAL_COMMUNITY): Payer: Self-pay

## 2020-02-01 ENCOUNTER — Other Ambulatory Visit (HOSPITAL_COMMUNITY): Payer: Self-pay | Admitting: *Deleted

## 2020-02-01 ENCOUNTER — Other Ambulatory Visit: Payer: Self-pay

## 2020-02-01 ENCOUNTER — Ambulatory Visit (HOSPITAL_COMMUNITY)
Admission: RE | Admit: 2020-02-01 | Discharge: 2020-02-01 | Disposition: A | Discharge status: Home / Self Care | Payer: Medicare Other | Source: Ambulatory Visit | Attending: Cardiology | Admitting: Cardiology

## 2020-02-01 DIAGNOSIS — I5022 Chronic systolic (congestive) heart failure: Secondary | ICD-10-CM | POA: Insufficient documentation

## 2020-02-01 LAB — BASIC METABOLIC PANEL
Anion gap: 11 (ref 5–15)
BUN: 17 mg/dL (ref 8–23)
CO2: 27 mmol/L (ref 22–32)
Calcium: 9.5 mg/dL (ref 8.9–10.3)
Chloride: 105 mmol/L (ref 98–111)
Creatinine, Ser: 0.82 mg/dL (ref 0.44–1.00)
GFR calc Af Amer: >60 mL/min (ref >60)
GFR calc non Af Amer: >60 mL/min (ref >60)
Glucose, Bld: 116 mg/dL — ABNORMAL HIGH (ref 70–99)
Potassium: 4.7 mmol/L (ref 3.5–5.1)
Sodium: 143 mmol/L (ref 135–145)

## 2020-02-04 ENCOUNTER — Ambulatory Visit: Payer: Self-pay

## 2020-02-08 ENCOUNTER — Ambulatory Visit: Payer: Medicare Other | Admitting: Family

## 2020-02-09 ENCOUNTER — Other Ambulatory Visit (HOSPITAL_COMMUNITY): Payer: Medicare Other

## 2020-02-15 ENCOUNTER — Ambulatory Visit (HOSPITAL_COMMUNITY)
Admission: RE | Admit: 2020-02-15 | Discharge: 2020-02-15 | Disposition: A | Discharge status: Home / Self Care | Payer: Medicare Other | Source: Ambulatory Visit | Attending: Internal Medicine | Admitting: Internal Medicine

## 2020-02-15 ENCOUNTER — Encounter (HOSPITAL_COMMUNITY): Payer: Self-pay

## 2020-02-15 ENCOUNTER — Other Ambulatory Visit: Payer: Self-pay

## 2020-02-15 VITALS — BP 118/72 | HR 85 | Wt 138.2 lb

## 2020-02-15 DIAGNOSIS — I5022 Chronic systolic (congestive) heart failure: Secondary | ICD-10-CM | POA: Diagnosis not present

## 2020-02-15 DIAGNOSIS — I428 Other cardiomyopathies: Secondary | ICD-10-CM | POA: Diagnosis not present

## 2020-02-15 DIAGNOSIS — Z853 Personal history of malignant neoplasm of breast: Secondary | ICD-10-CM | POA: Insufficient documentation

## 2020-02-15 DIAGNOSIS — I447 Left bundle-branch block, unspecified: Secondary | ICD-10-CM | POA: Insufficient documentation

## 2020-02-15 MED ORDER — CARVEDILOL 6.25 MG PO TABS: 9.3750 mg | ORAL_TABLET | Freq: Two times a day (BID) | ORAL | 11 refills | Status: DC

## 2020-02-15 NOTE — Patient Instructions (Signed)
It was a pleasure seeing you today!  MEDICATIONS: -We are changing your medications today -Increase carvedilol to 9.375 mg (1.5 tablets) by mouth twice daily -Call if you have questions about your medications.   NEXT APPOINTMENT: Return to clinic in 6 weeks with Dr. Aundra Dubin.  In general, to take care of your heart failure: -Limit your fluid intake to 2 Liters (half-gallon) per day.   -Limit your salt intake to ideally 2-3 grams (2000-3000 mg) per day. -Weigh yourself daily and record, and bring that "weight diary" to your next appointment.  (Weight gain of 2-3 pounds in 1 day typically means fluid weight.) -The medications for your heart are to help your heart and help you live longer.   -Please contact us before stopping any of your heart medications.  Call the clinic at [phone] with questions or to reschedule future appointments.

## 2020-02-15 NOTE — Progress Notes (Signed)
PCP: [name], FNP Oncology: Dr. Lindi Adie HF cardiology: Dr. Aundra Dubin   HPI: 69 y.o. with history of DM, chronic LBBB, and breast cancer was referred by Dr. Lindi Adie for workup of fall in EF.  She was diagnosed with right breast cancer, ER+/PR+/HER2+ in 02/2019.  She had right lumpectomy in 03/2019.  Starting in 04/2019, she had been getting 12 cycles of Taxol/Herceptin to be followed by Herceptin alone to complete a year. Echo in 03/2019 showed EF 55-60% per report (looked more like 50% on review, likely due to septal-lateral dyssynchrony).  Her repeat echo for Herceptin screening was done in 06/2019, showing EF down to 30-35% with septal-lateral dyssynchrony.  Herceptin has been stopped for now.  She is currently undergoing radiation therapy.   ECHO on 09/15/19 showed that EF remained 30-35% with prominent septal-lateral dyssynchrony.  Patient has a strong family history of heart problems.  Father died from "ruptured aorta."  Sister had heart valve repair, one brother has a pacemaker, and another brother has AL amyloidosis.   LHC/RHC was done on 09/28/19. This showed normal filling pressures, preserved cardiac output, and no significant CAD. Cardiac MRI in 10/2019 showed EF 36% with septal-lateral dyssynchrony, RV EF normal 45%, no LGE.   Returned to HF clinic 01/15/20 for followup of cardiomyopathy with Dr. Aundra Dubin. Weight was down 1 lb.  She was SOB after walking about 200 feet, maybe a bit worse gradually.  No orthopnea/PND.  No chest pain.  She still had episodes of lightheadedness with standing, no syncope or falls.  BP was stable.  Today she returns to HF clinic for pharmacist medication titration. At last visit with MD, Delene Loll was increased to 49/51 mg BID. Overall she feels ok today. Endorses feeling some fatigue, but is getting thyroid biopsy tomorrow AM to see if it is related. She still reports occasional dizziness with standing which subsides in seconds, no syncope. This is  not worse since increasing Entresto. Endorses mild chest pain last week and occasional tightness that she attributes to stress. This has not recurred. Denies SOB or DOE. Home weights stable, ranging 135-138 lbs. No LEE on exam. Denies PND/orthopnea. Tolerating current medication regimen and doesn't have trouble affording medications.  HF Medications: Carvedilol 6.25 mg BID Entresto 49/51 mg BID Spironolactone 25 mg daily Empagliflozin 25 mg daily  Has the patient been experiencing any side effects to the medications prescribed?  No  Does the patient have any problems obtaining medications due to transportation or finances?   No, has [facility] Medicare Advantage prescription insurance.  Understanding of regimen: good Understanding of indications: good Potential of compliance: good Patient understands to avoid NSAIDs. Patient understands to avoid decongestants.    Pertinent Lab Values: 02/01/20 . Serum creatinine 0.82, BUN 17, Potassium 4.7, Sodium 143, BNP 50.5 pg/mL  Vital Signs: . Weight: 138.2 lbs (last clinic weight: 138 lbs) . Blood pressure: 118/72 mmHg - didn't take BP medications this morning  . Heart rate: 85 bpm  Assessment: 1. Chronic systolic CHF:  Nonischemic cardiomyopathy.  Echo on 07/20/19 showed EF down to 30-35% with septal-lateral dyssynchrony.   After reviewing the prior ECHO in 03/2019, Dr. Aundra Dubin thinks there was a mild pre-existing cardiomyopathy (possibly LBBB cardiomyopathy), EF looked like 50% (not normal).  Based on the timeline, the most likely cause for fall in EF seemed to be Herceptin use.  She is off Herceptin, but 09/15/19 ECHO showed EF still in the 30-35% range.  LHC/RHC on 09/28/19 showed low filling pressures, preserved cardiac output,  and no significant CAD.  Cardiac MRI in 11/18/19 showed EF 36%, no LGE (no evidence for infiltrative disease).   - NYHA class II symptoms. Not volume overloaded on exam. - Increase carvedilol to 9.375 mg BID - Continue  Entresto 49-51 mg BID  - Continue spironolactone 25 mg daily - Continue empagliflozin 25 mg daily  - Dr. Aundra Dubin previously commented that she should continue to stay off Herceptin with EF still moderately depressed.  - If EF does not recover, she will be a CRT-D candidate with LBBB.  - ECHO 03/28/20  2. 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  - Follow closely with increase in carvedilol.    3. LBBB: Chronic.  ?LBBB cardiomyopathy.   4. Breast cancer: Herceptin on hold for now.  If EF recovers, would be reasonable to retry Herceptin with close monitoring and ongoing use of cardiomyopathy medications per Dr. Aundra Dubin 01/15/20. However, EF remains low.   Plan: 1) Medication changes: Based on clinical presentation, vital signs and recent labs will increase carvedilol to 9.375 mg BID. 2) Follow-up: ECHO and follow-up with Dr. Aundra Dubin on 03/28/20.   Audry Riles, PharmD, BCPS, BCCP, CPP Heart Failure Clinic Pharmacist [phone]

## 2020-02-16 ENCOUNTER — Other Ambulatory Visit (HOSPITAL_COMMUNITY)
Admission: RE | Admit: 2020-02-16 | Discharge: 2020-02-16 | Disposition: A | Discharge status: Home / Self Care | Payer: Medicare Other | Source: Ambulatory Visit | Attending: Radiology | Admitting: Radiology

## 2020-02-16 ENCOUNTER — Encounter: Payer: Self-pay | Admitting: Family

## 2020-02-16 ENCOUNTER — Ambulatory Visit
Admission: RE | Admit: 2020-02-16 | Discharge: 2020-02-16 | Disposition: A | Discharge status: Home / Self Care | Payer: Medicare Other | Source: Ambulatory Visit | Attending: Internal Medicine | Admitting: Internal Medicine

## 2020-02-16 DIAGNOSIS — Z853 Personal history of malignant neoplasm of breast: Secondary | ICD-10-CM | POA: Insufficient documentation

## 2020-02-16 DIAGNOSIS — E042 Nontoxic multinodular goiter: Secondary | ICD-10-CM

## 2020-02-16 DIAGNOSIS — E041 Nontoxic single thyroid nodule: Secondary | ICD-10-CM | POA: Diagnosis not present

## 2020-02-17 ENCOUNTER — Other Ambulatory Visit: Payer: Self-pay

## 2020-02-17 ENCOUNTER — Ambulatory Visit (INDEPENDENT_AMBULATORY_CARE_PROVIDER_SITE_OTHER): Payer: Medicare Other | Admitting: Family

## 2020-02-17 ENCOUNTER — Encounter: Payer: Self-pay | Admitting: Family

## 2020-02-17 ENCOUNTER — Ambulatory Visit (INDEPENDENT_AMBULATORY_CARE_PROVIDER_SITE_OTHER): Payer: Medicare Other

## 2020-02-17 VITALS — BP 116/78 | HR 87 | Temp 98.5°F | Ht 62.0 in | Wt 137.0 lb

## 2020-02-17 DIAGNOSIS — C50911 Malignant neoplasm of unspecified site of right female breast: Secondary | ICD-10-CM

## 2020-02-17 DIAGNOSIS — R5383 Other fatigue: Secondary | ICD-10-CM | POA: Diagnosis not present

## 2020-02-17 DIAGNOSIS — M542 Cervicalgia: Secondary | ICD-10-CM | POA: Diagnosis not present

## 2020-02-17 DIAGNOSIS — E538 Deficiency of other specified B group vitamins: Secondary | ICD-10-CM | POA: Diagnosis not present

## 2020-02-17 NOTE — Progress Notes (Signed)
Margaret Norman is a 69 y.o. female with the following history as recorded in EpicCare:  Patient Active Problem List   Diagnosis Date Noted  . Port-A-Cath in place 06/10/2019  . Breast cancer ([place]) 04/15/2019  . Second degree AV block   . Malignant neoplasm of upper-outer quadrant of right breast in female, estrogen receptor positive ([place]) 03/25/2019  . Thyromegaly 01/10/2018  . Overweight (BMI 25.0-29.9) 08/13/2017  . Upper respiratory tract infection 07/30/2017  . Type 2 diabetes mellitus with hyperglycemia, with long-term current use of insulin ([place]) 03/19/2017  . Thiamine deficiency 01/04/2017  . 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 Deficiency anemia 04/30/2016  . Routine general medical examination at a health care facility 04/27/2015  . Abdominal wall hernia 04/26/2015  . Family history of hemochromatosis 09/13/2014  . Acute maxillary sinusitis 05/14/2014  . Personal history of colonic polyps 03/10/2014  . 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 Lumbosacral spondylosis without myelopathy 05/12/2013  . Insomnia, persistent 02/04/2013  . Obesity (BMI 30-39.9) 02/04/2013  . Unspecified asthma, with exacerbation 09/22/2012  . Visit for screening mammogram 07/04/2012  . Nephrolithiasis   . HTN (hypertension)   . Gallstones   . LBBB (left bundle branch block)   . Leukocytosis 12/20/2008  . Hyperlipidemia with target LDL less than 100 09/04/2007  . Chronic depression 09/04/2007  . GERD 09/04/2007    Current Outpatient Medications  Medication Sig Dispense Refill  . acetaminophen (TYLENOL) 500 MG tablet Take 1,000 mg by mouth every 6 (six) hours as needed for moderate pain.    Marland Kitchen anastrozole (ARIMIDEX) 1 MG tablet Take 1 tablet (1 mg total) by mouth daily. 90 tablet 3  . aspirin 81 MG EC tablet Take 81 mg by mouth daily.      Marland Kitchen b complex vitamins tablet Take 1 tablet by mouth daily at 12 noon.    . Blood Glucose Monitoring Suppl (ONETOUCH VERIO FLEX SYSTEM)  W/DEVICE KIT 1 kit by Does not apply route 3 (three) times daily. 1 kit 1  . buPROPion (WELLBUTRIN XL) 150 MG 24 hr tablet Take 150 mg by mouth daily.    . carvedilol (COREG) 6.25 MG tablet Take 1.5 tablets (9.375 mg total) by mouth 2 (two) times daily. 90 tablet 11  . Cholecalciferol (VITAMIN D) 50 MCG (2000 UT) tablet Take 2,000 Units by mouth daily.    . empagliflozin (JARDIANCE) 25 MG TABS tablet Take 25 mg by mouth daily. 90 tablet 3  . fluticasone (FLONASE) 50 MCG/ACT nasal spray Place 2 sprays into both nostrils daily as needed for allergies or rhinitis.    Marland Kitchen glucose blood (ONETOUCH VERIO) test strip Test three times daily as directed. DX: E11.9 300 each 3  . Insulin Pen Needle 32G X 4 MM MISC 1 each by Other route daily. E11.9; THIS IS THE CORRECT PEN NEEDLE. PLEASE FILL RX AS ORDERED 100 each 3  . LANTUS SOLOSTAR 100 UNIT/ML Solostar Pen INJECT SUBCUTANEOUSLY 30  UNITS AT BEDTIME 30 mL 3  . metFORMIN (GLUCOPHAGE) 1000 MG tablet Take 1,000 mg by mouth 2 (two) times daily with a meal.    . omeprazole (PRILOSEC) 40 MG capsule Take 40 mg by mouth daily.    Glory Rosebush Delica Lancets 00Q MISC 1 each by Does not apply route daily. E11.9 100 each 0  . Probiotic Product (PROBIOTIC ADVANCED PO) Take 1 tablet by mouth at bedtime.     . rosuvastatin (CRESTOR) 10 MG tablet Take 10 mg by mouth at bedtime.    Marland Kitchen  sacubitril-valsartan (ENTRESTO) 49-51 MG Take 1 tablet by mouth 2 (two) times daily. 60 tablet 3  . sertraline (ZOLOFT) 100 MG tablet Take 100 mg by mouth daily.    Marland Kitchen spironolactone (ALDACTONE) 25 MG tablet Take 1 tablet (25 mg total) by mouth daily. 90 tablet 3  . TRULICITY 3 VE/7.2CN SOPN INJECT 3 MG INTO THE SKIN  WEEKLY (Patient taking differently: Inject 3 mg as directed every Saturday. ) 12 pen 2  . Vitamin E 180 MG CAPS Take 180 mg by mouth at bedtime.      No current facility-administered medications for this visit.    Allergies: Invokana [canagliflozin] and Amoxicillin  Past Medical  History:  Diagnosis Date  . Arthritis   . Chest pain    Nuclear, adenosine,  December, 2013, low risk nuclear scan with small, moderate in intensity, fixed anteroseptal defect. This is possibly related to an LBBB versus small prior infarct. No ischemia  . Depression   . Gallstones 11-06  . GERD (gastroesophageal reflux disease)   . History of kidney stones   . HTN (hypertension)   . Hyperlipidemia   . Kidney mass 01/28/2014  . LBBB (left bundle branch block)   . Nephrolithiasis   . Pneumonia   . 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 Type II or unspecified type diabetes mellitus without mention of complication, not stated as uncontrolled     Past Surgical History:  Procedure Laterality Date  . APPENDECTOMY    . BREAST LUMPECTOMY WITH RADIOACTIVE SEED AND SENTINEL LYMPH NODE BIOPSY Right 04/15/2019   Procedure: RIGHT BREAST PARTIAL MASTECTOMY WITH RADIOACTIVE SEED AND SENTINEL LYMPH NODE BIOPSY;  Surgeon: Coralie Keens, MD;  Location: [facility];  Service: General;  Laterality: Right;  . CESAREAN SECTION     x2 ? w/appy  . CHOLECYSTECTOMY    . ESOPHAGOGASTRODUODENOSCOPY    . HERNIA REPAIR    . KNEE ARTHROSCOPY Right 11/2005  . LIVER BIOPSY    . PANCREATIC CYST EXCISION    . PORTACATH PLACEMENT Left 04/15/2019   Procedure: INSERTION PORT-A-CATH WITH ULTRASOUND;  Surgeon: Coralie Keens, MD;  Location: [facility];  Service: General;  Laterality: Left;  . RIGHT/LEFT HEART CATH AND CORONARY ANGIOGRAPHY N/A 09/28/2019   Procedure: RIGHT/LEFT HEART CATH AND CORONARY ANGIOGRAPHY;  Surgeon: Larey Dresser, MD;  Location: [facility];  Service: Cardiovascular;  Laterality: N/A;  . SPLENECTOMY    . TONSILLECTOMY AND ADENOIDECTOMY      Family History  Problem Relation Age of Onset  . Liver disease Mother   . Dementia Mother   . Diabetes Mother        borderline  . Coronary artery disease Father   . Heart attack Father   . Hypertension Father   . Heart disease Father    . Cancer Other        leukemia  . Stroke Maternal Grandfather   . Hyperlipidemia Brother        Amyloidosis  . Diabetes Maternal Grandmother   . Cancer Paternal Uncle        unknown  . Heart attack Paternal Grandmother   . Heart attack Paternal Uncle   . Hypertension Brother   . Diabetes Daughter        borderline  . Colon cancer Neg Hx     Social History   Tobacco Use  . Smoking status: Never Smoker  . Smokeless tobacco: Never Used  Substance Use Topics  . Alcohol use: No    Subjective:  Patient presents for 6 month follow-up on chronic care needs; notes in general, she has just not been feeling well; more tired than usual; had thyroid biopsy yesterday; has been having increased pain in her neck/ right shoulder- feels like pain radiates into back of her head; Since last seen, diagnosed with CHF and medications have been changed; also taking Jardiance for diabetes along with insulin, Trulicity and Metformin;   Objective:  Vitals:   02/17/20 0929  BP: 116/78  Pulse: 87  Temp: 98.5 F (36.9 C)  TempSrc: Oral  SpO2: 98%  Weight: 137 lb (62.1 kg)  Height: _0  (1.575 m)    General: Well developed, well nourished, in no acute distress  Skin : Warm and dry.  Head: Normocephalic and atraumatic  Eyes: Sclera and conjunctiva clear; pupils round and reactive to light; extraocular movements intact  Ears: External normal; canals clear; tympanic membranes normal  Oropharynx: Pink, supple. No suspicious lesions  Neck: Supple without thyromegaly, adenopathy  Lungs: Respirations unlabored; clear to auscultation bilaterally without wheeze, rales, rhonchi  CVS exam: normal rate and regular rhythm.  Musculoskeletal: No deformities; no active joint inflammation  Extremities: No edema, cyanosis, clubbing  Vessels: Symmetric bilaterally  Neurologic: Alert and oriented; speech intact; face symmetrical; moves all extremities well; CNII-XII intact without focal deficit  Assessment:    1. Other fatigue   2. B12 deficiency   3. Neck pain   4. Malignant neoplasm of right female breast, unspecified estrogen receptor status, unspecified site of breast ([place])     Plan:  1. Will update labs today; ? If patient is chronically dehydrated; depending on labs, will reach out to endocrine to see if stopping Jardiance could be considered; 2. Check B12 level; 3. Update cervical X-ray; 4. She understands to schedule follow-up with oncology;  Handicapped placard given as requested;   Time spent 30+ minutes with patient today;    No follow-ups on file.  Orders Placed This Encounter  Procedures  . DG Cervical Spine 2 or 3 views    Standing Status:   Future    Number of Occurrences:   1    Standing Expiration Date:   02/16/2021    Order Specific Question:   Reason for Exam (SYMPTOM  OR DIAGNOSIS REQUIRED)    Answer:   neck pain    Order Specific Question:   Preferred imaging location?    Answer:   [facility]    Order Specific Question:   Radiology Contrast Protocol - do NOT remove file path    Answer:   [email or web address]  . CBC with Differential/Platelet    Standing Status:   Future    Number of Occurrences:   1    Standing Expiration Date:   02/16/2021  . Comp Met (CMET)    Standing Status:   Future    Number of Occurrences:   1    Standing Expiration Date:   02/16/2021  . Magnesium    Standing Status:   Future    Number of Occurrences:   1    Standing Expiration Date:   02/16/2021  . B12    Standing Status:   Future    Number of Occurrences:   1    Standing Expiration Date:   02/16/2021    Requested Prescriptions    No prescriptions requested or ordered in this encounter

## 2020-02-18 ENCOUNTER — Encounter: Payer: Self-pay | Admitting: Internal Medicine

## 2020-02-18 ENCOUNTER — Encounter: Payer: Self-pay | Admitting: Hematology and Oncology

## 2020-02-18 LAB — CBC WITH DIFFERENTIAL/PLATELET
Absolute Monocytes: 1372 cells/uL — ABNORMAL HIGH (ref 200–950)
Basophils Absolute: 73 cells/uL (ref 0–200)
Basophils Relative: 0.5 %
Eosinophils Absolute: 248 cells/uL (ref 15–500)
Eosinophils Relative: 1.7 %
HCT: 39 % (ref 35.0–45.0)
Hemoglobin: 12.7 g/dL (ref 11.7–15.5)
Lymphs Abs: 3927 cells/uL — ABNORMAL HIGH (ref 850–3900)
MCH: 29.5 pg (ref 27.0–33.0)
MCHC: 32.6 g/dL (ref 32.0–36.0)
MCV: 90.7 fL (ref 80.0–100.0)
MPV: 11.9 fL (ref 7.5–12.5)
Monocytes Relative: 9.4 %
Neutro Abs: 8979 cells/uL — ABNORMAL HIGH (ref 1500–7800)
Neutrophils Relative %: 61.5 %
Platelets: 485 10*3/uL — ABNORMAL HIGH (ref 140–400)
RBC: 4.3 10*6/uL (ref 3.80–5.10)
RDW: 13.2 % (ref 11.0–15.0)
Total Lymphocyte: 26.9 %
WBC: 14.6 10*3/uL — ABNORMAL HIGH (ref 3.8–10.8)

## 2020-02-18 LAB — COMPREHENSIVE METABOLIC PANEL
AG Ratio: 2.3 (calc) (ref 1.0–2.5)
ALT: 9 U/L (ref 6–29)
AST: 11 U/L (ref 10–35)
Albumin: 4.5 g/dL (ref 3.6–5.1)
Alkaline phosphatase (APISO): 69 U/L (ref 37–153)
BUN: 22 mg/dL (ref 7–25)
CO2: 25 mmol/L (ref 20–32)
Calcium: 10.2 mg/dL (ref 8.6–10.4)
Chloride: 107 mmol/L (ref 98–110)
Creat: 0.85 mg/dL (ref 0.50–0.99)
Globulin: 2 g/dL (calc) (ref 1.9–3.7)
Glucose, Bld: 103 mg/dL — ABNORMAL HIGH (ref 65–99)
Potassium: 4.7 mmol/L (ref 3.5–5.3)
Sodium: 143 mmol/L (ref 135–146)
Total Bilirubin: 0.3 mg/dL (ref 0.2–1.2)
Total Protein: 6.5 g/dL (ref 6.1–8.1)

## 2020-02-18 LAB — CYTOLOGY - NON PAP

## 2020-02-18 LAB — VITAMIN B12: Vitamin B-12: 290 pg/mL (ref 200–1100)

## 2020-02-18 LAB — MAGNESIUM: Magnesium: 1.7 mg/dL (ref 1.5–2.5)

## 2020-02-19 ENCOUNTER — Encounter: Payer: Self-pay | Admitting: Family

## 2020-02-19 ENCOUNTER — Other Ambulatory Visit: Payer: Self-pay | Admitting: *Deleted

## 2020-02-19 ENCOUNTER — Telehealth: Payer: Self-pay | Admitting: Hematology and Oncology

## 2020-02-19 DIAGNOSIS — C50411 Malignant neoplasm of upper-outer quadrant of right female breast: Secondary | ICD-10-CM

## 2020-02-19 NOTE — Telephone Encounter (Signed)
Scheduled appt per 8/27 sch msg- pt is aware of appt date and time

## 2020-03-16 ENCOUNTER — Emergency Department (HOSPITAL_COMMUNITY): Payer: Medicare Other

## 2020-03-16 ENCOUNTER — Other Ambulatory Visit: Payer: Self-pay

## 2020-03-16 ENCOUNTER — Emergency Department (HOSPITAL_COMMUNITY)
Admission: EM | Admit: 2020-03-16 | Discharge: 2020-03-16 | Disposition: A | Discharge status: Home / Self Care | Payer: Medicare Other | Attending: Emergency Medicine | Admitting: Emergency Medicine

## 2020-03-16 ENCOUNTER — Encounter (HOSPITAL_COMMUNITY): Payer: Self-pay

## 2020-03-16 DIAGNOSIS — W010XXA Fall on same level from slipping, tripping and stumbling without subsequent striking against object, initial encounter: Secondary | ICD-10-CM | POA: Insufficient documentation

## 2020-03-16 DIAGNOSIS — I10 Essential (primary) hypertension: Secondary | ICD-10-CM | POA: Insufficient documentation

## 2020-03-16 DIAGNOSIS — W19XXXA Unspecified fall, initial encounter: Secondary | ICD-10-CM | POA: Diagnosis not present

## 2020-03-16 DIAGNOSIS — S4992XA Unspecified injury of left shoulder and upper arm, initial encounter: Secondary | ICD-10-CM | POA: Diagnosis present

## 2020-03-16 DIAGNOSIS — Z743 Need for continuous supervision: Secondary | ICD-10-CM | POA: Diagnosis not present

## 2020-03-16 DIAGNOSIS — S42202A Unspecified fracture of upper end of left humerus, initial encounter for closed fracture: Secondary | ICD-10-CM | POA: Diagnosis not present

## 2020-03-16 DIAGNOSIS — Z79899 Other long term (current) drug therapy: Secondary | ICD-10-CM | POA: Insufficient documentation

## 2020-03-16 DIAGNOSIS — S42212A Unspecified displaced fracture of surgical neck of left humerus, initial encounter for closed fracture: Secondary | ICD-10-CM | POA: Insufficient documentation

## 2020-03-16 DIAGNOSIS — R52 Pain, unspecified: Secondary | ICD-10-CM | POA: Diagnosis not present

## 2020-03-16 DIAGNOSIS — Z794 Long term (current) use of insulin: Secondary | ICD-10-CM | POA: Diagnosis not present

## 2020-03-16 DIAGNOSIS — Z7982 Long term (current) use of aspirin: Secondary | ICD-10-CM | POA: Diagnosis not present

## 2020-03-16 DIAGNOSIS — E1165 Type 2 diabetes mellitus with hyperglycemia: Secondary | ICD-10-CM | POA: Diagnosis not present

## 2020-03-16 MED ORDER — OXYCODONE-ACETAMINOPHEN 5-325 MG PO TABS: 1.0000 | ORAL_TABLET | ORAL | 0 refills | Status: DC | PRN

## 2020-03-16 MED ORDER — OXYCODONE-ACETAMINOPHEN 5-325 MG PO TABS
1.0000 | ORAL_TABLET | Freq: Once | ORAL | Status: AC
  Administered 2020-03-16: 1 via ORAL
  Filled 2020-03-16: qty 1

## 2020-03-16 NOTE — Progress Notes (Signed)
Orthopedic Tech Progress Note Patient Details:  Margaret Norman [birth year] [id number]  Ortho Devices Type of Ortho Device: Shoulder immobilizer Ortho Device/Splint Location: left Ortho Device/Splint Interventions: Application   Post Interventions Patient Tolerated: Well Instructions Provided: Care of device   Margaret Norman 03/16/2020, 1:10 PM

## 2020-03-16 NOTE — ED Notes (Signed)
Ortho tech called to apply immobilizer sling.

## 2020-03-16 NOTE — ED Provider Notes (Signed)
[facility] DEPT Provider Note   CSN: [id number] Arrival date & time: 03/16/20  1143     History Chief Complaint  Patient presents with  . Fall    Margaret Norman is a 69 y.o. female.  69yo F w/ PMH below who p/w L shoulder injury after fall.  Just prior to arrival, the patient was carrying her dog when she had a ground-level fall from tripping and landed on her left shoulder.  She states she may have bumped her head but not hard and she did not lose consciousness.  She reports severe, constant pain in her left shoulder.  No hand numbness.  She denies any other areas of pain including no other extremity injury, neck pain, back pain, or chest wall pain.  No anticoagulant use.  The history is provided by the patient.  Fall       Past Medical History:  Diagnosis Date  . Arthritis   . Chest pain    Nuclear, adenosine,  December, 2013, low risk nuclear scan with small, moderate in intensity, fixed anteroseptal defect. This is possibly related to an LBBB versus small prior infarct. No ischemia  . Depression   . Gallstones 11-06  . GERD (gastroesophageal reflux disease)   . History of kidney stones   . HTN (hypertension)   . Hyperlipidemia   . Kidney mass 01/28/2014  . LBBB (left bundle branch block)   . Nephrolithiasis   . Pneumonia   . 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 Type II or unspecified type diabetes mellitus without mention of complication, not stated as uncontrolled     Patient Active Problem List   Diagnosis Date Noted  . Port-A-Cath in place 06/10/2019  . Breast cancer ([place]) 04/15/2019  . Second degree AV block   . Malignant neoplasm of upper-outer quadrant of right breast in female, estrogen receptor positive ([place]) 03/25/2019  . Thyromegaly 01/10/2018  . Overweight (BMI 25.0-29.9) 08/13/2017  . Upper respiratory tract infection 07/30/2017  . Type 2 diabetes mellitus with hyperglycemia, with long-term current use  of insulin ([place]) 03/19/2017  . Thiamine deficiency 01/04/2017  . 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 Deficiency anemia 04/30/2016  . Routine general medical examination at a health care facility 04/27/2015  . Abdominal wall hernia 04/26/2015  . Family history of hemochromatosis 09/13/2014  . Acute maxillary sinusitis 05/14/2014  . Personal history of colonic polyps 03/10/2014  . 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 Lumbosacral spondylosis without myelopathy 05/12/2013  . Insomnia, persistent 02/04/2013  . Obesity (BMI 30-39.9) 02/04/2013  . Unspecified asthma, with exacerbation 09/22/2012  . Visit for screening mammogram 07/04/2012  . Nephrolithiasis   . HTN (hypertension)   . Gallstones   . LBBB (left bundle branch block)   . Leukocytosis 12/20/2008  . Hyperlipidemia with target LDL less than 100 09/04/2007  . Chronic depression 09/04/2007  . GERD 09/04/2007    Past Surgical History:  Procedure Laterality Date  . APPENDECTOMY    . BREAST LUMPECTOMY WITH RADIOACTIVE SEED AND SENTINEL LYMPH NODE BIOPSY Right 04/15/2019   Procedure: RIGHT BREAST PARTIAL MASTECTOMY WITH RADIOACTIVE SEED AND SENTINEL LYMPH NODE BIOPSY;  Surgeon: Coralie Keens, MD;  Location: [facility];  Service: General;  Laterality: Right;  . CESAREAN SECTION     x2 ? w/appy  . CHOLECYSTECTOMY    . ESOPHAGOGASTRODUODENOSCOPY    . HERNIA REPAIR    . KNEE ARTHROSCOPY Right 11/2005  . LIVER BIOPSY    . PANCREATIC CYST  EXCISION    . PORTACATH PLACEMENT Left 04/15/2019   Procedure: INSERTION PORT-A-CATH WITH ULTRASOUND;  Surgeon: Coralie Keens, MD;  Location: [facility];  Service: General;  Laterality: Left;  . RIGHT/LEFT HEART CATH AND CORONARY ANGIOGRAPHY N/A 09/28/2019   Procedure: RIGHT/LEFT HEART CATH AND CORONARY ANGIOGRAPHY;  Surgeon: Larey Dresser, MD;  Location: [facility];  Service: Cardiovascular;  Laterality: N/A;  . SPLENECTOMY    . TONSILLECTOMY AND ADENOIDECTOMY        OB History   No obstetric history on file.     Family History  Problem Relation Age of Onset  . Liver disease Mother   . Dementia Mother   . Diabetes Mother        borderline  . Coronary artery disease Father   . Heart attack Father   . Hypertension Father   . Heart disease Father   . Cancer Other        leukemia  . Stroke Maternal Grandfather   . Hyperlipidemia Brother        Amyloidosis  . Diabetes Maternal Grandmother   . Cancer Paternal Uncle        unknown  . Heart attack Paternal Grandmother   . Heart attack Paternal Uncle   . Hypertension Brother   . Diabetes Daughter        borderline  . Colon cancer Neg Hx     Social History   Tobacco Use  . Smoking status: Never Smoker  . Smokeless tobacco: Never Used  Vaping Use  . Vaping Use: Never used  Substance Use Topics  . Alcohol use: No  . Drug use: No    Home Medications Prior to Admission medications   Medication Sig Start Date End Date Taking? Authorizing Provider  acetaminophen (TYLENOL) 500 MG tablet Take 1,000 mg by mouth every 6 (six) hours as needed for moderate pain.    [provider]  anastrozole (ARIMIDEX) 1 MG tablet Take 1 tablet (1 mg total) by mouth daily. 09/28/19   Nicholas Lose, MD  aspirin 81 MG EC tablet Take 81 mg by mouth daily.      [provider]  b complex vitamins tablet Take 1 tablet by mouth daily at 12 noon.    [provider]  Blood Glucose Monitoring Suppl (ONETOUCH VERIO FLEX SYSTEM) W/DEVICE KIT 1 kit by Does not apply route 3 (three) times daily. 01/12/15   Janith Lima, MD  buPROPion (WELLBUTRIN XL) 150 MG 24 hr tablet Take 150 mg by mouth daily.    [provider]  carvedilol (COREG) 6.25 MG tablet Take 1.5 tablets (9.375 mg total) by mouth 2 (two) times daily. 02/15/20   Larey Dresser, MD  Cholecalciferol (VITAMIN D) 50 MCG (2000 UT) tablet Take 2,000 Units by mouth daily.    [provider]  empagliflozin (JARDIANCE)  25 MG TABS tablet Take 25 mg by mouth daily. 09/17/19   Philemon Kingdom, MD  fluticasone (FLONASE) 50 MCG/ACT nasal spray Place 2 sprays into both nostrils daily as needed for allergies or rhinitis.    [provider]  glucose blood (ONETOUCH VERIO) test strip Test three times daily as directed. DX: E11.9 12/24/16   Janith Lima, MD  Insulin Pen Needle 32G X 4 MM MISC 1 each by Other route daily. E11.9; THIS IS THE CORRECT PEN NEEDLE. PLEASE FILL RX AS ORDERED 01/19/20   Renato Shin, MD  LANTUS SOLOSTAR 100 UNIT/ML Solostar Pen INJECT SUBCUTANEOUSLY 30  UNITS  AT BEDTIME 01/14/20   Philemon Kingdom, MD  metFORMIN (GLUCOPHAGE) 1000 MG tablet Take 1,000 mg by mouth 2 (two) times daily with a meal.    [provider]  omeprazole (PRILOSEC) 40 MG capsule Take 40 mg by mouth daily.    [provider]  OneTouch Delica Lancets 10X MISC 1 each by Does not apply route daily. E11.9 01/19/20   Philemon Kingdom, MD  oxyCODONE-acetaminophen (PERCOCET) 5-325 MG tablet Take 1 tablet by mouth every 4 (four) hours as needed for up to 5 days. 03/16/20 03/21/20  Tanji Storrs, Wenda Overland, MD  Probiotic Product (PROBIOTIC ADVANCED PO) Take 1 tablet by mouth at bedtime.     [provider]  rosuvastatin (CRESTOR) 10 MG tablet Take 10 mg by mouth at bedtime.    [provider]  sacubitril-valsartan (ENTRESTO) 49-51 MG Take 1 tablet by mouth 2 (two) times daily. 01/15/20   Larey Dresser, MD  sertraline (ZOLOFT) 100 MG tablet Take 100 mg by mouth daily.    [provider]  spironolactone (ALDACTONE) 25 MG tablet Take 1 tablet (25 mg total) by mouth daily. 08/17/19   Larey Dresser, MD  TRULICITY 3 NA/3.5TD SOPN INJECT 3 MG INTO THE SKIN  WEEKLY Patient taking differently: Inject 3 mg as directed every Saturday.  07/16/19   Philemon Kingdom, MD  Vitamin E 180 MG CAPS Take 180 mg by mouth at bedtime.     [provider]    Allergies    Invokana [canagliflozin]  and Amoxicillin  Review of Systems   Review of Systems All other systems reviewed and are negative except that which was mentioned in HPI  Physical Exam Updated Vital Signs BP 135/74 (BP Location: Right Arm)   Pulse 84   Temp 98.3 F (36.8 C) (Oral)   Resp 16   Ht 5' 2"  (1.575 m)   Wt 62.6 kg   SpO2 96%   BMI 25.24 kg/m   Physical Exam Vitals and nursing note reviewed.  Constitutional:      General: She is not in acute distress.    Appearance: She is well-developed.  HENT:     Head: Normocephalic and atraumatic.  Eyes:     Conjunctiva/sclera: Conjunctivae normal.     Pupils: Pupils are equal, round, and reactive to light.  Cardiovascular:     Rate and Rhythm: Normal rate and regular rhythm.     Pulses: Normal pulses.     Heart sounds: Normal heart sounds. No murmur heard.   Pulmonary:     Effort: Pulmonary effort is normal.     Breath sounds: Normal breath sounds.  Abdominal:     General: Bowel sounds are normal. There is no distension.     Palpations: Abdomen is soft.     Tenderness: There is no abdominal tenderness.  Musculoskeletal:        General: Swelling and tenderness present.     Cervical back: Neck supple. No tenderness.     Comments: L proximal humerus/anterior shoulder pain and tenderness; 2+ radial pulse, normal grip strength; no elbow or wrist tenderness; normal ROM RUE, BLE  Skin:    General: Skin is warm and dry.     Capillary Refill: Capillary refill takes less than 2 seconds.  Neurological:     Mental Status: She is alert and oriented to person, place, and time.     Sensory: No sensory deficit.     Comments: Fluent speech  Psychiatric:  Judgment: Judgment normal.     ED Results / Procedures / Treatments   Labs (all labs ordered are listed, but only abnormal results are displayed) Labs Reviewed - No data to display  EKG None  Radiology DG Shoulder Left  Result Date: 03/16/2020 CLINICAL DATA:  Left shoulder and upper arm pain  after fall. EXAM: LEFT SHOULDER - 2+ VIEW COMPARISON:  None. FINDINGS: Acute mildly impacted fracture of the surgical neck of the humerus with extension into the greater tuberosity. No joint malalignment. Mild acromioclavicular degenerative change. The glenohumeral joint is maintained. Surrounding soft tissue swelling. Incompletely evaluated left subclavian approach Port-A-Cath. IMPRESSION: Acute mildly impacted fracture of the surgical neck of the humerus with extension into the greater tuberosity. Electronically Signed   By: Margaretha Sheffield MD   On: 03/16/2020 12:48   DG Humerus Left  Result Date: 03/16/2020 CLINICAL DATA:  Left shoulder and upper arm pain after fall. EXAM: LEFT HUMERUS - 2+ VIEW COMPARISON:  None. FINDINGS: See concurrent radiograph of the shoulder for characterization of a fracture of the surgical neck of the humerus. No additional fractures identified. IMPRESSION: See concurrent radiograph of the shoulder for characterization of a fracture of the surgical neck of the humerus. No additional fractures identified. Electronically Signed   By: Margaretha Sheffield MD   On: 03/16/2020 12:50    Procedures Procedures (including critical care time)  Medications Ordered in ED Medications  oxyCODONE-acetaminophen (PERCOCET/ROXICET) 5-325 MG per tablet 1 tablet (1 tablet Oral Given 03/16/20 1210)    ED Course  I have reviewed the triage vital signs and the nursing notes.  Pertinent imaging results that were available during my care of the patient were reviewed by me and considered in my medical decision making (see chart for details).    MDM Rules/Calculators/A&P                          Neurovascularly intact distally. No other complaints aside from L shoulder and no signs of head trauma. XR show proximal humerus fracture.  Placed patient in sling and will have her follow-up with her orthopedics clinic, [facility].  Provided pain medication to use as needed, cautioned on  side effects.  Reviewed return precautions including signs of neurovascular compromise.  She voiced understanding. Final Clinical Impression(s) / ED Diagnoses Final diagnoses:  Closed fracture of proximal end of left humerus, unspecified fracture morphology, initial encounter    Rx / DC Orders ED Discharge Orders         Ordered    oxyCODONE-acetaminophen (PERCOCET) 5-325 MG tablet  Every 4 hours PRN        03/16/20 1319           Kean Gautreau, Wenda Overland, MD 03/16/20 1427

## 2020-03-16 NOTE — ED Notes (Signed)
Pt was carrying a dog when she tripped and fell onto her left shoulder. Left deformity to left shoulder. No LOC. Pt believes she did hit her head. Pt does take ASA.   BP-120/72 HR-70 RR-16

## 2020-03-17 ENCOUNTER — Other Ambulatory Visit: Payer: Self-pay | Admitting: Family

## 2020-03-19 ENCOUNTER — Encounter: Payer: Self-pay | Admitting: Family

## 2020-03-21 ENCOUNTER — Other Ambulatory Visit: Payer: Self-pay | Admitting: Family

## 2020-03-21 MED ORDER — OXYCODONE-ACETAMINOPHEN 5-325 MG PO TABS
1.0000 | ORAL_TABLET | ORAL | 0 refills | Status: AC | PRN
Start: 2020-03-21 — End: 2020-03-26

## 2020-03-25 DIAGNOSIS — S42295A Other nondisplaced fracture of upper end of left humerus, initial encounter for closed fracture: Secondary | ICD-10-CM | POA: Diagnosis not present

## 2020-03-28 ENCOUNTER — Encounter (HOSPITAL_COMMUNITY): Payer: Self-pay | Admitting: Cardiology

## 2020-03-28 ENCOUNTER — Ambulatory Visit (HOSPITAL_BASED_OUTPATIENT_CLINIC_OR_DEPARTMENT_OTHER)
Admission: RE | Admit: 2020-03-28 | Discharge: 2020-03-28 | Disposition: A | Discharge status: Home / Self Care | Payer: Medicare Other | Source: Ambulatory Visit | Attending: Cardiology | Admitting: Cardiology

## 2020-03-28 ENCOUNTER — Other Ambulatory Visit: Payer: Self-pay

## 2020-03-28 ENCOUNTER — Ambulatory Visit (HOSPITAL_COMMUNITY)
Admission: RE | Admit: 2020-03-28 | Discharge: 2020-03-28 | Disposition: A | Discharge status: Home / Self Care | Payer: Medicare Other | Source: Ambulatory Visit | Attending: Cardiology | Admitting: Cardiology

## 2020-03-28 ENCOUNTER — Encounter: Payer: Self-pay | Admitting: *Deleted

## 2020-03-28 VITALS — BP 122/78 | HR 87 | Ht 62.0 in | Wt 137.2 lb

## 2020-03-28 DIAGNOSIS — I110 Hypertensive heart disease with heart failure: Secondary | ICD-10-CM | POA: Diagnosis not present

## 2020-03-28 DIAGNOSIS — F32A Depression, unspecified: Secondary | ICD-10-CM | POA: Diagnosis not present

## 2020-03-28 DIAGNOSIS — Z794 Long term (current) use of insulin: Secondary | ICD-10-CM | POA: Diagnosis not present

## 2020-03-28 DIAGNOSIS — E785 Hyperlipidemia, unspecified: Secondary | ICD-10-CM | POA: Diagnosis not present

## 2020-03-28 DIAGNOSIS — I428 Other cardiomyopathies: Secondary | ICD-10-CM | POA: Diagnosis not present

## 2020-03-28 DIAGNOSIS — I447 Left bundle-branch block, unspecified: Secondary | ICD-10-CM | POA: Insufficient documentation

## 2020-03-28 DIAGNOSIS — Z79899 Other long term (current) drug therapy: Secondary | ICD-10-CM | POA: Diagnosis not present

## 2020-03-28 DIAGNOSIS — I5022 Chronic systolic (congestive) heart failure: Secondary | ICD-10-CM

## 2020-03-28 DIAGNOSIS — Z7982 Long term (current) use of aspirin: Secondary | ICD-10-CM | POA: Diagnosis not present

## 2020-03-28 DIAGNOSIS — Z853 Personal history of malignant neoplasm of breast: Secondary | ICD-10-CM | POA: Diagnosis not present

## 2020-03-28 DIAGNOSIS — E119 Type 2 diabetes mellitus without complications: Secondary | ICD-10-CM | POA: Insufficient documentation

## 2020-03-28 LAB — ECHOCARDIOGRAM COMPLETE
Area-P 1/2: 4.31 cm2
S' Lateral: 3.1 cm

## 2020-03-28 MED ORDER — PERFLUTREN LIPID MICROSPHERE
1.0000 mL | INTRAVENOUS | Status: DC | PRN
  Administered 2020-03-28: 2 mL via INTRAVENOUS
  Filled 2020-03-28: qty 10

## 2020-03-28 MED ORDER — CARVEDILOL 12.5 MG PO TABS
12.5000 mg | ORAL_TABLET | Freq: Two times a day (BID) | ORAL | 5 refills | Status: DC
Start: 2020-03-28 — End: 2020-06-28

## 2020-03-28 NOTE — Patient Instructions (Signed)
INCREASE Coreg 12.5mg  (1 tab) twice a day  Your physician recommends that you schedule a follow-up appointment in: 3 months with Dr Aundra Dubin   Please call office at [phone] option 2 if you have any questions or concerns.    At the [facility], you and your health needs are our priority. As part of our continuing mission to provide you with exceptional heart care, we have created designated Provider Care Teams. These Care Teams include your primary Cardiologist (physician) and Advanced Practice Providers (APPs- Physician Assistants and Nurse Practitioners) who all work together to provide you with the care you need, when you need it.   You may see any of the following providers on your designated Care Team at your next follow up: Marland Kitchen Dr Glori Bickers . Dr Loralie Champagne . Darrick Grinder, NP . Lyda Jester, PA . Audry Riles, PharmD   Please be sure to bring in all your medications bottles to every appointment.

## 2020-03-28 NOTE — Progress Notes (Signed)
  Echocardiogram 2D Echocardiogram has been performed.  Margaret Norman 03/28/2020, 9:58 AM

## 2020-03-29 NOTE — Progress Notes (Signed)
PCP: Olive Bass, FNP Oncology: Dr. Pamelia Hoit HF cardiology: Dr. Shirlee Latch   69 y.o. with history of DM, chronic LBBB, and breast cancer was referred by Dr. Pamelia Hoit for workup of fall in EF.  She was diagnosed with right breast cancer, ER+/PR+/HER2+ in 9/20.  She had right lumpectomy in 10/20.  Starting in 11/20, she had been getting 12 cycles of Taxol/Herceptin to be followed by Herceptin alone to complete a year. Echo in 10/20 showed EF 55-60% per report (looked more like 50% on my review, likely due to septal-lateral dyssynchrony).  Her repeat echo for Herceptin screening was done in 1/21, showing EF down to 30-35% with septal-lateral dyssynchrony.  Herceptin has been stopped for now.  She is currently undergoing radiation therapy.   Echo in 3/21 showed that EF remains 30-35% with prominent septal-lateral dyssynchrony.  Patient has a strong family history of heart problems.  Father died from "ruptured aorta."  Sister had heart valve repair, one brother has a pacemaker, and another brother has AL amyloidosis.   LHC/RHC was done in 4/21. This showed normal filling pressures, preserved cardiac output, and no significant CAD. Cardiac MRI in 5/21 showed EF 36% with septal-lateral dyssynchrony, RV EF normal 45%, no LGE.  Repeat echo was done today and reviewed, EF 40-45% with septal-lateral dyssynchrony.   She returns today for followup of cardiomyopathy.  She gets fatigued easily but not short of breath walking on flat ground.  Mild dyspnea walking up stairs.  No chest pain.  No lightheadedness.  Recent left humeral fracture, tripped and fell (no lightheadedness/syncope).      Labs (1/21): K 3.8, creatinine 0.75 Labs (3/21): K 4.5, creatinine 0.71 Labs (4/21): K 4.3, creatinine 0.82 => 0.83 Labs (8/21): hgb 12.7, K 4.7, creatinine 0.85  PMH: 1. Depression 2. Type 2 diabetes 3. Chronic LBBB 4. H/o transient complete heart block: in 10/20 when she was having port-a-cath placed.  5. HTN 6.  Hyperlipidemia.  7. Breast cancer: She was diagnosed with right breast cancer, ER+/PR+/HER2+ in 9/20.  She had right lumpectomy in 10/20.  Starting in 11/20, she has been getting 12 cycles of Taxol/Herceptin to be followed by Herceptin alone to complete a year.  8. Chronic systolic CHF: She has had mildly decreased EF in the past, possibly related to LBBB.  Nonischemic cardiomyopathy.  - Echo (4/17): EF 50-55% - Cardiolite (4/17): EF 48% - Echo (10/20): EF 55-60% (reviewed, think more like 50% but technically difficult).  - Echo (1/21): EF 30-35%, mild LVH, septal dyssynchrony.  - Echo (3/21): EF 30-35% with prominent septal-lateral dyssynchrony.  - LHC/RHC (4/21): mean RA 1, PA 13/3, mean PCWP 2, CI 3.74, no significant CAD.  - Cardiac MRI (5/21): EF 36% with septal-lateral dyssynchrony, RV EF normal 45%, no LGE.  - Echo (10/21): EF 40-45%, septal-lateral dyssynchrony.   Social History   Socioeconomic History  . Marital status: Married    Spouse name: Reva Pinkley  . Number of children: 2  . Years of education: Not on file  . Highest education level: Not on file  Occupational History  . Occupation: CSR    Employer: [employer]  . Occupation: retired  Tobacco Use  . Smoking status: Never Smoker  . Smokeless tobacco: Never Used  Vaping Use  . Vaping Use: Never used  Substance and Sexual Activity  . Alcohol use: No  . Drug use: No  . Sexual activity: Not Currently  Other Topics Concern  . Not on file  Social History  Narrative   Regular Exercise -  NO   Social Determinants of Health   Financial Resource Strain: Low Risk   . Difficulty of Paying Living Expenses: Not hard at all  Food Insecurity: No Food Insecurity  . Worried About Charity fundraiser in the Last Year: Never true  . Ran Out of Food in the Last Year: Never true  Transportation Needs: No Transportation Needs  . Lack of Transportation (Medical): No  . Lack of Transportation (Non-Medical): No  Physical  Activity: Sufficiently Active  . Days of Exercise per Week: 5 days  . Minutes of Exercise per Session: 30 min  Stress: No Stress Concern Present  . Feeling of Stress : Not at all  Social Connections: Socially Integrated  . Frequency of Communication with Friends and Family: More than three times a week  . Frequency of Social Gatherings with Friends and Family: More than three times a week  . Attends Religious Services: More than 4 times per year  . Active Member of Clubs or Organizations: Yes  . Attends Archivist Meetings: More than 4 times per year  . Marital Status: Married  Human resources officer Violence: Not At Risk  . Fear of Current or Ex-Partner: No  . Emotionally Abused: No  . Physically Abused: No  . Sexually Abused: No   Family History  Problem Relation Age of Onset  . Liver disease Mother   . Dementia Mother   . Diabetes Mother        borderline  . Coronary artery disease Father   . Heart attack Father   . Hypertension Father   . Heart disease Father   . Cancer Other        leukemia  . Stroke Maternal Grandfather   . Hyperlipidemia Brother        Amyloidosis  . Diabetes Maternal Grandmother   . Cancer Paternal Uncle        unknown  . Heart attack Paternal Grandmother   . Heart attack Paternal Uncle   . Hypertension Brother   . Diabetes Daughter        borderline  . Colon cancer Neg Hx    Current Outpatient Medications  Medication Sig Dispense Refill  . acetaminophen (TYLENOL) 500 MG tablet Take 1,000 mg by mouth every 6 (six) hours as needed for moderate pain.    Marland Kitchen anastrozole (ARIMIDEX) 1 MG tablet Take 1 tablet (1 mg total) by mouth daily. 90 tablet 3  . aspirin 81 MG EC tablet Take 81 mg by mouth daily.      Marland Kitchen b complex vitamins tablet Take 1 tablet by mouth daily at 12 noon.    . Blood Glucose Monitoring Suppl (ONETOUCH VERIO FLEX SYSTEM) W/DEVICE KIT 1 kit by Does not apply route 3 (three) times daily. 1 kit 1  . buPROPion (WELLBUTRIN XL) 150 MG  24 hr tablet Take 150 mg by mouth daily.    . carvedilol (COREG) 12.5 MG tablet Take 1 tablet (12.5 mg total) by mouth 2 (two) times daily. 60 tablet 5  . Cholecalciferol (VITAMIN D) 50 MCG (2000 UT) tablet Take 2,000 Units by mouth daily.    . empagliflozin (JARDIANCE) 25 MG TABS tablet Take 25 mg by mouth daily. 90 tablet 3  . glucose blood (ONETOUCH VERIO) test strip Test three times daily as directed. DX: E11.9 300 each 3  . Insulin Pen Needle 32G X 4 MM MISC 1 each by Other route daily. E11.9; THIS IS THE CORRECT  PEN NEEDLE. PLEASE FILL RX AS ORDERED 100 each 3  . LANTUS SOLOSTAR 100 UNIT/ML Solostar Pen INJECT SUBCUTANEOUSLY 30  UNITS AT BEDTIME 30 mL 3  . metFORMIN (GLUCOPHAGE) 1000 MG tablet Take 1,000 mg by mouth 2 (two) times daily with a meal.    . omeprazole (PRILOSEC) 40 MG capsule Take 40 mg by mouth daily.    Glory Rosebush Delica Lancets 46F MISC 1 each by Does not apply route daily. E11.9 100 each 0  . oxyCODONE-acetaminophen (PERCOCET/ROXICET) 5-325 MG tablet Take 1-2 tablets by mouth every 6 (six) hours as needed for severe pain.    . Probiotic Product (PROBIOTIC ADVANCED PO) Take 1 tablet by mouth at bedtime.     . rosuvastatin (CRESTOR) 10 MG tablet TAKE 1 TABLET BY MOUTH DAILY 90 tablet 1  . sacubitril-valsartan (ENTRESTO) 49-51 MG Take 1 tablet by mouth 2 (two) times daily. 60 tablet 3  . sertraline (ZOLOFT) 100 MG tablet Take 100 mg by mouth daily.    Marland Kitchen spironolactone (ALDACTONE) 25 MG tablet Take 1 tablet (25 mg total) by mouth daily. 90 tablet 3  . TRULICITY 3 KC/1.2XN SOPN INJECT 3 MG INTO THE SKIN  WEEKLY (Patient taking differently: Inject 3 mg as directed every Saturday. ) 12 pen 2  . vitamin B-12 (CYANOCOBALAMIN) 100 MCG tablet Take 100 mcg by mouth daily.    . Vitamin E 180 MG CAPS Take 180 mg by mouth at bedtime.     . fluticasone (FLONASE) 50 MCG/ACT nasal spray Place 2 sprays into both nostrils daily as needed for allergies or rhinitis. (Patient not taking: Reported  on 03/28/2020)     No current facility-administered medications for this encounter.   BP 122/78 (BP Location: Right Arm, Patient Position: Sitting, Cuff Size: Normal)   Pulse 87   Ht _0  (1.575 m)   Wt 62.2 kg (137 lb 3.2 oz)   SpO2 97%   BMI 25.09 kg/m  General: NAD Neck: No JVD, no thyromegaly or thyroid nodule.  Lungs: Clear to auscultation bilaterally with normal respiratory effort. CV: Nondisplaced PMI.  Heart regular S1/S2, no S3/S4, no murmur.  No peripheral edema.  No carotid bruit.  Normal pedal pulses.  Abdomen: Soft, nontender, no hepatosplenomegaly, no distention.  Skin: Intact without lesions or rashes.  Neurologic: Alert and oriented x 3.  Psych: Normal affect. Extremities: No clubbing or cyanosis.  HEENT: Normal.   Assessment/Plan: 1. Chronic systolic CHF:  Nonischemic cardiomyopathy.  Echo in 1/21 showed EF down to 30-35% with septal-lateral dyssynchrony.  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  Based on the timeline, the most likely cause for fall in EF seems to be Herceptin use.  She is now off Herceptin, but echo in 3/21 showed EF still in the 30-35% range.  LHC/RHC in 4/21 showed low filling pressures, preserved cardiac output, and no significant CAD.  Cardiac MRI in 5/21 showed EF 36%, no LGE (no evidence for infiltrative disease).  Echo done today showed EF up a bit to 40-45% with dyssynchrony due to LBBB. NYHA class II symptoms.  She is not volume overloaded by exam.  - Continue Entresto 49/51 bid.  - Increase Coreg to 12.5 mg bid.  - Continue spironolactone 25 mg daily.  - Continue Jardiance.  - I think that she should continue to stay off Herceptin with EF still mild-moderately depressed unless Dr. Lindi Adie feels  like it is vital for her therapy at this time.  In that case, would have to follow echoes very closely while getting Herceptin.    - EF is now out of range for CRT-D.  2. 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  - Follow closely with use of Coreg.   3. LBBB: Chronic.  ?LBBB cardiomyopathy.  4. Breast cancer: Herceptin on hold for now.  If EF recovers close to normal range, would be reasonable to retry Herceptin with close monitoring and ongoing use of cardiomyopathy meds. However, EF still remains low.   Followup with me in 3 months.   Loralie Champagne 03/29/2020

## 2020-04-03 NOTE — Progress Notes (Signed)
Patient Care Team: [name], [place] as PCP - General (Internal Medicine) [name], [name], MD as PCP - Cardiology (Cardiology) Larey Dresser, MD as PCP - Advanced Heart Failure (Cardiology) [name], RN as Oncology Nurse Navigator [name], RN as Oncology Nurse Navigator  DIAGNOSIS:    ICD-10-CM   1. Malignant neoplasm of upper-outer quadrant of right breast in female, estrogen receptor positive ([place])  C50.411    Z17.0     SUMMARY OF ONCOLOGIC HISTORY: Oncology History  Malignant neoplasm of upper-outer quadrant of right breast in female, estrogen receptor positive ([place])  03/16/2019 Cancer Staging   Staging form: Breast, AJCC 8th Edition - Clinical stage from 03/16/2019: Stage IA (cT1c, cN0, cM0, G2, ER+, PR+, HER2+) - Signed by [name], NP on 03/25/2019   03/25/2019 Initial Diagnosis   Routine screening mammogram detected a 1.3cm mass in the right breast, 9 o'clock position, no axillary adenopathy. 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    04/15/2019 Surgery   Right lumpectomy [name]): IDC, grade 2, 1.3cm, with intermediate grade DCIS, clear margins, and 1 right axillary lymph node negative   04/15/2019 Cancer Staging   Staging form: Breast, AJCC 8th Edition - Pathologic stage from 04/15/2019: Stage IA (pT1c, pN0, cM0, G2, ER+, PR+, HER2+) - Signed by [name], NP on 04/29/2019   05/13/2019 -  Chemotherapy   Weekly Taxol and Herceptin x 12, then maintenance Herceptin every 3 weeks x 1 year   08/20/2019 -  Radiation Therapy   Adjuvant radiation   09/2019 -  Anti-estrogen oral therapy   Anastrozole     CHIEF COMPLIANT: Follow-up of breast cancer on anastrozole   INTERVAL HISTORY: Margaret Norman is a 69 y.o. with above-mentioned history of breast cancerwho underwent alumpectomy, radiation, and is currently on antiestrogen therapy with anastrozole. She presents  to the clinic today for follow-up.  Patient had fallen recently on her front door step and fractured her left humerus.  She does not need any major surgery but she is immobilized with a cast.  ALLERGIES:  is allergic to invokana [canagliflozin] and amoxicillin.  MEDICATIONS:  Current Outpatient Medications  Medication Sig Dispense Refill  . acetaminophen (TYLENOL) 500 MG tablet Take 1,000 mg by mouth every 6 (six) hours as needed for moderate pain.    Marland Kitchen anastrozole (ARIMIDEX) 1 MG tablet Take 1 tablet (1 mg total) by mouth daily. 90 tablet 3  . aspirin 81 MG EC tablet Take 81 mg by mouth daily.      Marland Kitchen b complex vitamins tablet Take 1 tablet by mouth daily at 12 noon.    . Blood Glucose Monitoring Suppl (ONETOUCH VERIO FLEX SYSTEM) W/DEVICE KIT 1 kit by Does not apply route 3 (three) times daily. 1 kit 1  . buPROPion (WELLBUTRIN XL) 150 MG 24 hr tablet Take 150 mg by mouth daily.    . carvedilol (COREG) 12.5 MG tablet Take 1 tablet (12.5 mg total) by mouth 2 (two) times daily. 60 tablet 5  . Cholecalciferol (VITAMIN D) 50 MCG (2000 UT) tablet Take 2,000 Units by mouth daily.    . empagliflozin (JARDIANCE) 25 MG TABS tablet Take 25 mg by mouth daily. 90 tablet 3  . fluticasone (FLONASE) 50 MCG/ACT nasal spray Place 2 sprays into both nostrils daily as needed for allergies or rhinitis. (Patient not taking: Reported on 03/28/2020)    . glucose blood (ONETOUCH VERIO) test  strip Test three times daily as directed. DX: E11.9 300 each 3  . Insulin Pen Needle 32G X 4 MM MISC 1 each by Other route daily. E11.9; THIS IS THE CORRECT PEN NEEDLE. PLEASE FILL RX AS ORDERED 100 each 3  . LANTUS SOLOSTAR 100 UNIT/ML Solostar Pen INJECT SUBCUTANEOUSLY 30  UNITS AT BEDTIME 30 mL 3  . metFORMIN (GLUCOPHAGE) 1000 MG tablet Take 1,000 mg by mouth 2 (two) times daily with a meal.    . omeprazole (PRILOSEC) 40 MG capsule Take 40 mg by mouth daily.    Glory Rosebush Delica Lancets 65L MISC 1 each by Does not apply route  daily. E11.9 100 each 0  . oxyCODONE-acetaminophen (PERCOCET/ROXICET) 5-325 MG tablet Take 1-2 tablets by mouth every 6 (six) hours as needed for severe pain.    . Probiotic Product (PROBIOTIC ADVANCED PO) Take 1 tablet by mouth at bedtime.     . rosuvastatin (CRESTOR) 10 MG tablet TAKE 1 TABLET BY MOUTH DAILY 90 tablet 1  . sacubitril-valsartan (ENTRESTO) 49-51 MG Take 1 tablet by mouth 2 (two) times daily. 60 tablet 3  . sertraline (ZOLOFT) 100 MG tablet Take 100 mg by mouth daily.    Marland Kitchen spironolactone (ALDACTONE) 25 MG tablet Take 1 tablet (25 mg total) by mouth daily. 90 tablet 3  . TRULICITY 3 DJ/5.7SV SOPN INJECT 3 MG INTO THE SKIN  WEEKLY (Patient taking differently: Inject 3 mg as directed every Saturday. ) 12 pen 2  . vitamin B-12 (CYANOCOBALAMIN) 100 MCG tablet Take 100 mcg by mouth daily.    . Vitamin E 180 MG CAPS Take 180 mg by mouth at bedtime.      No current facility-administered medications for this visit.    PHYSICAL EXAMINATION: ECOG PERFORMANCE STATUS: 1 - Symptomatic but completely ambulatory  There were no vitals filed for this visit. There were no vitals filed for this visit.  BREAST: No palpable masses or nodules in either right or left breasts. No palpable axillary supraclavicular or infraclavicular adenopathy no breast tenderness or nipple discharge. (exam performed in the presence of a chaperone)  LABORATORY DATA:  I have reviewed the data as listed CMP Latest Ref Rng & Units 02/17/2020 02/01/2020 01/15/2020  Glucose 65 - 99 mg/dL 103(H) 116(H) 93  BUN 7 - 25 mg/dL _0 Creatinine 0.50 - 0.99 mg/dL 0.85 0.82 0.86  Sodium 135 - 146 mmol/L 143 143 143  Potassium 3.5 - 5.3 mmol/L 4.7 4.7 4.6  Chloride 98 - 110 mmol/L 107 105 106  CO2 20 - 32 mmol/L _1 Calcium 8.6 - 10.4 mg/dL 10.2 9.5 9.7  Total Protein 6.1 - 8.1 g/dL 6.5 - -  Total Bilirubin 0.2 - 1.2 mg/dL 0.3 - -  Alkaline Phos 38 - 126 U/L - - -  AST 10 - 35 U/L 11 - -  ALT 6 - 29 U/L 9 - -     Lab Results  Component Value Date   WBC 14.6 (H) 02/17/2020   HGB 12.7 02/17/2020   HCT 39.0 02/17/2020   MCV 90.7 02/17/2020   PLT 485 (H) 02/17/2020   NEUTROABS 8,979 (H) 02/17/2020    ASSESSMENT & PLAN:  Malignant neoplasm of upper-outer quadrant of right breast in female, estrogen receptor positive ([place]) Routine screening mammogram detected a 1.3cm mass in the right breast, 9 o'clock position, no axillary adenopathy. 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 T1c N0 stage  Ia  Recommendations: 1. 04/15/19: Breast conserving surgery: Right lumpectomy [name]): IDC, grade 2, 1.3cm, with intermediate grade DCIS, clear margins, and 1 right axillary lymph node negative 2.Adj chemo with Taxol-Herceptin foll by Herceptin maintenance for 1 yearstarted 05/13/2019 3. Adjuvant radiation therapy 08/20/2019-09/14/2019 4. Adjuvant antiestrogen therapy with anastrozole 1 mg daily  Enrolled inSWOG 1714 trials.  ------------------------------------------------------------------------------------------------------------------------- Current treatment: Herceptin maintenance (awaiting repeat echo results) EchoEF 55 to 60%04/15/2019 EF 30 to 35% on 07/20/2019  As we are waiting for the cardiac ejection fraction to improve, we will start her on antiestrogen therapy with anastrozole 1 mg daily which will be started April 2021.  Anastrozole toxicities: Denies any hot flashes or arthralgias or myalgias.  Right humerus hairline fracture: After a fall on 03/17/2020.  Immobilized  Patient is participating in antiestrogen therapy compliance study. Herceptin could not be resumed because her left ventricular ejection fraction is still at 40 to 45% (improved from 30 to 35%)  Based on these findings I recommended no further Herceptin. She follows with cardiology for ongoing evaluation and management of the cardiomyopathy. Return to clinic in 1 year  for follow-up   No orders of the defined types were placed in this encounter.  The patient has a good understanding of the overall plan. she agrees with it. she will call with any problems that may develop before the next visit here.  Total time spent: 20 mins including face to face time and time spent for planning, charting and coordination of care  Nicholas Lose, MD 04/04/2020  I, Cloyde Reams Dorshimer, am acting as scribe for Dr. Nicholas Lose.  I have reviewed the above documentation for accuracy and completeness, and I agree with the above.

## 2020-04-04 ENCOUNTER — Encounter: Payer: Self-pay | Admitting: *Deleted

## 2020-04-04 ENCOUNTER — Other Ambulatory Visit: Payer: Self-pay | Admitting: Family

## 2020-04-04 ENCOUNTER — Telehealth: Payer: Self-pay | Admitting: Hematology and Oncology

## 2020-04-04 ENCOUNTER — Inpatient Hospital Stay: Payer: Medicare Other | Attending: Hematology and Oncology | Admitting: Hematology and Oncology

## 2020-04-04 ENCOUNTER — Other Ambulatory Visit: Payer: Self-pay | Admitting: Surgery

## 2020-04-04 ENCOUNTER — Other Ambulatory Visit: Payer: Self-pay

## 2020-04-04 DIAGNOSIS — Z1501 Genetic susceptibility to malignant neoplasm of breast: Secondary | ICD-10-CM | POA: Insufficient documentation

## 2020-04-04 DIAGNOSIS — Z7982 Long term (current) use of aspirin: Secondary | ICD-10-CM | POA: Diagnosis not present

## 2020-04-04 DIAGNOSIS — Z79811 Long term (current) use of aromatase inhibitors: Secondary | ICD-10-CM | POA: Diagnosis not present

## 2020-04-04 DIAGNOSIS — Z170 Estrogen receptor positive status [ER+]: Secondary | ICD-10-CM | POA: Diagnosis not present

## 2020-04-04 DIAGNOSIS — C50411 Malignant neoplasm of upper-outer quadrant of right female breast: Secondary | ICD-10-CM | POA: Diagnosis not present

## 2020-04-04 DIAGNOSIS — Z79899 Other long term (current) drug therapy: Secondary | ICD-10-CM | POA: Insufficient documentation

## 2020-04-04 NOTE — Assessment & Plan Note (Signed)
Routine screening mammogram detected a 1.3cm mass in the right breast, 9 o'clock position, no axillary adenopathy. 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 T1c N0 stage Ia  Recommendations: 1. 04/15/19: Breast conserving surgery: Right lumpectomy [name]): IDC, grade 2, 1.3cm, with intermediate grade DCIS, clear margins, and 1 right axillary lymph node negative 2.Adj chemo with Taxol-Herceptin foll by Herceptin maintenance for 1 yearstarted 05/13/2019 3. Adjuvant radiation therapy 08/20/2019-09/14/2019 4. Adjuvant antiestrogen therapy with anastrozole 1 mg daily  Enrolled inSWOG 1714 trials.  ------------------------------------------------------------------------------------------------------------------------- Current treatment: Herceptin maintenance (awaiting repeat echo results) EchoEF 55 to 60%04/15/2019 EF 30 to 35% on 07/20/2019  As we are waiting for the cardiac ejection fraction to improve, we will start her on antiestrogen therapy with anastrozole 1 mg daily which will be started April 2021.  Anastrozole toxicities:  Patient is participating in antiestrogen therapy compliance study. Herceptin could not be resumed because her left ventricular ejection fraction is still at 40 to 45% (improved from 30 to 35%)  Based on these findings I recommended no further Herceptin. She follows with cardiology for ongoing evaluation and management of the cardiomyopathy.

## 2020-04-04 NOTE — Telephone Encounter (Signed)
Scheduled appointment per 10/6 LOS called and left patient message

## 2020-04-07 ENCOUNTER — Other Ambulatory Visit: Payer: Self-pay | Admitting: *Deleted

## 2020-04-07 DIAGNOSIS — Z170 Estrogen receptor positive status [ER+]: Secondary | ICD-10-CM

## 2020-04-07 DIAGNOSIS — C50411 Malignant neoplasm of upper-outer quadrant of right female breast: Secondary | ICD-10-CM

## 2020-04-14 ENCOUNTER — Telehealth: Payer: Self-pay | Admitting: *Deleted

## 2020-04-14 ENCOUNTER — Other Ambulatory Visit: Payer: Self-pay

## 2020-04-14 ENCOUNTER — Encounter (HOSPITAL_BASED_OUTPATIENT_CLINIC_OR_DEPARTMENT_OTHER): Payer: Self-pay | Admitting: Surgery

## 2020-04-14 NOTE — Telephone Encounter (Signed)
   [facility] Pre-operative Risk Assessment    HEARTCARE STAFF: - Please ensure there is not already an duplicate clearance open for this procedure. - Under Visit Info/Reason for Call, type in Other and utilize the format Clearance MM/DD/YY or Clearance TBD. Do not use dashes or single digits. - If request is for dental extraction, please clarify the # of teeth to be extracted.  Request for surgical clearance:  1. What type of surgery is being performed? PORT A CATH REMOVAL    2. When is this surgery scheduled? 04/25/20  3. What type of clearance is required (medical clearance vs. Pharmacy clearance to hold med vs. Both)? MEDICAL  4. Are there any medications that need to be held prior to surgery and how long? ASA    5. Practice name and name of physician performing surgery? [facility]; DR. Coralie Keens   6. What is the office phone number? [phone]   7.   What is the office fax number? [place]: Malachi Bonds, CMA  8.   Anesthesia type (None, local, MAC, general) ? GENERAL   Julaine Hua 04/14/2020, 4:34 PM  _________________________________________________________________   (provider comments below)

## 2020-04-15 NOTE — Telephone Encounter (Signed)
   Primary Cardiologist: Mertie Moores, MD  Chart reviewed as part of pre-operative protocol coverage. Patient scheduled to have port-a-cath removed on 04/25/2020. Patient recently seen by Dr. Aundra Dubin on 03/28/2020 and was doing well from a cardiac standpoint. She did not appear volume overloaded at that time. Echo at that visit showed slightly improved EF to 40-45% with global hypokinesis and grade 1 diastolic dysfunction. Patient was called today for further pre-op evaluation. She reports she has been doing well since last visit. No chest pain. No significant dyspnea on exertion even when walking up inclines (although she does state she goes slow). No acute CHF symptoms. Given past medical history and time since last visit, based on ACC/AHA guidelines, Margaret Norman would be at acceptable risk for the planned procedure without further cardiovascular testing.   OK to hold Aspirin as needed prior to procedure. Please restart this as soon as able postoperatively.   Of note, patient had transient complete heart block after port-a-cath placement. Therefore, recommend close monitoring on telemetry perioperatively.   I will route this recommendation to the requesting party via Epic fax function and remove from pre-op pool.  Please call with questions.  Darreld Mclean, PA-C 04/15/2020, 1:54 PM

## 2020-04-18 ENCOUNTER — Encounter: Payer: Self-pay | Admitting: Family

## 2020-04-20 ENCOUNTER — Encounter (HOSPITAL_BASED_OUTPATIENT_CLINIC_OR_DEPARTMENT_OTHER)
Admission: RE | Admit: 2020-04-20 | Discharge: 2020-04-20 | Disposition: A | Discharge status: Home / Self Care | Payer: Medicare Other | Source: Ambulatory Visit | Attending: Surgery | Admitting: Surgery

## 2020-04-20 DIAGNOSIS — Z01818 Encounter for other preprocedural examination: Secondary | ICD-10-CM | POA: Diagnosis not present

## 2020-04-20 DIAGNOSIS — Z9889 Other specified postprocedural states: Secondary | ICD-10-CM | POA: Diagnosis not present

## 2020-04-20 DIAGNOSIS — Z20822 Contact with and (suspected) exposure to covid-19: Secondary | ICD-10-CM | POA: Insufficient documentation

## 2020-04-20 LAB — BASIC METABOLIC PANEL
Anion gap: 11 (ref 5–15)
BUN: 24 mg/dL — ABNORMAL HIGH (ref 8–23)
CO2: 24 mmol/L (ref 22–32)
Calcium: 10.1 mg/dL (ref 8.9–10.3)
Chloride: 103 mmol/L (ref 98–111)
Creatinine, Ser: 0.92 mg/dL (ref 0.44–1.00)
GFR, Estimated: >60 mL/min (ref >60)
Glucose, Bld: 221 mg/dL — ABNORMAL HIGH (ref 70–99)
Potassium: 4.2 mmol/L (ref 3.5–5.1)
Sodium: 138 mmol/L (ref 135–145)

## 2020-04-20 MED ORDER — ENSURE PRE-SURGERY PO LIQD: 296.0000 mL | Freq: Once | ORAL | Status: DC

## 2020-04-20 NOTE — Progress Notes (Signed)
      Enhanced Recovery after Surgery for Orthopedics Enhanced Recovery after Surgery is a protocol used to improve the stress on your body and your recovery after surgery.  Patient Instructions  . The night before surgery:  o No food after midnight. ONLY clear liquids after midnight  . The day of surgery (if you do NOT have diabetes):  o Drink ONE (1) Pre-Surgery Clear Ensure as directed.   o This drink was given to you during your hospital  pre-op appointment visit. o The pre-op nurse will instruct you on the time to drink the  Pre-Surgery Ensure depending on your surgery time. o Finish the drink at the designated time by the pre-op nurse.  o Nothing else to drink after completing the  Pre-Surgery Clear Ensure.  . The day of surgery (if you have diabetes): o Drink ONE (1) Gatorade 2 (G2) as directed. o This drink was given to you during your hospital  pre-op appointment visit.  o The pre-op nurse will instruct you on the time to drink the   Gatorade 2 (G2) depending on your surgery time. o Color of the Gatorade may vary. Red is not allowed. o Nothing else to drink after completing the  Gatorade 2 (G2).         If you have questions, please contact your surgeon's office.  Patient was given soap and instructions, verbalized understanding.

## 2020-04-21 ENCOUNTER — Other Ambulatory Visit (HOSPITAL_COMMUNITY)
Admission: RE | Admit: 2020-04-21 | Discharge: 2020-04-21 | Disposition: A | Discharge status: Home / Self Care | Payer: Medicare Other | Source: Ambulatory Visit | Attending: Surgery | Admitting: Surgery

## 2020-04-21 DIAGNOSIS — Z01812 Encounter for preprocedural laboratory examination: Secondary | ICD-10-CM | POA: Diagnosis not present

## 2020-04-21 DIAGNOSIS — Z20822 Contact with and (suspected) exposure to covid-19: Secondary | ICD-10-CM | POA: Diagnosis not present

## 2020-04-21 LAB — SARS CORONAVIRUS 2 (TAT 6-24 HRS): SARS Coronavirus 2: NEGATIVE

## 2020-04-21 NOTE — Progress Notes (Signed)
Chart and cardiac notes reviewed with Dr Fransisco Beau, [place] for [facility].

## 2020-04-24 NOTE — H&P (Signed)
Margaret Norman is an 70 y.o. female.   Chief Complaint: port no longer needed HPI: this is a 69 year old female with a history of breast cancer. She has completed therapy and her port a cath is no longer needed.  She is doing well and is without complaints  Past Medical History:  Diagnosis Date  . Arthritis   . Chest pain    Nuclear, adenosine,  December, 2013, low risk nuclear scan with small, moderate in intensity, fixed anteroseptal defect. This is possibly related to an LBBB versus small prior infarct. No ischemia  . Depression   . Gallstones 11-06  . GERD (gastroesophageal reflux disease)   . History of kidney stones   . HTN (hypertension)   . Hyperlipidemia   . Kidney mass 01/28/2014  . LBBB (left bundle branch block)   . Nephrolithiasis   . Pneumonia   . 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 Type II or unspecified type diabetes mellitus without mention of complication, not stated as uncontrolled     Past Surgical History:  Procedure Laterality Date  . APPENDECTOMY    . BREAST LUMPECTOMY WITH RADIOACTIVE SEED AND SENTINEL LYMPH NODE BIOPSY Right 04/15/2019   Procedure: RIGHT BREAST PARTIAL MASTECTOMY WITH RADIOACTIVE SEED AND SENTINEL LYMPH NODE BIOPSY;  Surgeon: Coralie Keens, MD;  Location: [facility];  Service: General;  Laterality: Right;  . CESAREAN SECTION     x2 ? w/appy  . CHOLECYSTECTOMY    . ESOPHAGOGASTRODUODENOSCOPY    . HERNIA REPAIR    . KNEE ARTHROSCOPY Right 11/2005  . LIVER BIOPSY    . PANCREATIC CYST EXCISION    . PORTACATH PLACEMENT Left 04/15/2019   Procedure: INSERTION PORT-A-CATH WITH ULTRASOUND;  Surgeon: Coralie Keens, MD;  Location: [facility];  Service: General;  Laterality: Left;  . RIGHT/LEFT HEART CATH AND CORONARY ANGIOGRAPHY N/A 09/28/2019   Procedure: RIGHT/LEFT HEART CATH AND CORONARY ANGIOGRAPHY;  Surgeon: Larey Dresser, MD;  Location: [facility];  Service: Cardiovascular;  Laterality: N/A;  . SPLENECTOMY    .  TONSILLECTOMY AND ADENOIDECTOMY      Family History  Problem Relation Age of Onset  . Liver disease Mother   . Dementia Mother   . Diabetes Mother        borderline  . Coronary artery disease Father   . Heart attack Father   . Hypertension Father   . Heart disease Father   . Cancer Other        leukemia  . Stroke Maternal Grandfather   . Hyperlipidemia Brother        Amyloidosis  . Diabetes Maternal Grandmother   . Cancer Paternal Uncle        unknown  . Heart attack Paternal Grandmother   . Heart attack Paternal Uncle   . Hypertension Brother   . Diabetes Daughter        borderline  . Colon cancer Neg Hx    Social History:  reports that she has never smoked. She has never used smokeless tobacco. She reports that she does not drink alcohol and does not use drugs.  Allergies:  Allergies  Allergen Reactions  . Invokana [Canagliflozin] Other (See Comments)    UTI's  . Amoxicillin Other (See Comments)    Sore tongue Did it involve swelling of the face/tongue/throat, SOB, or low BP? No Did it involve sudden or severe rash/hives, skin peeling, or any reaction on the inside of your mouth or nose? No Did you need to  seek medical attention at a hospital or doctor's office? No When did it last happen?5-10 years ago If all above answers are "NO", may proceed with cephalosporin use.     No medications prior to admission.    No results found for this or any previous visit (from the past 48 hour(s)). No results found.  Review of Systems  All other systems reviewed and are negative.   Height 5\' 2"  (1.575 m), weight 62.6 kg. Physical Exam Constitutional:      General: She is not in acute distress.    Appearance: Normal appearance. She is not ill-appearing.  Cardiovascular:     Rate and Rhythm: Normal rate and regular rhythm.     Heart sounds: Normal heart sounds.  Pulmonary:     Effort: Pulmonary effort is normal.     Breath sounds: Normal breath sounds.   Abdominal:     General: Abdomen is flat.     Tenderness: There is no abdominal tenderness.  Musculoskeletal:        General: Normal range of motion.     Cervical back: Normal range of motion.  Skin:    General: Skin is warm and dry.  Neurological:     Mental Status: She is alert.  Psychiatric:        Mood and Affect: Mood normal.      Assessment/Plan History of breast cancer, port a cath no longer needed.  We will proceed with port a cath removal. The risks were discussed. She agrees to proceed.  Coralie Keens, MD 04/24/2020, 11:40 AM

## 2020-04-25 ENCOUNTER — Ambulatory Visit (HOSPITAL_BASED_OUTPATIENT_CLINIC_OR_DEPARTMENT_OTHER)
Admission: RE | Admit: 2020-04-25 | Discharge: 2020-04-25 | Disposition: A | Discharge status: Home / Self Care | Payer: Medicare Other | Attending: Surgery | Admitting: Surgery

## 2020-04-25 ENCOUNTER — Ambulatory Visit (HOSPITAL_BASED_OUTPATIENT_CLINIC_OR_DEPARTMENT_OTHER): Payer: Medicare Other | Admitting: Anesthesiology

## 2020-04-25 ENCOUNTER — Other Ambulatory Visit: Payer: Self-pay

## 2020-04-25 ENCOUNTER — Encounter (HOSPITAL_BASED_OUTPATIENT_CLINIC_OR_DEPARTMENT_OTHER)
Admission: RE | Disposition: A | Discharge status: Home / Self Care | Payer: Self-pay | Source: Home / Self Care | Attending: Surgery

## 2020-04-25 ENCOUNTER — Encounter (HOSPITAL_BASED_OUTPATIENT_CLINIC_OR_DEPARTMENT_OTHER): Payer: Self-pay | Admitting: Surgery

## 2020-04-25 DIAGNOSIS — I447 Left bundle-branch block, unspecified: Secondary | ICD-10-CM | POA: Diagnosis not present

## 2020-04-25 DIAGNOSIS — Z452 Encounter for adjustment and management of vascular access device: Secondary | ICD-10-CM | POA: Diagnosis not present

## 2020-04-25 DIAGNOSIS — Z853 Personal history of malignant neoplasm of breast: Secondary | ICD-10-CM | POA: Insufficient documentation

## 2020-04-25 DIAGNOSIS — I10 Essential (primary) hypertension: Secondary | ICD-10-CM | POA: Diagnosis not present

## 2020-04-25 HISTORY — PX: PORT-A-CATH REMOVAL: SHX5289

## 2020-04-25 LAB — GLUCOSE, CAPILLARY
Glucose-Capillary: 128 mg/dL — ABNORMAL HIGH (ref 70–99)
Glucose-Capillary: 136 mg/dL — ABNORMAL HIGH (ref 70–99)

## 2020-04-25 SURGERY — REMOVAL PORT-A-CATH
Anesthesia: Monitor Anesthesia Care | Site: Chest | Laterality: Left

## 2020-04-25 MED ORDER — ONDANSETRON HCL 4 MG/2ML IJ SOLN
INTRAMUSCULAR | Status: DC | PRN
  Administered 2020-04-25: 4 mg via INTRAVENOUS

## 2020-04-25 MED ORDER — PROPOFOL 500 MG/50ML IV EMUL
INTRAVENOUS | Status: DC | PRN
  Administered 2020-04-25: 75 ug/kg/min via INTRAVENOUS

## 2020-04-25 MED ORDER — MIDAZOLAM HCL 2 MG/2ML IJ SOLN
INTRAMUSCULAR | Status: AC
  Filled 2020-04-25: qty 2

## 2020-04-25 MED ORDER — MIDAZOLAM HCL 2 MG/2ML IJ SOLN
INTRAMUSCULAR | Status: DC | PRN
  Administered 2020-04-25: 1 mg via INTRAVENOUS

## 2020-04-25 MED ORDER — SODIUM BICARBONATE 4.2 % IV SOLN
INTRAVENOUS | Status: AC
  Filled 2020-04-25: qty 30

## 2020-04-25 MED ORDER — LACTATED RINGERS IV SOLN: INTRAVENOUS | Status: DC

## 2020-04-25 MED ORDER — CHLORHEXIDINE GLUCONATE CLOTH 2 % EX PADS: 6.0000 | MEDICATED_PAD | Freq: Once | CUTANEOUS | Status: DC

## 2020-04-25 MED ORDER — FENTANYL CITRATE (PF) 100 MCG/2ML IJ SOLN
INTRAMUSCULAR | Status: DC | PRN
  Administered 2020-04-25: 50 ug via INTRAVENOUS

## 2020-04-25 MED ORDER — LIDOCAINE HCL (PF) 1 % IJ SOLN
INTRAMUSCULAR | Status: DC | PRN
  Administered 2020-04-25: 9 mL

## 2020-04-25 MED ORDER — OXYCODONE HCL 5 MG PO TABS: 5.0000 mg | ORAL_TABLET | Freq: Once | ORAL | Status: DC | PRN

## 2020-04-25 MED ORDER — OXYCODONE HCL 5 MG/5ML PO SOLN: 5.0000 mg | Freq: Once | ORAL | Status: DC | PRN

## 2020-04-25 MED ORDER — TRAMADOL HCL 50 MG PO TABS: 50.0000 mg | ORAL_TABLET | Freq: Four times a day (QID) | ORAL | 0 refills | Status: DC | PRN

## 2020-04-25 MED ORDER — LIDOCAINE HCL (PF) 1 % IJ SOLN
INTRAMUSCULAR | Status: AC
  Filled 2020-04-25: qty 180

## 2020-04-25 MED ORDER — ACETAMINOPHEN 500 MG PO TABS
ORAL_TABLET | ORAL | Status: AC
  Filled 2020-04-25: qty 2

## 2020-04-25 MED ORDER — CIPROFLOXACIN IN D5W 400 MG/200ML IV SOLN
400.0000 mg | INTRAVENOUS | Status: AC
  Administered 2020-04-25: 400 mg via INTRAVENOUS

## 2020-04-25 MED ORDER — ACETAMINOPHEN 500 MG PO TABS
1000.0000 mg | ORAL_TABLET | ORAL | Status: AC
  Administered 2020-04-25: 1000 mg via ORAL

## 2020-04-25 MED ORDER — FENTANYL CITRATE (PF) 100 MCG/2ML IJ SOLN
INTRAMUSCULAR | Status: AC
  Filled 2020-04-25: qty 2

## 2020-04-25 MED ORDER — HYDROMORPHONE HCL 1 MG/ML IJ SOLN: 0.2500 mg | INTRAMUSCULAR | Status: DC | PRN

## 2020-04-25 MED ORDER — ONDANSETRON HCL 4 MG/2ML IJ SOLN: 4.0000 mg | Freq: Once | INTRAMUSCULAR | Status: DC | PRN

## 2020-04-25 MED ORDER — CIPROFLOXACIN IN D5W 400 MG/200ML IV SOLN
INTRAVENOUS | Status: AC
  Filled 2020-04-25: qty 200

## 2020-04-25 MED ORDER — LIDOCAINE-EPINEPHRINE (PF) 1 %-1:200000 IJ SOLN
INTRAMUSCULAR | Status: AC
  Filled 2020-04-25: qty 30

## 2020-04-25 MED ORDER — BUPIVACAINE-EPINEPHRINE (PF) 0.5% -1:200000 IJ SOLN
INTRAMUSCULAR | Status: AC
  Filled 2020-04-25: qty 150

## 2020-04-25 SURGICAL SUPPLY — 30 items
ADH SKN CLS APL DERMABOND .7 (GAUZE/BANDAGES/DRESSINGS) ×1
APL PRP STRL LF DISP 70% ISPRP (MISCELLANEOUS) ×1
BLADE SURG 15 STRL LF DISP TIS (BLADE) ×1 IMPLANT
BLADE SURG 15 STRL SS (BLADE) ×3
CHLORAPREP W/TINT 26 (MISCELLANEOUS) ×3 IMPLANT
COVER BACK TABLE 60X90IN (DRAPES) ×3 IMPLANT
COVER MAYO STAND STRL (DRAPES) ×3 IMPLANT
DERMABOND ADVANCED (GAUZE/BANDAGES/DRESSINGS) ×2
DERMABOND ADVANCED .7 DNX12 (GAUZE/BANDAGES/DRESSINGS) ×1 IMPLANT
DRAPE LAPAROTOMY 100X72 PEDS (DRAPES) ×3 IMPLANT
DRAPE UTILITY XL STRL (DRAPES) ×3 IMPLANT
ELECT REM PT RETURN 9FT ADLT (ELECTROSURGICAL) ×3
ELECTRODE REM PT RTRN 9FT ADLT (ELECTROSURGICAL) ×1 IMPLANT
GLOVE BIO SURGEON STRL SZ 6.5 (GLOVE) ×1 IMPLANT
GLOVE BIO SURGEONS STRL SZ 6.5 (GLOVE) ×1
GLOVE SURG SIGNA 7.5 PF LTX (GLOVE) ×3 IMPLANT
GOWN STRL REUS W/ TWL LRG LVL3 (GOWN DISPOSABLE) ×1 IMPLANT
GOWN STRL REUS W/ TWL XL LVL3 (GOWN DISPOSABLE) ×1 IMPLANT
GOWN STRL REUS W/TWL LRG LVL3 (GOWN DISPOSABLE) ×3
GOWN STRL REUS W/TWL XL LVL3 (GOWN DISPOSABLE) ×3
NDL HYPO 25X1 1.5 SAFETY (NEEDLE) ×1 IMPLANT
NEEDLE HYPO 25X1 1.5 SAFETY (NEEDLE) ×3 IMPLANT
PACK BASIN DAY SURGERY FS (CUSTOM PROCEDURE TRAY) ×3 IMPLANT
PENCIL SMOKE EVACUATOR (MISCELLANEOUS) ×3 IMPLANT
SUT MNCRL AB 4-0 PS2 18 (SUTURE) ×3 IMPLANT
SUT VIC AB 3-0 SH 27 (SUTURE) ×3
SUT VIC AB 3-0 SH 27X BRD (SUTURE) ×1 IMPLANT
SYR BULB EAR ULCER 3OZ GRN STR (SYRINGE) IMPLANT
SYR CONTROL 10ML LL (SYRINGE) ×3 IMPLANT
TOWEL GREEN STERILE FF (TOWEL DISPOSABLE) ×3 IMPLANT

## 2020-04-25 NOTE — Anesthesia Postprocedure Evaluation (Signed)
Anesthesia Post Note  Patient: Margaret Norman  Procedure(s) Performed: REMOVAL PORT-A-CATH (Left Chest)     Patient location during evaluation: PACU Anesthesia Type: MAC Level of consciousness: awake and alert Pain management: pain level controlled Vital Signs Assessment: post-procedure vital signs reviewed and stable Respiratory status: spontaneous breathing, nonlabored ventilation, respiratory function stable and patient connected to nasal cannula oxygen Cardiovascular status: stable and blood pressure returned to baseline Postop Assessment: no apparent nausea or vomiting Anesthetic complications: no   No complications documented.  Last Vitals:  Vitals:   04/25/20 1225 04/25/20 1236  BP: (!) 107/57 115/60  Pulse: 73 74  Resp: 15 17  Temp:  36.7 C  SpO2: 97% 98%    Last Pain:  Vitals:   04/25/20 1236  TempSrc:   PainSc: 0-No pain                 Belenda Cruise P Kiela Shisler

## 2020-04-25 NOTE — Discharge Instructions (Signed)
Ok to shower starting tomorrow  Ice pack, Tylenol, and ibuprofen also for pain  No vigorous activity for 1 week  May take Tylenol after 4:50pm, if needed.   Post Anesthesia Home Care Instructions  Activity: Get plenty of rest for the remainder of the day. A responsible individual must stay with you for 24 hours following the procedure.  For the next 24 hours, DO NOT: -Drive a car -Paediatric nurse -Drink alcoholic beverages -Take any medication unless instructed by your physician -Make any legal decisions or sign important papers.  Meals: Start with liquid foods such as gelatin or soup. Progress to regular foods as tolerated. Avoid greasy, spicy, heavy foods. If nausea and/or vomiting occur, drink only clear liquids until the nausea and/or vomiting subsides. Call your physician if vomiting continues.  Special Instructions/Symptoms: Your throat may feel dry or sore from the anesthesia or the breathing tube placed in your throat during surgery. If this causes discomfort, gargle with warm salt water. The discomfort should disappear within 24 hours.  If you had a scopolamine patch placed behind your ear for the management of post- operative nausea and/or vomiting:  1. The medication in the patch is effective for 72 hours, after which it should be removed.  Wrap patch in a tissue and discard in the trash. Wash hands thoroughly with soap and water. 2. You may remove the patch earlier than 72 hours if you experience unpleasant side effects which may include dry mouth, dizziness or visual disturbances. 3. Avoid touching the patch. Wash your hands with soap and water after contact with the patch.

## 2020-04-25 NOTE — Op Note (Signed)
REMOVAL PORT-A-CATH  Procedure Note  BRITT THEARD 04/25/2020   Pre-op Diagnosis: hx or breast cancer, port no longer needed     Post-op Diagnosis: same  Procedure(s): REMOVAL PORT-A-CATH  Surgeon(s): Coralie Keens, MD  Anesthesia: Monitor Anesthesia Care  Staff:  Circulator: Izora Ribas, RN Scrub Person: Carson Myrtle, RN  Estimated Blood Loss: Minimal  Procedure: The patient was brought to the operating room identifies correct patient.  She is placed upon the operating table and anesthesia was induced.  The old scar at the left chest Port-A-Cath site was anesthetized with lidocaine.  I made incision with scalpel through the previous scar.  I then dissected down to the port which was identified.  I excised the Prolene sutures and then was able to remove the port and the catheter easily in its entirety.  I anesthetized the incision further with lidocaine.  I then closed the subcutaneous tissue with interrupted 3-0 Vicryl sutures and closed skin with a running 4-0 Monocryl.  Dermabond was then applied.  The patient tolerated the procedure well.  All the counts were correct at the end of the procedure.  Patient was then taken in a stable condition from the operating room to the recovery room.         Coralie Keens   Date: 04/25/2020  Time: 12:02 PM

## 2020-04-25 NOTE — Anesthesia Preprocedure Evaluation (Addendum)
Anesthesia Evaluation  Patient identified by MRN, date of birth, ID band Patient awake    Reviewed: Patient's Chart, lab work & pertinent test results, reviewed documented beta blocker date and time   Airway Mallampati: II  TM Distance: >3 FB Neck ROM: Full    Dental  (+) Teeth Intact   Pulmonary    Pulmonary exam normal        Cardiovascular hypertension, Pt. on medications and Pt. on home beta blockers + dysrhythmias  Rhythm:Regular Rate:Normal     Neuro/Psych Depression    GI/Hepatic GERD  Medicated,  Endo/Other  diabetes, Well Controlled, Type 2, Oral Hypoglycemic Agents  Renal/GU Renal diseaseRecurrent stones     Musculoskeletal  (+) Arthritis , History of breast cancer   Abdominal (+)  Abdomen: soft. Bowel sounds: normal.  Peds  Hematology  (+) Blood dyscrasia, anemia ,   Anesthesia Other Findings   Reproductive/Obstetrics                            Anesthesia Physical Anesthesia Plan  ASA: III  Anesthesia Plan: MAC   Post-op Pain Management:    Induction:   PONV Risk Score and Plan: 2 and Propofol infusion  Airway Management Planned: Simple Face Mask and Nasal Cannula  Additional Equipment: None  Intra-op Plan:   Post-operative Plan:   Informed Consent: I have reviewed the patients History and Physical, chart, labs and discussed the procedure including the risks, benefits and alternatives for the proposed anesthesia with the patient or authorized representative who has indicated his/her understanding and acceptance.     Dental advisory given  Plan Discussed with: CRNA  Anesthesia Plan Comments: (ECHO 10/21: When compared to the prior study from 09/15/2019 LVEF appears to be slightly improved, from 30-35% to 40-45% with global hypokinesis,. 2. Left ventricular ejection fraction, by estimation, is 40 to 45%. The left ventricle has mildly decreased function. The  left ventricle demonstrates global hypokinesis. Left ventricular diastolic parameters are consistent with Grade I diastolic dysfunction (impaired relaxation). 3. Right ventricular systolic function is normal. The right ventricular size is normal. 4. The mitral valve is normal in structure. Trivial mitral valve regurgitation. No evidence of mitral stenosis. 5. The aortic valve is normal in structure. Aortic valve regurgitation is not visualized. No aortic stenosis is present. 6. The inferior vena cava is normal in size with greater than 50% respiratory variability, suggesting right atrial pressure of 3 mmHg)        Anesthesia Quick Evaluation

## 2020-04-25 NOTE — Transfer of Care (Signed)
Immediate Anesthesia Transfer of Care Note  Patient: Margaret Norman  Procedure(s) Performed: REMOVAL PORT-A-CATH (Left Chest)  Patient Location: PACU  Anesthesia Type:MAC  Level of Consciousness: awake, alert  and oriented  Airway & Oxygen Therapy: Patient Spontanous Breathing  Post-op Assessment: Report given to RN and Post -op Vital signs reviewed and stable  Post vital signs: Reviewed and stable  Last Vitals:  Vitals Value Taken Time  BP 103/59 04/25/20 1204  Temp    Pulse 85 04/25/20 1206  Resp 19 04/25/20 1206  SpO2 95 % 04/25/20 1206  Vitals shown include unvalidated device data.  Last Pain:  Vitals:   04/25/20 1035  TempSrc: Oral  PainSc: 0-No pain      Patients Stated Pain Goal: 3 (40/35/24 8185)  Complications: No complications documented.

## 2020-04-25 NOTE — Interval H&P Note (Signed)
History and Physical Interval Note:no change in H and P  04/25/2020 11:14 AM  Sherry C Recktenwald  has presented today for surgery, with the diagnosis of hx or breast cancer, port no longer needed.  The various methods of treatment have been discussed with the patient and family. After consideration of risks, benefits and other options for treatment, the patient has consented to  Procedure(s): REMOVAL PORT-A-CATH (N/A) as a surgical intervention.  The patient's history has been reviewed, patient examined, no change in status, stable for surgery.  I have reviewed the patient's chart and labs.  Questions were answered to the patient's satisfaction.     Coralie Keens

## 2020-04-26 ENCOUNTER — Encounter (HOSPITAL_BASED_OUTPATIENT_CLINIC_OR_DEPARTMENT_OTHER): Payer: Self-pay | Admitting: Surgery

## 2020-05-02 DIAGNOSIS — S42295D Other nondisplaced fracture of upper end of left humerus, subsequent encounter for fracture with routine healing: Secondary | ICD-10-CM | POA: Diagnosis not present

## 2020-05-02 DIAGNOSIS — M25612 Stiffness of left shoulder, not elsewhere classified: Secondary | ICD-10-CM | POA: Diagnosis not present

## 2020-05-09 DIAGNOSIS — M25612 Stiffness of left shoulder, not elsewhere classified: Secondary | ICD-10-CM | POA: Diagnosis not present

## 2020-05-09 DIAGNOSIS — S42295D Other nondisplaced fracture of upper end of left humerus, subsequent encounter for fracture with routine healing: Secondary | ICD-10-CM | POA: Diagnosis not present

## 2020-05-11 DIAGNOSIS — S42295D Other nondisplaced fracture of upper end of left humerus, subsequent encounter for fracture with routine healing: Secondary | ICD-10-CM | POA: Diagnosis not present

## 2020-05-11 DIAGNOSIS — M25612 Stiffness of left shoulder, not elsewhere classified: Secondary | ICD-10-CM | POA: Diagnosis not present

## 2020-05-16 DIAGNOSIS — S42295D Other nondisplaced fracture of upper end of left humerus, subsequent encounter for fracture with routine healing: Secondary | ICD-10-CM | POA: Diagnosis not present

## 2020-05-16 DIAGNOSIS — M25612 Stiffness of left shoulder, not elsewhere classified: Secondary | ICD-10-CM | POA: Diagnosis not present

## 2020-05-18 ENCOUNTER — Encounter: Payer: Self-pay | Admitting: Internal Medicine

## 2020-05-18 ENCOUNTER — Ambulatory Visit (INDEPENDENT_AMBULATORY_CARE_PROVIDER_SITE_OTHER): Payer: Medicare Other | Admitting: Internal Medicine

## 2020-05-18 ENCOUNTER — Other Ambulatory Visit: Payer: Self-pay

## 2020-05-18 ENCOUNTER — Inpatient Hospital Stay: Payer: Medicare Other

## 2020-05-18 ENCOUNTER — Inpatient Hospital Stay: Payer: Medicare Other | Attending: Hematology and Oncology | Admitting: *Deleted

## 2020-05-18 ENCOUNTER — Encounter: Payer: Self-pay | Admitting: *Deleted

## 2020-05-18 VITALS — BP 120/77 | HR 85 | Ht 62.0 in | Wt 133.6 lb

## 2020-05-18 DIAGNOSIS — E1165 Type 2 diabetes mellitus with hyperglycemia: Secondary | ICD-10-CM

## 2020-05-18 DIAGNOSIS — E042 Nontoxic multinodular goiter: Secondary | ICD-10-CM | POA: Diagnosis not present

## 2020-05-18 DIAGNOSIS — C50411 Malignant neoplasm of upper-outer quadrant of right female breast: Secondary | ICD-10-CM

## 2020-05-18 DIAGNOSIS — Z170 Estrogen receptor positive status [ER+]: Secondary | ICD-10-CM

## 2020-05-18 DIAGNOSIS — Z794 Long term (current) use of insulin: Secondary | ICD-10-CM | POA: Diagnosis not present

## 2020-05-18 DIAGNOSIS — E785 Hyperlipidemia, unspecified: Secondary | ICD-10-CM | POA: Diagnosis not present

## 2020-05-18 LAB — RESEARCH LABS

## 2020-05-18 LAB — POCT GLYCOSYLATED HEMOGLOBIN (HGB A1C): Hemoglobin A1C: 7 % — AB (ref 4.0–5.6)

## 2020-05-18 NOTE — Patient Instructions (Signed)
Please continue: - Metformin 2000 mg with dinner - Jardiance 25 mg daily before breakfast - Trulicity 3 mg weekly - Lantus 30 units at night  NO EATING AFTER 7 PM!  Please return in 4 months with your sugar log.  

## 2020-05-18 NOTE — Addendum Note (Signed)
Addended by: Lauralyn Primes on: 05/18/2020 10:18 AM   Modules accepted: Orders

## 2020-05-18 NOTE — Progress Notes (Signed)
Patient ID: Margaret Norman, female   DOB: 06/14/1951, 69 y.o.   MRN: [id number]   This visit occurred during the SARS-CoV-2 public health emergency.  Safety protocols were in place, including screening questions prior to the visit, additional usage of staff PPE, and extensive cleaning of exam room while observing appropriate contact time as indicated for disinfecting solutions.   HPI: Margaret Norman is a 69 y.o.-year-old female, returning for follow-up for DM2, dx in 2008, insulin-dependent since 2016, uncontrolled, without long term complications.  Last visit almost 4 months ago.  She fractured humerus in 02/2020 after tripping >> this is healing.  She will have a DXA scan next month.  She is on Arimidex.  At last visit, sugars are higher as she was eating out more.  Reviewed HbA1c levels: Lab Results  Component Value Date   HGBA1C 7.4 (A) 01/14/2020   HGBA1C 6.6 (A) 09/17/2019   HGBA1C 8.6 (H) 04/10/2019  03/19/2017: HbA1c calculated from fructosamine is 6.7%! 12/31/2016: HbA1c calculated from fructosamine: 6.9%!  Pt is on: - Metformin 1000 mg 2x a day with meals >> 2000 mg with dinner - Jardiance 25 mg daily before breakfast - Trulicity 1.5 >> 3 mg weekly - Lantus 30 units at night Stopped Actos when started trulicity back. She was on Trulicity but became expensive >> changed to Actos. She was on Invokana >> recurrent UTIs.  Pt checks her sugars once a day which she only brings a record for the last week: - am: 93-167, 183 (snack at bedtime) >> 95-192, 228 >> 78-133, 184 - 2h after b'fast: 113-163, 208 >> 161-190 >> 189-210 >> n/c - before lunch: 128-174 >> 199 >> n/c >> 135-215 >> n/c - 2h after lunch: 186- 194 >> 161 >> 176 >> 96 >> 198 - before dinner: 163, 168 >> 240 (?) >> 119, 167 >> 108 - 2h after dinner: n/c >> 292 >> n/c >> 156 - bedtime: 118, 125 >> 148-167 >> 172, 200 >> 138, 217, 227 - nighttime: n/c >> 172 >> 178 >> n/c Lowest sugar was 87 >> 93 >> 95 >> 78;  she has hypoglycemia awareness in the 70s. Highest sugar was 292 >> 240 x1, 190 >> 228 >> 227.  Glucometer: OneTouch Verio  -No CKD, last BUN/creatinine:  Lab Results  Component Value Date   BUN 24 (H) 04/20/2020   BUN 22 02/17/2020   CREATININE 0.92 04/20/2020   CREATININE 0.85 02/17/2020  On Entresto. -+ HL; last set of lipids: Lab Results  Component Value Date   CHOL 101 02/02/2019   HDL 47.40 02/02/2019   LDLCALC 38 02/02/2019   LDLDIRECT 137.9 02/04/2013   TRIG 78.0 02/02/2019   CHOLHDL 2 02/02/2019  On Crestor 10. - last eye exam was in 06/2019: No DR, + cataract OS.  Dr. Marica Otter. -+numbness and tingling in her toes.  She has restless leg syndrome.  Pt has FH of DM in MGM.  At last visit, I noticed left thyromegaly.    We checked a thyroid ultrasound (01/26/2020): Parenchymal Echotexture: Moderately heterogenous Isthmus: 0.3 cm Right lobe: 6.2 cm x 2.1 cm x 2.2 cm Left lobe: 7.7 cm x 3.2 cm x 2.8 cm _________________________________________________________  Nodule # 1: Location: Right; Superior Maximum size: 0.9 cm; Other 2 dimensions: 0.7 cm x 0.7 cm Composition: spongiform (0) Spongiform nodule does not meet criteria for surveillance or biopsy _________________________________________________________  Nodule # 2: Location: Right; Mid Maximum size: 1.0 cm; Other 2 dimensions: 0.9 cm x  0.6 cm Composition: spongiform (0) Spongiform nodule does not meet criteria for surveillance or biopsy _________________________________________________________  Nodule # 3: Location: Right; Mid Maximum size: 1.2 cm; Other 2 dimensions: 1.1 cm x 0.7 cm Composition: spongiform (0) Spongiform nodule does not meet criteria for surveillance or biopsy _________________________________________________________  Nodule # 4: Location: Right; Inferior Maximum size: 1.0 cm; Other 2 dimensions: 0.8 cm x 0.8 cm Composition: spongiform (0) Spongiform nodule does not  meet criteria for surveillance or biopsy ________________________________________________________  Nodule # 5: Location: Right; Inferior Maximum size: 1.5 cm; Other 2 dimensions: 1.4 cm x 0.8 cm Composition: solid/almost completely solid (2) Echogenicity: isoechoic (1) Nodule meets criteria for surveillance _________________________________________________________  Nodule # 6: Location: Left; Superior Maximum size: 1.2 cm; Other 2 dimensions: 1.1 cm x 0.9 cm Composition: cannot determine (2) Echogenicity: isoechoic (1) Echogenic foci: macrocalcifications (1) Nodule meets criteria for surveillance _______________________________________________________  Nodule # 7: Location: Left; Inferior Maximum size: 4.7 cm; Other 2 dimensions: 4.7 cm x 2.8 cm Composition: mixed cystic and solid (1) Echogenicity: isoechoic (1) Echogenic foci: macrocalcifications (1) Nodule meets criteria for biopsy ______________________________________________________  No adenopathy  IMPRESSION: Multinodular thyroid.  Left inferior thyroid nodule (labeled 7, TR , 4.7 cm) meets criteria for biopsy, as designated by the newly established ACR TI-RADS criteria, and referral for biopsy is recommended.  Right inferior thyroid nodule (labeled 5, 1.5 cm, TR 3) and the left superior thyroid nodule (labeled 6, 1.2 cm, TR 4) both meet criteria for surveillance, as designated by the newly established ACR TI-RADS criteria. Surveillance ultrasound study recommended to be performed annually up to 5 years.  FNA left inferior thyroid nodule (02/16/2020): Benign  Pt denies: - feeling nodules in neck - hoarseness - dysphagia - choking - SOB with lying down  Latest TSH was normal Lab Results  Component Value Date   TSH 1.167 04/15/2019   She continues to have a lot of stress at home with her grandson who 64, is bipolar, and was laid off from work - who is living with her and her husband along  with patient's daughter.   She sees Cardiology - Dr. Aundra Dubin >> on [place], Coreg. She also has a history of breast cancer, now s/p chemoradiation therapy.  ROS: Constitutional: no weight gain/+ weight loss and decreased appetite, no fatigue, no subjective hyperthermia, no subjective hypothermia Eyes: no blurry vision, no xerophthalmia ENT: no sore throat, + see HPI Cardiovascular: no CP/no SOB/no palpitations/no leg swelling Respiratory: no cough/no SOB/no wheezing Gastrointestinal: no N/no V/no D/no C/no acid reflux Musculoskeletal: no muscle aches/+ joint aches Skin: no rashes, no hair loss Neurological: no tremors/+ numbness/+ tingling/no dizziness  I reviewed pt's medications, allergies, PMH, social hx, family hx, and changes were documented in the history of present illness. Otherwise, unchanged from my initial visit note.  Past Medical History:  Diagnosis Date  . Arthritis   . Chest pain    Nuclear, adenosine,  December, 2013, low risk nuclear scan with small, moderate in intensity, fixed anteroseptal defect. This is possibly related to an LBBB versus small prior infarct. No ischemia  . Depression   . Gallstones 11-06  . GERD (gastroesophageal reflux disease)   . History of kidney stones   . HTN (hypertension)   . Hyperlipidemia   . Kidney mass 01/28/2014  . LBBB (left bundle branch block)   . Nephrolithiasis   . Pneumonia   . 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 Type II or unspecified type diabetes mellitus without mention of complication, not  stated as uncontrolled    Past Surgical History:  Procedure Laterality Date  . APPENDECTOMY    . BREAST LUMPECTOMY WITH RADIOACTIVE SEED AND SENTINEL LYMPH NODE BIOPSY Right 04/15/2019   Procedure: RIGHT BREAST PARTIAL MASTECTOMY WITH RADIOACTIVE SEED AND SENTINEL LYMPH NODE BIOPSY;  Surgeon: Coralie Keens, MD;  Location: [facility];  Service: General;  Laterality: Right;  . CESAREAN SECTION     x2 ? w/appy  .  CHOLECYSTECTOMY    . ESOPHAGOGASTRODUODENOSCOPY    . HERNIA REPAIR    . KNEE ARTHROSCOPY Right 11/2005  . LIVER BIOPSY    . PANCREATIC CYST EXCISION    . PORT-A-CATH REMOVAL Left 04/25/2020   Procedure: REMOVAL PORT-A-CATH;  Surgeon: Coralie Keens, MD;  Location: [facility];  Service: General;  Laterality: Left;  . PORTACATH PLACEMENT Left 04/15/2019   Procedure: INSERTION PORT-A-CATH WITH ULTRASOUND;  Surgeon: Coralie Keens, MD;  Location: [facility];  Service: General;  Laterality: Left;  . RIGHT/LEFT HEART CATH AND CORONARY ANGIOGRAPHY N/A 09/28/2019   Procedure: RIGHT/LEFT HEART CATH AND CORONARY ANGIOGRAPHY;  Surgeon: Larey Dresser, MD;  Location: [facility];  Service: Cardiovascular;  Laterality: N/A;  . SPLENECTOMY    . TONSILLECTOMY AND ADENOIDECTOMY     Social History   Socioeconomic History  . Marital status: Married    Spouse name: Harolyn Cocker  . Number of children: 2  . Years of education: Not on file  . Highest education level: Not on file  Occupational History  . Occupation: CSR    Employer: [employer]  . Occupation: retired  Tobacco Use  . Smoking status: Never Smoker  . Smokeless tobacco: Never Used  Vaping Use  . Vaping Use: Never used  Substance and Sexual Activity  . Alcohol use: No  . Drug use: No  . Sexual activity: Not Currently  Other Topics Concern  . Not on file  Social History Narrative   Regular Exercise -  NO   Social Determinants of Health   Financial Resource Strain: Low Risk   . Difficulty of Paying Living Expenses: Not hard at all  Food Insecurity: No Food Insecurity  . Worried About Charity fundraiser in the Last Year: Never true  . Ran Out of Food in the Last Year: Never true  Transportation Needs: No Transportation Needs  . Lack of Transportation (Medical): No  . Lack of Transportation (Non-Medical): No  Physical Activity: Sufficiently Active  . Days of Exercise per Week: 5 days  . Minutes of  Exercise per Session: 30 min  Stress: No Stress Concern Present  . Feeling of Stress : Not at all  Social Connections: Socially Integrated  . Frequency of Communication with Friends and Family: More than three times a week  . Frequency of Social Gatherings with Friends and Family: More than three times a week  . Attends Religious Services: More than 4 times per year  . Active Member of Clubs or Organizations: Yes  . Attends Archivist Meetings: More than 4 times per year  . Marital Status: Married  Human resources officer Violence: Not At Risk  . Fear of Current or Ex-Partner: No  . Emotionally Abused: No  . Physically Abused: No  . Sexually Abused: No   Current Outpatient Medications on File Prior to Visit  Medication Sig Dispense Refill  . acetaminophen (TYLENOL) 500 MG tablet Take 1,000 mg by mouth every 6 (six) hours as needed for moderate pain.    Marland Kitchen anastrozole (  ARIMIDEX) 1 MG tablet Take 1 tablet (1 mg total) by mouth daily. 90 tablet 3  . aspirin 81 MG EC tablet Take 81 mg by mouth daily.      Marland Kitchen b complex vitamins tablet Take 1 tablet by mouth daily at 12 noon.    . Blood Glucose Monitoring Suppl (ONETOUCH VERIO FLEX SYSTEM) W/DEVICE KIT 1 kit by Does not apply route 3 (three) times daily. 1 kit 1  . buPROPion (WELLBUTRIN XL) 150 MG 24 hr tablet Take 150 mg by mouth daily.    . carvedilol (COREG) 12.5 MG tablet Take 1 tablet (12.5 mg total) by mouth 2 (two) times daily. 60 tablet 5  . Cholecalciferol (VITAMIN D) 50 MCG (2000 UT) tablet Take 2,000 Units by mouth daily.    . empagliflozin (JARDIANCE) 25 MG TABS tablet Take 25 mg by mouth daily. 90 tablet 3  . glucose blood (ONETOUCH VERIO) test strip Test three times daily as directed. DX: E11.9 300 each 3  . Insulin Pen Needle 32G X 4 MM MISC 1 each by Other route daily. E11.9; THIS IS THE CORRECT PEN NEEDLE. PLEASE FILL RX AS ORDERED 100 each 3  . LANTUS SOLOSTAR 100 UNIT/ML Solostar Pen INJECT SUBCUTANEOUSLY 30  UNITS AT  BEDTIME 30 mL 3  . metFORMIN (GLUCOPHAGE) 1000 MG tablet Take 1,000 mg by mouth 2 (two) times daily with a meal.    . omeprazole (PRILOSEC) 40 MG capsule Take 40 mg by mouth daily.    Glory Rosebush Delica Lancets 16X MISC 1 each by Does not apply route daily. E11.9 100 each 0  . Probiotic Product (PROBIOTIC ADVANCED PO) Take 1 tablet by mouth at bedtime.     . rosuvastatin (CRESTOR) 10 MG tablet TAKE 1 TABLET BY MOUTH DAILY 90 tablet 1  . sacubitril-valsartan (ENTRESTO) 49-51 MG Take 1 tablet by mouth 2 (two) times daily. 60 tablet 3  . sertraline (ZOLOFT) 100 MG tablet TAKE 1 TABLET BY MOUTH DAILY 90 tablet 2  . spironolactone (ALDACTONE) 25 MG tablet Take 1 tablet (25 mg total) by mouth daily. 90 tablet 3  . traMADol (ULTRAM) 50 MG tablet Take 1 tablet (50 mg total) by mouth every 6 (six) hours as needed for moderate pain or severe pain. 20 tablet 0  . TRULICITY 3 WR/6.0AV SOPN INJECT 3 MG INTO THE SKIN  WEEKLY (Patient taking differently: Inject 3 mg as directed every Saturday. ) 12 pen 2  . vitamin B-12 (CYANOCOBALAMIN) 100 MCG tablet Take 100 mcg by mouth daily.    . Vitamin E 180 MG CAPS Take 180 mg by mouth at bedtime.      No current facility-administered medications on file prior to visit.   Allergies  Allergen Reactions  . Invokana [Canagliflozin] Other (See Comments)    UTI's  . Amoxicillin Other (See Comments)    Sore tongue Did it involve swelling of the face/tongue/throat, SOB, or low BP? No Did it involve sudden or severe rash/hives, skin peeling, or any reaction on the inside of your mouth or nose? No Did you need to seek medical attention at a hospital or doctor's office? No When did it last happen?5-10 years ago If all above answers are "NO", may proceed with cephalosporin use.    Family History  Problem Relation Age of Onset  . Liver disease Mother   . Dementia Mother   . Diabetes Mother        borderline  . Coronary artery disease Father   . Heart  attack  Father   . Hypertension Father   . Heart disease Father   . Cancer Other        leukemia  . Stroke Maternal Grandfather   . Hyperlipidemia Brother        Amyloidosis  . Diabetes Maternal Grandmother   . Cancer Paternal Uncle        unknown  . Heart attack Paternal Grandmother   . Heart attack Paternal Uncle   . Hypertension Brother   . Diabetes Daughter        borderline  . Colon cancer Neg Hx     PE: BP 120/77   Pulse 85   Ht _0  (1.575 m)   Wt 133 lb 9.6 oz (60.6 kg)   SpO2 94%   BMI 24.44 kg/m   Wt Readings from Last 3 Encounters:  05/18/20 133 lb 9.6 oz (60.6 kg)  04/25/20 135 lb 2.3 oz (61.3 kg)  04/04/20 136 lb 6.4 oz (61.9 kg)   Constitutional: overweight, in NAD Eyes: PERRLA, EOMI, no exophthalmos ENT: moist mucous membranes, + L  thyromegaly, no cervical lymphadenopathy Cardiovascular: RRR, No MRG Respiratory: CTA B Gastrointestinal: abdomen soft, NT, ND, BS+ Musculoskeletal: no deformities, strength intact in all 4 Skin: moist, warm, no rashes Neurological: no tremor with outstretched hands, DTR normal in all 4  ASSESSMENT: 1. DM2, insulin-dependent, uncontrolled, without long term complications, but with hyperglycemia  2. HL  3.  Multiple thyroid nodules  PLAN:  1. Patient with longstanding, uncontrolled, type 2 diabetes, on oral antidiabetic regimen with Metformin and SGLT2 inhibitor, also, long-acting insulin and weekly GLP-1 receptor agonist, with still suboptimal latest HbA1c obtained at last visit at that time, I recommended to move the entire Metformin dose with dinner to improve her morning blood sugars and I also strongly advised her not to eat after 7 PM and to try to limit eating out.  We did not change the rest of her regimen. -At today's visit her CBG record for the last week, sugars have improved and they are almost all at goal in the morning.  Later in the day, they are mostly at goal, but she had 2 consecutive days in the middle of the  month in which blood sugars are in the 200s.  She does not remember what happened at that time.  I advised her to stay on the same regimen for now, but did continue to not eat after 7 PM and also to bring the entire blood sugar log at next visit. - I suggested to:  Patient Instructions  Please continue: - Metformin 2000 mg with dinner - Jardiance 25 mg daily before breakfast - Trulicity 3 mg weekly - Lantus 30 units at night  NO EATING AFTER 7 PM!  Please return in 4 months with your sugar log.   - we checked her HbA1c: 7.0% (better) - advised to check sugars at different times of the day - 1x a day, rotating check times - advised for yearly eye exams >> she is UTD - return to clinic in 4 months    2. HL -Reviewed latest lipid panel from 01/2019: All fractions at goal: Lab Results  Component Value Date   CHOL 101 02/02/2019   HDL 47.40 02/02/2019   LDLCALC 38 02/02/2019   LDLDIRECT 137.9 02/04/2013   TRIG 78.0 02/02/2019   CHOLHDL 2 02/02/2019  -She continues on Crestor 10 without side effects -She is due for lipid panel >> ordered  3.  Thyroid nodules -At  last visit, her left thyroid lobe was enlarged and I ordered a thyroid ultrasound (01/26/2020): She had several nodules, of which, a L inf.  Nodule had an indication for biopsy.  She had the biopsy on 02/12/2020 and this was benign.  There were 2 other nodules for which only follow-up was needed. -No neck compression symptoms -TSH was normal in 03/2019 -We will repeat a neck ultrasound in a year from the previous   Philemon Kingdom, MD PhD [facility]

## 2020-05-18 NOTE — Research (Signed)
O1607, A PROSPECTIVE OBSERVATIONAL COHORT STUDY TO DEVELOP A PREDICTIVE MODEL OFTAXANE-INDUCED PERIPHERAL NEUROPATHY IN CANCER PATIENTS. Week 6 study notes:  Patient presented to the clinic alone for her week 93 study visit. The pt said that fell recently and broke her arm.  She reports feeling some numbness in her "big toes".  She said that her toes feel like "cotton balls".   The patient'scurrentmedical history and concomitant medications were reviewed bythe research nursewith the pt. She states that her only cancer treatment now is the oral, antihormonal medication- anastrozole.  PROs:Questionnaireswereprovidedtothe patient upon arrival to the [facility] this morning. Questionnaireswere collectedand checked for completeness and accuracy. Labs:The pt's optional research samples (plasma and serum) were obtained this morning for the week 52 time point.  Physician Assessments:CTCAE and Treatment Burden forms completed and signed by Dr. Lindi Adie. History of Falls:Patientconfirms"2-3"fallsin the past 6 months.  Assessment for Interventions for CIPN:Reviewed with patient andCRFs completed.The pt denied the following medications:  gabapentin, narcotics, tramadol, duloxetine, and ibuprofen. She did report taking Tylenol 500 mg 2 pills/day "> 1 pill per day". She denied taking Tylenol for neuropathy symptoms. She said that she was prescribed tramadol, but she has not taken it.  She reports that her vitamin and supplement dosages havenot changed since her last visit. The pt denies any topical agents to her hands and feet. The pt denies any complementary and alternative medicines or interventions since her last visit.  Neuropen Assessment:Completed per protocol bythiscertified research RN,and recorded byKory Isley,ClinicalResearchCoordinator I. Tuning Fork Assessment:Completed per protocol bythiscertified research RN,and recorded byKory Isley,ClinicalResearchCoordinator  I. Timed Get Up and Go Test:Completed per protocol by this trained research RNwith timing assistance from Clabe Seal, [place] Coordinator 1.  Plan:Informed patient of next study assessmentnext year for week 104 due in November 2022.Patient denied having any questions at this time.Patient was informedthattheresearchdepartmentwill follow-up with her prior to her next study visit.Patientwasthanked for her time and contribution to study and wasencouraged to call clinicforany questions or concernsshe may haveprior tohernextappointment. Brion Aliment RN, BSN, CCRP Clinical Research Nurse 05/18/2020 1:17 PM

## 2020-05-23 ENCOUNTER — Telehealth: Payer: Medicare Other | Admitting: Physician Assistant

## 2020-05-23 DIAGNOSIS — J069 Acute upper respiratory infection, unspecified: Secondary | ICD-10-CM

## 2020-05-23 MED ORDER — FLUTICASONE PROPIONATE 50 MCG/ACT NA SUSP: 2.0000 | Freq: Every day | NASAL | 0 refills | Status: DC

## 2020-05-23 MED ORDER — BENZONATATE 100 MG PO CAPS: 100.0000 mg | ORAL_CAPSULE | Freq: Three times a day (TID) | ORAL | 0 refills | Status: DC | PRN

## 2020-05-23 NOTE — Progress Notes (Signed)
We are sorry you are not feeling well.  Here is how we plan to help!  Based on what you have shared with me, it looks like you may have a viral upper respiratory infection.  Upper respiratory infections are caused by a large number of viruses; however, rhinovirus is the most common cause.   If you worsen in any way or have not improved in the next 24-48 hours you will need a face to face visit to ensure no other complicating factors.  Symptoms vary from person to person, with common symptoms including sore throat, cough, fatigue or lack of energy and feeling of general discomfort.  A low-grade fever of up to 100.4 may present, but is often uncommon.  Symptoms vary however, and are closely related to a person's age or underlying illnesses.  The most common symptoms associated with an upper respiratory infection are nasal discharge or congestion, cough, sneezing, headache and pressure in the ears and face.  These symptoms usually persist for about 3 to 10 days, but can last up to 2 weeks.  It is important to know that upper respiratory infections do not cause serious illness or complications in most cases.    Upper respiratory infections can be transmitted from person to person, with the most common method of transmission being a person's hands.  The virus is able to live on the skin and can infect other persons for up to 2 hours after direct contact.  Also, these can be transmitted when someone coughs or sneezes; thus, it is important to cover the mouth to reduce this risk.  To keep the spread of the illness at [place], good hand hygiene is very important.  This is an infection that is most likely caused by a virus. There are no specific treatments other than to help you with the symptoms until the infection runs its course.  We are sorry you are not feeling well.  Here is how we plan to help!   For nasal congestion, you may use an oral decongestants such as Mucinex D or if you have glaucoma or high blood  pressure use plain Mucinex.  Saline nasal spray or nasal drops can help and can safely be used as often as needed for congestion.  For your congestion, I have prescribed Fluticasone nasal spray one spray in each nostril twice a day  If you do not have a history of heart disease, hypertension, diabetes or thyroid disease, prostate/bladder issues or glaucoma, you may also use Sudafed to treat nasal congestion.  It is highly recommended that you consult with a pharmacist or your primary care physician to ensure this medication is safe for you to take.     If you have a cough, you may use cough suppressants such as Delsym and Robitussin.  If you have glaucoma or high blood pressure, you can also use Coricidin HBP.   For cough I have prescribed for you A prescription cough medication called Tessalon Perles 100 mg. You may take 1-2 capsules every 8 hours as needed for cough  If you have a sore or scratchy throat, use a saltwater gargle-  to  teaspoon of salt dissolved in a 4-ounce to 8-ounce glass of warm water.  Gargle the solution for approximately 15-30 seconds and then spit.  It is important not to swallow the solution.  You can also use throat lozenges/cough drops and Chloraseptic spray to help with throat pain or discomfort.  Warm or cold liquids can also be helpful in  relieving throat pain.  For headache, pain or general discomfort, you can use Ibuprofen or Tylenol as directed.   Some authorities believe that zinc sprays or the use of Echinacea may shorten the course of your symptoms.   HOME CARE . Only take medications as instructed by your medical team. . Be sure to drink plenty of fluids. Water is fine as well as fruit juices, sodas and electrolyte beverages. You may want to stay away from caffeine or alcohol. If you are nauseated, try taking small sips of liquids. How do you know if you are getting enough fluid? Your urine should be a pale yellow or almost colorless. . Get rest. . Taking a  steamy shower or using a humidifier may help nasal congestion and ease sore throat pain. You can place a towel over your head and breathe in the steam from hot water coming from a faucet. . Using a saline nasal spray works much the same way. . Cough drops, hard candies and sore throat lozenges may ease your cough. . Avoid close contacts especially the very young and the elderly . Cover your mouth if you cough or sneeze . Always remember to wash your hands.   GET HELP RIGHT AWAY IF: . You develop worsening fever. . If your symptoms do not improve within 10 days . You develop yellow or green discharge from your nose over 3 days. . You have coughing fits . You develop a severe head ache or visual changes. . You develop shortness of breath, difficulty breathing or start having chest pain . Your symptoms persist after you have completed your treatment plan  MAKE SURE YOU   Understand these instructions.  Will watch your condition.  Will get help right away if you are not doing well or get worse.  Your e-visit answers were reviewed by a board certified advanced clinical practitioner to complete your personal care plan. Depending upon the condition, your plan could have included both over the counter or prescription medications. Please review your pharmacy choice. If there is a problem, you may call our nursing hot line at and have the prescription routed to another pharmacy. Your safety is important to [place]. If you have drug allergies check your prescription carefully.   You can use MyChart to ask questions about today's visit, request a non-urgent call back, or ask for a work or school excuse for 24 hours related to this e-Visit. If it has been greater than 24 hours you will need to follow up with your provider, or enter a new e-Visit to address those concerns. You will get an e-mail in the next two days asking about your experience.  I hope that your e-visit has been valuable and will speed  your recovery. Thank you for using e-visits.     Greater than 5 minutes, yet less than 10 minutes of time have been spent researching, coordinating, and implementing care for this patient today

## 2020-05-27 ENCOUNTER — Other Ambulatory Visit: Payer: Self-pay | Admitting: Family

## 2020-05-27 ENCOUNTER — Other Ambulatory Visit (HOSPITAL_COMMUNITY): Payer: Self-pay | Admitting: Cardiology

## 2020-05-28 ENCOUNTER — Other Ambulatory Visit: Payer: Self-pay | Admitting: Family

## 2020-05-28 ENCOUNTER — Other Ambulatory Visit: Payer: Self-pay | Admitting: Internal Medicine

## 2020-05-30 DIAGNOSIS — S42295D Other nondisplaced fracture of upper end of left humerus, subsequent encounter for fracture with routine healing: Secondary | ICD-10-CM | POA: Diagnosis not present

## 2020-05-30 DIAGNOSIS — M25612 Stiffness of left shoulder, not elsewhere classified: Secondary | ICD-10-CM | POA: Diagnosis not present

## 2020-05-31 DIAGNOSIS — M8588 Other specified disorders of bone density and structure, other site: Secondary | ICD-10-CM | POA: Diagnosis not present

## 2020-05-31 DIAGNOSIS — Z6824 Body mass index (BMI) 24.0-24.9, adult: Secondary | ICD-10-CM | POA: Diagnosis not present

## 2020-06-01 DIAGNOSIS — S42295D Other nondisplaced fracture of upper end of left humerus, subsequent encounter for fracture with routine healing: Secondary | ICD-10-CM | POA: Diagnosis not present

## 2020-06-01 DIAGNOSIS — Z9889 Other specified postprocedural states: Secondary | ICD-10-CM | POA: Diagnosis not present

## 2020-06-01 DIAGNOSIS — M25612 Stiffness of left shoulder, not elsewhere classified: Secondary | ICD-10-CM | POA: Diagnosis not present

## 2020-06-08 DIAGNOSIS — S42295D Other nondisplaced fracture of upper end of left humerus, subsequent encounter for fracture with routine healing: Secondary | ICD-10-CM | POA: Diagnosis not present

## 2020-06-08 DIAGNOSIS — M25612 Stiffness of left shoulder, not elsewhere classified: Secondary | ICD-10-CM | POA: Diagnosis not present

## 2020-06-10 DIAGNOSIS — S42295D Other nondisplaced fracture of upper end of left humerus, subsequent encounter for fracture with routine healing: Secondary | ICD-10-CM | POA: Diagnosis not present

## 2020-06-10 DIAGNOSIS — M25612 Stiffness of left shoulder, not elsewhere classified: Secondary | ICD-10-CM | POA: Diagnosis not present

## 2020-06-14 ENCOUNTER — Other Ambulatory Visit: Payer: Self-pay

## 2020-06-14 ENCOUNTER — Ambulatory Visit
Admission: RE | Admit: 2020-06-14 | Discharge: 2020-06-14 | Disposition: A | Discharge status: Home / Self Care | Payer: Medicare Other | Source: Ambulatory Visit | Attending: Hematology and Oncology | Admitting: Hematology and Oncology

## 2020-06-14 DIAGNOSIS — Z853 Personal history of malignant neoplasm of breast: Secondary | ICD-10-CM | POA: Diagnosis not present

## 2020-06-14 DIAGNOSIS — C50411 Malignant neoplasm of upper-outer quadrant of right female breast: Secondary | ICD-10-CM

## 2020-06-14 DIAGNOSIS — R928 Other abnormal and inconclusive findings on diagnostic imaging of breast: Secondary | ICD-10-CM | POA: Diagnosis not present

## 2020-06-20 ENCOUNTER — Encounter: Payer: Self-pay | Admitting: Family

## 2020-06-20 ENCOUNTER — Encounter: Payer: Self-pay | Admitting: Internal Medicine

## 2020-06-23 ENCOUNTER — Telehealth: Payer: Self-pay | Admitting: Internal Medicine

## 2020-06-23 NOTE — Telephone Encounter (Signed)
[facility] Paperwork Dropped Off by patient for Dr Elvera Lennox to complete

## 2020-06-27 ENCOUNTER — Encounter: Payer: Self-pay | Admitting: Family

## 2020-06-27 ENCOUNTER — Encounter (HOSPITAL_COMMUNITY): Payer: Self-pay

## 2020-06-28 ENCOUNTER — Encounter (HOSPITAL_COMMUNITY): Payer: Self-pay | Admitting: Cardiology

## 2020-06-28 ENCOUNTER — Ambulatory Visit (HOSPITAL_COMMUNITY)
Admission: RE | Admit: 2020-06-28 | Discharge: 2020-06-28 | Disposition: A | Discharge status: Home / Self Care | Payer: HMO | Source: Ambulatory Visit | Attending: Cardiology | Admitting: Cardiology

## 2020-06-28 ENCOUNTER — Other Ambulatory Visit: Payer: Self-pay

## 2020-06-28 VITALS — BP 120/80 | HR 85 | Ht 62.0 in | Wt 134.2 lb

## 2020-06-28 DIAGNOSIS — Z79899 Other long term (current) drug therapy: Secondary | ICD-10-CM | POA: Diagnosis not present

## 2020-06-28 DIAGNOSIS — I428 Other cardiomyopathies: Secondary | ICD-10-CM | POA: Diagnosis not present

## 2020-06-28 DIAGNOSIS — E1165 Type 2 diabetes mellitus with hyperglycemia: Secondary | ICD-10-CM

## 2020-06-28 DIAGNOSIS — E785 Hyperlipidemia, unspecified: Secondary | ICD-10-CM | POA: Diagnosis not present

## 2020-06-28 DIAGNOSIS — I447 Left bundle-branch block, unspecified: Secondary | ICD-10-CM | POA: Insufficient documentation

## 2020-06-28 DIAGNOSIS — Z7984 Long term (current) use of oral hypoglycemic drugs: Secondary | ICD-10-CM | POA: Diagnosis not present

## 2020-06-28 DIAGNOSIS — Z853 Personal history of malignant neoplasm of breast: Secondary | ICD-10-CM | POA: Insufficient documentation

## 2020-06-28 DIAGNOSIS — Z7982 Long term (current) use of aspirin: Secondary | ICD-10-CM | POA: Diagnosis not present

## 2020-06-28 DIAGNOSIS — Z170 Estrogen receptor positive status [ER+]: Secondary | ICD-10-CM

## 2020-06-28 DIAGNOSIS — C50411 Malignant neoplasm of upper-outer quadrant of right female breast: Secondary | ICD-10-CM | POA: Diagnosis not present

## 2020-06-28 DIAGNOSIS — I5022 Chronic systolic (congestive) heart failure: Secondary | ICD-10-CM | POA: Diagnosis not present

## 2020-06-28 DIAGNOSIS — Z794 Long term (current) use of insulin: Secondary | ICD-10-CM | POA: Insufficient documentation

## 2020-06-28 HISTORY — DX: Heart failure, unspecified: I50.9

## 2020-06-28 LAB — BASIC METABOLIC PANEL
Anion gap: 12 (ref 5–15)
BUN: 17 mg/dL (ref 8–23)
CO2: 24 mmol/L (ref 22–32)
Calcium: 9.5 mg/dL (ref 8.9–10.3)
Chloride: 107 mmol/L (ref 98–111)
Creatinine, Ser: 0.77 mg/dL (ref 0.44–1.00)
GFR, Estimated: >60 mL/min (ref >60)
Glucose, Bld: 131 mg/dL — ABNORMAL HIGH (ref 70–99)
Potassium: 4.3 mmol/L (ref 3.5–5.1)
Sodium: 143 mmol/L (ref 135–145)

## 2020-06-28 LAB — LIPID PANEL
Cholesterol: 97 mg/dL (ref 0–200)
HDL: 47 mg/dL (ref >40)
LDL Cholesterol: 37 mg/dL (ref 0–99)
Total CHOL/HDL Ratio: 2.1 RATIO
Triglycerides: 67 mg/dL (ref <150)
VLDL: 13 mg/dL (ref 0–40)

## 2020-06-28 MED ORDER — CARVEDILOL 12.5 MG PO TABS
18.7500 mg | ORAL_TABLET | Freq: Two times a day (BID) | ORAL | 5 refills | Status: DC
Start: 2020-06-28 — End: 2020-10-10

## 2020-06-28 NOTE — Patient Instructions (Addendum)
INCREASE Carvedilol 18.75mg  (1 1/2 tablets) twice daily  Labs done today, your results will be available in MyChart, we will contact you for abnormal readings.  Your physician recommends that you schedule a follow-up appointment in: 3 months with an echocardiogram  Your physician has requested that you have an echocardiogram. Echocardiography is a painless test that uses sound waves to create images of your heart. It provides your doctor with information about the size and shape of your heart and how well your heart's chambers and valves are working. This procedure takes approximately one hour. There are no restrictions for this procedure.   If you have any questions or concerns before your next appointment please send [place] a message through [place] or call our office at [phone].    TO LEAVE A MESSAGE FOR THE NURSE SELECT OPTION 2, PLEASE LEAVE A MESSAGE INCLUDING: . YOUR NAME . DATE OF BIRTH . CALL BACK NUMBER . REASON FOR CALL**this is important as we prioritize the call backs  YOU WILL RECEIVE A CALL BACK THE SAME DAY AS LONG AS YOU CALL BEFORE 4:00 PM

## 2020-06-28 NOTE — Progress Notes (Signed)
PCP: [name], FNP Oncology: Dr. Lindi Adie HF cardiology: Dr. Aundra Dubin   70 y.o. with history of DM, chronic LBBB, and breast cancer was referred by Dr. Lindi Adie for workup of fall in EF.  She was diagnosed with right breast cancer, ER+/PR+/HER2+ in 9/20.  She had right lumpectomy in 10/20.  Starting in 11/20, she had been getting 12 cycles of Taxol/Herceptin to be followed by Herceptin alone to complete a year. Echo in 10/20 showed EF 55-60% per report (looked more like 50% on my review, likely due to septal-lateral dyssynchrony).  Her repeat echo for Herceptin screening was done in August 03, 2022, showing EF down to 30-35% with septal-lateral dyssynchrony.  Herceptin has been stopped for now.  She is currently undergoing radiation therapy.   Echo in 3/21 showed that EF remains 30-35% with prominent septal-lateral dyssynchrony.  Patient has a strong family history of heart problems.  Father died from "ruptured aorta."  Sister had heart valve repair, one brother has a pacemaker, and another brother has AL amyloidosis.   LHC/RHC was done in 4/21. This showed normal filling pressures, preserved cardiac output, and no significant CAD. Cardiac MRI in 5/21 showed EF 36% with septal-lateral dyssynchrony, RV EF normal 45%, no LGE.  Repeat echo was done today and reviewed, EF 40-45% with septal-lateral dyssynchrony.   She returns today for followup of cardiomyopathy.  She fatigues easily but denies significant exertional dyspnea. No chest pain.  No orthopnea/PND.  No lightheadedness.  Weight down 3 lbs.  Of note, brother had sudden death recently.   Labs 03-Aug-2022): K 3.8, creatinine 0.75 Labs (3/21): K 4.5, creatinine 0.71 Labs (4/21): K 4.3, creatinine 0.82 => 0.83 Labs (8/21): hgb 12.7, K 4.7, creatinine 0.85 Labs (10/21): K 4.2, creatinine 0.92  ECG (personally reviewed): NSR, 1st degree AVB, LBBB 128 msec  PMH: 1. Depression 2. Type 2 diabetes 3. Chronic LBBB 4. H/o transient complete heart block: in  10/20 when she was having port-a-cath placed.  5. HTN 6. Hyperlipidemia.  7. Breast cancer: She was diagnosed with right breast cancer, ER+/PR+/HER2+ in 9/20.  She had right lumpectomy in 10/20.  Starting in 11/20, she has been getting 12 cycles of Taxol/Herceptin to be followed by Herceptin alone to complete a year.  8. Chronic systolic CHF: She has had mildly decreased EF in the past, possibly related to LBBB.  Nonischemic cardiomyopathy.  - Echo (4/17): EF 50-55% - Cardiolite (4/17): EF 48% - Echo (10/20): EF 55-60% (reviewed, think more like 50% but technically difficult).  - Echo 08-03-22): EF 30-35%, mild LVH, septal dyssynchrony.  - Echo (3/21): EF 30-35% with prominent septal-lateral dyssynchrony.  - LHC/RHC (4/21): mean RA 1, PA 13/3, mean PCWP 2, CI 3.74, no significant CAD.  - Cardiac MRI (5/21): EF 36% with septal-lateral dyssynchrony, RV EF normal 45%, no LGE.  - Echo (10/21): EF 40-45%, septal-lateral dyssynchrony.   Social History   Socioeconomic History  . Marital status: Married    Spouse name: Julianny Milstein  . Number of children: 2  . Years of education: Not on file  . Highest education level: Not on file  Occupational History  . Occupation: CSR    Employer: [employer]  . Occupation: retired  Tobacco Use  . Smoking status: Never Smoker  . Smokeless tobacco: Never Used  Vaping Use  . Vaping Use: Never used  Substance and Sexual Activity  . Alcohol use: No  . Drug use: No  . Sexual activity: Not Currently  Other Topics Concern  .  Not on file  Social History Narrative   Regular Exercise -  NO   Social Determinants of Health   Financial Resource Strain: Low Risk   . Difficulty of Paying Living Expenses: Not hard at all  Food Insecurity: No Food Insecurity  . Worried About Charity fundraiser in the Last Year: Never true  . Ran Out of Food in the Last Year: Never true  Transportation Needs: No Transportation Needs  . Lack of Transportation (Medical):  No  . Lack of Transportation (Non-Medical): No  Physical Activity: Sufficiently Active  . Days of Exercise per Week: 5 days  . Minutes of Exercise per Session: 30 min  Stress: No Stress Concern Present  . Feeling of Stress : Not at all  Social Connections: Socially Integrated  . Frequency of Communication with Friends and Family: More than three times a week  . Frequency of Social Gatherings with Friends and Family: More than three times a week  . Attends Religious Services: More than 4 times per year  . Active Member of Clubs or Organizations: Yes  . Attends Archivist Meetings: More than 4 times per year  . Marital Status: Married  Human resources officer Violence: Not At Risk  . Fear of Current or Ex-Partner: No  . Emotionally Abused: No  . Physically Abused: No  . Sexually Abused: No   Family History  Problem Relation Age of Onset  . Liver disease Mother   . Dementia Mother   . Diabetes Mother        borderline  . Coronary artery disease Father   . Heart attack Father   . Hypertension Father   . Heart disease Father   . Cancer Other        leukemia  . Stroke Maternal Grandfather   . Hyperlipidemia Brother        Amyloidosis  . Diabetes Maternal Grandmother   . Cancer Paternal Uncle        unknown  . Heart attack Paternal Grandmother   . Heart attack Paternal Uncle   . Hypertension Brother   . Diabetes Daughter        borderline  . Colon cancer Neg Hx    Current Outpatient Medications  Medication Sig Dispense Refill  . acetaminophen (TYLENOL) 500 MG tablet Take 1,000 mg by mouth every 6 (six) hours as needed for moderate pain.    Marland Kitchen anastrozole (ARIMIDEX) 1 MG tablet Take 1 tablet (1 mg total) by mouth daily. 90 tablet 3  . aspirin 81 MG EC tablet Take 81 mg by mouth daily.    Marland Kitchen b complex vitamins tablet Take 1 tablet by mouth daily at 12 noon.    . benzonatate (TESSALON PERLES) 100 MG capsule Take 1 capsule (100 mg total) by mouth 3 (three) times daily as  needed for cough (cough). 20 capsule 0  . Blood Glucose Monitoring Suppl (ONETOUCH VERIO FLEX SYSTEM) W/DEVICE KIT 1 kit by Does not apply route 3 (three) times daily. 1 kit 1  . buPROPion (WELLBUTRIN XL) 150 MG 24 hr tablet TAKE 1 TABLET BY MOUTH DAILY IN THE MORNING 90 tablet 1  . Cholecalciferol (VITAMIN D) 50 MCG (2000 UT) tablet Take 2,000 Units by mouth daily.    . empagliflozin (JARDIANCE) 25 MG TABS tablet Take 25 mg by mouth daily. 90 tablet 3  . ENTRESTO 49-51 MG TAKE 1 TABLET BY MOUTH TWICE DAILY 60 tablet 3  . fluticasone (FLONASE) 50 MCG/ACT nasal spray Place 2 sprays  into both nostrils daily. 9.9 g 0  . glucose blood (ONETOUCH VERIO) test strip Test three times daily as directed. DX: E11.9 300 each 3  . Insulin Pen Needle 32G X 4 MM MISC 1 each by Other route daily. E11.9; THIS IS THE CORRECT PEN NEEDLE. PLEASE FILL RX AS ORDERED 100 each 3  . LANTUS SOLOSTAR 100 UNIT/ML Solostar Pen INJECT SUBCUTANEOUSLY 30  UNITS AT BEDTIME 30 mL 3  . metFORMIN (GLUCOPHAGE) 1000 MG tablet TAKE 1 TABLET BY MOUTH  TWICE DAILY WITH MEALS 180 tablet 1  . omeprazole (PRILOSEC) 40 MG capsule TAKE 1 CAPSULE BY MOUTH  DAILY 90 capsule 0  . OneTouch Delica Lancets 42P MISC 1 each by Does not apply route daily. E11.9 100 each 0  . Probiotic Product (PROBIOTIC ADVANCED PO) Take 1 tablet by mouth at bedtime.     . rosuvastatin (CRESTOR) 10 MG tablet TAKE 1 TABLET BY MOUTH DAILY 90 tablet 1  . sertraline (ZOLOFT) 100 MG tablet TAKE 1 TABLET BY MOUTH DAILY 90 tablet 2  . spironolactone (ALDACTONE) 25 MG tablet Take 1 tablet (25 mg total) by mouth daily. 90 tablet 3  . TRULICITY 3 NT/6.1WE SOPN INJECT 3 MG INTO THE SKIN  WEEKLY 6 mL 1  . vitamin B-12 (CYANOCOBALAMIN) 100 MCG tablet Take 100 mcg by mouth daily.    . Vitamin E 180 MG CAPS Take 180 mg by mouth at bedtime.     . carvedilol (COREG) 12.5 MG tablet Take 1.5 tablets (18.75 mg total) by mouth 2 (two) times daily. 180 tablet 5   No current  facility-administered medications for this encounter.   BP 120/80   Pulse 85   Ht 5' 2"  (1.575 m)   Wt 60.9 kg (134 lb 3.2 oz)   SpO2 97%   BMI 24.55 kg/m  General: NAD Neck: No JVD, no thyromegaly or thyroid nodule.  Lungs: Clear to auscultation bilaterally with normal respiratory effort. CV: Nondisplaced PMI.  Heart regular S1/S2, no S3/S4, no murmur.  No peripheral edema.  No carotid bruit.  Normal pedal pulses.  Abdomen: Soft, nontender, no hepatosplenomegaly, no distention.  Skin: Intact without lesions or rashes.  Neurologic: Alert and oriented x 3.  Psych: Normal affect. Extremities: No clubbing or cyanosis.  HEENT: Normal.   Assessment/Plan: 1. Chronic systolic CHF:  Nonischemic cardiomyopathy.  Echo in 1/21 showed EF down to 30-35% with septal-lateral dyssynchrony.  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  Based on the timeline, the most likely cause for fall in EF seems to be Herceptin use.  She is now off Herceptin, but echo in 3/21 showed EF still in the 30-35% range.  LHC/RHC in 4/21 showed low filling pressures, preserved cardiac output, and no significant CAD.  Cardiac MRI in 5/21 showed EF 36%, no LGE (no evidence for infiltrative disease). Of note, brother died of sudden death so also consider familial cardiomyopathy.  Echo in 10/21 showed EF up a bit to 40-45% with dyssynchrony due to LBBB. NYHA class II symptoms.  She is not volume overloaded by exam.  - Continue Entresto 49/51 bid. BMET today.  - Increase Coreg to 18.75 mg bid.  - Continue spironolactone 25 mg daily.  - Continue Jardiance.  - She will not be getting any more Herceptin.   - EF is now out of range for CRT-D.  - Repeat echo at followup  in 3 months.  2. 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  - Follow closely with use of Coreg.   3.  LBBB: Chronic.  ?LBBB cardiomyopathy.  4. Breast cancer: She will not get further Herceptin.    Followup with me in 3 months with echo.   Loralie Champagne 06/28/2020

## 2020-07-01 ENCOUNTER — Ambulatory Visit (INDEPENDENT_AMBULATORY_CARE_PROVIDER_SITE_OTHER): Payer: HMO

## 2020-07-01 ENCOUNTER — Other Ambulatory Visit: Payer: Self-pay

## 2020-07-01 DIAGNOSIS — Z23 Encounter for immunization: Secondary | ICD-10-CM

## 2020-07-12 NOTE — Telephone Encounter (Signed)
Inbound fax from [employer] advising pt has been approved for assistance until the end of the calendar year. [id number]. Medication is being sent to pt directly.

## 2020-07-14 ENCOUNTER — Ambulatory Visit: Payer: PRIVATE HEALTH INSURANCE | Admitting: Cardiovascular Disease

## 2020-07-18 ENCOUNTER — Telehealth (HOSPITAL_COMMUNITY): Payer: Self-pay | Admitting: Pharmacist

## 2020-07-18 NOTE — Telephone Encounter (Signed)
Patient Advocate Encounter   Received notification from [employer] that prior authorization for Margaret Norman is required.   PA submitted via phone Status is pending   Will continue to follow.  Audry Riles, PharmD, BCPS, BCCP, CPP Heart Failure Clinic Pharmacist [phone]

## 2020-07-18 NOTE — Telephone Encounter (Signed)
Advanced Heart Failure Patient Advocate Encounter  Prior Authorization for Margaret Norman has been approved.     Effective dates: 07/18/20 through 07/18/21  Audry Riles, PharmD, BCPS, BCCP, CPP Heart Failure Clinic Pharmacist [phone]

## 2020-07-22 ENCOUNTER — Telehealth (HOSPITAL_COMMUNITY): Payer: Self-pay | Admitting: Pharmacy Technician

## 2020-07-22 ENCOUNTER — Encounter (HOSPITAL_COMMUNITY): Payer: Self-pay

## 2020-07-22 NOTE — Telephone Encounter (Signed)
Patient sent a message, concerned about a PA for [facility]. I called and spoke with the patient. Her Delene Loll PA was approved from 07/18/20-07/18/21.   There is no co-pay currently for a 30 day supply. Patient understood. Advised her to call with any issues.   Charlann Boxer, CPhT

## 2020-07-27 DIAGNOSIS — M25612 Stiffness of left shoulder, not elsewhere classified: Secondary | ICD-10-CM | POA: Diagnosis not present

## 2020-07-28 ENCOUNTER — Encounter: Payer: Self-pay | Admitting: Family

## 2020-08-23 ENCOUNTER — Ambulatory Visit (INDEPENDENT_AMBULATORY_CARE_PROVIDER_SITE_OTHER): Payer: HMO | Admitting: Family

## 2020-08-23 ENCOUNTER — Encounter: Payer: Self-pay | Admitting: Family

## 2020-08-23 ENCOUNTER — Other Ambulatory Visit: Payer: Self-pay

## 2020-08-23 VITALS — BP 108/68 | HR 89 | Temp 98.2°F | Ht 62.0 in | Wt 134.0 lb

## 2020-08-23 DIAGNOSIS — R109 Unspecified abdominal pain: Secondary | ICD-10-CM

## 2020-08-23 DIAGNOSIS — R42 Dizziness and giddiness: Secondary | ICD-10-CM

## 2020-08-23 DIAGNOSIS — R2689 Other abnormalities of gait and mobility: Secondary | ICD-10-CM | POA: Diagnosis not present

## 2020-08-23 DIAGNOSIS — Z794 Long term (current) use of insulin: Secondary | ICD-10-CM | POA: Diagnosis not present

## 2020-08-23 DIAGNOSIS — E1165 Type 2 diabetes mellitus with hyperglycemia: Secondary | ICD-10-CM | POA: Diagnosis not present

## 2020-08-23 LAB — MICROALBUMIN / CREATININE URINE RATIO
Creatinine,U: 72.9 mg/dL
Microalb Creat Ratio: 1.1 mg/g (ref 0.0–30.0)
Microalb, Ur: 0.8 mg/dL (ref 0.0–1.9)

## 2020-08-23 NOTE — Progress Notes (Signed)
Margaret Norman is a 70 y.o. female with the following history as recorded in Epic[employer]:  Patient Active Problem List   Diagnosis Date Noted  . Port-A-Cath in place 06/10/2019  . Breast cancer ([place]) 04/15/2019  . Second degree AV block   . Malignant neoplasm of upper-outer quadrant of right breast in female, estrogen receptor positive ([place]) 03/25/2019  . Thyromegaly 01/10/2018  . Overweight (BMI 25.0-29.9) 08/13/2017  . Upper respiratory tract infection 07/30/2017  . Type 2 diabetes mellitus with hyperglycemia, with long-term current use of insulin ([place]) 03/19/2017  . Thiamine deficiency 01/04/2017  . 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 Deficiency anemia 04/30/2016  . Routine general medical examination at a health care facility 04/27/2015  . Abdominal wall hernia 04/26/2015  . Family history of hemochromatosis 09/13/2014  . Acute maxillary sinusitis 05/14/2014  . Personal history of colonic polyps 03/10/2014  . 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 Lumbosacral spondylosis without myelopathy 05/12/2013  . Insomnia, persistent 02/04/2013  . Obesity (BMI 30-39.9) 02/04/2013  . Unspecified asthma, with exacerbation 09/22/2012  . Visit for screening mammogram 07/04/2012  . Nephrolithiasis   . HTN (hypertension)   . Gallstones   . LBBB (left bundle branch block)   . Leukocytosis 12/20/2008  . Hyperlipidemia with target LDL less than 100 09/04/2007  . Chronic depression 09/04/2007  . GERD 09/04/2007    Current Outpatient Medications  Medication Sig Dispense Refill  . acetaminophen (TYLENOL) 500 MG tablet Take 1,000 mg by mouth every 6 (six) hours as needed for moderate pain.    Marland Kitchen anastrozole (ARIMIDEX) 1 MG tablet Take 1 tablet (1 mg total) by mouth daily. 90 tablet 3  . aspirin 81 MG EC tablet Take 81 mg by mouth daily.    Marland Kitchen b complex vitamins tablet Take 1 tablet by mouth daily at 12 noon.    . Blood Glucose Monitoring Suppl (ONETOUCH VERIO FLEX SYSTEM) W/DEVICE  KIT 1 kit by Does not apply route 3 (three) times daily. 1 kit 1  . buPROPion (WELLBUTRIN XL) 150 MG 24 hr tablet TAKE 1 TABLET BY MOUTH DAILY IN THE MORNING 90 tablet 1  . carvedilol (COREG) 12.5 MG tablet Take 1.5 tablets (18.75 mg total) by mouth 2 (two) times daily. 180 tablet 5  . Cholecalciferol (VITAMIN D) 50 MCG (2000 UT) tablet Take 2,000 Units by mouth daily.    . empagliflozin (JARDIANCE) 25 MG TABS tablet Take 25 mg by mouth daily. 90 tablet 3  . ENTRESTO 49-51 MG TAKE 1 TABLET BY MOUTH TWICE DAILY 60 tablet 3  . fluticasone (FLONASE) 50 MCG/ACT nasal spray Place 2 sprays into both nostrils daily. 9.9 g 0  . glucose blood (ONETOUCH VERIO) test strip Test three times daily as directed. DX: E11.9 300 each 3  . Insulin Pen Needle 32G X 4 MM MISC 1 each by Other route daily. E11.9; THIS IS THE CORRECT PEN NEEDLE. PLEASE FILL RX AS ORDERED 100 each 3  . LANTUS SOLOSTAR 100 UNIT/ML Solostar Pen INJECT SUBCUTANEOUSLY 30  UNITS AT BEDTIME 30 mL 3  . metFORMIN (GLUCOPHAGE) 1000 MG tablet TAKE 1 TABLET BY MOUTH  TWICE DAILY WITH MEALS 180 tablet 1  . omeprazole (PRILOSEC) 40 MG capsule TAKE 1 CAPSULE BY MOUTH  DAILY 90 capsule 0  . OneTouch Delica Lancets 82U MISC 1 each by Does not apply route daily. E11.9 100 each 0  . Probiotic Product (PROBIOTIC ADVANCED PO) Take 1 tablet by mouth at bedtime.     . rosuvastatin (  CRESTOR) 10 MG tablet TAKE 1 TABLET BY MOUTH DAILY 90 tablet 1  . sertraline (ZOLOFT) 100 MG tablet TAKE 1 TABLET BY MOUTH DAILY 90 tablet 2  . spironolactone (ALDACTONE) 25 MG tablet Take 1 tablet (25 mg total) by mouth daily. 90 tablet 3  . TRULICITY 3 OQ/9.4TM SOPN INJECT 3 MG INTO THE SKIN  WEEKLY 6 mL 1  . vitamin B-12 (CYANOCOBALAMIN) 100 MCG tablet Take 100 mcg by mouth daily.    . Vitamin E 180 MG CAPS Take 180 mg by mouth at bedtime.     . benzonatate (TESSALON PERLES) 100 MG capsule Take 1 capsule (100 mg total) by mouth 3 (three) times daily as needed for cough (cough).  20 capsule 0   No current facility-administered medications for this visit.    Allergies: Invokana [canagliflozin] and Amoxicillin  Past Medical History:  Diagnosis Date  . Arthritis   . Chest pain    Nuclear, adenosine,  December, 2013, low risk nuclear scan with small, moderate in intensity, fixed anteroseptal defect. This is possibly related to an LBBB versus small prior infarct. No ischemia  . CHF (congestive heart failure) ([place])   . Depression   . Gallstones 11-06  . GERD (gastroesophageal reflux disease)   . History of kidney stones   . HTN (hypertension)   . Hyperlipidemia   . Kidney mass 01/28/2014  . LBBB (left bundle branch block)   . Nephrolithiasis   . Pneumonia   . 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 Type II or unspecified type diabetes mellitus without mention of complication, not stated as uncontrolled     Past Surgical History:  Procedure Laterality Date  . APPENDECTOMY    . BREAST BIOPSY Left 10/2017  . BREAST BIOPSY Right 03/2019  . BREAST LUMPECTOMY Right 2020  . BREAST LUMPECTOMY WITH RADIOACTIVE SEED AND SENTINEL LYMPH NODE BIOPSY Right 04/15/2019   Procedure: RIGHT BREAST PARTIAL MASTECTOMY WITH RADIOACTIVE SEED AND SENTINEL LYMPH NODE BIOPSY;  Surgeon: Coralie Keens, MD;  Location: [facility];  Service: General;  Laterality: Right;  . CESAREAN SECTION     x2 ? w/appy  . CHOLECYSTECTOMY    . ESOPHAGOGASTRODUODENOSCOPY    . HERNIA REPAIR    . KNEE ARTHROSCOPY Right 11/2005  . LIVER BIOPSY    . PANCREATIC CYST EXCISION    . PORT-A-CATH REMOVAL Left 04/25/2020   Procedure: REMOVAL PORT-A-CATH;  Surgeon: Coralie Keens, MD;  Location: [facility];  Service: General;  Laterality: Left;  . PORTACATH PLACEMENT Left 04/15/2019   Procedure: INSERTION PORT-A-CATH WITH ULTRASOUND;  Surgeon: Coralie Keens, MD;  Location: [facility];  Service: General;  Laterality: Left;  . RIGHT/LEFT HEART CATH AND CORONARY ANGIOGRAPHY N/A 09/28/2019    Procedure: RIGHT/LEFT HEART CATH AND CORONARY ANGIOGRAPHY;  Surgeon: Larey Dresser, MD;  Location: [facility];  Service: Cardiovascular;  Laterality: N/A;  . SPLENECTOMY    . TONSILLECTOMY AND ADENOIDECTOMY      Family History  Problem Relation Age of Onset  . Liver disease Mother   . Dementia Mother   . Diabetes Mother        borderline  . Coronary artery disease Father   . Heart attack Father   . Hypertension Father   . Heart disease Father   . Cancer Other        leukemia  . Stroke Maternal Grandfather   . Hyperlipidemia Brother        Amyloidosis  . Diabetes Maternal Grandmother   .  Cancer Paternal Uncle        unknown  . Heart attack Paternal Grandmother   . Heart attack Paternal Uncle   . Hypertension Brother   . Diabetes Daughter        borderline  . Colon cancer Neg Hx     Social History   Tobacco Use  . Smoking status: Never Smoker  . Smokeless tobacco: Never Used  Substance Use Topics  . Alcohol use: No    Subjective:  6 month follow up; has been having increased belching/ burping- "feel full." Increased constipation; known hiatal hernia; has not been taking Omeprazole regularly- only taking every other day;  Also feels that she is having some increased dizziness/ sense of feeling off balance especially when going from sitting to standing position; known tinnitus as well;     Objective:  Vitals:   08/23/20 0920  BP: 108/68  Pulse: 89  Temp: 98.2 F (36.8 C)  TempSrc: Oral  SpO2: 97%  Weight: 134 lb (60.8 kg)  Height: 5' 2"  (1.575 m)    General: Well developed, well nourished, in no acute distress  Skin : Warm and dry.  Head: Normocephalic and atraumatic  Eyes: Sclera and conjunctiva clear; pupils round and reactive to light; extraocular movements intact  Ears: External normal; canals clear; tympanic membranes normal  Oropharynx: Pink, supple. No suspicious lesions  Lungs: Respirations unlabored;  CVS exam: normal rate and regular  rhythm.  Extremities: No edema, cyanosis, clubbing  Vessels: Symmetric bilaterally  Neurologic: Alert and oriented; speech intact; face symmetrical; moves all extremities well; CNII-XII intact without focal deficit   Assessment:  1. Abdominal pain, unspecified abdominal location   2. Dizziness   3. Balance problem   4. Type 2 diabetes mellitus with hyperglycemia, with long-term current use of insulin ([place])     Plan:  1. Needs to start taking Omeprazole daily; refer to Dr. Collene Mares per patient request; 2. & 3. Update MRI; am concerned that blood pressure is too low- she is encouraged to discuss these concerns with both her endocrinologist and cardiologist; ? If Jardiance could be lowered possibly or  Coreg dosage lowered slightly; will consider referral to neurology based on endocrine and cardiology opinions; follow-up to be determined;  Patient is aware that I am transferring to the [facility] location and will plan to transfer care with me to that office; she understands we can get her a PCP at [facility] if she changes her mind however;  This visit occurred during the SARS-CoV-2 public health emergency.  Safety protocols were in place, including screening questions prior to the visit, additional usage of staff PPE, and extensive cleaning of exam room while observing appropriate contact time as indicated for disinfecting solutions.     No follow-ups on file.  Orders Placed This Encounter  Procedures  . MR Brain W Wo Contrast    Standing Status:   Future    Standing Expiration Date:   08/23/2021    Order Specific Question:   If indicated for the ordered procedure, I authorize the administration of contrast media per Radiology protocol    Answer:   Yes    Order Specific Question:   What is the patient's sedation requirement?    Answer:   No Sedation    Order Specific Question:   Does the patient have a pacemaker or implanted devices?    Answer:   No    Order Specific Question:   Preferred imaging  location?    Answer:  [facility] (table limit-550lbs)  . Urine Microalbumin w/creat. ratio    Standing Status:   Future    Number of Occurrences:   1    Standing Expiration Date:   08/23/2021  . Ambulatory referral to Gastroenterology    Referral Priority:   Routine    Referral Type:   Consultation    Referral Reason:   Specialty Services Required    Referred to Provider:   Juanita Craver, MD    Number of Visits Requested:   1    Requested Prescriptions    No prescriptions requested or ordered in this encounter

## 2020-08-23 NOTE — Patient Instructions (Signed)
Consider discussing dehydration from [place] with Dr. Renne Crigler at next appointment;

## 2020-08-27 ENCOUNTER — Encounter: Payer: Self-pay | Admitting: Internal Medicine

## 2020-08-27 DIAGNOSIS — Z794 Long term (current) use of insulin: Secondary | ICD-10-CM

## 2020-08-27 DIAGNOSIS — E1165 Type 2 diabetes mellitus with hyperglycemia: Secondary | ICD-10-CM

## 2020-08-29 MED ORDER — ONETOUCH VERIO VI STRP: ORAL_STRIP | 3 refills | Status: DC

## 2020-09-01 DIAGNOSIS — H524 Presbyopia: Secondary | ICD-10-CM | POA: Diagnosis not present

## 2020-09-01 DIAGNOSIS — E119 Type 2 diabetes mellitus without complications: Secondary | ICD-10-CM | POA: Diagnosis not present

## 2020-09-01 DIAGNOSIS — H5203 Hypermetropia, bilateral: Secondary | ICD-10-CM | POA: Diagnosis not present

## 2020-09-01 DIAGNOSIS — E1136 Type 2 diabetes mellitus with diabetic cataract: Secondary | ICD-10-CM | POA: Diagnosis not present

## 2020-09-01 DIAGNOSIS — H52223 Regular astigmatism, bilateral: Secondary | ICD-10-CM | POA: Diagnosis not present

## 2020-09-01 DIAGNOSIS — H25013 Cortical age-related cataract, bilateral: Secondary | ICD-10-CM | POA: Diagnosis not present

## 2020-09-01 DIAGNOSIS — H35033 Hypertensive retinopathy, bilateral: Secondary | ICD-10-CM | POA: Diagnosis not present

## 2020-09-01 DIAGNOSIS — I10 Essential (primary) hypertension: Secondary | ICD-10-CM | POA: Diagnosis not present

## 2020-09-01 LAB — HM DIABETES EYE EXAM

## 2020-09-08 ENCOUNTER — Other Ambulatory Visit (HOSPITAL_COMMUNITY): Payer: Self-pay | Admitting: Cardiology

## 2020-09-09 ENCOUNTER — Telehealth: Payer: Self-pay | Admitting: Family

## 2020-09-09 ENCOUNTER — Ambulatory Visit
Admission: RE | Admit: 2020-09-09 | Discharge: 2020-09-09 | Disposition: A | Discharge status: Home / Self Care | Payer: HMO | Source: Ambulatory Visit | Attending: Family | Admitting: Family

## 2020-09-09 ENCOUNTER — Other Ambulatory Visit: Payer: Self-pay

## 2020-09-09 DIAGNOSIS — R42 Dizziness and giddiness: Secondary | ICD-10-CM | POA: Diagnosis not present

## 2020-09-09 DIAGNOSIS — R2689 Other abnormalities of gait and mobility: Secondary | ICD-10-CM

## 2020-09-09 MED ORDER — GADOBENATE DIMEGLUMINE 529 MG/ML IV SOLN
12.0000 mL | Freq: Once | INTRAVENOUS | Status: AC | PRN
  Administered 2020-09-09: 12 mL via INTRAVENOUS

## 2020-09-09 NOTE — Telephone Encounter (Signed)
I have called pt and relayed her MR results. She was pleased.   I have asked her how she was feeling.  She stated that she was feeling tired, but she is lying down right now She also state that she has not talk to the other providers yet since she has not seen them yet. She does have an upcoming appointment with both of them.   FYI to Provider

## 2020-09-09 NOTE — Telephone Encounter (Signed)
Please return call to patient to discuss MR results

## 2020-09-14 ENCOUNTER — Encounter: Payer: Self-pay | Admitting: Internal Medicine

## 2020-09-14 DIAGNOSIS — Z794 Long term (current) use of insulin: Secondary | ICD-10-CM

## 2020-09-14 DIAGNOSIS — E1165 Type 2 diabetes mellitus with hyperglycemia: Secondary | ICD-10-CM

## 2020-09-14 MED ORDER — METFORMIN HCL 1000 MG PO TABS: 1000.0000 mg | ORAL_TABLET | Freq: Two times a day (BID) | ORAL | 0 refills | Status: DC

## 2020-09-14 NOTE — Telephone Encounter (Signed)
Rx sent to preferred pharmacy.

## 2020-09-19 NOTE — Progress Notes (Signed)
Patient ID: Margaret Norman, female   DOB: 11/23/1950, 70 y.o.   MRN: [id number]   This visit occurred during the SARS-CoV-2 public health emergency.  Safety protocols were in place, including screening questions prior to the visit, additional usage of staff PPE, and extensive cleaning of exam room while observing appropriate contact time as indicated for disinfecting solutions.   HPI: Margaret Norman is a 70 y.o.-year-old female, returning for follow-up for DM2, dx in 2008, insulin-dependent since 2016, uncontrolled, without long term complications.  Last visit  4 months ago.  Interim history: She continues to have a lot of stress with her grandson who 31, is bipolar, and was laid off from work - now in a home in [place].  Daughter cooks dinner and she may eat late.  Occasionally, she forgets her nighttime medications, but this is not frequent. She has no new complaints today other than some dizziness.  Reviewed HbA1c levels: Lab Results  Component Value Date   HGBA1C 7.0 (A) 05/18/2020   HGBA1C 7.4 (A) 01/14/2020   HGBA1C 6.6 (A) 09/17/2019  03/19/2017: HbA1c calculated from fructosamine is 6.7%! 12/31/2016: HbA1c calculated from fructosamine: 6.9%!  Pt is on: - Metformin 1000 mg 2x a day with meals >> 2000 mg with dinner - Jardiance 25 mg daily before breakfast - Trulicity 1.5 >> 3 mg weekly - Lantus 30 units at night Stopped Actos when started trulicity back. She was on Trulicity but became expensive >> changed to Actos. She was on Invokana >> recurrent UTIs.  Pt checks her sugars once a day: - am: 95-192, 228 >> 78-133, 184 >> 78, 88-136, 142 (forgot medicine) - 2h after b'fast: 113-163, 208 >> 161-190 >> 189-210 >> n/c - before lunch: 199 >> n/c >> 135-215 >> n/c - 2h after lunch: 186- 194 >> 161 >> 176 >> 96 >> 198 >> n/c - before dinner: 163, 168 >> 240 (?) >> 119, 167 >> 108 >> n/c - 2h after dinner: n/c >> 292 >> n/c >> 156 >> n/c - bedtime: 148-167 >> 172, 200 >>  138, 217, 227 >> 129 - nighttime: n/c >> 172 >> 178 >> n/c Lowest sugar was 95 >> 78 >> 60s x1; she has hypoglycemia awareness in the 70s. Highest sugar was 228 >> 227 >> <200.  Glucometer: OneTouch Verio  -No CKD, last BUN/creatinine:  Lab Results  Component Value Date   BUN 17 06/28/2020   BUN 24 (H) 04/20/2020   CREATININE 0.77 06/28/2020   CREATININE 0.92 04/20/2020  On Entresto. -+ HL; last set of lipids: Lab Results  Component Value Date   CHOL 97 06/28/2020   HDL 47 06/28/2020   LDLCALC 37 06/28/2020   LDLDIRECT 137.9 02/04/2013   TRIG 67 06/28/2020   CHOLHDL 2.1 06/28/2020  On Crestor 10. - last eye exam was in 07/2020: No DR, + cataract OS.  Dr. Marica Otter. -+ numbness and tingling in her toes.  She has restless leg syndrome.  Pt has FH of DM in [place].  At last visit, I noticed left thyromegaly.    We checked a thyroid ultrasound (01/26/2020): Parenchymal Echotexture: Moderately heterogenous Isthmus: 0.3 cm Right lobe: 6.2 cm x 2.1 cm x 2.2 cm Left lobe: 7.7 cm x 3.2 cm x 2.8 cm _________________________________________________________  Nodule # 1: Location: Right; Superior Maximum size: 0.9 cm; Other 2 dimensions: 0.7 cm x 0.7 cm Composition: spongiform (0) Spongiform nodule does not meet criteria for surveillance or biopsy _________________________________________________________  Nodule # 2: Location:  Right; Mid Maximum size: 1.0 cm; Other 2 dimensions: 0.9 cm x 0.6 cm Composition: spongiform (0) Spongiform nodule does not meet criteria for surveillance or biopsy _________________________________________________________  Nodule # 3: Location: Right; Mid Maximum size: 1.2 cm; Other 2 dimensions: 1.1 cm x 0.7 cm Composition: spongiform (0) Spongiform nodule does not meet criteria for surveillance or biopsy _________________________________________________________  Nodule # 4: Location: Right; Inferior Maximum size: 1.0 cm; Other 2  dimensions: 0.8 cm x 0.8 cm Composition: spongiform (0) Spongiform nodule does not meet criteria for surveillance or biopsy ________________________________________________________  Nodule # 5: Location: Right; Inferior Maximum size: 1.5 cm; Other 2 dimensions: 1.4 cm x 0.8 cm Composition: solid/almost completely solid (2) Echogenicity: isoechoic (1) Nodule meets criteria for surveillance _________________________________________________________  Nodule # 6: Location: Left; Superior Maximum size: 1.2 cm; Other 2 dimensions: 1.1 cm x 0.9 cm Composition: cannot determine (2) Echogenicity: isoechoic (1) Echogenic foci: macrocalcifications (1) Nodule meets criteria for surveillance _______________________________________________________  Nodule # 7: Location: Left; Inferior Maximum size: 4.7 cm; Other 2 dimensions: 4.7 cm x 2.8 cm Composition: mixed cystic and solid (1) Echogenicity: isoechoic (1) Echogenic foci: macrocalcifications (1) Nodule meets criteria for biopsy ______________________________________________________  No adenopathy  IMPRESSION: Multinodular thyroid.  Left inferior thyroid nodule (labeled 7, TR , 4.7 cm) meets criteria for biopsy, as designated by the newly established ACR TI-RADS criteria, and referral for biopsy is recommended.  Right inferior thyroid nodule (labeled 5, 1.5 cm, TR 3) and the left superior thyroid nodule (labeled 6, 1.2 cm, TR 4) both meet criteria for surveillance, as designated by the newly established ACR TI-RADS criteria. Surveillance ultrasound study recommended to be performed annually up to 5 years.  FNA left inferior thyroid nodule (02/16/2020): Benign  Pt denies: - feeling nodules in neck - hoarseness - dysphagia - choking - SOB with lying down  Latest TSH was normal Lab Results  Component Value Date   TSH 1.167 04/15/2019   She sees Cardiology - Dr. Aundra Dubin >> on [place], [place]. She also has a  history of breast cancer, s/p chemoradiation therapy. She fractured humerus in 02/2020 after tripping >> this is healing.  She had a DXA scan >> normal.  She is on Arimidex.  ROS: Constitutional: no weight gain/weight loss, no fatigue, no subjective hyperthermia, no subjective hypothermia Eyes: no blurry vision, no xerophthalmia ENT: no sore throat, + see HPI Cardiovascular: no CP/no SOB/no palpitations/no leg swelling Respiratory: no cough/no SOB/no wheezing Gastrointestinal: no N/no V/no D/no C/no acid reflux Musculoskeletal: no muscle aches/+ joint aches Skin: no rashes, no hair loss Neurological: no tremors/+ numbness/+ tingling/+ dizziness  I reviewed pt's medications, allergies, PMH, social hx, family hx, and changes were documented in the history of present illness. Otherwise, unchanged from my initial visit note.  Past Medical History:  Diagnosis Date  . Arthritis   . Chest pain    Nuclear, adenosine,  December, 2013, low risk nuclear scan with small, moderate in intensity, fixed anteroseptal defect. This is possibly related to an LBBB versus small prior infarct. No ischemia  . CHF (congestive heart failure) ([place])   . Depression   . Gallstones 11-06  . GERD (gastroesophageal reflux disease)   . History of kidney stones   . HTN (hypertension)   . Hyperlipidemia   . Kidney mass 01/28/2014  . LBBB (left bundle branch block)   . Nephrolithiasis   . Pneumonia   . 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 Type II or unspecified type diabetes mellitus without mention of complication,  not stated as uncontrolled    Past Surgical History:  Procedure Laterality Date  . APPENDECTOMY    . BREAST BIOPSY Left 10/2017  . BREAST BIOPSY Right 03/2019  . BREAST LUMPECTOMY Right 2020  . BREAST LUMPECTOMY WITH RADIOACTIVE SEED AND SENTINEL LYMPH NODE BIOPSY Right 04/15/2019   Procedure: RIGHT BREAST PARTIAL MASTECTOMY WITH RADIOACTIVE SEED AND SENTINEL LYMPH NODE BIOPSY;  Surgeon:  Coralie Keens, MD;  Location: [facility];  Service: General;  Laterality: Right;  . CESAREAN SECTION     x2 ? w/appy  . CHOLECYSTECTOMY    . ESOPHAGOGASTRODUODENOSCOPY    . HERNIA REPAIR    . KNEE ARTHROSCOPY Right 11/2005  . LIVER BIOPSY    . PANCREATIC CYST EXCISION    . PORT-A-CATH REMOVAL Left 04/25/2020   Procedure: REMOVAL PORT-A-CATH;  Surgeon: Coralie Keens, MD;  Location: [facility];  Service: General;  Laterality: Left;  . PORTACATH PLACEMENT Left 04/15/2019   Procedure: INSERTION PORT-A-CATH WITH ULTRASOUND;  Surgeon: Coralie Keens, MD;  Location: [facility];  Service: General;  Laterality: Left;  . RIGHT/LEFT HEART CATH AND CORONARY ANGIOGRAPHY N/A 09/28/2019   Procedure: RIGHT/LEFT HEART CATH AND CORONARY ANGIOGRAPHY;  Surgeon: Larey Dresser, MD;  Location: [facility];  Service: Cardiovascular;  Laterality: N/A;  . SPLENECTOMY    . TONSILLECTOMY AND ADENOIDECTOMY     Social History   Socioeconomic History  . Marital status: Married    Spouse name: Zilla Shartzer  . Number of children: 2  . Years of education: Not on file  . Highest education level: Not on file  Occupational History  . Occupation: CSR    Employer: [employer]  . Occupation: retired  Tobacco Use  . Smoking status: Never Smoker  . Smokeless tobacco: Never Used  Vaping Use  . Vaping Use: Never used  Substance and Sexual Activity  . Alcohol use: No  . Drug use: No  . Sexual activity: Not Currently  Other Topics Concern  . Not on file  Social History Narrative   Regular Exercise -  NO   Social Determinants of Health   Financial Resource Strain: Low Risk   . Difficulty of Paying Living Expenses: Not hard at all  Food Insecurity: No Food Insecurity  . Worried About Charity fundraiser in the Last Year: Never true  . Ran Out of Food in the Last Year: Never true  Transportation Needs: No Transportation Needs  . Lack of Transportation (Medical): No  . Lack of  Transportation (Non-Medical): No  Physical Activity: Sufficiently Active  . Days of Exercise per Week: 5 days  . Minutes of Exercise per Session: 30 min  Stress: No Stress Concern Present  . Feeling of Stress : Not at all  Social Connections: Socially Integrated  . Frequency of Communication with Friends and Family: More than three times a week  . Frequency of Social Gatherings with Friends and Family: More than three times a week  . Attends Religious Services: More than 4 times per year  . Active Member of Clubs or Organizations: Yes  . Attends Archivist Meetings: More than 4 times per year  . Marital Status: Married  Human resources officer Violence: Not At Risk  . Fear of Current or Ex-Partner: No  . Emotionally Abused: No  . Physically Abused: No  . Sexually Abused: No   Current Outpatient Medications on File Prior to Visit  Medication Sig Dispense Refill  . acetaminophen (TYLENOL) 500 MG tablet  Take 1,000 mg by mouth every 6 (six) hours as needed for moderate pain.    Marland Kitchen anastrozole (ARIMIDEX) 1 MG tablet Take 1 tablet (1 mg total) by mouth daily. 90 tablet 3  . aspirin 81 MG EC tablet Take 81 mg by mouth daily.    Marland Kitchen b complex vitamins tablet Take 1 tablet by mouth daily at 12 noon.    . Blood Glucose Monitoring Suppl (ONETOUCH VERIO FLEX SYSTEM) W/DEVICE KIT 1 kit by Does not apply route 3 (three) times daily. 1 kit 1  . buPROPion (WELLBUTRIN XL) 150 MG 24 hr tablet TAKE 1 TABLET BY MOUTH DAILY IN THE MORNING 90 tablet 1  . carvedilol (COREG) 12.5 MG tablet Take 1.5 tablets (18.75 mg total) by mouth 2 (two) times daily. 180 tablet 5  . Cholecalciferol (VITAMIN D) 50 MCG (2000 UT) tablet Take 2,000 Units by mouth daily.    . empagliflozin (JARDIANCE) 25 MG TABS tablet Take 25 mg by mouth daily. 90 tablet 3  . ENTRESTO 49-51 MG TAKE 1 TABLET BY MOUTH TWICE DAILY 60 tablet 3  . fluticasone (FLONASE) 50 MCG/ACT nasal spray Place 2 sprays into both nostrils daily. 9.9 g 0  .  glucose blood (ONETOUCH VERIO) test strip Test three times daily as directed. DX: E11.9 300 each 3  . Insulin Pen Needle 32G X 4 MM MISC 1 each by Other route daily. E11.9; THIS IS THE CORRECT PEN NEEDLE. PLEASE FILL RX AS ORDERED 100 each 3  . LANTUS SOLOSTAR 100 UNIT/ML Solostar Pen INJECT SUBCUTANEOUSLY 30  UNITS AT BEDTIME 30 mL 3  . metFORMIN (GLUCOPHAGE) 1000 MG tablet Take 1 tablet (1,000 mg total) by mouth 2 (two) times daily with a meal. 180 tablet 0  . omeprazole (PRILOSEC) 40 MG capsule TAKE 1 CAPSULE BY MOUTH  DAILY 90 capsule 0  . OneTouch Delica Lancets 16X MISC 1 each by Does not apply route daily. E11.9 100 each 0  . Probiotic Product (PROBIOTIC ADVANCED PO) Take 1 tablet by mouth at bedtime.     . rosuvastatin (CRESTOR) 10 MG tablet TAKE 1 TABLET BY MOUTH DAILY 90 tablet 1  . sertraline (ZOLOFT) 100 MG tablet TAKE 1 TABLET BY MOUTH DAILY 90 tablet 2  . spironolactone (ALDACTONE) 25 MG tablet TAKE 1 TABLET BY MOUTH DAILY 90 tablet 3  . TRULICITY 3 IH/0.3UU SOPN INJECT 3 MG INTO THE SKIN  WEEKLY 6 mL 1  . vitamin B-12 (CYANOCOBALAMIN) 100 MCG tablet Take 100 mcg by mouth daily.    . Vitamin E 180 MG CAPS Take 180 mg by mouth at bedtime.      No current facility-administered medications on file prior to visit.   Allergies  Allergen Reactions  . Invokana [Canagliflozin] Other (See Comments)    UTI's  . Amoxicillin Other (See Comments)    Sore tongue Did it involve swelling of the face/tongue/throat, SOB, or low BP? No Did it involve sudden or severe rash/hives, skin peeling, or any reaction on the inside of your mouth or nose? No Did you need to seek medical attention at a hospital or doctor's office? No When did it last happen?5-10 years ago If all above answers are "NO", may proceed with cephalosporin use.    Family History  Problem Relation Age of Onset  . Liver disease Mother   . Dementia Mother   . Diabetes Mother        borderline  . Coronary artery disease  Father   . Heart attack  Father   . Hypertension Father   . Heart disease Father   . Cancer Other        leukemia  . Stroke Maternal Grandfather   . Hyperlipidemia Brother        Amyloidosis  . Diabetes Maternal Grandmother   . Cancer Paternal Uncle        unknown  . Heart attack Paternal Grandmother   . Heart attack Paternal Uncle   . Hypertension Brother   . Diabetes Daughter        borderline  . Colon cancer Neg Hx     PE: BP 120/80 (BP Location: Right Arm, Patient Position: Sitting, Cuff Size: Normal)   Pulse 83   Ht _0  (1.575 m)   Wt 137 lb 12.8 oz (62.5 kg)   SpO2 97%   BMI 25.20 kg/m   Wt Readings from Last 3 Encounters:  09/20/20 137 lb 12.8 oz (62.5 kg)  08/23/20 134 lb (60.8 kg)  06/28/20 134 lb 3.2 oz (60.9 kg)   Constitutional: Normal weight, in NAD Eyes: PERRLA, EOMI, no exophthalmos ENT: moist mucous membranes, +left palpable thyroid, no cervical lymphadenopathy Cardiovascular: RRR, No MRG Respiratory: CTA B Gastrointestinal: abdomen soft, NT, ND, BS+ Musculoskeletal: no deformities, strength intact in all 4 Skin: moist, warm, no rashes Neurological: no tremor with outstretched hands, DTR normal in all 4  ASSESSMENT: 1. DM2, insulin-dependent, uncontrolled, without long term complications, but with hyperglycemia  2. HL  3.  Multiple thyroid nodules  PLAN:  1. Patient with longstanding, uncontrolled, type 2 diabetes, on oral antidiabetic regimen with Metformin and SGLT2 inhibitor and also on weekly GLP-1 receptor agonist and long-acting insulin, with improved control at last visit, and an HbA1c of 7%.  At that time, sugars were better, almost all at goal in the morning except for the situations in which she was having snacks after dinner:.  Later in the day, sugars were mostly at goal, but occasionally higher, even in the 200s.  We did not change her regimen at that time but I advised her to try not to eat after 7 PM. -At today's visit, sugars are  mostly at goal with occasional exceptions when she eats dinner late or if she forgets her nighttime medications.  We discussed about trying to eat earlier dinners, and she will try to do so.  She had 1 slightly low blood sugar in the 60s, but otherwise no hypoglycemia.  For now, we will continue the current regimen and if the sugars remain controlled, at next visit, we can start decreasing her Lantus dose. - I suggested to:  Patient Instructions  Please continue: - Metformin 2000 mg with dinner - Jardiance 25 mg daily before breakfast - Trulicity 3 mg weekly - Lantus 30 units at night  NO EATING AFTER 7 PM!  Please ask your PCP to check a TSH at next visit.  Please return in 4 months with your sugar log.   - we checked her HbA1c: 6.9% (better) - advised to check sugars at different times of the day - 1-2x a day, rotating check times - advised for yearly eye exams >> she is UTD - return to clinic in 4 months  2. HL -Reviewed latest lipid panel from 06/2020: All fractions at goal: Lab Results  Component Value Date   CHOL 97 06/28/2020   HDL 47 06/28/2020   LDLCALC 37 06/28/2020   LDLDIRECT 137.9 02/04/2013   TRIG 67 06/28/2020   CHOLHDL 2.1 06/28/2020  -She continues  on Crestor 10 without side effects  3.  Thyroid nodules -Left thyroid lobe is enlarged on exam so I ordered a thyroid ultrasound (01/26/2020): She had several nodules of which a left inferior nodule had an indication for biopsy.  She had a biopsy on 02/12/2020 and this was benign.  For 2 other nodules on a follow-up was recommended. -She denies neck compression symptoms -TSH was normal in 03/2019 >> she has appointment with PCP every 3 months-discussed about adding a TSH to her blood work.  If this is not possible, I will check this at next visit. -We will repeat a neck ultrasound at next visit, a year from the previous   Philemon Kingdom, MD PhD [facility]

## 2020-09-20 ENCOUNTER — Ambulatory Visit (INDEPENDENT_AMBULATORY_CARE_PROVIDER_SITE_OTHER): Payer: HMO | Admitting: Internal Medicine

## 2020-09-20 ENCOUNTER — Other Ambulatory Visit: Payer: Self-pay

## 2020-09-20 ENCOUNTER — Encounter: Payer: Self-pay | Admitting: Internal Medicine

## 2020-09-20 VITALS — BP 120/80 | HR 83 | Ht 62.0 in | Wt 137.8 lb

## 2020-09-20 DIAGNOSIS — E1165 Type 2 diabetes mellitus with hyperglycemia: Secondary | ICD-10-CM | POA: Diagnosis not present

## 2020-09-20 DIAGNOSIS — E785 Hyperlipidemia, unspecified: Secondary | ICD-10-CM

## 2020-09-20 DIAGNOSIS — Z794 Long term (current) use of insulin: Secondary | ICD-10-CM

## 2020-09-20 DIAGNOSIS — E042 Nontoxic multinodular goiter: Secondary | ICD-10-CM | POA: Diagnosis not present

## 2020-09-20 LAB — POCT GLYCOSYLATED HEMOGLOBIN (HGB A1C): Hemoglobin A1C: 6.9 % — AB (ref 4.0–5.6)

## 2020-09-20 NOTE — Patient Instructions (Addendum)
Please continue: - Metformin 2000 mg with dinner - Jardiance 25 mg daily before breakfast - Trulicity 3 mg weekly - Lantus 30 units at night  NO EATING AFTER 7 PM!  Please ask your PCP to check a TSH at next visit.  Please return in 4 months with your sugar log.

## 2020-09-29 ENCOUNTER — Encounter: Payer: Self-pay | Admitting: Family

## 2020-10-10 ENCOUNTER — Ambulatory Visit (HOSPITAL_BASED_OUTPATIENT_CLINIC_OR_DEPARTMENT_OTHER)
Admission: RE | Admit: 2020-10-10 | Discharge: 2020-10-10 | Disposition: A | Discharge status: Home / Self Care | Payer: HMO | Source: Ambulatory Visit | Attending: Cardiology | Admitting: Cardiology

## 2020-10-10 ENCOUNTER — Encounter (HOSPITAL_COMMUNITY): Payer: Self-pay | Admitting: Cardiology

## 2020-10-10 ENCOUNTER — Ambulatory Visit (HOSPITAL_COMMUNITY)
Admission: RE | Admit: 2020-10-10 | Discharge: 2020-10-10 | Disposition: A | Discharge status: Home / Self Care | Payer: HMO | Source: Ambulatory Visit | Attending: Family | Admitting: Family

## 2020-10-10 ENCOUNTER — Other Ambulatory Visit: Payer: Self-pay

## 2020-10-10 VITALS — BP 90/58 | HR 79 | Wt 135.6 lb

## 2020-10-10 DIAGNOSIS — Z7982 Long term (current) use of aspirin: Secondary | ICD-10-CM | POA: Insufficient documentation

## 2020-10-10 DIAGNOSIS — Z794 Long term (current) use of insulin: Secondary | ICD-10-CM | POA: Diagnosis not present

## 2020-10-10 DIAGNOSIS — I5022 Chronic systolic (congestive) heart failure: Secondary | ICD-10-CM

## 2020-10-10 DIAGNOSIS — I428 Other cardiomyopathies: Secondary | ICD-10-CM | POA: Insufficient documentation

## 2020-10-10 DIAGNOSIS — Z0189 Encounter for other specified special examinations: Secondary | ICD-10-CM

## 2020-10-10 DIAGNOSIS — E119 Type 2 diabetes mellitus without complications: Secondary | ICD-10-CM | POA: Diagnosis not present

## 2020-10-10 DIAGNOSIS — E042 Nontoxic multinodular goiter: Secondary | ICD-10-CM | POA: Insufficient documentation

## 2020-10-10 DIAGNOSIS — I110 Hypertensive heart disease with heart failure: Secondary | ICD-10-CM | POA: Diagnosis not present

## 2020-10-10 DIAGNOSIS — Z79899 Other long term (current) drug therapy: Secondary | ICD-10-CM | POA: Diagnosis not present

## 2020-10-10 DIAGNOSIS — I447 Left bundle-branch block, unspecified: Secondary | ICD-10-CM | POA: Diagnosis not present

## 2020-10-10 DIAGNOSIS — C50911 Malignant neoplasm of unspecified site of right female breast: Secondary | ICD-10-CM

## 2020-10-10 DIAGNOSIS — C50411 Malignant neoplasm of upper-outer quadrant of right female breast: Secondary | ICD-10-CM

## 2020-10-10 DIAGNOSIS — Z170 Estrogen receptor positive status [ER+]: Secondary | ICD-10-CM

## 2020-10-10 LAB — BASIC METABOLIC PANEL
Anion gap: 10 (ref 5–15)
BUN: 19 mg/dL (ref 8–23)
CO2: 27 mmol/L (ref 22–32)
Calcium: 10.1 mg/dL (ref 8.9–10.3)
Chloride: 105 mmol/L (ref 98–111)
Creatinine, Ser: 0.8 mg/dL (ref 0.44–1.00)
GFR, Estimated: >60 mL/min (ref >60)
Glucose, Bld: 118 mg/dL — ABNORMAL HIGH (ref 70–99)
Potassium: 4.7 mmol/L (ref 3.5–5.1)
Sodium: 142 mmol/L (ref 135–145)

## 2020-10-10 LAB — ECHOCARDIOGRAM COMPLETE
Area-P 1/2: 3.76 cm2
S' Lateral: 3 cm

## 2020-10-10 LAB — TSH: TSH: 1.544 u[IU]/mL (ref 0.350–4.500)

## 2020-10-10 MED ORDER — SPIRONOLACTONE 25 MG PO TABS: 1.0000 | ORAL_TABLET | Freq: Every day | ORAL | 3 refills | Status: DC

## 2020-10-10 MED ORDER — CARVEDILOL 12.5 MG PO TABS: 12.5000 mg | ORAL_TABLET | Freq: Two times a day (BID) | ORAL | 3 refills | Status: DC

## 2020-10-10 NOTE — Progress Notes (Signed)
  Echocardiogram 2D Echocardiogram with 3D has been performed.  Margaret Norman M 10/10/2020, 9:44 AM

## 2020-10-10 NOTE — Progress Notes (Signed)
PCP: [name], FNP Oncology: Dr. Lindi Adie HF cardiology: Dr. Aundra Dubin   70 y.o. with history of DM, chronic LBBB, and breast cancer was referred by Dr. Lindi Adie for workup of fall in EF.  She was diagnosed with right breast cancer, ER+/PR+/HER2+ in 9/20.  She had right lumpectomy in 10/20.  Starting in 11/20, she had been getting 12 cycles of Taxol/Herceptin to be followed by Herceptin alone to complete a year. Echo in 10/20 showed EF 55-60% per report (looked more like 50% on my review, likely due to septal-lateral dyssynchrony).  Her repeat echo for Herceptin screening was done in 1/21, showing EF down to 30-35% with septal-lateral dyssynchrony.  Herceptin has been stopped for now.  She is currently undergoing radiation therapy.   Echo in 3/21 showed that EF remains 30-35% with prominent septal-lateral dyssynchrony.  Patient has a strong family history of heart problems.  Father died from "ruptured aorta."  Sister had heart valve repair, one brother has a pacemaker, and another brother has AL amyloidosis.   LHC/RHC was done in 4/21. This showed normal filling pressures, preserved cardiac output, and no significant CAD. Cardiac MRI in 5/21 showed EF 36% with septal-lateral dyssynchrony, RV EF normal 45%, no LGE.  Echo in 10/21 showed EF 40-45% with septal-lateral dyssynchrony.   Echo was done today and reviewed, EF 45% with septal-lateral dyssynchrony, normal RV.   She returns today for followup of cardiomyopathy.  No dyspnea walking on flat ground.  Fatigues easily in general.  No orthopnea/PND.  No chest pain. Occasional lightheadedness when standing, this is getting bothersome.  BP soft today.  Weight stable.    Labs (1/21): K 3.8, creatinine 0.75 Labs (3/21): K 4.5, creatinine 0.71 Labs (4/21): K 4.3, creatinine 0.82 => 0.83 Labs (8/21): hgb 12.7, K 4.7, creatinine 0.85 Labs (10/21): K 4.2, creatinine 0.92 Labs (1/22): LDL 37, HDL 47, K 4.3, creatinine 0.77  PMH: 1. Depression 2.  Type 2 diabetes 3. Chronic LBBB 4. H/o transient complete heart block: in 10/20 when she was having port-a-cath placed.  5. HTN 6. Hyperlipidemia.  7. Breast cancer: She was diagnosed with right breast cancer, ER+/PR+/HER2+ in 9/20.  She had right lumpectomy in 10/20.  Starting in 11/20, she has been getting 12 cycles of Taxol/Herceptin to be followed by Herceptin alone to complete a year.  8. Chronic systolic CHF: She has had mildly decreased EF in the past, possibly related to LBBB.  Nonischemic cardiomyopathy.  - Echo (4/17): EF 50-55% - Cardiolite (4/17): EF 48% - Echo (10/20): EF 55-60% (reviewed, think more like 50% but technically difficult).  - Echo (1/21): EF 30-35%, mild LVH, septal dyssynchrony.  - Echo (3/21): EF 30-35% with prominent septal-lateral dyssynchrony.  - LHC/RHC (4/21): mean RA 1, PA 13/3, mean PCWP 2, CI 3.74, no significant CAD.  - Cardiac MRI (5/21): EF 36% with septal-lateral dyssynchrony, RV EF normal 45%, no LGE.  - Echo (10/21): EF 40-45%, septal-lateral dyssynchrony.  - Echo (4/22): EF 45% with septal-lateral dyssynchrony, normal RV.  Social History   Socioeconomic History  . Marital status: Married    Spouse name: Harleen Fineberg  . Number of children: 2  . Years of education: Not on file  . Highest education level: Not on file  Occupational History  . Occupation: CSR    Employer: [employer]  . Occupation: retired  Tobacco Use  . Smoking status: Never Smoker  . Smokeless tobacco: Never Used  Vaping Use  . Vaping Use: Never used  Substance and Sexual Activity  . Alcohol use: No  . Drug use: No  . Sexual activity: Not Currently  Other Topics Concern  . Not on file  Social History Narrative   Regular Exercise -  NO   Social Determinants of Health   Financial Resource Strain: Low Risk   . Difficulty of Paying Living Expenses: Not hard at all  Food Insecurity: No Food Insecurity  . Worried About Charity fundraiser in the Last Year:  Never true  . Ran Out of Food in the Last Year: Never true  Transportation Needs: No Transportation Needs  . Lack of Transportation (Medical): No  . Lack of Transportation (Non-Medical): No  Physical Activity: Sufficiently Active  . Days of Exercise per Week: 5 days  . Minutes of Exercise per Session: 30 min  Stress: No Stress Concern Present  . Feeling of Stress : Not at all  Social Connections: Socially Integrated  . Frequency of Communication with Friends and Family: More than three times a week  . Frequency of Social Gatherings with Friends and Family: More than three times a week  . Attends Religious Services: More than 4 times per year  . Active Member of Clubs or Organizations: Yes  . Attends Archivist Meetings: More than 4 times per year  . Marital Status: Married  Human resources officer Violence: Not At Risk  . Fear of Current or Ex-Partner: No  . Emotionally Abused: No  . Physically Abused: No  . Sexually Abused: No   Family History  Problem Relation Age of Onset  . Liver disease Mother   . Dementia Mother   . Diabetes Mother        borderline  . Coronary artery disease Father   . Heart attack Father   . Hypertension Father   . Heart disease Father   . Cancer Other        leukemia  . Stroke Maternal Grandfather   . Hyperlipidemia Brother        Amyloidosis  . Diabetes Maternal Grandmother   . Cancer Paternal Uncle        unknown  . Heart attack Paternal Grandmother   . Heart attack Paternal Uncle   . Hypertension Brother   . Diabetes Daughter        borderline  . Colon cancer Neg Hx    Current Outpatient Medications  Medication Sig Dispense Refill  . acetaminophen (TYLENOL) 500 MG tablet Take 1,000 mg by mouth every 6 (six) hours as needed for moderate pain.    Marland Kitchen anastrozole (ARIMIDEX) 1 MG tablet Take 1 tablet (1 mg total) by mouth daily. 90 tablet 3  . aspirin 81 MG EC tablet Take 81 mg by mouth daily.    . Blood Glucose Monitoring Suppl (ONETOUCH  VERIO FLEX SYSTEM) W/DEVICE KIT 1 kit by Does not apply route 3 (three) times daily. 1 kit 1  . buPROPion (WELLBUTRIN XL) 150 MG 24 hr tablet TAKE 1 TABLET BY MOUTH DAILY IN THE MORNING 90 tablet 1  . Cholecalciferol (VITAMIN D) 50 MCG (2000 UT) tablet Take 2,000 Units by mouth daily.    . empagliflozin (JARDIANCE) 25 MG TABS tablet Take 25 mg by mouth daily. 90 tablet 3  . ENTRESTO 49-51 MG TAKE 1 TABLET BY MOUTH TWICE DAILY 60 tablet 3  . fluticasone (FLONASE) 50 MCG/ACT nasal spray Place 2 sprays into both nostrils daily. 9.9 g 0  . glucose blood (ONETOUCH VERIO) test strip Test three times daily  as directed. DX: E11.9 300 each 3  . Insulin Pen Needle 32G X 4 MM MISC 1 each by Other route daily. E11.9; THIS IS THE CORRECT PEN NEEDLE. PLEASE FILL RX AS ORDERED 100 each 3  . LANTUS SOLOSTAR 100 UNIT/ML Solostar Pen INJECT SUBCUTANEOUSLY 30  UNITS AT BEDTIME 30 mL 3  . metFORMIN (GLUCOPHAGE) 1000 MG tablet Take 1 tablet (1,000 mg total) by mouth 2 (two) times daily with a meal. 180 tablet 0  . omeprazole (PRILOSEC) 40 MG capsule TAKE 1 CAPSULE BY MOUTH  DAILY 90 capsule 0  . OneTouch Delica Lancets 38H MISC 1 each by Does not apply route daily. E11.9 100 each 0  . Probiotic Product (PROBIOTIC ADVANCED PO) Take 1 tablet by mouth at bedtime.     . rosuvastatin (CRESTOR) 10 MG tablet TAKE 1 TABLET BY MOUTH DAILY 90 tablet 1  . sertraline (ZOLOFT) 100 MG tablet TAKE 1 TABLET BY MOUTH DAILY 90 tablet 2  . TRULICITY 3 WE/9.9BZ SOPN INJECT 3 MG INTO THE SKIN  WEEKLY 6 mL 1  . vitamin B-12 (CYANOCOBALAMIN) 100 MCG tablet Take 100 mcg by mouth daily.    . Vitamin E 180 MG CAPS Take 180 mg by mouth at bedtime.     . carvedilol (COREG) 12.5 MG tablet Take 1 tablet (12.5 mg total) by mouth 2 (two) times daily. 180 tablet 3  . spironolactone (ALDACTONE) 25 MG tablet Take 1 tablet (25 mg total) by mouth at bedtime. 90 tablet 3   No current facility-administered medications for this encounter.   BP (!) 90/58    Pulse 79   Wt 61.5 kg (135 lb 9.6 oz)   SpO2 99%   BMI 24.80 kg/m  General: NAD Neck: No JVD, no thyromegaly or thyroid nodule.  Lungs: Clear to auscultation bilaterally with normal respiratory effort. CV: Nondisplaced PMI.  Heart regular S1/S2, no S3/S4, no murmur.  No peripheral edema.  No carotid bruit.  Normal pedal pulses.  Abdomen: Soft, nontender, no hepatosplenomegaly, no distention.  Skin: Intact without lesions or rashes.  Neurologic: Alert and oriented x 3.  Psych: Normal affect. Extremities: No clubbing or cyanosis.  HEENT: Normal.   Assessment/Plan: 1. Chronic systolic CHF:  Nonischemic cardiomyopathy.  Echo in 1/21 showed EF down to 30-35% with septal-lateral dyssynchrony.  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  Based on the timeline, the most likely cause for fall in EF seems to be Herceptin use.  She is now off Herceptin, but echo in 3/21 showed EF still in the 30-35% range.  LHC/RHC in 4/21 showed low filling pressures, preserved cardiac output, and no significant CAD.  Cardiac MRI in 5/21 showed EF 36%, no LGE (no evidence for infiltrative disease). Of note, brother died of sudden death so also consider familial cardiomyopathy.  Echo in 10/21 showed EF up a bit to 40-45% with dyssynchrony due to LBBB.  Echo today with EF 45%, septal-lateral dyssynchrony. NYHA class II symptoms.  She is not volume overloaded by exam.  BP soft and has orthostatic symptoms.  - Continue Entresto 49/51 bid. BMET today.  - Decrease Coreg back to 12.5 mg bid.  - Continue spironolactone 25 mg daily.  - Continue Jardiance.  - She will not be getting any more Herceptin.   - EF is now out of range for CRT-D.  2. Transient CHB: Noted in the past after  port-a-cath placement, no episodes recently.  - Follow closely with use of Coreg.   3. LBBB: Chronic.  ?LBBB cardiomyopathy.   4. Breast cancer: She will not get further Herceptin.    Followup 6 months.    Loralie Champagne 10/10/2020

## 2020-10-10 NOTE — Patient Instructions (Addendum)
Labs done today. We will contact you only if your labs are abnormal.  DECREASE Carvedilol (Coreg) to 12.5mg  (1 tablet) by mouth 2 times daily.  START taking Spironolactone daily at bedtime.  No other medication changes were made. Please continue all current medications as prescribed.  Your physician recommends that you schedule a follow-up appointment in: 6 months. Please contact our office in September to schedule an October appointment.   If you have any questions or concerns before your next appointment please send [place] a message through [place] or call our office at [phone].    TO LEAVE A MESSAGE FOR THE NURSE SELECT OPTION 2, PLEASE LEAVE A MESSAGE INCLUDING: . YOUR NAME . DATE OF BIRTH . CALL BACK NUMBER . REASON FOR CALL**this is important as we prioritize the call backs  YOU WILL RECEIVE A CALL BACK THE SAME DAY AS LONG AS YOU CALL BEFORE 4:00 PM   Do the following things EVERYDAY: 1) Weigh yourself in the morning before breakfast. Write it down and keep it in a log. 2) Take your medicines as prescribed 3) Eat low salt foods--Limit salt (sodium) to 2000 mg per day.  4) Stay as active as you can everyday 5) Limit all fluids for the day to less than 2 liters   At the [facility], you and your health needs are our priority. As part of our continuing mission to provide you with exceptional heart care, we have created designated Provider Care Teams. These Care Teams include your primary Cardiologist (physician) and Advanced Practice Providers (APPs- Physician Assistants and Nurse Practitioners) who all work together to provide you with the care you need, when you need it.   You may see any of the following providers on your designated Care Team at your next follow up: Marland Kitchen Dr Glori Bickers . Dr Loralie Champagne . Darrick Grinder, NP . Lyda Jester, PA . Audry Riles, PharmD   Please be sure to bring in all your medications bottles to every appointment.

## 2020-10-11 ENCOUNTER — Ambulatory Visit: Payer: HMO | Admitting: Physician Assistant

## 2020-10-11 ENCOUNTER — Encounter: Payer: Self-pay | Admitting: Physician Assistant

## 2020-10-11 VITALS — BP 90/60 | HR 96 | Ht 61.75 in | Wt 137.0 lb

## 2020-10-11 DIAGNOSIS — K5909 Other constipation: Secondary | ICD-10-CM

## 2020-10-11 DIAGNOSIS — R110 Nausea: Secondary | ICD-10-CM | POA: Diagnosis not present

## 2020-10-11 DIAGNOSIS — G8929 Other chronic pain: Secondary | ICD-10-CM

## 2020-10-11 DIAGNOSIS — R1013 Epigastric pain: Secondary | ICD-10-CM | POA: Diagnosis not present

## 2020-10-11 MED ORDER — OMEPRAZOLE 40 MG PO CPDR: 1.0000 | DELAYED_RELEASE_CAPSULE | Freq: Two times a day (BID) | ORAL | 3 refills | Status: DC

## 2020-10-11 NOTE — Progress Notes (Signed)
Chief Complaint: Abdominal pain, nausea, constipation  HPI:    Margaret Norman is a 70 year old Caucasian female, known to Dr. Loletha Carrow, with a past medical history as listed below including reflux, CHF and breast cancer, who was referred to me by [name],* for a complaint of abdominal pain, nausea and constipation.      09/17/2017 patient seen in clinic by Dr. Loletha Carrow for reflux and upper abdominal pain.  She had seen Dr. 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  She also described upper abdominal pain and bloating with Dr. Deatra Ina saw her.  At time of that visit described for the past couple months she had postprandial bloating and a dull pain that seem worse after meals with no nausea, vomiting or dysphagia.  At that time it was suspected she had a gastric motility disorder such as impaired accommodation or perhaps mild delayed gastric emptying.  She was given dietary advice.  It was not thought that she needed gastric emptying study or trial of metoclopramide given she did not have chronic nausea or vomiting or weight loss and she was on medications that were possibly causing this which she could not stop.    Today, patient explains that she does have a chronic amount of epigastric pain but over the past couple of weeks this has seemed to worsen.  Sometimes if she takes a Rolaids or Tums or tries to go to sleep this pain will go away, but sometimes it will last for days at a time.  Tells me it typically occurs after eating something, within a few hours and then varies in duration.  Last time this occurred was after she ate a [employer] hot dog and the pain lasted for 2 days.  Almost feels like gas which seems to move from her epigastrium over to the left upper side and then move lower.  Continues Omeprazole 40 mg once daily which does help with reflux symptoms.  Describes pain as a burning rated as a 6-12/2008.   Associated symptoms include some nausea and a decrease in appetite during times of severe pain.    Also describes chronic constipation, she has tried a stool softener with laxative in the past occasionally but this does not always work.  Typically only has a bowel movement once every 4 to 5 days or may even go longer.    Denies fever, chills, blood in her stool, weight loss or symptoms that awaken her from sleep.  Past Medical History:  Diagnosis Date  . Arthritis   . Chest pain    Nuclear, adenosine,  December, 2013, low risk nuclear scan with small, moderate in intensity, fixed anteroseptal defect. This is possibly related to an LBBB versus small prior infarct. No ischemia  . CHF (congestive heart failure) ([place])   . Depression   . Gallstones 11-06  . GERD (gastroesophageal reflux disease)   . History of kidney stones   . HTN (hypertension)   . Hyperlipidemia   . Kidney mass 01/28/2014  . LBBB (left bundle branch block)   . Nephrolithiasis   . Pneumonia   . 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 Type II or unspecified type diabetes mellitus without mention of complication, not stated as uncontrolled     Past Surgical History:  Procedure Laterality Date  . APPENDECTOMY    . BREAST BIOPSY Left 10/2017  . BREAST BIOPSY Right 03/2019  . BREAST LUMPECTOMY Right  2020  . BREAST LUMPECTOMY WITH RADIOACTIVE SEED AND SENTINEL LYMPH NODE BIOPSY Right 04/15/2019   Procedure: RIGHT BREAST PARTIAL MASTECTOMY WITH RADIOACTIVE SEED AND SENTINEL LYMPH NODE BIOPSY;  Surgeon: Coralie Keens, MD;  Location: [place];  Service: General;  Laterality: Right;  . CESAREAN SECTION     x2 ? w/appy  . CHOLECYSTECTOMY    . ESOPHAGOGASTRODUODENOSCOPY    . HERNIA REPAIR    . KNEE ARTHROSCOPY Right 11/2005  . LIVER BIOPSY    . PANCREATIC CYST EXCISION    . PORT-A-CATH REMOVAL Left 04/25/2020   Procedure: REMOVAL PORT-A-CATH;  Surgeon: Coralie Keens, MD;  Location: [facility];   Service: General;  Laterality: Left;  . PORTACATH PLACEMENT Left 04/15/2019   Procedure: INSERTION PORT-A-CATH WITH ULTRASOUND;  Surgeon: Coralie Keens, MD;  Location: [facility];  Service: General;  Laterality: Left;  . RIGHT/LEFT HEART CATH AND CORONARY ANGIOGRAPHY N/A 09/28/2019   Procedure: RIGHT/LEFT HEART CATH AND CORONARY ANGIOGRAPHY;  Surgeon: Larey Dresser, MD;  Location: [facility];  Service: Cardiovascular;  Laterality: N/A;  . SPLENECTOMY    . TONSILLECTOMY AND ADENOIDECTOMY      Current Outpatient Medications  Medication Sig Dispense Refill  . acetaminophen (TYLENOL) 500 MG tablet Take 1,000 mg by mouth every 6 (six) hours as needed for moderate pain.    Marland Kitchen anastrozole (ARIMIDEX) 1 MG tablet Take 1 tablet (1 mg total) by mouth daily. 90 tablet 3  . aspirin 81 MG EC tablet Take 81 mg by mouth daily.    . Blood Glucose Monitoring Suppl (ONETOUCH VERIO FLEX SYSTEM) W/DEVICE KIT 1 kit by Does not apply route 3 (three) times daily. 1 kit 1  . buPROPion (WELLBUTRIN XL) 150 MG 24 hr tablet TAKE 1 TABLET BY MOUTH DAILY IN THE MORNING 90 tablet 1  . carvedilol (COREG) 12.5 MG tablet Take 1 tablet (12.5 mg total) by mouth 2 (two) times daily. 180 tablet 3  . Cholecalciferol (VITAMIN D) 50 MCG (2000 UT) tablet Take 2,000 Units by mouth daily.    . empagliflozin (JARDIANCE) 25 MG TABS tablet Take 25 mg by mouth daily. 90 tablet 3  . ENTRESTO 49-51 MG TAKE 1 TABLET BY MOUTH TWICE DAILY 60 tablet 3  . fluticasone (FLONASE) 50 MCG/ACT nasal spray Place 2 sprays into both nostrils daily. 9.9 g 0  . glucose blood (ONETOUCH VERIO) test strip Test three times daily as directed. DX: E11.9 300 each 3  . Insulin Pen Needle 32G X 4 MM MISC 1 each by Other route daily. E11.9; THIS IS THE CORRECT PEN NEEDLE. PLEASE FILL RX AS ORDERED 100 each 3  . LANTUS SOLOSTAR 100 UNIT/ML Solostar Pen INJECT SUBCUTANEOUSLY 30  UNITS AT BEDTIME 30 mL 3  . metFORMIN (GLUCOPHAGE) 1000 MG tablet Take 1 tablet  (1,000 mg total) by mouth 2 (two) times daily with a meal. 180 tablet 0  . omeprazole (PRILOSEC) 40 MG capsule TAKE 1 CAPSULE BY MOUTH  DAILY 90 capsule 0  . OneTouch Delica Lancets 35T MISC 1 each by Does not apply route daily. E11.9 100 each 0  . Probiotic Product (PROBIOTIC ADVANCED PO) Take 1 tablet by mouth at bedtime.     . rosuvastatin (CRESTOR) 10 MG tablet TAKE 1 TABLET BY MOUTH DAILY 90 tablet 1  . sertraline (ZOLOFT) 100 MG tablet TAKE 1 TABLET BY MOUTH DAILY 90 tablet 2  . spironolactone (ALDACTONE) 25 MG tablet Take 1 tablet (25 mg total) by mouth at bedtime. [place]  tablet 3  . TRULICITY 3 RJ/1.8AC SOPN INJECT 3 MG INTO THE SKIN  WEEKLY 6 mL 1  . vitamin B-12 (CYANOCOBALAMIN) 100 MCG tablet Take 100 mcg by mouth daily.    . Vitamin E 180 MG CAPS Take 180 mg by mouth at bedtime.      No current facility-administered medications for this visit.    Allergies as of 10/11/2020 - Review Complete 10/11/2020  Allergen Reaction Noted  . Invokana [canagliflozin] Other (See Comments) 08/01/2015  . Amoxicillin Other (See Comments) 07/14/2012    Family History  Problem Relation Age of Onset  . Liver disease Mother   . Dementia Mother   . Diabetes Mother        borderline  . Coronary artery disease Father   . Heart attack Father   . Hypertension Father   . Heart disease Father   . Cancer Other        leukemia  . Stroke Maternal Grandfather   . Hyperlipidemia Brother        Amyloidosis  . Diabetes Maternal Grandmother   . Cancer Paternal Uncle        unknown  . Heart attack Paternal Grandmother   . Heart attack Paternal Uncle   . Hypertension Brother   . Diabetes Daughter        borderline  . Colon cancer Neg Hx     Social History   Socioeconomic History  . Marital status: Married    Spouse name: Dunia Pringle  . Number of children: 2  . Years of education: Not on file  . Highest education level: Not on file  Occupational History  . Occupation: CSR    Employer: [employer]  . Occupation: retired  Tobacco Use  . Smoking status: Never Smoker  . Smokeless tobacco: Never Used  Vaping Use  . Vaping Use: Never used  Substance and Sexual Activity  . Alcohol use: No  . Drug use: No  . Sexual activity: Not Currently  Other Topics Concern  . Not on file  Social History Narrative   Regular Exercise -  NO   Social Determinants of Health   Financial Resource Strain: Low Risk   . Difficulty of Paying Living Expenses: Not hard at all  Food Insecurity: No Food Insecurity  . Worried About Charity fundraiser in the Last Year: Never true  . Ran Out of Food in the Last Year: Never true  Transportation Needs: No Transportation Needs  . Lack of Transportation (Medical): No  . Lack of Transportation (Non-Medical): No  Physical Activity: Sufficiently Active  . Days of Exercise per Week: 5 days  . Minutes of Exercise per Session: 30 min  Stress: No Stress Concern Present  . Feeling of Stress : Not at all  Social Connections: Socially Integrated  . Frequency of Communication with Friends and Family: More than three times a week  . Frequency of Social Gatherings with Friends and Family: More than three times a week  . Attends Religious Services: More than 4 times per year  . Active Member of Clubs or Organizations: Yes  . Attends Archivist Meetings: More than 4 times per year  . Marital Status: Married  Human resources officer Violence: Not At Risk  . Fear of Current or Ex-Partner: No  . Emotionally Abused: No  . Physically Abused: No  . Sexually Abused: No    Review of Systems:    Constitutional: No weight loss, fever, chills, weakness or fatigue  Skin: No rash  Cardiovascular: No chest pain Respiratory: No SOB Gastrointestinal: See HPI and otherwise negative Genitourinary: No dysuria  Neurological: No headache, dizziness or syncope Musculoskeletal: No new muscle or joint pain Hematologic: No bleeding  Psychiatric: No history of depression  or anxiety   Physical Exam:  Vital signs: BP 90/60 (BP Location: Left Arm, Patient Position: Sitting, Cuff Size: Normal)   Pulse 96   Ht 5' 1.75" (1.568 m)   Wt 137 lb (62.1 kg)   BMI 25.26 kg/m   Constitutional:   Pleasant Caucasian female appears to be in NAD, Well developed, Well nourished, alert and cooperative Head:  Normocephalic and atraumatic. Eyes:   PEERL, EOMI. No icterus. Conjunctiva pink. Ears:  Normal auditory acuity. Neck:  Supple Throat: Oral cavity and pharynx without inflammation, swelling or lesion.  Respiratory: Respirations even and unlabored. Lungs clear to auscultation bilaterally.   No wheezes, crackles, or rhonchi.  Cardiovascular: Normal S1, S2. No MRG. Regular rate and rhythm. No peripheral edema, cyanosis or pallor.  Gastrointestinal:  Soft, nondistended, moderate epigastric ttp. No rebound or guarding. Normal bowel sounds. No appreciable masses or hepatomegaly. Rectal:  Not performed.  Msk:  Symmetrical without gross deformities. Without edema, no deformity or joint abnormality.  Neurologic:  Alert and  oriented x4;  grossly normal neurologically.  Skin:   Dry and intact without significant lesions or rashes. Psychiatric:  Demonstrates good judgement and reason without abnormal affect or behaviors.  RELEVANT LABS AND IMAGING: CBC    Component Value Date/Time   WBC 14.6 (H) 02/17/2020 1019   RBC 4.30 02/17/2020 1019   HGB 12.7 02/17/2020 1019   HGB 10.8 (L) 07/29/2019 0726   HGB 11.5 (L) 01/08/2014 1121   HCT 39.0 02/17/2020 1019   HCT 36.1 01/08/2014 1121   PLT 485 (H) 02/17/2020 1019   PLT 540 (H) 07/29/2019 0726   PLT 692 (H) 01/08/2014 1121   MCV 90.7 02/17/2020 1019   MCV 87.2 01/08/2014 1121   MCH 29.5 02/17/2020 1019   MCHC 32.6 02/17/2020 1019   RDW 13.2 02/17/2020 1019   RDW 15.5 (H) 01/08/2014 1121   LYMPHSABS 3,927 (H) 02/17/2020 1019   LYMPHSABS 7.4 (H) 01/08/2014 1121   MONOABS 1.0 07/29/2019 0726   MONOABS 1.4 (H) 01/08/2014  1121   EOSABS 248 02/17/2020 1019   EOSABS 0.3 01/08/2014 1121   BASOSABS 73 02/17/2020 1019   BASOSABS 0.0 01/08/2014 1121    CMP     Component Value Date/Time   NA 142 10/10/2020 1012   NA 146 (H) 05/19/2018 0843   K 4.7 10/10/2020 1012   CL 105 10/10/2020 1012   CO2 27 10/10/2020 1012   GLUCOSE 118 (H) 10/10/2020 1012   BUN 19 10/10/2020 1012   BUN 19 05/19/2018 0843   CREATININE 0.80 10/10/2020 1012   CREATININE 0.85 02/17/2020 1019   CALCIUM 10.1 10/10/2020 1012   PROT 6.5 02/17/2020 1019   PROT 6.7 05/19/2018 0843   ALBUMIN 3.8 07/29/2019 0726   ALBUMIN 4.6 05/19/2018 0843   AST 11 02/17/2020 1019   AST 17 07/29/2019 0726   ALT 9 02/17/2020 1019   ALT 19 07/29/2019 0726   ALKPHOS 56 07/29/2019 0726   BILITOT 0.3 02/17/2020 1019   BILITOT 0.3 07/29/2019 0726   GFRNONAA >60 10/10/2020 1012   GFRNONAA >60 07/29/2019 0726   GFRNONAA 61 10/09/2017 1022   GFRAA >60 02/01/2020 0940   GFRAA >60 07/29/2019 0726   GFRAA 71 10/09/2017 1022  Assessment: 1.  Acute on chronic epigastric pain: Thought to possibly be related to gastroparesis in the past, but this pain does not always come with nausea, worse over the past couple of weeks and described as a burning; consider gastritis+/-gastroparesis versus other 2.  Nausea: With above 3.  Chronic constipation: No better with a stool softener plus stimulant laxative  Plan: 1.  Scheduled patient for diagnostic EGD in the [facility] with Dr. Loletha Carrow.  Did provide the patient with a detailed list risks for procedure and she agrees to proceed. 2.  Increase Omeprazole to 40 mg twice daily, 30-60 minutes before breakfast and dinner prescribed #60 with 5 refills. 3.  Reviewed antireflux diet lifestyle modifications. 4.  Recommend the patient use MiraLAX on a daily basis.  Discussed titration of this up to 4 times a day if necessary. 5.  Patient to follow in clinic per recommendations from Dr. Loletha Carrow after time of procedure.  Ellouise Newer,  PA-C [facility] 10/11/2020, 11:10 AM  Cc: [name],*

## 2020-10-11 NOTE — Patient Instructions (Signed)
If you are age 71 or older, your body mass index should be between 23-30. Your Body mass index is 25.26 kg/m. If this is out of the aforementioned range listed, please consider follow up with your Primary Care Provider.  If you are age 45 or younger, your body mass index should be between 19-25. Your Body mass index is 25.26 kg/m. If this is out of the aformentioned range listed, please consider follow up with your Primary Care Provider.   Increase Pantoprazole to twice daily .  Try Miralax once daily.  It was a pleasure to see you today!  Ellouise Newer, PA-C

## 2020-10-12 ENCOUNTER — Other Ambulatory Visit: Payer: Self-pay | Admitting: Internal Medicine

## 2020-10-12 ENCOUNTER — Other Ambulatory Visit (HOSPITAL_COMMUNITY): Payer: Self-pay | Admitting: Cardiology

## 2020-10-13 ENCOUNTER — Encounter: Payer: Self-pay | Admitting: Family

## 2020-10-13 NOTE — Telephone Encounter (Signed)
Ok to forward new address to pt please

## 2020-10-17 ENCOUNTER — Other Ambulatory Visit: Payer: Self-pay | Admitting: Family

## 2020-10-18 NOTE — Progress Notes (Signed)
____________________________________________________________  Attending physician addendum:  Thank you for sending this case to me. I have reviewed the entire note and agree with the plan.   Syna Gad Danis, MD  ____________________________________________________________  

## 2020-10-19 ENCOUNTER — Encounter: Payer: Self-pay | Admitting: Gastroenterology

## 2020-10-19 ENCOUNTER — Ambulatory Visit (AMBULATORY_SURGERY_CENTER): Payer: HMO | Admitting: Gastroenterology

## 2020-10-19 ENCOUNTER — Other Ambulatory Visit: Payer: Self-pay | Admitting: Gastroenterology

## 2020-10-19 ENCOUNTER — Other Ambulatory Visit: Payer: Self-pay

## 2020-10-19 VITALS — BP 118/62 | HR 80 | Temp 97.2°F | Resp 14 | Ht 61.0 in | Wt 137.0 lb

## 2020-10-19 DIAGNOSIS — K2970 Gastritis, unspecified, without bleeding: Secondary | ICD-10-CM | POA: Diagnosis not present

## 2020-10-19 DIAGNOSIS — G8929 Other chronic pain: Secondary | ICD-10-CM

## 2020-10-19 DIAGNOSIS — R1013 Epigastric pain: Secondary | ICD-10-CM | POA: Diagnosis not present

## 2020-10-19 DIAGNOSIS — K2990 Gastroduodenitis, unspecified, without bleeding: Secondary | ICD-10-CM

## 2020-10-19 DIAGNOSIS — K317 Polyp of stomach and duodenum: Secondary | ICD-10-CM | POA: Diagnosis not present

## 2020-10-19 DIAGNOSIS — K319 Disease of stomach and duodenum, unspecified: Secondary | ICD-10-CM | POA: Diagnosis not present

## 2020-10-19 MED ORDER — SODIUM CHLORIDE 0.9 % IV SOLN: 500.0000 mL | Freq: Once | INTRAVENOUS | Status: DC

## 2020-10-19 NOTE — Progress Notes (Signed)
Vital signs checked by:CW ? ?The medical and surgical history was reviewed and verified with the patient. ? ?

## 2020-10-19 NOTE — Patient Instructions (Signed)
Discharge instructions given. Handout on Gastritis. Resume previous medications. Return to office at appointment to be scheduled. YOU HAD AN ENDOSCOPIC PROCEDURE TODAY AT [facility]:   Refer to the procedure report that was given to you for any specific questions about what was found during the examination.  If the procedure report does not answer your questions, please call your gastroenterologist to clarify.  If you requested that your care partner not be given the details of your procedure findings, then the procedure report has been included in a sealed envelope for you to review at your convenience later.  YOU SHOULD EXPECT: Some feelings of bloating in the abdomen. Passage of more gas than usual.  Walking can help get rid of the air that was put into your GI tract during the procedure and reduce the bloating. If you had a lower endoscopy (such as a colonoscopy or flexible sigmoidoscopy) you may notice spotting of blood in your stool or on the toilet paper. If you underwent a bowel prep for your procedure, you may not have a normal bowel movement for a few days.  Please Note:  You might notice some irritation and congestion in your nose or some drainage.  This is from the oxygen used during your procedure.  There is no need for concern and it should clear up in a day or so.  SYMPTOMS TO REPORT IMMEDIATELY:    Following upper endoscopy (EGD)  Vomiting of blood or coffee ground material  New chest pain or pain under the shoulder blades  Painful or persistently difficult swallowing  New shortness of breath  Fever of 100F or higher  Black, tarry-looking stools  For urgent or emergent issues, a gastroenterologist can be reached at any hour by calling [phone]. Do not use MyChart messaging for urgent concerns.    DIET:  We do recommend a small meal at first, but then you may proceed to your regular diet.  Drink plenty of fluids but you should avoid alcoholic  beverages for 24 hours.  ACTIVITY:  You should plan to take it easy for the rest of today and you should NOT DRIVE or use heavy machinery until tomorrow (because of the sedation medicines used during the test).    FOLLOW UP: Our staff will call the number listed on your records 48-72 hours following your procedure to check on you and address any questions or concerns that you may have regarding the information given to you following your procedure. If we do not reach you, we will leave a message.  We will attempt to reach you two times.  During this call, we will ask if you have developed any symptoms of COVID 19. If you develop any symptoms (ie: fever, flu-like symptoms, shortness of breath, cough etc.) before then, please call [phone].  If you test positive for Covid 19 in the 2 weeks post procedure, please call and report this information to [place].    If any biopsies were taken you will be contacted by phone or by letter within the next 1-3 weeks.  Please call us at [phone] if you have not heard about the biopsies in 3 weeks.    SIGNATURES/CONFIDENTIALITY: You and/or your care partner have signed paperwork which will be entered into your electronic medical record.  These signatures attest to the fact that that the information above on your After Visit Summary has been reviewed and is understood.  Full responsibility of the confidentiality of this discharge information lies with  you and/or your care-partner.

## 2020-10-19 NOTE — Op Note (Signed)
[place] Patient Name: Margaret Norman Procedure Date: 10/19/2020 11:29 AM MRN: [id number] Endoscopist: Mallie Mussel L. Loletha Carrow , MD Age: 70 Referring MD:  Date of Birth: 11-04-1950 Gender: Female Account #: [id number] Procedure:                Upper GI endoscopy Indications:              Epigastric abdominal pain (chronic for years,                            lately worse), bloating Medicines:                Monitored Anesthesia Care Procedure:                Pre-Anesthesia Assessment:                           - Prior to the procedure, a History and Physical                            was performed, and patient medications and                            allergies were reviewed. The patient's tolerance of                            previous anesthesia was also reviewed. The risks                            and benefits of the procedure and the sedation                            options and risks were discussed with the patient.                            All questions were answered, and informed consent                            was obtained. Prior Anticoagulants: The patient has                            taken no previous anticoagulant or antiplatelet                            agents. ASA Grade Assessment: II - A patient with                            mild systemic disease. After reviewing the risks                            and benefits, the patient was deemed in                            satisfactory condition to undergo the procedure.  After obtaining informed consent, the endoscope was                            passed under direct vision. Throughout the                            procedure, the patient's blood pressure, pulse, and                            oxygen saturations were monitored continuously. The                            Endoscope was introduced through the mouth, and                            advanced to the second part of  duodenum. The upper                            GI endoscopy was accomplished without difficulty.                            The patient tolerated the procedure well. Scope In: Scope Out: Findings:                 The larynx was normal.                           The esophagus was normal.                           Multiple small sessile fundic gland polyps were                            found in the gastric fundus and in the gastric body.                           Patchy erythematous mucosa without bleeding was                            found in the gastric body and in the gastric                            antrum. Several biopsies were obtained in the                            gastric body and in the gastric antrum with cold                            forceps for histology.                           The cardia and gastric fundus were normal on                            retroflexion. The  stomach distended well with air.                           The examined duodenum was normal. Complications:            No immediate complications. Estimated Blood Loss:     Estimated blood loss was minimal. Impression:               - Normal larynx.                           - Normal esophagus.                           - Multiple fundic gland polyps.                           - Erythematous mucosa in the gastric body and                            antrum.                           - Normal examined duodenum.                           - Several biopsies were obtained in the gastric                            body and in the gastric antrum.                           If biopsies negative for H pylori, the overall                            clinical picture suggests functional dyspepsia and                            possible side effect of diabetic medicines. Recommendation:           - Patient has a contact number available for                            emergencies. The signs and symptoms of  potential                            delayed complications were discussed with the                            patient. Return to normal activities tomorrow.                            Written discharge instructions were provided to the                            patient.                           -  Resume previous diet.                           - Continue present medications.                           - Await pathology results.                           - Return to my office at appointment to be                            scheduled. Miqueas Whilden L. Loletha Carrow, MD 10/19/2020 11:52:24 AM This report has been signed electronically.

## 2020-10-19 NOTE — Progress Notes (Signed)
Called to room to assist during endoscopic procedure.  Patient ID and intended procedure confirmed with present staff. Received instructions for my participation in the procedure from the performing physician.  

## 2020-10-19 NOTE — Progress Notes (Signed)
Pt Drowsy. VSS. To PACU, report to RN. No anesthetic complications noted.  

## 2020-10-20 ENCOUNTER — Telehealth: Payer: Self-pay

## 2020-10-20 NOTE — Telephone Encounter (Signed)
Per 10/19/20 procedure note - Return to office at appointment to be scheduled.  Patient has been scheduled for a follow up with Dr. Loletha Carrow on Wednesday, 11/09/20 at 1:20 PM. Letter sent via my chart and mailed to patient with appointment information.

## 2020-10-21 ENCOUNTER — Telehealth: Payer: Self-pay | Admitting: *Deleted

## 2020-10-21 NOTE — Telephone Encounter (Signed)
  Follow up Call-  Call back number 10/19/2020  Post procedure Call Back phone  # [phone]  Permission to leave phone message Yes  Some recent data might be hidden     Patient questions:  Do you have a fever, pain , or abdominal swelling? No. Pain Score  0 *  Have you tolerated food without any problems? Yes.    Have you been able to return to your normal activities? Yes.    Do you have any questions about your discharge instructions: Diet   No. Medications  No. Follow up visit  No.  Do you have questions or concerns about your Care? No.  Actions: * If pain score is 4 or above: No action needed, pain <4.  1. Have you developed a fever since your procedure? no  2.   Have you had an respiratory symptoms (SOB or cough) since your procedure? no  3.   Have you tested positive for COVID 19 since your procedure no  4.   Have you had any family members/close contacts diagnosed with the COVID 19 since your procedure?  no   If yes to any of these questions please route to Joylene John, RN and Joella Prince, RN

## 2020-11-09 ENCOUNTER — Encounter: Payer: Self-pay | Admitting: Gastroenterology

## 2020-11-09 ENCOUNTER — Ambulatory Visit: Payer: HMO | Admitting: Gastroenterology

## 2020-11-09 VITALS — BP 90/60 | HR 88 | Ht 61.0 in | Wt 136.6 lb

## 2020-11-09 DIAGNOSIS — K30 Functional dyspepsia: Secondary | ICD-10-CM | POA: Diagnosis not present

## 2020-11-09 NOTE — Patient Instructions (Signed)
If you are age 71 or older, your body mass index should be between 23-30. Your Body mass index is 25.81 kg/m. If this is out of the aforementioned range listed, please consider follow up with your Primary Care Provider.  If you are age 75 or younger, your body mass index should be between 19-25. Your Body mass index is 25.81 kg/m. If this is out of the aformentioned range listed, please consider follow up with your Primary Care Provider.   It was a pleasure to see you today!  Thank you for trusting me with your gastrointestinal care!

## 2020-11-09 NOTE — Progress Notes (Signed)
[facility] Progress Note  Chief Complaint: Upper abdominal pain and bloating  Subjective  History: Kahleah saw me in March 2019 for chronic postprandial bloating and dull epigastric discomfort.  My impression was an upper GI dysmotility/functional dyspepsia, and there may have been some contribution from her GLP-1 agonist (Trulicity). Seen in clinic April 19 for worsening of these chronic symptoms, bloating gas, burning discomfort.  Chronic constipation, sometimes 4 to 5 days between BMs  EGD 10/19/2020 essentially unremarkable except for some mild patchy gastric erythema and fundic gland polyps.  Lakishia continues to have intermittent upper abdominal pain, sometimes more toward the right with associated bloating and gas.  Since her recent procedure, her stools have been thinner and less frequent.  Appetite generally good, she tends to be able to tolerate small portions better  ROS: Cardiovascular:  no chest pain Respiratory: no dyspnea  The patient's Past Medical, Family and Social History were reviewed and are on file in the EMR.  Objective:  Med list reviewed  Current Outpatient Medications:  .  acetaminophen (TYLENOL) 500 MG tablet, Take 1,000 mg by mouth every 6 (six) hours as needed for moderate pain., Disp: , Rfl:  .  anastrozole (ARIMIDEX) 1 MG tablet, Take 1 tablet (1 mg total) by mouth daily., Disp: 90 tablet, Rfl: 3 .  aspirin 81 MG EC tablet, Take 81 mg by mouth daily., Disp: , Rfl:  .  Blood Glucose Monitoring Suppl (ONETOUCH VERIO FLEX SYSTEM) W/DEVICE KIT, 1 kit by Does not apply route 3 (three) times daily., Disp: 1 kit, Rfl: 1 .  buPROPion (WELLBUTRIN XL) 150 MG 24 hr tablet, TAKE 1 TABLET BY MOUTH DAILY IN THE MORNING, Disp: 90 tablet, Rfl: 1 .  carvedilol (COREG) 12.5 MG tablet, Take 1 tablet (12.5 mg total) by mouth 2 (two) times daily., Disp: 180 tablet, Rfl: 3 .  Cholecalciferol (VITAMIN D) 50 MCG (2000 UT) tablet, Take 2,000 Units by mouth daily., Disp:  , Rfl:  .  ENTRESTO 49-51 MG, TAKE 1 TABLET BY MOUTH TWICE DAILY, Disp: 180 tablet, Rfl: 3 .  fluticasone (FLONASE) 50 MCG/ACT nasal spray, Place 2 sprays into both nostrils daily., Disp: 9.9 g, Rfl: 0 .  glucose blood (ONETOUCH VERIO) test strip, Test three times daily as directed. DX: E11.9, Disp: 300 each, Rfl: 3 .  Insulin Pen Needle 32G X 4 MM MISC, 1 each by Other route daily. E11.9; THIS IS THE CORRECT PEN NEEDLE. PLEASE FILL RX AS ORDERED, Disp: 100 each, Rfl: 3 .  JARDIANCE 25 MG TABS tablet, TAKE 1 TABLET BY MOUTH DAILY, Disp: 90 tablet, Rfl: 3 .  LANTUS SOLOSTAR 100 UNIT/ML Solostar Pen, INJECT SUBCUTANEOUSLY 30  UNITS AT BEDTIME, Disp: 30 mL, Rfl: 3 .  metFORMIN (GLUCOPHAGE) 1000 MG tablet, Take 1 tablet (1,000 mg total) by mouth 2 (two) times daily with a meal., Disp: 180 tablet, Rfl: 0 .  omeprazole (PRILOSEC) 40 MG capsule, Take 1 capsule (40 mg total) by mouth 2 (two) times daily. Take one tablet 30-60 minutes before breakfast and dinner., Disp: 60 capsule, Rfl: 3 .  OneTouch Delica Lancets 75T MISC, 1 each by Does not apply route daily. E11.9, Disp: 100 each, Rfl: 0 .  Probiotic Product (PROBIOTIC ADVANCED PO), Take 1 tablet by mouth at bedtime. , Disp: , Rfl:  .  rosuvastatin (CRESTOR) 10 MG tablet, TAKE 1 TABLET BY MOUTH DAILY, Disp: 90 tablet, Rfl: 1 .  sertraline (ZOLOFT) 100 MG tablet, TAKE 1 TABLET BY MOUTH DAILY,  Disp: 90 tablet, Rfl: 2 .  spironolactone (ALDACTONE) 25 MG tablet, Take 1 tablet (25 mg total) by mouth at bedtime., Disp: 90 tablet, Rfl: 3 .  TRULICITY 3 RW/4.1JS SOPN, INJECT 3 MG INTO THE SKIN  WEEKLY, Disp: 6 mL, Rfl: 1 .  vitamin B-12 (CYANOCOBALAMIN) 100 MCG tablet, Take 100 mcg by mouth daily., Disp: , Rfl:  .  Vitamin E 180 MG CAPS, Take 180 mg by mouth at bedtime. , Disp: , Rfl:    Vital signs in last 24 hrs: Vitals:   11/09/20 1310  BP: 90/60  Pulse: 88   Wt Readings from Last 3 Encounters:  11/09/20 136 lb 9.6 oz (62 kg)  10/19/20 137 lb (62.1  kg)  10/11/20 137 lb (62.1 kg)    Physical Exam  Well-appearing  HEENT: sclera anicteric, oral mucosa moist without lesions  Neck: supple, no thyromegaly, JVD or lymphadenopathy  Cardiac: RRR without murmurs, S1S2 heard, no peripheral edema  Pulm: clear to auscultation bilaterally, normal RR and effort noted  Abdomen: soft, no tenderness, with active bowel sounds. No guarding or palpable hepatosplenomegaly.  Labs: Most recent hemoglobin A1c 6.9  ___________________________________________ Radiologic studies:   ____________________________________________ Other: 1. Surgical [P], gastric antrum - REACTIVE GASTROPATHY - NO H. PYLORI OR INTESTINAL METAPLASIA IDENTIFIED - SEE COMMENT 2. Surgical [P], gastric body - BENIGN GASTRIC MUCOSA - NO H. PYLORI, INTESTINAL METAPLASIA OR MALIGNANCY IDENTIFIED  _____________________________________________ Assessment & Plan  Assessment: Encounter Diagnosis  Name Primary?  . 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 Her GLP-1 agonist may be contributing as well.  Requires small food portions, reasonable moderate dietary fiber, generally avoiding raw cruciferous vegetables.  Some written dietary advice was given regarding bloating and gas.  No additional testing or treatment at present.  See me as needed.  20 minutes were spent on this encounter (including chart review, history/exam, counseling/coordination of care, and documentation) > 50% of that time was spent on counseling and coordination of care.    Nelida Meuse III

## 2020-11-17 ENCOUNTER — Other Ambulatory Visit: Payer: Self-pay | Admitting: Hematology and Oncology

## 2020-12-21 ENCOUNTER — Encounter: Payer: Self-pay | Admitting: Internal Medicine

## 2020-12-21 DIAGNOSIS — M1711 Unilateral primary osteoarthritis, right knee: Secondary | ICD-10-CM | POA: Diagnosis not present

## 2020-12-21 DIAGNOSIS — M25561 Pain in right knee: Secondary | ICD-10-CM | POA: Diagnosis not present

## 2020-12-22 ENCOUNTER — Other Ambulatory Visit: Payer: Self-pay

## 2020-12-22 DIAGNOSIS — Z794 Long term (current) use of insulin: Secondary | ICD-10-CM

## 2020-12-22 MED ORDER — INSULIN PEN NEEDLE 32G X 4 MM MISC: 1.0000 | Freq: Every day | 3 refills | Status: DC

## 2021-01-10 ENCOUNTER — Encounter: Payer: Self-pay | Admitting: Internal Medicine

## 2021-01-10 ENCOUNTER — Ambulatory Visit (INDEPENDENT_AMBULATORY_CARE_PROVIDER_SITE_OTHER): Payer: HMO | Admitting: Internal Medicine

## 2021-01-10 ENCOUNTER — Other Ambulatory Visit: Payer: Self-pay

## 2021-01-10 VITALS — BP 110/70 | HR 81 | Ht 61.0 in | Wt 135.2 lb

## 2021-01-10 DIAGNOSIS — E785 Hyperlipidemia, unspecified: Secondary | ICD-10-CM | POA: Diagnosis not present

## 2021-01-10 DIAGNOSIS — E1165 Type 2 diabetes mellitus with hyperglycemia: Secondary | ICD-10-CM | POA: Diagnosis not present

## 2021-01-10 DIAGNOSIS — Z794 Long term (current) use of insulin: Secondary | ICD-10-CM | POA: Diagnosis not present

## 2021-01-10 DIAGNOSIS — E042 Nontoxic multinodular goiter: Secondary | ICD-10-CM

## 2021-01-10 DIAGNOSIS — E118 Type 2 diabetes mellitus with unspecified complications: Secondary | ICD-10-CM

## 2021-01-10 LAB — POCT GLYCOSYLATED HEMOGLOBIN (HGB A1C): Hemoglobin A1C: 7 % — AB (ref 4.0–5.6)

## 2021-01-10 MED ORDER — LANTUS SOLOSTAR 100 UNIT/ML ~~LOC~~ SOPN: PEN_INJECTOR | SUBCUTANEOUS | 3 refills | Status: DC

## 2021-01-10 NOTE — Patient Instructions (Signed)
Please continue: - Metformin 2000 mg with dinner - Jardiance 25 mg daily before breakfast - Trulicity 3 mg weekly - Lantus 30 units at night  NO EATING AFTER 7 PM!  Please return in 4 months with your sugar log.

## 2021-01-10 NOTE — Progress Notes (Signed)
Patient ID: Margaret Norman, female   DOB: Nov 09, 1950, 70 y.o.   MRN: [id number]   This visit occurred during the SARS-CoV-2 public health emergency.  Safety protocols were in place, including screening questions prior to the visit, additional usage of staff PPE, and extensive cleaning of exam room while observing appropriate contact time as indicated for disinfecting solutions.   HPI: Margaret Norman is a 70 y.o.-year-old female, returning for follow-up for DM2, dx in 2008, insulin-dependent since 2016, uncontrolled, without long term complications.  Last visit  8 months ago.  Interim history:  Daughter cooks dinner and she continues to eat late.  However, they will move out next month and she will try to start eating dinners earlier. No increased urination, blurry vision, nausea, chest pain. She has dizziness, disequilibrium. She has intermittent AP after eating larger meals. EGD nonrevealing.   Reviewed HbA1c levels: Lab Results  Component Value Date   HGBA1C 6.9 (A) 09/20/2020   HGBA1C 7.0 (A) 05/18/2020   HGBA1C 7.4 (A) 01/14/2020  03/19/2017: HbA1c calculated from fructosamine is 6.7%! 12/31/2016: HbA1c calculated from fructosamine: 6.9%!  Pt is on: - Metformin 1000 mg 2x a day with meals >> 2000 mg with dinner - Jardiance 25 mg daily before breakfast - Trulicity 1.5 >> 3 mg weekly - Lantus 30 units at night Stopped Actos when started trulicity back. She was on Trulicity but became expensive >> changed to Actos. She was on Invokana >> recurrent UTIs.  Pt checks her sugars once a day: - am: 78-133, 184 >> 78, 88-136, 142 (forgot medicine) >> 83-132, 157 - 2h after b'fast: 113-163, 208 >> 161-190 >> 189-210 >> n/c >> 94-132 - before lunch: 199 >> n/c >> 135-215 >> n/c - 2h after lunch: 186- 194 >> 161 >> 176 >> 96 >> 198 >> n/c >> 177 - before dinner: 163, 168 >> 240 (?) >> 119, 167 >> 108 >> n/c - 2h after dinner: n/c >> 292 >> n/c >> 156 >> n/c >> 112 - bedtime: 148-167 >>  172, 200 >> 138, 217, 227 >> 129 >> 141 - nighttime: n/c >> 172 >> 178 >> n/c Lowest sugar was 95 >> 78 >> 60s x1; >> 83 she has hypoglycemia awareness in the 70s. Highest sugar was 228 >> 227 >> <200 >> 177.  Glucometer: OneTouch Verio  -No CKD, last BUN/creatinine:  Lab Results  Component Value Date   BUN 19 10/10/2020   BUN 17 06/28/2020   CREATININE 0.80 10/10/2020   CREATININE 0.77 06/28/2020  On Entresto.  -+ HL; last set of lipids: Lab Results  Component Value Date   CHOL 97 06/28/2020   HDL 47 06/28/2020   LDLCALC 37 06/28/2020   LDLDIRECT 137.9 02/04/2013   TRIG 67 06/28/2020   CHOLHDL 2.1 06/28/2020  On Crestor 10.  - last eye exam was in 07/2020: No DR, + cataract OS.  Dr. Marica Otter.  -+ numbness and tingling in her toes.  She has restless leg syndrome.  Pt has FH of DM in [place].  Multinodular goiter:   -Detected on palpation in 2021  Thyroid ultrasound (01/26/2020): Parenchymal Echotexture: Moderately heterogenous Isthmus: 0.3 cm Right lobe: 6.2 cm x 2.1 cm x 2.2 cm Left lobe: 7.7 cm x 3.2 cm x 2.8 cm _________________________________________________________   Nodule # 1: Location: Right; Superior Maximum size: 0.9 cm; Other 2 dimensions: 0.7 cm x 0.7 cm Composition: spongiform (0) Spongiform nodule does not meet criteria for surveillance or biopsy  _________________________________________________________  Nodule # 2: Location: Right; Mid Maximum size: 1.0 cm; Other 2 dimensions: 0.9 cm x 0.6 cm Composition: spongiform (0) Spongiform nodule does not meet criteria for surveillance or biopsy  _________________________________________________________   Nodule # 3:  Location: Right; Mid Maximum size: 1.2 cm; Other 2 dimensions: 1.1 cm x 0.7 cm Composition: spongiform (0) Spongiform nodule does not meet criteria for surveillance or biopsy _________________________________________________________   Nodule # 4:  Location: Right;  Inferior Maximum size: 1.0 cm; Other 2 dimensions: 0.8 cm x 0.8 cm Composition: spongiform (0) Spongiform nodule does not meet criteria for surveillance or biopsy  ________________________________________________________   Nodule # 5:  Location: Right; Inferior Maximum size: 1.5 cm; Other 2 dimensions: 1.4 cm x 0.8 cm Composition: solid/almost completely solid (2) Echogenicity: isoechoic (1) Nodule meets criteria for surveillance  _________________________________________________________   Nodule # 6: Location: Left; Superior Maximum size: 1.2 cm; Other 2 dimensions: 1.1 cm x 0.9 cm  Composition: cannot determine (2) Echogenicity: isoechoic (1) Echogenic foci: macrocalcifications (1)  Nodule meets criteria for surveillance  _______________________________________________________   Nodule # 7: Location: Left; Inferior Maximum size: 4.7 cm; Other 2 dimensions: 4.7 cm x 2.8 cm Composition: mixed cystic and solid (1) Echogenicity: isoechoic (1) Echogenic foci: macrocalcifications (1) Nodule meets criteria for biopsy  ______________________________________________________   No adenopathy   IMPRESSION: Multinodular thyroid.   Left inferior thyroid nodule (labeled 7, TR , 4.7 cm) meets criteria for biopsy, as designated by the newly established ACR TI-RADS criteria, and referral for biopsy is recommended.   Right inferior thyroid nodule (labeled 5, 1.5 cm, TR 3) and the left superior thyroid nodule (labeled 6, 1.2 cm, TR 4) both meet criteria for surveillance, as designated by the newly established ACR TI-RADS criteria. Surveillance ultrasound study recommended to be performed annually up to 5 years.   FNA left inferior thyroid nodule (02/16/2020): Benign  Pt denies: - feeling nodules in neck - hoarseness - dysphagia - choking - SOB with lying down  Latest TSH was normal Lab Results  Component Value Date   TSH 1.544 10/10/2020   She sees Cardiology - Dr. Aundra Dubin  >> on [place], [place]. She also has a history of breast cancer, s/p chemoradiation therapy. She fractured humerus in 02/2020 after tripping >> this is healing.  She had a DXA scan >> normal.  She is on Arimidex.  ROS: + See HPI Constitutional: no weight gain/weight loss, no fatigue, no subjective hyperthermia, no subjective hypothermia Eyes: no blurry vision, no xerophthalmia ENT: no sore throat, + see HPI Cardiovascular: no CP/no SOB/no palpitations/no leg swelling Respiratory: no cough/no SOB/no wheezing Gastrointestinal: no N/no V/no D/no C/no acid reflux Musculoskeletal: no muscle aches/joint aches Skin: no rashes, no hair loss Neurological: no tremors/+ numbness/+ tingling/+ dizziness  I reviewed pt's medications, allergies, PMH, social hx, family hx, and changes were documented in the history of present illness. Otherwise, unchanged from my initial visit note.  Past Medical History:  Diagnosis Date   Arthritis    Chest pain    Nuclear, adenosine,  December, 2013, low risk nuclear scan with small, moderate in intensity, fixed anteroseptal defect. This is possibly related to an LBBB versus small prior infarct. 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 w/appy   CHOLECYSTECTOMY     ESOPHAGOGASTRODUODENOSCOPY     HERNIA REPAIR     KNEE ARTHROSCOPY Right 11/2005   LIVER BIOPSY     PANCREATIC CYST EXCISION     PORT-A-CATH REMOVAL Left 04/25/2020   Procedure: REMOVAL PORT-A-CATH;  Surgeon: Coralie Keens, MD;  Location: [facility];  Service: General;  Laterality: Left;   PORTACATH PLACEMENT Left 04/15/2019   Procedure: INSERTION PORT-A-CATH WITH ULTRASOUND;  Surgeon: Coralie Keens, MD;  Location: [facility];  Service: General;  Laterality: Left;   RIGHT/LEFT HEART CATH AND CORONARY ANGIOGRAPHY N/A 09/28/2019   Procedure: RIGHT/LEFT HEART CATH AND CORONARY ANGIOGRAPHY;  Surgeon: Larey Dresser, MD;  Location: [facility];  Service: Cardiovascular;  Laterality: N/A;   SPLENECTOMY     TONSILLECTOMY AND ADENOIDECTOMY     Social History   Socioeconomic History   Marital status: Married    Spouse name: Lachae Hohler   Number of children: 2   Years of education: Not on file   Highest education level: Not on file  Occupational History   Occupation: CSR    Employer: [employer]   Occupation: retired  Tobacco Use   Smoking status: Never   Smokeless tobacco: Never  Vaping Use   Vaping Use: Never used  Substance and Sexual Activity   Alcohol use: No   Drug use: No   Sexual activity: Not Currently  Other Topics Concern   Not on file  Social History Narrative   Regular Exercise -  NO   Social Determinants of Health   Financial Resource Strain: Low Risk    Difficulty of Paying Living Expenses: Not hard at all  Food Insecurity: No Food Insecurity   Worried About Charity fundraiser in the Last Year: Never true   [place] in the Last Year: Never true  Transportation Needs: No Transportation Needs   Lack of Transportation (Medical): No   Lack of Transportation  (Non-Medical): No  Physical Activity: Sufficiently Active   Days of Exercise per Week: 5 days   Minutes of Exercise per Session: 30 min  Stress: No Stress Concern Present   Feeling of Stress : Not at all  Social Connections: Socially Integrated   Frequency of Communication with Friends and Family: More than three times a week   Frequency of Social Gatherings with Friends and Family: More than three times a week   Attends Religious Services: More than 4 times per year   Active Member of [employer] or Organizations: Yes   Attends Music therapist: More than 4 times per year   Marital Status: Married  Human resources officer Violence: Not At Risk   Fear of Current or Ex-Partner: No   Emotionally Abused: No   Physically Abused: No   Sexually Abused:  No   Current Outpatient Medications on File Prior to Visit  Medication Sig Dispense Refill   acetaminophen (TYLENOL) 500 MG tablet Take 1,000 mg by mouth every 6 (six) hours as needed for moderate pain.     anastrozole (ARIMIDEX) 1 MG tablet TAKE 1 TABLET BY MOUTH DAILY 90 tablet 3   aspirin 81 MG EC tablet Take 81 mg by mouth daily.     Blood Glucose Monitoring Suppl (ONETOUCH VERIO FLEX SYSTEM) W/DEVICE KIT 1 kit by Does not apply route 3 (three) times daily. 1 kit 1   buPROPion (WELLBUTRIN XL) 150 MG 24 hr tablet TAKE 1 TABLET BY MOUTH DAILY IN THE MORNING 90 tablet 1   carvedilol (COREG) 12.5 MG tablet Take 1 tablet (12.5 mg total) by mouth 2 (two) times daily. 180 tablet 3   Cholecalciferol (VITAMIN D) 50 MCG (2000 UT) tablet Take 2,000 Units by mouth daily.     ENTRESTO 49-51 MG TAKE 1 TABLET BY MOUTH TWICE DAILY 180 tablet 3   fluticasone (FLONASE) 50 MCG/ACT nasal spray Place 2 sprays into both nostrils daily. 9.9 g 0   glucose blood (ONETOUCH VERIO) test strip Test three times daily as directed. DX: E11.9 300 each 3   Insulin Pen Needle 32G X 4 MM MISC 1 each by Other route daily. E11.9; THIS IS THE CORRECT PEN NEEDLE. PLEASE FILL RX  AS ORDERED 100 each 3   JARDIANCE 25 MG TABS tablet TAKE 1 TABLET BY MOUTH DAILY 90 tablet 3   LANTUS SOLOSTAR 100 UNIT/ML Solostar Pen INJECT SUBCUTANEOUSLY 30  UNITS AT BEDTIME 30 mL 3   metFORMIN (GLUCOPHAGE) 1000 MG tablet Take 1 tablet (1,000 mg total) by mouth 2 (two) times daily with a meal. 180 tablet 0   omeprazole (PRILOSEC) 40 MG capsule Take 1 capsule (40 mg total) by mouth 2 (two) times daily. Take one tablet 30-60 minutes before breakfast and dinner. 60 capsule 3   OneTouch Delica Lancets 16X MISC 1 each by Does not apply route daily. E11.9 100 each 0   Probiotic Product (PROBIOTIC ADVANCED PO) Take 1 tablet by mouth at bedtime.      rosuvastatin (CRESTOR) 10 MG tablet TAKE 1 TABLET BY MOUTH DAILY 90 tablet 1   sertraline (ZOLOFT) 100 MG tablet TAKE 1 TABLET BY MOUTH DAILY 90 tablet 2   spironolactone (ALDACTONE) 25 MG tablet Take 1 tablet (25 mg total) by mouth at bedtime. 90 tablet 3   TRULICITY 3 WR/6.0AV SOPN INJECT 3 MG INTO THE SKIN  WEEKLY 6 mL 1   vitamin B-12 (CYANOCOBALAMIN) 100 MCG tablet Take 100 mcg by mouth daily.     Vitamin E 180 MG CAPS Take 180 mg by mouth at bedtime.      No current facility-administered medications on file prior to visit.   Allergies  Allergen Reactions   Invokana [Canagliflozin] Other (See Comments)    UTI's   Amoxicillin Other (See Comments)    Sore tongue Did it involve swelling of the face/tongue/throat, SOB, or low BP? No Did it involve sudden or severe rash/hives, skin peeling, or any reaction on the inside of your mouth or nose? No Did you need to seek medical attention at a hospital or doctor's office? No When did it last happen?      5-10 years ago If all above answers are "NO", may proceed with cephalosporin use.    Family History  Problem Relation Age of Onset   Liver disease Mother    Dementia  Mother    Diabetes Mother        borderline   Coronary artery disease Father    Heart attack Father    Hypertension Father     Heart disease Father    Cancer Other        leukemia   Stroke Maternal Grandfather    Hyperlipidemia Brother        Amyloidosis   Diabetes Maternal Grandmother    Cancer Paternal Uncle        unknown   Heart attack Paternal Grandmother    Heart attack Paternal Uncle    Hypertension Brother    Diabetes Daughter        borderline   Colon cancer Neg Hx    Esophageal cancer Neg Hx    Rectal cancer Neg Hx    Stomach cancer Neg Hx     PE: BP 110/70 (BP Location: Right Arm, Patient Position: Sitting, Cuff Size: Normal)   Pulse 81   Ht 5' 1" (1.549 m)   Wt 135 lb 3.2 oz (61.3 kg)   SpO2 97%   BMI 25.55 kg/m   Wt Readings from Last 3 Encounters:  01/10/21 135 lb 3.2 oz (61.3 kg)  11/09/20 136 lb 9.6 oz (62 kg)  10/19/20 137 lb (62.1 kg)   Constitutional: Normal weight, in NAD Eyes: PERRLA, EOMI, no exophthalmos ENT: moist mucous membranes, + full thyroid on palpation (L>R lobe), no cervical lymphadenopathy Cardiovascular: RRR, No MRG Respiratory: CTA B Gastrointestinal: abdomen soft, NT, ND, BS+ Musculoskeletal: no deformities, strength intact in all 4 Skin: moist, warm, no rashes Neurological: no tremor with outstretched hands, DTR normal in all 4  ASSESSMENT: 1. DM2, insulin-dependent, uncontrolled, without long term complications, but with hyperglycemia  2. HL  3.  Multiple thyroid nodules  PLAN:  1. Patient with longstanding, uncontrolled, type 2 diabetes, on oral antidiabetic regimen with metformin and SGLT2 inhibitor, also weekly GLP-1 receptor agonist and daily long-acting insulin, with improving control.  At last visit, sugars were mostly at goal with occasional hyperglycemic exceptions when she was eating dinner late or if she forgot her nighttime medications.  We discussed about trying to eat earlier dinners but we otherwise did not change her regimen.  HbA1c at that time was 7.0% but since then she had another HbA1c 4 months ago and this was improved, at  6.9%. -At today's visit, sugars remain controlled in the last month.  She only occasionally has mild hyperglycemia but no hypoglycemia.  She is tolerating her regimen well.  She does have occasional abdominal pain after eating larger meals and we discussed that this is possible with Trulicity.  Discussed about eating quantitatively smaller meals, if possible. - I suggested to:  Patient Instructions  Please continue: - Metformin 2000 mg with dinner - Jardiance 25 mg daily before breakfast - Trulicity 3 mg weekly - Lantus 30 units at night  NO EATING AFTER 7 PM!  Please return in 4 months with your sugar log.   - we checked her HbA1c: 7.0% - slightly higher, but still at goal - advised to check sugars at different times of the day - 1-2x a day, rotating check times - advised for yearly eye exams >> she is UTD - return to clinic in 3-4 months  2. HL -Reviewed latest lipid panel from 06/2020 and all fractions were at goal: Lab Results  Component Value Date   CHOL 97 06/28/2020   HDL 47 06/28/2020   LDLCALC 37 06/28/2020  LDLDIRECT 137.9 02/04/2013   TRIG 67 06/28/2020   CHOLHDL 2.1 06/28/2020  -She continues on Crestor 10 mg daily without side effects  3.  Thyroid nodules -Left thyroid lobe was enlarged on exam so we checked a thyroid ultrasound in 01/2020.  She had several nodules, of which a left inferior nodule had an indication for biopsy.  She had a biopsy and this was benign.  For 2 of the other nodules, a follow-up was indicated in a year -No neck compression symptoms -TSH was normal at last check in 09/2020 -We will repeat a thyroid ultrasound now  Orders Placed This Encounter  Procedures   US THYROID   POCT glycosylated hemoglobin (Hb A1C)   Philemon Kingdom, MD PhD [facility] Endocrinology

## 2021-01-11 ENCOUNTER — Other Ambulatory Visit: Payer: Self-pay | Admitting: Internal Medicine

## 2021-01-11 DIAGNOSIS — E1165 Type 2 diabetes mellitus with hyperglycemia: Secondary | ICD-10-CM

## 2021-02-07 ENCOUNTER — Ambulatory Visit
Admission: RE | Admit: 2021-02-07 | Discharge: 2021-02-07 | Disposition: A | Discharge status: Home / Self Care | Payer: HMO | Source: Ambulatory Visit | Attending: Internal Medicine | Admitting: Internal Medicine

## 2021-02-07 DIAGNOSIS — E042 Nontoxic multinodular goiter: Secondary | ICD-10-CM

## 2021-02-14 ENCOUNTER — Other Ambulatory Visit: Payer: Self-pay | Admitting: Family

## 2021-03-03 IMAGING — MG MM PLC BREAST LOC DEV 1ST LESION INC*R*
8 of 9 series · 8 of 9 positions shown · non-contrast
Comparison: Previous exam(s).

CLINICAL DATA: The patient presents for seed localization of the
RIGHT breast prior to lumpectomy for previously biopsied grade 2
invasive ductal carcinoma.

EXAM:
MAMMOGRAPHIC GUIDED RADIOACTIVE SEED LOCALIZATION OF THE RIGHT
BREAST

[R LM (1 of 4)]
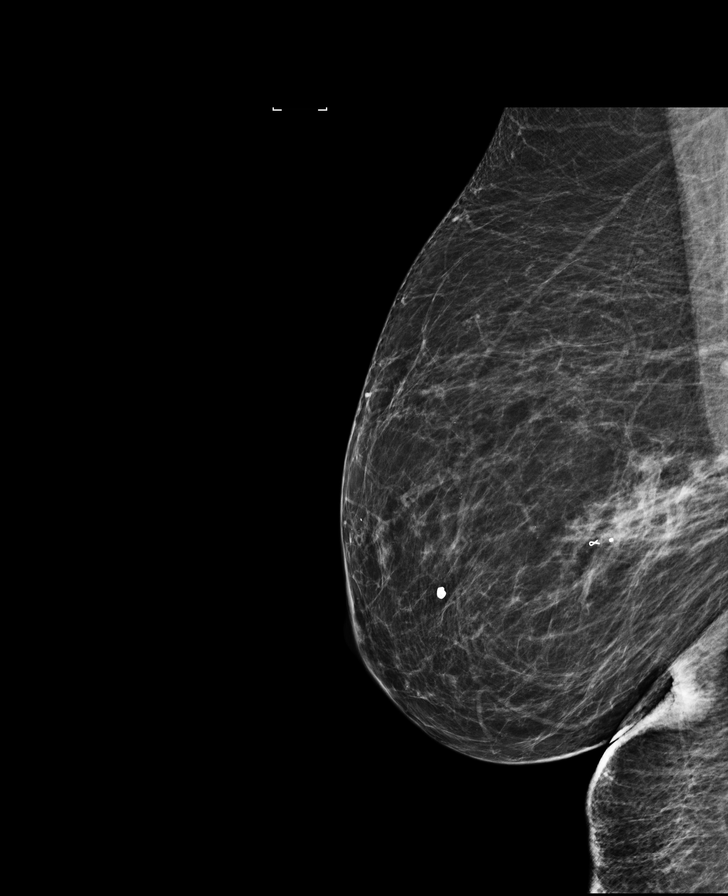

[R CC (1 of 4)]
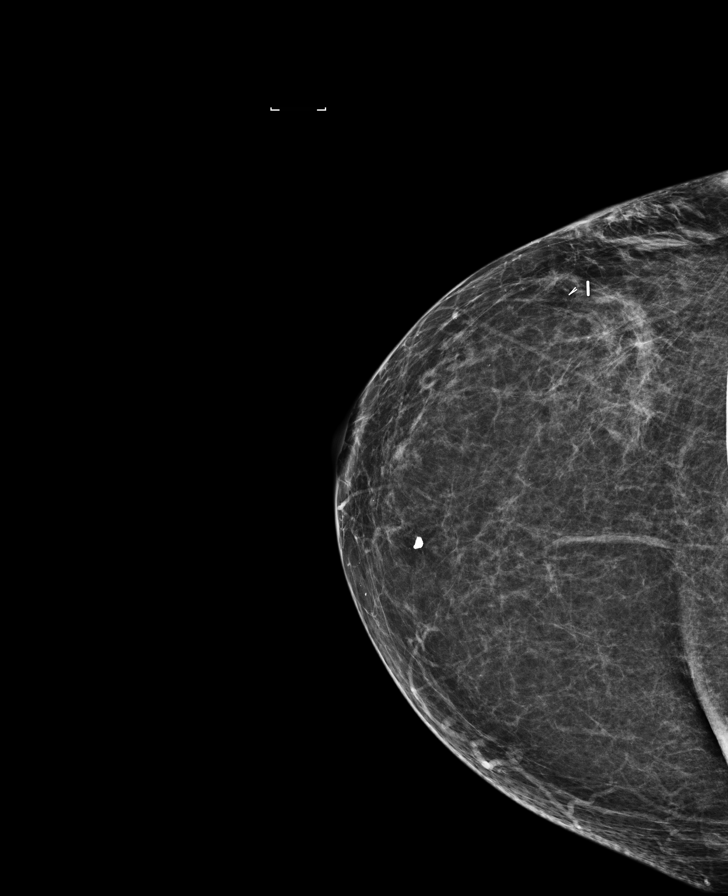

[R LM (2 of 4)]
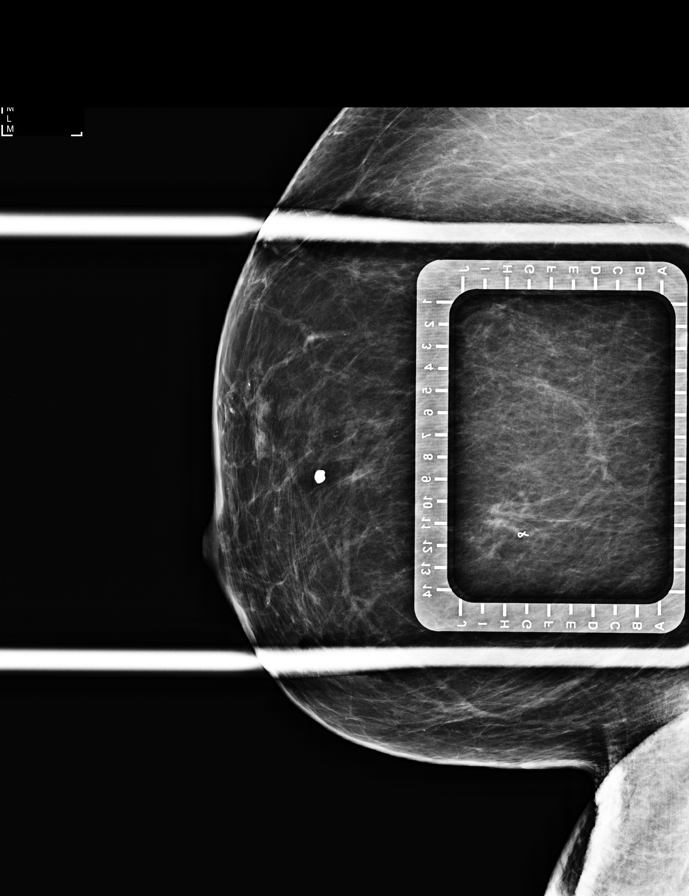

[R CC (2 of 4)]
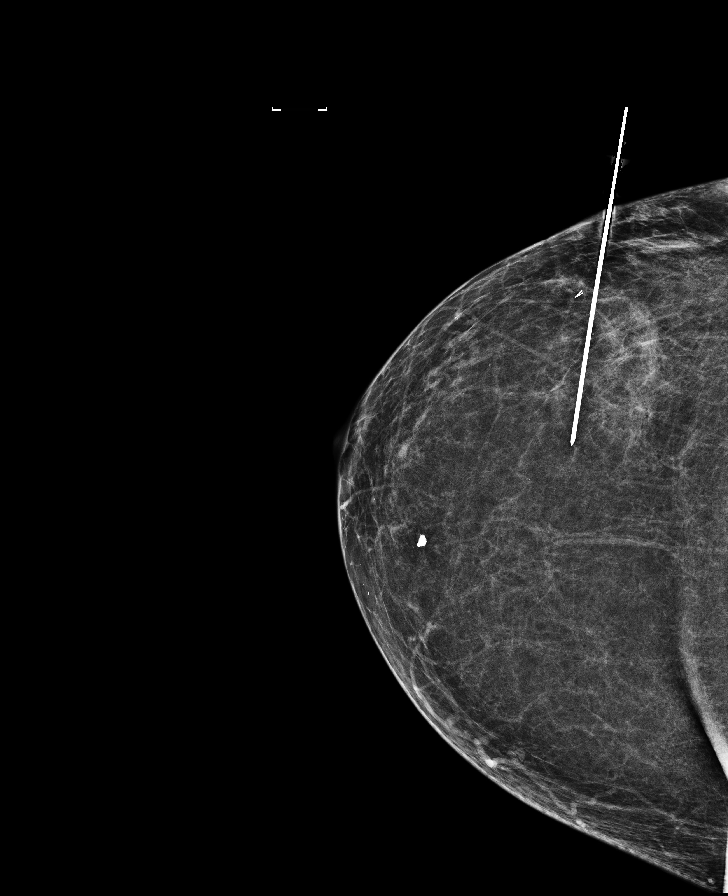

[R CC (3 of 4)]
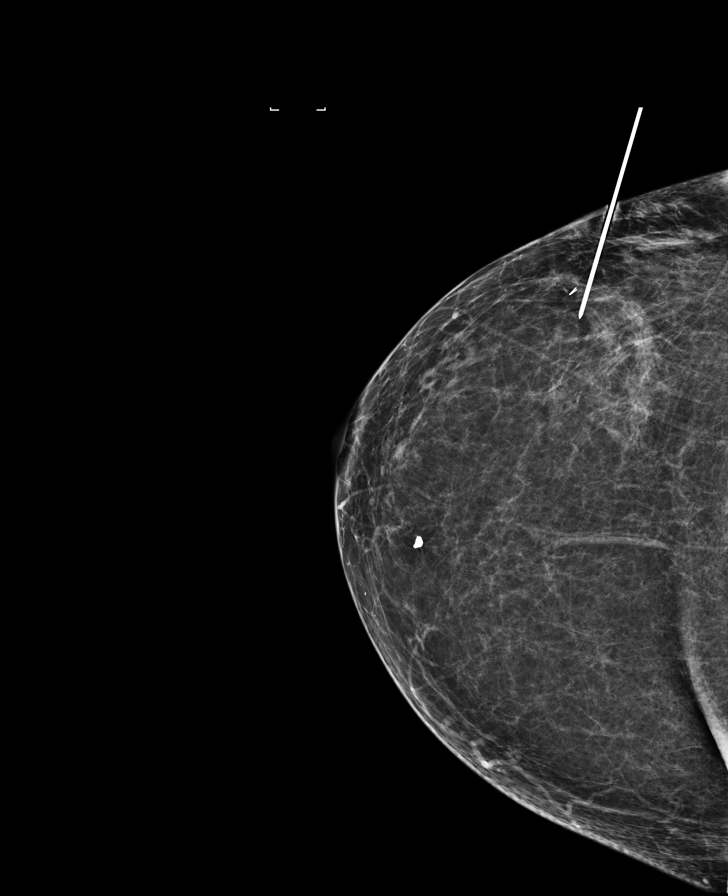

[R LM (3 of 4)]
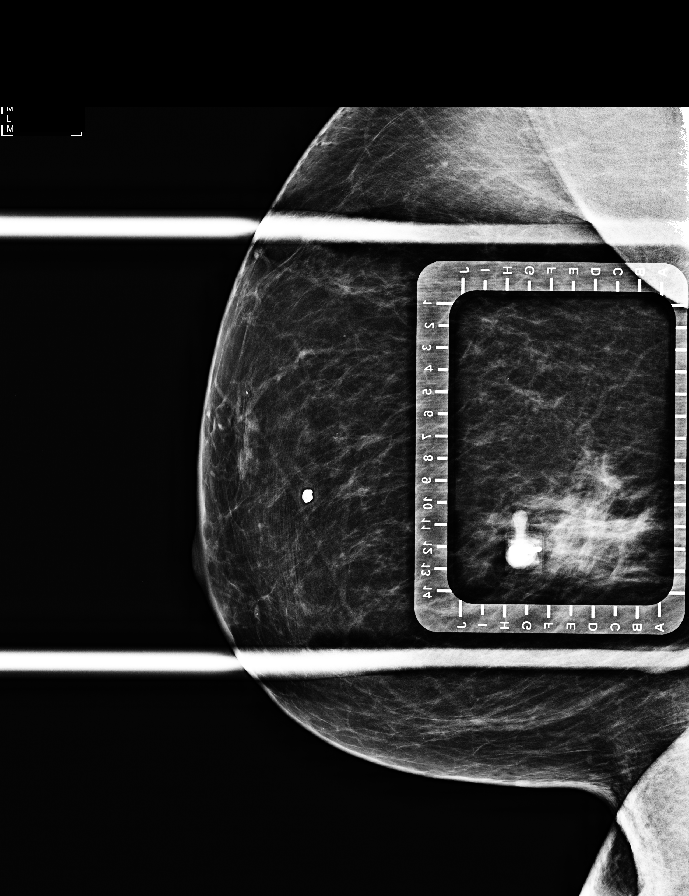

[R LM (4 of 4)]
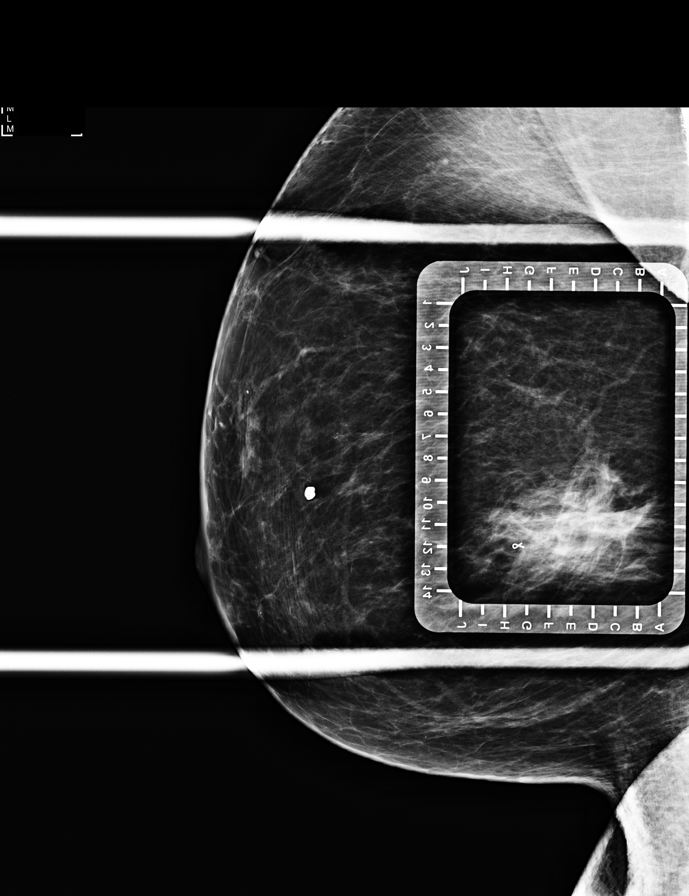

[R CC (4 of 4)]
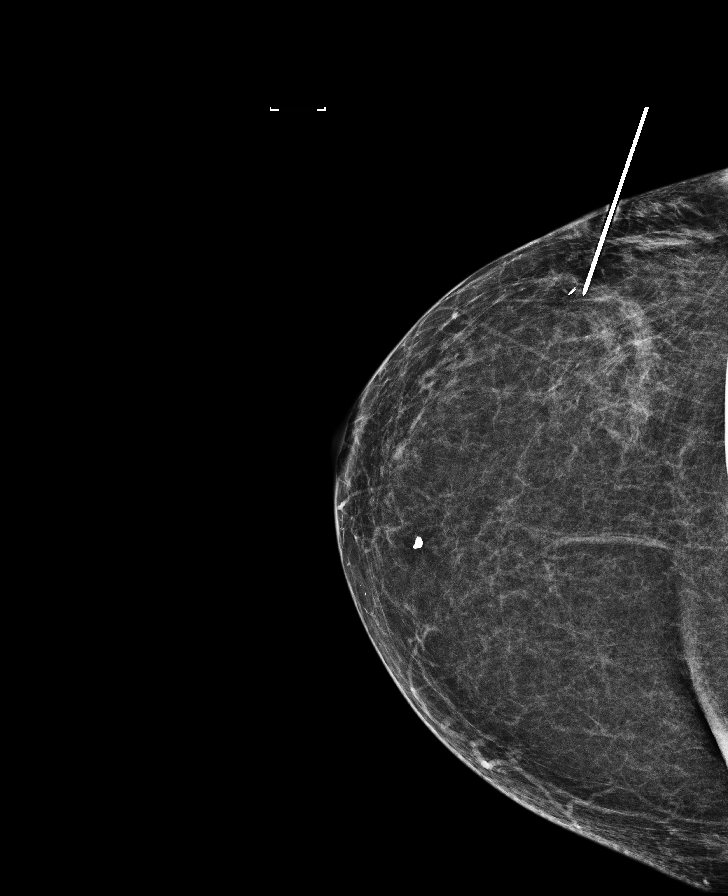

[8 of 9 positions shown; findings below may reference images not displayed]

FINDINGS: Patient presents for radioactive seed localization prior to
lumpectomy. I met with the patient and we discussed the procedure of
seed localization including benefits and alternatives. We discussed
the high likelihood of a successful procedure. We discussed the
risks of the procedure including infection, bleeding, tissue injury
and further surgery. We discussed the low dose of radioactivity
involved in the procedure. Informed, written consent was given.

The usual time-out protocol was performed immediately prior to the
procedure.

Using mammographic guidance, sterile technique, 1% lidocaine and an
[id number] radioactive seed, the ribbon shaped clip in the LATERAL
portion of the RIGHT breast was localized using a it has LATERAL to
MEDIAL approach. The follow-up mammogram images confirm the seed in
the expected location and were marked for Dr. Ngemu.

Follow-up survey of the patient confirms presence of the radioactive
seed.

Order number of [id number] seed:  [id number].

Total activity:  0.255 millicuries reference Date: 04/13/2019

The patient tolerated the procedure well and was released from the
[REDACTED]. She was given instructions regarding seed removal.
IMPRESSION: Radioactive seed localization right breast. No apparent
complications.

## 2021-04-03 NOTE — Progress Notes (Addendum)
Patient Care Team: [name], [place] as PCP - General (Internal Medicine) [name], [name], MD as PCP - Cardiology (Cardiology) Larey Dresser, MD as PCP - Advanced Heart Failure (Cardiology) [name], RN as Oncology Nurse Navigator [name], RN as Oncology Nurse Navigator  DIAGNOSIS:    ICD-10-CM   1. Malignant neoplasm of upper-outer quadrant of right breast in female, estrogen receptor positive ([place])  C50.411    Z17.0       SUMMARY OF ONCOLOGIC HISTORY: Oncology History  Malignant neoplasm of upper-outer quadrant of right breast in female, estrogen receptor positive ([place])  03/16/2019 Cancer Staging   Staging form: Breast, AJCC 8th Edition - Clinical stage from 03/16/2019: Stage IA (cT1c, cN0, cM0, G2, ER+, PR+, HER2+) - Signed by [name], NP on 03/25/2019   03/25/2019 Initial Diagnosis   Routine screening mammogram detected a 1.3cm mass in the right breast, 9 o'clock position, no axillary adenopathy. 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    04/15/2019 Surgery   Right lumpectomy [name]): IDC, grade 2, 1.3cm, with intermediate grade DCIS, clear margins, and 1 right axillary lymph node negative   04/15/2019 Cancer Staging   Staging form: Breast, AJCC 8th Edition - Pathologic stage from 04/15/2019: Stage IA (pT1c, pN0, cM0, G2, ER+, PR+, HER2+) - Signed by [name], NP on 04/29/2019   05/13/2019 -  Chemotherapy   Weekly Taxol and Herceptin x 12, then maintenance Herceptin every 3 weeks x 1 year   08/20/2019 -  Radiation Therapy   Adjuvant radiation   09/2019 -  Anti-estrogen oral therapy   Anastrozole     CHIEF COMPLIANT: Follow-up of breast cancer on anastrozole   INTERVAL HISTORY: Margaret Norman is a 70 y.o. with above-mentioned history of breast cancer who underwent a lumpectomy, radiation, and is currently on antiestrogen therapy with anastrozole. Mammogram  on 06/14/2020 showed no evidence of malignancy. She presents to the clinic today for follow-up.  She complains of joint stiffness and arthritis in her hips and knees.  Unclear if these are related to anastrozole therapy.  Does not have any hot flashes.  [facility]  ALLERGIES:  is allergic to invokana [canagliflozin] and amoxicillin.  MEDICATIONS:  Current Outpatient Medications  Medication Sig Dispense Refill   acetaminophen (TYLENOL) 500 MG tablet Take 1,000 mg by mouth every 6 (six) hours as needed for moderate pain.     anastrozole (ARIMIDEX) 1 MG tablet Take 1 tablet (1 mg total) by mouth daily. 90 tablet 3   aspirin 81 MG EC tablet Take 81 mg by mouth daily.     Blood Glucose Monitoring Suppl (ONETOUCH VERIO FLEX SYSTEM) W/DEVICE KIT 1 kit by Does not apply route 3 (three) times daily. 1 kit 1   buPROPion (WELLBUTRIN XL) 150 MG 24 hr tablet TAKE 1 TABLET BY MOUTH DAILY IN THE MORNING 90 tablet 1   carvedilol (COREG) 12.5 MG tablet Take 1 tablet (12.5 mg total) by mouth 2 (two) times daily. 180 tablet 3   Cholecalciferol (VITAMIN D) 50 MCG (2000 UT) tablet Take 2,000 Units by mouth daily.     ENTRESTO 49-51 MG TAKE 1 TABLET BY MOUTH TWICE DAILY 180 tablet 3   fluticasone (FLONASE) 50 MCG/ACT nasal spray Place 2 sprays into both nostrils daily. 9.9 g 0   glucose blood (ONETOUCH VERIO) test strip Test three times daily as  directed. DX: E11.9 300 each 3   Insulin Pen Needle 32G X 4 MM MISC 1 each by Other route daily. E11.9; THIS IS THE CORRECT PEN NEEDLE. PLEASE FILL RX AS ORDERED 100 each 3   JARDIANCE 25 MG TABS tablet TAKE 1 TABLET BY MOUTH DAILY 90 tablet 3   LANTUS SOLOSTAR 100 UNIT/ML Solostar Pen INJECT SUBCUTANEOUSLY 30  UNITS AT BEDTIME 30 mL 3   metFORMIN (GLUCOPHAGE) 1000 MG tablet TAKE 1 TABLET BY MOUTH TWICE DAILY WITH A MEAL 180 tablet 3   omeprazole (PRILOSEC) 40 MG capsule Take 1 capsule (40 mg total) by mouth 2 (two) times daily. Take  one tablet 30-60 minutes before breakfast and dinner. 60 capsule 3   OneTouch Delica Lancets 70Y MISC 1 each by Does not apply route daily. E11.9 100 each 0   Probiotic Product (PROBIOTIC ADVANCED PO) Take 1 tablet by mouth at bedtime.      rosuvastatin (CRESTOR) 10 MG tablet TAKE 1 TABLET BY MOUTH DAILY 90 tablet 1   sertraline (ZOLOFT) 100 MG tablet TAKE 1 TABLET BY MOUTH DAILY 90 tablet 2   spironolactone (ALDACTONE) 25 MG tablet Take 1 tablet (25 mg total) by mouth at bedtime. 90 tablet 3   TRULICITY 3 FV/4.9SW SOPN INJECT 3 MG INTO THE SKIN  WEEKLY 6 mL 1   vitamin B-12 (CYANOCOBALAMIN) 100 MCG tablet Take 100 mcg by mouth daily.     Vitamin E 180 MG CAPS Take 180 mg by mouth at bedtime.      No current facility-administered medications for this visit.    PHYSICAL EXAMINATION: ECOG PERFORMANCE STATUS: 1 - Symptomatic but completely ambulatory  Vitals:   04/04/21 0959  BP: (!) 149/61  Pulse: 82  Resp: 18  Temp: 97.7 F (36.5 C)  SpO2: 99%   Filed Weights   04/04/21 0959  Weight: 136 lb 8 oz (61.9 kg)    BREAST: No palpable masses or nodules in either right or left breasts. No palpable axillary supraclavicular or infraclavicular adenopathy no breast tenderness or nipple discharge. (exam performed in the presence of a chaperone)  LABORATORY DATA:  I have reviewed the data as listed CMP Latest Ref Rng & Units 10/10/2020 06/28/2020 04/20/2020  Glucose 70 - 99 mg/dL 118(H) 131(H) 221(H)  BUN 8 - 23 mg/dL 19 17 24(H)  Creatinine 0.44 - 1.00 mg/dL 0.80 0.77 0.92  Sodium 135 - 145 mmol/L 142 143 138  Potassium 3.5 - 5.1 mmol/L 4.7 4.3 4.2  Chloride 98 - 111 mmol/L 105 107 103  CO2 22 - 32 mmol/L 27 24 24   Calcium 8.9 - 10.3 mg/dL 10.1 9.5 10.1  Total Protein 6.1 - 8.1 g/dL - - -  Total Bilirubin 0.2 - 1.2 mg/dL - - -  Alkaline Phos 38 - 126 U/L - - -  AST 10 - 35 U/L - - -  ALT 6 - 29 U/L - - -    Lab Results  Component Value Date   WBC 14.6 (H) 02/17/2020   HGB 12.7  02/17/2020   HCT 39.0 02/17/2020   MCV 90.7 02/17/2020   PLT 485 (H) 02/17/2020   NEUTROABS 8,979 (H) 02/17/2020    ASSESSMENT & PLAN:  Malignant neoplasm of upper-outer quadrant of right breast in female, estrogen receptor positive ([place]) Routine screening mammogram detected a 1.3cm mass in the right breast, 9 o'clock position, no axillary adenopathy. 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  T1c  N0 stage Ia   Recommendations: 1. 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 Adj chemo with Taxol-Herceptin foll by Herceptin maintenance for 1 year started 05/13/2019, discontinued February 2021 for low ejection fraction 3. Adjuvant radiation therapy 08/20/2019-09/14/2019 4. Adjuvant antiestrogen therapy with anastrozole 1 mg daily started April 2021   Enrolled in SWOG 1714 trials.  ------------------------------------------------------------------------------------------------------------------------- Current treatment: Anastrozole 1 mg daily started April 2021 Echo EF 55 to 60% 04/15/2019 EF 30 to 35% on 07/20/2019 EF 45% 10/10/2020  Anastrozole toxicities: Tolerating it very well without any hot flashes or arthralgias.  Breast cancer surveillance: 1.  Breast exam 04/04/2021: Benign 2. mammogram 06/14/2020: Benign breast density category B Brain MRI 09/09/2020: Benign   Bone density 05/31/2020: T score -1.1: (Mild osteopenia): Continue with calcium and vitamin D  Return to clinic in 1 year for follow-up    No orders of the defined types were placed in this encounter.  The patient has a good understanding of the overall plan. she agrees with it. she will call with any problems that may develop before the next visit here.  Total time spent: 20 mins including face to face time and time spent for planning,  charting and coordination of care  Rulon Eisenmenger, MD, MPH 04/04/2021  I, Thana Ates, am acting as scribe for Dr. Nicholas Lose.  I have reviewed the above documentation for accuracy and completeness, and I agree with the above.

## 2021-04-04 ENCOUNTER — Inpatient Hospital Stay: Payer: HMO | Attending: Hematology and Oncology | Admitting: Hematology and Oncology

## 2021-04-04 ENCOUNTER — Other Ambulatory Visit: Payer: Self-pay

## 2021-04-04 DIAGNOSIS — Z170 Estrogen receptor positive status [ER+]: Secondary | ICD-10-CM | POA: Insufficient documentation

## 2021-04-04 DIAGNOSIS — Z79811 Long term (current) use of aromatase inhibitors: Secondary | ICD-10-CM | POA: Insufficient documentation

## 2021-04-04 DIAGNOSIS — C50411 Malignant neoplasm of upper-outer quadrant of right female breast: Secondary | ICD-10-CM | POA: Diagnosis not present

## 2021-04-04 DIAGNOSIS — M8580 Other specified disorders of bone density and structure, unspecified site: Secondary | ICD-10-CM | POA: Insufficient documentation

## 2021-04-04 MED ORDER — ANASTROZOLE 1 MG PO TABS: 1.0000 mg | ORAL_TABLET | Freq: Every day | ORAL | 3 refills | Status: DC

## 2021-04-04 NOTE — Assessment & Plan Note (Signed)
Routine screening mammogram detected a 1.3cm mass in the right breast, 9 o'clock position, no axillary adenopathy. 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 T1c N0 stage Ia  Recommendations: 1. 04/15/19: Breast conserving surgery: Right lumpectomy [name]): IDC, grade 2, 1.3cm, with intermediate grade DCIS, clear margins, and 1 right axillary lymph node negative 2.Adj chemo with Taxol-Herceptin foll by Herceptin maintenance for 1 yearstarted 05/13/2019, discontinued February 2021 for low ejection fraction 3. Adjuvant radiation therapy2/25/2021-09/14/2019 4. Adjuvant antiestrogen therapywith anastrozole 1 mg daily started April 2021  Enrolled inSWOG 1714 trials.  ------------------------------------------------------------------------------------------------------------------------- Current treatment: Anastrozole 1 mg daily started April 2021 EchoEF 55 to 60%04/15/2019 EF 30 to 35% on 07/20/2019 EF 45% 10/10/2020  Anastrozole toxicities: Tolerating it very well without any hot flashes or arthralgias.  Breast cancer surveillance: 1.  Breast exam 04/04/2021: Benign 2. mammogram 06/14/2020: Benign breast density category B Brain MRI 09/09/2020: Benign  Return to clinic in 1 year for follow-up

## 2021-04-12 ENCOUNTER — Encounter: Payer: Self-pay | Admitting: Family

## 2021-04-28 ENCOUNTER — Encounter: Payer: Self-pay | Admitting: Family

## 2021-04-28 ENCOUNTER — Other Ambulatory Visit: Payer: Self-pay

## 2021-04-28 ENCOUNTER — Ambulatory Visit (INDEPENDENT_AMBULATORY_CARE_PROVIDER_SITE_OTHER): Payer: HMO | Admitting: Family

## 2021-04-28 VITALS — BP 110/60 | HR 91 | Temp 97.9°F | Ht 62.0 in | Wt 134.2 lb

## 2021-04-28 DIAGNOSIS — Z0000 Encounter for general adult medical examination without abnormal findings: Secondary | ICD-10-CM | POA: Diagnosis not present

## 2021-04-28 DIAGNOSIS — Z23 Encounter for immunization: Secondary | ICD-10-CM | POA: Diagnosis not present

## 2021-04-28 MED ORDER — SERTRALINE HCL 100 MG PO TABS: 100.0000 mg | ORAL_TABLET | Freq: Every day | ORAL | 3 refills | Status: DC

## 2021-04-28 MED ORDER — ROSUVASTATIN CALCIUM 10 MG PO TABS: 10.0000 mg | ORAL_TABLET | Freq: Every day | ORAL | 1 refills | Status: DC

## 2021-04-28 NOTE — Patient Instructions (Signed)
Please take your Omeprazole 40 mg twice a day to see if this helps with your abdominal discomfort. If the symptoms persist, please talk to Dr. Cruzita Lederer.

## 2021-04-28 NOTE — Progress Notes (Signed)
Margaret Norman is a 70 y.o. female with the following history as recorded in EpicCare:  Patient Active Problem List   Diagnosis Date Noted   Multiple thyroid nodules 09/20/2020   Port-A-Cath in place 06/10/2019   Breast cancer ([place]) 04/15/2019   Second degree AV block    Malignant neoplasm of upper-outer quadrant of right breast in female, estrogen receptor positive ([place]) 03/25/2019   Thyromegaly 01/10/2018   Overweight (BMI 25.0-29.9) 08/13/2017   Upper respiratory tract infection 07/30/2017   Type 2 diabetes mellitus with hyperglycemia, with long-term current use of insulin ([place]) 03/19/2017   Thiamine deficiency 01/04/2017   Tinnitus aurium, 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   Deficiency anemia 04/30/2016   Routine general medical examination at a health care facility 04/27/2015   Abdominal wall hernia 04/26/2015   Family history of hemochromatosis 09/13/2014   Acute maxillary sinusitis 05/14/2014   Personal history of colonic polyps 03/10/2014   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   Lumbosacral spondylosis without myelopathy 05/12/2013   Insomnia, persistent 02/04/2013   Obesity (BMI 30-39.9) 02/04/2013   Unspecified asthma, with exacerbation 09/22/2012   Visit for screening mammogram 07/04/2012   Nephrolithiasis    HTN (hypertension)    Gallstones    LBBB (left bundle branch block)    Leukocytosis 12/20/2008   Hyperlipidemia with target LDL less than 100 09/04/2007   Chronic depression 09/04/2007   GERD 09/04/2007    Current Outpatient Medications  Medication Sig Dispense Refill   acetaminophen (TYLENOL) 500 MG tablet Take 1,000 mg by mouth every 6 (six) hours as needed for moderate pain.     anastrozole (ARIMIDEX) 1 MG tablet Take 1 tablet (1 mg total) by mouth daily. 90 tablet 3   aspirin 81 MG EC tablet Take 81 mg by mouth daily.     Blood Glucose Monitoring Suppl (ONETOUCH VERIO FLEX SYSTEM) W/DEVICE KIT 1 kit by Does not apply route 3 (three) times daily. 1 kit 1    buPROPion (WELLBUTRIN XL) 150 MG 24 hr tablet TAKE 1 TABLET BY MOUTH DAILY IN THE MORNING 90 tablet 1   carvedilol (COREG) 12.5 MG tablet Take 1 tablet (12.5 mg total) by mouth 2 (two) times daily. 180 tablet 3   Cholecalciferol (VITAMIN D) 50 MCG (2000 UT) tablet Take 2,000 Units by mouth daily.     ENTRESTO 49-51 MG TAKE 1 TABLET BY MOUTH TWICE DAILY 180 tablet 3   fluticasone (FLONASE) 50 MCG/ACT nasal spray Place 2 sprays into both nostrils daily. 9.9 g 0   glucose blood (ONETOUCH VERIO) test strip Test three times daily as directed. DX: E11.9 300 each 3   Insulin Pen Needle 32G X 4 MM MISC 1 each by Other route daily. E11.9; THIS IS THE CORRECT PEN NEEDLE. PLEASE FILL RX AS ORDERED 100 each 3   JARDIANCE 25 MG TABS tablet TAKE 1 TABLET BY MOUTH DAILY 90 tablet 3   LANTUS SOLOSTAR 100 UNIT/ML Solostar Pen INJECT SUBCUTANEOUSLY 30  UNITS AT BEDTIME 30 mL 3   metFORMIN (GLUCOPHAGE) 1000 MG tablet TAKE 1 TABLET BY MOUTH TWICE DAILY WITH A MEAL 180 tablet 3   omeprazole (PRILOSEC) 40 MG capsule Take 1 capsule (40 mg total) by mouth 2 (two) times daily. Take one tablet 30-60 minutes before breakfast and dinner. 60 capsule 3   OneTouch Delica Lancets 06Y MISC 1 each by Does not apply route daily. E11.9 100 each 0   Probiotic Product (PROBIOTIC ADVANCED PO) Take 1 tablet by mouth at bedtime.  spironolactone (ALDACTONE) 25 MG tablet Take 1 tablet (25 mg total) by mouth at bedtime. 90 tablet 3   TRULICITY 3 IF/0.2DX SOPN INJECT 3 MG INTO THE SKIN  WEEKLY 6 mL 1   vitamin B-12 (CYANOCOBALAMIN) 100 MCG tablet Take 100 mcg by mouth daily.     Vitamin E 180 MG CAPS Take 180 mg by mouth at bedtime.      rosuvastatin (CRESTOR) 10 MG tablet Take 1 tablet (10 mg total) by mouth daily. 90 tablet 1   sertraline (ZOLOFT) 100 MG tablet Take 1 tablet (100 mg total) by mouth daily. 90 tablet 3   No current facility-administered medications for this visit.    Allergies: Invokana [canagliflozin] and Amoxicillin   Past Medical History:  Diagnosis Date   Arthritis    Chest pain    Nuclear, adenosine,  December, 2013, low risk nuclear scan with small, moderate in intensity, fixed anteroseptal defect. This is possibly related to an LBBB versus small prior infarct. 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 w/appy   CHOLECYSTECTOMY     ESOPHAGOGASTRODUODENOSCOPY     HERNIA REPAIR     KNEE ARTHROSCOPY Right 11/2005   LIVER BIOPSY     PANCREATIC CYST EXCISION     PORT-A-CATH REMOVAL Left 04/25/2020   Procedure: REMOVAL PORT-A-CATH;  Surgeon: Coralie Keens, MD;  Location: [facility];  Service: General;  Laterality: Left;   PORTACATH PLACEMENT Left 04/15/2019   Procedure: INSERTION PORT-A-CATH WITH ULTRASOUND;  Surgeon: Coralie Keens, MD;  Location: [facility];  Service: General;  Laterality: Left;   RIGHT/LEFT HEART CATH AND CORONARY ANGIOGRAPHY N/A 09/28/2019   Procedure: RIGHT/LEFT HEART CATH AND CORONARY ANGIOGRAPHY;  Surgeon: Larey Dresser, MD;  Location: [facility];  Service: Cardiovascular;  Laterality: N/A;    SPLENECTOMY     TONSILLECTOMY AND ADENOIDECTOMY      Family History  Problem Relation Age of Onset   Liver disease Mother    Dementia Mother    Diabetes Mother        borderline   Coronary artery disease Father    Heart attack Father    Hypertension Father    Heart disease Father    Cancer Other        leukemia   Stroke Maternal Grandfather    Hyperlipidemia Brother        Amyloidosis   Diabetes Maternal Grandmother    Cancer Paternal Uncle        unknown   Heart attack Paternal Grandmother  Heart attack Paternal Uncle    Hypertension Brother    Diabetes Daughter        borderline   Colon cancer Neg Hx    Esophageal cancer Neg Hx    Rectal cancer Neg Hx    Stomach cancer Neg Hx     Social History   Tobacco Use   Smoking status: Never   Smokeless tobacco: Never  Substance Use Topics   Alcohol use: No    Subjective:  Presents for yearly CPE; majority of care is managed by her specialists including oncology, endocrinology and cardiology; Would like to get flu shot today; up to date on dental and vision exams;  Review of Systems  Constitutional: Negative.   HENT: Negative.    Eyes: Negative.   Respiratory: Negative.    Cardiovascular: Negative.   Gastrointestinal: Negative.   Genitourinary: Negative.   Musculoskeletal: Negative.   Skin: Negative.   Neurological: Negative.   Endo/Heme/Allergies: Negative.   Psychiatric/Behavioral: Negative.        Objective:  Vitals:   04/28/21 1010  BP: 110/60  Pulse: 91  Temp: 97.9 F (36.6 C)  TempSrc: Oral  SpO2: 96%  Weight: 134 lb 3.2 oz (60.9 kg)  Height: _0  (1.575 m)    General: Well developed, well nourished, in no acute distress  Skin : Warm and dry.  Head: Normocephalic and atraumatic  Eyes: Sclera and conjunctiva clear; pupils round and reactive to light; extraocular movements intact  Ears: External normal; canals clear; tympanic membranes normal  Oropharynx: Pink, supple. No suspicious lesions   Neck: Supple without thyromegaly, adenopathy  Lungs: Respirations unlabored; clear to auscultation bilaterally without wheeze, rales, rhonchi  CVS exam: normal rate and regular rhythm.  Abdomen: Soft; nontender; nondistended; normoactive bowel sounds; no masses or hepatosplenomegaly  Musculoskeletal: No deformities; no active joint inflammation  Extremities: No edema, cyanosis, clubbing  Vessels: Symmetric bilaterally  Neurologic: Alert and oriented; speech intact; face symmetrical; moves all extremities well; CNII-XII intact without focal deficit   Assessment:  1. PE (physical exam), annual   2. Need for immunization against influenza     Plan:  Age appropriate preventive healthcare needs addressed; encouraged regular eye doctor and dental exams; encouraged regular exercise; will update labs refills as needed today; follow-up to be determined; Discussed Shingrix and she will plan to get at her pharmacy at a later date;  Encouraged to try taking her Omeprazole 40 mg bid- if GI symptoms persist, ask about d/c Trulicity when she sees endocrine at upcoming appointment;   This visit occurred during the SARS-CoV-2 public health emergency.  Safety protocols were in place, including screening questions prior to the visit, additional usage of staff PPE, and extensive cleaning of exam room while observing appropriate contact time as indicated for disinfecting solutions.    No follow-ups on file.  Orders Placed This Encounter  Procedures   Flu Vaccine QUAD High Dose(Fluad)    Requested Prescriptions   Signed Prescriptions Disp Refills   sertraline (ZOLOFT) 100 MG tablet 90 tablet 3    Sig: Take 1 tablet (100 mg total) by mouth daily.   rosuvastatin (CRESTOR) 10 MG tablet 90 tablet 1    Sig: Take 1 tablet (10 mg total) by mouth daily.

## 2021-05-04 ENCOUNTER — Other Ambulatory Visit: Payer: Self-pay | Admitting: Hematology and Oncology

## 2021-05-04 ENCOUNTER — Encounter: Payer: Self-pay | Admitting: Internal Medicine

## 2021-05-04 ENCOUNTER — Other Ambulatory Visit: Payer: Self-pay | Admitting: Internal Medicine

## 2021-05-04 DIAGNOSIS — Z853 Personal history of malignant neoplasm of breast: Secondary | ICD-10-CM

## 2021-05-04 MED ORDER — FREESTYLE LIBRE 2 READER DEVI: 1.0000 | Freq: Every day | 0 refills | Status: DC

## 2021-05-04 MED ORDER — FREESTYLE LIBRE 2 SENSOR MISC: 1.0000 | 3 refills | Status: DC

## 2021-05-09 ENCOUNTER — Other Ambulatory Visit (HOSPITAL_COMMUNITY): Payer: Self-pay

## 2021-05-09 ENCOUNTER — Encounter (HOSPITAL_COMMUNITY): Payer: Self-pay

## 2021-05-09 ENCOUNTER — Encounter: Payer: Self-pay | Admitting: Internal Medicine

## 2021-05-09 ENCOUNTER — Telehealth (HOSPITAL_COMMUNITY): Payer: Self-pay | Admitting: Pharmacy Technician

## 2021-05-09 ENCOUNTER — Encounter: Payer: Self-pay | Admitting: Hematology and Oncology

## 2021-05-09 NOTE — Telephone Encounter (Signed)
Advanced Heart Failure Patient Advocate Encounter  I received a message that the patient was requesting an assistance application for Entresto. Called and spoke with the patient. She will call back with her income and then we can assess if she is eligible to apply.  Charlann Boxer, CPhT

## 2021-05-11 ENCOUNTER — Encounter (HOSPITAL_COMMUNITY): Payer: Self-pay | Admitting: Cardiology

## 2021-05-17 ENCOUNTER — Other Ambulatory Visit: Payer: Self-pay

## 2021-05-17 ENCOUNTER — Encounter: Payer: Self-pay | Admitting: Internal Medicine

## 2021-05-17 ENCOUNTER — Ambulatory Visit (INDEPENDENT_AMBULATORY_CARE_PROVIDER_SITE_OTHER): Payer: HMO | Admitting: Internal Medicine

## 2021-05-17 VITALS — BP 120/72 | HR 90 | Ht 62.0 in | Wt 131.2 lb

## 2021-05-17 DIAGNOSIS — Z794 Long term (current) use of insulin: Secondary | ICD-10-CM

## 2021-05-17 DIAGNOSIS — E042 Nontoxic multinodular goiter: Secondary | ICD-10-CM | POA: Diagnosis not present

## 2021-05-17 DIAGNOSIS — E1165 Type 2 diabetes mellitus with hyperglycemia: Secondary | ICD-10-CM | POA: Diagnosis not present

## 2021-05-17 DIAGNOSIS — E785 Hyperlipidemia, unspecified: Secondary | ICD-10-CM

## 2021-05-17 LAB — POCT GLYCOSYLATED HEMOGLOBIN (HGB A1C): Hemoglobin A1C: 7.1 % — AB (ref 4.0–5.6)

## 2021-05-17 NOTE — Patient Instructions (Addendum)
Please continue: - Metformin 2000 mg with dinner - Jardiance 25 mg daily before breakfast - Trulicity 3 mg weekly - Lantus 30 units at night  NO EATING AFTER 7 PM.  The most common suppliers for the continuous glucose monitor [facility] libre 2 CGM) are:  [employer]: [place]: [phone] Ext [phone] [employer]: [place]: [place]: [phone] [place]: [phone]  Please return in 4 months.

## 2021-05-17 NOTE — Progress Notes (Signed)
Patient ID: Margaret Norman, female   DOB: May 30, 1951, 70 y.o.   MRN: [id number]   This visit occurred during the SARS-CoV-2 public health emergency.  Safety protocols were in place, including screening questions prior to the visit, additional usage of staff PPE, and extensive cleaning of exam room while observing appropriate contact time as indicated for disinfecting solutions.   HPI: Margaret Norman is a 70 y.o.-year-old female, returning for follow-up for DM2, dx in 2008, insulin-dependent since 2016, uncontrolled, without long term complications.  Last visit  4 months ago.  Interim history:  She continues to have dizziness and lightheadedness but no increased urination, blurry vision, chest pain. Occasional nausea with dizziness.   Last visit she had intermittent abdominal pain after eating larger meals.  EGD was not revealing. Since last visit, she most dinners earlier, and she eats before 7 PM.  Reviewed HbA1c levels: Lab Results  Component Value Date   HGBA1C 7.0 (A) 01/10/2021   HGBA1C 6.9 (A) 09/20/2020   HGBA1C 7.0 (A) 05/18/2020  03/19/2017: HbA1c calculated from fructosamine is 6.7%! 12/31/2016: HbA1c calculated from fructosamine: 6.9%!  Pt is on: - Metformin 1000 mg 2x a day with meals >> 2000 mg with dinner - Jardiance 25 mg daily before breakfast (PAP) - Trulicity 1.5 >> 3 mg weekly (PAP) - Lantus 30 units at night Stopped Actos when started trulicity back. She was on Trulicity but became expensive >> changed to Actos. She was on Invokana >> recurrent UTIs.  Pt checks her sugars 2x a day: - am:  78, 88-136, 142 (forgot medicine) >> 83-132, 157 >> 78-146, 163 - 2h after b'fast: 161-190 >> 189-210 >> n/c >> 94-132 >> n/c - before lunch: 199 >> n/c >> 135-215 >> n/c>> 80-145 - 2h after lunch: 161 >> 176 >> 96 >> 198 >> n/c >> 177 >> n/c - before dinner: 240 (?) >> 119, 167 >> 108 >> n/c >> 78 - 2h after dinner: 292 >> n/c >> 156 >> n/c >> 112 >> n/c - bedtime: 172,  200 >> 138, 217, 227 >> 129 >> 141 >> 74, 152-199 - nighttime: n/c >> 172 >> 178 >> n/c Lowest sugar was 78 >> 60s x1 >> 83>> 74; she has hypoglycemia awareness in the 70s. Highest sugar was 227 >> <200 >> 177 >> 199.  Glucometer: OneTouch Verio  -No CKD, last BUN/creatinine:  Lab Results  Component Value Date   BUN 19 10/10/2020   BUN 17 06/28/2020   CREATININE 0.80 10/10/2020   CREATININE 0.77 06/28/2020  On Entresto.  -+ HL; last set of lipids: Lab Results  Component Value Date   CHOL 97 06/28/2020   HDL 47 06/28/2020   LDLCALC 37 06/28/2020   LDLDIRECT 137.9 02/04/2013   TRIG 67 06/28/2020   CHOLHDL 2.1 06/28/2020  On Crestor 10.  - last eye exam was in 07/2020: No DR, + cataract OS.  Dr. Marica Otter.  -+ numbness and tingling in her toes.  She has restless leg syndrome.  Pt has FH of DM in MGM.  Multinodular goiter:   -Detected on palpation in 2021  Thyroid ultrasound (01/26/2020): Parenchymal Echotexture: Moderately heterogenous Isthmus: 0.3 cm Right lobe: 6.2 cm x 2.1 cm x 2.2 cm Left lobe: 7.7 cm x 3.2 cm x 2.8 cm _________________________________________________________   Nodule # 1: Location: Right; Superior Maximum size: 0.9 cm; Other 2 dimensions: 0.7 cm x 0.7 cm Composition: spongiform (0) Spongiform nodule does not meet criteria for surveillance or biopsy  _________________________________________________________   Nodule # 2: Location: Right; Mid Maximum size: 1.0 cm; Other 2 dimensions: 0.9 cm x 0.6 cm Composition: spongiform (0) Spongiform nodule does not meet criteria for surveillance or biopsy  _________________________________________________________   Nodule # 3:  Location: Right; Mid Maximum size: 1.2 cm; Other 2 dimensions: 1.1 cm x 0.7 cm Composition: spongiform (0) Spongiform nodule does not meet criteria for surveillance or biopsy _________________________________________________________   Nodule # 4:  Location: Right;  Inferior Maximum size: 1.0 cm; Other 2 dimensions: 0.8 cm x 0.8 cm Composition: spongiform (0) Spongiform nodule does not meet criteria for surveillance or biopsy  ________________________________________________________   Nodule # 5:  Location: Right; Inferior Maximum size: 1.5 cm; Other 2 dimensions: 1.4 cm x 0.8 cm Composition: solid/almost completely solid (2) Echogenicity: isoechoic (1) Nodule meets criteria for surveillance  _________________________________________________________   Nodule # 6: Location: Left; Superior Maximum size: 1.2 cm; Other 2 dimensions: 1.1 cm x 0.9 cm  Composition: cannot determine (2) Echogenicity: isoechoic (1) Echogenic foci: macrocalcifications (1)  Nodule meets criteria for surveillance  _______________________________________________________   Nodule # 7: Location: Left; Inferior Maximum size: 4.7 cm; Other 2 dimensions: 4.7 cm x 2.8 cm Composition: mixed cystic and solid (1) Echogenicity: isoechoic (1) Echogenic foci: macrocalcifications (1) Nodule meets criteria for biopsy  ______________________________________________________   No adenopathy   IMPRESSION: Multinodular thyroid.   Left inferior thyroid nodule (labeled 7, TR , 4.7 cm) meets criteria for biopsy, as designated by the newly established ACR TI-RADS criteria, and referral for biopsy is recommended.   Right inferior thyroid nodule (labeled 5, 1.5 cm, TR 3) and the left superior thyroid nodule (labeled 6, 1.2 cm, TR 4) both meet criteria for surveillance, as designated by the newly established ACR TI-RADS criteria. Surveillance ultrasound study recommended to be performed annually up to 5 years.   FNA left inferior thyroid nodule (02/16/2020): Benign  Thyroid U/S (02/07/2021): Parenchymal Echotexture: Markedly heterogenous Isthmus: 0.6 cm Right lobe: 7.4 cm x 2.0 cm x 2.5 cm Left lobe: 8.4 cm x 3.2 cm x 4.3  cm _________________________________________________________   Estimated total number of nodules >/= 1 cm: 6-10  _________________________________________________________   Nodule labeled 1 superior right thyroid, slightly smaller than previous and spongiform. Nodule does not meet criteria for surveillance or biopsy   Nodule labeled 2, right thyroid, TR 2 with cystic components measures less than 1 cm. Nodule does not meet criteria for surveillance or biopsy   Nodule labeled 3, mid right thyroid. Spongiform characteristics measuring 1.3 cm. Nodule does not meet criteria for surveillance or biopsy.   Nodule labeled 4, mid right thyroid, 9 mm, spongiform characteristics. Nodule does not meet criteria for surveillance or biopsy.   Nodule labeled 5, 0.98 cm, spongiform characteristics and does not meet criteria for surveillance or biopsy.   Nodule 6, right lower thyroid, 0.96 cm. Nodule has spongiform characteristics and does not meet criteria for surveillance or biopsy.   Nodule 7, right lower thyroid, smaller than previous, now 1.0 cm. Previous nodule was labeled 5, 1.5 cm. Nodule has clearly spongiform characteristics on the current ultrasound, and has decreased in size below 1 cm. Nodule no longer meets criteria for surveillance.   Nodule labeled 8 on the left, upper left thyroid, 9 mm. Nodule has internal cystic change on the current, TR 2 characteristics, and has decreased in size below 1 cm. Nodule no longer meets criteria for continued surveillance.   The nodules at the left inferior thyroid labeled 9, 10, 11, appear to  all be part of the previous 4.7 cm nodule which was previously biopsied 02/16/2020, and are doubtful to represent independent nodules given the available images and the comparison.   No adenopathy   Recommendations follow those established by the new ACR TI-RADS criteria (J Am Coll Radiol 2017;14:587-595).   IMPRESSION: Enlarged multinodular  thyroid, as above.   Nodule inferior left thyroid has been previously biopsied, as above. Assuming benign result, no further specific follow-up would be indicated.  Pt denies: - feeling nodules in neck - hoarseness - dysphagia - choking - SOB with lying down  Latest TSH was normal Lab Results  Component Value Date   TSH 1.544 10/10/2020   She sees Cardiology - Dr. Aundra Dubin >> on [place], [place]. She also has a history of breast cancer, s/p chemoradiation therapy. She fractured humerus in 02/2020 after tripping >> this is healing.  She had a DXA scan >> normal.  She is on Arimidex.  ROS: + See HPI Neurological: no tremors/+ numbness/+ tingling/+ dizziness  I reviewed pt's medications, allergies, PMH, social hx, family hx, and changes were documented in the history of present illness. Otherwise, unchanged from my initial visit note.  Past Medical History:  Diagnosis Date   Arthritis    Chest pain    Nuclear, adenosine,  December, 2013, low risk nuclear scan with small, moderate in intensity, fixed anteroseptal defect. This is possibly related to an LBBB versus small prior infarct. 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 w/appy   CHOLECYSTECTOMY     ESOPHAGOGASTRODUODENOSCOPY     HERNIA REPAIR     KNEE ARTHROSCOPY Right 11/2005   LIVER BIOPSY     PANCREATIC CYST EXCISION     PORT-A-CATH REMOVAL Left 04/25/2020   Procedure: REMOVAL PORT-A-CATH;  Surgeon: Coralie Keens, MD;  Location: [facility];  Service: General;  Laterality: Left;   PORTACATH PLACEMENT Left 04/15/2019   Procedure: INSERTION PORT-A-CATH WITH ULTRASOUND;  Surgeon: Coralie Keens, MD;  Location: [facility];  Service: General;  Laterality: Left;   RIGHT/LEFT HEART CATH AND CORONARY ANGIOGRAPHY N/A 09/28/2019   Procedure: RIGHT/LEFT HEART CATH AND CORONARY ANGIOGRAPHY;  Surgeon: Larey Dresser, MD;  Location: [facility];  Service: Cardiovascular;  Laterality: N/A;   SPLENECTOMY     TONSILLECTOMY AND ADENOIDECTOMY     Social History   Socioeconomic History   Marital status: Married    Spouse name: Margaretmary Prisk   Number of children: 2   Years of education: Not on file   Highest education level:  Not on file  Occupational History   Occupation: CSR    Employer: [employer]   Occupation: retired  Tobacco Use   Smoking status: Never   Smokeless tobacco: Never  Vaping Use   Vaping Use: Never used  Substance and Sexual Activity   Alcohol use: No   Drug use: No   Sexual activity: Not Currently  Other Topics Concern   Not on file  Social History Narrative   Regular Exercise -  NO   Social Determinants of Health   Financial Resource Strain: Not on file  Food Insecurity: Not on file  Transportation Needs: Not on file  Physical Activity: Not on file  Stress: Not on file  Social Connections: Not on file  Intimate Partner Violence: Not on file   Current Outpatient Medications on File Prior to Visit  Medication Sig Dispense Refill   acetaminophen (TYLENOL) 500 MG tablet Take 1,000 mg by mouth  every 6 (six) hours as needed for moderate pain.     anastrozole (ARIMIDEX) 1 MG tablet Take 1 tablet (1 mg total) by mouth daily. 90 tablet 3   aspirin 81 MG EC tablet Take 81 mg by mouth daily.     Blood Glucose Monitoring Suppl (ONETOUCH VERIO FLEX SYSTEM) W/DEVICE KIT 1 kit by Does not apply route 3 (three) times daily. 1 kit 1   buPROPion (WELLBUTRIN XL) 150 MG 24 hr tablet TAKE 1 TABLET BY MOUTH DAILY IN THE MORNING 90 tablet 1   carvedilol (COREG) 12.5 MG tablet Take 1 tablet (12.5 mg total) by mouth 2 (two) times daily. 180 tablet 3   Cholecalciferol (VITAMIN D) 50 MCG (2000 UT) tablet Take 2,000 Units by mouth daily.     Continuous Blood Gluc Receiver (FREESTYLE LIBRE 2 READER) DEVI 1 each by Does not apply route daily. 1 each 0   Continuous Blood Gluc Sensor (FREESTYLE LIBRE 2 SENSOR) MISC 1 each by Does not apply route every 14 (fourteen) days. 6 each 3   ENTRESTO 49-51 MG TAKE 1 TABLET BY MOUTH TWICE DAILY 180 tablet 3   fluticasone (FLONASE) 50 MCG/ACT nasal spray Place 2 sprays into both nostrils daily. 9.9 g 0   glucose blood (ONETOUCH VERIO) test strip Test three times daily as directed. DX: E11.9 300 each 3   Insulin Pen Needle 32G X 4 MM MISC 1 each by Other route daily. E11.9; THIS IS THE CORRECT PEN NEEDLE. PLEASE FILL RX AS ORDERED 100 each 3   JARDIANCE 25 MG TABS tablet TAKE 1 TABLET BY MOUTH DAILY 90 tablet 3   LANTUS SOLOSTAR 100 UNIT/ML Solostar Pen INJECT SUBCUTANEOUSLY 30  UNITS AT BEDTIME 30 mL 3   metFORMIN (GLUCOPHAGE) 1000 MG tablet TAKE 1 TABLET BY MOUTH TWICE DAILY WITH A MEAL 180 tablet 3   omeprazole (PRILOSEC) 40 MG capsule Take 1 capsule (40 mg total) by mouth 2 (two) times daily. Take one tablet 30-60 minutes before breakfast and dinner. 60 capsule 3   OneTouch Delica Lancets 88P MISC 1 each by Does not apply route daily. E11.9 100 each 0   Probiotic Product (PROBIOTIC ADVANCED PO) Take 1 tablet by mouth at bedtime.      rosuvastatin (CRESTOR) 10 MG tablet  Take 1 tablet (10 mg total) by mouth daily. 90 tablet 1   sertraline (ZOLOFT) 100 MG tablet Take 1 tablet (100 mg total) by mouth daily. 90 tablet 3   spironolactone (ALDACTONE) 25 MG tablet Take 1 tablet (25 mg total) by mouth  at bedtime. 90 tablet 3   TRULICITY 3 QV/9.5GL SOPN INJECT 3 MG INTO THE SKIN  WEEKLY 6 mL 1   vitamin B-12 (CYANOCOBALAMIN) 100 MCG tablet Take 100 mcg by mouth daily.     Vitamin E 180 MG CAPS Take 180 mg by mouth at bedtime.      No current facility-administered medications on file prior to visit.   Allergies  Allergen Reactions   Invokana [Canagliflozin] Other (See Comments)    UTI's   Amoxicillin Other (See Comments)    Sore tongue Did it involve swelling of the face/tongue/throat, SOB, or low BP? No Did it involve sudden or severe rash/hives, skin peeling, or any reaction on the inside of your mouth or nose? No Did you need to seek medical attention at a hospital or doctor's office? No When did it last happen?      5-10 years ago If all above answers are "NO", may proceed with cephalosporin use.    Family History  Problem Relation Age of Onset   Liver disease Mother    Dementia Mother    Diabetes Mother        borderline   Coronary artery disease Father    Heart attack Father    Hypertension Father    Heart disease Father    Cancer Other        leukemia   Stroke Maternal Grandfather    Hyperlipidemia Brother        Amyloidosis   Diabetes Maternal Grandmother    Cancer Paternal Uncle        unknown   Heart attack Paternal Grandmother    Heart attack Paternal Uncle    Hypertension Brother    Diabetes Daughter        borderline   Colon cancer Neg Hx    Esophageal cancer Neg Hx    Rectal cancer Neg Hx    Stomach cancer Neg Hx     PE: BP 120/72 (BP Location: Right Arm, Patient Position: Sitting, Cuff Size: Normal)   Pulse 90   Ht _0  (1.575 m)   Wt 131 lb 3.2 oz (59.5 kg)   SpO2 98%   BMI 24.00 kg/m   Wt Readings from Last 3  Encounters:  05/17/21 131 lb 3.2 oz (59.5 kg)  04/28/21 134 lb 3.2 oz (60.9 kg)  04/04/21 136 lb 8 oz (61.9 kg)   Constitutional: Normal weight, in NAD Eyes: PERRLA, EOMI, no exophthalmos ENT: moist mucous membranes, + full thyroid on palpation (L>R lobe), no cervical lymphadenopathy Cardiovascular: RRR, No MRG Respiratory: CTA B Musculoskeletal: no deformities, strength intact in all 4 Skin: moist, warm, no rashes Neurological: no tremor with outstretched hands, DTR normal in all 4  ASSESSMENT: 1. DM2, insulin-dependent, uncontrolled, without long term complications, but with hyperglycemia  2. HL  3.  Multiple thyroid nodules  PLAN:  1. Patient with longstanding, uncontrolled, type 2 diabetes, on oral antidiabetic regimen with metformin and SGLT2 inhibitor, and also with daily insulin and weekly GLP-1 receptor agonist, with a slightly higher HbA1c at last visit, of 7.0%, increased from 6.9%.  At that time, she only had mild hyperglycemia but no hypoglycemia.  At that time, she did have occasional abdominal pain after eating larger meals and we discussed that this may be a side effect of Trulicity and I advised her to reduce the size of her meals, if possible.  Otherwise, we did not change her regimen. -At this visit, sugars are mostly at goal with occasional  hyperglycemic exceptions at all times of the day that she is checking.  However, many of the blood sugars do appear to be very well controlled, in the 70s and 90s.  Therefore, for now, I did not suggest a change her regimen, as she continues to work on her diet.  I again suggested a CGM.  I sent a freestyle libre CGM prescription to her pharmacy at last visit, but she was not able to obtain it.  At this visit, I gave him a list of suppliers and advised her to contact them and have her send me a prescription request.  Of note, she previously checked with her insurance and the Dexcom CGM is not covered, but they would cover the freestyle  libre CGM. - I suggested to:  Patient Instructions  Please continue: - Metformin 2000 mg with dinner - Jardiance 25 mg daily before breakfast - Trulicity 3 mg weekly - Lantus 30 units at night  NO EATING AFTER 7 PM.  The most common suppliers for the continuous glucose monitor [facility] libre 2 CGM) are:  [employer] [employer]: [place]: [phone] Ext [phone] [employer]: [place]: [place]: [phone] [place]: [phone]  Please return in 4 months.   - we checked her HbA1c: 7.1% (slightly higher) - advised to check sugars at different times of the day - 1-2x a day, rotating check times - advised for yearly eye exams >> she is UTD - return to clinic in 3-4 months  2. HL -Reviewed latest lipid panel from 06/2020: All fractions at goal: Lab Results  Component Value Date   CHOL 97 06/28/2020   HDL 47 06/28/2020   LDLCALC 37 06/28/2020   LDLDIRECT 137.9 02/04/2013   TRIG 67 06/28/2020   CHOLHDL 2.1 06/28/2020  -She continues on Crestor 10 mg daily without side effects  3.  Thyroid nodules -Her left thyroid lobe was enlarged on exam so we checked a thyroid ultrasound in 01/2020.  She had several nodules, of which the left inferior nodule had an indication for biopsy.  She had a biopsy performed and this was benign.  For the 2 of the other nodules, a follow-up was indicated in 1 year.  At last visit, I ordered another thyroid ultrasound, which she had on 02/07/2021.  The nodules appear to be either stable or decreased in size and no imaging follow-up was indicated -She denies neck compression symptoms -Reviewed latest TSH from 09/2020 and this was normal -We will continue to follow her clinically for now  Philemon Kingdom, MD PhD [facility]

## 2021-05-23 ENCOUNTER — Telehealth: Payer: Self-pay | Admitting: Emergency Medicine

## 2021-05-23 NOTE — Telephone Encounter (Signed)
S1714 - A Prospective Observational Cohort Study to Develop a Predictive Model of Taxane-Induced Peripheral Neuropathy in Cancer Patients  05/23/21 -  10:40am: The patient is not at home at this time.  The person who answered the phone recommended to call again this afternoon.  4:00pm: No answer, unable to leave voicemail.  [place] Coordinator I   4:00 PM

## 2021-05-30 ENCOUNTER — Telehealth: Payer: Self-pay | Admitting: Emergency Medicine

## 2021-05-30 ENCOUNTER — Telehealth: Payer: Self-pay

## 2021-05-30 NOTE — Telephone Encounter (Signed)
S1714 - A Prospective Observational Cohort Study to Develop a Predictive Model of Taxane-Induced Peripheral Neuropathy in Cancer Patients  05/30/21 - 104 Week  11:00am: Called patient to complete week 104 assessments.  Patient reports numbness in bilateral feet that has not changed in the past year.  She states this numbness does not affecting her daily activities.  She denies symptoms in her upper extremities.  She denies any tingling, burning or shooting pain.  Completed required PROs (FACT/GOG-NTX-4, EORTC QLQ-CIPN20, and PRO-CTCAE) for this time point verbally over the phone.  The patient denied questions at this time.  She was advised to expect another phone call in approximately one year to complete the 156 week assessments.  Clabe Seal Clinical Research Coordinator I  05/30/21  11:09 AM

## 2021-05-30 NOTE — Telephone Encounter (Signed)
Patient Assistance application completed by patient and provider. Forms faxed to [facility] at [phone] 05/27/21 Fax received from [facility] advising Medication name dose and/or regimen missing. Application corrected and faxed back 05/30/21

## 2021-06-01 DIAGNOSIS — Z6824 Body mass index (BMI) 24.0-24.9, adult: Secondary | ICD-10-CM | POA: Diagnosis not present

## 2021-06-01 DIAGNOSIS — Z124 Encounter for screening for malignant neoplasm of cervix: Secondary | ICD-10-CM | POA: Diagnosis not present

## 2021-06-01 DIAGNOSIS — Z01419 Encounter for gynecological examination (general) (routine) without abnormal findings: Secondary | ICD-10-CM | POA: Diagnosis not present

## 2021-06-01 DIAGNOSIS — N760 Acute vaginitis: Secondary | ICD-10-CM | POA: Diagnosis not present

## 2021-06-02 DIAGNOSIS — Z124 Encounter for screening for malignant neoplasm of cervix: Secondary | ICD-10-CM | POA: Diagnosis not present

## 2021-06-12 ENCOUNTER — Other Ambulatory Visit: Payer: Self-pay | Admitting: Physician Assistant

## 2021-06-23 ENCOUNTER — Other Ambulatory Visit: Payer: Self-pay

## 2021-06-23 ENCOUNTER — Encounter: Payer: Self-pay | Admitting: Family

## 2021-06-23 MED ORDER — ROSUVASTATIN CALCIUM 10 MG PO TABS: 10.0000 mg | ORAL_TABLET | Freq: Every day | ORAL | 1 refills | Status: DC

## 2021-06-30 ENCOUNTER — Encounter (HOSPITAL_BASED_OUTPATIENT_CLINIC_OR_DEPARTMENT_OTHER): Payer: Self-pay | Admitting: *Deleted

## 2021-06-30 ENCOUNTER — Emergency Department (HOSPITAL_BASED_OUTPATIENT_CLINIC_OR_DEPARTMENT_OTHER): Payer: HMO

## 2021-06-30 ENCOUNTER — Telehealth: Payer: Self-pay | Admitting: Family

## 2021-06-30 ENCOUNTER — Emergency Department (HOSPITAL_BASED_OUTPATIENT_CLINIC_OR_DEPARTMENT_OTHER)
Admission: EM | Admit: 2021-06-30 | Discharge: 2021-06-30 | Disposition: A | Discharge status: Home / Self Care | Payer: HMO | Attending: Emergency Medicine | Admitting: Emergency Medicine

## 2021-06-30 ENCOUNTER — Other Ambulatory Visit: Payer: Self-pay

## 2021-06-30 ENCOUNTER — Emergency Department (HOSPITAL_COMMUNITY): Admission: EM | Admit: 2021-06-30 | Discharge: 2021-06-30 | Discharge status: Against Medical Advice | Payer: HMO

## 2021-06-30 DIAGNOSIS — Z20822 Contact with and (suspected) exposure to covid-19: Secondary | ICD-10-CM | POA: Insufficient documentation

## 2021-06-30 DIAGNOSIS — I110 Hypertensive heart disease with heart failure: Secondary | ICD-10-CM | POA: Diagnosis not present

## 2021-06-30 DIAGNOSIS — E119 Type 2 diabetes mellitus without complications: Secondary | ICD-10-CM | POA: Diagnosis not present

## 2021-06-30 DIAGNOSIS — Z7984 Long term (current) use of oral hypoglycemic drugs: Secondary | ICD-10-CM | POA: Insufficient documentation

## 2021-06-30 DIAGNOSIS — I509 Heart failure, unspecified: Secondary | ICD-10-CM | POA: Diagnosis not present

## 2021-06-30 DIAGNOSIS — R42 Dizziness and giddiness: Secondary | ICD-10-CM | POA: Insufficient documentation

## 2021-06-30 DIAGNOSIS — Z7951 Long term (current) use of inhaled steroids: Secondary | ICD-10-CM | POA: Insufficient documentation

## 2021-06-30 DIAGNOSIS — R5383 Other fatigue: Secondary | ICD-10-CM | POA: Diagnosis not present

## 2021-06-30 DIAGNOSIS — Z7982 Long term (current) use of aspirin: Secondary | ICD-10-CM | POA: Diagnosis not present

## 2021-06-30 DIAGNOSIS — Z794 Long term (current) use of insulin: Secondary | ICD-10-CM | POA: Insufficient documentation

## 2021-06-30 DIAGNOSIS — R519 Headache, unspecified: Secondary | ICD-10-CM | POA: Diagnosis not present

## 2021-06-30 LAB — CBC WITH DIFFERENTIAL/PLATELET
Abs Immature Granulocytes: 0.07 10*3/uL (ref 0.00–0.07)
Basophils Absolute: 0.1 10*3/uL (ref 0.0–0.1)
Basophils Relative: 0 %
Eosinophils Absolute: 0.5 10*3/uL (ref 0.0–0.5)
Eosinophils Relative: 3 %
HCT: 36.9 % (ref 36.0–46.0)
Hemoglobin: 11.9 g/dL — ABNORMAL LOW (ref 12.0–15.0)
Immature Granulocytes: 0 %
Lymphocytes Relative: 28 %
Lymphs Abs: 4.5 10*3/uL — ABNORMAL HIGH (ref 0.7–4.0)
MCH: 29.5 pg (ref 26.0–34.0)
MCHC: 32.2 g/dL (ref 30.0–36.0)
MCV: 91.3 fL (ref 80.0–100.0)
Monocytes Absolute: 2 10*3/uL — ABNORMAL HIGH (ref 0.1–1.0)
Monocytes Relative: 13 %
Neutro Abs: 8.9 10*3/uL — ABNORMAL HIGH (ref 1.7–7.7)
Neutrophils Relative %: 56 %
Platelets: 478 10*3/uL — ABNORMAL HIGH (ref 150–400)
RBC: 4.04 MIL/uL (ref 3.87–5.11)
RDW: 14.3 % (ref 11.5–15.5)
WBC: 16.1 10*3/uL — ABNORMAL HIGH (ref 4.0–10.5)
nRBC: 0 % (ref 0.0–0.2)

## 2021-06-30 LAB — RESP PANEL BY RT-PCR (FLU A&B, COVID) ARPGX2
Influenza A by PCR: NEGATIVE
Influenza B by PCR: NEGATIVE
SARS Coronavirus 2 by RT PCR: NEGATIVE

## 2021-06-30 LAB — URINALYSIS, ROUTINE W REFLEX MICROSCOPIC
Bilirubin Urine: NEGATIVE
Glucose, UA: >=500 mg/dL — AB
Ketones, ur: 15 mg/dL — AB
Leukocytes,Ua: NEGATIVE
Nitrite: NEGATIVE
Protein, ur: NEGATIVE mg/dL
Specific Gravity, Urine: 1.025 (ref 1.005–1.030)
pH: 5.5 (ref 5.0–8.0)

## 2021-06-30 LAB — URINALYSIS, MICROSCOPIC (REFLEX)

## 2021-06-30 LAB — BASIC METABOLIC PANEL
Anion gap: 9 (ref 5–15)
BUN: 16 mg/dL (ref 8–23)
CO2: 22 mmol/L (ref 22–32)
Calcium: 9.3 mg/dL (ref 8.9–10.3)
Chloride: 108 mmol/L (ref 98–111)
Creatinine, Ser: 0.71 mg/dL (ref 0.44–1.00)
GFR, Estimated: >60 mL/min (ref >60)
Glucose, Bld: 99 mg/dL (ref 70–99)
Potassium: 4 mmol/L (ref 3.5–5.1)
Sodium: 139 mmol/L (ref 135–145)

## 2021-06-30 LAB — TROPONIN I (HIGH SENSITIVITY): Troponin I (High Sensitivity): 3 ng/L (ref <18)

## 2021-06-30 MED ORDER — MECLIZINE HCL 25 MG PO TABS
12.5000 mg | ORAL_TABLET | Freq: Once | ORAL | Status: AC
  Administered 2021-06-30: 12.5 mg via ORAL
  Filled 2021-06-30: qty 1

## 2021-06-30 MED ORDER — MECLIZINE HCL 12.5 MG PO TABS: 12.5000 mg | ORAL_TABLET | Freq: Three times a day (TID) | ORAL | 0 refills | Status: DC | PRN

## 2021-06-30 NOTE — ED Triage Notes (Signed)
Dizziness x 3 weeks. Fatigue.

## 2021-06-30 NOTE — Telephone Encounter (Signed)
Pt stated has been having dizzy "spells" for the past couple of weeks. She stated they come and go and today is one of her bad days. Pt has dizziness and loss of balance. She was unsure if she should schedule and appointment or what else needed to be done. Given we have no availability today, she was triaged. Please advise.

## 2021-06-30 NOTE — Discharge Instructions (Addendum)
You are seen in the emergency department today for dizziness.  As we discussed overall your lab work has looked reassuring.  The CT scan of your head showed no acute findings.  I am prescribing you a medicine called meclizine, you received 1 dose of this in the ER.  You can take 1 tablet by mouth 3 times daily as needed for dizziness. It helps to treat the most common cause of peripheral vertigo called BPPV. I've attached information about this condition, and I'd like you to discuss this with your primary doctor.   Continue to monitor how you're doing and return to the ER for new or worsening symptoms such as persistent dizziness, or falls.   It has been a pleasure seeing and caring for you today and I hope you start feeling better soon!

## 2021-06-30 NOTE — ED Provider Notes (Signed)
[facility] Provider Note   CSN: [id number] Arrival date & time: 06/30/21  1324     History  Chief Complaint  Patient presents with   Dizziness    Margaret Norman is a 71 y.o. female who presents emergency department complaining of dizziness for 3 weeks and generalized fatigue.  Patient states that her symptoms are made worse when she is sleeping, and when she first wakes up in the morning.  She has not identified any known triggers.  States she is never had symptoms like this before.  She was close to falling the other night, but overall denies any falls or head trauma.   Dizziness Associated symptoms: headaches   Associated symptoms: no chest pain, no palpitations, no shortness of breath and no weakness      Past Medical History:  Diagnosis Date   Arthritis    Chest pain    Nuclear, adenosine,  December, 2013, low risk nuclear scan with small, moderate in intensity, fixed anteroseptal defect. This is possibly related to an LBBB versus small prior infarct. 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 Authorizing Provider  meclizine (ANTIVERT) 12.5 MG tablet Take 1 tablet (12.5 mg total) by mouth 3 (three) times daily as needed for dizziness. 06/30/21  Yes Veer Elamin T, PA-C  acetaminophen (TYLENOL) 500 MG tablet Take 1,000 mg by mouth every 6 (six) hours as needed for moderate pain.    [provider]  anastrozole (ARIMIDEX) 1 MG tablet Take  1 tablet (1 mg total) by mouth daily. 04/04/21   Nicholas Lose, MD  aspirin 81 MG EC tablet Take 81 mg by mouth daily.    [provider]  Blood Glucose Monitoring Suppl (ONETOUCH VERIO FLEX SYSTEM) W/DEVICE KIT 1 kit by Does not apply route 3 (three) times daily. 01/12/15   Janith Lima, MD  buPROPion (WELLBUTRIN XL) 150 MG 24 hr tablet TAKE 1 TABLET BY MOUTH DAILY IN THE MORNING 02/14/21   [name], FNP  carvedilol (COREG) 12.5 MG tablet Take 1 tablet (12.5 mg total) by mouth 2 (two) times daily. 10/10/20   Larey Dresser, MD  Cholecalciferol (VITAMIN D) 50 MCG (2000 UT) tablet Take 2,000 Units by mouth daily.    [provider]  Continuous Blood Gluc Receiver (FREESTYLE LIBRE 2 READER) DEVI 1 each by Does not apply route daily. Patient not taking: Reported on 05/17/2021 05/04/21   Philemon Kingdom, MD  Continuous Blood Gluc Sensor (FREESTYLE LIBRE 2 SENSOR) MISC 1 each by Does not apply route every 14 (fourteen) days. Patient not taking: Reported on 05/17/2021 05/04/21   Philemon Kingdom, MD  ENTRESTO 49-51 MG TAKE 1 TABLET BY MOUTH TWICE DAILY 10/12/20   Larey Dresser, MD  fluticasone [facility]) 50 MCG/ACT nasal spray Place 2 sprays into both nostrils daily. 05/23/20   Muthersbaugh, Jarrett Soho, PA-C  glucose blood (ONETOUCH VERIO) test strip Test three times daily  as directed. DX: E11.9 08/29/20   Philemon Kingdom, MD  Insulin Pen Needle 32G X 4 MM MISC 1 each by Other route daily. E11.9; THIS IS THE CORRECT PEN NEEDLE. PLEASE FILL RX AS ORDERED 12/22/20   Philemon Kingdom, MD  JARDIANCE 25 MG TABS tablet TAKE 1 TABLET BY MOUTH DAILY 10/12/20   Philemon Kingdom, MD  LANTUS SOLOSTAR 100 UNIT/ML Solostar Pen INJECT SUBCUTANEOUSLY 30  UNITS AT BEDTIME 01/10/21   Philemon Kingdom, MD  metFORMIN (GLUCOPHAGE) 1000 MG tablet TAKE 1 TABLET BY MOUTH TWICE DAILY WITH A MEAL 01/11/21   Philemon Kingdom, MD  omeprazole (PRILOSEC) 40 MG capsule TAKE 1 CAPSULE BY MOUTH 30-60  MINUTES PRIOR TO BREAKFAST AND DINNER 06/12/21   Levin Erp, PA  OneTouch Delica Lancets 73A MISC 1 each by Does not apply route daily. E11.9 01/19/20   Philemon Kingdom, MD  Probiotic Product (PROBIOTIC ADVANCED PO) Take 1 tablet by mouth at bedtime.     [provider]  rosuvastatin (CRESTOR) 10 MG tablet Take 1 tablet (10 mg total) by mouth daily. 06/23/21   [name], FNP  sertraline (ZOLOFT) 100 MG tablet Take 1 tablet (100 mg total) by mouth daily. 04/28/21   [name], FNP  spironolactone (ALDACTONE) 25 MG tablet Take 1 tablet (25 mg total) by mouth at bedtime. 10/10/20   Larey Dresser, MD  TRULICITY 3 LP/3.7TK SOPN INJECT 3 MG INTO THE SKIN  WEEKLY 05/30/20   Philemon Kingdom, MD  vitamin B-12 (CYANOCOBALAMIN) 100 MCG tablet Take 100 mcg by mouth daily.    [provider]  Vitamin E 180 MG CAPS Take 180 mg by mouth at bedtime.     [provider]      Allergies    Invokana [canagliflozin] and Amoxicillin    Review of Systems   Review of Systems  Constitutional:  Negative for chills and fever.  Respiratory:  Negative for shortness of breath.   Cardiovascular:  Negative for chest pain, palpitations and leg swelling.  Musculoskeletal:  Negative for back pain, neck pain and neck stiffness.  Neurological:  Positive for dizziness and headaches. Negative for tremors, seizures, syncope, facial asymmetry, speech difficulty, weakness, light-headedness and numbness.  All other systems reviewed and are negative.  Physical Exam Updated Vital Signs BP 138/77    Pulse 81    Temp 98 F (36.7 C) (Oral)    Resp 19    Ht _0  (1.575 m)    Wt 59.5 kg    SpO2 98%    BMI 23.99 kg/m  Physical Exam Vitals and nursing note reviewed.  Constitutional:      Appearance: Normal appearance.  HENT:     Head: Normocephalic and atraumatic.  Eyes:     Extraocular Movements: Extraocular movements intact.     Conjunctiva/sclera: Conjunctivae  normal.     Pupils: Pupils are equal, round, and reactive to light.     Comments: Mild bilateral horizontal nystagmus on EOM exam  Cardiovascular:     Rate and Rhythm: Normal rate and regular rhythm.  Pulmonary:     Effort: Pulmonary effort is normal. No respiratory distress.     Breath sounds: Normal breath sounds.  Abdominal:     General: There is no distension.     Palpations: Abdomen is soft.     Tenderness: There is no abdominal tenderness.  Skin:    General: Skin is warm and dry.  Neurological:     General: No focal deficit present.  Mental Status: She is alert.     Comments: Neuro: Speech is clear, able to follow commands. CN III-XII intact grossly intact. PERRLA. EOMI. Sensation intact throughout. Str 5/5 all extremities.     ED Results / Procedures / Treatments   Labs (all labs ordered are listed, but only abnormal results are displayed) Labs Reviewed  URINALYSIS, ROUTINE W REFLEX MICROSCOPIC - Abnormal; Notable for the following components:      Result Value   APPearance HAZY (*)    Glucose, UA >=500 (*)    Hgb urine dipstick TRACE (*)    Ketones, ur 15 (*)    All other components within normal limits  URINALYSIS, MICROSCOPIC (REFLEX) - Abnormal; Notable for the following components:   Bacteria, UA MANY (*)    All other components within normal limits  CBC WITH DIFFERENTIAL/PLATELET - Abnormal; Notable for the following components:   WBC 16.1 (*)    Hemoglobin 11.9 (*)    Platelets 478 (*)    Neutro Abs 8.9 (*)    Lymphs Abs 4.5 (*)    Monocytes Absolute 2.0 (*)    All other components within normal limits  RESP PANEL BY RT-PCR (FLU A&B, COVID) ARPGX2  BASIC METABOLIC PANEL  TROPONIN I (HIGH SENSITIVITY)    EKG EKG Interpretation  Date/Time:  Friday June 30 2021 13:39:56 EST Ventricular Rate:  84 PR Interval:  206 QRS Duration: 122 QT Interval:  408 QTC Calculation: 482 R Axis:   34 Text Interpretation: Normal sinus rhythm Left bundle branch  block Abnormal ECG When compared with ECG of 28-Jun-2020 09:58, PREVIOUS ECG IS PRESENT Confirmed by Malvin Johns [id number]) on 06/30/2021 1:47:51 PM  Radiology CT Head Wo Contrast  Result Date: 06/30/2021 CLINICAL DATA:  Sudden onset of headache.  Dizziness. EXAM: CT HEAD WITHOUT CONTRAST TECHNIQUE: Contiguous axial images were obtained from the base of the skull through the vertex without intravenous contrast. COMPARISON:  09/09/2020 FINDINGS: Brain: No evidence of acute infarction, hemorrhage, hydrocephalus, extra-axial collection or mass lesion/mass effect. There is mild diffuse low-attenuation within the subcortical and periventricular white matter compatible with chronic microvascular disease. Prominence of sulci and ventricles compatible with atrophy. Vascular: No hyperdense vessel or unexpected calcification. Skull: Normal. Negative for fracture or focal lesion. Sinuses/Orbits: No acute finding. Other: None. IMPRESSION: 1. No acute intracranial abnormalities. 2. Chronic small vessel ischemic change and atrophy. Electronically Signed   By: Kerby Moors M.D.   On: 06/30/2021 17:12    Procedures Procedures    Medications Ordered in ED Medications  meclizine (ANTIVERT) tablet 12.5 mg (12.5 mg Oral Given 06/30/21 1722)    ED Course/ Medical Decision Making/ A&P                           Medical Decision Making This patient presents to the ED for concern of dizziness, this involves an extensive number of treatment options, and is a complaint that carries with it a high risk of complications and morbidity. The emergent differential diagnosis includes, but is not limited to,  The differential diagnosis for vertigo includes but is not limited to: Mnire's disease, labyrinthitis, acoustic neuroma, ototoxicity, positioning vertigo, vertebrobasilar insufficiency, cervical vertigo, diplopia, vestibular migraine, AVM.  Co morbidities that complicate the patient evaluation: Type 2 diabetes, CHF, HTN,  HLD, GERD  Physical exam performed. The pertinent findings include: mild horizontal nystagmus bilaterally on exam. Cannot fully perform HINTS exam as patient is not having active vertigo on my evaluation.  Lab Tests: I Ordered, and personally interpreted labs.  The pertinent results include: Mild leukocytosis of 16, electrolytes within normal limits, normal hemoglobin compared to prior, urinalysis negative for infection, negative COVID and flu testing.   Imaging Studies ordered: I ordered imaging studies including CT head. I independently visualized and interpreted imaging which showed acute intracranial abnormalities. I agree with the radiologist interpretation.   Cardiac Monitoring: The patient was maintained on a cardiac monitor.  My attending physician Dr. Tamera Punt viewed and interpreted the cardiac monitored which showed an underlying rhythm of: Left bundle branch block unchanged compared to prior.   Medicines ordered and prescription drug management: I ordered medication including meclizine for dizziness Reevaluation of the patient after these medicines showed that the patient stayed the same I have reviewed the patients home medicines and have made adjustments as needed  Dispostion: After consideration of the diagnostic results and the patients response to treatment, I feel that patient is not requiring admission or inpatient treatment for her symptoms.  Discussed that her symptoms most commonly follow pattern of peripheral vertigo.  Discussed that she may need MRI imaging, but encourage patient to follow-up with her primary doctor to discuss this.  We will prescribe meclizine for her to take at home as a trial.  She had no improvement while in the emergency department, however she was not currently having vertigo when it was given to her.  Discussed reasons to return to the emergency department, and patient is agreeable to the plan.  I discussed this case with my attending physician Dr.  Roslynn Amble who cosigned this note including patient's presenting symptoms, physical exam, and planned diagnostics and interventions. Attending physician stated agreement with plan or made changes to plan which were implemented.    Final Clinical Impression(s) / ED Diagnoses Final diagnoses:  Vertigo    Rx / DC Orders ED Discharge Orders          Ordered    meclizine (ANTIVERT) 12.5 MG tablet  3 times daily PRN        06/30/21 1838           Portions of this report may have been transcribed using voice recognition software. Every effort was made to ensure accuracy; however, inadvertent computerized transcription errors may be present.    Estill Cotta 06/30/21 1928    Lucrezia Starch, MD 07/03/21 [phone]

## 2021-06-30 NOTE — Telephone Encounter (Signed)
Nurse Assessment Nurse: Ysidro Evert, RN, Levada Dy Date/Time Eilene Ghazi Time): 06/30/2021 11:15:58 AM Confirm and document reason for call. If symptomatic, describe symptoms. ---Caller states she has been having dizziness for weeks and today it is worse. She gets nauseated at times Does the patient have any new or worsening symptoms? ---Yes Will a triage be completed? ---Yes Related visit to physician within the last 2 weeks? ---No Does the PT have any chronic conditions? (i.e. diabetes, asthma, this includes High risk factors for pregnancy, etc.) ---Yes List chronic conditions. ---diabetes, bundle branch block Is this a behavioral health or substance abuse call? ---No Guidelines Guideline Title Affirmed Question Affirmed Notes Nurse Date/Time (Eastern Time) Dizziness - Vertigo SEVERE dizziness (vertigo) (e.g., unable to walk without assistance) Ysidro Evert, RN, Levada Dy 06/30/2021 11:17:19 AM Disp. Time Eilene Ghazi Time) Disposition Final User 06/30/2021 11:19:15 AM Go to ED Now (or PCP triage) Yes Ysidro Evert, RN, Levada Dy PLEASE NOTE: All timestamps contained within this report are represented as [place] Standard Time. CONFIDENTIALTY NOTICE: This fax transmission is intended only for the addressee. It contains information that is legally privileged, confidential or otherwise protected from use or disclosure. If you are not the intended recipient, you are strictly prohibited from reviewing, disclosing, copying using or disseminating any of this information or taking any action in reliance on or regarding this information. If you have received this fax in error, please notify us immediately by telephone so that we can arrange for its return to [place]. Phone: [phone], Toll-Free: [phone], Fax: [phone] Page: 2 of 2 Call Id: [id number] [place] Disagree/Comply Comply Caller Understands Yes PreDisposition Did not know what to do Care Advice Given Per Guideline GO TO ED NOW (OR PCP TRIAGE): * IF NO PCP  (PRIMARY CARE PROVIDER) SECOND-LEVEL TRIAGE: You need to be seen within the next hour. Go to the [facility] at [facility] [place] as soon as you can. * Another adult should drive. CARE ADVICE given per Dizziness - Vertigo (Adult) guideline. * If the patient cannot walk at all because of severe dizziness, then the patient may need to be transported via ambulance. * The patient or family members can arrange ambulance transport via private ambulance company or via EMS 911. Referrals [facility] - ED

## 2021-06-30 NOTE — Telephone Encounter (Signed)
Pt was seen in the ER.  FYI to provider.

## 2021-07-04 ENCOUNTER — Encounter: Payer: Self-pay | Admitting: Hematology and Oncology

## 2021-07-06 ENCOUNTER — Encounter: Payer: Self-pay | Admitting: Hematology and Oncology

## 2021-07-06 ENCOUNTER — Ambulatory Visit
Admission: RE | Admit: 2021-07-06 | Discharge: 2021-07-06 | Disposition: A | Discharge status: Home / Self Care | Payer: HMO | Source: Ambulatory Visit | Attending: Hematology and Oncology | Admitting: Hematology and Oncology

## 2021-07-06 DIAGNOSIS — Z1231 Encounter for screening mammogram for malignant neoplasm of breast: Secondary | ICD-10-CM | POA: Diagnosis not present

## 2021-07-06 DIAGNOSIS — Z853 Personal history of malignant neoplasm of breast: Secondary | ICD-10-CM

## 2021-07-07 ENCOUNTER — Ambulatory Visit (INDEPENDENT_AMBULATORY_CARE_PROVIDER_SITE_OTHER): Payer: HMO | Admitting: Family

## 2021-07-07 ENCOUNTER — Encounter: Payer: Self-pay | Admitting: Family

## 2021-07-07 ENCOUNTER — Encounter (HOSPITAL_COMMUNITY): Payer: Self-pay | Admitting: Cardiology

## 2021-07-07 VITALS — BP 100/60 | HR 94 | Temp 98.1°F | Ht 62.0 in | Wt 132.4 lb

## 2021-07-07 DIAGNOSIS — R42 Dizziness and giddiness: Secondary | ICD-10-CM | POA: Diagnosis not present

## 2021-07-07 DIAGNOSIS — R300 Dysuria: Secondary | ICD-10-CM | POA: Diagnosis not present

## 2021-07-07 DIAGNOSIS — H9319 Tinnitus, unspecified ear: Secondary | ICD-10-CM | POA: Diagnosis not present

## 2021-07-07 NOTE — Progress Notes (Signed)
Margaret Norman is a 71 y.o. female with the following history as recorded in EpicCare:  Patient Active Problem List   Diagnosis Date Noted   Multiple thyroid nodules 09/20/2020   Port-A-Cath in place 06/10/2019   Breast cancer ([place]) 04/15/2019   Second degree AV block    Malignant neoplasm of upper-outer quadrant of right breast in female, estrogen receptor positive ([place]) 03/25/2019   Thyromegaly 01/10/2018   Overweight (BMI 25.0-29.9) 08/13/2017   Upper respiratory tract infection 07/30/2017   Type 2 diabetes mellitus with hyperglycemia, with long-term current use of insulin ([place]) 03/19/2017   Thiamine deficiency 01/04/2017   Tinnitus aurium, 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   Deficiency anemia 04/30/2016   Routine general medical examination at a health care facility 04/27/2015   Abdominal wall hernia 04/26/2015   Family history of hemochromatosis 09/13/2014   Acute maxillary sinusitis 05/14/2014   Personal history of colonic polyps 03/10/2014   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   Lumbosacral spondylosis without myelopathy 05/12/2013   Insomnia, persistent 02/04/2013   Obesity (BMI 30-39.9) 02/04/2013   Unspecified asthma, with exacerbation 09/22/2012   Visit for screening mammogram 07/04/2012   Nephrolithiasis    HTN (hypertension)    Gallstones    LBBB (left bundle branch block)    Leukocytosis 12/20/2008   Hyperlipidemia with target LDL less than 100 09/04/2007   Chronic depression 09/04/2007   GERD 09/04/2007    Current Outpatient Medications  Medication Sig Dispense Refill   acetaminophen (TYLENOL) 500 MG tablet Take 1,000 mg by mouth every 6 (six) hours as needed for moderate pain.     anastrozole (ARIMIDEX) 1 MG tablet Take 1 tablet (1 mg total) by mouth daily. 90 tablet 3   aspirin 81 MG EC tablet Take 81 mg by mouth daily.     Blood Glucose Monitoring Suppl (ONETOUCH VERIO FLEX SYSTEM) W/DEVICE KIT 1 kit by Does not apply route 3 (three) times daily. 1 kit 1    buPROPion (WELLBUTRIN XL) 150 MG 24 hr tablet TAKE 1 TABLET BY MOUTH DAILY IN THE MORNING 90 tablet 1   carvedilol (COREG) 12.5 MG tablet Take 1 tablet (12.5 mg total) by mouth 2 (two) times daily. 180 tablet 3   Cholecalciferol (VITAMIN D) 50 MCG (2000 UT) tablet Take 2,000 Units by mouth daily.     Continuous Blood Gluc Receiver (FREESTYLE LIBRE 2 READER) DEVI 1 each by Does not apply route daily. 1 each 0   Continuous Blood Gluc Sensor (FREESTYLE LIBRE 2 SENSOR) MISC 1 each by Does not apply route every 14 (fourteen) days. 6 each 3   ENTRESTO 49-51 MG TAKE 1 TABLET BY MOUTH TWICE DAILY 180 tablet 3   fluticasone (FLONASE) 50 MCG/ACT nasal spray Place 2 sprays into both nostrils daily. 9.9 g 0   glucose blood (ONETOUCH VERIO) test strip Test three times daily as directed. DX: E11.9 300 each 3   Insulin Pen Needle 32G X 4 MM MISC 1 each by Other route daily. E11.9; THIS IS THE CORRECT PEN NEEDLE. PLEASE FILL RX AS ORDERED 100 each 3   JARDIANCE 25 MG TABS tablet TAKE 1 TABLET BY MOUTH DAILY 90 tablet 3   LANTUS SOLOSTAR 100 UNIT/ML Solostar Pen INJECT SUBCUTANEOUSLY 30  UNITS AT BEDTIME 30 mL 3   meclizine (ANTIVERT) 25 MG tablet Take 25 mg by mouth 3 (three) times daily as needed for dizziness.     metFORMIN (GLUCOPHAGE) 1000 MG tablet TAKE 1 TABLET BY MOUTH TWICE DAILY WITH A MEAL 180 tablet  3   omeprazole (PRILOSEC) 40 MG capsule TAKE 1 CAPSULE BY MOUTH 30-60 MINUTES PRIOR TO BREAKFAST AND DINNER 60 capsule 3   OneTouch Delica Lancets 67E MISC 1 each by Does not apply route daily. E11.9 100 each 0   Probiotic Product (PROBIOTIC ADVANCED PO) Take 1 tablet by mouth at bedtime.      rosuvastatin (CRESTOR) 10 MG tablet Take 1 tablet (10 mg total) by mouth daily. 90 tablet 1   sertraline (ZOLOFT) 100 MG tablet Take 1 tablet (100 mg total) by mouth daily. 90 tablet 3   spironolactone (ALDACTONE) 25 MG tablet Take 1 tablet (25 mg total) by mouth at bedtime. 90 tablet 3   TRULICITY 3 LF/8.1OF SOPN  INJECT 3 MG INTO THE SKIN  WEEKLY 6 mL 1   vitamin B-12 (CYANOCOBALAMIN) 100 MCG tablet Take 100 mcg by mouth daily.     Vitamin E 180 MG CAPS Take 180 mg by mouth at bedtime.      No current facility-administered medications for this visit.    Allergies: Invokana [canagliflozin] and Amoxicillin  Past Medical History:  Diagnosis Date   Arthritis    Chest pain    Nuclear, adenosine,  December, 2013, low risk nuclear scan with small, moderate in intensity, fixed anteroseptal defect. This is possibly related to an LBBB versus small prior infarct. 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 w/appy   CHOLECYSTECTOMY     ESOPHAGOGASTRODUODENOSCOPY     HERNIA REPAIR     KNEE ARTHROSCOPY Right 11/2005   LIVER BIOPSY     PANCREATIC CYST EXCISION     PORT-A-CATH REMOVAL Left 04/25/2020   Procedure: REMOVAL PORT-A-CATH;  Surgeon: Coralie Keens, MD;  Location: [facility];  Service: General;  Laterality: Left;   PORTACATH PLACEMENT Left 04/15/2019   Procedure: INSERTION PORT-A-CATH WITH ULTRASOUND;  Surgeon: Coralie Keens, MD;  Location: [facility];  Service: General;  Laterality: Left;   RIGHT/LEFT HEART CATH AND CORONARY ANGIOGRAPHY N/A 09/28/2019   Procedure: RIGHT/LEFT HEART CATH AND CORONARY ANGIOGRAPHY;  Surgeon: Larey Dresser, MD;  Location: [facility];  Service: Cardiovascular;  Laterality: N/A;   SPLENECTOMY     TONSILLECTOMY AND ADENOIDECTOMY      Family History  Problem Relation Age of Onset   Liver disease Mother    Dementia Mother    Diabetes Mother        borderline   Coronary artery disease Father    Heart attack Father    Hypertension Father  Heart disease Father    Cancer Other        leukemia   Stroke Maternal Grandfather    Hyperlipidemia Brother        Amyloidosis   Diabetes Maternal Grandmother    Cancer Paternal Uncle        unknown   Heart attack Paternal Grandmother    Heart attack Paternal Uncle    Hypertension Brother    Diabetes Daughter        borderline   Colon cancer Neg Hx    Esophageal cancer Neg Hx    Rectal cancer Neg Hx    Stomach cancer Neg Hx     Social History   Tobacco Use   Smoking status: Never   Smokeless tobacco: Never  Substance Use Topics   Alcohol use: No    Subjective:   Seen in ER last week with concerns for recurrent episodes of vertigo; does describe sensation as room spinning around her; occasional nausea; also complaining of worsening tinnitus;   Has had some episodes of low blood sugar recently- only taking Metformin once daily- has been taking at night and insulin at night; Jardiance in am; Trulicity weekly;   In general, blood pressure has not been low like seen today; admits has not eaten much today; "just don't feel good." Does mention that feels that urine has a strong smell;     Objective:  Vitals:   07/07/21 1529 07/07/21 1614  BP: (!) 88/54 100/60  Pulse: 94   Temp: 98.1 F (36.7 C)   TempSrc: Oral   SpO2: 98%   Weight: 132 lb 6.4 oz (60.1 kg)   Height: 5' 2"  (1.575 m)     General: Well developed,  well nourished, in no acute distress  Skin : Warm and dry.  Head: Normocephalic and atraumatic  Eyes: Sclera and conjunctiva clear; pupils round and reactive to light; extraocular movements intact  Ears: External normal; canals clear; tympanic membranes normal  Oropharynx: Pink, supple. No suspicious lesions  Neck: Supple without thyromegaly, adenopathy  Lungs: Respirations unlabored; clear to auscultation bilaterally without wheeze, rales, rhonchi  CVS exam: normal rate and regular rhythm.  Abdomen: Soft; nontender; nondistended; normoactive bowel sounds; no masses or hepatosplenomegaly  Musculoskeletal: No deformities; no active joint inflammation  Extremities: No edema, cyanosis, clubbing  Vessels: Symmetric bilaterally  Neurologic: Alert and oriented; speech intact; face symmetrical; moves all extremities well; CNII-XII intact without focal deficit  Assessment:  1. Vertigo   2. Tinnitus, unspecified laterality   3. Dysuria     Plan:  Refer for ENT consult and vestibular therapy;  3.   Check CBC, CMP, urine culture today; 4.   Due to nighttime low blood sugars recently, she is asked to hold Metformin tonight and start taking in the am with Jardiance; continue insulin at night;   Continue with endocrinology and cardiology as scheduled.  Time spent 30 minutes  This visit occurred during the SARS-CoV-2 public health emergency.  Safety protocols were in place, including screening questions prior to the visit, additional usage of staff PPE, and extensive cleaning of exam room while observing appropriate contact time as indicated for disinfecting solutions.    No follow-ups on file.  Orders Placed This Encounter  Procedures   Urine Culture   CBC with Differential/Platelet   Comp Met (CMET)   Ambulatory referral to ENT    Referral Priority:   Routine    Referral Type:   Consultation    Referral Reason:  Specialty Services Required    Requested Specialty:   Otolaryngology     Number of Visits Requested:   1   Ambulatory Referral to Neuro Rehab    Referral Priority:   Routine    Referral Type:   Consultation    Number of Visits Requested:   1    Requested Prescriptions    No prescriptions requested or ordered in this encounter

## 2021-07-07 NOTE — Patient Instructions (Signed)
Please do not take Metformin tonight; please move that to the morning with your Jardiance; continue your insulin at night.  I have put in a referral to ENT for you. I am also putting in a referral for the PT we discussed.   Today we are re-checking labs and ruling out UTI; please try the Gatorade Zero ( no sugar) and try to eat more today; I will be in touch once I get those results.

## 2021-07-08 ENCOUNTER — Encounter (HOSPITAL_COMMUNITY): Payer: Self-pay | Admitting: Cardiology

## 2021-07-08 ENCOUNTER — Encounter: Payer: Self-pay | Admitting: Hematology and Oncology

## 2021-07-10 ENCOUNTER — Other Ambulatory Visit: Payer: HMO

## 2021-07-10 DIAGNOSIS — R300 Dysuria: Secondary | ICD-10-CM | POA: Diagnosis not present

## 2021-07-10 NOTE — Addendum Note (Signed)
Addended by: Kelle Darting A on: 07/10/2021 08:10 AM   Modules accepted: Orders

## 2021-07-10 NOTE — Progress Notes (Signed)
Urine recollection, no charge.

## 2021-07-11 ENCOUNTER — Ambulatory Visit: Payer: HMO | Attending: Family | Admitting: Physical Therapy

## 2021-07-11 ENCOUNTER — Other Ambulatory Visit: Payer: Self-pay

## 2021-07-11 ENCOUNTER — Encounter: Payer: Self-pay | Admitting: Physical Therapy

## 2021-07-11 VITALS — BP 100/74 | HR 83

## 2021-07-11 DIAGNOSIS — H8111 Benign paroxysmal vertigo, right ear: Secondary | ICD-10-CM | POA: Insufficient documentation

## 2021-07-11 DIAGNOSIS — R262 Difficulty in walking, not elsewhere classified: Secondary | ICD-10-CM | POA: Insufficient documentation

## 2021-07-11 DIAGNOSIS — R2681 Unsteadiness on feet: Secondary | ICD-10-CM | POA: Insufficient documentation

## 2021-07-11 DIAGNOSIS — R42 Dizziness and giddiness: Secondary | ICD-10-CM | POA: Insufficient documentation

## 2021-07-12 ENCOUNTER — Other Ambulatory Visit: Payer: Self-pay | Admitting: Family

## 2021-07-12 ENCOUNTER — Other Ambulatory Visit: Payer: Self-pay | Admitting: *Deleted

## 2021-07-12 ENCOUNTER — Encounter: Payer: Self-pay | Admitting: Physical Therapy

## 2021-07-12 DIAGNOSIS — D72829 Elevated white blood cell count, unspecified: Secondary | ICD-10-CM

## 2021-07-12 DIAGNOSIS — R300 Dysuria: Secondary | ICD-10-CM

## 2021-07-12 LAB — URINE CULTURE
MICRO NUMBER:: 12875181
SPECIMEN QUALITY:: ADEQUATE

## 2021-07-12 MED ORDER — SULFAMETHOXAZOLE-TRIMETHOPRIM 800-160 MG PO TABS: 1.0000 | ORAL_TABLET | Freq: Two times a day (BID) | ORAL | 0 refills | Status: DC

## 2021-07-12 NOTE — Therapy (Signed)
[facility] [address] [place], [place], [address] Phone: [phone]   Fax:  [phone]  Physical Therapy Evaluation  Patient Details  Name: Margaret Norman MRN: [id number] Date of Birth: March 03, 1951 Referring Provider (PT): [name], FNP   Encounter Date: 07/11/2021   PT End of Session - 07/12/21 1109     Visit Number 1    Number of Visits 13    Date for PT Re-Evaluation 08/26/21    Authorization Type HTA 2023; $20 copay    PT Start Time 1325    PT Stop Time 1405    PT Time Calculation (min) 40 min    Activity Tolerance Patient tolerated treatment well    Behavior During Therapy [facility] for tasks assessed/performed             Past Medical History:  Diagnosis Date   Arthritis    Chest pain    Nuclear, adenosine,  December, 2013, low risk nuclear scan with small, moderate in intensity, fixed anteroseptal defect. This is possibly related to an LBBB versus small prior infarct. 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 w/appy    CHOLECYSTECTOMY     ESOPHAGOGASTRODUODENOSCOPY     HERNIA REPAIR     KNEE ARTHROSCOPY Right 11/2005   LIVER BIOPSY     PANCREATIC CYST EXCISION     PORT-A-CATH REMOVAL Left 04/25/2020   Procedure: REMOVAL PORT-A-CATH;  Surgeon: Coralie Keens, MD;  Location: [facility];  Service: General;  Laterality: Left;   PORTACATH PLACEMENT Left 04/15/2019   Procedure: INSERTION PORT-A-CATH WITH ULTRASOUND;  Surgeon: Coralie Keens, MD;  Location: [facility];  Service: General;  Laterality: Left;   RIGHT/LEFT HEART CATH AND CORONARY ANGIOGRAPHY N/A 09/28/2019   Procedure: RIGHT/LEFT HEART CATH AND CORONARY ANGIOGRAPHY;  Surgeon: Larey Dresser, MD;  Location: [facility];  Service: Cardiovascular;  Laterality: N/A;   SPLENECTOMY     TONSILLECTOMY AND ADENOIDECTOMY      Vitals:   07/11/21 1335  BP: 100/74  Pulse: 83      Subjective Assessment - 07/11/21 1326     Subjective Dizziness began a while ago; got up one night to go get a drink and the dizziness caused her to fall backwards; required husband's assistance to get up off the floor, had to spend the rest of the day in bed.  Second major episode occurred while at the beach, did get sick and vomited.  Third episode pt's husband urged her to go to the  ER.  No other major episodes but feels woozy.  Most of the time they occur in the AM.  Has tinnitus; no significant change in tinnitus but has noticed some decline in hearing.  No recent changes to vision.  Does get HA with the dizziness.  Reports BP has been dropping very low recently.    Pertinent History OA, chest pain, CHF, depression, gallstones, kidney stones, HTN, HLD, kidney mass, LBBB, PNA, type II DM, R breast cancer with breast lumpectomy and partial mastectomy with radioactive seed, abdominal wall hernia repair, knee arthroscopy, tinnitus    Diagnostic tests CT scan    Currently in Pain? No/denies                [facility] PT Assessment - 07/11/21 1336        Assessment   Medical Diagnosis Vertigo    Referring Provider (PT) [name], FNP    Onset Date/Surgical Date 07/07/21    Prior Therapy not for dizziness      Precautions   Precautions Other (comment)    Precaution Comments OA, chest pain, CHF, depression, gallstones, kidney stones, HTN, HLD, kidney mass, LBBB, PNA, type II DM, R breast cancer with breast lumpectomy and partial mastectomy with radioactive seed, abdominal wall hernia repair, knee arthroscopy, tinnitus      Balance Screen   Has the patient fallen in the past 6 months Yes    How many times? [place] residence    Living Arrangements Spouse/significant other;Children    Type of [place] level    Additional Comments daughter has been helping with laundry and cleaning due to pt's dizziness; daughter drove pt today      Prior Function   Level of Independence Independent      Observation/Other Assessments   Focus on Therapeutic Outcomes (FOTO)  Not captured by staff      Sensation   Light Touch Appears Intact    Additional Comments intermittent tingling in hands and feet      Coordination   Gross Motor Movements are Fluid and Coordinated Yes    Finger Nose Finger Test WFL      ROM / Strength   AROM / PROM / Strength Strength      Strength   Overall Strength Within functional limits for tasks performed                    Vestibular Assessment - 07/11/21 1341       Symptom Behavior   Subjective history of current problem see clinical history    Type of Dizziness  Spinning    Frequency of Dizziness 3 episodes    Duration of Dizziness 30-45 minutes to half a day    Symptom Nature Motion provoked    Aggravating Factors Mornings;Supine to sit    Relieving Factors Comments   later in the day   Progression of Symptoms No change since onset      Oculomotor Exam   Ocular ROM WFL    Spontaneous Absent    Gaze-induced  Absent     Smooth Pursuits Saccades    Saccades Slow   headache looking up     Oculomotor Exam-Fixation Suppressed    Left Head Impulse not performed this session    Right Head Impulse not performed this session      Vestibulo-Ocular Reflex   VOR to Slow Head  Movement Normal    VOR Cancellation Comment   dizziness     Positional Testing   Dix-Hallpike Dix-Hallpike Right;Dix-Hallpike Left    Horizontal Canal Testing Horizontal Canal Right;Horizontal Canal Left      Dix-Hallpike Right   Dix-Hallpike Right Duration 30-45 seconds    Dix-Hallpike Right Symptoms Other (comment)   changing between R and L torsional, up beating     Dix-Hallpike Left   Dix-Hallpike Left Duration 0    Dix-Hallpike Left Symptoms No nystagmus      Horizontal Canal Right   Horizontal Canal Right Duration 0    Horizontal Canal Right Symptoms Normal      Horizontal Canal Left   Horizontal Canal Left Duration 0    Horizontal Canal Left Symptoms Normal                Objective measurements completed on examination: See above findings.        Vestibular Treatment/Exercise - 07/11/21 1353       Vestibular Treatment/Exercise   Vestibular Treatment Provided Canalith Repositioning    Canalith Repositioning Epley Manuever Right       EPLEY MANUEVER RIGHT   Number of Reps  1    Overall Response No change                    PT Education - 07/12/21 1109     Education Details clinical findings, etiology of BPPV, treatment of BPPV, PT POC and goals    Person(s) Educated Patient    Methods Explanation    Comprehension Verbalized understanding              PT Short Term Goals - 07/12/21 1117       PT SHORT TERM GOAL #1   Title Pt will demonstrate full resolution of R posterior canal BPPV    Time 3    Period Weeks    Status New    Target Date 08/02/21      PT SHORT TERM GOAL #2   Title Pt will participate in further assessment of vestibular system, balance and falls risk     Baseline Need to assess HIT/DVA, MSQ, FGA, orthostatics due to low BP    Time 3    Period Weeks    Status New    Target Date 08/02/21      PT SHORT TERM GOAL #3   Title Pt will initiate vestibular and balance HEP    Time 3    Period Weeks    Status New    Target Date 08/02/21               PT Long Term Goals - 07/12/21 1118       PT LONG TERM GOAL #1   Title Pt will demonstrate independence with final vestibular and balance HEP    Time 6    Period Weeks    Status New    Target Date 08/26/21      PT LONG TERM GOAL #2   Title Pt will demonstrate 4 point improvement in FGA to indicate decreased falls risk    Baseline TBA    Time 6    Period Weeks    Status New    Target Date 08/26/21      PT LONG TERM GOAL #3   Title Pt will report 0/5 dizziness for all movements on MSQ to allow pt to safely return to independent driving and household tasks.    Baseline TBA  Time 6    Period Weeks    Status New    Target Date 08/26/21      PT LONG TERM GOAL #4   Title DVA goal if indicated      PT LONG TERM GOAL #5   Title Education for orthostatics if needed                    Plan - 07/12/21 1110     Clinical Impression Statement Pt is a 71 year old female referred to Neuro OPPT for evaluation of vertigo; one episode resulting in a fall.  Pt's PMH is significant for the following: OA, chest pain, CHF, depression, gallstones, kidney stones, HTN, HLD, kidney mass, LBBB, PNA, type II DM, R breast cancer with breast lumpectomy and partial mastectomy with radioactive seed, abdominal wall hernia repair, knee arthroscopy, tinnitus. The following deficits were noted during pt's exam: upbeating, R torsional nystagmus of short duration during R dix-hallpike which indicates R posterior canal canalithiasis, motion sensitivity to horizontal and vertical head movements, impaired standing balance, and difficulty with walking due to disequilibrium.  Pt has experienced a fall due to  dizziness and would benefit from skilled PT to address these impairments and functional limitations to maximize functional mobility independence and reduce falls risk.    Personal Factors and Comorbidities Comorbidity 3+;Past/Current Experience    Comorbidities OA, chest pain, CHF, depression, gallstones, kidney stones, HTN, HLD, kidney mass, LBBB, PNA, type II DM, R breast cancer with breast lumpectomy and partial mastectomy with radioactive seed, abdominal wall hernia repair, knee arthroscopy, tinnitus    Examination-Activity Limitations Bed Mobility;Locomotion Level;Transfers;Bend;Reach Overhead    Examination-Participation Restrictions Cleaning;Driving;Laundry;Meal Prep    Stability/Clinical Decision Making Stable/Uncomplicated    Clinical Decision Making Low    Rehab Potential Good    PT Frequency 2x / week    PT Duration 6 weeks    PT Treatment/Interventions ADLs/Self Care Home Management;Canalith Repostioning;Gait training;Functional mobility training;Therapeutic activities;Therapeutic exercise;Balance training;Neuromuscular re-education;Patient/family education;Vestibular    PT Next Visit Plan Re-assess and treat R BPPV; if clear and pt is still experiencing symptoms check HIT/DVA, MSQ, FGA, orthostatics.    Consulted and Agree with Plan of Care Patient             Patient will benefit from skilled therapeutic intervention in order to improve the following deficits and impairments:  Decreased balance, Decreased mobility, Difficulty walking, Dizziness  Visit Diagnosis: BPPV (benign paroxysmal positional vertigo), right  Dizziness and giddiness  Unsteadiness on feet  Difficulty in walking, not elsewhere classified     Problem List Patient Active Problem List   Diagnosis Date Noted   Multiple thyroid nodules 09/20/2020   Port-A-Cath in place 06/10/2019   Breast cancer ([place]) 04/15/2019   Second degree AV block    Malignant neoplasm of upper-outer quadrant of right breast  in female, estrogen receptor positive ([place]) 03/25/2019   Thyromegaly 01/10/2018   Overweight (BMI 25.0-29.9) 08/13/2017   Upper respiratory tract infection 07/30/2017   Type 2 diabetes mellitus with hyperglycemia, with long-term current use of insulin ([place]) 03/19/2017   Thiamine deficiency 01/04/2017   Tinnitus aurium, 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   Deficiency anemia 04/30/2016   Routine general medical examination at a health care facility 04/27/2015   Abdominal wall hernia 04/26/2015   Family history of hemochromatosis 09/13/2014   Acute maxillary sinusitis 05/14/2014   Personal history of colonic polyps 03/10/2014   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 [address] [place], [place], [address] Phone: [phone]   Fax:  [phone]  Name: Margaret Norman MRN: [id number] Date of Birth: Jan 06, 1951

## 2021-07-13 ENCOUNTER — Other Ambulatory Visit: Payer: Self-pay

## 2021-07-13 ENCOUNTER — Encounter: Payer: Self-pay | Admitting: Hematology and Oncology

## 2021-07-13 ENCOUNTER — Ambulatory Visit: Payer: HMO | Admitting: Physical Therapy

## 2021-07-13 DIAGNOSIS — R262 Difficulty in walking, not elsewhere classified: Secondary | ICD-10-CM

## 2021-07-13 DIAGNOSIS — R2681 Unsteadiness on feet: Secondary | ICD-10-CM

## 2021-07-13 DIAGNOSIS — R42 Dizziness and giddiness: Secondary | ICD-10-CM

## 2021-07-13 DIAGNOSIS — H8111 Benign paroxysmal vertigo, right ear: Secondary | ICD-10-CM

## 2021-07-13 LAB — CBC WITH DIFFERENTIAL/PLATELET
Absolute Monocytes: 1353 cells/uL — ABNORMAL HIGH (ref 200–950)
Basophils Absolute: 76 cells/uL (ref 0–200)
Basophils Relative: 0.5 %
Eosinophils Absolute: 714 cells/uL — ABNORMAL HIGH (ref 15–500)
Eosinophils Relative: 4.7 %
HCT: 38 % (ref 35.0–45.0)
Hemoglobin: 12.3 g/dL (ref 11.7–15.5)
Lymphs Abs: 4697 cells/uL — ABNORMAL HIGH (ref 850–3900)
MCH: 29.1 pg (ref 27.0–33.0)
MCHC: 32.4 g/dL (ref 32.0–36.0)
MCV: 90 fL (ref 80.0–100.0)
MPV: 12.2 fL (ref 7.5–12.5)
Monocytes Relative: 8.9 %
Neutro Abs: 8360 cells/uL — ABNORMAL HIGH (ref 1500–7800)
Neutrophils Relative %: 55 %
Platelets: 446 10*3/uL — ABNORMAL HIGH (ref 140–400)
RBC: 4.22 10*6/uL (ref 3.80–5.10)
RDW: 12.9 % (ref 11.0–15.0)
Total Lymphocyte: 30.9 %
WBC: 15.2 10*3/uL — ABNORMAL HIGH (ref 3.8–10.8)

## 2021-07-13 LAB — URINE CULTURE

## 2021-07-13 LAB — COMPREHENSIVE METABOLIC PANEL
AG Ratio: 2 (calc) (ref 1.0–2.5)
ALT: 6 U/L (ref 6–29)
AST: 10 U/L (ref 10–35)
Albumin: 4.3 g/dL (ref 3.6–5.1)
Alkaline phosphatase (APISO): 72 U/L (ref 37–153)
BUN: 19 mg/dL (ref 7–25)
CO2: 23 mmol/L (ref 20–32)
Calcium: 10.4 mg/dL (ref 8.6–10.4)
Chloride: 108 mmol/L (ref 98–110)
Creat: 0.86 mg/dL (ref 0.60–1.00)
Globulin: 2.2 g/dL (calc) (ref 1.9–3.7)
Glucose, Bld: 148 mg/dL — ABNORMAL HIGH (ref 65–99)
Potassium: 4.3 mmol/L (ref 3.5–5.3)
Sodium: 145 mmol/L (ref 135–146)
Total Bilirubin: 0.3 mg/dL (ref 0.2–1.2)
Total Protein: 6.5 g/dL (ref 6.1–8.1)

## 2021-07-13 NOTE — Therapy (Signed)
[facility] [address] [place], [place], [address] Phone: [phone]   Fax:  [phone]  Physical Therapy Treatment  Patient Details  Name: Margaret Norman MRN: [id number] Date of Birth: 09-27-1950 Referring Provider (PT): [name], FNP   Encounter Date: 07/13/2021   PT End of Session - 07/13/21 1549     Visit Number 2    Number of Visits 13    Date for PT Re-Evaluation 08/26/21    Authorization Type HTA 2023; $20 copay    PT Start Time 1536    PT Stop Time 1608    PT Time Calculation (min) 32 min    Activity Tolerance Patient tolerated treatment well    Behavior During Therapy [facility] for tasks assessed/performed             Past Medical History:  Diagnosis Date   Arthritis    Chest pain    Nuclear, adenosine,  December, 2013, low risk nuclear scan with small, moderate in intensity, fixed anteroseptal defect. This is possibly related to an LBBB versus small prior infarct. 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 w/appy    CHOLECYSTECTOMY     ESOPHAGOGASTRODUODENOSCOPY     HERNIA REPAIR     KNEE ARTHROSCOPY Right 11/2005   LIVER BIOPSY     PANCREATIC CYST EXCISION     PORT-A-CATH REMOVAL Left 04/25/2020   Procedure: REMOVAL PORT-A-CATH;  Surgeon: Coralie Keens, MD;  Location: [facility];  Service: General;  Laterality: Left;   PORTACATH PLACEMENT Left 04/15/2019   Procedure: INSERTION PORT-A-CATH WITH ULTRASOUND;  Surgeon: Coralie Keens, MD;  Location: [facility];  Service: General;  Laterality: Left;   RIGHT/LEFT HEART CATH AND CORONARY ANGIOGRAPHY N/A 09/28/2019   Procedure: RIGHT/LEFT HEART CATH AND CORONARY ANGIOGRAPHY;  Surgeon: Larey Dresser, MD;  Location: [facility];  Service: Cardiovascular;  Laterality: N/A;   SPLENECTOMY     TONSILLECTOMY AND ADENOIDECTOMY      There were no vitals filed for this visit.   Subjective Assessment - 07/13/21 1539     Subjective Felt good after evaluation until last night while lying in bed; felt like a wave of dizziness came over her.  Felt the dizziness a little this morning when she first sat up; also felt it while lying back on the couch.    Pertinent History OA, chest pain, CHF, depression, gallstones, kidney stones, HTN, HLD, kidney mass, LBBB, PNA, type II DM, R breast cancer with breast lumpectomy and partial  mastectomy with radioactive seed, abdominal wall hernia repair, knee arthroscopy, tinnitus    Diagnostic tests CT scan    Currently in Pain? No/denies                Vestibular Assessment - 07/13/21 1541       Oculomotor Exam-Fixation Suppressed    Left Head Impulse negative    Right Head Impulse negative      Positional Testing   Dix-Hallpike Dix-Hallpike Right;Dix-Hallpike Left    Horizontal Canal Testing Horizontal Canal Right;Horizontal Canal Left      Dix-Hallpike Right   Dix-Hallpike Right Duration 0    Dix-Hallpike Right Symptoms No nystagmus      Dix-Hallpike Left   Dix-Hallpike Left Duration 0     Dix-Hallpike Left Symptoms No nystagmus      Horizontal Canal Right   Horizontal Canal Right Duration 0    Horizontal Canal Right Symptoms Normal      Horizontal Canal Left   Horizontal Canal Left Duration 0    Horizontal Canal Left Symptoms Normal      Positional Sensitivities   Sit to Supine No dizziness    Supine to Left Side No dizziness    Supine to Right Side No dizziness    Supine to Sitting Lightheadedness    Right Hallpike No dizziness    Up from Right Hallpike No dizziness    Up from Left Hallpike No dizziness    Nose to Right Knee No dizziness    Right Knee to Sitting No dizziness    Nose to Left Knee No dizziness    Left Knee to Sitting No dizziness    Head Turning x 5 No dizziness    Head Nodding x 5 No dizziness    Pivot Right in Standing No dizziness    Pivot Left in Standing No dizziness    Rolling Right No dizziness    Rolling Left No dizziness      Orthostatics   BP supine (x 5 minutes) 96/62    HR supine (x 5 minutes) 79    BP sitting 91/52   lightheaded   HR sitting 84    BP standing (after 1 minute) 86/58    HR standing (after 1 minute) 86    BP standing (after 3 minutes) 91/58    HR standing (after 3 minutes) 85    Orthostatics Comment felt lightheaded when she first sat up from supine              PT Education - 07/13/21 1549     Education Details no indication of BPPV today; possible influence of hypotension - PT to alert cardiologist of hypotension.  Educated pt on safe supine > sit > stand transfers and transitioning slowly to allow BP to adjust.  Plan for next two visits    Person(s) Educated Patient    Methods Explanation    Comprehension Verbalized understanding              PT Short Term Goals - 07/12/21 1117       PT SHORT TERM GOAL #1   Title Pt will demonstrate full resolution of R posterior canal BPPV    Time 3    Period Weeks    Status New    Target Date 08/02/21      PT SHORT TERM GOAL #2   Title Pt will  participate in further assessment of vestibular system, balance and falls risk    Baseline Need to assess HIT/DVA,  MSQ, FGA, orthostatics due to low BP    Time 3    Period Weeks    Status New    Target Date 08/02/21      PT SHORT TERM GOAL #3   Title Pt will initiate vestibular and balance HEP    Time 3    Period Weeks    Status New    Target Date 08/02/21               PT Long Term Goals - 07/12/21 1118       PT LONG TERM GOAL #1   Title Pt will demonstrate independence with final vestibular and balance HEP    Time 6    Period Weeks    Status New    Target Date 08/26/21      PT LONG TERM GOAL #2   Title Pt will demonstrate 4 point improvement in FGA to indicate decreased falls risk    Baseline TBA    Time 6    Period Weeks    Status New    Target Date 08/26/21      PT LONG TERM GOAL #3   Title Pt will report 0/5 dizziness for all movements on MSQ to allow pt to safely return to independent driving and household tasks.    Baseline TBA    Time 6    Period Weeks    Status New    Target Date 08/26/21      PT LONG TERM GOAL #4   Title DVA goal if indicated      PT LONG TERM GOAL #5   Title Education for orthostatics if needed                   Plan - 07/13/21 1550     Clinical Impression Statement No indication of ongoing BPPV today with positional testing.  Continue to assess pt symptoms with orthostatics; pt does experience a 10 point drop in systolic BP from supine > standing and reports symptoms of lightheadedness with supine > sit.  If vertigo symptoms have resolved at next visit, plan to teach pt home maneuver and will likely D/C.    Personal Factors and Comorbidities Comorbidity 3+;Past/Current Experience    Comorbidities OA, chest pain, CHF, depression, gallstones, kidney stones, HTN, HLD, kidney mass, LBBB, PNA, type II DM, R breast cancer with breast lumpectomy and partial mastectomy with radioactive seed, abdominal wall hernia repair, knee  arthroscopy, tinnitus    Examination-Activity Limitations Bed Mobility;Locomotion Level;Transfers;Bend;Reach Overhead    Examination-Participation Restrictions Cleaning;Driving;Laundry;Meal Prep    Stability/Clinical Decision Making Stable/Uncomplicated    Rehab Potential Good    PT Frequency 2x / week    PT Duration 6 weeks    PT Treatment/Interventions ADLs/Self Care Home Management;Canalith Repostioning;Gait training;Functional mobility training;Therapeutic activities;Therapeutic exercise;Balance training;Neuromuscular re-education;Patient/family education;Vestibular    PT Next Visit Plan Re-assess and treat R BPPV; if clear and pt is still experiencing symptoms check HIT/DVA, MSQ, FGA, orthostatics.    Consulted and Agree with Plan of Care Patient             Patient will benefit from skilled therapeutic intervention in order to improve the following deficits and impairments:  Decreased balance, Decreased mobility, Difficulty walking, Dizziness  Visit Diagnosis: BPPV (benign paroxysmal positional vertigo), right  Dizziness and giddiness  Unsteadiness on feet  Difficulty in walking, not elsewhere classified     Problem List Patient Active Problem List   Diagnosis Date Noted   Multiple thyroid nodules  09/20/2020   Port-A-Cath in place 06/10/2019   Breast cancer ([place]) 04/15/2019   Second degree AV block    Malignant neoplasm of upper-outer quadrant of right breast in female, estrogen receptor positive ([place]) 03/25/2019   Thyromegaly 01/10/2018   Overweight (BMI 25.0-29.9) 08/13/2017   Upper respiratory tract infection 07/30/2017   Type 2 diabetes mellitus with hyperglycemia, with long-term current use of insulin ([place]) 03/19/2017   Thiamine deficiency 01/04/2017   Tinnitus aurium, 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   Deficiency anemia 04/30/2016   Routine general medical examination at a health care facility 04/27/2015   Abdominal wall hernia 04/26/2015   Family history of  hemochromatosis 09/13/2014   Acute maxillary sinusitis 05/14/2014   Personal history of colonic polyps 03/10/2014   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 [place], [place], [address] Phone: [phone]   Fax:  [phone]  Name: ERIAL FIKES MRN: [id number] Date of Birth: 1950/12/01

## 2021-07-17 ENCOUNTER — Ambulatory Visit: Payer: HMO | Admitting: Physical Therapy

## 2021-07-18 ENCOUNTER — Telehealth (HOSPITAL_COMMUNITY): Payer: Self-pay | Admitting: *Deleted

## 2021-07-18 NOTE — Telephone Encounter (Signed)
Pt called requesting an office visit with Dr.McLean because of low bp and dizziness. Pts bp 95/66. Pt said systolic bp has been consistently below 100. Pt asked to be seen in office and did not want to make med changes over the phone.  Appt scheduled.

## 2021-07-21 ENCOUNTER — Other Ambulatory Visit (INDEPENDENT_AMBULATORY_CARE_PROVIDER_SITE_OTHER): Payer: HMO

## 2021-07-21 DIAGNOSIS — R300 Dysuria: Secondary | ICD-10-CM

## 2021-07-21 DIAGNOSIS — D72829 Elevated white blood cell count, unspecified: Secondary | ICD-10-CM | POA: Diagnosis not present

## 2021-07-21 LAB — CBC WITH DIFFERENTIAL/PLATELET
Basophils Absolute: 0 10*3/uL (ref 0.0–0.1)
Basophils Relative: 0.3 % (ref 0.0–3.0)
Eosinophils Absolute: 0.4 10*3/uL (ref 0.0–0.7)
Eosinophils Relative: 3.4 % (ref 0.0–5.0)
HCT: 38.1 % (ref 36.0–46.0)
Hemoglobin: 12 g/dL (ref 12.0–15.0)
Lymphocytes Relative: 31.4 % (ref 12.0–46.0)
Lymphs Abs: 3.9 10*3/uL (ref 0.7–4.0)
MCHC: 31.6 g/dL (ref 30.0–36.0)
MCV: 90.2 fl (ref 78.0–100.0)
Monocytes Absolute: 1.5 10*3/uL — ABNORMAL HIGH (ref 0.1–1.0)
Monocytes Relative: 12.1 % — ABNORMAL HIGH (ref 3.0–12.0)
Neutro Abs: 6.6 10*3/uL (ref 1.4–7.7)
Neutrophils Relative %: 52.8 % (ref 43.0–77.0)
Platelets: 467 10*3/uL — ABNORMAL HIGH (ref 150.0–400.0)
RBC: 4.22 Mil/uL (ref 3.87–5.11)
RDW: 14.1 % (ref 11.5–15.5)
WBC: 12.6 10*3/uL — ABNORMAL HIGH (ref 4.0–10.5)

## 2021-07-22 LAB — URINE CULTURE
MICRO NUMBER:: 12929768
SPECIMEN QUALITY:: ADEQUATE

## 2021-07-24 ENCOUNTER — Other Ambulatory Visit: Payer: Self-pay

## 2021-07-24 ENCOUNTER — Encounter (HOSPITAL_COMMUNITY): Payer: Self-pay | Admitting: Cardiology

## 2021-07-24 ENCOUNTER — Ambulatory Visit (HOSPITAL_COMMUNITY)
Admission: RE | Admit: 2021-07-24 | Discharge: 2021-07-24 | Disposition: A | Discharge status: Home / Self Care | Payer: HMO | Source: Ambulatory Visit | Attending: Cardiology | Admitting: Cardiology

## 2021-07-24 VITALS — BP 110/68 | HR 81 | Wt 132.4 lb

## 2021-07-24 DIAGNOSIS — Z853 Personal history of malignant neoplasm of breast: Secondary | ICD-10-CM | POA: Insufficient documentation

## 2021-07-24 DIAGNOSIS — I428 Other cardiomyopathies: Secondary | ICD-10-CM | POA: Insufficient documentation

## 2021-07-24 DIAGNOSIS — E785 Hyperlipidemia, unspecified: Secondary | ICD-10-CM

## 2021-07-24 DIAGNOSIS — Z7984 Long term (current) use of oral hypoglycemic drugs: Secondary | ICD-10-CM | POA: Diagnosis not present

## 2021-07-24 DIAGNOSIS — Z170 Estrogen receptor positive status [ER+]: Secondary | ICD-10-CM | POA: Insufficient documentation

## 2021-07-24 DIAGNOSIS — I110 Hypertensive heart disease with heart failure: Secondary | ICD-10-CM | POA: Insufficient documentation

## 2021-07-24 DIAGNOSIS — E119 Type 2 diabetes mellitus without complications: Secondary | ICD-10-CM | POA: Diagnosis not present

## 2021-07-24 DIAGNOSIS — Z79899 Other long term (current) drug therapy: Secondary | ICD-10-CM | POA: Insufficient documentation

## 2021-07-24 DIAGNOSIS — I5022 Chronic systolic (congestive) heart failure: Secondary | ICD-10-CM | POA: Diagnosis not present

## 2021-07-24 DIAGNOSIS — C50911 Malignant neoplasm of unspecified site of right female breast: Secondary | ICD-10-CM | POA: Diagnosis not present

## 2021-07-24 DIAGNOSIS — R42 Dizziness and giddiness: Secondary | ICD-10-CM | POA: Insufficient documentation

## 2021-07-24 DIAGNOSIS — I447 Left bundle-branch block, unspecified: Secondary | ICD-10-CM | POA: Insufficient documentation

## 2021-07-24 LAB — LIPID PANEL
Cholesterol: 109 mg/dL (ref 0–200)
HDL: 46 mg/dL (ref >40)
LDL Cholesterol: 47 mg/dL (ref 0–99)
Total CHOL/HDL Ratio: 2.4 RATIO
Triglycerides: 78 mg/dL (ref <150)
VLDL: 16 mg/dL (ref 0–40)

## 2021-07-24 MED ORDER — ENTRESTO 24-26 MG PO TABS: 1.0000 | ORAL_TABLET | Freq: Two times a day (BID) | ORAL | 11 refills | Status: DC

## 2021-07-24 MED ORDER — CARVEDILOL 6.25 MG PO TABS: 6.2500 mg | ORAL_TABLET | Freq: Two times a day (BID) | ORAL | 3 refills | Status: DC

## 2021-07-24 NOTE — Patient Instructions (Addendum)
EKG done today.  Labs done today. We will contact you only if your labs are abnormal.  DECREASE Carvedilol to 6.25mg  (1 tablet) by mouth 2 times daily.  DECREASE Entresto to 24-26mg  (1 tablet) by mouth 2 times daily.   No other medication changes were made. Please continue all current medications as prescribed.  Your physician recommends that you schedule a follow-up appointment in: 2-3 weeks with our Clinic Pharmacist and in 3 months with Dr. Aundra Dubin with an echo prior to your exam.   Your physician has requested that you have an echocardiogram. Echocardiography is a painless test that uses sound waves to create images of your heart. It provides your doctor with information about the size and shape of your heart and how well your hearts chambers and valves are working. This procedure takes approximately one hour. There are no restrictions for this procedure.  If you have any questions or concerns before your next appointment please send [place] a message through [place] or call our office at [phone].    TO LEAVE A MESSAGE FOR THE NURSE SELECT OPTION 2, PLEASE LEAVE A MESSAGE INCLUDING: YOUR NAME DATE OF BIRTH CALL BACK NUMBER REASON FOR CALL**this is important as we prioritize the call backs  YOU WILL RECEIVE A CALL BACK THE SAME DAY AS LONG AS YOU CALL BEFORE 4:00 PM   Do the following things EVERYDAY: Weigh yourself in the morning before breakfast. Write it down and keep it in a log. Take your medicines as prescribed Eat low salt foods--Limit salt (sodium) to 2000 mg per day.  Stay as active as you can everyday Limit all fluids for the day to less than 2 liters   At the [facility], you and your health needs are our priority. As part of our continuing mission to provide you with exceptional heart care, we have created designated Provider Care Teams. These Care Teams include your primary Cardiologist (physician) and Advanced Practice Providers (APPs- Physician  Assistants and Nurse Practitioners) who all work together to provide you with the care you need, when you need it.   You may see any of the following providers on your designated Care Team at your next follow up: Dr Glori Bickers Dr Haynes Kerns, NP Lyda Jester, [place] Audry Riles, PharmD   Please be sure to bring in all your medications bottles to every appointment.

## 2021-07-24 NOTE — Progress Notes (Signed)
PCP: Margaret Salvage, FNP Oncology: Dr. Lindi Norman HF cardiology: Dr. Aundra Norman   71 y.o. with history of DM, chronic LBBB, and breast cancer was referred by Dr. Lindi Norman for workup of fall in EF.  She was diagnosed with right breast cancer, ER+/PR+/HER2+ in 9/20.  She had right lumpectomy in 10/20.  Starting in 11/20, she had been getting 12 cycles of Taxol/Herceptin to be followed by Herceptin alone to complete a year. Echo in 10/20 showed EF 55-60% per report (looked more like 50% on my review, likely due to septal-lateral dyssynchrony).  Her repeat echo for Herceptin screening was done in 1/21, showing EF down to 30-35% with septal-lateral dyssynchrony.  Herceptin has been stopped for now.  She is currently undergoing radiation therapy.   Echo in 3/21 showed that EF remains 30-35% with prominent septal-lateral dyssynchrony.  Patient has a strong family history of heart problems.  Father died from "ruptured aorta."  Sister had heart valve repair, one brother has a pacemaker, and another brother has AL amyloidosis.   LHC/RHC was done in 4/21. This showed normal filling pressures, preserved cardiac output, and no significant CAD. Cardiac MRI in 5/21 showed EF 36% with septal-lateral dyssynchrony, RV EF normal 45%, no LGE.  Echo in 10/21 showed EF 40-45% with septal-lateral dyssynchrony.   Echo in 4/22 showed EF 45% with septal-lateral dyssynchrony, normal RV.   She returns today for followup of cardiomyopathy.  Weight is down 3 lbs.  She has continued to have dizziness/lightheadedness.  She was seen in the ER for this recently and thought to have vertigo.  However, meclizine did not help. She says that she gets dizzy when she stands up from sitting.  She feels generally fatigued but denies exertional dyspnea.  She has occasional mild atypical chest pain.   Labs (1/21): K 3.8, creatinine 0.75 Labs (3/21): K 4.5, creatinine 0.71 Labs (4/21): K 4.3, creatinine 0.82 => 0.83 Labs (8/21): hgb 12.7, K 4.7,  creatinine 0.85 Labs (10/21): K 4.2, creatinine 0.92 Labs (1/22): LDL 37, HDL 47, K 4.3, creatinine 0.77 Labs (1/23): K 4.3, creatinine 0.86, hgb 12  ECG (personally reviewed): NSR, LBBB 124 msec  PMH: 1. Depression 2. Type 2 diabetes 3. Chronic LBBB 4. H/o transient complete heart block: in 10/20 when she was having port-a-cath placed.  5. HTN 6. Hyperlipidemia.  7. Breast cancer: She was diagnosed with right breast cancer, ER+/PR+/HER2+ in 9/20.  She had right lumpectomy in 10/20.  Starting in 11/20, she has been getting 12 cycles of Taxol/Herceptin to be followed by Herceptin alone to complete a year.  8. Chronic systolic CHF: She has had mildly decreased EF in the past, possibly related to LBBB.  Nonischemic cardiomyopathy.  - Echo (4/17): EF 50-55% - Cardiolite (4/17): EF 48% - Echo (10/20): EF 55-60% (reviewed, think more like 50% but technically difficult).  - Echo (1/21): EF 30-35%, mild LVH, septal dyssynchrony.  - Echo (3/21): EF 30-35% with prominent septal-lateral dyssynchrony.  - LHC/RHC (4/21): mean RA 1, PA 13/3, mean PCWP 2, CI 3.74, no significant CAD.  - Cardiac MRI (5/21): EF 36% with septal-lateral dyssynchrony, RV EF normal 45%, no LGE.  - Echo (10/21): EF 40-45%, septal-lateral dyssynchrony.  - Echo (4/22): EF 45% with septal-lateral dyssynchrony, normal RV.  Social History   Socioeconomic History   Marital status: Married    Spouse name: Margaret Norman   Number of children: 2   Years of education: Not on file   Highest education level: Not on file  Occupational History   Occupation: CSR    Employer: [employer]   Occupation: retired  Tobacco Use   Smoking status: Never   Smokeless tobacco: Never  Vaping Use   Vaping Use: Never used  Substance and Sexual Activity   Alcohol use: No   Drug use: No   Sexual activity: Not Currently  Other Topics Concern   Not on file  Social History Narrative   Regular Exercise -  NO   Social Determinants of  Health   Financial Resource Strain: Not on file  Food Insecurity: Not on file  Transportation Needs: Not on file  Physical Activity: Not on file  Stress: Not on file  Social Connections: Not on file  Intimate Partner Violence: Not on file   Family History  Problem Relation Age of Onset   Liver disease Mother    Dementia Mother    Diabetes Mother        borderline   Coronary artery disease Father    Heart attack Father    Hypertension Father    Heart disease Father    Cancer Other        leukemia   Stroke Maternal Grandfather    Hyperlipidemia Brother        Amyloidosis   Diabetes Maternal Grandmother    Cancer Paternal Uncle        unknown   Heart attack Paternal Grandmother    Heart attack Paternal Uncle    Hypertension Brother    Diabetes Daughter        borderline   Colon cancer Neg Hx    Esophageal cancer Neg Hx    Rectal cancer Neg Hx    Stomach cancer Neg Hx    Current Outpatient Medications  Medication Sig Dispense Refill   acetaminophen (TYLENOL) 500 MG tablet Take 1,000 mg by mouth every 6 (six) hours as needed for moderate pain.     anastrozole (ARIMIDEX) 1 MG tablet Take 1 tablet (1 mg total) by mouth daily. 90 tablet 3   aspirin 81 MG EC tablet Take 81 mg by mouth daily.     Blood Glucose Monitoring Suppl (ONETOUCH VERIO FLEX SYSTEM) W/DEVICE KIT 1 kit by Does not apply route 3 (three) times daily. 1 kit 1   buPROPion (WELLBUTRIN XL) 150 MG 24 hr tablet TAKE 1 TABLET BY MOUTH DAILY IN THE MORNING 90 tablet 1   Cholecalciferol (VITAMIN D) 50 MCG (2000 UT) tablet Take 2,000 Units by mouth daily.     Continuous Blood Gluc Receiver (FREESTYLE LIBRE 2 READER) DEVI 1 each by Does not apply route daily. 1 each 0   fluticasone (FLONASE) 50 MCG/ACT nasal spray Place into both nostrils as needed for allergies or rhinitis.     glucose blood (ONETOUCH VERIO) test strip Test three times daily as directed. DX: E11.9 300 each 3   Insulin Pen Needle 32G X 4 MM MISC 1 each  by Other route daily. E11.9; THIS IS THE CORRECT PEN NEEDLE. PLEASE FILL RX AS ORDERED 100 each 3   JARDIANCE 25 MG TABS tablet TAKE 1 TABLET BY MOUTH DAILY 90 tablet 3   LANTUS SOLOSTAR 100 UNIT/ML Solostar Pen INJECT SUBCUTANEOUSLY 30  UNITS AT BEDTIME 30 mL 3   meclizine (ANTIVERT) 25 MG tablet Take 25 mg by mouth 3 (three) times daily as needed for dizziness.     metFORMIN (GLUCOPHAGE) 1000 MG tablet TAKE 1 TABLET BY MOUTH TWICE DAILY WITH A MEAL 180 tablet 3  omeprazole (PRILOSEC) 40 MG capsule Take 40 mg by mouth daily.     OneTouch Delica Lancets 49E MISC 1 each by Does not apply route daily. E11.9 100 each 0   Probiotic Product (PROBIOTIC ADVANCED PO) Take 1 tablet by mouth at bedtime.      rosuvastatin (CRESTOR) 10 MG tablet Take 1 tablet (10 mg total) by mouth daily. 90 tablet 1   sacubitril-valsartan (ENTRESTO) 24-26 MG Take 1 tablet by mouth 2 (two) times daily. 60 tablet 11   sertraline (ZOLOFT) 100 MG tablet Take 1 tablet (100 mg total) by mouth daily. 90 tablet 3   spironolactone (ALDACTONE) 25 MG tablet Take 1 tablet (25 mg total) by mouth at bedtime. 90 tablet 3   TRULICITY 3 EF/0.0FH SOPN INJECT 3 MG INTO THE SKIN  WEEKLY 6 mL 1   vitamin B-12 (CYANOCOBALAMIN) 100 MCG tablet Take 100 mcg by mouth daily.     Vitamin E 180 MG CAPS Take 180 mg by mouth at bedtime.      carvedilol (COREG) 6.25 MG tablet Take 1 tablet (6.25 mg total) by mouth 2 (two) times daily. 180 tablet 3   No current facility-administered medications for this encounter.   BP 110/68    Pulse 81    Wt 60.1 kg (132 lb 6.4 oz)    SpO2 97%    BMI 24.22 kg/m  General: NAD Neck: No JVD, no thyromegaly or thyroid nodule.  Lungs: Clear to auscultation bilaterally with normal respiratory effort. CV: Nondisplaced PMI.  Heart regular S1/S2, no S3/S4, no murmur.  No peripheral edema.  No carotid bruit.  Normal pedal pulses.  Abdomen: Soft, nontender, no hepatosplenomegaly, no distention.  Skin: Intact without lesions  or rashes.  Neurologic: Alert and oriented x 3.  Psych: Normal affect. Extremities: No clubbing or cyanosis.  HEENT: Normal.   Assessment/Plan: 1. Chronic systolic CHF:  Nonischemic cardiomyopathy.  Echo in 1/21 showed EF down to 30-35% with septal-lateral dyssynchrony.  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  Based on the timeline, the most likely cause for fall in EF seems to be Herceptin use.  She is now off Herceptin, but echo in 3/21 showed EF still in the 30-35% range.  LHC/RHC in 4/21 showed low filling pressures, preserved cardiac output, and no significant CAD.  Cardiac MRI in 5/21 showed EF 36%, no LGE (no evidence for infiltrative disease). Of note, brother died of sudden death so also consider familial cardiomyopathy.  Echo in 10/21 showed EF up a bit to 40-45% with dyssynchrony due to LBBB.  Echo in 4/22 showed EF 45%, septal-lateral dyssynchrony. NYHA class II symptoms.  She is not volume overloaded by exam.  She continues to have orthostatic-type dizziness.  She says that SBP at home is usually in the 90s.  - Check orthostatics.   - Decrease Entresto to 24/26 bid and Coreg to 6.25 mg bid with orthostatic symptoms.  - Continue spironolactone 25 mg daily, take qhs.   - Continue Jardiance.  - She will not be getting any more Herceptin.   - EF is now out of range for CRT-D.  2. 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  - Follow closely with use of Coreg.   3. LBBB: Chronic.  ?LBBB cardiomyopathy, though QRS today was not markedly prolonged (124 msec).   4. Breast cancer: She will not  get further Herceptin.    Followup HF pharmacist in 2-3 wks to reassess BP on lower Entresto and Coreg.  Followup with me in 3 months with echo.   Loralie Champagne 07/24/2021

## 2021-07-26 ENCOUNTER — Ambulatory Visit: Payer: HMO

## 2021-07-31 ENCOUNTER — Ambulatory Visit: Payer: HMO | Admitting: Physical Therapy

## 2021-07-31 ENCOUNTER — Encounter: Payer: Self-pay | Admitting: Internal Medicine

## 2021-07-31 DIAGNOSIS — Z794 Long term (current) use of insulin: Secondary | ICD-10-CM

## 2021-07-31 DIAGNOSIS — E118 Type 2 diabetes mellitus with unspecified complications: Secondary | ICD-10-CM

## 2021-08-01 ENCOUNTER — Encounter: Payer: HMO | Admitting: Physical Therapy

## 2021-08-01 MED ORDER — LANTUS SOLOSTAR 100 UNIT/ML ~~LOC~~ SOPN: PEN_INJECTOR | SUBCUTANEOUS | 3 refills | Status: DC

## 2021-08-01 NOTE — Addendum Note (Signed)
Addended by: Lauralyn Primes on: 08/01/2021 08:33 AM   Modules accepted: Orders

## 2021-08-07 ENCOUNTER — Encounter: Payer: HMO | Admitting: Physical Therapy

## 2021-08-14 NOTE — Progress Notes (Incomplete)
***In Progress***    Advanced Heart Failure Clinic Note   PCP: [name], FNP Oncology: Dr. Lindi Adie HF cardiology: Dr. Aundra Dubin   HPI:  71 y.o. with history of DM, chronic LBBB, and breast cancer was referred by Dr. Lindi Adie for workup of fall in EF.  She was diagnosed with right breast cancer, ER+/PR+/HER2+ in 02/2019.  She had right lumpectomy in 03/2019.  Starting in 04/2019, she had been getting 12 cycles of Taxol/Herceptin to be followed by Herceptin alone to complete a year. Echo in 03/2019 showed EF 55-60% per report (looked more like 50% on my review, likely due to septal-lateral dyssynchrony).  Her repeat echo for Herceptin screening was done in 06/2019, showing EF down to 30-35% with septal-lateral dyssynchrony.  Herceptin has been stopped for now.  She is currently undergoing radiation therapy.    Echo in 08/2019 showed that EF remains 30-35% with prominent septal-lateral dyssynchrony.  Patient has a strong family history of heart problems.  Father died from "ruptured aorta."  Sister had heart valve repair, one brother has a pacemaker, and another brother has AL amyloidosis.    LHC/RHC was done in 09/2019. This showed normal filling pressures, preserved cardiac output, and no significant CAD. Cardiac MRI in 10/2019 showed EF 36% with septal-lateral dyssynchrony, RV EF normal 45%, no LGE.  Echo in 03/2020 showed EF 40-45% with septal-lateral dyssynchrony.    Echo in 09/2020 showed EF 45% with septal-lateral dyssynchrony, normal RV.    She returned to clinic 07/24/21 for follow up of cardiomyopathy.  Weight was down 3 lbs.  She continued to have dizziness/lightheadedness.  She had been seen in the ER for this 06/30/21 and thought to have vertigo.  However, meclizine did not help. Reported that she got dizzy when she stood up from sitting.  She felt generally fatigued but denied exertional dyspnea.  She had occasional mild atypical chest pain.   Today she returns to HF clinic for  pharmacist medication titration. At last visit with MD, Delene Loll and carvedilol doses were both decreased due to orthostatic symptoms and home systolic BP consistently in the 90s. ***  Overall feeling ***. Dizziness, lightheadedness, fatigue:  Chest pain or palpitations:  How is your breathing?: *** SOB: Able to complete all ADLs. Activity level ***  Weight at home pounds. Takes furosemide/torsemide/bumex *** mg *** daily.  LEE PND/Orthopnea  Appetite *** Low-salt diet:   Physical Exam Cost/affordability of meds   HF Medications: Carvedilol 6.25 mg BID Entresto 24-26 mg BID Spironolactone 25 mg daily (QHS) Jardiance 25 mg daily  Has the patient been experiencing any side effects to the medications prescribed?  {YES NO:22349}  Does the patient have any problems obtaining medications due to transportation or finances?   Has Medicare - [employer]. ***  Understanding of regimen: {excellent/good/fair/poor:19665} Understanding of indications: {excellent/good/fair/poor:19665} Potential of compliance: {excellent/good/fair/poor:19665} Patient understands to avoid NSAIDs. Patient understands to avoid decongestants.    Pertinent Lab Values: 07/07/21: Serum creatinine 0.86, BUN 19, Potassium 4.3, Sodium 145,   Vital Signs: Weight: *** (last clinic weight: 132.4 lb) Blood pressure: ***  Heart rate: ***   Assessment/Plan: 1. Chronic systolic CHF:  Nonischemic cardiomyopathy.  Echo in 06/2019 showed EF down to 30-35% with septal-lateral dyssynchrony.  Reviewing the prior echo in 03/2019, I think that there was a mild pre-existing cardiomyopathy (possibly LBBB cardiomyopathy), EF looked like 50% to me (not normal).  Based on the timeline, the most likely cause for fall in EF seems to be Herceptin use.  She is now off Herceptin, but echo in 08/2019 showed EF still in the 30-35% range.  LHC/RHC in 09/2019 showed low filling pressures, preserved cardiac output, and no significant  CAD.  Cardiac MRI in 10/2019 showed EF 36%, no LGE (no evidence for infiltrative disease). Of note, brother died of sudden death so also consider familial cardiomyopathy.  Echo in 03/2020 showed EF up a bit to 40-45% with dyssynchrony due to LBBB.  Echo in 09/2020 showed EF 45%, septal-lateral dyssynchrony. NYHA class II symptoms.  She is not volume overloaded by exam.  She continues to have orthostatic-type dizziness.  She says that SBP at home is usually in the 90s.  - Check orthostatics.   - Decrease Entresto to 24/26 BID and carvedilol to 6.25 mg BID with orthostatic symptoms.  - Continue spironolactone 25 mg daily, take qhs.   - Continue Jardiance.  - She will not be getting any more Herceptin.   - EF is now out of range for CRT-D.  2. 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  - Follow closely with use of Coreg.   3. LBBB: Chronic.  ?LBBB cardiomyopathy, though QRS today was not markedly prolonged (124 msec).   4. Breast cancer: She will not get further Herceptin.    Follow up ***   Audry Riles, PharmD, BCPS, BCCP, CPP Heart Failure Clinic Pharmacist [phone]

## 2021-08-16 ENCOUNTER — Ambulatory Visit (HOSPITAL_COMMUNITY)
Admission: RE | Admit: 2021-08-16 | Discharge: 2021-08-16 | Disposition: A | Discharge status: Home / Self Care | Payer: HMO | Source: Ambulatory Visit | Attending: Cardiology | Admitting: Cardiology

## 2021-08-16 ENCOUNTER — Other Ambulatory Visit: Payer: Self-pay

## 2021-08-16 VITALS — BP 128/82 | HR 84 | Wt 134.4 lb

## 2021-08-16 DIAGNOSIS — I447 Left bundle-branch block, unspecified: Secondary | ICD-10-CM | POA: Diagnosis not present

## 2021-08-16 DIAGNOSIS — C50919 Malignant neoplasm of unspecified site of unspecified female breast: Secondary | ICD-10-CM | POA: Diagnosis not present

## 2021-08-16 DIAGNOSIS — I428 Other cardiomyopathies: Secondary | ICD-10-CM | POA: Diagnosis not present

## 2021-08-16 DIAGNOSIS — I5022 Chronic systolic (congestive) heart failure: Secondary | ICD-10-CM

## 2021-08-16 DIAGNOSIS — E119 Type 2 diabetes mellitus without complications: Secondary | ICD-10-CM | POA: Diagnosis not present

## 2021-08-16 NOTE — Patient Instructions (Signed)
It was a pleasure seeing you today!  MEDICATIONS: -No medication changes today -Call if you have questions about your medications.  NEXT APPOINTMENT: Return to clinic on 5/1 with Dr. Aundra Dubin.  In general, to take care of your heart failure: -Limit your fluid intake to 2 Liters (half-gallon) per day.   -Limit your salt intake to ideally 2-3 grams (2000-3000 mg) per day. -Weigh yourself daily and record, and bring that "weight diary" to your next appointment.  (Weight gain of 2-3 pounds in 1 day typically means fluid weight.) -The medications for your heart are to help your heart and help you live longer.   -Please contact us before stopping any of your heart medications.  Call the clinic at [phone] with questions or to reschedule future appointments.

## 2021-08-16 NOTE — Progress Notes (Signed)
Advanced Heart Failure Clinic Note   PCP: [name], FNP Oncology: Dr. Lindi Adie HF cardiology: Dr. Aundra Dubin   HPI:  71 y.o. with history of DM, chronic LBBB, and breast cancer was referred by Dr. Lindi Adie for workup of fall in EF.  She was diagnosed with right breast cancer, ER+/PR+/HER2+ in 02/2019.  She had right lumpectomy in 03/2019.  Starting in 04/2019, she had been getting 12 cycles of Taxol/Herceptin to be followed by Herceptin alone to complete a year. Echo in 03/2019 showed EF 55-60% per report (looked more like 50% on my review, likely due to septal-lateral dyssynchrony).  Her repeat echo for Herceptin screening was done in 06/2019, showing EF down to 30-35% with septal-lateral dyssynchrony.  Herceptin has been stopped for now.  She is currently undergoing radiation therapy.    Echo in 08/2019 showed that EF remains 30-35% with prominent septal-lateral dyssynchrony.  Patient has a strong family history of heart problems.  Father died from "ruptured aorta."  Sister had heart valve repair, one brother has a pacemaker, and another brother has AL amyloidosis.    LHC/RHC was done in 09/2019. This showed normal filling pressures, preserved cardiac output, and no significant CAD. Cardiac MRI in 10/2019 showed EF 36% with septal-lateral dyssynchrony, RV EF normal 45%, no LGE.  Echo in 03/2020 showed EF 40-45% with septal-lateral dyssynchrony.    Echo in 09/2020 showed EF 45% with septal-lateral dyssynchrony, normal RV.    She returned to clinic 07/24/21 for follow up of cardiomyopathy.  Weight was down 3 lbs.  She continued to have dizziness/lightheadedness.  She had been seen in the ER for this 06/30/21 and thought to have vertigo.  However, meclizine did not help. Reported that she got dizzy when she stood up from sitting.  She felt generally fatigued but denied exertional dyspnea.  She had occasional mild atypical chest pain.   Today she returns to HF clinic for pharmacist medication  titration. At last visit with MD, Delene Loll and carvedilol doses were both decreased due to orthostatic symptoms and home systolic BP consistently in the 90s. Overall feeling better. Reports improvement in frequency of dizziness since dose decreases made at last visit. She did have a "bad spell" on Sunday where she couldn't get out of bed due to dizziness and vomited twice. SBP that afternoon was 121. Reports she has a history of vertigo and vomiting episodes like this have only happened a couple of times before. Otherwise, her dizziness has improved. Has mild dizziness 2-3 times/week that resolves after a couple minutes and improves with PRN meclizine. Home BP mostly 110s/60-70s. Only 1 systolic reading less than 100. Fatigue has been stable. No chest pain. Reports some occasional heart fluttering that lasts a few seconds. Able to complete all ADLs. Has to stop after 30 minutes of yard work due to fatigue. Gets SOB with 1 flight of stairs. Weight at home is stable, 127-130 lbs. No loop diuretic use. No LEE, PND, or orthopnea. Reports having a larger appetite lately but still avoiding salt.   Orthostatic BP:  Sitting: 128/82 Standing: 118/82  HF Medications: Carvedilol 6.25 mg BID Entresto 24-26 mg BID Spironolactone 25 mg daily  Jardiance 25 mg daily  Has the patient been experiencing any side effects to the medications prescribed?  No  Does the patient have any problems obtaining medications due to transportation or finances?   Has Medicare - [employer]. Patient reports copays have been affordable.   Understanding of regimen: good Understanding of  indications: good Potential of compliance: good Patient understands to avoid NSAIDs. Patient understands to avoid decongestants.   Pertinent Lab Values: 07/07/21: Serum creatinine 0.86, BUN 19, Potassium 4.3, Sodium 145,   Vital Signs: Weight: 134.4 lbs (last clinic weight: 132.4 lbs) Blood pressure: 128/82  Heart rate: 84    Assessment/Plan: 1. Chronic systolic CHF:  Nonischemic cardiomyopathy.  Echo in 06/2019 showed EF down to 30-35% with septal-lateral dyssynchrony.  Prior echo in 03/2019 showed mild pre-existing cardiomyopathy (possibly LBBB cardiomyopathy), EF looked like 50% (not normal).  Based on the timeline, the most likely cause for fall in EF seems to be Herceptin use.  She is now off Herceptin, but echo in 08/2019 showed EF still in the 30-35% range.  LHC/RHC in 09/2019 showed low filling pressures, preserved cardiac output, and no significant CAD.  Cardiac MRI in 10/2019 showed EF 36%, no LGE (no evidence for infiltrative disease). Of note, brother died of sudden death so also consider familial cardiomyopathy.  Echo in 03/2020 showed EF up a bit to 40-45% with dyssynchrony due to LBBB.  Echo in 09/2020 showed EF 45%, septal-lateral dyssynchrony.  - NYHA class II symptoms.  Euvolemic on exam.  Dizziness and hypotension has improved with recent dose decreases.  - Continue carvedilol 6.25 mg BID - Continue Entresto 24/26 mg BID - Continue spironolactone 25 mg daily  - Continue Jardiance 25 mg daily  - She will not be getting any more Herceptin.   - EF is now out of range for CRT-D.  2. 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  - Follow closely with use of carvedilol.   3. LBBB: Chronic.  ?LBBB cardiomyopathy, though QRS 07/24/21 was not markedly prolonged (124 msec).   4. Breast cancer: She will not get further Herceptin.    Follow up in May with Dr. Aundra Dubin.    Audry Riles, PharmD, BCPS, BCCP, CPP Heart Failure Clinic Pharmacist [phone]

## 2021-09-05 ENCOUNTER — Encounter (HOSPITAL_COMMUNITY): Payer: HMO | Admitting: Cardiology

## 2021-09-15 ENCOUNTER — Other Ambulatory Visit: Payer: Self-pay

## 2021-09-15 ENCOUNTER — Ambulatory Visit (INDEPENDENT_AMBULATORY_CARE_PROVIDER_SITE_OTHER): Payer: HMO | Admitting: Internal Medicine

## 2021-09-15 ENCOUNTER — Encounter: Payer: Self-pay | Admitting: Internal Medicine

## 2021-09-15 VITALS — BP 100/60 | HR 90 | Ht 62.0 in | Wt 136.8 lb

## 2021-09-15 DIAGNOSIS — E1165 Type 2 diabetes mellitus with hyperglycemia: Secondary | ICD-10-CM | POA: Diagnosis not present

## 2021-09-15 DIAGNOSIS — E118 Type 2 diabetes mellitus with unspecified complications: Secondary | ICD-10-CM

## 2021-09-15 DIAGNOSIS — E042 Nontoxic multinodular goiter: Secondary | ICD-10-CM | POA: Diagnosis not present

## 2021-09-15 DIAGNOSIS — E785 Hyperlipidemia, unspecified: Secondary | ICD-10-CM | POA: Diagnosis not present

## 2021-09-15 DIAGNOSIS — Z794 Long term (current) use of insulin: Secondary | ICD-10-CM | POA: Diagnosis not present

## 2021-09-15 LAB — POCT GLYCOSYLATED HEMOGLOBIN (HGB A1C): Hemoglobin A1C: 6.7 % — AB (ref 4.0–5.6)

## 2021-09-15 MED ORDER — LANTUS SOLOSTAR 100 UNIT/ML ~~LOC~~ SOPN: PEN_INJECTOR | SUBCUTANEOUS | 3 refills | Status: DC

## 2021-09-15 NOTE — Patient Instructions (Addendum)
Please continue: ?- Metformin 2000 mg with dinner ?- Jardiance 25 mg daily before breakfast ?- Trulicity 3 mg weekly ? ?Please decrease:  ?- Lantus 26 units at night (if the sugars at night are still low after this, decrease further 22-24 units daily) ? ?Please return in 4 months.  ?

## 2021-09-15 NOTE — Progress Notes (Signed)
Patient ID: Margaret Norman, female   DOB: 1951/02/05, 71 y.o.   MRN: [id number]  ? ?This visit occurred during the SARS-CoV-2 public health emergency.  Safety protocols were in place, including screening questions prior to the visit, additional usage of staff PPE, and extensive cleaning of exam room while observing appropriate contact time as indicated for disinfecting solutions.  ? ?HPI: ?Margaret Norman is a 71 y.o.-year-old female, returning for follow-up for DM2, dx in 2008, insulin-dependent since 2016, uncontrolled, without long term complications.  Last visit  4 months ago. ? ?Interim history:  ?No increased urination, blurry vision, chest pain.  She had lightheadedness and dizziness at last visit.  She was in the emergency room with vertigo 06/30/2021. ?She previously had intermittent abdominal pain after eating larger meals.  EGD was not revealing.  No abdominal pain now. ?Before last visit, she moved dinners earlier.  At last visit I advised her to not eat beyond 7 PM. ?Since last visit she was able to obtain a freestyle libre CGM.  Sugars are better afterwards. ? ?Reviewed HbA1c levels: ?Lab Results  ?Component Value Date  ? HGBA1C 7.1 (A) 05/17/2021  ? HGBA1C 7.0 (A) 01/10/2021  ? HGBA1C 6.9 (A) 09/20/2020  ?03/19/2017: HbA1c calculated from fructosamine is 6.7%! ?12/31/2016: HbA1c calculated from fructosamine: 6.9%! ? ?Pt is on: ?- Metformin 1000 mg 2x a day with meals >> 2000 mg with dinner ?- Jardiance 25 mg daily before breakfast (PAP) ?- Trulicity 1.5 >> 3 mg weekly (PAP) ?- Lantus 30 units at night ?Stopped Actos when started trulicity back. ?She was on Trulicity but became expensive >> changed to Actos. ?She was on Invokana >> recurrent UTIs. ? ?Pt checks her sugars 4x a day: ? ? ?Prev.: ?- am:  78, 88-136, 142 (forgot medicine) >> 83-132, 157 >> 78-146, 163 ?- 2h after b'fast: 161-190 >> 189-210 >> n/c >> 94-132 >> n/c ?- before lunch: 199 >> n/c >> 135-215 >> n/c>> 80-145 ?- 2h after lunch: 161  >> 176 >> 96 >> 198 >> n/c >> 177 >> n/c ?- before dinner: 240 (?) >> 119, 167 >> 108 >> n/c >> 78 ?- 2h after dinner: 292 >> n/c >> 156 >> n/c >> 112 >> n/c ?- bedtime: 172, 200 >> 138, 217, 227 >> 129 >> 141 >> 74, 152-199 ?- nighttime: n/c >> 172 >> 178 >> n/c ?Lowest sugar was 60s x1 >> 83>> 74 >> 66; she has hypoglycemia awareness in the 70s. ?Highest sugar was 227 >> <200 >> 177 >> 199 >> 200s. ? ?Glucometer: OneTouch Verio ? ?-No CKD, last BUN/creatinine:  ?Lab Results  ?Component Value Date  ? BUN 19 07/07/2021  ? BUN 16 06/30/2021  ? CREATININE 0.86 07/07/2021  ? CREATININE 0.71 06/30/2021  ?On Entresto. ? ?-+ HL; last set of lipids: ?Lab Results  ?Component Value Date  ? CHOL 109 07/24/2021  ? HDL 46 07/24/2021  ? [place] 47 07/24/2021  ? LDLDIRECT 137.9 02/04/2013  ? TRIG 78 07/24/2021  ? CHOLHDL 2.4 07/24/2021  ?On Crestor 10. ? ?- last eye exam was in 07/2020: No DR, + cataract OS.  Dr. Marica Otter. Scheduled in 10/2021. ? ?-+ numbness and tingling in her toes.  She has restless leg syndrome. ? ?Pt has FH of DM in MGM. ? ?Multinodular goiter:   ?-Detected on palpation in 2021 ? ?Thyroid ultrasound (01/26/2020): ?Parenchymal Echotexture: Moderately heterogenous ?Isthmus: 0.3 cm ?Right lobe: 6.2 cm x 2.1 cm x 2.2 cm ?Left lobe: 7.7 cm  x 3.2 cm x 2.8 cm ?_________________________________________________________ ?  ?Nodule # 1: ?Location: Right; Superior ?Maximum size: 0.9 cm; Other 2 dimensions: 0.7 cm x 0.7 cm ?Composition: spongiform (0) ?Spongiform nodule does not meet criteria for surveillance or biopsy ? _________________________________________________________ ?  ?Nodule # 2: ?Location: Right; Mid ?Maximum size: 1.0 cm; Other 2 dimensions: 0.9 cm x 0.6 cm ?Composition: spongiform (0) ?Spongiform nodule does not meet criteria for surveillance or biopsy ? _________________________________________________________ ?  ?Nodule # 3:  ?Location: Right; Mid ?Maximum size: 1.2 cm; Other 2 dimensions: 1.1 cm x  0.7 cm ?Composition: spongiform (0) ?Spongiform nodule does not meet criteria for surveillance or biopsy ?_________________________________________________________ ?  ?Nodule # 4:  ?Location: Right; Inferior ?Maximum size: 1.0 cm; Other 2 dimensions: 0.8 cm x 0.8 cm ?Composition: spongiform (0) ?Spongiform nodule does not meet criteria for surveillance or biopsy ? ________________________________________________________ ?  ?Nodule # 5:  ?Location: Right; Inferior ?Maximum size: 1.5 cm; Other 2 dimensions: 1.4 cm x 0.8 cm ?Composition: solid/almost completely solid (2) ?Echogenicity: isoechoic (1) ?Nodule meets criteria for surveillance ? _________________________________________________________ ?  ?Nodule # 6: ?Location: Left; Superior ?Maximum size: 1.2 cm; Other 2 dimensions: 1.1 cm x 0.9 cm  ?Composition: cannot determine (2) ?Echogenicity: isoechoic (1) ?Echogenic foci: macrocalcifications (1)  ?Nodule meets criteria for surveillance ? _______________________________________________________ ?  ?Nodule # 7: ?Location: Left; Inferior ?Maximum size: 4.7 cm; Other 2 dimensions: 4.7 cm x 2.8 cm ?Composition: mixed cystic and solid (1) ?Echogenicity: isoechoic (1) ?Echogenic foci: macrocalcifications (1) ?Nodule meets criteria for biopsy ? ______________________________________________________ ?  ?No adenopathy ?  ?IMPRESSION: ?Multinodular thyroid. ?  ?Left inferior thyroid nodule (labeled 7, TR , 4.7 cm) meets criteria ?for biopsy, as designated by the newly established ACR TI-RADS ?criteria, and referral for biopsy is recommended. ?  ?Right inferior thyroid nodule (labeled 5, 1.5 cm, TR 3) and the left ?superior thyroid nodule (labeled 6, 1.2 cm, TR 4) both meet criteria ?for surveillance, as designated by the newly established ACR TI-RADS ?criteria. Surveillance ultrasound study recommended to be performed ?annually up to 5 years. ?  ?FNA left inferior thyroid nodule (02/16/2020): Benign ? ?Thyroid U/S  (02/07/2021): ?Parenchymal Echotexture: Markedly heterogenous ?Isthmus: 0.6 cm ?Right lobe: 7.4 cm x 2.0 cm x 2.5 cm ?Left lobe: 8.4 cm x 3.2 cm x 4.3 cm ?_________________________________________________________ ?  ?Estimated total number of nodules >/= 1 cm: 6-10  ?_________________________________________________________ ?  ?Nodule labeled 1 superior right thyroid, slightly smaller than ?previous and spongiform. Nodule does not meet criteria for ?surveillance or biopsy ?  ?Nodule labeled 2, right thyroid, TR 2 with cystic components ?measures less than 1 cm. Nodule does not meet criteria for ?surveillance or biopsy ?  ?Nodule labeled 3, mid right thyroid. Spongiform characteristics ?measuring 1.3 cm. Nodule does not meet criteria for surveillance or ?biopsy. ?  ?Nodule labeled 4, mid right thyroid, 9 mm, spongiform ?characteristics. Nodule does not meet criteria for surveillance or ?biopsy. ?  ?Nodule labeled 5, 0.98 cm, spongiform characteristics and does not ?meet criteria for surveillance or biopsy. ?  ?Nodule 6, right lower thyroid, 0.96 cm. Nodule has spongiform ?characteristics and does not meet criteria for surveillance or ?biopsy. ?  ?Nodule 7, right lower thyroid, smaller than previous, now 1.0 cm. ?Previous nodule was labeled 5, 1.5 cm. Nodule has clearly spongiform ?characteristics on the current ultrasound, and has decreased in size ?below 1 cm. Nodule no longer meets criteria for surveillance. ?  ?Nodule labeled 8 on the left, upper left thyroid, 9 mm. Nodule  has ?internal cystic change on the current, TR 2 characteristics, and has ?decreased in size below 1 cm. Nodule no longer meets criteria for ?continued surveillance. ?  ?The nodules at the left inferior thyroid labeled 9, 10, 11, appear ?to all be part of the previous 4.7 cm nodule which was previously ?biopsied 02/16/2020, and are doubtful to represent independent ?nodules given the available images and the comparison. ?  ?No adenopathy ?   ?Recommendations follow those established by the new ACR TI-RADS ?criteria ([place] [id number]). ?  ?IMPRESSION: ?Enlarged multinodular thyroid, as above. ?  ?Nodule inferior left thyroid has been previ

## 2021-09-27 ENCOUNTER — Other Ambulatory Visit (HOSPITAL_COMMUNITY): Payer: Self-pay | Admitting: Cardiology

## 2021-10-17 ENCOUNTER — Other Ambulatory Visit: Payer: Self-pay | Admitting: Internal Medicine

## 2021-10-20 ENCOUNTER — Encounter: Payer: Self-pay | Admitting: Hematology and Oncology

## 2021-10-23 ENCOUNTER — Encounter (HOSPITAL_COMMUNITY): Payer: Self-pay | Admitting: Cardiology

## 2021-10-23 ENCOUNTER — Ambulatory Visit (HOSPITAL_BASED_OUTPATIENT_CLINIC_OR_DEPARTMENT_OTHER)
Admission: RE | Admit: 2021-10-23 | Discharge: 2021-10-23 | Disposition: A | Discharge status: Home / Self Care | Payer: HMO | Source: Ambulatory Visit | Attending: Cardiology | Admitting: Cardiology

## 2021-10-23 ENCOUNTER — Ambulatory Visit (HOSPITAL_COMMUNITY)
Admission: RE | Admit: 2021-10-23 | Discharge: 2021-10-23 | Disposition: A | Discharge status: Home / Self Care | Payer: HMO | Source: Ambulatory Visit | Attending: Internal Medicine | Admitting: Internal Medicine

## 2021-10-23 VITALS — BP 130/80 | HR 76 | Wt 137.6 lb

## 2021-10-23 DIAGNOSIS — C50911 Malignant neoplasm of unspecified site of right female breast: Secondary | ICD-10-CM | POA: Diagnosis not present

## 2021-10-23 DIAGNOSIS — Z9221 Personal history of antineoplastic chemotherapy: Secondary | ICD-10-CM | POA: Diagnosis not present

## 2021-10-23 DIAGNOSIS — Z7984 Long term (current) use of oral hypoglycemic drugs: Secondary | ICD-10-CM | POA: Diagnosis not present

## 2021-10-23 DIAGNOSIS — I110 Hypertensive heart disease with heart failure: Secondary | ICD-10-CM | POA: Insufficient documentation

## 2021-10-23 DIAGNOSIS — I447 Left bundle-branch block, unspecified: Secondary | ICD-10-CM | POA: Diagnosis not present

## 2021-10-23 DIAGNOSIS — Z170 Estrogen receptor positive status [ER+]: Secondary | ICD-10-CM | POA: Insufficient documentation

## 2021-10-23 DIAGNOSIS — I428 Other cardiomyopathies: Secondary | ICD-10-CM | POA: Diagnosis not present

## 2021-10-23 DIAGNOSIS — I5022 Chronic systolic (congestive) heart failure: Secondary | ICD-10-CM

## 2021-10-23 DIAGNOSIS — Z79899 Other long term (current) drug therapy: Secondary | ICD-10-CM | POA: Insufficient documentation

## 2021-10-23 DIAGNOSIS — E119 Type 2 diabetes mellitus without complications: Secondary | ICD-10-CM | POA: Insufficient documentation

## 2021-10-23 LAB — ECHOCARDIOGRAM COMPLETE
Calc EF: 46.5 %
S' Lateral: 3.4 cm
Single Plane A2C EF: 49.7 %
Single Plane A4C EF: 43.2 %

## 2021-10-23 LAB — BASIC METABOLIC PANEL
Anion gap: 7 (ref 5–15)
BUN: 15 mg/dL (ref 8–23)
CO2: 24 mmol/L (ref 22–32)
Calcium: 9.6 mg/dL (ref 8.9–10.3)
Chloride: 110 mmol/L (ref 98–111)
Creatinine, Ser: 0.66 mg/dL (ref 0.44–1.00)
GFR, Estimated: >60 mL/min (ref >60)
Glucose, Bld: 78 mg/dL (ref 70–99)
Potassium: 4.2 mmol/L (ref 3.5–5.1)
Sodium: 141 mmol/L (ref 135–145)

## 2021-10-23 NOTE — Progress Notes (Signed)
PCP: [name], [place] ?Oncology: Dr. Lindi Adie ?HF cardiology: Dr. Aundra Dubin  ? ?71 y.o. with history of DM, chronic LBBB, and breast cancer was referred by Dr. Lindi Adie for workup of fall in EF.  She was diagnosed with right breast cancer, ER+/PR+/HER2+ in 9/20.  She had right lumpectomy in 10/20.  Starting in 11/20, she had been getting 12 cycles of Taxol/Herceptin to be followed by Herceptin alone to complete a year. Echo in 10/20 showed EF 55-60% per report (looked more like 50% on my review, likely due to septal-lateral dyssynchrony).  Her repeat echo for Herceptin screening was done in 1/21, showing EF down to 30-35% with septal-lateral dyssynchrony.  Herceptin has been stopped for now.  ?She is currently undergoing radiation therapy.  ? ?Echo in 3/21 showed that EF remains 30-35% with prominent septal-lateral dyssynchrony.  Patient has a strong family history of heart problems.  Father died from "ruptured aorta."  Sister had heart valve repair, one brother has a pacemaker, and another brother has AL amyloidosis.  ? ?LHC/RHC was done in 4/21. This showed normal filling pressures, preserved cardiac output, and no significant CAD. Cardiac MRI in 5/21 showed EF 36% with septal-lateral dyssynchrony, RV EF normal 45%, no LGE.  Echo in 10/21 showed EF 40-45% with septal-lateral dyssynchrony.  ? ?Echo in 4/22 showed EF 45% with septal-lateral dyssynchrony, normal RV.  Echo was done today and reviewed, EF 45% with septal-lateral dyssynchrony, normal RV, IVC normal.  ? ?She returns today for followup of cardiomyopathy.  Weight is up 5 lbs.  She has few and more mild dizzy spells after Coreg and Entresto were decreased.  No exertional dyspnea, walking for exercise.  No orthopnea/PND.  No chest pain. No palpitations.  ? ?Labs (1/21): K 3.8, creatinine 0.75 ?Labs (3/21): K 4.5, creatinine 0.71 ?Labs (4/21): K 4.3, creatinine 0.82 => 0.83 ?Labs (8/21): hgb 12.7, K 4.7, creatinine 0.85 ?Labs (10/21): K 4.2, creatinine  0.92 ?Labs (1/22): LDL 37, HDL 47, K 4.3, creatinine 0.77 ?Labs (1/23): K 4.3, creatinine 0.86, hgb 12, LDL 47 ? ?ECG (personally reviewed): NSR, LBBB 122 msec ? ?PMH: ?1. Depression ?2. Type 2 diabetes ?3. Chronic LBBB ?4. H/o transient complete heart block: in 10/20 when she was having port-a-cath placed.  ?5. HTN ?6. Hyperlipidemia.  ?7. Breast cancer: She was diagnosed with right breast cancer, ER+/PR+/HER2+ in 9/20.  She had right lumpectomy in 10/20.  Starting in 11/20, she has been getting 12 cycles of Taxol/Herceptin to be followed by Herceptin alone to complete a year.  ?8. Chronic systolic CHF: She has had mildly decreased EF in the past, possibly related to LBBB.  Nonischemic cardiomyopathy.  ?- Echo (4/17): EF 50-55% ?- Cardiolite (4/17): EF 48% ?- Echo (10/20): EF 55-60% (reviewed, think more like 50% but technically difficult).  ?- Echo (1/21): EF 30-35%, mild LVH, septal dyssynchrony.  ?- Echo (3/21): EF 30-35% with prominent septal-lateral dyssynchrony.  ?- LHC/RHC (4/21): mean RA 1, PA 13/3, mean PCWP 2, CI 3.74, no significant CAD.  ?- Cardiac MRI (5/21): EF 36% with septal-lateral dyssynchrony, RV EF normal 45%, no LGE.  ?- Echo (10/21): EF 40-45%, septal-lateral dyssynchrony.  ?- Echo (4/22): EF 45% with septal-lateral dyssynchrony, normal RV. ?- Echo (4/23): EF 45% with septal-lateral dyssynchrony, normal RV, IVC normal.  ? ?Social History  ? ?Socioeconomic History  ? Marital status: Married  ?  Spouse name: Nelida Mandarino  ? Number of children: 2  ? Years of education: Not on file  ? Highest  education level: Not on file  ?Occupational History  ? Occupation: CSR  ?  Employer: [employer]  ? Occupation: retired  ?Tobacco Use  ? Smoking status: Never  ? Smokeless tobacco: Never  ?Vaping Use  ? Vaping Use: Never used  ?Substance and Sexual Activity  ? Alcohol use: No  ? Drug use: No  ? Sexual activity: Not Currently  ?Other Topics Concern  ? Not on file  ?Social History Narrative  ? Regular  Exercise -  NO  ? ?Social Determinants of Health  ? ?Financial Resource Strain: Not on file  ?Food Insecurity: Not on file  ?Transportation Needs: Not on file  ?Physical Activity: Not on file  ?Stress: Not on file  ?Social Connections: Not on file  ?Intimate Partner Violence: Not on file  ? ?Family History  ?Problem Relation Age of Onset  ? Liver disease Mother   ? Dementia Mother   ? Diabetes Mother   ?     borderline  ? Coronary artery disease Father   ? Heart attack Father   ? Hypertension Father   ? Heart disease Father   ? Cancer Other   ?     leukemia  ? Stroke Maternal Grandfather   ? Hyperlipidemia Brother   ?     Amyloidosis  ? Diabetes Maternal Grandmother   ? Cancer Paternal Uncle   ?     unknown  ? Heart attack Paternal Grandmother   ? Heart attack Paternal Uncle   ? Hypertension Brother   ? Diabetes Daughter   ?     borderline  ? Colon cancer Neg Hx   ? Esophageal cancer Neg Hx   ? Rectal cancer Neg Hx   ? Stomach cancer Neg Hx   ? ?Current Outpatient Medications  ?Medication Sig Dispense Refill  ? acetaminophen (TYLENOL) 500 MG tablet Take 1,000 mg by mouth every 6 (six) hours as needed for moderate pain.    ? anastrozole (ARIMIDEX) 1 MG tablet Take 1 tablet (1 mg total) by mouth daily. 90 tablet 3  ? aspirin 81 MG EC tablet Take 81 mg by mouth daily.    ? Blood Glucose Monitoring Suppl (ONETOUCH VERIO FLEX SYSTEM) W/DEVICE KIT 1 kit by Does not apply route 3 (three) times daily. 1 kit 1  ? buPROPion (WELLBUTRIN XL) 150 MG 24 hr tablet TAKE 1 TABLET BY MOUTH DAILY IN THE MORNING 90 tablet 1  ? carvedilol (COREG) 6.25 MG tablet Take 1 tablet (6.25 mg total) by mouth 2 (two) times daily. 180 tablet 3  ? Cholecalciferol (VITAMIN D) 50 MCG (2000 UT) tablet Take 2,000 Units by mouth daily.    ? Continuous Blood Gluc Receiver (FREESTYLE LIBRE 2 READER) DEVI 1 each by Does not apply route daily. 1 each 0  ? fluticasone (FLONASE) 50 MCG/ACT nasal spray Place into both nostrils as needed for allergies or  rhinitis.    ? glucose blood (ONETOUCH VERIO) test strip Test three times daily as directed. DX: E11.9 300 each 3  ? Insulin Pen Needle 32G X 4 MM MISC 1 each by Other route daily. E11.9; THIS IS THE CORRECT PEN NEEDLE. PLEASE FILL RX AS ORDERED 100 each 3  ? JARDIANCE 25 MG TABS tablet TAKE 1 TABLET BY MOUTH DAILY 90 tablet 3  ? LANTUS SOLOSTAR 100 UNIT/ML Solostar Pen INJECT SUBCUTANEOUSLY 26  UNITS AT BEDTIME 30 mL 3  ? meclizine (ANTIVERT) 25 MG tablet Take 25 mg by mouth 3 (three) times daily  as needed for dizziness.    ? metFORMIN (GLUCOPHAGE) 1000 MG tablet TAKE 1 TABLET BY MOUTH TWICE DAILY WITH A MEAL 180 tablet 3  ? omeprazole (PRILOSEC) 40 MG capsule Take 40 mg by mouth daily.    ? OneTouch Delica Lancets 98K MISC 1 each by Does not apply route daily. E11.9 100 each 0  ? Probiotic Product (PROBIOTIC ADVANCED PO) Take 1 tablet by mouth at bedtime.     ? rosuvastatin (CRESTOR) 10 MG tablet Take 1 tablet (10 mg total) by mouth daily. 90 tablet 1  ? sacubitril-valsartan (ENTRESTO) 24-26 MG Take 1 tablet by mouth 2 (two) times daily. 60 tablet 11  ? sertraline (ZOLOFT) 100 MG tablet Take 1 tablet (100 mg total) by mouth daily. 90 tablet 3  ? spironolactone (ALDACTONE) 25 MG tablet TAKE 1 TABLET BY MOUTH DAILY 90 tablet 3  ? TRULICITY 3 IC/1.7VG SOPN INJECT 3 MG INTO THE SKIN  WEEKLY 6 mL 1  ? vitamin B-12 (CYANOCOBALAMIN) 100 MCG tablet Take 100 mcg by mouth daily.    ? Vitamin E 180 MG CAPS Take 180 mg by mouth at bedtime.     ? ?No current facility-administered medications for this encounter.  ? ?BP 130/80   Pulse 76   Wt 62.4 kg (137 lb 9.6 oz)   SpO2 97%   BMI 25.17 kg/m?  ?General: NAD ?Neck: No JVD, no thyromegaly or thyroid nodule.  ?Lungs: Clear to auscultation bilaterally with normal respiratory effort. ?CV: Nondisplaced PMI.  Heart regular S1/S2, no S3/S4, no murmur.  No peripheral edema.  No carotid bruit.  Normal pedal pulses.  ?Abdomen: Soft, nontender, no hepatosplenomegaly, no distention.   ?Skin: Intact without lesions or rashes.  ?Neurologic: Alert and oriented x 3.  ?Psych: Normal affect. ?Extremities: No clubbing or cyanosis.  ?HEENT: Normal.  ? ?Assessment/Plan: ?1. Chronic systolic CHF:  Nonisch

## 2021-10-23 NOTE — Patient Instructions (Signed)
There has been no changes to your medications. ? ?Labs done today, your results will be available in MyChart, we will contact you for abnormal readings. ? ?Your physician recommends that you schedule a follow-up appointment in: 6 months (November 2023)  ** please call the office in September to arrange your follow up appointment ** ? ?If you have any questions or concerns before your next appointment please send [place] a message through [facility] or call our office at [phone].   ? ?TO LEAVE A MESSAGE FOR THE NURSE SELECT OPTION 2, PLEASE LEAVE A MESSAGE INCLUDING: ?YOUR NAME ?DATE OF BIRTH ?CALL BACK NUMBER ?REASON FOR CALL**this is important as we prioritize the call backs ? ?YOU WILL RECEIVE A CALL BACK THE SAME DAY AS LONG AS YOU CALL BEFORE 4:00 PM ? ?At the [facility], you and your health needs are our priority. As part of our continuing mission to provide you with exceptional heart care, we have created designated Provider Care Teams. These Care Teams include your primary Cardiologist (physician) and Advanced Practice Providers (APPs- Physician Assistants and Nurse Practitioners) who all work together to provide you with the care you need, when you need it.  ? ?You may see any of the following providers on your designated Care Team at your next follow up: ?Dr Glori Bickers ?Dr Loralie Champagne ?Darrick Grinder, NP ?Lyda Jester, PA ?Jessica Milford,NP ?Marlyce Huge, PA ?Audry Riles, PharmD ? ? ?Please be sure to bring in all your medications bottles to every appointment.  ? ? ?

## 2021-10-26 DIAGNOSIS — H35033 Hypertensive retinopathy, bilateral: Secondary | ICD-10-CM | POA: Diagnosis not present

## 2021-10-26 DIAGNOSIS — H5203 Hypermetropia, bilateral: Secondary | ICD-10-CM | POA: Diagnosis not present

## 2021-10-26 DIAGNOSIS — H52223 Regular astigmatism, bilateral: Secondary | ICD-10-CM | POA: Diagnosis not present

## 2021-10-26 DIAGNOSIS — H524 Presbyopia: Secondary | ICD-10-CM | POA: Diagnosis not present

## 2021-10-26 DIAGNOSIS — E119 Type 2 diabetes mellitus without complications: Secondary | ICD-10-CM | POA: Diagnosis not present

## 2021-10-26 DIAGNOSIS — H04123 Dry eye syndrome of bilateral lacrimal glands: Secondary | ICD-10-CM | POA: Diagnosis not present

## 2021-10-26 DIAGNOSIS — H2513 Age-related nuclear cataract, bilateral: Secondary | ICD-10-CM | POA: Diagnosis not present

## 2021-10-26 DIAGNOSIS — I10 Essential (primary) hypertension: Secondary | ICD-10-CM | POA: Diagnosis not present

## 2021-10-26 LAB — HM DIABETES EYE EXAM

## 2021-11-10 ENCOUNTER — Encounter: Payer: Self-pay | Admitting: Hematology and Oncology

## 2021-11-15 ENCOUNTER — Encounter: Payer: Self-pay | Admitting: Internal Medicine

## 2021-12-13 ENCOUNTER — Encounter: Payer: Self-pay | Admitting: Hematology and Oncology

## 2021-12-13 ENCOUNTER — Ambulatory Visit (INDEPENDENT_AMBULATORY_CARE_PROVIDER_SITE_OTHER): Payer: HMO

## 2021-12-13 DIAGNOSIS — Z0000 Encounter for general adult medical examination without abnormal findings: Secondary | ICD-10-CM | POA: Diagnosis not present

## 2021-12-13 NOTE — Progress Notes (Signed)
Subjective:   Margaret Norman is a 71 y.o. female who presents for Medicare Annual (Subsequent) preventive examination.  I connected with  [name] on 12/13/21 by a audio enabled telemedicine application and verified that I am speaking with the correct person using two identifiers.  Patient Location: Home  Provider Location: Office/Clinic  I discussed the limitations of evaluation and management by telemedicine. The patient expressed understanding and agreed to proceed.   Review of Systems     Cardiac Risk Factors include: advanced age (>32mn, >>71women);diabetes mellitus;hypertension;dyslipidemia     Objective:    There were no vitals filed for this visit. There is no height or weight on file to calculate BMI.     12/13/2021    1:58 PM 06/30/2021    1:36 PM 04/25/2020   10:33 AM 04/14/2020    1:40 PM 03/16/2020   11:52 AM 01/28/2020    1:30 PM 09/28/2019    6:09 AM  Advanced Directives  Does Patient Have a Medical Advance Directive? _0  Yes No  Does patient want to make changes to medical advance directive?      No - Patient declined   Would patient like information on creating a medical advance directive? No - Patient declined No - Patient declined No - Patient declined    No - Patient declined    Current Medications (verified) Outpatient Encounter Medications as of 12/13/2021  Medication Sig   acetaminophen (TYLENOL) 500 MG tablet Take 1,000 mg by mouth every 6 (six) hours as needed for moderate pain.   anastrozole (ARIMIDEX) 1 MG tablet Take 1 tablet (1 mg total) by mouth daily.   aspirin 81 MG EC tablet Take 81 mg by mouth daily.   Blood Glucose Monitoring Suppl (ONETOUCH VERIO FLEX SYSTEM) W/DEVICE KIT 1 kit by Does not apply route 3 (three) times daily.   buPROPion (WELLBUTRIN XL) 150 MG 24 hr tablet TAKE 1 TABLET BY MOUTH DAILY IN THE MORNING   carvedilol (COREG) 6.25 MG tablet Take 1 tablet (6.25 mg total) by mouth 2 (two) times daily.    Cholecalciferol (VITAMIN D) 50 MCG (2000 UT) tablet Take 2,000 Units by mouth daily.   Continuous Blood Gluc Receiver (FREESTYLE LIBRE 2 READER) DEVI 1 each by Does not apply route daily.   fluticasone (FLONASE) 50 MCG/ACT nasal spray Place into both nostrils as needed for allergies or rhinitis.   glucose blood (ONETOUCH VERIO) test strip Test three times daily as directed. DX: E11.9   Insulin Pen Needle 32G X 4 MM MISC 1 each by Other route daily. E11.9; THIS IS THE CORRECT PEN NEEDLE. PLEASE FILL RX AS ORDERED   JARDIANCE 25 MG TABS tablet TAKE 1 TABLET BY MOUTH DAILY   LANTUS SOLOSTAR 100 UNIT/ML Solostar Pen INJECT SUBCUTANEOUSLY 26  UNITS AT BEDTIME   meclizine (ANTIVERT) 25 MG tablet Take 25 mg by mouth 3 (three) times daily as needed for dizziness.   metFORMIN (GLUCOPHAGE) 1000 MG tablet TAKE 1 TABLET BY MOUTH TWICE DAILY WITH A MEAL   omeprazole (PRILOSEC) 40 MG capsule Take 40 mg by mouth daily.   OneTouch Delica Lancets 340XMISC 1 each by Does not apply route daily. E11.9   Probiotic Product (PROBIOTIC ADVANCED PO) Take 1 tablet by mouth at bedtime.    rosuvastatin (CRESTOR) 10 MG tablet Take 1 tablet (10 mg total) by mouth daily.   sacubitril-valsartan (ENTRESTO) 24-26 MG Take 1 tablet by mouth 2 (two) times daily.  sertraline (ZOLOFT) 100 MG tablet Take 1 tablet (100 mg total) by mouth daily.   spironolactone (ALDACTONE) 25 MG tablet TAKE 1 TABLET BY MOUTH DAILY   TRULICITY 3 JJ/9.4RD SOPN INJECT 3 MG INTO THE SKIN  WEEKLY   vitamin B-12 (CYANOCOBALAMIN) 100 MCG tablet Take 100 mcg by mouth daily.   Vitamin E 180 MG CAPS Take 180 mg by mouth at bedtime.    No facility-administered encounter medications on file as of 12/13/2021.    Allergies (verified) Invokana [canagliflozin] and Amoxicillin   History: Past Medical History:  Diagnosis Date   Arthritis    Chest pain    Nuclear, adenosine,  December, 2013, low risk nuclear scan with small, moderate in intensity, fixed  anteroseptal defect. This is possibly related to an LBBB versus small prior infarct. 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 w/appy   CHOLECYSTECTOMY     ESOPHAGOGASTRODUODENOSCOPY     HERNIA REPAIR     KNEE ARTHROSCOPY Right 11/2005   LIVER BIOPSY     PANCREATIC CYST EXCISION     PORT-A-CATH REMOVAL Left 04/25/2020   Procedure: REMOVAL PORT-A-CATH;  Surgeon: Coralie Keens, MD;  Location: [facility];  Service: General;  Laterality: Left;   PORTACATH PLACEMENT Left 04/15/2019   Procedure: INSERTION PORT-A-CATH WITH ULTRASOUND;  Surgeon: Coralie Keens, MD;  Location: [place];  Service: General;  Laterality: Left;   RIGHT/LEFT HEART CATH AND CORONARY ANGIOGRAPHY N/A 09/28/2019   Procedure: RIGHT/LEFT HEART CATH AND CORONARY ANGIOGRAPHY;  Surgeon: Larey Dresser, MD;  Location: [facility];  Service: Cardiovascular;  Laterality: N/A;   SPLENECTOMY     TONSILLECTOMY AND ADENOIDECTOMY     Family History  Problem Relation Age of Onset   Liver disease Mother    Dementia Mother    Diabetes Mother         borderline   Coronary artery disease Father    Heart attack Father    Hypertension Father    Heart disease Father    Cancer Other        leukemia   Stroke Maternal Grandfather    Hyperlipidemia Brother        Amyloidosis   Diabetes Maternal Grandmother    Cancer Paternal Uncle        unknown   Heart attack Paternal Grandmother    Heart attack Paternal Uncle    Hypertension Brother    Diabetes Daughter        borderline   Colon cancer Neg Hx    Esophageal  cancer Neg Hx    Rectal cancer Neg Hx    Stomach cancer Neg Hx    Social History   Socioeconomic History   Marital status: Married    Spouse name: Moranda Billiot   Number of children: 2   Years of education: Not on file   Highest education level: Not on file  Occupational History   Occupation: CSR    Employer: [employer]   Occupation: retired  Tobacco Use   Smoking status: Never   Smokeless tobacco: Never  Vaping Use   Vaping Use: Never used  Substance and Sexual Activity   Alcohol use: No   Drug use: No   Sexual activity: Not Currently  Other Topics Concern   Not on file  Social History Narrative   Regular Exercise -  NO   Social Determinants of Health   Financial Resource Strain: Not on file  Food Insecurity: Food Insecurity Present (01/31/2018)   Hunger Vital Sign    Worried About Running Out of Food in the Last Year: Sometimes true    Ran Out of Food in the Last Year: Sometimes true  Transportation Needs: Not on file  Physical Activity: Inactive (01/31/2018)   Exercise Vital Sign    Days of Exercise per Week: 0 days    Minutes of Exercise per Session: 0 min  Stress: Not on file  Social Connections: Not on file    Tobacco Counseling Counseling given: Not Answered   Clinical Intake:  Pre-visit preparation completed: Yes  Pain : No/denies pain     BMI - recorded: 24.22 Nutritional Status: BMI of 19-24  Normal Diabetes: Yes CBG done?: No Did pt. bring in CBG monitor from home?:  No  How often do you need to have someone help you when you read instructions, pamphlets, or other written materials from your doctor or pharmacy?: 1 - Never  Diabetic?yes Nutrition Risk Assessment:  Has the patient had any N/V/D within the last 2 months?  No  Does the patient have any non-healing wounds?  No  Has the patient had any unintentional weight loss or weight gain?  No   Diabetes:  Is the patient diabetic?  Yes  If diabetic, was a CBG obtained today?  No  Did the patient bring in their glucometer from home?  No  How often do you monitor your CBG's? Continuous monitor .   Financial Strains and Diabetes Management:  Are you having any financial strains with the device, your supplies or your medication? No .  Does the patient want to be seen by Chronic Care Management for management of their diabetes?  No  Would the patient like to be referred to a Nutritionist or for Diabetic Management?  No   Diabetic Exams:  Diabetic Eye Exam: Overdue for diabetic eye exam. Pt has been advised about the importance in completing this exam. Patient advised to call and schedule an eye exam. Diabetic Foot Exam: Completed 07/06/21    Interpreter Needed?: No  Information entered by :: Hart Haas   Activities of Daily Living    12/13/2021    2:02 PM  In your present state of health, do you have any difficulty performing the following activities:  Hearing? 1  Vision? 0  Difficulty concentrating or making decisions? 0  Walking or climbing stairs? 0  Dressing or bathing? 0  Doing errands, shopping? 0  Preparing Food and eating ? N  Using the Toilet? N  In the past six months, have you  accidently leaked urine? N  Do you have problems with loss of bowel control? N  Managing your Medications? N  Managing your Finances? N  Housekeeping or managing your Housekeeping? N    Patient Care Team: [name], [place] as PCP - General (Internal Medicine) [name], [name], MD as  PCP - Cardiology (Cardiology) Larey Dresser, MD as PCP - Advanced Heart Failure (Cardiology) [name], RN as Oncology Nurse Navigator [name], RN as Oncology Nurse Navigator  Indicate any recent Medical Services you may have received from other than [facility] providers in the past year (date may be approximate).     Assessment:   This is a routine wellness examination for Nailani.  Hearing/Vision screen No results found.  Dietary issues and exercise activities discussed: Current Exercise Habits: Home exercise routine, Type of exercise: walking, Time (Minutes): 20, Frequency (Times/Week): 7, Weekly Exercise (Minutes/Week): 140, Intensity: Mild, Exercise limited by: None identified   Goals Addressed   None    Depression Screen    12/13/2021    1:59 PM 07/07/2021    3:29 PM 04/28/2021   10:12 AM 08/23/2020    9:16 AM 01/28/2020    1:30 PM 02/02/2019   10:06 AM 02/02/2019    9:41 AM  PHQ 2/9 Scores  PHQ - 2 Score 0 0 0 2 0 0 0  PHQ- 9 Score    5   0    Fall Risk    12/13/2021    1:58 PM 07/07/2021    3:29 PM 04/28/2021   10:12 AM 08/23/2020    9:15 AM 01/28/2020    1:30 PM  Fall Risk   Falls in the past year? 1 0 0 1 1  Number falls in past yr: 0 0 0 1 1  Injury with Fall? 0 0 0 1 0  Risk for fall due to : History of fall(s) No Fall Risks No Fall Risks Impaired balance/gait Impaired balance/gait  Follow up Falls evaluation completed Falls evaluation completed Falls evaluation completed Falls prevention discussed Falls evaluation completed    FALL RISK PREVENTION PERTAINING TO THE HOME:  Any stairs in or around the home? No  If so, are there any without handrails?  N/A Home free of loose throw rugs in walkways, pet beds, electrical cords, etc? Yes  Adequate lighting in your home to reduce risk of falls? Yes   ASSISTIVE DEVICES UTILIZED TO PREVENT FALLS:  Life alert? No  Use of a cane, walker or w/c? No  Grab bars in the bathroom? No  Shower chair or bench in  shower? No  Elevated toilet seat or a handicapped toilet? No   TIMED UP AND GO:  Was the test performed? No .    Cognitive Function:        12/13/2021    2:05 PM 02/02/2019   10:03 AM  6CIT Screen  What Year? 0 points 0 points  What month? 0 points 0 points  What time? 0 points 0 points  Count back from 20 0 points 0 points  Months in reverse 4 points 0 points  Repeat phrase 0 points 0 points  Total Score 4 points 0 points    Immunizations Immunization History  Administered Date(s) Administered   Fluad Quad(high Dose 65+) 02/18/2019, 07/01/2020, 04/28/2021   Influenza Split 04/09/2011, 06/12/2012   Influenza Whole 04/05/2009   Influenza, High Dose Seasonal PF 04/30/2016, 03/12/2017, 03/04/2018   Influenza,inj,Quad PF,6+ Mos 04/09/2013, 05/14/2014, 04/26/2015   Meningococcal Polysaccharide 08/31/2008  PFIZER(Purple Top)SARS-COV-2 Vaccination 08/30/2019, 10/03/2019   Pneumococcal Conjugate-13 09/13/2014   Pneumococcal Polysaccharide-23 08/31/2008, 12/29/2015   Td 03/26/1999, 10/18/2008   Tdap 02/02/2019   Zoster, Live 12/23/2014    TDAP status: Up to date  Flu Vaccine status: Up to date  Pneumococcal vaccine status: Up to date  Covid-19 vaccine status: Information provided on how to obtain vaccines.   Qualifies for Shingles Vaccine? Yes   Zostavax completed No   Shingrix Completed?: Yes  Screening Tests Health Maintenance  Topic Date Due   Zoster Vaccines- Shingrix (1 of 2) Never done   FOOT EXAM  02/02/2020   COVID-19 Vaccine (3 - Pfizer risk series) 05/14/2022 (Originally 10/31/2019)   INFLUENZA VACCINE  01/23/2022   HEMOGLOBIN A1C  03/18/2022   OPHTHALMOLOGY EXAM  10/27/2022   MAMMOGRAM  07/07/2023   COLONOSCOPY (Pts 45-6yr Insurance coverage will need to be confirmed)  03/19/2024   TETANUS/TDAP  02/01/2029   Pneumonia Vaccine 72 Years old  Completed   DEXA SCAN  Completed   Hepatitis C Screening  Completed   HPV VACCINES  Aged Out    Health  Maintenance  Health Maintenance Due  Topic Date Due   Zoster Vaccines- Shingrix (1 of 2) Never done   FOOT EXAM  02/02/2020    Colorectal cancer screening: Type of screening: Colonoscopy. Completed 03/19/14. Repeat every 10 years  Mammogram status: Completed 07/06/21. Repeat every year  Bone Density status: Completed 05/31/20. Results reflect: Bone density results: OSTEOPENIA. Repeat every 2 years.  Lung Cancer Screening: (Low Dose CT Chest recommended if Age 72-80years, 30 pack-year currently smoking OR have quit w/in 15years.) does not qualify.   Lung Cancer Screening Referral: N/A  Additional Screening:  Hepatitis C Screening: does qualify; Completed 04/26/15  Vision Screening: Recommended annual ophthalmology exams for early detection of glaucoma and other disorders of the eye. Is the patient up to date with their annual eye exam?  Yes  Who is the provider or what is the name of the office in which the patient attends annual eye exams? Dr.Miller If pt is not established with a provider, would they like to be referred to a provider to establish care? No .   Dental Screening: Recommended annual dental exams for proper oral hygiene  Community Resource Referral / Chronic Care Management: CRR required this visit?  No   CCM required this visit?  No      Plan:     I have personally reviewed and noted the following in the patient's chart:   Medical and social history Use of alcohol, tobacco or illicit drugs  Current medications and supplements including opioid prescriptions.  Functional ability and status Nutritional status Physical activity Advanced directives List of other physicians Hospitalizations, surgeries, and ER visits in previous 12 months Vitals Screenings to include cognitive, depression, and falls Referrals and appointments  In addition, I have reviewed and discussed with patient certain preventive protocols, quality metrics, and best practice  recommendations. A written personalized care plan for preventive services as well as general preventive health recommendations were provided to patient.   Due to this being a telephonic visit, the after visit summary with patients personalized plan was offered to patient via mail or my-chart.  Patient would like to access on my-chart.  SDuard BradyChism, [place]  12/13/2021   Nurse Notes: none

## 2021-12-13 NOTE — Patient Instructions (Signed)
Margaret Norman , Thank you for taking time to come for your Medicare Wellness Visit. I appreciate your ongoing commitment to your health goals. Please review the following plan we discussed and let me know if I can assist you in the future.   Screening recommendations/referrals: Colonoscopy: 03/19/14 due 03/19/24 Mammogram: 07/06/21 due 07/06/22 Bone Density: 05/31/20 due 05/31/22 Recommended yearly ophthalmology/optometry visit for glaucoma screening and checkup Recommended yearly dental visit for hygiene and checkup  Vaccinations: Influenza vaccine: up to date Pneumococcal vaccine: up to date Tdap vaccine: up to date Shingles vaccine: Due-May obtain vaccine at your local pharmacy.    Covid-19:Due-May obtain vaccine at your local pharmacy.   Advanced directives: no   Conditions/risks identified: see problem list   Next appointment: Follow up in one year for your annual wellness visit 12/17/22   Preventive Care 65 Years and Older, Female Preventive care refers to lifestyle choices and visits with your health care provider that can promote health and wellness. What does preventive care include? A yearly physical exam. This is also called an annual well check. Dental exams once or twice a year. Routine eye exams. Ask your health care provider how often you should have your eyes checked. Personal lifestyle choices, including: Daily care of your teeth and gums. Regular physical activity. Eating a healthy diet. Avoiding tobacco and drug use. Limiting alcohol use. Practicing safe sex. Taking low-dose aspirin every day. Taking vitamin and mineral supplements as recommended by your health care provider. What happens during an annual well check? The services and screenings done by your health care provider during your annual well check will depend on your age, overall health, lifestyle risk factors, and family history of disease. Counseling  Your health care provider may ask you questions  about your: Alcohol use. Tobacco use. Drug use. Emotional well-being. Home and relationship well-being. Sexual activity. Eating habits. History of falls. Memory and ability to understand (cognition). Work and work Statistician. Reproductive health. Screening  You may have the following tests or measurements: Height, weight, and BMI. Blood pressure. Lipid and cholesterol levels. These may be checked every 5 years, or more frequently if you are over 29 years old. Skin check. Lung cancer screening. You may have this screening every year starting at age 52 if you have a 30-pack-year history of smoking and currently smoke or have quit within the past 15 years. Fecal occult blood test (FOBT) of the stool. You may have this test every year starting at age 56. Flexible sigmoidoscopy or colonoscopy. You may have a sigmoidoscopy every 5 years or a colonoscopy every 10 years starting at age 29. Hepatitis C blood test. Hepatitis B blood test. Sexually transmitted disease (STD) testing. Diabetes screening. This is done by checking your blood sugar (glucose) after you have not eaten for a while (fasting). You may have this done every 1-3 years. Bone density scan. This is done to screen for osteoporosis. You may have this done starting at age 64. Mammogram. This may be done every 1-2 years. Talk to your health care provider about how often you should have regular mammograms. Talk with your health care provider about your test results, treatment options, and if necessary, the need for more tests. Vaccines  Your health care provider may recommend certain vaccines, such as: Influenza vaccine. This is recommended every year. Tetanus, diphtheria, and acellular pertussis (Tdap, Td) vaccine. You may need a Td booster every 10 years. Zoster vaccine. You may need this after age 53. Pneumococcal 13-valent conjugate (PCV13) vaccine.  One dose is recommended after age 66. Pneumococcal polysaccharide (PPSV23)  vaccine. One dose is recommended after age 4. Talk to your health care provider about which screenings and vaccines you need and how often you need them. This information is not intended to replace advice given to you by your health care provider. Make sure you discuss any questions you have with your health care provider. Document Released: 07/08/2015 Document Revised: 02/29/2016 Document Reviewed: 04/12/2015 Elsevier Interactive Patient Education  2017 [place] Prevention in the Home Falls can cause injuries. They can happen to people of all ages. There are many things you can do to make your home safe and to help prevent falls. What can I do on the outside of my home? Regularly fix the edges of walkways and driveways and fix any cracks. Remove anything that might make you trip as you walk through a door, such as a raised step or threshold. Trim any bushes or trees on the path to your home. Use bright outdoor lighting. Clear any walking paths of anything that might make someone trip, such as rocks or tools. Regularly check to see if handrails are loose or broken. Make sure that both sides of any steps have handrails. Any raised decks and porches should have guardrails on the edges. Have any leaves, snow, or ice cleared regularly. Use sand or salt on walking paths during winter. Clean up any spills in your garage right away. This includes oil or grease spills. What can I do in the bathroom? Use night lights. Install grab bars by the toilet and in the tub and shower. Do not use towel bars as grab bars. Use non-skid mats or decals in the tub or shower. If you need to sit down in the shower, use a plastic, non-slip stool. Keep the floor dry. Clean up any water that spills on the floor as soon as it happens. Remove soap buildup in the tub or shower regularly. Attach bath mats securely with double-sided non-slip rug tape. Do not have throw rugs and other things on the floor that  can make you trip. What can I do in the bedroom? Use night lights. Make sure that you have a light by your bed that is easy to reach. Do not use any sheets or blankets that are too big for your bed. They should not hang down onto the floor. Have a firm chair that has side arms. You can use this for support while you get dressed. Do not have throw rugs and other things on the floor that can make you trip. What can I do in the kitchen? Clean up any spills right away. Avoid walking on wet floors. Keep items that you use a lot in easy-to-reach places. If you need to reach something above you, use a strong step stool that has a grab bar. Keep electrical cords out of the way. Do not use floor polish or wax that makes floors slippery. If you must use wax, use non-skid floor wax. Do not have throw rugs and other things on the floor that can make you trip. What can I do with my stairs? Do not leave any items on the stairs. Make sure that there are handrails on both sides of the stairs and use them. Fix handrails that are broken or loose. Make sure that handrails are as long as the stairways. Check any carpeting to make sure that it is firmly attached to the stairs. Fix any carpet that is loose or  worn. Avoid having throw rugs at the top or bottom of the stairs. If you do have throw rugs, attach them to the floor with carpet tape. Make sure that you have a light switch at the top of the stairs and the bottom of the stairs. If you do not have them, ask someone to add them for you. What else can I do to help prevent falls? Wear shoes that: Do not have high heels. Have rubber bottoms. Are comfortable and fit you well. Are closed at the toe. Do not wear sandals. If you use a stepladder: Make sure that it is fully opened. Do not climb a closed stepladder. Make sure that both sides of the stepladder are locked into place. Ask someone to hold it for you, if possible. Clearly mark and make sure that you  can see: Any grab bars or handrails. First and last steps. Where the edge of each step is. Use tools that help you move around (mobility aids) if they are needed. These include: Canes. Walkers. Scooters. Crutches. Turn on the lights when you go into a dark area. Replace any light bulbs as soon as they burn out. Set up your furniture so you have a clear path. Avoid moving your furniture around. If any of your floors are uneven, fix them. If there are any pets around you, be aware of where they are. Review your medicines with your doctor. Some medicines can make you feel dizzy. This can increase your chance of falling. Ask your doctor what other things that you can do to help prevent falls. This information is not intended to replace advice given to you by your health care provider. Make sure you discuss any questions you have with your health care provider. Document Released: 04/07/2009 Document Revised: 11/17/2015 Document Reviewed: 07/16/2014 Elsevier Interactive Patient Education  2017 [employer].

## 2022-01-10 ENCOUNTER — Encounter: Payer: Self-pay | Admitting: Hematology and Oncology

## 2022-01-15 ENCOUNTER — Other Ambulatory Visit: Payer: Self-pay

## 2022-01-17 ENCOUNTER — Ambulatory Visit (INDEPENDENT_AMBULATORY_CARE_PROVIDER_SITE_OTHER): Payer: PPO | Admitting: Internal Medicine

## 2022-01-17 ENCOUNTER — Encounter: Payer: Self-pay | Admitting: Internal Medicine

## 2022-01-17 VITALS — BP 120/79 | HR 80 | Ht 62.0 in | Wt 135.6 lb

## 2022-01-17 DIAGNOSIS — E118 Type 2 diabetes mellitus with unspecified complications: Secondary | ICD-10-CM

## 2022-01-17 DIAGNOSIS — E1165 Type 2 diabetes mellitus with hyperglycemia: Secondary | ICD-10-CM | POA: Diagnosis not present

## 2022-01-17 DIAGNOSIS — Z794 Long term (current) use of insulin: Secondary | ICD-10-CM

## 2022-01-17 DIAGNOSIS — E042 Nontoxic multinodular goiter: Secondary | ICD-10-CM

## 2022-01-17 DIAGNOSIS — E785 Hyperlipidemia, unspecified: Secondary | ICD-10-CM

## 2022-01-17 LAB — POCT GLYCOSYLATED HEMOGLOBIN (HGB A1C): Hemoglobin A1C: 6.2 % — AB (ref 4.0–5.6)

## 2022-01-17 MED ORDER — LANTUS SOLOSTAR 100 UNIT/ML ~~LOC~~ SOPN: PEN_INJECTOR | SUBCUTANEOUS | 3 refills | Status: DC

## 2022-01-17 MED ORDER — FREESTYLE LIBRE 2 READER DEVI: 1.0000 | Freq: Every day | 3 refills | Status: DC

## 2022-01-17 MED ORDER — INSULIN PEN NEEDLE 32G X 4 MM MISC: 1.0000 | Freq: Every day | 3 refills | Status: DC

## 2022-01-17 MED ORDER — METFORMIN HCL 1000 MG PO TABS: 1000.0000 mg | ORAL_TABLET | Freq: Two times a day (BID) | ORAL | 3 refills | Status: DC

## 2022-01-17 NOTE — Addendum Note (Signed)
Addended by: Lauralyn Primes on: 01/17/2022 09:56 AM   Modules accepted: Orders

## 2022-01-17 NOTE — Progress Notes (Signed)
Patient ID: Margaret Norman, female   DOB: [birth year], 71 y.o.   MRN: [id number]   HPI: Margaret Norman is a 71 y.o.-year-old female, returning for follow-up for DM2, dx in 2008, insulin-dependent since 2016, uncontrolled, without long term complications.  Last visit  4 months ago.  Interim history:  No increased urination, blurry vision, chest pain.  She has a history of vertigo.  She still has some dizziness/lightheadedness.  Reviewed HbA1c levels: Lab Results  Component Value Date   HGBA1C 6.7 (A) 09/15/2021   HGBA1C 7.1 (A) 05/17/2021   HGBA1C 7.0 (A) 01/10/2021  03/19/2017: HbA1c calculated from fructosamine is 6.7%! 12/31/2016: HbA1c calculated from fructosamine: 6.9%!  Pt is on: - Metformin 1000 mg 2x a day with meals >> 2000 mg with dinner - Jardiance 25 mg daily before breakfast (PAP) - Trulicity 1.5 >> 3 mg weekly (PAP) - Lantus 30 >> 26 units at night Stopped Actos when started trulicity back. She was on Trulicity but became expensive >> changed to Actos. She was on Invokana >> recurrent UTIs.  Pt checks her sugars >4x a day:   Previously:   Lowest sugar was 60s x1 >> 83 >> 74 >> 66; she has hypoglycemia awareness in the 70s. Highest sugar was 177 >> 199 >> 200s.  Glucometer: OneTouch Verio  -No CKD, last BUN/creatinine:  Lab Results  Component Value Date   BUN 15 10/23/2021   BUN 19 07/07/2021   CREATININE 0.66 10/23/2021   CREATININE 0.86 07/07/2021  On Entresto.  -+ HL; last set of lipids: Lab Results  Component Value Date   CHOL 109 07/24/2021   HDL 46 07/24/2021   LDLCALC 47 07/24/2021   LDLDIRECT 137.9 02/04/2013   TRIG 78 07/24/2021   CHOLHDL 2.4 07/24/2021  On Crestor 10.  - last eye exam was in 10/26/2021: No DR, + cataract OS.  Dr. Marica Otter.   -+ numbness and tingling in her toes.  She has restless leg syndrome.  Last foot exam 09/15/2021.  Pt has FH of DM in MGM.  Multinodular goiter:   -Detected on palpation in 2021  Thyroid  ultrasound (01/26/2020): Parenchymal Echotexture: Moderately heterogenous Isthmus: 0.3 cm Right lobe: 6.2 cm x 2.1 cm x 2.2 cm Left lobe: 7.7 cm x 3.2 cm x 2.8 cm _________________________________________________________   Nodule # 1: Location: Right; Superior Maximum size: 0.9 cm; Other 2 dimensions: 0.7 cm x 0.7 cm Composition: spongiform (0) Spongiform nodule does not meet criteria for surveillance or biopsy  _________________________________________________________   Nodule # 2: Location: Right; Mid Maximum size: 1.0 cm; Other 2 dimensions: 0.9 cm x 0.6 cm Composition: spongiform (0) Spongiform nodule does not meet criteria for surveillance or biopsy  _________________________________________________________   Nodule # 3:  Location: Right; Mid Maximum size: 1.2 cm; Other 2 dimensions: 1.1 cm x 0.7 cm Composition: spongiform (0) Spongiform nodule does not meet criteria for surveillance or biopsy _________________________________________________________   Nodule # 4:  Location: Right; Inferior Maximum size: 1.0 cm; Other 2 dimensions: 0.8 cm x 0.8 cm Composition: spongiform (0) Spongiform nodule does not meet criteria for surveillance or biopsy  ________________________________________________________   Nodule # 5:  Location: Right; Inferior Maximum size: 1.5 cm; Other 2 dimensions: 1.4 cm x 0.8 cm Composition: solid/almost completely solid (2) Echogenicity: isoechoic (1) Nodule meets criteria for surveillance  _________________________________________________________   Nodule # 6: Location: Left; Superior Maximum size: 1.2 cm; Other 2 dimensions: 1.1 cm x 0.9 cm  Composition: cannot determine (2) Echogenicity: isoechoic (1)  Echogenic foci: macrocalcifications (1)  Nodule meets criteria for surveillance  _______________________________________________________   Nodule # 7: Location: Left; Inferior Maximum size: 4.7 cm; Other 2 dimensions: 4.7 cm x 2.8  cm Composition: mixed cystic and solid (1) Echogenicity: isoechoic (1) Echogenic foci: macrocalcifications (1) Nodule meets criteria for biopsy  ______________________________________________________   No adenopathy   IMPRESSION: Multinodular thyroid.   Left inferior thyroid nodule (labeled 7, TR , 4.7 cm) meets criteria for biopsy, as designated by the newly established ACR TI-RADS criteria, and referral for biopsy is recommended.   Right inferior thyroid nodule (labeled 5, 1.5 cm, TR 3) and the left superior thyroid nodule (labeled 6, 1.2 cm, TR 4) both meet criteria for surveillance, as designated by the newly established ACR TI-RADS criteria. Surveillance ultrasound study recommended to be performed annually up to 5 years.   FNA left inferior thyroid nodule (02/16/2020): Benign  Thyroid U/S (02/07/2021): Parenchymal Echotexture: Markedly heterogenous Isthmus: 0.6 cm Right lobe: 7.4 cm x 2.0 cm x 2.5 cm Left lobe: 8.4 cm x 3.2 cm x 4.3 cm _________________________________________________________   Estimated total number of nodules >/= 1 cm: 6-10  _________________________________________________________   Nodule labeled 1 superior right thyroid, slightly smaller than previous and spongiform. Nodule does not meet criteria for surveillance or biopsy   Nodule labeled 2, right thyroid, TR 2 with cystic components measures less than 1 cm. Nodule does not meet criteria for surveillance or biopsy   Nodule labeled 3, mid right thyroid. Spongiform characteristics measuring 1.3 cm. Nodule does not meet criteria for surveillance or biopsy.   Nodule labeled 4, mid right thyroid, 9 mm, spongiform characteristics. Nodule does not meet criteria for surveillance or biopsy.   Nodule labeled 5, 0.98 cm, spongiform characteristics and does not meet criteria for surveillance or biopsy.   Nodule 6, right lower thyroid, 0.96 cm. Nodule has spongiform characteristics and does not  meet criteria for surveillance or biopsy.   Nodule 7, right lower thyroid, smaller than previous, now 1.0 cm. Previous nodule was labeled 5, 1.5 cm. Nodule has clearly spongiform characteristics on the current ultrasound, and has decreased in size below 1 cm. Nodule no longer meets criteria for surveillance.   Nodule labeled 8 on the left, upper left thyroid, 9 mm. Nodule has internal cystic change on the current, TR 2 characteristics, and has decreased in size below 1 cm. Nodule no longer meets criteria for continued surveillance.   The nodules at the left inferior thyroid labeled 9, 10, 11, appear to all be part of the previous 4.7 cm nodule which was previously biopsied 02/16/2020, and are doubtful to represent independent nodules given the available images and the comparison.   No adenopathy   Recommendations follow those established by the new ACR TI-RADS criteria (J Am Coll Radiol 2017;14:587-595).   IMPRESSION: Enlarged multinodular thyroid, as above.   Nodule inferior left thyroid has been previously biopsied, as above. Assuming benign result, no further specific follow-up would be indicated.  Pt denies: - feeling nodules in neck - hoarseness - dysphagia - choking  Latest TSH was normal Lab Results  Component Value Date   TSH 1.544 10/10/2020   She sees Cardiology - Dr. Aundra Dubin >> on [place]. She also has a history of breast cancer, s/p chemoradiation therapy. She fractured humerus in 02/2020 after tripping >> this is healing.  She had a DXA scan >> normal.  She is on Arimidex.  She had lightheadedness and dizziness at last visit.  She was in the emergency  room with vertigo 06/30/2021.  ROS: + See HPI Neurological: no tremors/+ numbness/+ tingling/+ dizziness  I reviewed pt's medications, allergies, PMH, social hx, family hx, and changes were documented in the history of present illness. Otherwise, unchanged from my initial visit note.  Past Medical  History:  Diagnosis Date   Arthritis    Chest pain    Nuclear, adenosine,  December, 2013, low risk nuclear scan with small, moderate in intensity, fixed anteroseptal defect. This is possibly related to an LBBB versus small prior infarct. 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 w/appy   CHOLECYSTECTOMY     ESOPHAGOGASTRODUODENOSCOPY     HERNIA REPAIR     KNEE ARTHROSCOPY Right 11/2005   LIVER BIOPSY     PANCREATIC CYST EXCISION     PORT-A-CATH REMOVAL Left 04/25/2020   Procedure: REMOVAL PORT-A-CATH;  Surgeon: Coralie Keens, MD;  Location: [facility];  Service: General;  Laterality: Left;   PORTACATH PLACEMENT Left 04/15/2019   Procedure: INSERTION PORT-A-CATH WITH ULTRASOUND;  Surgeon: Coralie Keens, MD;  Location: [facility];  Service: General;  Laterality: Left;   RIGHT/LEFT HEART CATH AND CORONARY ANGIOGRAPHY N/A 09/28/2019   Procedure: RIGHT/LEFT HEART CATH AND CORONARY ANGIOGRAPHY;  Surgeon: Larey Dresser, MD;  Location: [facility];  Service: Cardiovascular;  Laterality: N/A;   SPLENECTOMY      TONSILLECTOMY AND ADENOIDECTOMY     Social History   Socioeconomic History   Marital status: Married    Spouse name: Rayna Brenner   Number of children: 2   Years of education: Not on file   Highest education level: Not on file  Occupational History   Occupation: CSR    Employer: [employer]   Occupation: retired  Tobacco Use   Smoking status: Never   Smokeless tobacco: Never  Vaping Use   Vaping Use: Never used  Substance and Sexual Activity   Alcohol use: No   Drug use: No   Sexual activity: Not Currently  Other Topics Concern   Not on file  Social History Narrative   Regular Exercise -  NO   Social Determinants of Health   Financial Resource Strain: Low Risk  (01/28/2020)   Overall Financial Resource Strain (CARDIA)    Difficulty of Paying Living Expenses: Not hard at all  Food Insecurity: No Food Insecurity (01/28/2020)   Hunger Vital Sign    Worried About Running Out of Food in the Last Year: Never true    [place] in the Last Year: Never  true  Transportation Needs: No Transportation Needs (01/28/2020)   PRAPARE - Hydrologist (Medical): No    Lack of Transportation (Non-Medical): No  Physical Activity: Sufficiently Active (01/28/2020)   Exercise Vital Sign    Days of Exercise per Week: 5 days    Minutes of Exercise per Session: 30 min  Stress: No Stress Concern Present (12/13/2021)   [place]    Feeling of Stress : Not at all  Social Connections: [place] (01/28/2020)   Social Connection and Isolation Panel [NHANES]    Frequency of Communication with Friends and Family: More than three times a week    Frequency of Social Gatherings with Friends and Family: More than three times a week    Attends Religious Services: More than 4 times per year    Active Member of [employer] or Organizations: Yes    Attends Music therapist: More than 4 times  per year    Marital Status: Married  Human resources officer Violence: Not At Risk (01/28/2020)   Humiliation, Afraid, Rape, and Kick questionnaire    Fear of Current or Ex-Partner: No    Emotionally Abused: No    Physically Abused: No    Sexually Abused: No   Current Outpatient Medications on File Prior to Visit  Medication Sig Dispense Refill   acetaminophen (TYLENOL) 500 MG tablet Take 1,000 mg by mouth every 6 (six) hours as needed for moderate pain.     anastrozole (ARIMIDEX) 1 MG tablet Take 1 tablet (1 mg total) by mouth daily. 90 tablet 3   aspirin 81 MG EC tablet Take 81 mg by mouth daily.     Blood Glucose Monitoring Suppl (ONETOUCH VERIO FLEX SYSTEM) W/DEVICE KIT 1 kit by Does not apply route 3 (three) times daily. 1 kit 1   buPROPion (WELLBUTRIN XL) 150 MG 24 hr tablet TAKE 1 TABLET BY MOUTH DAILY IN THE MORNING 90 tablet 1   carvedilol (COREG) 6.25 MG tablet Take 1 tablet (6.25 mg total) by mouth 2 (two) times daily. 180 tablet 3   Cholecalciferol (VITAMIN D) 50 MCG (2000 UT) tablet Take 2,000 Units by mouth daily.     Continuous Blood Gluc Receiver (FREESTYLE LIBRE 2 READER) DEVI 1 each by Does not apply route daily. 1 each 0   fluticasone (FLONASE) 50 MCG/ACT nasal spray Place into both nostrils as needed for allergies or rhinitis.     glucose blood (ONETOUCH VERIO) test strip Test three times daily as directed. DX: E11.9 300 each 3   Insulin Pen Needle 32G X 4 MM MISC 1 each by Other route daily. E11.9; THIS IS THE CORRECT PEN NEEDLE. PLEASE FILL RX AS ORDERED 100 each 3   JARDIANCE 25 MG TABS tablet TAKE 1 TABLET BY MOUTH DAILY 90 tablet 3   LANTUS SOLOSTAR 100 UNIT/ML Solostar Pen INJECT SUBCUTANEOUSLY 26  UNITS AT BEDTIME 30 mL 3   meclizine (ANTIVERT) 25 MG tablet Take 25 mg by mouth 3 (three) times daily as needed for dizziness.     metFORMIN (GLUCOPHAGE) 1000 MG tablet TAKE 1 TABLET BY MOUTH TWICE DAILY WITH A MEAL 180 tablet 3   omeprazole (PRILOSEC) 40 MG capsule Take 40 mg  by mouth daily.     OneTouch Delica Lancets 76P MISC 1 each by Does not apply route daily. E11.9 100 each 0   Probiotic Product (PROBIOTIC ADVANCED PO) Take 1 tablet by mouth at bedtime.  rosuvastatin (CRESTOR) 10 MG tablet Take 1 tablet (10 mg total) by mouth daily. 90 tablet 1   sacubitril-valsartan (ENTRESTO) 24-26 MG Take 1 tablet by mouth 2 (two) times daily. 60 tablet 11   sertraline (ZOLOFT) 100 MG tablet Take 1 tablet (100 mg total) by mouth daily. 90 tablet 3   spironolactone (ALDACTONE) 25 MG tablet TAKE 1 TABLET BY MOUTH DAILY 90 tablet 3   TRULICITY 3 HF/0.2OV SOPN INJECT 3 MG INTO THE SKIN  WEEKLY 6 mL 1   vitamin B-12 (CYANOCOBALAMIN) 100 MCG tablet Take 100 mcg by mouth daily.     Vitamin E 180 MG CAPS Take 180 mg by mouth at bedtime.      No current facility-administered medications on file prior to visit.   Allergies  Allergen Reactions   Invokana [Canagliflozin] Other (See Comments)    UTI's   Amoxicillin Other (See Comments)    Sore tongue Did it involve swelling of the face/tongue/throat, SOB, or low BP? No Did it involve sudden or severe rash/hives, skin peeling, or any reaction on the inside of your mouth or nose? No Did you need to seek medical attention at a hospital or doctor's office? No When did it last happen?      5-10 years ago If all above answers are "NO", may proceed with cephalosporin use.    Family History  Problem Relation Age of Onset   Liver disease Mother    Dementia Mother    Diabetes Mother        borderline   Coronary artery disease Father    Heart attack Father    Hypertension Father    Heart disease Father    Cancer Other        leukemia   Stroke Maternal Grandfather    Hyperlipidemia Brother        Amyloidosis   Diabetes Maternal Grandmother    Cancer Paternal Uncle        unknown   Heart attack Paternal Grandmother    Heart attack Paternal Uncle    Hypertension Brother    Diabetes Daughter        borderline   Colon  cancer Neg Hx    Esophageal cancer Neg Hx    Rectal cancer Neg Hx    Stomach cancer Neg Hx     PE: BP 120/79 (BP Location: Right Arm, Patient Position: Sitting, Cuff Size: Normal)   Pulse 80   Ht _0  (1.575 m)   Wt 135 lb 9.6 oz (61.5 kg)   SpO2 97%   BMI 24.80 kg/m   Wt Readings from Last 3 Encounters:  01/17/22 135 lb 9.6 oz (61.5 kg)  10/23/21 137 lb 9.6 oz (62.4 kg)  09/15/21 136 lb 12.8 oz (62.1 kg)   Constitutional: Normal weight, in NAD Eyes: EOMI, no exophthalmos ENT: moist mucous membranes, + full thyroid on palpation (L>R lobe), no cervical lymphadenopathy Cardiovascular: RRR, No MRG Respiratory: CTA B Musculoskeletal: no deformities Skin: moist, warm, no rashes Neurological: no tremor with outstretched hands  ASSESSMENT: 1. DM2, insulin-dependent, uncontrolled, without long term complications, but with hyperglycemia  2. HL  3.  Multiple thyroid nodules  PLAN:  1. Patient with longstanding, uncontrolled, type 2 diabetes, on oral antidiabetic regimen with metformin, SGLT2 inhibitor, and also basal insulin and weekly GLP-1 receptor agonist, with improved blood sugars at last visit.  HbA1c was 6.7% at that time.  Sugars were dropping overnight in the lower end of the target range, occasionally even under  70.  They were slightly higher after breakfast and lunch, but still within target range and slightly higher after dinner.  We discussed about trying to improve her dinners, but I also advised her to reduce the Lantus dose to avoid further drops in blood sugars overnight. CGM interpretation: -At today's visit, we reviewed her CGM downloads: It appears that 91% of values are in target range (goal >70%), while 9% are higher than 180 (goal <25%), and 0% are lower than 70 (goal <4%).  The calculated average blood sugar is 130.  The projected HbA1c for the next 3 months (GMI) is 6.4%. -Reviewing the CGM trends, it appears that sugars are very well controlled, without any  more lows now blood with occasional higher blood sugars, >180, after dinner.  We discussed about trying to improve this meal.  She also mentions that sometimes she is days later she is working on moving it earlier in the day.  At next visit, if sugars after dinner are now better, we can try a meglitinide with larger meals.  For now, I did not recommend a change in regimen. - I suggested to:  Patient Instructions  Please continue: - Metformin 2000 mg with dinner - Jardiance 25 mg daily before breakfast - Trulicity 3 mg weekly - Lantus 26 units at night   Try to work on the dinners.  Please return in 4 months.   - we checked her HbA1c: 6.2% (better) - advised to check sugars at different times of the day - 4x a day, rotating check times - advised for yearly eye exams >> she is UTD - return to clinic in 4 months  2. HL -Reviewed latest lipid panel from 06/2021: All fractions at goal: Lab Results  Component Value Date   CHOL 109 07/24/2021   HDL 46 07/24/2021   LDLCALC 47 07/24/2021   LDLDIRECT 137.9 02/04/2013   TRIG 78 07/24/2021   CHOLHDL 2.4 07/24/2021  -Continues on Crestor 10 mg daily without side effects  3.  Thyroid nodules -Her left thyroid lobe was enlarged on exam so we checked a thyroid ultrasound in 01/2020.  She had several nodules, of which the left inferior nodule had an indication for biopsy.  She had a biopsy performed and this was benign.  For the 2 of the other nodules, a follow-up was indicated in 1 year.  She had another thyroid ultrasound on 02/07/2021.  The nodules appeared to be either stable or decreased in size.  No imaging follow-up was indicated. -No neck compression symptoms at today's visit and no individual masses felt on palpation -Latest TSH was normal in 09/2020: Lab Results  Component Value Date   TSH 1.544 10/10/2020  -We will continue to follow her clinically for now  Philemon Kingdom, MD PhD [facility] Endocrinology

## 2022-01-17 NOTE — Patient Instructions (Signed)
Please continue: - Metformin 2000 mg with dinner - Jardiance 25 mg daily before breakfast - Trulicity 3 mg weekly - Lantus 26 units at night   Try to work on the dinners.  Please return in 4 months.

## 2022-01-22 ENCOUNTER — Other Ambulatory Visit: Payer: Self-pay

## 2022-01-22 MED ORDER — ROSUVASTATIN CALCIUM 10 MG PO TABS: 10.0000 mg | ORAL_TABLET | Freq: Every day | ORAL | 0 refills | Status: DC

## 2022-01-23 ENCOUNTER — Other Ambulatory Visit: Payer: Self-pay

## 2022-01-29 ENCOUNTER — Other Ambulatory Visit: Payer: Self-pay | Admitting: Physician Assistant

## 2022-01-29 ENCOUNTER — Other Ambulatory Visit: Payer: Self-pay | Admitting: Family

## 2022-02-19 ENCOUNTER — Encounter: Payer: Self-pay | Admitting: Hematology and Oncology

## 2022-02-20 ENCOUNTER — Encounter: Payer: Self-pay | Admitting: Family

## 2022-02-20 ENCOUNTER — Ambulatory Visit (INDEPENDENT_AMBULATORY_CARE_PROVIDER_SITE_OTHER): Payer: HMO | Admitting: Family

## 2022-02-20 VITALS — BP 122/64 | HR 83 | Temp 98.3°F | Ht 62.0 in | Wt 136.6 lb

## 2022-02-20 DIAGNOSIS — J019 Acute sinusitis, unspecified: Secondary | ICD-10-CM

## 2022-02-20 DIAGNOSIS — J209 Acute bronchitis, unspecified: Secondary | ICD-10-CM | POA: Diagnosis not present

## 2022-02-20 MED ORDER — HYDROCODONE BIT-HOMATROP MBR 5-1.5 MG/5ML PO SOLN
5.0000 mL | Freq: Three times a day (TID) | ORAL | 0 refills | Status: DC | PRN
Start: 2022-02-20 — End: 2022-12-18

## 2022-02-20 MED ORDER — DOXYCYCLINE HYCLATE 100 MG PO TABS: 100.0000 mg | ORAL_TABLET | Freq: Two times a day (BID) | ORAL | 0 refills | Status: DC

## 2022-02-20 MED ORDER — BENZONATATE 100 MG PO CAPS: 100.0000 mg | ORAL_CAPSULE | Freq: Three times a day (TID) | ORAL | 0 refills | Status: DC | PRN

## 2022-02-20 MED ORDER — FLUTICASONE PROPIONATE 50 MCG/ACT NA SUSP: 2.0000 | Freq: Every day | NASAL | 1 refills | Status: DC

## 2022-02-20 NOTE — Progress Notes (Signed)
Margaret Norman is a 71 y.o. female with the following history as recorded in EpicCare:  Patient Active Problem List   Diagnosis Date Noted   Multiple thyroid nodules 09/20/2020   Port-A-Cath in place 06/10/2019   Breast cancer ([place]) 04/15/2019   Second degree AV block    Malignant neoplasm of upper-outer quadrant of right breast in female, estrogen receptor positive ([place]) 03/25/2019   Thyromegaly 01/10/2018   Overweight (BMI 25.0-29.9) 08/13/2017   Upper respiratory tract infection 07/30/2017   Type 2 diabetes mellitus with hyperglycemia, with long-term current use of insulin ([place]) 03/19/2017   Thiamine deficiency 01/04/2017   Tinnitus aurium, 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   Deficiency anemia 04/30/2016   Routine general medical examination at a health care facility 04/27/2015   Abdominal wall hernia 04/26/2015   Family history of hemochromatosis 09/13/2014   Acute maxillary sinusitis 05/14/2014   Personal history of colonic polyps 03/10/2014   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   Lumbosacral spondylosis without myelopathy 05/12/2013   Insomnia, persistent 02/04/2013   Obesity (BMI 30-39.9) 02/04/2013   Unspecified asthma, with exacerbation 09/22/2012   Visit for screening mammogram 07/04/2012   Nephrolithiasis    HTN (hypertension)    Gallstones    LBBB (left bundle branch block)    Leukocytosis 12/20/2008   Hyperlipidemia with target LDL less than 100 09/04/2007   Chronic depression 09/04/2007   GERD 09/04/2007    Current Outpatient Medications  Medication Sig Dispense Refill   acetaminophen (TYLENOL) 500 MG tablet Take 1,000 mg by mouth every 6 (six) hours as needed for moderate pain.     anastrozole (ARIMIDEX) 1 MG tablet Take 1 tablet (1 mg total) by mouth daily. 90 tablet 3   aspirin 81 MG EC tablet Take 81 mg by mouth daily.     benzonatate (TESSALON) 100 MG capsule Take 1 capsule (100 mg total) by mouth 3 (three) times daily as needed. 20 capsule 0   Blood Glucose  Monitoring Suppl (ONETOUCH VERIO FLEX SYSTEM) W/DEVICE KIT 1 kit by Does not apply route 3 (three) times daily. 1 kit 1   buPROPion (WELLBUTRIN XL) 150 MG 24 hr tablet TAKE 1 TABLET BY MOUTH DAILY IN THE MORNING 90 tablet 0   carvedilol (COREG) 6.25 MG tablet Take 1 tablet (6.25 mg total) by mouth 2 (two) times daily. 180 tablet 3   Cholecalciferol (VITAMIN D) 50 MCG (2000 UT) tablet Take 2,000 Units by mouth daily.     Continuous Blood Gluc Receiver (FREESTYLE LIBRE 2 READER) DEVI 1 each by Does not apply route daily. 6 each 3   doxycycline (VIBRA-TABS) 100 MG tablet Take 1 tablet (100 mg total) by mouth 2 (two) times daily. 14 tablet 0   glucose blood (ONETOUCH VERIO) test strip Test three times daily as directed. DX: E11.9 300 each 3   HYDROcodone bit-homatropine (HYCODAN) 5-1.5 MG/5ML syrup Take 5 mLs by mouth every 8 (eight) hours as needed for cough. 120 mL 0   Insulin Pen Needle 32G X 4 MM MISC 1 each by Other route daily. E11.9; THIS IS THE CORRECT PEN NEEDLE. PLEASE FILL RX AS ORDERED 100 each 3   JARDIANCE 25 MG TABS tablet TAKE 1 TABLET BY MOUTH DAILY 90 tablet 3   LANTUS SOLOSTAR 100 UNIT/ML Solostar Pen INJECT SUBCUTANEOUSLY 26  UNITS AT BEDTIME 30 mL 3   meclizine (ANTIVERT) 25 MG tablet Take 25 mg by mouth 3 (three) times daily as needed for dizziness.     metFORMIN (GLUCOPHAGE) 1000 MG tablet  Take 1 tablet (1,000 mg total) by mouth 2 (two) times daily with a meal. 180 tablet 3   omeprazole (PRILOSEC) 40 MG capsule TAKE 1 CAPSULE BY MOUTH 30-60 MINUTES PRIOR TO BREAKFAST AND DINNER 60 capsule 3   OneTouch Delica Lancets 16X MISC 1 each by Does not apply route daily. E11.9 100 each 0   Probiotic Product (PROBIOTIC ADVANCED PO) Take 1 tablet by mouth at bedtime.      rosuvastatin (CRESTOR) 10 MG tablet Take 1 tablet (10 mg total) by mouth daily. 30 tablet 0   sacubitril-valsartan (ENTRESTO) 24-26 MG Take 1 tablet by mouth 2 (two) times daily. 60 tablet 11   sertraline (ZOLOFT) 100 MG  tablet Take 1 tablet (100 mg total) by mouth daily. 90 tablet 3   spironolactone (ALDACTONE) 25 MG tablet TAKE 1 TABLET BY MOUTH DAILY 90 tablet 3   TRULICITY 3 WR/6.0AV SOPN INJECT 3 MG INTO THE SKIN  WEEKLY 6 mL 1   vitamin B-12 (CYANOCOBALAMIN) 100 MCG tablet Take 100 mcg by mouth daily.     Vitamin E 180 MG CAPS Take 180 mg by mouth at bedtime.      fluticasone (FLONASE) 50 MCG/ACT nasal spray Place 2 sprays into both nostrils daily. 16 g 1   No current facility-administered medications for this visit.    Allergies: Invokana [canagliflozin] and Amoxicillin  Past Medical History:  Diagnosis Date   Arthritis    Chest pain    Nuclear, adenosine,  December, 2013, low risk nuclear scan with small, moderate in intensity, fixed anteroseptal defect. This is possibly related to an LBBB versus small prior infarct. 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 w/appy   CHOLECYSTECTOMY     ESOPHAGOGASTRODUODENOSCOPY     HERNIA REPAIR     KNEE ARTHROSCOPY Right 11/2005   LIVER BIOPSY     PANCREATIC CYST EXCISION     PORT-A-CATH REMOVAL  Left 04/25/2020   Procedure: REMOVAL PORT-A-CATH;  Surgeon: Coralie Keens, MD;  Location: [facility];  Service: General;  Laterality: Left;   PORTACATH PLACEMENT Left 04/15/2019   Procedure: INSERTION PORT-A-CATH WITH ULTRASOUND;  Surgeon: Coralie Keens, MD;  Location: [facility];  Service: General;  Laterality: Left;   RIGHT/LEFT HEART CATH AND CORONARY ANGIOGRAPHY N/A 09/28/2019   Procedure: RIGHT/LEFT HEART CATH AND CORONARY ANGIOGRAPHY;  Surgeon: Larey Dresser, MD;  Location: [facility];  Service: Cardiovascular;  Laterality: N/A;   SPLENECTOMY     TONSILLECTOMY AND ADENOIDECTOMY      Family History  Problem Relation Age of Onset   Liver disease Mother    Dementia Mother    Diabetes Mother        borderline   Coronary artery disease Father    Heart attack Father    Hypertension Father    Heart disease Father    Cancer Other        leukemia   Stroke Maternal Grandfather    Hyperlipidemia Brother        Amyloidosis   Diabetes Maternal Grandmother    Cancer Paternal Uncle        unknown   Heart attack Paternal Grandmother    Heart attack Paternal Uncle    Hypertension Brother    Diabetes Daughter        borderline   Colon cancer Neg Hx    Esophageal cancer Neg Hx    Rectal cancer Neg Hx    Stomach cancer Neg Hx     Social History   Tobacco Use   Smoking status: Never   Smokeless tobacco: Never  Substance Use Topics   Alcohol use: No    Subjective:  Patient presents with concerns for 1 week history of cough/ congestion; feels like started as sinus drainage and moving into chest; using OTC Mucinex; no fever; not sleeping well due to cough;      Objective:  Vitals:   02/20/22 1309  BP: 122/64  Pulse: 83  Temp: 98.3 F (36.8 C)  TempSrc: Oral  SpO2: 98%  Weight: 136 lb 9.6 oz (62 kg)  Height: 5' 2"  (1.575 m)    General: Well developed, well nourished, in no acute distress  Skin : Warm and dry.  Head: Normocephalic and atraumatic   Eyes: Sclera and conjunctiva clear; pupils round and reactive to light; extraocular movements intact  Ears: External normal; canals clear; tympanic membranes normal  Oropharynx: Pink, supple. No suspicious lesions  Neck: Supple without thyromegaly, adenopathy  Lungs: Respirations unlabored; clear to auscultation bilaterally without wheeze, rales, rhonchi  CVS exam: normal rate and regular rhythm.  Neurologic: Alert and oriented; speech intact; face symmetrical; moves all extremities well; CNII-XII intact without focal deficit   Assessment:  1. Acute sinusitis, recurrence not specified, unspecified location   2. Acute bronchitis, unspecified organism     Plan:  Suspect allergy component; Rx for Doxycycline 100 mg bid x 7 days; re-start Flonase; Rx for Tessalon and Hycodan; increase fluids, rest and follow up worse, no better.   No follow-ups on file.  No orders of the defined types were placed in this encounter.   Requested Prescriptions   Signed Prescriptions Disp Refills   fluticasone (FLONASE) 50 MCG/ACT nasal spray 16 g 1    Sig: Place 2 sprays into both nostrils daily.   doxycycline (VIBRA-TABS) 100 MG tablet 14 tablet 0    Sig: Take 1 tablet (100 mg total) by mouth 2 (two) times daily.   benzonatate (TESSALON) 100 MG capsule 20 capsule 0    Sig: Take 1 capsule (100 mg total) by mouth 3 (three) times daily as needed.   HYDROcodone bit-homatropine (HYCODAN) 5-1.5 MG/5ML syrup 120 mL 0    Sig: Take 5 mLs by mouth every 8 (eight) hours as needed for cough.

## 2022-02-25 ENCOUNTER — Encounter: Payer: Self-pay | Admitting: Family

## 2022-02-27 ENCOUNTER — Other Ambulatory Visit: Payer: Self-pay | Admitting: Family

## 2022-03-05 ENCOUNTER — Encounter: Payer: Self-pay | Admitting: Family

## 2022-03-06 ENCOUNTER — Other Ambulatory Visit: Payer: Self-pay | Admitting: Family

## 2022-03-06 ENCOUNTER — Encounter: Payer: Self-pay | Admitting: Family

## 2022-03-06 MED ORDER — DOXYCYCLINE HYCLATE 100 MG PO TABS: 100.0000 mg | ORAL_TABLET | Freq: Two times a day (BID) | ORAL | 0 refills | Status: DC

## 2022-03-14 ENCOUNTER — Telehealth: Payer: Self-pay | Admitting: Hematology and Oncology

## 2022-03-14 NOTE — Telephone Encounter (Signed)
Rescheduled appointment per provider [facility]. Patient is aware of the changes made to her upcoming appointment. 

## 2022-03-28 ENCOUNTER — Other Ambulatory Visit: Payer: Self-pay | Admitting: Obstetrics and Gynecology

## 2022-03-28 DIAGNOSIS — Z1231 Encounter for screening mammogram for malignant neoplasm of breast: Secondary | ICD-10-CM

## 2022-03-30 ENCOUNTER — Ambulatory Visit: Payer: HMO

## 2022-04-04 ENCOUNTER — Ambulatory Visit: Payer: HMO | Admitting: Hematology and Oncology

## 2022-04-05 NOTE — Progress Notes (Signed)
Patient Care Team: [name], [place] as PCP - General (Internal Medicine) [name], [name], MD as PCP - Cardiology (Cardiology) Larey Dresser, MD as PCP - Advanced Heart Failure (Cardiology) [name], RN as Oncology Nurse Navigator [name], RN as Oncology Nurse Navigator  DIAGNOSIS:  Encounter Diagnosis  Name Primary?   Malignant neoplasm of upper-outer quadrant of right breast in female, estrogen receptor positive ([place])     SUMMARY OF ONCOLOGIC HISTORY: Oncology History  Malignant neoplasm of upper-outer quadrant of right breast in female, estrogen receptor positive ([place])  03/16/2019 Cancer Staging   Staging form: Breast, AJCC 8th Edition - Clinical stage from 03/16/2019: Stage IA (cT1c, cN0, cM0, G2, ER+, PR+, HER2+) - Signed by [name], NP on 03/25/2019   03/25/2019 Initial Diagnosis   Routine screening mammogram detected a 1.3cm mass in the right breast, 9 o'clock position, no axillary adenopathy. 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    04/15/2019 Surgery   Right lumpectomy [name]): IDC, grade 2, 1.3cm, with intermediate grade DCIS, clear margins, and 1 right axillary lymph node negative   04/15/2019 Cancer Staging   Staging form: Breast, AJCC 8th Edition - Pathologic stage from 04/15/2019: Stage IA (pT1c, pN0, cM0, G2, ER+, PR+, HER2+) - Signed by [name], NP on 04/29/2019   05/13/2019 -  Chemotherapy   Weekly Taxol and Herceptin x 12, then maintenance Herceptin every 3 weeks x 1 year   08/20/2019 -  Radiation Therapy   Adjuvant radiation   09/2019 -  Anti-estrogen oral therapy   Anastrozole     CHIEF COMPLIANT: Follow-up of breast cancer on anastrozole   INTERVAL HISTORY: Margaret Norman is a 71 y.o. with above-mentioned history of breast cancer who underwent a lumpectomy, radiation, and is currently on antiestrogen therapy with anastrozole. She  presents to the clinic for a follow-up. She reports no issues or new symptoms. Does have manageable hot flashes and some joint stiffness. She states she does have some pain under the left arm. Denies numbness and tingling in fingers. But she notice tingling in her feet on a scale 1-10 she rates it around 3 or 4    ALLERGIES:  is allergic to invokana [canagliflozin] and amoxicillin.  MEDICATIONS:  Current Outpatient Medications  Medication Sig Dispense Refill   acetaminophen (TYLENOL) 500 MG tablet Take 1,000 mg by mouth every 6 (six) hours as needed for moderate pain.     anastrozole (ARIMIDEX) 1 MG tablet Take 1 tablet (1 mg total) by mouth daily. 90 tablet 3   aspirin 81 MG EC tablet Take 81 mg by mouth daily.     benzonatate (TESSALON) 100 MG capsule Take 1 capsule (100 mg total) by mouth 3 (three) times daily as needed. 20 capsule 0   Blood Glucose Monitoring Suppl (ONETOUCH VERIO FLEX SYSTEM) W/DEVICE KIT 1 kit by Does not apply route 3 (three) times daily. 1 kit 1   buPROPion (WELLBUTRIN XL) 150 MG 24 hr tablet TAKE 1 TABLET BY MOUTH DAILY IN THE MORNING 90 tablet 0   carvedilol (COREG) 6.25 MG tablet Take 1 tablet (6.25 mg total) by mouth 2 (two) times daily. 180 tablet 3   Cholecalciferol (VITAMIN D) 50 MCG (2000 UT) tablet Take 2,000 Units by mouth daily.     Continuous Blood Gluc Receiver (FREESTYLE LIBRE 2 READER) DEVI 1 each by Does not apply route daily. 6 each 3  Continuous Blood Gluc Sensor (FREESTYLE LIBRE 2 SENSOR) MISC APPLY AS DIRECTED EVERY 14 DAYS     doxycycline (VIBRA-TABS) 100 MG tablet Take 1 tablet (100 mg total) by mouth 2 (two) times daily. 14 tablet 0   fluticasone (FLONASE) 50 MCG/ACT nasal spray Place 2 sprays into both nostrils daily. 16 g 1   glucose blood (ONETOUCH VERIO) test strip Test three times daily as directed. DX: E11.9 300 each 3   HYDROcodone bit-homatropine (HYCODAN) 5-1.5 MG/5ML syrup Take 5 mLs by mouth every 8 (eight) hours as needed for cough.  120 mL 0   Insulin Pen Needle 32G X 4 MM MISC 1 each by Other route daily. E11.9; THIS IS THE CORRECT PEN NEEDLE. PLEASE FILL RX AS ORDERED 100 each 3   JARDIANCE 25 MG TABS tablet TAKE 1 TABLET BY MOUTH DAILY 90 tablet 3   LANTUS SOLOSTAR 100 UNIT/ML Solostar Pen INJECT SUBCUTANEOUSLY 26  UNITS AT BEDTIME 30 mL 3   meclizine (ANTIVERT) 25 MG tablet Take 25 mg by mouth 3 (three) times daily as needed for dizziness.     metFORMIN (GLUCOPHAGE) 1000 MG tablet Take 1 tablet (1,000 mg total) by mouth 2 (two) times daily with a meal. 180 tablet 3   omeprazole (PRILOSEC) 40 MG capsule TAKE 1 CAPSULE BY MOUTH 30-60 MINUTES PRIOR TO BREAKFAST AND DINNER 60 capsule 3   OneTouch Delica Lancets 82S MISC 1 each by Does not apply route daily. E11.9 100 each 0   Probiotic Product (PROBIOTIC ADVANCED PO) Take 1 tablet by mouth at bedtime.      rosuvastatin (CRESTOR) 10 MG tablet TAKE 1 TABLET BY MOUTH DAILY 30 tablet 11   sacubitril-valsartan (ENTRESTO) 24-26 MG Take 1 tablet by mouth 2 (two) times daily. 60 tablet 11   sertraline (ZOLOFT) 100 MG tablet Take 1 tablet (100 mg total) by mouth daily. 90 tablet 3   spironolactone (ALDACTONE) 25 MG tablet TAKE 1 TABLET BY MOUTH DAILY 90 tablet 3   TRULICITY 3 UO/1.5IF SOPN INJECT 3 MG INTO THE SKIN  WEEKLY 6 mL 1   vitamin B-12 (CYANOCOBALAMIN) 100 MCG tablet Take 100 mcg by mouth daily.     Vitamin E 180 MG CAPS Take 180 mg by mouth at bedtime.      No current facility-administered medications for this visit.    PHYSICAL EXAMINATION: ECOG PERFORMANCE STATUS: 1 - Symptomatic but completely ambulatory  Vitals:   04/09/22 0952  BP: 124/66  Pulse: 86  Resp: 18  Temp: (!) 97.2 F (36.2 C)  SpO2: 100%   Filed Weights   04/09/22 0952  Weight: 133 lb (60.3 kg)    BREAST: No palpable masses or nodules in either right or left breasts. No palpable axillary supraclavicular or infraclavicular adenopathy no breast tenderness or nipple discharge. (exam performed  in the presence of a chaperone)  LABORATORY DATA:  I have reviewed the data as listed    Latest Ref Rng & Units 10/23/2021   11:30 AM 07/07/2021    4:17 PM 06/30/2021    4:52 PM  CMP  Glucose 70 - 99 mg/dL 78  148  99   BUN 8 - 23 mg/dL 15  19  16    Creatinine 0.44 - 1.00 mg/dL 0.66  0.86  0.71   Sodium 135 - 145 mmol/L 141  145  139   Potassium 3.5 - 5.1 mmol/L 4.2  4.3  4.0   Chloride 98 - 111 mmol/L 110  108  108  CO2 22 - 32 mmol/L 24  23  22    Calcium 8.9 - 10.3 mg/dL 9.6  10.4  9.3   Total Protein 6.1 - 8.1 g/dL  6.5    Total Bilirubin 0.2 - 1.2 mg/dL  0.3    AST 10 - 35 U/L  10    ALT 6 - 29 U/L  6      Lab Results  Component Value Date   WBC 12.6 (H) 07/21/2021   HGB 12.0 07/21/2021   HCT 38.1 07/21/2021   MCV 90.2 07/21/2021   PLT 467.0 (H) 07/21/2021   NEUTROABS 6.6 07/21/2021    ASSESSMENT & PLAN:  Malignant neoplasm of upper-outer quadrant of right breast in female, estrogen receptor positive ([place]) Routine screening mammogram detected a 1.3cm mass in the right breast, 9 o'clock position, no axillary adenopathy. 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  T1c N0 stage Ia   Recommendations: 1. 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 Adj chemo with Taxol-Herceptin foll by Herceptin maintenance for 1 year started 05/13/2019, discontinued February 2021 for low ejection fraction 3. Adjuvant radiation therapy 08/20/2019-09/14/2019 4. Adjuvant antiestrogen therapy with anastrozole 1 mg daily started April 2021   Enrolled in SWOG 1714 trials.  ------------------------------------------------------------------------------------------------------------------------- Current treatment: Anastrozole 1 mg daily started April 2021 Echo EF 55 to 60% 04/15/2019 EF 30 to 35% on 07/20/2019 EF 45%  10/10/2020   Anastrozole toxicities: Mild hot flashes and joint stiffness which she attributes to aging.   Breast cancer surveillance: 1.  Breast exam 04/09/2022: Benign, slight tenderness in the right axilla 2. mammogram 07/06/2021: Benign breast density category B Brain MRI 09/09/2020: Benign   Bone density 05/31/2020: T score -1.1: (Mild osteopenia): Continue with calcium and vitamin D   Return to clinic in 1 year for follow-up      No orders of the defined types were placed in this encounter.  The patient has a good understanding of the overall plan. she agrees with it. she will call with any problems that may develop before the next visit here. Total time spent: 30 mins including face to face time and time spent for planning, charting and co-ordination of care   Harriette Ohara, MD 04/09/22    I Gardiner Coins am scribing for Dr. Lindi Adie  I have reviewed the above documentation for accuracy and completeness, and I agree with the above.

## 2022-04-09 ENCOUNTER — Inpatient Hospital Stay: Payer: HMO | Attending: Hematology and Oncology | Admitting: Hematology and Oncology

## 2022-04-09 DIAGNOSIS — C50411 Malignant neoplasm of upper-outer quadrant of right female breast: Secondary | ICD-10-CM | POA: Insufficient documentation

## 2022-04-09 DIAGNOSIS — Z170 Estrogen receptor positive status [ER+]: Secondary | ICD-10-CM | POA: Insufficient documentation

## 2022-04-09 DIAGNOSIS — Z79811 Long term (current) use of aromatase inhibitors: Secondary | ICD-10-CM | POA: Insufficient documentation

## 2022-04-09 MED ORDER — ANASTROZOLE 1 MG PO TABS: 1.0000 mg | ORAL_TABLET | Freq: Every day | ORAL | 3 refills | Status: DC

## 2022-04-09 NOTE — Assessment & Plan Note (Addendum)
Routine screening mammogram detected a 1.3cm mass in the right breast, 9 o'clock position, no axillary adenopathy. 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 T1c N0 stage Ia  Recommendations: 1. 04/15/19: Breast conserving surgery: Right lumpectomy [name]): IDC, grade 2, 1.3cm, with intermediate grade DCIS, clear margins, and 1 right axillary lymph node negative 2.Adj chemo with Taxol-Herceptin foll by Herceptin maintenance for 1 yearstarted 05/13/2019, discontinued February 2021 for low ejection fraction 3. Adjuvant radiation therapy2/25/2021-09/14/2019 4. Adjuvant antiestrogen therapywith anastrozole 1 mg daily started April 2021  Enrolled inSWOG 1714 trials.  ------------------------------------------------------------------------------------------------------------------------- Current treatment: Anastrozole 1 mg daily started April 2021 EchoEF 55 to 60%04/15/2019 EF 30 to 35% on 07/20/2019 EF 45% 10/10/2020  Anastrozole toxicities: Mild hot flashes and joint stiffness which she attributes to aging.  Breast cancer surveillance: 1.  Breast exam 04/09/2022: Benign, slight tenderness in the right axilla 2. mammogram 07/06/2021: Benign breast density category B Brain MRI 09/09/2020: Benign   Bone density 05/31/2020: T score -1.1: (Mild osteopenia): Continue with calcium and vitamin D  Return to clinic in 1 year for follow-up

## 2022-04-11 ENCOUNTER — Ambulatory Visit (INDEPENDENT_AMBULATORY_CARE_PROVIDER_SITE_OTHER): Payer: HMO

## 2022-04-11 DIAGNOSIS — Z23 Encounter for immunization: Secondary | ICD-10-CM | POA: Diagnosis not present

## 2022-05-01 ENCOUNTER — Other Ambulatory Visit: Payer: Self-pay | Admitting: Family

## 2022-05-01 ENCOUNTER — Encounter (HOSPITAL_COMMUNITY): Payer: Self-pay | Admitting: Cardiology

## 2022-05-03 ENCOUNTER — Encounter (HOSPITAL_COMMUNITY): Payer: Self-pay | Admitting: Cardiology

## 2022-05-03 ENCOUNTER — Ambulatory Visit (HOSPITAL_COMMUNITY)
Admission: RE | Admit: 2022-05-03 | Discharge: 2022-05-03 | Disposition: A | Discharge status: Home / Self Care | Payer: HMO | Source: Ambulatory Visit | Attending: Cardiology | Admitting: Cardiology

## 2022-05-03 DIAGNOSIS — I447 Left bundle-branch block, unspecified: Secondary | ICD-10-CM | POA: Diagnosis not present

## 2022-05-03 DIAGNOSIS — I5022 Chronic systolic (congestive) heart failure: Secondary | ICD-10-CM | POA: Insufficient documentation

## 2022-05-03 DIAGNOSIS — Z7984 Long term (current) use of oral hypoglycemic drugs: Secondary | ICD-10-CM | POA: Diagnosis not present

## 2022-05-03 DIAGNOSIS — E119 Type 2 diabetes mellitus without complications: Secondary | ICD-10-CM | POA: Diagnosis not present

## 2022-05-03 DIAGNOSIS — C50919 Malignant neoplasm of unspecified site of unspecified female breast: Secondary | ICD-10-CM | POA: Insufficient documentation

## 2022-05-03 DIAGNOSIS — I428 Other cardiomyopathies: Secondary | ICD-10-CM | POA: Insufficient documentation

## 2022-05-03 DIAGNOSIS — I110 Hypertensive heart disease with heart failure: Secondary | ICD-10-CM | POA: Insufficient documentation

## 2022-05-03 DIAGNOSIS — Z79899 Other long term (current) drug therapy: Secondary | ICD-10-CM | POA: Diagnosis not present

## 2022-05-03 LAB — CBC
HCT: 37.8 % (ref 36.0–46.0)
Hemoglobin: 12.2 g/dL (ref 12.0–15.0)
MCH: 29.3 pg (ref 26.0–34.0)
MCHC: 32.3 g/dL (ref 30.0–36.0)
MCV: 90.6 fL (ref 80.0–100.0)
Platelets: 453 10*3/uL — ABNORMAL HIGH (ref 150–400)
RBC: 4.17 MIL/uL (ref 3.87–5.11)
RDW: 14.6 % (ref 11.5–15.5)
WBC: 14.9 10*3/uL — ABNORMAL HIGH (ref 4.0–10.5)
nRBC: 0 % (ref 0.0–0.2)

## 2022-05-03 LAB — BASIC METABOLIC PANEL
Anion gap: 9 (ref 5–15)
BUN: 14 mg/dL (ref 8–23)
CO2: 23 mmol/L (ref 22–32)
Calcium: 9.3 mg/dL (ref 8.9–10.3)
Chloride: 109 mmol/L (ref 98–111)
Creatinine, Ser: 1 mg/dL (ref 0.44–1.00)
GFR, Estimated: >60 mL/min (ref >60)
Glucose, Bld: 135 mg/dL — ABNORMAL HIGH (ref 70–99)
Potassium: 4.2 mmol/L (ref 3.5–5.1)
Sodium: 141 mmol/L (ref 135–145)

## 2022-05-03 LAB — TSH: TSH: 1.087 u[IU]/mL (ref 0.350–4.500)

## 2022-05-03 MED ORDER — SPIRONOLACTONE 25 MG PO TABS
25.0000 mg | ORAL_TABLET | Freq: Every evening | ORAL | 3 refills | Status: DC
Start: 2022-05-03 — End: 2022-12-03

## 2022-05-03 NOTE — Patient Instructions (Signed)
Change Spironolactone to nightly.  Labs done today, your results will be available in MyChart, we will contact you for abnormal readings.  Your physician recommends that you schedule a follow-up appointment in: 4 months (March 2024)  ** please call the office in January to arrange your follow up appointment **  If you have any questions or concerns before your next appointment please send [place] a message through [place] or call our office at [phone].    TO LEAVE A MESSAGE FOR THE NURSE SELECT OPTION 2, PLEASE LEAVE A MESSAGE INCLUDING: YOUR NAME DATE OF BIRTH CALL BACK NUMBER REASON FOR CALL**this is important as we prioritize the call backs  YOU WILL RECEIVE A CALL BACK THE SAME DAY AS LONG AS YOU CALL BEFORE 4:00 PM  At the [facility], you and your health needs are our priority. As part of our continuing mission to provide you with exceptional heart care, we have created designated Provider Care Teams. These Care Teams include your primary Cardiologist (physician) and Advanced Practice Providers (APPs- Physician Assistants and Nurse Practitioners) who all work together to provide you with the care you need, when you need it.   You may see any of the following providers on your designated Care Team at your next follow up: Dr Glori Bickers Dr Loralie Champagne Dr. Roxana Hires, NP Lyda Jester, [place] [facility] [place], [place] Forestine Na, NP Audry Riles, PharmD   Please be sure to bring in all your medications bottles to every appointment.

## 2022-05-03 NOTE — Progress Notes (Signed)
PCP: [name], FNP Oncology: Dr. Lindi Adie HF cardiology: Dr. Aundra Dubin   71 y.o. with history of DM, chronic LBBB, and breast cancer was referred by Dr. Lindi Adie for workup of fall in EF.  She was diagnosed with right breast cancer, ER+/PR+/HER2+ in 9/20.  She had right lumpectomy in 10/20.  Starting in 11/20, she had been getting 12 cycles of Taxol/Herceptin to be followed by Herceptin alone to complete a year. Echo in 10/20 showed EF 55-60% per report (looked more like 50% on my review, likely due to septal-lateral dyssynchrony).  Her repeat echo for Herceptin screening was done in 1/21, showing EF down to 30-35% with septal-lateral dyssynchrony.  Herceptin has been stopped for now.  She is currently undergoing radiation therapy.   Echo in 3/21 showed that EF remains 30-35% with prominent septal-lateral dyssynchrony.  Patient has a strong family history of heart problems.  Father died from "ruptured aorta."  Sister had heart valve repair, one brother has a pacemaker, and another brother has AL amyloidosis.   LHC/RHC was done in 4/21. This showed normal filling pressures, preserved cardiac output, and no significant CAD. Cardiac MRI in 5/21 showed EF 36% with septal-lateral dyssynchrony, RV EF normal 45%, no LGE.  Echo in 10/21 showed EF 40-45% with septal-lateral dyssynchrony.   Echo in 4/22 showed EF 45% with septal-lateral dyssynchrony, normal RV.  Echo in 4/23 showed EF 45% with septal-lateral dyssynchrony, normal RV, IVC normal.   She returns today for followup of cardiomyopathy.  She is stable symptomatically.  No dyspnea walking on flat ground. No problems walking up a flight of stairs.  Limited by knee pain mainly.  No chest pain. Weight down 13 lbs from last appointment. She still gets fatigued and has some lightheadedness if she stands too fast.   Labs (1/21): K 3.8, creatinine 0.75 Labs (3/21): K 4.5, creatinine 0.71 Labs (4/21): K 4.3, creatinine 0.82 => 0.83 Labs (8/21): hgb  12.7, K 4.7, creatinine 0.85 Labs (10/21): K 4.2, creatinine 0.92 Labs (1/22): LDL 37, HDL 47, K 4.3, creatinine 0.77 Labs (1/23): K 4.3, creatinine 0.86, hgb 12, LDL 47 Labs (5/23): K 4.2, creatinine 0.66  ECG (personally reviewed): NSR, LBBB 126 msec  PMH: 1. Depression 2. Type 2 diabetes 3. Chronic LBBB 4. H/o transient complete heart block: in 10/20 when she was having port-a-cath placed.  5. HTN 6. Hyperlipidemia.  7. Breast cancer: She was diagnosed with right breast cancer, ER+/PR+/HER2+ in 9/20.  She had right lumpectomy in 10/20.  Starting in 11/20, she has been getting 12 cycles of Taxol/Herceptin to be followed by Herceptin alone to complete a year.  8. Chronic systolic CHF: She has had mildly decreased EF in the past, possibly related to LBBB.  Nonischemic cardiomyopathy.  - Echo (4/17): EF 50-55% - Cardiolite (4/17): EF 48% - Echo (10/20): EF 55-60% (reviewed, think more like 50% but technically difficult).  - Echo (1/21): EF 30-35%, mild LVH, septal dyssynchrony.  - Echo (3/21): EF 30-35% with prominent septal-lateral dyssynchrony.  - LHC/RHC (4/21): mean RA 1, PA 13/3, mean PCWP 2, CI 3.74, no significant CAD.  - Cardiac MRI (5/21): EF 36% with septal-lateral dyssynchrony, RV EF normal 45%, no LGE.  - Echo (10/21): EF 40-45%, septal-lateral dyssynchrony.  - Echo (4/22): EF 45% with septal-lateral dyssynchrony, normal RV. - Echo (4/23): EF 45% with septal-lateral dyssynchrony, normal RV, IVC normal.   Social History   Socioeconomic History   Marital status: Married    Spouse name: [name]  Number of children: 2   Years of education: Not on file   Highest education level: Not on file  Occupational History   Occupation: CSR    Employer: [employer]   Occupation: retired  Tobacco Use   Smoking status: Never   Smokeless tobacco: Never  Vaping Use   Vaping Use: Never used  Substance and Sexual Activity   Alcohol use: No   Drug use: No   Sexual  activity: Not Currently  Other Topics Concern   Not on file  Social History Narrative   Regular Exercise -  NO   Social Determinants of Health   Financial Resource Strain: Low Risk  (01/28/2020)   Overall Financial Resource Strain (CARDIA)    Difficulty of Paying Living Expenses: Not hard at all  Food Insecurity: No Food Insecurity (01/28/2020)   Hunger Vital Sign    Worried About Running Out of Food in the Last Year: Never true    [place] in the Last Year: Never true  Transportation Needs: No Transportation Needs (01/28/2020)   PRAPARE - Hydrologist (Medical): No    Lack of Transportation (Non-Medical): No  Physical Activity: Sufficiently Active (01/28/2020)   Exercise Vital Sign    Days of Exercise per Week: 5 days    Minutes of Exercise per Session: 30 min  Stress: No Stress Concern Present (12/13/2021)   [place] of Stress : Not at all  Social Connections: [place] (01/28/2020)   Social Connection and Isolation Panel [NHANES]    Frequency of Communication with Friends and Family: More than three times a week    Frequency of Social Gatherings with Friends and Family: More than three times a week    Attends Religious Services: More than 4 times per year    Active Member of [employer] or Organizations: Yes    Attends Music therapist: More than 4 times per year    Marital Status: Married  Human resources officer Violence: Not At Risk (01/28/2020)   Humiliation, Afraid, Rape, and Kick questionnaire    Fear of Current or Ex-Partner: No    Emotionally Abused: No    Physically Abused: No    Sexually Abused: No   Family History  Problem Relation Age of Onset   Liver disease Mother    Dementia Mother    Diabetes Mother        borderline   Coronary artery disease Father    Heart attack Father    Hypertension Father    Heart disease Father    Cancer Other         leukemia   Stroke Maternal Grandfather    Hyperlipidemia Brother        Amyloidosis   Diabetes Maternal Grandmother    Cancer Paternal Uncle        unknown   Heart attack Paternal Grandmother    Heart attack Paternal Uncle    Hypertension Brother    Diabetes Daughter        borderline   Colon cancer Neg Hx    Esophageal cancer Neg Hx    Rectal cancer Neg Hx    Stomach cancer Neg Hx    Current Outpatient Medications  Medication Sig Dispense Refill   acetaminophen (TYLENOL) 500 MG tablet Take 1,000 mg by mouth every 6 (six) hours as needed for moderate pain.     anastrozole (ARIMIDEX) 1 MG  tablet Take 1 tablet (1 mg total) by mouth daily. 90 tablet 3   aspirin 81 MG EC tablet Take 81 mg by mouth daily.     benzonatate (TESSALON) 100 MG capsule Take 1 capsule (100 mg total) by mouth 3 (three) times daily as needed. 20 capsule 0   Blood Glucose Monitoring Suppl (ONETOUCH VERIO FLEX SYSTEM) W/DEVICE KIT 1 kit by Does not apply route 3 (three) times daily. 1 kit 1   buPROPion (WELLBUTRIN XL) 150 MG 24 hr tablet TAKE 1 TABLET BY MOUTH DAILY IN THE MORNING 90 tablet 0   carvedilol (COREG) 6.25 MG tablet Take 1 tablet (6.25 mg total) by mouth 2 (two) times daily. 180 tablet 3   Cholecalciferol (VITAMIN D) 50 MCG (2000 UT) tablet Take 2,000 Units by mouth daily.     Continuous Blood Gluc Receiver (FREESTYLE LIBRE 2 READER) DEVI 1 each by Does not apply route daily. 6 each 3   Continuous Blood Gluc Sensor (FREESTYLE LIBRE 2 SENSOR) MISC APPLY AS DIRECTED EVERY 14 DAYS     fluticasone (FLONASE) 50 MCG/ACT nasal spray Place 2 sprays into both nostrils daily. 16 g 1   glucose blood (ONETOUCH VERIO) test strip Test three times daily as directed. DX: E11.9 300 each 3   HYDROcodone bit-homatropine (HYCODAN) 5-1.5 MG/5ML syrup Take 5 mLs by mouth every 8 (eight) hours as needed for cough. 120 mL 0   Insulin Pen Needle 32G X 4 MM MISC 1 each by Other route daily. E11.9; THIS IS THE CORRECT PEN  NEEDLE. PLEASE FILL RX AS ORDERED 100 each 3   JARDIANCE 25 MG TABS tablet TAKE 1 TABLET BY MOUTH DAILY 90 tablet 3   LANTUS SOLOSTAR 100 UNIT/ML Solostar Pen INJECT SUBCUTANEOUSLY 26  UNITS AT BEDTIME 30 mL 3   meclizine (ANTIVERT) 25 MG tablet Take 25 mg by mouth 3 (three) times daily as needed for dizziness.     metFORMIN (GLUCOPHAGE) 1000 MG tablet Take 1 tablet (1,000 mg total) by mouth 2 (two) times daily with a meal. 180 tablet 3   omeprazole (PRILOSEC) 40 MG capsule TAKE 1 CAPSULE BY MOUTH 30-60 MINUTES PRIOR TO BREAKFAST AND DINNER 60 capsule 3   OneTouch Delica Lancets 65H MISC 1 each by Does not apply route daily. E11.9 100 each 0   Probiotic Product (PROBIOTIC ADVANCED PO) Take 1 tablet by mouth at bedtime.      rosuvastatin (CRESTOR) 10 MG tablet TAKE 1 TABLET BY MOUTH DAILY 30 tablet 11   sacubitril-valsartan (ENTRESTO) 24-26 MG Take 1 tablet by mouth 2 (two) times daily. 60 tablet 11   sertraline (ZOLOFT) 100 MG tablet TAKE 1 TABLET BY MOUTH DAILY 90 tablet 3   TRULICITY 3 QI/6.9GE SOPN INJECT 3 MG INTO THE SKIN  WEEKLY 6 mL 1   vitamin B-12 (CYANOCOBALAMIN) 100 MCG tablet Take 100 mcg by mouth daily.     Vitamin E 180 MG CAPS Take 180 mg by mouth at bedtime.      spironolactone (ALDACTONE) 25 MG tablet Take 1 tablet (25 mg total) by mouth at bedtime. 90 tablet 3   No current facility-administered medications for this encounter.   BP 108/74   Pulse 81   Wt 56.5 kg (124 lb 9.6 oz)   SpO2 97%   BMI 22.79 kg/m  General: NAD Neck: No JVD, no thyromegaly or thyroid nodule.  Lungs: Clear to auscultation bilaterally with normal respiratory effort. CV: Nondisplaced PMI.  Heart regular S1/S2, no S3/S4, no murmur.  No peripheral edema.  No carotid bruit.  Normal pedal pulses.  Abdomen: Soft, nontender, no hepatosplenomegaly, no distention.  Skin: Intact without lesions or rashes.  Neurologic: Alert and oriented x 3.  Psych: Normal affect. Extremities: No clubbing or cyanosis.   HEENT: Normal.   Assessment/Plan: 1. Chronic systolic CHF:  Nonischemic cardiomyopathy.  Echo in 1/21 showed EF down to 30-35% with septal-lateral dyssynchrony.  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  Based on the timeline, the most likely cause for fall in EF seems to be Herceptin use.  She is now off Herceptin, but echo in 3/21 showed EF still in the 30-35% range.  LHC/RHC in 4/21 showed low filling pressures, preserved cardiac output, and no significant CAD.  Cardiac MRI in 5/21 showed EF 36%, no LGE (no evidence for infiltrative disease). Of note, brother died of sudden death so also consider familial cardiomyopathy.  Echo in 10/21 showed EF up a bit to 40-45% with dyssynchrony due to LBBB.  Echo in 4/22 showed EF 45%, septal-lateral dyssynchrony. Echo in 4/23 showed EF 45% with septal-lateral dyssynchrony.  NYHA class II symptoms.  She is not volume overloaded on exam. Orthostatic symptoms improved on lower doses of Entresto and Coreg.  - Continue lower doses of Entresto at 24/26 bid and Coreg at 6.25 mg bid.  - Continue spironolactone 25 mg daily, take qhs.  BMET today.  - Continue Jardiance.  - She will not be getting any more Herceptin.   - EF is now out of range for CRT-D.  - Increase fluid intake with orthostatic symptoms (manageable now).  2. 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  - Follow closely with use of Coreg.   3. LBBB: Chronic.  ?LBBB cardiomyopathy, though QRS today was not markedly prolonged today (126 msec).   4. Breast cancer: She has completed Herceptin.   Followup in 6 months with APP.    Margaret Norman 05/03/2022

## 2022-05-09 ENCOUNTER — Telehealth: Payer: Self-pay

## 2022-05-09 DIAGNOSIS — Z170 Estrogen receptor positive status [ER+]: Secondary | ICD-10-CM

## 2022-05-09 NOTE — Telephone Encounter (Signed)
Research nurse called patient to complete her 156 week follow up visit questionnaires over the telephone for the study. The patient stated she was driving and will call me back when she is able. She confirmed she had the research office phone number.  Jeral Fruit, RN 05/09/22 1:46 PM

## 2022-05-10 ENCOUNTER — Telehealth: Payer: Self-pay

## 2022-05-10 NOTE — Telephone Encounter (Signed)
S1714 - A Prospective Observational Cohort Study to Develop a Predictive Model of Taxane-Induced Peripheral Neuropathy in Cancer Patients   Research nurse called patient back today to review her questionnaires for the SWOG S1714 protocol study. Patient states she forgot to call back yesterday. The Solicited Neuropathy Events form was reviewed with the patient for which she denied any dysesthesia, neuralgia, paresthesia, peripheral motor neuropathy, and peripheral sensory neuropathy. The EORTC [id number], PRO-CTCAE and FACT/GOG-NTX questionnaires were reviewed with the patient over the telephone and completed per protocol with all questions answered. The patient is aware this is her last visit for the S1714 peripheral neuropathy study. She is excited she is done she states. Research nurse thanked her for her time and participation in the research program here at [facility]. Encouraged to call if she has any questions or concerns in the future.  Jeral Fruit, RN 05/10/22 2:16 PM

## 2022-05-21 ENCOUNTER — Other Ambulatory Visit: Payer: Self-pay | Admitting: Internal Medicine

## 2022-05-22 ENCOUNTER — Other Ambulatory Visit: Payer: Self-pay | Admitting: Internal Medicine

## 2022-05-23 ENCOUNTER — Encounter: Payer: Self-pay | Admitting: Internal Medicine

## 2022-05-23 ENCOUNTER — Ambulatory Visit (INDEPENDENT_AMBULATORY_CARE_PROVIDER_SITE_OTHER): Payer: HMO | Admitting: Internal Medicine

## 2022-05-23 VITALS — BP 130/80 | HR 91 | Ht 62.0 in | Wt 134.8 lb

## 2022-05-23 DIAGNOSIS — E785 Hyperlipidemia, unspecified: Secondary | ICD-10-CM | POA: Diagnosis not present

## 2022-05-23 DIAGNOSIS — Z794 Long term (current) use of insulin: Secondary | ICD-10-CM

## 2022-05-23 DIAGNOSIS — E042 Nontoxic multinodular goiter: Secondary | ICD-10-CM | POA: Diagnosis not present

## 2022-05-23 DIAGNOSIS — E1165 Type 2 diabetes mellitus with hyperglycemia: Secondary | ICD-10-CM

## 2022-05-23 LAB — POCT GLYCOSYLATED HEMOGLOBIN (HGB A1C): Hemoglobin A1C: 6.5 % — AB (ref 4.0–5.6)

## 2022-05-23 NOTE — Patient Instructions (Addendum)
Please continue: - Metformin 2000 mg with dinner - Jardiance 25 mg daily before breakfast - Trulicity 3 mg weekly  Please decrease: - Lantus 22 units at night   Please return in 4 months.

## 2022-05-23 NOTE — Progress Notes (Signed)
Patient ID: Margaret Norman, female   DOB: September 01, 1950, 71 y.o.   MRN: [id number]   HPI: Margaret Norman is a 71 y.o.-year-old female, returning for follow-up for DM2, dx in 2008, insulin-dependent since 2016, uncontrolled, without long term complications.  Last visit  4 months ago.  Interim history:  No increased urination, blurry vision, chest pain.  She has a history of vertigo.  She still has some dizziness/lightheadedness.  Reviewed HbA1c levels: Lab Results  Component Value Date   HGBA1C 6.2 (A) 01/17/2022   HGBA1C 6.7 (A) 09/15/2021   HGBA1C 7.1 (A) 05/17/2021  03/19/2017: HbA1c calculated from fructosamine is 6.7%! 12/31/2016: HbA1c calculated from fructosamine: 6.9%!  Pt is on: - Metformin 1000 mg 2x a day with meals >> 2000 mg with dinner - Jardiance 25 mg daily before breakfast (PAP) - Trulicity 1.5 >> 3 mg weekly (PAP) - Lantus 30 >> 26 units at night Stopped Actos when started trulicity back. She was on Trulicity but became expensive >> changed to Actos. She was on Invokana >> recurrent UTIs.  Pt checks her sugars >4x a day:  Previously:   Previously:   Lowest sugar was 60s x1 >> 83 >> 74 >> 66 >> 58; she has hypoglycemia awareness in the 70s. Highest sugar was 177 >> 199 >> 200s >> 200.  Glucometer: OneTouch Verio  -No CKD, last BUN/creatinine:  Lab Results  Component Value Date   BUN 14 05/03/2022   BUN 15 10/23/2021   CREATININE 1.00 05/03/2022   CREATININE 0.66 10/23/2021  On Entresto.  -+ HL; last set of lipids: Lab Results  Component Value Date   CHOL 109 07/24/2021   HDL 46 07/24/2021   LDLCALC 47 07/24/2021   LDLDIRECT 137.9 02/04/2013   TRIG 78 07/24/2021   CHOLHDL 2.4 07/24/2021  On Crestor 10.  - last eye exam was in 10/26/2021: No DR, + cataract OS.  Dr. Marica Otter.   -+ numbness and tingling in her toes.  She has restless leg syndrome.  Last foot exam 09/15/2021.  Pt has FH of DM in MGM.  Multinodular goiter:   -Detected on  palpation in 2021  Thyroid ultrasound (01/26/2020): Parenchymal Echotexture: Moderately heterogenous Isthmus: 0.3 cm Right lobe: 6.2 cm x 2.1 cm x 2.2 cm Left lobe: 7.7 cm x 3.2 cm x 2.8 cm _________________________________________________________   Nodule # 1: Location: Right; Superior Maximum size: 0.9 cm; Other 2 dimensions: 0.7 cm x 0.7 cm Composition: spongiform (0) Spongiform nodule does not meet criteria for surveillance or biopsy  _________________________________________________________   Nodule # 2: Location: Right; Mid Maximum size: 1.0 cm; Other 2 dimensions: 0.9 cm x 0.6 cm Composition: spongiform (0) Spongiform nodule does not meet criteria for surveillance or biopsy  _________________________________________________________   Nodule # 3:  Location: Right; Mid Maximum size: 1.2 cm; Other 2 dimensions: 1.1 cm x 0.7 cm Composition: spongiform (0) Spongiform nodule does not meet criteria for surveillance or biopsy _________________________________________________________   Nodule # 4:  Location: Right; Inferior Maximum size: 1.0 cm; Other 2 dimensions: 0.8 cm x 0.8 cm Composition: spongiform (0) Spongiform nodule does not meet criteria for surveillance or biopsy  ________________________________________________________   Nodule # 5:  Location: Right; Inferior Maximum size: 1.5 cm; Other 2 dimensions: 1.4 cm x 0.8 cm Composition: solid/almost completely solid (2) Echogenicity: isoechoic (1) Nodule meets criteria for surveillance  _________________________________________________________   Nodule # 6: Location: Left; Superior Maximum size: 1.2 cm; Other 2 dimensions: 1.1 cm x 0.9 cm  Composition:  cannot determine (2) Echogenicity: isoechoic (1) Echogenic foci: macrocalcifications (1)  Nodule meets criteria for surveillance  _______________________________________________________   Nodule # 7: Location: Left; Inferior Maximum size: 4.7 cm; Other 2  dimensions: 4.7 cm x 2.8 cm Composition: mixed cystic and solid (1) Echogenicity: isoechoic (1) Echogenic foci: macrocalcifications (1) Nodule meets criteria for biopsy  ______________________________________________________   No adenopathy   IMPRESSION: Multinodular thyroid.   Left inferior thyroid nodule (labeled 7, TR , 4.7 cm) meets criteria for biopsy, as designated by the newly established ACR TI-RADS criteria, and referral for biopsy is recommended.   Right inferior thyroid nodule (labeled 5, 1.5 cm, TR 3) and the left superior thyroid nodule (labeled 6, 1.2 cm, TR 4) both meet criteria for surveillance, as designated by the newly established ACR TI-RADS criteria. Surveillance ultrasound study recommended to be performed annually up to 5 years.   FNA left inferior thyroid nodule (02/16/2020): Benign  Thyroid U/S (02/07/2021): Parenchymal Echotexture: Markedly heterogenous Isthmus: 0.6 cm Right lobe: 7.4 cm x 2.0 cm x 2.5 cm Left lobe: 8.4 cm x 3.2 cm x 4.3 cm _________________________________________________________   Estimated total number of nodules >/= 1 cm: 6-10  _________________________________________________________   Nodule labeled 1 superior right thyroid, slightly smaller than previous and spongiform. Nodule does not meet criteria for surveillance or biopsy   Nodule labeled 2, right thyroid, TR 2 with cystic components measures less than 1 cm. Nodule does not meet criteria for surveillance or biopsy   Nodule labeled 3, mid right thyroid. Spongiform characteristics measuring 1.3 cm. Nodule does not meet criteria for surveillance or biopsy.   Nodule labeled 4, mid right thyroid, 9 mm, spongiform characteristics. Nodule does not meet criteria for surveillance or biopsy.   Nodule labeled 5, 0.98 cm, spongiform characteristics and does not meet criteria for surveillance or biopsy.   Nodule 6, right lower thyroid, 0.96 cm. Nodule has  spongiform characteristics and does not meet criteria for surveillance or biopsy.   Nodule 7, right lower thyroid, smaller than previous, now 1.0 cm. Previous nodule was labeled 5, 1.5 cm. Nodule has clearly spongiform characteristics on the current ultrasound, and has decreased in size below 1 cm. Nodule no longer meets criteria for surveillance.   Nodule labeled 8 on the left, upper left thyroid, 9 mm. Nodule has internal cystic change on the current, TR 2 characteristics, and has decreased in size below 1 cm. Nodule no longer meets criteria for continued surveillance.   The nodules at the left inferior thyroid labeled 9, 10, 11, appear to all be part of the previous 4.7 cm nodule which was previously biopsied 02/16/2020, and are doubtful to represent independent nodules given the available images and the comparison.   No adenopathy   Recommendations follow those established by the new ACR TI-RADS criteria (J Am Coll Radiol 2017;14:587-595).   IMPRESSION: Enlarged multinodular thyroid, as above.   Nodule inferior left thyroid has been previously biopsied, as above. Assuming benign result, no further specific follow-up would be indicated.  Pt denies: - feeling nodules in neck - hoarseness - dysphagia - choking  Latest TSH was normal Lab Results  Component Value Date   TSH 1.087 05/03/2022   She sees Cardiology - Dr. Aundra Dubin >> on [place], [place]. She also has a history of breast cancer, s/p chemoradiation therapy. She fractured humerus in 02/2020 after tripping >> this is healing.  She had a DXA scan >> normal.  She is on Arimidex.  She had lightheadedness and dizziness at last visit.  She was in the emergency room with vertigo 06/30/2021.  ROS: + See HPI  I reviewed pt's medications, allergies, PMH, social hx, family hx, and changes were documented in the history of present illness. Otherwise, unchanged from my initial visit note.  Past Medical History:  Diagnosis  Date   Arthritis    Chest pain    Nuclear, adenosine,  December, 2013, low risk nuclear scan with small, moderate in intensity, fixed anteroseptal defect. This is possibly related to an LBBB versus small prior infarct. 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 w/appy   CHOLECYSTECTOMY     ESOPHAGOGASTRODUODENOSCOPY     HERNIA REPAIR     KNEE ARTHROSCOPY Right 11/2005   LIVER BIOPSY     PANCREATIC CYST EXCISION     PORT-A-CATH REMOVAL Left 04/25/2020   Procedure: REMOVAL PORT-A-CATH;  Surgeon: Coralie Keens, MD;  Location: [facility];  Service: General;  Laterality: Left;   PORTACATH PLACEMENT Left 04/15/2019   Procedure: INSERTION PORT-A-CATH WITH ULTRASOUND;  Surgeon: Coralie Keens, MD;  Location: [facility];  Service: General;  Laterality: Left;   RIGHT/LEFT HEART CATH AND CORONARY ANGIOGRAPHY N/A 09/28/2019   Procedure: RIGHT/LEFT HEART CATH AND CORONARY ANGIOGRAPHY;  Surgeon: Larey Dresser, MD;  Location: [facility];  Service: Cardiovascular;  Laterality: N/A;   SPLENECTOMY     TONSILLECTOMY AND  ADENOIDECTOMY     Social History   Socioeconomic History   Marital status: Married    Spouse name: Doristine Shehan   Number of children: 2   Years of education: Not on file   Highest education level: Not on file  Occupational History   Occupation: CSR    Employer: [employer]   Occupation: retired  Tobacco Use   Smoking status: Never   Smokeless tobacco: Never  Vaping Use   Vaping Use: Never used  Substance and Sexual Activity   Alcohol use: No   Drug use: No   Sexual activity: Not Currently  Other Topics Concern   Not on file  Social History Narrative   Regular Exercise -  NO   Social Determinants of Health   Financial Resource Strain: Low Risk  (01/28/2020)   Overall Financial Resource Strain (CARDIA)    Difficulty of Paying Living Expenses: Not hard at all  Food Insecurity: No Food Insecurity (01/28/2020)   Hunger Vital Sign    Worried About Running Out of Food in the Last Year: Never true    [place] in the Last Year: Never true  Transportation Needs: No Transportation Needs (01/28/2020)   PRAPARE - Hydrologist (Medical): No    Lack of Transportation (Non-Medical): No  Physical Activity: Sufficiently Active (01/28/2020)   Exercise Vital Sign    Days of Exercise per Week: 5 days    Minutes of Exercise per Session: 30 min  Stress: No Stress Concern Present (12/13/2021)   [place]    Feeling of Stress : Not at all  Social Connections: [place] (01/28/2020)   Social Connection and Isolation Panel [NHANES]    Frequency of Communication with Friends and Family: More than three times a week    Frequency of Social Gatherings with Friends and Family: More than three times a week    Attends Religious Services: More than 4 times per year    Active Member of [employer] or Organizations: Yes    Attends Music therapist: More than 4 times per year     Marital Status: Married  Human resources officer Violence: Not At Risk (01/28/2020)   Humiliation, Afraid, Rape, and Kick questionnaire    Fear of Current or Ex-Partner: No    Emotionally Abused: No    Physically Abused: No    Sexually Abused: No   Current Outpatient Medications on File Prior to Visit  Medication Sig Dispense Refill   acetaminophen (TYLENOL) 500 MG tablet Take 1,000 mg by mouth every 6 (six) hours as needed for moderate pain.     anastrozole (ARIMIDEX) 1 MG tablet Take 1 tablet (1 mg total) by mouth daily. 90 tablet 3   aspirin 81 MG EC tablet Take 81 mg by mouth daily.     benzonatate (TESSALON) 100 MG capsule Take 1 capsule (100 mg total) by mouth 3 (three) times daily as needed. 20 capsule 0   Blood Glucose Monitoring Suppl (ONETOUCH VERIO FLEX SYSTEM) W/DEVICE KIT 1 kit by Does not apply route 3 (three) times daily. 1 kit 1   buPROPion (WELLBUTRIN XL) 150 MG 24 hr tablet TAKE 1 TABLET BY MOUTH DAILY IN THE MORNING 90 tablet 0   carvedilol (COREG) 6.25 MG tablet Take 1 tablet (6.25 mg total) by mouth 2 (two) times daily. 180 tablet 3   Cholecalciferol (VITAMIN D) 50 MCG (2000 UT) tablet Take 2,000 Units by mouth daily.     Continuous Blood Gluc Receiver (FREESTYLE LIBRE 2 READER) DEVI 1 each by Does not apply route daily. 6 each 3   Continuous Blood Gluc Sensor (FREESTYLE LIBRE 2 SENSOR) MISC APPLY AS DIRECTED EVERY 14 DAYS 6 each 3   fluticasone (FLONASE) 50 MCG/ACT nasal spray Place 2 sprays into both nostrils daily. 16 g 1   glucose blood (ONETOUCH VERIO) test strip Test three times daily as directed. DX: E11.9 300 each 3   HYDROcodone bit-homatropine (HYCODAN) 5-1.5 MG/5ML syrup Take 5 mLs by mouth every 8 (eight) hours as needed for cough. 120 mL 0   Insulin Pen Needle 32G X 4 MM MISC 1 each by Other route daily. E11.9; THIS IS THE CORRECT PEN NEEDLE. PLEASE FILL RX AS ORDERED 100 each 3   JARDIANCE 25 MG TABS tablet TAKE 1 TABLET BY MOUTH DAILY 90 tablet 3   LANTUS SOLOSTAR  100 UNIT/ML Solostar Pen INJECT SUBCUTANEOUSLY 26  UNITS AT BEDTIME 30 mL 3   meclizine (ANTIVERT) 25 MG tablet Take 25 mg by mouth 3 (three) times daily as needed for dizziness.     metFORMIN (GLUCOPHAGE) 1000 MG  tablet Take 1 tablet (1,000 mg total) by mouth 2 (two) times daily with a meal. 180 tablet 3   omeprazole (PRILOSEC) 40 MG capsule TAKE 1 CAPSULE BY MOUTH 30-60 MINUTES PRIOR TO BREAKFAST AND DINNER 60 capsule 3   OneTouch Delica Lancets 35W MISC 1 each by Does not apply route daily. E11.9 100 each 0   Probiotic Product (PROBIOTIC ADVANCED PO) Take 1 tablet by mouth at bedtime.      rosuvastatin (CRESTOR) 10 MG tablet TAKE 1 TABLET BY MOUTH DAILY 30 tablet 11   sacubitril-valsartan (ENTRESTO) 24-26 MG Take 1 tablet by mouth 2 (two) times daily. 60 tablet 11   sertraline (ZOLOFT) 100 MG tablet TAKE 1 TABLET BY MOUTH DAILY 90 tablet 3   spironolactone (ALDACTONE) 25 MG tablet Take 1 tablet (25 mg total) by mouth at bedtime. 90 tablet 3   TRULICITY 3 SF/6.8LE SOPN INJECT 3 MG INTO THE SKIN  WEEKLY 6 mL 1   vitamin B-12 (CYANOCOBALAMIN) 100 MCG tablet Take 100 mcg by mouth daily.     Vitamin E 180 MG CAPS Take 180 mg by mouth at bedtime.      No current facility-administered medications on file prior to visit.   Allergies  Allergen Reactions   Invokana [Canagliflozin] Other (See Comments)    UTI's   Amoxicillin Other (See Comments)    Sore tongue Did it involve swelling of the face/tongue/throat, SOB, or low BP? No Did it involve sudden or severe rash/hives, skin peeling, or any reaction on the inside of your mouth or nose? No Did you need to seek medical attention at a hospital or doctor's office? No When did it last happen?      5-10 years ago If all above answers are "NO", may proceed with cephalosporin use.    Family History  Problem Relation Age of Onset   Liver disease Mother    Dementia Mother    Diabetes Mother        borderline   Coronary artery disease Father     Heart attack Father    Hypertension Father    Heart disease Father    Cancer Other        leukemia   Stroke Maternal Grandfather    Hyperlipidemia Brother        Amyloidosis   Diabetes Maternal Grandmother    Cancer Paternal Uncle        unknown   Heart attack Paternal Grandmother    Heart attack Paternal Uncle    Hypertension Brother    Diabetes Daughter        borderline   Colon cancer Neg Hx    Esophageal cancer Neg Hx    Rectal cancer Neg Hx    Stomach cancer Neg Hx    PE: BP 130/80 (BP Location: Left Arm, Patient Position: Sitting, Cuff Size: Normal)   Pulse 91   Ht _0  (1.575 m)   Wt 134 lb 12.8 oz (61.1 kg)   SpO2 98%   BMI 24.66 kg/m   Wt Readings from Last 3 Encounters:  05/23/22 134 lb 12.8 oz (61.1 kg)  05/03/22 124 lb 9.6 oz (56.5 kg)  04/09/22 133 lb (60.3 kg)   Constitutional: Normal weight, in NAD Eyes: EOMI, no exophthalmos ENT:  + full thyroid on palpation (L>R lobe), no cervical lymphadenopathy Cardiovascular: RRR, No MRG Respiratory: CTA B Musculoskeletal: no deformities Skin: no rashes Neurological: + tremor with outstretched hands  ASSESSMENT: 1. DM2, insulin-dependent, uncontrolled, without long term complications,  but with hyperglycemia  2. HL  3.  Multiple thyroid nodules  PLAN:  1. Patient with longstanding, previously uncontrolled, type 2 diabetes, on a complex medication regimen with metformin, SGLT2 inhibitor, GLP-1 receptor agonist and daily insulin with improved control in the last year.  HbA1c obtained at last visit was at goal, lower, at 6.2%.  At that time, sugars appears to be very well controlled without any more lows and only with occasional higher blood sugars higher than 180 after dinner.  We discussed about improving this meal but we did not change her regimen. CGM interpretation: -At today's visit, we reviewed her CGM downloads: It appears that 90% of values are in target range (goal >70%), while 9% are higher than 180  (goal <25%), and 1% are lower than 70 (goal <4%).  The calculated average blood sugar is 128.  The projected HbA1c for the next 3 months (GMI) is 6.4%. -Reviewing the CGM trends, sugars appear to be slightly better controlled than at last visit, with only occasional low blood sugars sometimes in the early afternoon and sometimes in the early morning.  I advised her to reduce the Lantus dose to avoid further lows  Her blood sugars after dinner are better.  No need to add meglitinides before dinner for now.  We will continue the rest of the regimen.  She needs a new application sent to [employer]-will fill this out for her and send it. - I suggested to:  Patient Instructions  Please continue: - Metformin 2000 mg with dinner - Jardiance 25 mg daily before breakfast - Trulicity 3 mg weekly  Please decrease: - Lantus 22 units at night   Please return in 4 months.   - we checked her HbA1c: 6.5% (slightly higher) - advised to check sugars at different times of the day - 4x a day, rotating check times - advised for yearly eye exams >> she is UTD - return to clinic in 4 months  2. HL -Reviewed latest lipid panel from 06/2021: All fractions at goal: Lab Results  Component Value Date   CHOL 109 07/24/2021   HDL 46 07/24/2021   LDLCALC 47 07/24/2021   LDLDIRECT 137.9 02/04/2013   TRIG 78 07/24/2021   CHOLHDL 2.4 07/24/2021  -She continues on Crestor 10 mg daily without side effects  3.  Thyroid nodules -Her left thyroid lobe was enlarged on exam so we checked a thyroid ultrasound in 01/2020.  She had several nodules, of which the left inferior nodule had an indication for biopsy.  She had a biopsy performed and this was benign.  For the 2 of the other nodules, a follow-up was indicated in 1 year.  She had another thyroid ultrasound on 02/07/2021.  The nodules appeared to be either stable or decreased in size.  No imaging follow-up was indicated -At today's visit, she has no masses felt on palpation or  neck compression symptoms -Latest TSH was reviewed and this was normal earlier this month: Lab Results  Component Value Date   TSH 1.087 05/03/2022  -We we will continue to follow her clinically for now  Philemon Kingdom, MD PhD [facility]

## 2022-05-30 ENCOUNTER — Encounter: Payer: Self-pay | Admitting: Family

## 2022-06-04 ENCOUNTER — Telehealth: Payer: Self-pay

## 2022-06-04 NOTE — Telephone Encounter (Signed)
Patient informed that patient assistance has been approved for Trulicty via [employer]

## 2022-07-02 ENCOUNTER — Encounter: Payer: Self-pay | Admitting: Family

## 2022-07-10 ENCOUNTER — Ambulatory Visit: Payer: HMO

## 2022-07-11 ENCOUNTER — Other Ambulatory Visit: Payer: Self-pay | Admitting: Family

## 2022-07-11 ENCOUNTER — Ambulatory Visit
Admission: RE | Admit: 2022-07-11 | Discharge: 2022-07-11 | Disposition: A | Discharge status: Home / Self Care | Payer: PPO | Source: Ambulatory Visit | Attending: Obstetrics and Gynecology | Admitting: Obstetrics and Gynecology

## 2022-07-11 DIAGNOSIS — Z1231 Encounter for screening mammogram for malignant neoplasm of breast: Secondary | ICD-10-CM

## 2022-07-18 ENCOUNTER — Other Ambulatory Visit: Payer: Self-pay | Admitting: Family

## 2022-07-18 MED ORDER — ROSUVASTATIN CALCIUM 10 MG PO TABS: 10.0000 mg | ORAL_TABLET | Freq: Every day | ORAL | 11 refills | Status: DC

## 2022-08-10 ENCOUNTER — Other Ambulatory Visit (HOSPITAL_COMMUNITY): Payer: Self-pay

## 2022-08-10 MED ORDER — CARVEDILOL 6.25 MG PO TABS: 6.2500 mg | ORAL_TABLET | Freq: Two times a day (BID) | ORAL | 2 refills | Status: DC

## 2022-08-16 DIAGNOSIS — M1612 Unilateral primary osteoarthritis, left hip: Secondary | ICD-10-CM | POA: Diagnosis not present

## 2022-08-17 ENCOUNTER — Encounter (HOSPITAL_COMMUNITY): Payer: Self-pay | Admitting: Cardiology

## 2022-08-24 ENCOUNTER — Other Ambulatory Visit (HOSPITAL_COMMUNITY): Payer: Self-pay | Admitting: Cardiology

## 2022-09-20 DIAGNOSIS — Z124 Encounter for screening for malignant neoplasm of cervix: Secondary | ICD-10-CM | POA: Diagnosis not present

## 2022-09-20 DIAGNOSIS — M816 Localized osteoporosis [Lequesne]: Secondary | ICD-10-CM | POA: Diagnosis not present

## 2022-09-20 DIAGNOSIS — N958 Other specified menopausal and perimenopausal disorders: Secondary | ICD-10-CM | POA: Diagnosis not present

## 2022-09-20 DIAGNOSIS — R29890 Loss of height: Secondary | ICD-10-CM | POA: Diagnosis not present

## 2022-09-20 DIAGNOSIS — Z6824 Body mass index (BMI) 24.0-24.9, adult: Secondary | ICD-10-CM | POA: Diagnosis not present

## 2022-09-26 ENCOUNTER — Encounter: Payer: Self-pay | Admitting: Internal Medicine

## 2022-09-26 ENCOUNTER — Ambulatory Visit (INDEPENDENT_AMBULATORY_CARE_PROVIDER_SITE_OTHER): Payer: PPO | Admitting: Internal Medicine

## 2022-09-26 VITALS — BP 118/72 | HR 87 | Ht 62.0 in | Wt 132.8 lb

## 2022-09-26 DIAGNOSIS — E118 Type 2 diabetes mellitus with unspecified complications: Secondary | ICD-10-CM

## 2022-09-26 DIAGNOSIS — E785 Hyperlipidemia, unspecified: Secondary | ICD-10-CM

## 2022-09-26 DIAGNOSIS — E1165 Type 2 diabetes mellitus with hyperglycemia: Secondary | ICD-10-CM

## 2022-09-26 DIAGNOSIS — Z794 Long term (current) use of insulin: Secondary | ICD-10-CM | POA: Diagnosis not present

## 2022-09-26 DIAGNOSIS — E042 Nontoxic multinodular goiter: Secondary | ICD-10-CM | POA: Diagnosis not present

## 2022-09-26 LAB — POCT GLYCOSYLATED HEMOGLOBIN (HGB A1C): Hemoglobin A1C: 6.4 % — AB (ref 4.0–5.6)

## 2022-09-26 MED ORDER — LANTUS SOLOSTAR 100 UNIT/ML ~~LOC~~ SOPN: PEN_INJECTOR | SUBCUTANEOUS | 3 refills | Status: DC

## 2022-09-26 MED ORDER — METFORMIN HCL 1000 MG PO TABS: 1000.0000 mg | ORAL_TABLET | Freq: Two times a day (BID) | ORAL | 3 refills | Status: DC

## 2022-09-26 NOTE — Patient Instructions (Addendum)
Please continue: - Metformin 2000 mg with dinner - Jardiance 25 mg daily before breakfast - Trulicity 3 mg weekly  Please reduce: - Lantus 18 units at night   Please return in 4 months.

## 2022-09-26 NOTE — Progress Notes (Signed)
Patient ID: Margaret Norman, female   DOB: [birth year], 72 y.o.   MRN: [id number]   Margaret: DAILANI Norman is a 72 y.o.-year-old female, returning for follow-up for DM2, dx in 2008, insulin-dependent since 2016, uncontrolled, without long term complications.  Last visit  4 months ago.  Interim history:  No increased urination, blurry vision, chest pain.  She has a history of vertigo but now improved.  She still has dizziness/lightheadedness.  Reviewed HbA1c levels: Lab Results  Component Value Date   HGBA1C 6.5 (A) 05/23/2022   HGBA1C 6.2 (A) 01/17/2022   HGBA1C 6.7 (A) 09/15/2021  03/19/2017: HbA1c calculated from fructosamine is 6.7%! 12/31/2016: HbA1c calculated from fructosamine: 6.9%!  Pt is on: - Metformin 1000 mg 2x a day with meals >> 2000 mg with dinner - Jardiance 25 mg daily before breakfast (PAP) - Trulicity 1.5 >> 3 mg weekly (PAP) - Lantus 30 >> 26 >> 22 units at night Stopped Actos when started trulicity back. She was on Trulicity but became expensive >> changed to Actos. She was on Invokana >> recurrent UTIs.  Pt checks her sugars >4x a day:  Previously:  Previously:   Lowest sugar was 74 >> 66 >> 58 >> 66; she has hypoglycemia awareness in the 70s. Highest sugar was 200s >> 200 >> 200s.  Glucometer: OneTouch Verio  -No CKD, last BUN/creatinine:  Lab Results  Component Value Date   BUN 14 05/03/2022   BUN 15 10/23/2021   CREATININE 1.00 05/03/2022   CREATININE 0.66 10/23/2021  On Entresto.  -+ HL; last set of lipids: Lab Results  Component Value Date   CHOL 109 07/24/2021   HDL 46 07/24/2021   LDLCALC 47 07/24/2021   LDLDIRECT 137.9 02/04/2013   TRIG 78 07/24/2021   CHOLHDL 2.4 07/24/2021  On Crestor 10.  - last eye exam was in 10/26/2021: No DR, + cataract OS.  Dr. Marica Otter.   -+ numbness and tingling in her toes - after chemotx.  She has restless leg syndrome.  Last foot exam 09/15/2021.  Pt has FH of DM in MGM.  Multinodular goiter:    -Detected on palpation in 2021  Thyroid ultrasound (01/26/2020): Parenchymal Echotexture: Moderately heterogenous Isthmus: 0.3 cm Right lobe: 6.2 cm x 2.1 cm x 2.2 cm Left lobe: 7.7 cm x 3.2 cm x 2.8 cm _________________________________________________________   Nodule # 1: Location: Right; Superior Maximum size: 0.9 cm; Other 2 dimensions: 0.7 cm x 0.7 cm Composition: spongiform (0) Spongiform nodule does not meet criteria for surveillance or biopsy  _________________________________________________________   Nodule # 2: Location: Right; Mid Maximum size: 1.0 cm; Other 2 dimensions: 0.9 cm x 0.6 cm Composition: spongiform (0) Spongiform nodule does not meet criteria for surveillance or biopsy  _________________________________________________________   Nodule # 3:  Location: Right; Mid Maximum size: 1.2 cm; Other 2 dimensions: 1.1 cm x 0.7 cm Composition: spongiform (0) Spongiform nodule does not meet criteria for surveillance or biopsy _________________________________________________________   Nodule # 4:  Location: Right; Inferior Maximum size: 1.0 cm; Other 2 dimensions: 0.8 cm x 0.8 cm Composition: spongiform (0) Spongiform nodule does not meet criteria for surveillance or biopsy  ________________________________________________________   Nodule # 5:  Location: Right; Inferior Maximum size: 1.5 cm; Other 2 dimensions: 1.4 cm x 0.8 cm Composition: solid/almost completely solid (2) Echogenicity: isoechoic (1) Nodule meets criteria for surveillance  _________________________________________________________   Nodule # 6: Location: Left; Superior Maximum size: 1.2 cm; Other 2 dimensions: 1.1 cm x 0.9 cm  Composition: cannot determine (2) Echogenicity: isoechoic (1) Echogenic foci: macrocalcifications (1)  Nodule meets criteria for surveillance  _______________________________________________________   Nodule # 7: Location: Left; Inferior Maximum size: 4.7  cm; Other 2 dimensions: 4.7 cm x 2.8 cm Composition: mixed cystic and solid (1) Echogenicity: isoechoic (1) Echogenic foci: macrocalcifications (1) Nodule meets criteria for biopsy  ______________________________________________________   No adenopathy   IMPRESSION: Multinodular thyroid.   Left inferior thyroid nodule (labeled 7, TR , 4.7 cm) meets criteria for biopsy, as designated by the newly established ACR TI-RADS criteria, and referral for biopsy is recommended.   Right inferior thyroid nodule (labeled 5, 1.5 cm, TR 3) and the left superior thyroid nodule (labeled 6, 1.2 cm, TR 4) both meet criteria for surveillance, as designated by the newly established ACR TI-RADS criteria. Surveillance ultrasound study recommended to be performed annually up to 5 years.   FNA left inferior thyroid nodule (02/16/2020): Benign  Thyroid U/S (02/07/2021): Parenchymal Echotexture: Markedly heterogenous Isthmus: 0.6 cm Right lobe: 7.4 cm x 2.0 cm x 2.5 cm Left lobe: 8.4 cm x 3.2 cm x 4.3 cm _________________________________________________________   Estimated total number of nodules >/= 1 cm: 6-10  _________________________________________________________   Nodule labeled 1 superior right thyroid, slightly smaller than previous and spongiform. Nodule does not meet criteria for surveillance or biopsy   Nodule labeled 2, right thyroid, TR 2 with cystic components measures less than 1 cm. Nodule does not meet criteria for surveillance or biopsy   Nodule labeled 3, mid right thyroid. Spongiform characteristics measuring 1.3 cm. Nodule does not meet criteria for surveillance or biopsy.   Nodule labeled 4, mid right thyroid, 9 mm, spongiform characteristics. Nodule does not meet criteria for surveillance or biopsy.   Nodule labeled 5, 0.98 cm, spongiform characteristics and does not meet criteria for surveillance or biopsy.   Nodule 6, right lower thyroid, 0.96 cm. Nodule has  spongiform characteristics and does not meet criteria for surveillance or biopsy.   Nodule 7, right lower thyroid, smaller than previous, now 1.0 cm. Previous nodule was labeled 5, 1.5 cm. Nodule has clearly spongiform characteristics on the current ultrasound, and has decreased in size below 1 cm. Nodule no longer meets criteria for surveillance.   Nodule labeled 8 on the left, upper left thyroid, 9 mm. Nodule has internal cystic change on the current, TR 2 characteristics, and has decreased in size below 1 cm. Nodule no longer meets criteria for continued surveillance.   The nodules at the left inferior thyroid labeled 9, 10, 11, appear to all be part of the previous 4.7 cm nodule which was previously biopsied 02/16/2020, and are doubtful to represent independent nodules given the available images and the comparison.   No adenopathy   Recommendations follow those established by the new ACR TI-RADS criteria (J Am Coll Radiol 2017;14:587-595).   IMPRESSION: Enlarged multinodular thyroid, as above.   Nodule inferior left thyroid has been previously biopsied, as above. Assuming benign result, no further specific follow-up would be indicated.  Pt denies: - feeling nodules in neck - hoarseness - dysphagia - choking  Latest TSH was normal Lab Results  Component Value Date   TSH 1.087 05/03/2022   She sees Cardiology - Dr. Aundra Dubin >> on [place], [place]. She also has a history of breast cancer, s/p chemoradiation therapy. She fractured humerus in 02/2020 after tripping >> this is healing.  She had a DXA scan >> normal.  She is on Arimidex.  She had lightheadedness and dizziness at last  visit.  She was in the emergency room with vertigo 06/30/2021.  ROS: + See Margaret  I reviewed pt's medications, allergies, PMH, social hx, family hx, and changes were documented in the history of present illness. Otherwise, unchanged from my initial visit note.  Past Medical History:  Diagnosis  Date   Arthritis    Chest pain    Nuclear, adenosine,  December, 2013, low risk nuclear scan with small, moderate in intensity, fixed anteroseptal defect. This is possibly related to an LBBB versus small prior infarct. 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 w/appy   CHOLECYSTECTOMY     ESOPHAGOGASTRODUODENOSCOPY     HERNIA REPAIR     KNEE ARTHROSCOPY Right 11/2005   LIVER BIOPSY     PANCREATIC CYST EXCISION     PORT-A-CATH REMOVAL Left 04/25/2020   Procedure: REMOVAL PORT-A-CATH;  Surgeon: Coralie Keens, MD;  Location: [facility];  Service: General;  Laterality: Left;   PORTACATH PLACEMENT Left 04/15/2019   Procedure: INSERTION PORT-A-CATH WITH ULTRASOUND;  Surgeon: Coralie Keens, MD;  Location: [facility];  Service: General;  Laterality: Left;   RIGHT/LEFT HEART CATH AND CORONARY ANGIOGRAPHY N/A 09/28/2019   Procedure: RIGHT/LEFT HEART CATH AND CORONARY ANGIOGRAPHY;  Surgeon: Larey Dresser, MD;  Location: [facility];  Service: Cardiovascular;  Laterality: N/A;   SPLENECTOMY     TONSILLECTOMY AND  ADENOIDECTOMY     Social History   Socioeconomic History   Marital status: Married    Spouse name: Latece Bidgood   Number of children: 2   Years of education: Not on file   Highest education level: Not on file  Occupational History   Occupation: CSR    Employer: [employer]   Occupation: retired  Tobacco Use   Smoking status: Never   Smokeless tobacco: Never  Vaping Use   Vaping Use: Never used  Substance and Sexual Activity   Alcohol use: No   Drug use: No   Sexual activity: Not Currently  Other Topics Concern   Not on file  Social History Narrative   Regular Exercise -  NO   Social Determinants of Health   Financial Resource Strain: Low Risk  (01/28/2020)   Overall Financial Resource Strain (CARDIA)    Difficulty of Paying Living Expenses: Not hard at all  Food Insecurity: No Food Insecurity (01/28/2020)   Hunger Vital Sign    Worried About Running Out of Food in the Last Year: Never true    [place] in the Last Year:  Never true  Transportation Needs: No Transportation Needs (01/28/2020)   PRAPARE - Hydrologist (Medical): No    Lack of Transportation (Non-Medical): No  Physical Activity: Sufficiently Active (01/28/2020)   Exercise Vital Sign    Days of Exercise per Week: 5 days    Minutes of Exercise per Session: 30 min  Stress: No Stress Concern Present (12/13/2021)   [place]    Feeling of Stress : Not at all  Social Connections: Mariano Colon (01/28/2020)   Social Connection and Isolation Panel [NHANES]    Frequency of Communication with Friends and Family: More than three times a week    Frequency of Social Gatherings with Friends and Family: More than three times a week    Attends Religious Services: More than 4 times per year    Active Member of [employer] or Organizations: Yes    Attends Music therapist: More than 4 times per year     Marital Status: Married  Human resources officer Violence: Not At Risk (01/28/2020)   Humiliation, Afraid, Rape, and Kick questionnaire    Fear of Current or Ex-Partner: No    Emotionally Abused: No    Physically Abused: No    Sexually Abused: No   Current Outpatient Medications on File Prior to Visit  Medication Sig Dispense Refill   acetaminophen (TYLENOL) 500 MG tablet Take 1,000 mg by mouth every 6 (six) hours as needed for moderate pain.     anastrozole (ARIMIDEX) 1 MG tablet Take 1 tablet (1 mg total) by mouth daily. 90 tablet 3   aspirin 81 MG EC tablet Take 81 mg by mouth daily.     benzonatate (TESSALON) 100 MG capsule Take 1 capsule (100 mg total) by mouth 3 (three) times daily as needed. 20 capsule 0   Blood Glucose Monitoring Suppl (ONETOUCH VERIO FLEX SYSTEM) W/DEVICE KIT 1 kit by Does not apply route 3 (three) times daily. 1 kit 1   buPROPion (WELLBUTRIN XL) 150 MG 24 hr tablet TAKE 1 TABLET BY MOUTH EVERY MORNING 90 tablet 0   carvedilol (COREG) 6.25 MG tablet Take 1 tablet (6.25 mg total) by mouth 2 (two) times daily. Please call office to schedule a follow up appointment 60 tablet 2   Cholecalciferol (VITAMIN D) 50 MCG (2000 UT) tablet Take 2,000 Units by mouth daily.     Continuous Blood Gluc Receiver (FREESTYLE LIBRE 2 READER) DEVI 1 each by Does not apply route daily. 6 each 3   Continuous Blood Gluc Sensor (FREESTYLE LIBRE 2 SENSOR) MISC APPLY AS DIRECTED EVERY 14 DAYS 6 each 3   ENTRESTO 24-26 MG TAKE 1 TABLET BY MOUTH TWICE DAILY 60 tablet 11   fluticasone (FLONASE) 50 MCG/ACT nasal spray Place 2 sprays into both nostrils daily. 16 g 1   glucose blood (ONETOUCH VERIO) test strip Test three times daily as directed. DX: E11.9 300 each 3   HYDROcodone bit-homatropine (HYCODAN) 5-1.5 MG/5ML syrup Take 5 mLs by mouth every 8 (eight) hours as needed for cough. 120 mL 0   Insulin Pen Needle 32G X 4 MM MISC 1 each by Other route daily. E11.9; THIS IS THE CORRECT PEN NEEDLE. PLEASE FILL  RX AS ORDERED 100 each 3   JARDIANCE 25 MG TABS tablet TAKE 1 TABLET BY MOUTH DAILY 90 tablet 3   LANTUS SOLOSTAR 100 UNIT/ML Solostar Pen INJECT SUBCUTANEOUSLY 26  UNITS AT BEDTIME 30 mL 3   meclizine (  ANTIVERT) 25 MG tablet Take 25 mg by mouth 3 (three) times daily as needed for dizziness.     metFORMIN (GLUCOPHAGE) 1000 MG tablet Take 1 tablet (1,000 mg total) by mouth 2 (two) times daily with a meal. 180 tablet 3   omeprazole (PRILOSEC) 40 MG capsule TAKE 1 CAPSULE BY MOUTH 30-60 MINUTES PRIOR TO BREAKFAST AND DINNER 60 capsule 3   OneTouch Delica Lancets 99991111 MISC 1 each by Does not apply route daily. E11.9 100 each 0   Probiotic Product (PROBIOTIC ADVANCED PO) Take 1 tablet by mouth at bedtime.      rosuvastatin (CRESTOR) 10 MG tablet Take 1 tablet (10 mg total) by mouth daily. 30 tablet 11   sertraline (ZOLOFT) 100 MG tablet TAKE 1 TABLET BY MOUTH DAILY 90 tablet 3   spironolactone (ALDACTONE) 25 MG tablet Take 1 tablet (25 mg total) by mouth at bedtime. 90 tablet 3   TRULICITY 3 0000000 SOPN INJECT 3 MG (0.5 ML) UNDER THE SKIN ONCE A WEEK 8 mL 0   vitamin B-12 (CYANOCOBALAMIN) 100 MCG tablet Take 100 mcg by mouth daily.     Vitamin E 180 MG CAPS Take 180 mg by mouth at bedtime.      No current facility-administered medications on file prior to visit.   Allergies  Allergen Reactions   Invokana [Canagliflozin] Other (See Comments)    UTI's   Amoxicillin Other (See Comments)    Sore tongue Did it involve swelling of the face/tongue/throat, SOB, or low BP? No Did it involve sudden or severe rash/hives, skin peeling, or any reaction on the inside of your mouth or nose? No Did you need to seek medical attention at a hospital or doctor's office? No When did it last happen?      5-10 years ago If all above answers are "NO", may proceed with cephalosporin use.    Family History  Problem Relation Age of Onset   Liver disease Mother    Dementia Mother    Diabetes Mother         borderline   Coronary artery disease Father    Heart attack Father    Hypertension Father    Heart disease Father    Cancer Other        leukemia   Stroke Maternal Grandfather    Hyperlipidemia Brother        Amyloidosis   Diabetes Maternal Grandmother    Cancer Paternal Uncle        unknown   Heart attack Paternal Grandmother    Heart attack Paternal Uncle    Hypertension Brother    Diabetes Daughter        borderline   Colon cancer Neg Hx    Esophageal cancer Neg Hx    Rectal cancer Neg Hx    Stomach cancer Neg Hx    PE: BP 118/72 (BP Location: Right Arm, Patient Position: Sitting, Cuff Size: Normal)   Pulse 87   Ht 5\' 2"  (1.575 m)   Wt 132 lb 12.8 oz (60.2 kg)   SpO2 96%   BMI 24.29 kg/m   Wt Readings from Last 3 Encounters:  09/26/22 132 lb 12.8 oz (60.2 kg)  05/23/22 134 lb 12.8 oz (61.1 kg)  05/03/22 124 lb 9.6 oz (56.5 kg)   Constitutional: Normal weight, in NAD Eyes: EOMI, no exophthalmos ENT:  + full thyroid on palpation (L>R lobe), no cervical lymphadenopathy Cardiovascular: RRR, No MRG Respiratory: CTA B Musculoskeletal: no deformities Skin: no rashes Neurological: +  tremor with outstretched hands Diabetic Foot Exam - Simple   Simple Foot Form Diabetic Foot exam was performed with the following findings: Yes 09/26/2022 10:13 AM  Visual Inspection No deformities, no ulcerations, no other skin breakdown bilaterally: Yes Sensation Testing Intact to touch and monofilament testing bilaterally: Yes Pulse Check Posterior Tibialis and Dorsalis pulse intact bilaterally: Yes Comments    ASSESSMENT: 1. DM2, insulin-dependent, uncontrolled, without long term complications, but with hyperglycemia  2. HL  3.  Multiple thyroid nodules  PLAN:  1. Patient with longstanding, previously uncontrolled, type 2 diabetes, on a complex medication regimen with metformin, SGLT2 inhibitor, weekly GLP-1 receptor agonist and long-acting insulin, with improved control.  At  last visit, HbA1c was at goal, at 6.5%, only slightly higher than before.  Reviewing the CGM trends at that time, sugars were slightly better controlled than at the previous visit, with only occasional lower blood sugars sometimes in the early afternoon and sometimes in the early morning.  We reduced her Lantus dose. CGM interpretation: -At today's visit, we reviewed her CGM downloads: It appears that 87% of values are in target range (goal >70%), while 12% are higher than 180 (goal <25%), and 1% are lower than 70 (goal <4%).  The calculated average blood sugar is 135.  The projected HbA1c for the next 3 months (GMI) is 6.5%. -Reviewing the CGM trends, sugars appear to be fluctuating within the target range, with occasional slightly hyperglycemic values especially after dinner and less frequently after breakfast and lunch.  She does have occasional low blood sugars during the night, in the upper 60s.  I advised her to continue to reduce his Lantus dose.  Will not change his regimen otherwise but we discussed about experimenting with different dinners to see if she can avoid blood sugars in the 200s after this meal. -I refilled her metformin and Lantus. - I suggested to:  Patient Instructions  Please continue: - Metformin 2000 mg with dinner - Jardiance 25 mg daily before breakfast - Trulicity 3 mg weekly  Please reduce: - Lantus 18 units at night   Please return in 4 months.   - we checked her HbA1c: 6.4% (slightly better) - advised to check sugars at different times of the day - 4x a day, rotating check times - advised for yearly eye exams >> she is UTD - return to clinic in 4 months  2. HL -Reviewed latest lipid panel from 06/2021: All fractions at goal: Lab Results  Component Value Date   CHOL 109 07/24/2021   HDL 46 07/24/2021   LDLCALC 47 07/24/2021   LDLDIRECT 137.9 02/04/2013   TRIG 78 07/24/2021   CHOLHDL 2.4 07/24/2021  -She continues Crestor 10 mg daily without side  effects -She has an upcoming annual physical exam with PCP  3.  Thyroid nodules -Her left thyroid lobe was enlarged on exam so we checked a thyroid ultrasound in 01/2020.  She had several nodules, of which the left inferior nodule had an indication for biopsy.  She had a biopsy performed and this was benign.  For the 2 of the other nodules, a follow-up was indicated in 1 year.  She had another thyroid ultrasound on 02/07/2021.  The nodules appears to be either stable or decreased in size.  No imaging follow-up is indicated. -No masses felt on palpation of her neck today or neck compression symptoms -Latest TSH reviewed-normal: Lab Results  Component Value Date   TSH 1.087 05/03/2022  -We will continue to follow her  clinically for now  Philemon Kingdom, MD PhD [facility] Endocrinology

## 2022-10-15 NOTE — Progress Notes (Signed)
PCP: Olive Bass, FNP Oncology: Dr. Pamelia Hoit HF cardiology: Dr. Shirlee Latch   72 y.o. with history of DM, chronic LBBB, and breast cancer was referred by Dr. Pamelia Hoit for workup of fall in EF.  She was diagnosed with right breast cancer, ER+/PR+/HER2+ in 9/20.  She had right lumpectomy in 10/20.  Starting in 11/20, she had been getting 12 cycles of Taxol/Herceptin to be followed by Herceptin alone to complete a year. Echo in 10/20 showed EF 55-60% per report (looked more like 50% on my review, likely due to septal-lateral dyssynchrony).  Her repeat echo for Herceptin screening was done in 1/21, showing EF down to 30-35% with septal-lateral dyssynchrony.  Herceptin has been stopped for now.  She is currently undergoing radiation therapy.   Echo in 3/21 showed that EF remains 30-35% with prominent septal-lateral dyssynchrony.  Patient has a strong family history of heart problems.  Father died from "ruptured aorta."  Sister had heart valve repair, one brother has a pacemaker, and another brother has AL amyloidosis.   LHC/RHC was done in 4/21. This showed normal filling pressures, preserved cardiac output, and no significant CAD. Cardiac MRI in 5/21 showed EF 36% with septal-lateral dyssynchrony, RV EF normal 45%, no LGE.  Echo in 10/21 showed EF 40-45% with septal-lateral dyssynchrony.   Echo in 4/22 showed EF 45% with septal-lateral dyssynchrony, normal RV.  Echo in 4/23 showed EF 45% with septal-lateral dyssynchrony, normal RV, IVC normal.   Today she returns for HF follow up. Overall feeling fatigued. She has had more dizzy spells with position changes. She does not have SOB walking on flat ground, she can walk up steps if she takes her time. Has occasional atypical chest discomfort, 1-2x/month, improves with rest. She feels occasional palpitations.  Denies abnormal bleeding, edema, or PND/Orthopnea. Appetite ok. No fever or chills. Weight at home 130 pounds. Taking all medications. She  snores.  ReDs: 43%  Labs (1/21): K 3.8, creatinine 0.75 Labs (3/21): K 4.5, creatinine 0.71 Labs (4/21): K 4.3, creatinine 0.82 => 0.83 Labs (8/21): hgb 12.7, K 4.7, creatinine 0.85 Labs (10/21): K 4.2, creatinine 0.92 Labs (1/22): LDL 37, HDL 47, K 4.3, creatinine 4.09 Labs (1/23): K 4.3, creatinine 0.86, hgb 12, LDL 47 Labs (5/23): K 4.2, creatinine 0.66 Labs (11/23): K 4.2, creatinine 1.0  ECG (personally reviewed): none today.  PMH: 1. Depression 2. Type 2 diabetes 3. Chronic LBBB 4. H/o transient complete heart block: in 10/20 when she was having port-a-cath placed.  5. HTN 6. Hyperlipidemia.  7. Breast cancer: She was diagnosed with right breast cancer, ER+/PR+/HER2+ in 9/20.  She had right lumpectomy in 10/20.  Starting in 11/20, she has been getting 12 cycles of Taxol/Herceptin to be followed by Herceptin alone to complete a year.  8. Chronic systolic CHF: She has had mildly decreased EF in the past, possibly related to LBBB.  Nonischemic cardiomyopathy.  - Echo (4/17): EF 50-55% - Cardiolite (4/17): EF 48% - Echo (10/20): EF 55-60% (reviewed, think more like 50% but technically difficult).  - Echo (1/21): EF 30-35%, mild LVH, septal dyssynchrony.  - Echo (3/21): EF 30-35% with prominent septal-lateral dyssynchrony.  - LHC/RHC (4/21): mean RA 1, PA 13/3, mean PCWP 2, CI 3.74, no significant CAD.  - Cardiac MRI (5/21): EF 36% with septal-lateral dyssynchrony, RV EF normal 45%, no LGE.  - Echo (10/21): EF 40-45%, septal-lateral dyssynchrony.  - Echo (4/22): EF 45% with septal-lateral dyssynchrony, normal RV. - Echo (4/23): EF 45% with septal-lateral dyssynchrony, normal  RV, IVC normal.   Social History   Socioeconomic History   Marital status: Married    Spouse name: Lakiska Feimster   Number of children: 2   Years of education: Not on file   Highest education level: Not on file  Occupational History   Occupation: CSR    Employer: [employer]   Occupation:  retired  Tobacco Use   Smoking status: Never   Smokeless tobacco: Never  Vaping Use   Vaping Use: Never used  Substance and Sexual Activity   Alcohol use: No   Drug use: No   Sexual activity: Not Currently  Other Topics Concern   Not on file  Social History Narrative   Regular Exercise -  NO   Social Determinants of Health   Financial Resource Strain: Low Risk  (01/28/2020)   Overall Financial Resource Strain (CARDIA)    Difficulty of Paying Living Expenses: Not hard at all  Food Insecurity: No Food Insecurity (01/28/2020)   Hunger Vital Sign    Worried About Running Out of Food in the Last Year: Never true    Ran Out of Food in the Last Year: Never true  Transportation Needs: No Transportation Needs (01/28/2020)   PRAPARE - Administrator, Civil Service (Medical): No    Lack of Transportation (Non-Medical): No  Physical Activity: Sufficiently Active (01/28/2020)   Exercise Vital Sign    Days of Exercise per Week: 5 days    Minutes of Exercise per Session: 30 min  Stress: No Stress Concern Present (12/13/2021)   [employer] of Occupational Health - Occupational Stress Questionnaire    Feeling of Stress : Not at all  Social Connections: Socially Integrated (01/28/2020)   Social Connection and Isolation Panel [NHANES]    Frequency of Communication with Friends and Family: More than three times a week    Frequency of Social Gatherings with Friends and Family: More than three times a week    Attends Religious Services: More than 4 times per year    Active Member of [employer] or Organizations: Yes    Attends Engineer, structural: More than 4 times per year    Marital Status: Married  Catering manager Violence: Not At Risk (01/28/2020)   Humiliation, Afraid, Rape, and Kick questionnaire    Fear of Current or Ex-Partner: No    Emotionally Abused: No    Physically Abused: No    Sexually Abused: No   Family History  Problem Relation Age of Onset   Liver disease  Mother    Dementia Mother    Diabetes Mother        borderline   Coronary artery disease Father    Heart attack Father    Hypertension Father    Heart disease Father    Cancer Other        leukemia   Stroke Maternal Grandfather    Hyperlipidemia Brother        Amyloidosis   Diabetes Maternal Grandmother    Cancer Paternal Uncle        unknown   Heart attack Paternal Grandmother    Heart attack Paternal Uncle    Hypertension Brother    Diabetes Daughter        borderline   Colon cancer Neg Hx    Esophageal cancer Neg Hx    Rectal cancer Neg Hx    Stomach cancer Neg Hx    Current Outpatient Medications  Medication Sig Dispense Refill   acetaminophen (TYLENOL)  500 MG tablet Take 1,000 mg by mouth every 6 (six) hours as needed for moderate pain.     anastrozole (ARIMIDEX) 1 MG tablet Take 1 tablet (1 mg total) by mouth daily. 90 tablet 3   aspirin 81 MG EC tablet Take 81 mg by mouth daily.     benzonatate (TESSALON) 100 MG capsule Take 1 capsule (100 mg total) by mouth 3 (three) times daily as needed. 20 capsule 0   Blood Glucose Monitoring Suppl (ONETOUCH VERIO FLEX SYSTEM) W/DEVICE KIT 1 kit by Does not apply route 3 (three) times daily. 1 kit 1   buPROPion (WELLBUTRIN XL) 150 MG 24 hr tablet TAKE 1 TABLET BY MOUTH EVERY MORNING 90 tablet 0   carvedilol (COREG) 6.25 MG tablet Take 1 tablet (6.25 mg total) by mouth 2 (two) times daily. Please call office to schedule a follow up appointment 60 tablet 2   Cholecalciferol (VITAMIN D) 50 MCG (2000 UT) tablet Take 2,000 Units by mouth daily.     Continuous Blood Gluc Receiver (FREESTYLE LIBRE 2 READER) DEVI 1 each by Does not apply route daily. 6 each 3   Continuous Blood Gluc Sensor (FREESTYLE LIBRE 2 SENSOR) MISC APPLY AS DIRECTED EVERY 14 DAYS 6 each 3   ENTRESTO 24-26 MG TAKE 1 TABLET BY MOUTH TWICE DAILY 60 tablet 11   fluticasone (FLONASE) 50 MCG/ACT nasal spray Place 2 sprays into both nostrils daily. 16 g 1   glucose blood  (ONETOUCH VERIO) test strip Test three times daily as directed. DX: E11.9 300 each 3   HYDROcodone bit-homatropine (HYCODAN) 5-1.5 MG/5ML syrup Take 5 mLs by mouth every 8 (eight) hours as needed for cough. 120 mL 0   Insulin Pen Needle 32G X 4 MM MISC 1 each by Other route daily. E11.9; THIS IS THE CORRECT PEN NEEDLE. PLEASE FILL RX AS ORDERED 100 each 3   JARDIANCE 25 MG TABS tablet TAKE 1 TABLET BY MOUTH DAILY 90 tablet 3   LANTUS SOLOSTAR 100 UNIT/ML Solostar Pen INJECT SUBCUTANEOUSLY 18-20  UNITS AT BEDTIME 30 mL 3   meclizine (ANTIVERT) 25 MG tablet Take 25 mg by mouth 3 (three) times daily as needed for dizziness.     metFORMIN (GLUCOPHAGE) 1000 MG tablet Take 1 tablet (1,000 mg total) by mouth 2 (two) times daily with a meal. 180 tablet 3   omeprazole (PRILOSEC) 40 MG capsule TAKE 1 CAPSULE BY MOUTH 30-60 MINUTES PRIOR TO BREAKFAST AND DINNER 60 capsule 3   OneTouch Delica Lancets 30G MISC 1 each by Does not apply route daily. E11.9 100 each 0   Probiotic Product (PROBIOTIC ADVANCED PO) Take 1 tablet by mouth at bedtime.      rosuvastatin (CRESTOR) 10 MG tablet Take 1 tablet (10 mg total) by mouth daily. 30 tablet 11   sertraline (ZOLOFT) 100 MG tablet TAKE 1 TABLET BY MOUTH DAILY 90 tablet 3   spironolactone (ALDACTONE) 25 MG tablet Take 1 tablet (25 mg total) by mouth at bedtime. 90 tablet 3   TRULICITY 3 MG/0.5ML SOPN INJECT 3 MG (0.5 ML) UNDER THE SKIN ONCE A WEEK 8 mL 0   vitamin B-12 (CYANOCOBALAMIN) 100 MCG tablet Take 100 mcg by mouth daily.     Vitamin E 180 MG CAPS Take 180 mg by mouth at bedtime.      No current facility-administered medications for this encounter.   Wt Readings from Last 3 Encounters:  10/16/22 61.5 kg (135 lb 9.6 oz)  09/26/22 60.2 kg (132 lb  12.8 oz)  05/23/22 61.1 kg (134 lb 12.8 oz)   Orthostatic VS for the past 72 hrs (Last 3 readings):  Orthostatic BP Patient Position BP Location Orthostatic Pulse  10/16/22 0900 122/78 Standing Right Arm 84   10/16/22 0859 120/80 Standing Right Arm 84  10/16/22 0858 110/80 Sitting Right Arm 82    BP 128/80   Pulse 84   Wt 61.5 kg (135 lb 9.6 oz)   SpO2 97%   BMI 24.80 kg/m  Physical Exam General:  NAD. No resp difficulty, walked into clinic. HEENT: Normal Neck: Supple. No JVD. Carotids 2+ bilat; no bruits. No lymphadenopathy or thryomegaly appreciated. Cor: PMI nondisplaced. Regular rate & rhythm. No rubs, gallops or murmurs. Lungs: Clear Abdomen: Soft, nontender, nondistended. No hepatosplenomegaly. No bruits or masses. Good bowel sounds. Extremities: No cyanosis, clubbing, rash, edema Neuro: Alert & oriented x 3, cranial nerves grossly intact. Moves all 4 extremities w/o difficulty. Affect pleasant.  Assessment/Plan: 1. Chronic systolic CHF:  Nonischemic cardiomyopathy.  Echo in 1/21 showed EF down to 30-35% with septal-lateral dyssynchrony.  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  Based on the timeline, the most likely cause for fall in EF seems to be Herceptin use.  She is now off Herceptin, but echo in 3/21 showed EF still in the 30-35% range.  LHC/RHC in 4/21 showed low filling pressures, preserved cardiac output, and no significant CAD.  Cardiac MRI in 5/21 showed EF 36%, no LGE (no evidence for infiltrative disease). Of note, brother died of sudden death so also consider familial cardiomyopathy.  Echo in 10/21 showed EF up a bit to 40-45% with dyssynchrony due to LBBB.  Echo in 4/22 showed EF 45%, septal-lateral dyssynchrony. Echo in 4/23 showed EF 45% with septal-lateral dyssynchrony.  NYHA class II-early III symptoms, fatigue and orthostasis appear major limiter.  She does not appear markedly volume overloaded on exam, however ReDs 43% and weight is up 11 lbs.  - VS today are not orthostatic. - Will give Lasix 40 mg daily + 20 KCL daily x 3 days, then can use PRN  thereafter to see if symptoms improve. BMET and BNP today. - Continue lower doses of Entresto at 24/26 bid and Coreg at 6.25 mg bid.  - Continue spironolactone 25 mg qhs.  - Continue Jardiance.  - She will not be getting any more Herceptin.   - EF is now out of range for CRT-D.  - Update echo. 2. 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  - Follow closely with use of Coreg.   3. LBBB: Chronic.  ?LBBB cardiomyopathy, though QRS today was not markedly prolonged today (126 msec).   4. Breast cancer: She has completed Herceptin. 5. Snoring and daytime fatigue: She snores, had a sleep study "years ago" and it was negative. - Arrange home sleep study.  6. Fatigue: worse recently. Likely multifactorial. - check iron studies today. - As above, arrange home sleep study - Update echo.  Followup in 6 months with Dr. Shirlee Latch.  Anderson Malta [facility] FNP-BC 10/16/2022

## 2022-10-16 ENCOUNTER — Encounter (HOSPITAL_COMMUNITY): Payer: Self-pay

## 2022-10-16 ENCOUNTER — Encounter (HOSPITAL_COMMUNITY): Payer: Self-pay | Admitting: Cardiology

## 2022-10-16 ENCOUNTER — Ambulatory Visit (HOSPITAL_COMMUNITY)
Admission: RE | Admit: 2022-10-16 | Discharge: 2022-10-16 | Disposition: A | Discharge status: Home / Self Care | Payer: PPO | Source: Ambulatory Visit | Attending: Cardiology | Admitting: Cardiology

## 2022-10-16 VITALS — BP 128/80 | HR 84 | Wt 135.6 lb

## 2022-10-16 DIAGNOSIS — I110 Hypertensive heart disease with heart failure: Secondary | ICD-10-CM | POA: Diagnosis not present

## 2022-10-16 DIAGNOSIS — R0683 Snoring: Secondary | ICD-10-CM | POA: Diagnosis not present

## 2022-10-16 DIAGNOSIS — I5022 Chronic systolic (congestive) heart failure: Secondary | ICD-10-CM | POA: Diagnosis not present

## 2022-10-16 DIAGNOSIS — R5383 Other fatigue: Secondary | ICD-10-CM

## 2022-10-16 DIAGNOSIS — C50919 Malignant neoplasm of unspecified site of unspecified female breast: Secondary | ICD-10-CM | POA: Diagnosis not present

## 2022-10-16 DIAGNOSIS — I447 Left bundle-branch block, unspecified: Secondary | ICD-10-CM

## 2022-10-16 DIAGNOSIS — I428 Other cardiomyopathies: Secondary | ICD-10-CM | POA: Insufficient documentation

## 2022-10-16 DIAGNOSIS — I442 Atrioventricular block, complete: Secondary | ICD-10-CM | POA: Diagnosis not present

## 2022-10-16 DIAGNOSIS — Z79899 Other long term (current) drug therapy: Secondary | ICD-10-CM | POA: Insufficient documentation

## 2022-10-16 LAB — CBC
HCT: 38.6 % (ref 36.0–46.0)
Hemoglobin: 12.9 g/dL (ref 12.0–15.0)
MCH: 29.9 pg (ref 26.0–34.0)
MCHC: 33.4 g/dL (ref 30.0–36.0)
MCV: 89.4 fL (ref 80.0–100.0)
Platelets: 498 10*3/uL — ABNORMAL HIGH (ref 150–400)
RBC: 4.32 MIL/uL (ref 3.87–5.11)
RDW: 13.8 % (ref 11.5–15.5)
WBC: 15.1 10*3/uL — ABNORMAL HIGH (ref 4.0–10.5)
nRBC: 0 % (ref 0.0–0.2)

## 2022-10-16 LAB — BASIC METABOLIC PANEL
Anion gap: 9 (ref 5–15)
BUN: 17 mg/dL (ref 8–23)
CO2: 26 mmol/L (ref 22–32)
Calcium: 9.9 mg/dL (ref 8.9–10.3)
Chloride: 104 mmol/L (ref 98–111)
Creatinine, Ser: 0.48 mg/dL (ref 0.44–1.00)
GFR, Estimated: >60 mL/min (ref >60)
Glucose, Bld: 102 mg/dL — ABNORMAL HIGH (ref 70–99)
Potassium: 4.6 mmol/L (ref 3.5–5.1)
Sodium: 139 mmol/L (ref 135–145)

## 2022-10-16 LAB — IRON AND TIBC
Iron: 124 ug/dL (ref 28–170)
Saturation Ratios: 31 % (ref 10.4–31.8)
TIBC: 398 ug/dL (ref 250–450)
UIBC: 274 ug/dL

## 2022-10-16 LAB — FERRITIN: Ferritin: 22 ng/mL (ref 11–307)

## 2022-10-16 LAB — BRAIN NATRIURETIC PEPTIDE: B Natriuretic Peptide: 42.7 pg/mL (ref 0.0–100.0)

## 2022-10-16 MED ORDER — POTASSIUM CHLORIDE CRYS ER 20 MEQ PO TBCR: 20.0000 meq | EXTENDED_RELEASE_TABLET | Freq: Every day | ORAL | 0 refills | Status: DC

## 2022-10-16 NOTE — Progress Notes (Signed)
Patient Name: Margaret Norman        DOB: [birth year]      Height:  5'2    Weight:135.6lbs  Office Name: [facility]         Referring Provider: Dr. Marca Ancona  Today's Date: 10/16/2022  Date:  10/16/2022 STOP BANG RISK ASSESSMENT S (snore) Have you been told that you snore?     YES   T (tired) Are you often tired, fatigued, or sleepy during the day?   YES  O (obstruction) Do you stop breathing, choke, or gasp during sleep? NO   P (pressure) Do you have or are you being treated for high blood pressure? YES   B (BMI) Is your body index greater than 35 kg/m? NO   A (age) Are you 78 years old or older? YES   N (neck) Do you have a neck circumference greater than 16 inches?   YES/NO   G (gender) Are you a female? NO   TOTAL STOP/BANG "YES" ANSWERS 4                                                                       For Office Use Only              Procedure Order Form    YES to 3+ Stop Bang questions OR two clinical symptoms - patient qualifies for WatchPAT (CPT 95800)             Clinical Notes: Will consult Sleep Specialist and refer for management of therapy due to patient increased risk of Sleep Apnea. Ordering a sleep study due to the following two clinical symptoms: Excessive daytime sleepiness G47.10 / Gastroesophageal reflux K21.9 / Nocturia R35.1 / Morning Headaches G44.221 / Difficulty concentrating R41.840 / Memory problems or poor judgment G31.84 / Personality changes or irritability R45.4 / Loud snoring R06.83 / Depression F32.9 / Unrefreshed by sleep G47.8 / Impotence N52.9 / History of high blood pressure R03.0 / Insomnia G47.00    I understand that I am proceeding with a home sleep apnea test as ordered by my treating physician. I understand that untreated sleep apnea is a serious cardiovascular risk factor and it is my responsibility to perform the test and seek management for sleep apnea. I will be contacted with the results and be  managed for sleep apnea by a local sleep physician. I will be receiving equipment and further instructions from [facility]. I shall promptly ship back the equipment via the included mailing label. I understand my insurance will be billed for the test and as the patient I am responsible for any insurance related out-of-pocket costs incurred. I have been provided with written instructions and can call for additional video or telephonic instruction, with 24-hour availability of qualified personnel to answer any questions: Patient Help Desk [phone].  Patient Signature ______________________________________________________   Date______________________ Patient Telemedicine Verbal Consent

## 2022-10-16 NOTE — Addendum Note (Signed)
Encounter addended by: Chinita Pester, CMA on: 10/16/2022 10:52 AM  Actions taken: Clinical Note Signed

## 2022-10-16 NOTE — Patient Instructions (Addendum)
RedsClip done today.  Labs done today. We will contact you only if your labs are abnormal.  INCREASE Lasix to  (2 tablets) by daily for 3 days.  START Potassium (1 tablet) by mouth daily for 3 days only.   No other medication changes were made. Please continue all current medications as prescribed.  Your physician recommends that you schedule a follow-up appointment soon for an echo and in 6 months with Dr. Shirlee Latch. Please contact our office in August to schedule a October appointment.   Your physician has requested that you have an echocardiogram. Echocardiography is a painless test that uses sound waves to create images of your heart. It provides your doctor with information about the size and shape of your heart and how well your heart's chambers and valves are working. This procedure takes approximately one hour. There are no restrictions for this procedure. Please do NOT wear cologne, perfume, aftershave, or lotions (deodorant is allowed). Please arrive 15 minutes prior to your appointment time.  If you have any questions or concerns before your next appointment please send [place] a message through [place] or call our office at [phone].    TO LEAVE A MESSAGE FOR THE NURSE SELECT OPTION 2, PLEASE LEAVE A MESSAGE INCLUDING: YOUR NAME DATE OF BIRTH CALL BACK NUMBER REASON FOR CALL**this is important as we prioritize the call backs  YOU WILL RECEIVE A CALL BACK THE SAME DAY AS LONG AS YOU CALL BEFORE 4:00 PM   Do the following things EVERYDAY: Weigh yourself in the morning before breakfast. Write it down and keep it in a log. Take your medicines as prescribed Eat low salt foods--Limit salt (sodium) to 2000 mg per day.  Stay as active as you can everyday Limit all fluids for the day to less than 2 liters   At [facility], you and your health needs are our priority. As part of our continuing mission to provide you with exceptional heart care, we have  created designated Provider Care Teams. These Care Teams include your primary Cardiologist (physician) and Advanced Practice Providers (APPs- Physician Assistants and Nurse Practitioners) who all work together to provide you with the care you need, when you need it.   You may see any of the following providers on your designated Care Team at your next follow up: Dr Arvilla Meres Dr Marca Ancona Dr. Marcos Eke, NP Robbie Lis, [place] [facility] [place], [place] Brynda Peon, NP Karle Plumber, PharmD   Please be sure to bring in all your medications bottles to every appointment.    Thank you for choosing [facility]

## 2022-10-16 NOTE — Progress Notes (Signed)
ReDS Vest / Clip - 10/16/22 0900       ReDS Vest / Clip   Station Marker A    Ruler Value 34    ReDS Value Range High volume overload    ReDS Actual Value 43    Anatomical Comments sititng

## 2022-10-17 ENCOUNTER — Other Ambulatory Visit (HOSPITAL_COMMUNITY): Payer: Self-pay

## 2022-10-17 MED ORDER — FUROSEMIDE 40 MG PO TABS: 40.0000 mg | ORAL_TABLET | Freq: Every day | ORAL | 3 refills | Status: DC

## 2022-10-30 ENCOUNTER — Other Ambulatory Visit (HOSPITAL_COMMUNITY): Payer: Self-pay | Admitting: Cardiology

## 2022-11-03 ENCOUNTER — Other Ambulatory Visit: Payer: Self-pay | Admitting: Physician Assistant

## 2022-11-06 ENCOUNTER — Ambulatory Visit (HOSPITAL_COMMUNITY)
Admission: RE | Admit: 2022-11-06 | Discharge: 2022-11-06 | Disposition: A | Discharge status: Home / Self Care | Payer: PPO | Source: Ambulatory Visit | Attending: Family | Admitting: Family

## 2022-11-06 DIAGNOSIS — E785 Hyperlipidemia, unspecified: Secondary | ICD-10-CM | POA: Insufficient documentation

## 2022-11-06 DIAGNOSIS — R079 Chest pain, unspecified: Secondary | ICD-10-CM | POA: Insufficient documentation

## 2022-11-06 DIAGNOSIS — K219 Gastro-esophageal reflux disease without esophagitis: Secondary | ICD-10-CM | POA: Insufficient documentation

## 2022-11-06 DIAGNOSIS — E119 Type 2 diabetes mellitus without complications: Secondary | ICD-10-CM | POA: Diagnosis not present

## 2022-11-06 DIAGNOSIS — I447 Left bundle-branch block, unspecified: Secondary | ICD-10-CM | POA: Diagnosis not present

## 2022-11-06 DIAGNOSIS — I110 Hypertensive heart disease with heart failure: Secondary | ICD-10-CM | POA: Insufficient documentation

## 2022-11-06 DIAGNOSIS — I5022 Chronic systolic (congestive) heart failure: Secondary | ICD-10-CM | POA: Diagnosis not present

## 2022-11-06 DIAGNOSIS — I509 Heart failure, unspecified: Secondary | ICD-10-CM | POA: Diagnosis not present

## 2022-11-06 LAB — ECHOCARDIOGRAM COMPLETE
Area-P 1/2: 3.16 cm2
Calc EF: 45.5 %
S' Lateral: 3 cm
Single Plane A2C EF: 45.8 %
Single Plane A4C EF: 46 %

## 2022-11-06 NOTE — Progress Notes (Signed)
Echocardiogram 2D Echocardiogram has been performed.  Margaret Norman 11/06/2022, 9:42 AM

## 2022-11-09 ENCOUNTER — Other Ambulatory Visit: Payer: Self-pay | Admitting: Internal Medicine

## 2022-12-03 ENCOUNTER — Other Ambulatory Visit (HOSPITAL_COMMUNITY): Payer: Self-pay | Admitting: Cardiology

## 2022-12-03 ENCOUNTER — Other Ambulatory Visit: Payer: Self-pay | Admitting: Family

## 2022-12-18 ENCOUNTER — Encounter: Payer: Self-pay | Admitting: Family

## 2022-12-18 ENCOUNTER — Ambulatory Visit (INDEPENDENT_AMBULATORY_CARE_PROVIDER_SITE_OTHER): Payer: PPO | Admitting: *Deleted

## 2022-12-18 DIAGNOSIS — Z0000 Encounter for general adult medical examination without abnormal findings: Secondary | ICD-10-CM

## 2022-12-18 NOTE — Progress Notes (Signed)
Subjective:   Margaret Norman is a 72 y.o. female who presents for Medicare Annual (Subsequent) preventive examination.  Visit Complete: Virtual  I connected with  Margaret Norman on 12/18/22 by a audio enabled telemedicine application and verified that I am speaking with the correct person using two identifiers.  Patient Location: Home  Provider Location: Office/Clinic  I discussed the limitations of evaluation and management by telemedicine. The patient expressed understanding and agreed to proceed.   Review of Systems     Cardiac Risk Factors include: advanced age (>48men, >36 women);diabetes mellitus;dyslipidemia;hypertension     Objective:    Today's Vitals   There is no height or weight on file to calculate BMI.     12/18/2022    9:03 AM 04/09/2022   10:09 AM 12/13/2021    1:58 PM 06/30/2021    1:36 PM 04/25/2020   10:33 AM 04/14/2020    1:40 PM 03/16/2020   11:52 AM  Advanced Directives  Does Patient Have a Medical Advance Directive? No No No No No No No  Would patient like information on creating a medical advance directive? No - Patient declined No - Patient declined No - Patient declined No - Patient declined No - Patient declined      Current Medications (verified) Outpatient Encounter Medications as of 12/18/2022  Medication Sig   acetaminophen (TYLENOL) 500 MG tablet Take 1,000 mg by mouth every 6 (six) hours as needed for moderate pain.   anastrozole (ARIMIDEX) 1 MG tablet Take 1 tablet (1 mg total) by mouth daily.   aspirin 81 MG EC tablet Take 81 mg by mouth daily.   Blood Glucose Monitoring Suppl (ONETOUCH VERIO FLEX SYSTEM) W/DEVICE KIT 1 kit by Does not apply route 3 (three) times daily.   buPROPion (WELLBUTRIN XL) 150 MG 24 hr tablet TAKE 1 TABLET BY MOUTH EVERY MORNING   carvedilol (COREG) 6.25 MG tablet Take 1 tablet (6.25 mg total) by mouth 2 (two) times daily with a meal.   Cholecalciferol (VITAMIN D) 50 MCG (2000 UT) tablet Take 2,000 Units by  mouth daily.   Continuous Blood Gluc Receiver (FREESTYLE LIBRE 2 READER) DEVI 1 each by Does not apply route daily.   Continuous Blood Gluc Sensor (FREESTYLE LIBRE 2 SENSOR) MISC APPLY AS DIRECTED EVERY 14 DAYS   ENTRESTO 24-26 MG TAKE 1 TABLET BY MOUTH TWICE DAILY   fluticasone (FLONASE) 50 MCG/ACT nasal spray Place 2 sprays into both nostrils daily.   furosemide (LASIX) 40 MG tablet Take 1 tablet (40 mg total) by mouth daily.   glucose blood (ONETOUCH VERIO) test strip Test three times daily as directed. DX: E11.9   Insulin Pen Needle 32G X 4 MM MISC 1 each by Other route daily. E11.9; THIS IS THE CORRECT PEN NEEDLE. PLEASE FILL RX AS ORDERED   JARDIANCE 25 MG TABS tablet TAKE 1 TABLET BY MOUTH DAILY   LANTUS SOLOSTAR 100 UNIT/ML Solostar Pen INJECT SUBCUTANEOUSLY 18-20  UNITS AT BEDTIME   meclizine (ANTIVERT) 25 MG tablet Take 25 mg by mouth 3 (three) times daily as needed for dizziness.   metFORMIN (GLUCOPHAGE) 1000 MG tablet Take 1 tablet (1,000 mg total) by mouth 2 (two) times daily with a meal.   omeprazole (PRILOSEC) 40 MG capsule Take 1 capsule (40 mg total) by mouth 2 (two) times daily. Please call [phone] to schedule an office visit for more refills   OneTouch Delica Lancets 30G MISC 1 each by Does not apply route daily. E11.9  Probiotic Product (PROBIOTIC ADVANCED PO) Take 1 tablet by mouth at bedtime.    rosuvastatin (CRESTOR) 10 MG tablet Take 1 tablet (10 mg total) by mouth daily.   sertraline (ZOLOFT) 100 MG tablet TAKE 1 TABLET BY MOUTH DAILY   spironolactone (ALDACTONE) 25 MG tablet TAKE 1 TABLET BY MOUTH DAILY   TRULICITY 3 MG/0.5ML SOPN INJECT 3 MG (0.5 ML) UNDER THE SKIN ONCE A WEEK   vitamin B-12 (CYANOCOBALAMIN) 100 MCG tablet Take 100 mcg by mouth daily.   Vitamin E 180 MG CAPS Take 180 mg by mouth at bedtime.    potassium chloride SA (KLOR-CON M20) 20 MEQ tablet Take 1 tablet (20 mEq total) by mouth daily for 3 days.   [DISCONTINUED] benzonatate (TESSALON) 100 MG  capsule Take 1 capsule (100 mg total) by mouth 3 (three) times daily as needed.   [DISCONTINUED] HYDROcodone bit-homatropine (HYCODAN) 5-1.5 MG/5ML syrup Take 5 mLs by mouth every 8 (eight) hours as needed for cough.   No facility-administered encounter medications on file as of 12/18/2022.    Allergies (verified) Invokana [canagliflozin] and Amoxicillin   History: Past Medical History:  Diagnosis Date   Allergy AMOXICILLAN   Arthritis    Chest pain    Nuclear, adenosine,  December, 2013, low risk nuclear scan with small, moderate in intensity, fixed anteroseptal defect. This is possibly related to an LBBB versus small prior infarct. 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 w/appy   CHOLECYSTECTOMY     ESOPHAGOGASTRODUODENOSCOPY     HERNIA REPAIR     KNEE ARTHROSCOPY Right 11/2005   LIVER BIOPSY     PANCREATIC CYST EXCISION     PORT-A-CATH REMOVAL Left 04/25/2020   Procedure: REMOVAL PORT-A-CATH;  Surgeon: Abigail Miyamoto, MD;  Location: [facility];  Service: General;  Laterality: Left;   PORTACATH  PLACEMENT Left 04/15/2019   Procedure: INSERTION PORT-A-CATH WITH ULTRASOUND;  Surgeon: Abigail Miyamoto, MD;  Location: [facility] OR;  Service: General;  Laterality: Left;   RIGHT/LEFT HEART CATH AND CORONARY ANGIOGRAPHY N/A 09/28/2019   Procedure: RIGHT/LEFT HEART CATH AND CORONARY ANGIOGRAPHY;  Surgeon: Laurey Morale, MD;  Location: [facility];  Service: Cardiovascular;  Laterality: N/A;   SPLENECTOMY     TONSILLECTOMY AND ADENOIDECTOMY     TUBAL LIGATION  06/24/78   Family History  Problem Relation Age of Onset   Liver disease Mother    Dementia Mother    Diabetes Mother        borderline   Depression Mother  Coronary artery disease Father    Heart attack Father    Hypertension Father    Heart disease Father    Early death Father    Cancer Other        leukemia   Stroke Maternal Grandfather    Alcohol abuse Maternal Grandfather    Hyperlipidemia Brother        Amyloidosis   Cancer Brother    Hypertension Brother    Diabetes Maternal Grandmother    Cancer Paternal Uncle        unknown   Heart attack Paternal Grandmother    Heart attack Paternal Uncle    Hypertension Brother    Diabetes Daughter        borderline   Diabetes Sister    Heart disease Sister    Kidney disease Sister    Heart disease Brother    Colon cancer Neg Hx    Esophageal cancer Neg Hx    Rectal cancer Neg Hx    Stomach cancer Neg Hx    Social History   Socioeconomic History   Marital status: Married    Spouse name: Dynasti Kerman   Number of children: 2   Years of education: Not on file   Highest education level: Not on file  Occupational History   Occupation: CSR    Employer: [employer]   Occupation: retired  Tobacco Use   Smoking status: Never   Smokeless tobacco: Never  Vaping Use   Vaping Use: Never used  Substance and Sexual Activity   Alcohol use: No   Drug use: No   Sexual activity: Not Currently    Birth control/protection: Pill    Comment: Tubes tied   Other Topics Concern   Not on file  Social History Narrative   Regular Exercise -  NO   Social Determinants of Health   Financial Resource Strain: Low Risk  (12/18/2022)   Overall Financial Resource Strain (CARDIA)    Difficulty of Paying Living Expenses: Not very hard  Food Insecurity: No Food Insecurity (12/18/2022)   Hunger Vital Sign    Worried About Running Out of Food in the Last Year: Never true    Ran Out of Food in the Last Year: Never true  Transportation Needs: No Transportation Needs (12/18/2022)   PRAPARE - Administrator, Civil Service (Medical): No    Lack of Transportation (Non-Medical): No  Physical Activity: Inactive (12/18/2022)   Exercise Vital Sign    Days of Exercise per Week: 0 days    Minutes of Exercise per Session: 0 min  Stress: Stress Concern Present (12/18/2022)   [employer] of Occupational Health - Occupational Stress Questionnaire    Feeling of Stress : Rather much  Social Connections: Moderately Integrated (12/18/2022)   Social Connection and Isolation Panel [NHANES]    Frequency of Communication with Friends and Family: More than three times a week    Frequency of Social Gatherings with Friends and Family: Once a week    Attends Religious Services: 1 to 4 times per year    Active Member of [employer] or Organizations: No    Attends Engineer, structural: Never    Marital Status: Married    Tobacco Counseling Counseling given: Not Answered   Clinical Intake:  Pre-visit preparation completed: Yes  Pain : No/denies pain   Nutritional Risks: None Diabetes: Yes CBG done?: No Did pt. bring in CBG monitor from home?: No  How often do you need to have  someone help you when you read instructions, pamphlets, or other written materials from your doctor or pharmacy?: 1 - Never  Interpreter Needed?: No  Information entered by :: Donne Anon, CMA   Activities of Daily Living    12/18/2022    9:03 AM  In your present  state of health, do you have any difficulty performing the following activities:  Hearing? 0  Vision? 1  Difficulty concentrating or making decisions? 1  Walking or climbing stairs? 1  Dressing or bathing? 0  Doing errands, shopping? 0  Preparing Food and eating ? N  Using the Toilet? N  In the past six months, have you accidently leaked urine? N  Do you have problems with loss of bowel control? N  Managing your Medications? N  Managing your Finances? N  Housekeeping or managing your Housekeeping? N    Patient Care Team: Olive Bass, FNP as PCP - General (Internal Medicine) [name], Deloris Ping, MD as PCP - Cardiology (Cardiology) Laurey Morale, MD as PCP - Advanced Heart Failure (Cardiology) Pershing Proud, RN as Oncology Nurse Navigator Donnelly Angelica, RN as Oncology Nurse Navigator  Indicate any recent Medical Services you may have received from other than [facility] providers in the past year (date may be approximate).     Assessment:   This is a routine wellness examination for Latresha.  Hearing/Vision screen No results found.  Dietary issues and exercise activities discussed:     Goals Addressed   None    Depression Screen    12/18/2022    9:08 AM 02/20/2022    1:09 PM 12/13/2021    1:59 PM 07/07/2021    3:29 PM 04/28/2021   10:12 AM 08/23/2020    9:16 AM 01/28/2020    1:30 PM  PHQ 2/9 Scores  PHQ - 2 Score 4 0 0 0 0 2 0  PHQ- 9 Score 14     5     Fall Risk    12/18/2022    9:13 AM 02/20/2022    1:09 PM 12/13/2021    1:58 PM 07/07/2021    3:29 PM 04/28/2021   10:12 AM  Fall Risk   Falls in the past year? 1 0 1 0 0  Number falls in past yr: 1 0 0 0 0  Injury with Fall? 0 0 0 0 0  Risk for fall due to : Impaired balance/gait No Fall Risks History of fall(s) No Fall Risks No Fall Risks  Follow up Falls evaluation completed;Education provided Falls evaluation completed Falls evaluation completed Falls evaluation completed Falls evaluation completed     MEDICARE RISK AT HOME:  Medicare Risk at Home - 12/18/22 0905     Any stairs in or around the home? Yes    If so, are there any without handrails? No   also has a ramp   Home free of loose throw rugs in walkways, pet beds, electrical cords, etc? Yes    Adequate lighting in your home to reduce risk of falls? Yes    Life alert? No    Use of a cane, walker or w/c? Yes    Grab bars in the bathroom? No    Shower chair or bench in shower? No    Elevated toilet seat or a handicapped toilet? No             TIMED UP AND GO:  Was the test performed?  No    Cognitive Function:  12/18/2022    9:14 AM 12/13/2021    2:05 PM 02/02/2019   10:03 AM  6CIT Screen  What Year? 0 points 0 points 0 points  What month? 0 points 0 points 0 points  What time? 0 points 0 points 0 points  Count back from 20 0 points 0 points 0 points  Months in reverse 0 points 4 points 0 points  Repeat phrase 0 points 0 points 0 points  Total Score 0 points 4 points 0 points    Immunizations Immunization History  Administered Date(s) Administered   Fluad Quad(high Dose 65+) 02/18/2019, 07/01/2020, 04/28/2021, 04/11/2022   Influenza Split 04/09/2011, 06/12/2012   Influenza Whole 04/05/2009   Influenza, High Dose Seasonal PF 04/30/2016, 03/12/2017, 03/04/2018   Influenza,inj,Quad PF,6+ Mos 04/09/2013, 05/14/2014, 04/26/2015   Meningococcal polysaccharide vaccine (MPSV4) 08/31/2008   PFIZER(Purple Top)SARS-COV-2 Vaccination 08/30/2019, 10/03/2019   Pneumococcal Conjugate-13 09/13/2014   Pneumococcal Polysaccharide-23 08/31/2008, 12/29/2015   Td 03/26/1999, 10/18/2008   Tdap 02/02/2019   Zoster, Live 12/23/2014    TDAP status: Up to date  Flu Vaccine status: Up to date  Pneumococcal vaccine status: Up to date  Covid-19 vaccine status: Information provided on how to obtain vaccines.   Qualifies for Shingles Vaccine? Yes   Zostavax completed Yes   Shingrix Completed?: No.    Education has  been provided regarding the importance of this vaccine. Patient has been advised to call insurance company to determine out of pocket expense if they have not yet received this vaccine. Advised may also receive vaccine at local pharmacy or Health Dept. Verbalized acceptance and understanding.  Screening Tests Health Maintenance  Topic Date Due   Zoster Vaccines- Shingrix (1 of 2) Never done   COVID-19 Vaccine (3 - Pfizer risk series) 10/31/2019   Diabetic kidney evaluation - Urine ACR  08/23/2021   OPHTHALMOLOGY EXAM  10/27/2022   Medicare Annual Wellness (AWV)  12/14/2022   INFLUENZA VACCINE  01/24/2023   HEMOGLOBIN A1C  03/28/2023   FOOT EXAM  09/26/2023   Diabetic kidney evaluation - eGFR measurement  10/16/2023   Colonoscopy  03/19/2024   MAMMOGRAM  07/11/2024   DTaP/Tdap/Td (4 - Td or Tdap) 02/01/2029   Pneumonia Vaccine 23+ Years old  Completed   DEXA SCAN  Completed   Hepatitis C Screening  Completed   HPV VACCINES  Aged Out    Health Maintenance  Health Maintenance Due  Topic Date Due   Zoster Vaccines- Shingrix (1 of 2) Never done   COVID-19 Vaccine (3 - Pfizer risk series) 10/31/2019   Diabetic kidney evaluation - Urine ACR  08/23/2021   OPHTHALMOLOGY EXAM  10/27/2022   Medicare Annual Wellness (AWV)  12/14/2022    Colorectal cancer screening: Type of screening: Colonoscopy. Completed 03/19/14. Repeat every 10 years  Mammogram status: Completed 07/11/22. Repeat every year  Bone Density status: Completed 05/31/20. Results reflect: Bone density results: OSTEOPENIA. Repeat every 2 years.  Lung Cancer Screening: (Low Dose CT Chest recommended if Age 12-80 years, 20 pack-year currently smoking OR have quit w/in 15years.) does not qualify.   Additional Screening:  Hepatitis C Screening: does qualify; Completed 04/26/15  Vision Screening: Recommended annual ophthalmology exams for early detection of glaucoma and other disorders of the eye. Is the patient up to date with  their annual eye exam?  No  Who is the provider or what is the name of the office in which the patient attends annual eye exams? Changing to [facility], has new pt appt on  12/20/22 If pt is not established with a provider, would they like to be referred to a provider to establish care? No .   Dental Screening: Recommended annual dental exams for proper oral hygiene  Diabetic Foot Exam: Diabetic Foot Exam: Completed 09/26/22  Community Resource Referral / Chronic Care Management: CRR required this visit?  No   CCM required this visit?  No     Plan:     I have personally reviewed and noted the following in the patient's chart:   Medical and social history Use of alcohol, tobacco or illicit drugs  Current medications and supplements including opioid prescriptions. Patient is not currently taking opioid prescriptions. Functional ability and status Nutritional status Physical activity Advanced directives List of other physicians Hospitalizations, surgeries, and ER visits in previous 12 months Vitals Screenings to include cognitive, depression, and falls Referrals and appointments  In addition, I have reviewed and discussed with patient certain preventive protocols, quality metrics, and best practice recommendations. A written personalized care plan for preventive services as well as general preventive health recommendations were provided to patient.     Donne Anon, CMA   12/18/2022   After Visit Summary: (MyChart) Due to this being a telephonic visit, the after visit summary with patients personalized plan was offered to patient via MyChart   Nurse Notes: None

## 2022-12-18 NOTE — Patient Instructions (Signed)
[name] , Thank you for taking time to come for your Medicare Wellness Visit. I appreciate your ongoing commitment to your health goals. Please review the following plan we discussed and let me know if I can assist you in the future.     This is a list of the screening recommended for you and due dates:  Health Maintenance  Topic Date Due   Zoster (Shingles) Vaccine (1 of 2) Never done   COVID-19 Vaccine (3 - Pfizer risk series) 10/31/2019   Yearly kidney health urinalysis for diabetes  08/23/2021   Eye exam for diabetics  10/27/2022   Flu Shot  01/24/2023   Hemoglobin A1C  03/28/2023   Complete foot exam   09/26/2023   Yearly kidney function blood test for diabetes  10/16/2023   Medicare Annual Wellness Visit  12/18/2023   Colon Cancer Screening  03/19/2024   Mammogram  07/11/2024   DTaP/Tdap/Td vaccine (4 - Td or Tdap) 02/01/2029   Pneumonia Vaccine  Completed   DEXA scan (bone density measurement)  Completed   Hepatitis C Screening  Completed   HPV Vaccine  Aged Out    Next appointment: Follow up in one year for your annual wellness visit.   Preventive Care 50 Years and Older, Female Preventive care refers to lifestyle choices and visits with your health care provider that can promote health and wellness. What does preventive care include? A yearly physical exam. This is also called an annual well check. Dental exams once or twice a year. Routine eye exams. Ask your health care provider how often you should have your eyes checked. Personal lifestyle choices, including: Daily care of your teeth and gums. Regular physical activity. Eating a healthy diet. Avoiding tobacco and drug use. Limiting alcohol use. Practicing safe sex. Taking low-dose aspirin every day. Taking vitamin and mineral supplements as recommended by your health care provider. What happens during an annual well check? The services and screenings done by your health care provider during your annual  well check will depend on your age, overall health, lifestyle risk factors, and family history of disease. Counseling  Your health care provider may ask you questions about your: Alcohol use. Tobacco use. Drug use. Emotional well-being. Home and relationship well-being. Sexual activity. Eating habits. History of falls. Memory and ability to understand (cognition). Work and work Astronomer. Reproductive health. Screening  You may have the following tests or measurements: Height, weight, and BMI. Blood pressure. Lipid and cholesterol levels. These may be checked every 5 years, or more frequently if you are over 50 years old. Skin check. Lung cancer screening. You may have this screening every year starting at age 42 if you have a 30-pack-year history of smoking and currently smoke or have quit within the past 15 years. Fecal occult blood test (FOBT) of the stool. You may have this test every year starting at age 65. Flexible sigmoidoscopy or colonoscopy. You may have a sigmoidoscopy every 5 years or a colonoscopy every 10 years starting at age 84. Hepatitis C blood test. Hepatitis B blood test. Sexually transmitted disease (STD) testing. Diabetes screening. This is done by checking your blood sugar (glucose) after you have not eaten for a while (fasting). You may have this done every 1-3 years. Bone density scan. This is done to screen for osteoporosis. You may have this done starting at age 83. Mammogram. This may be done every 1-2 years. Talk to your health care provider about how often you should have regular mammograms.  Talk with your health care provider about your test results, treatment options, and if necessary, the need for more tests. Vaccines  Your health care provider may recommend certain vaccines, such as: Influenza vaccine. This is recommended every year. Tetanus, diphtheria, and acellular pertussis (Tdap, Td) vaccine. You may need a Td booster every 10 years. Zoster  vaccine. You may need this after age 34. Pneumococcal 13-valent conjugate (PCV13) vaccine. One dose is recommended after age 69. Pneumococcal polysaccharide (PPSV23) vaccine. One dose is recommended after age 61. Talk to your health care provider about which screenings and vaccines you need and how often you need them. This information is not intended to replace advice given to you by your health care provider. Make sure you discuss any questions you have with your health care provider. Document Released: 07/08/2015 Document Revised: 02/29/2016 Document Reviewed: 04/12/2015 Elsevier Interactive Patient Education  2017 [place] Prevention in the Home Falls can cause injuries. They can happen to people of all ages. There are many things you can do to make your home safe and to help prevent falls. What can I do on the outside of my home? Regularly fix the edges of walkways and driveways and fix any cracks. Remove anything that might make you trip as you walk through a door, such as a raised step or threshold. Trim any bushes or trees on the path to your home. Use bright outdoor lighting. Clear any walking paths of anything that might make someone trip, such as rocks or tools. Regularly check to see if handrails are loose or broken. Make sure that both sides of any steps have handrails. Any raised decks and porches should have guardrails on the edges. Have any leaves, snow, or ice cleared regularly. Use sand or salt on walking paths during winter. Clean up any spills in your garage right away. This includes oil or grease spills. What can I do in the bathroom? Use night lights. Install grab bars by the toilet and in the tub and shower. Do not use towel bars as grab bars. Use non-skid mats or decals in the tub or shower. If you need to sit down in the shower, use a plastic, non-slip stool. Keep the floor dry. Clean up any water that spills on the floor as soon as it happens. Remove  soap buildup in the tub or shower regularly. Attach bath mats securely with double-sided non-slip rug tape. Do not have throw rugs and other things on the floor that can make you trip. What can I do in the bedroom? Use night lights. Make sure that you have a light by your bed that is easy to reach. Do not use any sheets or blankets that are too big for your bed. They should not hang down onto the floor. Have a firm chair that has side arms. You can use this for support while you get dressed. Do not have throw rugs and other things on the floor that can make you trip. What can I do in the kitchen? Clean up any spills right away. Avoid walking on wet floors. Keep items that you use a lot in easy-to-reach places. If you need to reach something above you, use a strong step stool that has a grab bar. Keep electrical cords out of the way. Do not use floor polish or wax that makes floors slippery. If you must use wax, use non-skid floor wax. Do not have throw rugs and other things on the floor that can make  you trip. What can I do with my stairs? Do not leave any items on the stairs. Make sure that there are handrails on both sides of the stairs and use them. Fix handrails that are broken or loose. Make sure that handrails are as long as the stairways. Check any carpeting to make sure that it is firmly attached to the stairs. Fix any carpet that is loose or worn. Avoid having throw rugs at the top or bottom of the stairs. If you do have throw rugs, attach them to the floor with carpet tape. Make sure that you have a light switch at the top of the stairs and the bottom of the stairs. If you do not have them, ask someone to add them for you. What else can I do to help prevent falls? Wear shoes that: Do not have high heels. Have rubber bottoms. Are comfortable and fit you well. Are closed at the toe. Do not wear sandals. If you use a stepladder: Make sure that it is fully opened. Do not climb a  closed stepladder. Make sure that both sides of the stepladder are locked into place. Ask someone to hold it for you, if possible. Clearly mark and make sure that you can see: Any grab bars or handrails. First and last steps. Where the edge of each step is. Use tools that help you move around (mobility aids) if they are needed. These include: Canes. Walkers. Scooters. Crutches. Turn on the lights when you go into a dark area. Replace any light bulbs as soon as they burn out. Set up your furniture so you have a clear path. Avoid moving your furniture around. If any of your floors are uneven, fix them. If there are any pets around you, be aware of where they are. Review your medicines with your doctor. Some medicines can make you feel dizzy. This can increase your chance of falling. Ask your doctor what other things that you can do to help prevent falls. This information is not intended to replace advice given to you by your health care provider. Make sure you discuss any questions you have with your health care provider. Document Released: 04/07/2009 Document Revised: 11/17/2015 Document Reviewed: 07/16/2014 Elsevier Interactive Patient Education  2017 [employer].

## 2022-12-20 LAB — HM DIABETES EYE EXAM

## 2023-01-28 ENCOUNTER — Encounter (HOSPITAL_COMMUNITY): Payer: Self-pay | Admitting: Cardiology

## 2023-02-12 ENCOUNTER — Encounter: Payer: Self-pay | Admitting: Internal Medicine

## 2023-02-12 ENCOUNTER — Ambulatory Visit: Payer: PPO | Admitting: Internal Medicine

## 2023-02-12 VITALS — BP 124/82 | HR 80 | Ht 62.0 in | Wt 131.0 lb

## 2023-02-12 DIAGNOSIS — E042 Nontoxic multinodular goiter: Secondary | ICD-10-CM | POA: Diagnosis not present

## 2023-02-12 DIAGNOSIS — Z794 Long term (current) use of insulin: Secondary | ICD-10-CM

## 2023-02-12 DIAGNOSIS — Z7984 Long term (current) use of oral hypoglycemic drugs: Secondary | ICD-10-CM | POA: Diagnosis not present

## 2023-02-12 DIAGNOSIS — E118 Type 2 diabetes mellitus with unspecified complications: Secondary | ICD-10-CM

## 2023-02-12 DIAGNOSIS — E785 Hyperlipidemia, unspecified: Secondary | ICD-10-CM

## 2023-02-12 DIAGNOSIS — Z7985 Long-term (current) use of injectable non-insulin antidiabetic drugs: Secondary | ICD-10-CM | POA: Diagnosis not present

## 2023-02-12 DIAGNOSIS — E1165 Type 2 diabetes mellitus with hyperglycemia: Secondary | ICD-10-CM

## 2023-02-12 DIAGNOSIS — E119 Type 2 diabetes mellitus without complications: Secondary | ICD-10-CM | POA: Diagnosis not present

## 2023-02-12 LAB — POCT GLYCOSYLATED HEMOGLOBIN (HGB A1C): Hemoglobin A1C: 6.4 % — AB (ref 4.0–5.6)

## 2023-02-12 MED ORDER — LANTUS SOLOSTAR 100 UNIT/ML ~~LOC~~ SOPN: PEN_INJECTOR | SUBCUTANEOUS | Status: DC

## 2023-02-12 MED ORDER — REPAGLINIDE 1 MG PO TABS: 1.0000 mg | ORAL_TABLET | Freq: Three times a day (TID) | ORAL | 3 refills | Status: DC

## 2023-02-12 NOTE — Progress Notes (Signed)
Patient ID: Margaret Norman, female   DOB: Nov 12, 1950, 72 y.o.   MRN: [id number]   HPI: Margaret Norman is a 72 y.o.-year-old female, returning for follow-up for DM2, dx in 2008, insulin-dependent since 2016, uncontrolled, without long term complications.  Last visit  4 months ago.  Interim history:  No increased urination, blurry vision, chest pain.  She is has dizziness and lightheadedness and a history of vertigo.  She is walking with a cane.  She had 1 fall, but no fractures.  Reviewed HbA1c levels: Lab Results  Component Value Date   HGBA1C 6.4 (A) 09/26/2022   HGBA1C 6.5 (A) 05/23/2022   HGBA1C 6.2 (A) 01/17/2022  03/19/2017: HbA1c calculated from fructosamine is 6.7%! 12/31/2016: HbA1c calculated from fructosamine: 6.9%!  Pt is on: - Metformin 1000 mg 2x a day with meals >> 2000 mg with dinner - Jardiance 25 mg daily before breakfast (PAP) - Trulicity 1.5 >> 3 mg weekly (PAP) - Lantus 30 >> 26 >> 22 >> 18 units at night Stopped Actos when started trulicity back. She was on Trulicity but became expensive >> changed to Actos. She was on Invokana >> recurrent UTIs.  Pt checks her sugars >4x a day:  Previously:  Previously:   Lowest sugar was 73 >> 58 >> 73 >> 63; she has hypoglycemia awareness in the 70s. Highest sugar was 200s >> 200 >> 200s >> 280.  Glucometer: OneTouch Verio  -No CKD, last BUN/creatinine:  Lab Results  Component Value Date   BUN 17 10/16/2022   BUN 14 05/03/2022   CREATININE 0.48 10/16/2022   CREATININE 1.00 05/03/2022  On Entresto.  -+ HL; last set of lipids: Lab Results  Component Value Date   CHOL 109 07/24/2021   HDL 46 07/24/2021   LDLCALC 47 07/24/2021   LDLDIRECT 137.9 02/04/2013   TRIG 78 07/24/2021   CHOLHDL 2.4 07/24/2021  On Crestor 10.  - last eye exam was in 2024: No DR, + cataract OS.  Dr. Blima Ledger. Latest eye exam: [facility].  -+ numbness and tingling in her toes - after chemotx.  She has restless leg syndrome.   Last foot exam 09/26/2022.  Pt has FH of DM in MGM.  Multinodular goiter:   -Detected on palpation in 2021  Thyroid ultrasound (01/26/2020): Parenchymal Echotexture: Moderately heterogenous Isthmus: 0.3 cm Right lobe: 6.2 cm x 2.1 cm x 2.2 cm Left lobe: 7.7 cm x 3.2 cm x 2.8 cm _________________________________________________________   Nodule # 1: Location: Right; Superior Maximum size: 0.9 cm; Other 2 dimensions: 0.7 cm x 0.7 cm Composition: spongiform (0) Spongiform nodule does not meet criteria for surveillance or biopsy  _________________________________________________________   Nodule # 2: Location: Right; Mid Maximum size: 1.0 cm; Other 2 dimensions: 0.9 cm x 0.6 cm Composition: spongiform (0) Spongiform nodule does not meet criteria for surveillance or biopsy  _________________________________________________________   Nodule # 3:  Location: Right; Mid Maximum size: 1.2 cm; Other 2 dimensions: 1.1 cm x 0.7 cm Composition: spongiform (0) Spongiform nodule does not meet criteria for surveillance or biopsy _________________________________________________________   Nodule # 4:  Location: Right; Inferior Maximum size: 1.0 cm; Other 2 dimensions: 0.8 cm x 0.8 cm Composition: spongiform (0) Spongiform nodule does not meet criteria for surveillance or biopsy  ________________________________________________________   Nodule # 5:  Location: Right; Inferior Maximum size: 1.5 cm; Other 2 dimensions: 1.4 cm x 0.8 cm Composition: solid/almost completely solid (2) Echogenicity: isoechoic (1) Nodule meets criteria for surveillance  _________________________________________________________  Nodule # 6: Location: Left; Superior Maximum size: 1.2 cm; Other 2 dimensions: 1.1 cm x 0.9 cm  Composition: cannot determine (2) Echogenicity: isoechoic (1) Echogenic foci: macrocalcifications (1)  Nodule meets criteria for surveillance   _______________________________________________________   Nodule # 7: Location: Left; Inferior Maximum size: 4.7 cm; Other 2 dimensions: 4.7 cm x 2.8 cm Composition: mixed cystic and solid (1) Echogenicity: isoechoic (1) Echogenic foci: macrocalcifications (1) Nodule meets criteria for biopsy  ______________________________________________________   No adenopathy   IMPRESSION: Multinodular thyroid.   Left inferior thyroid nodule (labeled 7, TR , 4.7 cm) meets criteria for biopsy, as designated by the newly established ACR TI-RADS criteria, and referral for biopsy is recommended.   Right inferior thyroid nodule (labeled 5, 1.5 cm, TR 3) and the left superior thyroid nodule (labeled 6, 1.2 cm, TR 4) both meet criteria for surveillance, as designated by the newly established ACR TI-RADS criteria. Surveillance ultrasound study recommended to be performed annually up to 5 years.   FNA left inferior thyroid nodule (02/16/2020): Benign  Thyroid U/S (02/07/2021): Parenchymal Echotexture: Markedly heterogenous Isthmus: 0.6 cm Right lobe: 7.4 cm x 2.0 cm x 2.5 cm Left lobe: 8.4 cm x 3.2 cm x 4.3 cm _________________________________________________________   Estimated total number of nodules >/= 1 cm: 6-10  _________________________________________________________   Nodule labeled 1 superior right thyroid, slightly smaller than previous and spongiform. Nodule does not meet criteria for surveillance or biopsy   Nodule labeled 2, right thyroid, TR 2 with cystic components measures less than 1 cm. Nodule does not meet criteria for surveillance or biopsy   Nodule labeled 3, mid right thyroid. Spongiform characteristics measuring 1.3 cm. Nodule does not meet criteria for surveillance or biopsy.   Nodule labeled 4, mid right thyroid, 9 mm, spongiform characteristics. Nodule does not meet criteria for surveillance or biopsy.   Nodule labeled 5, 0.98 cm, spongiform characteristics  and does not meet criteria for surveillance or biopsy.   Nodule 6, right lower thyroid, 0.96 cm. Nodule has spongiform characteristics and does not meet criteria for surveillance or biopsy.   Nodule 7, right lower thyroid, smaller than previous, now 1.0 cm. Previous nodule was labeled 5, 1.5 cm. Nodule has clearly spongiform characteristics on the current ultrasound, and has decreased in size below 1 cm. Nodule no longer meets criteria for surveillance.   Nodule labeled 8 on the left, upper left thyroid, 9 mm. Nodule has internal cystic change on the current, TR 2 characteristics, and has decreased in size below 1 cm. Nodule no longer meets criteria for continued surveillance.   The nodules at the left inferior thyroid labeled 9, 10, 11, appear to all be part of the previous 4.7 cm nodule which was previously biopsied 02/16/2020, and are doubtful to represent independent nodules given the available images and the comparison.   No adenopathy   Recommendations follow those established by the new ACR TI-RADS criteria (J Am Coll Radiol 2017;14:587-595).   IMPRESSION: Enlarged multinodular thyroid, as above.   Nodule inferior left thyroid has been previously biopsied, as above. Assuming benign result, no further specific follow-up would be indicated.  Pt denies: - feeling nodules in neck - hoarseness - dysphagia - choking  Latest TSH was normal Lab Results  Component Value Date   TSH 1.087 05/03/2022   She sees Cardiology - Dr. Shirlee Latch >> on [place], [place]. She also has a history of breast cancer, s/p chemoradiation therapy. She fractured humerus in 02/2020 after tripping >> this is healing.  She  had a DXA scan >> normal.  She is on Arimidex.  She had lightheadedness and dizziness at last visit.  She was in the emergency room with vertigo 06/30/2021.  ROS: + See HPI  I reviewed pt's medications, allergies, PMH, social hx, family hx, and changes were documented in the  history of present illness. Otherwise, unchanged from my initial visit note.  Past Medical History:  Diagnosis Date   Allergy AMOXICILLAN   Arthritis    Chest pain    Nuclear, adenosine,  December, 2013, low risk nuclear scan with small, moderate in intensity, fixed anteroseptal defect. This is possibly related to an LBBB versus small prior infarct. 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 w/appy   CHOLECYSTECTOMY     ESOPHAGOGASTRODUODENOSCOPY     HERNIA REPAIR     KNEE ARTHROSCOPY Right 11/2005   LIVER BIOPSY     PANCREATIC CYST EXCISION     PORT-A-CATH REMOVAL Left 04/25/2020   Procedure: REMOVAL PORT-A-CATH;  Surgeon: Abigail Miyamoto, MD;  Location: [facility];  Service: General;  Laterality: Left;   PORTACATH PLACEMENT Left 04/15/2019   Procedure: INSERTION PORT-A-CATH WITH ULTRASOUND;  Surgeon: Abigail Miyamoto, MD;  Location: [facility] OR;  Service: General;  Laterality: Left;   RIGHT/LEFT HEART CATH AND CORONARY ANGIOGRAPHY N/A 09/28/2019   Procedure: RIGHT/LEFT HEART CATH AND CORONARY ANGIOGRAPHY;   Surgeon: Laurey Morale, MD;  Location: [facility];  Service: Cardiovascular;  Laterality: N/A;   SPLENECTOMY     TONSILLECTOMY AND ADENOIDECTOMY     TUBAL LIGATION  06/24/78   Social History   Socioeconomic History   Marital status: Married    Spouse name: Camrynn Macbride   Number of children: 2   Years of education: Not on file   Highest education level: Not on file  Occupational History   Occupation: CSR    Employer: [employer]   Occupation: retired  Tobacco Use   Smoking status: Never   Smokeless tobacco: Never  Vaping Use   Vaping status: Never Used  Substance and Sexual Activity   Alcohol use: No   Drug use: No   Sexual activity: Not Currently    Birth control/protection: Pill    Comment: Tubes tied  Other Topics Concern   Not on file  Social History Narrative   Regular Exercise -  NO   Social Determinants of Health   Financial Resource Strain: Low Risk  (12/18/2022)   Overall Financial Resource Strain (CARDIA)    Difficulty of Paying Living Expenses: Not  very hard  Food Insecurity: No Food Insecurity (12/18/2022)   Hunger Vital Sign    Worried About Running Out of Food in the Last Year: Never true    Ran Out of Food in the Last Year: Never true  Transportation Needs: No Transportation Needs (12/18/2022)   PRAPARE - Administrator, Civil Service (Medical): No    Lack of Transportation (Non-Medical): No  Physical Activity: Inactive (12/18/2022)   Exercise Vital Sign    Days of Exercise per Week: 0 days    Minutes of Exercise per Session: 0 min  Stress: Stress Concern Present (12/18/2022)   [employer] of Occupational Health - Occupational Stress Questionnaire    Feeling of Stress : Rather much  Social Connections: Moderately Integrated (12/18/2022)   Social Connection and Isolation Panel [NHANES]    Frequency of Communication with Friends and Family: More than three times a week    Frequency of Social Gatherings with Friends and  Family: Once a week    Attends Religious Services: 1 to 4 times per year    Active Member of [employer] or Organizations: No    Attends Banker Meetings: Never    Marital Status: Married  Catering manager Violence: Not At Risk (12/18/2022)   Humiliation, Afraid, Rape, and Kick questionnaire    Fear of Current or Ex-Partner: No    Emotionally Abused: No    Physically Abused: No    Sexually Abused: No   Current Outpatient Medications on File Prior to Visit  Medication Sig Dispense Refill   acetaminophen (TYLENOL) 500 MG tablet Take 1,000 mg by mouth every 6 (six) hours as needed for moderate pain.     anastrozole (ARIMIDEX) 1 MG tablet Take 1 tablet (1 mg total) by mouth daily. 90 tablet 3   aspirin 81 MG EC tablet Take 81 mg by mouth daily.     Blood Glucose Monitoring Suppl (ONETOUCH VERIO FLEX SYSTEM) W/DEVICE KIT 1 kit by Does not apply route 3 (three) times daily. 1 kit 1   buPROPion (WELLBUTRIN XL) 150 MG 24 hr tablet TAKE 1 TABLET BY MOUTH EVERY MORNING 90 tablet 0   carvedilol (COREG) 6.25 MG tablet Take 1 tablet (6.25 mg total) by mouth 2 (two) times daily with a meal. 180 tablet 3   Cholecalciferol (VITAMIN D) 50 MCG (2000 UT) tablet Take 2,000 Units by mouth daily.     Continuous Blood Gluc Receiver (FREESTYLE LIBRE 2 READER) DEVI 1 each by Does not apply route daily. 6 each 3   Continuous Blood Gluc Sensor (FREESTYLE LIBRE 2 SENSOR) MISC APPLY AS DIRECTED EVERY 14 DAYS 6 each 3   ENTRESTO 24-26 MG TAKE 1 TABLET BY MOUTH TWICE DAILY 60 tablet 11   fluticasone (FLONASE) 50 MCG/ACT nasal spray Place 2 sprays into both nostrils daily. 16 g 1   furosemide (LASIX) 40 MG tablet Take 1 tablet (40 mg total) by mouth daily. 90 tablet 3   glucose blood (ONETOUCH VERIO) test strip Test three times daily as directed. DX: E11.9 300 each 3   Insulin Pen Needle 32G X 4 MM MISC 1 each by Other route daily. E11.9; THIS IS THE CORRECT PEN NEEDLE. PLEASE FILL RX AS ORDERED 100 each 3    JARDIANCE 25 MG TABS tablet TAKE 1 TABLET BY MOUTH DAILY 90 tablet 3   LANTUS SOLOSTAR 100 UNIT/ML Solostar Pen INJECT SUBCUTANEOUSLY 18-20  UNITS AT BEDTIME 30 mL 3   meclizine (ANTIVERT) 25 MG tablet Take 25  mg by mouth 3 (three) times daily as needed for dizziness.     metFORMIN (GLUCOPHAGE) 1000 MG tablet Take 1 tablet (1,000 mg total) by mouth 2 (two) times daily with a meal. 180 tablet 3   omeprazole (PRILOSEC) 40 MG capsule Take 1 capsule (40 mg total) by mouth 2 (two) times daily. Please call [phone] to schedule an office visit for more refills 60 capsule 3   OneTouch Delica Lancets 30G MISC 1 each by Does not apply route daily. E11.9 100 each 0   potassium chloride SA (KLOR-CON M20) 20 MEQ tablet Take 1 tablet (20 mEq total) by mouth daily for 3 days. 3 tablet 0   Probiotic Product (PROBIOTIC ADVANCED PO) Take 1 tablet by mouth at bedtime.      rosuvastatin (CRESTOR) 10 MG tablet Take 1 tablet (10 mg total) by mouth daily. 30 tablet 11   sertraline (ZOLOFT) 100 MG tablet TAKE 1 TABLET BY MOUTH DAILY 90 tablet 3   spironolactone (ALDACTONE) 25 MG tablet TAKE 1 TABLET BY MOUTH DAILY 90 tablet 3   TRULICITY 3 MG/0.5ML SOPN INJECT 3 MG (0.5 ML) UNDER THE SKIN ONCE A WEEK 8 mL 0   vitamin B-12 (CYANOCOBALAMIN) 100 MCG tablet Take 100 mcg by mouth daily.     Vitamin E 180 MG CAPS Take 180 mg by mouth at bedtime.      No current facility-administered medications on file prior to visit.   Allergies  Allergen Reactions   Invokana [Canagliflozin] Other (See Comments)    UTI's   Amoxicillin Other (See Comments)    Sore tongue Did it involve swelling of the face/tongue/throat, SOB, or low BP? No Did it involve sudden or severe rash/hives, skin peeling, or any reaction on the inside of your mouth or nose? No Did you need to seek medical attention at a hospital or doctor's office? No When did it last happen?      5-10 years ago If all above answers are "NO", may proceed with cephalosporin  use.    Family History  Problem Relation Age of Onset   Liver disease Mother    Dementia Mother    Diabetes Mother        borderline   Depression Mother    Coronary artery disease Father    Heart attack Father    Hypertension Father    Heart disease Father    Early death Father    Cancer Other        leukemia   Stroke Maternal Grandfather    Alcohol abuse Maternal Grandfather    Hyperlipidemia Brother        Amyloidosis   Cancer Brother    Hypertension Brother    Diabetes Maternal Grandmother    Cancer Paternal Uncle        unknown   Heart attack Paternal Grandmother    Heart attack Paternal Uncle    Hypertension Brother    Diabetes Daughter        borderline   Diabetes Sister    Heart disease Sister    Kidney disease Sister    Heart disease Brother    Colon cancer Neg Hx    Esophageal cancer Neg Hx    Rectal cancer Neg Hx    Stomach cancer Neg Hx    PE: BP 124/82   Pulse 80   Ht 5\' 2"  (1.575 m)   Wt 131 lb (59.4 kg)   SpO2 99%   BMI 23.96 kg/m   Wt Readings  from Last 3 Encounters:  02/12/23 131 lb (59.4 kg)  10/16/22 135 lb 9.6 oz (61.5 kg)  09/26/22 132 lb 12.8 oz (60.2 kg)   Constitutional: Normal weight, in NAD Eyes: EOMI, no exophthalmos ENT:  + full thyroid on palpation (L>R lobe), no cervical lymphadenopathy Cardiovascular: RRR, No MRG Respiratory: CTA B Musculoskeletal: no deformities Skin: no rashes Neurological: + tremor with outstretched hands  ASSESSMENT: 1. DM2, insulin-dependent, uncontrolled, without long term complications, but with hyperglycemia  2. HL  3.  Multiple thyroid nodules  PLAN:  1. Patient with longstanding, previously uncontrolled type 2 diabetes, better control lately, with an HbA1c of 6.4%, decreased, at last visit.  She continues on medication regimen with metformin, SGLT2 inhibitor, weekly GLP-1 receptor agonist and long-acting insulin, dose decreased at last visit after sugars started to be low during the night,  in the upper 60s.  She had occasional hyperglycemic values especially after dinner and less frequently after the rest of the meals and we discussed about experimenting with different dinners to see if she could avoid blood sugars in the 200s, but otherwise did not change the regimen. CGM interpretation: -At today's visit, we reviewed her CGM downloads: It appears that 82% of values are in target range (goal >70%), while 16% are higher than 180 (goal <25%), and 2% are lower than 70 (goal <4%).  The calculated average blood sugar is 132.  The projected HbA1c for the next 3 months (GMI) is 6.5%. -Reviewing the CGM trends, sugars are fluctuating within the target range but increasing as the day goes by with higher sugars after dinner, and a decrease in blood sugars overnight with occasional sugars in the 60s in the second half of the night.  At today's visit, we discussed about trying to better cover her dinner and I recommended Prandin before larger dinners.  Will also reduce the Lantus further to avoid hypoglycemia overnight.  We can continue the rest of the regimen. - I suggested to:  Patient Instructions  Please continue: - Metformin 2000 mg with dinner - Jardiance 25 mg daily before breakfast - Trulicity 3 mg weekly  Decrease: - Lantus 12-14 units at night   Add: - Repaglinide (Pranndin) 1 mg before a larger dinner  Please return in 4 months.  - we checked her HbA1c: 6.4% - stable  - advised to check sugars at different times of the day - 4x a day, rotating check times - advised for yearly eye exams >> she is UTD - return to clinic in 4 months  2. HL -Reviewed latest lipid panel from 06/2021: All fractions at goal: Lab Results  Component Value Date   CHOL 109 07/24/2021   HDL 46 07/24/2021   LDLCALC 47 07/24/2021   LDLDIRECT 137.9 02/04/2013   TRIG 78 07/24/2021   CHOLHDL 2.4 07/24/2021  -She is on Crestor 10 mg daily without side effects -She is due for another lipid panel but  plans to schedule an annual physical exam with PCP.  3.  Thyroid nodules -Her left thyroid lobe was enlarged on exam so we checked a thyroid ultrasound in 01/2020.  She had several nodules, of which the left inferior nodule had an indication for biopsy.  A biopsy of this nodule was benign.  For the 2 of the other nodules, a follow-up was indicated in 1 year.  She had another thyroid ultrasound in 01/2021.  Nodules appears to be either stable or decreased in size.  No imaging follow-up was indicated. -She had no  masses felt on palpation of her neck today.  Also, no neck compression symptoms. -Latest TSH was normal: Lab Results  Component Value Date   TSH 1.087 05/03/2022  -We will continue to follow her clinically for now  Carlus Pavlov, MD PhD [facility] Endocrinology

## 2023-02-12 NOTE — Addendum Note (Signed)
Addended by: Judd Gaudier on: 02/12/2023 10:43 AM   Modules accepted: Orders

## 2023-02-12 NOTE — Patient Instructions (Addendum)
Please continue: - Metformin 2000 mg with dinner - Jardiance 25 mg daily before breakfast - Trulicity 3 mg weekly  Decrease: - Lantus 12-14 units at night   Add: - Repaglinide (Pranndin) 1 mg before a larger dinner  Please return in 4 months.

## 2023-03-06 ENCOUNTER — Other Ambulatory Visit: Payer: Self-pay | Admitting: Family

## 2023-03-13 ENCOUNTER — Encounter (HOSPITAL_COMMUNITY): Payer: Self-pay | Admitting: Cardiology

## 2023-03-15 ENCOUNTER — Other Ambulatory Visit: Payer: Self-pay | Admitting: Physician Assistant

## 2023-03-21 ENCOUNTER — Ambulatory Visit: Payer: PPO | Admitting: Family

## 2023-03-21 ENCOUNTER — Ambulatory Visit (HOSPITAL_BASED_OUTPATIENT_CLINIC_OR_DEPARTMENT_OTHER)
Admission: RE | Admit: 2023-03-21 | Discharge: 2023-03-21 | Disposition: A | Discharge status: Home / Self Care | Payer: PPO | Source: Ambulatory Visit | Attending: Family | Admitting: Family

## 2023-03-21 ENCOUNTER — Encounter: Payer: Self-pay | Admitting: Family

## 2023-03-21 VITALS — BP 110/78 | HR 95 | Resp 18 | Ht 62.0 in | Wt 128.2 lb

## 2023-03-21 DIAGNOSIS — E1165 Type 2 diabetes mellitus with hyperglycemia: Secondary | ICD-10-CM | POA: Diagnosis not present

## 2023-03-21 DIAGNOSIS — Z0000 Encounter for general adult medical examination without abnormal findings: Secondary | ICD-10-CM

## 2023-03-21 DIAGNOSIS — I878 Other specified disorders of veins: Secondary | ICD-10-CM | POA: Diagnosis not present

## 2023-03-21 DIAGNOSIS — M545 Low back pain, unspecified: Secondary | ICD-10-CM | POA: Insufficient documentation

## 2023-03-21 DIAGNOSIS — E785 Hyperlipidemia, unspecified: Secondary | ICD-10-CM

## 2023-03-21 DIAGNOSIS — G8929 Other chronic pain: Secondary | ICD-10-CM | POA: Diagnosis not present

## 2023-03-21 DIAGNOSIS — E042 Nontoxic multinodular goiter: Secondary | ICD-10-CM

## 2023-03-21 DIAGNOSIS — M4316 Spondylolisthesis, lumbar region: Secondary | ICD-10-CM | POA: Diagnosis not present

## 2023-03-21 DIAGNOSIS — R7989 Other specified abnormal findings of blood chemistry: Secondary | ICD-10-CM | POA: Diagnosis not present

## 2023-03-21 DIAGNOSIS — M48061 Spinal stenosis, lumbar region without neurogenic claudication: Secondary | ICD-10-CM | POA: Diagnosis not present

## 2023-03-21 DIAGNOSIS — M5116 Intervertebral disc disorders with radiculopathy, lumbar region: Secondary | ICD-10-CM | POA: Diagnosis not present

## 2023-03-21 DIAGNOSIS — Z794 Long term (current) use of insulin: Secondary | ICD-10-CM

## 2023-03-21 DIAGNOSIS — M25552 Pain in left hip: Secondary | ICD-10-CM | POA: Diagnosis not present

## 2023-03-21 DIAGNOSIS — Z13220 Encounter for screening for lipoid disorders: Secondary | ICD-10-CM

## 2023-03-21 LAB — MICROALBUMIN / CREATININE URINE RATIO
Creatinine,U: 129.2 mg/dL
Microalb Creat Ratio: 1.5 mg/g (ref 0.0–30.0)
Microalb, Ur: 2 mg/dL — ABNORMAL HIGH (ref 0.0–1.9)

## 2023-03-21 LAB — COMPREHENSIVE METABOLIC PANEL
ALT: 8 U/L (ref 0–35)
AST: 10 U/L (ref 0–37)
Albumin: 4.3 g/dL (ref 3.5–5.2)
Alkaline Phosphatase: 59 U/L (ref 39–117)
BUN: 20 mg/dL (ref 6–23)
CO2: 27 mEq/L (ref 19–32)
Calcium: 10.2 mg/dL (ref 8.4–10.5)
Chloride: 105 mEq/L (ref 96–112)
Creatinine, Ser: 0.95 mg/dL (ref 0.40–1.20)
GFR: 59.73 mL/min — ABNORMAL LOW (ref >60.00)
Glucose, Bld: 98 mg/dL (ref 70–99)
Potassium: 4.6 mEq/L (ref 3.5–5.1)
Sodium: 143 mEq/L (ref 135–145)
Total Bilirubin: 0.4 mg/dL (ref 0.2–1.2)
Total Protein: 6.8 g/dL (ref 6.0–8.3)

## 2023-03-21 LAB — CBC WITH DIFFERENTIAL/PLATELET
Basophils Absolute: 0.1 10*3/uL (ref 0.0–0.1)
Basophils Relative: 0.4 % (ref 0.0–3.0)
Eosinophils Absolute: 0.2 10*3/uL (ref 0.0–0.7)
Eosinophils Relative: 1 % (ref 0.0–5.0)
HCT: 38.6 % (ref 36.0–46.0)
Hemoglobin: 12.3 g/dL (ref 12.0–15.0)
Lymphocytes Relative: 26.5 % (ref 12.0–46.0)
Lymphs Abs: 4.2 10*3/uL — ABNORMAL HIGH (ref 0.7–4.0)
MCHC: 32 g/dL (ref 30.0–36.0)
MCV: 91.1 fl (ref 78.0–100.0)
Monocytes Absolute: 1.2 10*3/uL — ABNORMAL HIGH (ref 0.1–1.0)
Monocytes Relative: 7.3 % (ref 3.0–12.0)
Neutro Abs: 10.3 10*3/uL — ABNORMAL HIGH (ref 1.4–7.7)
Neutrophils Relative %: 64.8 % (ref 43.0–77.0)
Platelets: 486 10*3/uL — ABNORMAL HIGH (ref 150.0–400.0)
RBC: 4.23 Mil/uL (ref 3.87–5.11)
RDW: 13.4 % (ref 11.5–15.5)
WBC: 15.9 10*3/uL — ABNORMAL HIGH (ref 4.0–10.5)

## 2023-03-21 LAB — LIPID PANEL
Cholesterol: 95 mg/dL (ref 0–200)
HDL: 45 mg/dL (ref >39.00)
LDL Cholesterol: 28 mg/dL (ref 0–99)
NonHDL: 50.38
Total CHOL/HDL Ratio: 2
Triglycerides: 110 mg/dL (ref 0.0–149.0)
VLDL: 22 mg/dL (ref 0.0–40.0)

## 2023-03-21 LAB — TSH: TSH: 0.8 u[IU]/mL (ref 0.35–5.50)

## 2023-03-21 LAB — VITAMIN B12: Vitamin B-12: 125 pg/mL — ABNORMAL LOW (ref 211–911)

## 2023-03-21 MED ORDER — SERTRALINE HCL 100 MG PO TABS: 100.0000 mg | ORAL_TABLET | Freq: Every day | ORAL | 3 refills | Status: DC

## 2023-03-21 MED ORDER — BUPROPION HCL ER (XL) 150 MG PO TB24: 150.0000 mg | ORAL_TABLET | Freq: Every morning | ORAL | 3 refills | Status: DC

## 2023-03-21 MED ORDER — BUPROPION HCL ER (XL) 300 MG PO TB24: 300.0000 mg | ORAL_TABLET | Freq: Every morning | ORAL | 1 refills | Status: AC

## 2023-03-21 NOTE — Progress Notes (Signed)
Margaret Norman is a 72 y.o. female with the following history as recorded in EpicCare:  Patient Active Problem List   Diagnosis Date Noted   Multiple thyroid nodules 09/20/2020   Port-A-Cath in place 06/10/2019   Breast cancer (HCC) 04/15/2019   Second degree AV block    Malignant neoplasm of upper-outer quadrant of right breast in female, estrogen receptor positive (HCC) 03/25/2019   Thyromegaly 01/10/2018   Overweight (BMI 25.0-29.9) 08/13/2017   Upper respiratory tract infection 07/30/2017   Type 2 diabetes mellitus with hyperglycemia, with long-term current use of insulin (HCC) 03/19/2017   Thiamine deficiency 01/04/2017   Tinnitus aurium, 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   Deficiency anemia 04/30/2016   Routine general medical examination at a health care facility 04/27/2015   Abdominal wall hernia 04/26/2015   Family history of hemochromatosis 09/13/2014   Acute maxillary sinusitis 05/14/2014   Personal history of colonic polyps 03/10/2014   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   Lumbosacral spondylosis without myelopathy 05/12/2013   Insomnia, persistent 02/04/2013   Obesity (BMI 30-39.9) 02/04/2013   Unspecified asthma, with exacerbation 09/22/2012   Visit for screening mammogram 07/04/2012   Nephrolithiasis    HTN (hypertension)    Gallstones    LBBB (left bundle branch block)    Leukocytosis 12/20/2008   Hyperlipidemia with target LDL less than 100 09/04/2007   Chronic depression 09/04/2007   GERD 09/04/2007    Current Outpatient Medications  Medication Sig Dispense Refill   acetaminophen (TYLENOL) 500 MG tablet Take 1,000 mg by mouth every 6 (six) hours as needed for moderate pain.     anastrozole (ARIMIDEX) 1 MG tablet Take 1 tablet (1 mg total) by mouth daily. 90 tablet 3   aspirin 81 MG EC tablet Take 81 mg by mouth daily.     Blood Glucose Monitoring Suppl (ONETOUCH VERIO FLEX SYSTEM) W/DEVICE KIT 1 kit by Does not apply route 3 (three) times daily. 1 kit 1    carvedilol (COREG) 6.25 MG tablet Take 1 tablet (6.25 mg total) by mouth 2 (two) times daily with a meal. 180 tablet 3   Cholecalciferol (VITAMIN D) 50 MCG (2000 UT) tablet Take 2,000 Units by mouth daily.     Continuous Blood Gluc Receiver (FREESTYLE LIBRE 2 READER) DEVI 1 each by Does not apply route daily. 6 each 3   Continuous Blood Gluc Sensor (FREESTYLE LIBRE 2 SENSOR) MISC APPLY AS DIRECTED EVERY 14 DAYS 6 each 3   ENTRESTO 24-26 MG TAKE 1 TABLET BY MOUTH TWICE DAILY 60 tablet 11   fluticasone (FLONASE) 50 MCG/ACT nasal spray Place 2 sprays into both nostrils daily. 16 g 1   glucose blood (ONETOUCH VERIO) test strip Test three times daily as directed. DX: E11.9 300 each 3   Insulin Pen Needle 32G X 4 MM MISC 1 each by Other route daily. E11.9; THIS IS THE CORRECT PEN NEEDLE. PLEASE FILL RX AS ORDERED 100 each 3   JARDIANCE 25 MG TABS tablet TAKE 1 TABLET BY MOUTH DAILY 90 tablet 3   LANTUS SOLOSTAR 100 UNIT/ML Solostar Pen INJECT SUBCUTANEOUSLY 12-14  UNITS AT BEDTIME     meclizine (ANTIVERT) 25 MG tablet Take 25 mg by mouth 3 (three) times daily as needed for dizziness.     metFORMIN (GLUCOPHAGE) 1000 MG tablet Take 1 tablet (1,000 mg total) by mouth 2 (two) times daily with a meal. 180 tablet 3   omeprazole (PRILOSEC) 40 MG capsule Take 1 capsule (40 mg total) by mouth 2 (two) times daily.  Please call [phone] to schedule an office visit for more refills 60 capsule 3   OneTouch Delica Lancets 30G MISC 1 each by Does not apply route daily. E11.9 100 each 0   Probiotic Product (PROBIOTIC ADVANCED PO) Take 1 tablet by mouth at bedtime.      repaglinide (PRANDIN) 1 MG tablet Take 1 tablet (1 mg total) by mouth 3 (three) times daily before meals. 90 tablet 3   rosuvastatin (CRESTOR) 10 MG tablet Take 1 tablet (10 mg total) by mouth daily. 30 tablet 11   spironolactone (ALDACTONE) 25 MG tablet TAKE 1 TABLET BY MOUTH DAILY 90 tablet 3   TRULICITY 3 MG/0.5ML SOPN INJECT 3 MG (0.5 ML) UNDER THE  SKIN ONCE A WEEK 8 mL 0   vitamin B-12 (CYANOCOBALAMIN) 100 MCG tablet Take 100 mcg by mouth daily.     Vitamin E 180 MG CAPS Take 180 mg by mouth at bedtime.      buPROPion (WELLBUTRIN XL) 150 MG 24 hr tablet Take 1 tablet (150 mg total) by mouth every morning. 90 tablet 3   furosemide (LASIX) 40 MG tablet Take 1 tablet (40 mg total) by mouth daily. 90 tablet 3   potassium chloride SA (KLOR-CON M20) 20 MEQ tablet Take 1 tablet (20 mEq total) by mouth daily for 3 days. 3 tablet 0   sertraline (ZOLOFT) 100 MG tablet Take 1 tablet (100 mg total) by mouth daily. 90 tablet 3   No current facility-administered medications for this visit.    Allergies: Invokana [canagliflozin] and Amoxicillin  Past Medical History:  Diagnosis Date   Allergy AMOXICILLAN   Arthritis    Chest pain    Nuclear, adenosine,  December, 2013, low risk nuclear scan with small, moderate in intensity, fixed anteroseptal defect. This is possibly related to an LBBB versus small prior infarct. 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 w/appy   CHOLECYSTECTOMY      ESOPHAGOGASTRODUODENOSCOPY     HERNIA REPAIR     KNEE ARTHROSCOPY Right 11/2005   LIVER BIOPSY     PANCREATIC CYST EXCISION     PORT-A-CATH REMOVAL Left 04/25/2020   Procedure: REMOVAL PORT-A-CATH;  Surgeon: Abigail Miyamoto, MD;  Location: [facility];  Service: General;  Laterality: Left;   PORTACATH PLACEMENT Left 04/15/2019   Procedure: INSERTION PORT-A-CATH WITH ULTRASOUND;  Surgeon: Abigail Miyamoto, MD;  Location: [facility];  Service: General;  Laterality: Left;   RIGHT/LEFT HEART CATH AND CORONARY ANGIOGRAPHY N/A 09/28/2019   Procedure: RIGHT/LEFT HEART CATH AND CORONARY  ANGIOGRAPHY;  Surgeon: Laurey Morale, MD;  Location: [facility] CV LAB;  Service: Cardiovascular;  Laterality: N/A;   SPLENECTOMY     TONSILLECTOMY AND ADENOIDECTOMY     TUBAL LIGATION  06/24/78    Family History  Problem Relation Age of Onset   Liver disease Mother    Dementia Mother    Diabetes Mother        borderline   Depression Mother    Coronary artery disease Father    Heart attack Father    Hypertension Father    Heart disease Father    Early death Father    Cancer Other        leukemia   Stroke Maternal Grandfather    Alcohol abuse Maternal Grandfather    Hyperlipidemia Brother        Amyloidosis   Cancer Brother    Hypertension Brother    Diabetes Maternal Grandmother    Cancer Paternal Uncle        unknown   Heart attack Paternal Grandmother    Heart attack Paternal Uncle    Hypertension Brother    Diabetes Daughter        borderline   Diabetes Sister    Heart disease Sister    Kidney disease Sister    Heart disease Brother    Colon cancer Neg Hx    Esophageal cancer Neg Hx    Rectal cancer Neg Hx    Stomach cancer Neg Hx     Social History   Tobacco Use   Smoking status: Never   Smokeless tobacco: Never  Substance Use Topics   Alcohol use: No    Subjective:  Presents for yearly follow up; would like to do CPE/ labs today; continuing to work with  cardiology and endocrinology;  Notes that back/ hip have been "bothering me" for the past year;  Continuing to struggle with family dynamics- admits to feeling depressed/ does not want to meet with therapist at this time;   Review of Systems  Constitutional: Negative.   HENT: Negative.    Eyes: Negative.   Respiratory: Negative.    Cardiovascular: Negative.   Gastrointestinal: Negative.   Genitourinary: Negative.   Musculoskeletal: Negative.   Skin: Negative.   Neurological: Negative.   Endo/Heme/Allergies: Negative.   Psychiatric/Behavioral: Negative.        Objective:  Vitals:   03/21/23 1023  BP: 110/78  Pulse: 95  Resp: 18  SpO2: 97%  Weight: 128 lb 3.2 oz (58.2 kg)  Height: 5\' 2"  (1.575 m)    General: Well developed, well nourished, in no acute distress  Skin : Warm and dry.  Head: Normocephalic and atraumatic  Eyes: Sclera and conjunctiva clear; pupils round and reactive to light; extraocular movements intact  Ears: External normal; canals clear; tympanic membranes normal  Oropharynx: Pink, supple. No suspicious lesions  Neck: Supple without thyromegaly, adenopathy  Lungs: Respirations unlabored; clear to auscultation bilaterally without wheeze, rales, rhonchi  CVS exam: normal rate and regular rhythm.  Abdomen: Soft; nontender; nondistended; normoactive bowel sounds; no masses or hepatosplenomegaly  Musculoskeletal: No deformities; no active joint inflammation  Extremities: No edema, cyanosis, clubbing  Vessels: Symmetric bilaterally  Neurologic: Alert and oriented; speech intact; face symmetrical; moves all extremities well; CNII-XII intact without focal deficit   Assessment:  1. PE (physical exam), annual   2. Lipid screening   3. Hyperlipidemia with target LDL less than 100   4. Multiple thyroid nodules   5. Type 2 diabetes mellitus  with hyperglycemia, with long-term current use of insulin (HCC)   6. Chronic low back pain without sciatica, unspecified  back pain laterality     Plan:  Age appropriate preventive healthcare needs addressed; encouraged regular eye doctor and dental exams; will try increasing her Wellbutrin XL to 300 mg daily and encouraged patient to look into activities at a local church to help limit her social isolation- she agrees to look into activities at [employer] near her home;  Update lumbar and bilateral hip x-rays- to consider referral to orthopedist; follow up to be determined.    No follow-ups on file.  Orders Placed This Encounter  Procedures   DG Lumbar Spine Complete    Standing Status:   Future    Standing Expiration Date:   03/20/2024    Order Specific Question:   Reason for Exam (SYMPTOM  OR DIAGNOSIS REQUIRED)    Answer:   low back pain    Order Specific Question:   Preferred imaging location?    Answer[facility]   DG HIP UNILAT W OR W/O PELVIS 2-3 VIEWS LEFT    Standing Status:   Future    Standing Expiration Date:   09/18/2023    Order Specific Question:   Reason for Exam (SYMPTOM  OR DIAGNOSIS REQUIRED)    Answer:   left hip pain    Order Specific Question:   Preferred imaging location?    Answer:   [facility]   DG HIP UNILAT W OR W/O PELVIS 2-3 VIEWS RIGHT    Standing Status:   Future    Standing Expiration Date:   09/18/2023    Order Specific Question:   Reason for Exam (SYMPTOM  OR DIAGNOSIS REQUIRED)    Answer:   left hip pain    Order Specific Question:   Preferred imaging location?    Answer:   [facility]   CBC with Differential/Platelet   Comp Met (CMET)   Lipid panel   TSH   Urine Microalbumin w/creat. ratio    Requested Prescriptions   Signed Prescriptions Disp Refills   buPROPion (WELLBUTRIN XL) 150 MG 24 hr tablet 90 tablet 3    Sig: Take 1 tablet (150 mg total) by mouth every morning.   sertraline (ZOLOFT) 100 MG tablet 90 tablet 3    Sig: Take 1 tablet (100 mg total) by mouth daily.

## 2023-03-22 ENCOUNTER — Other Ambulatory Visit: Payer: Self-pay | Admitting: Family

## 2023-03-22 DIAGNOSIS — R899 Unspecified abnormal finding in specimens from other organs, systems and tissues: Secondary | ICD-10-CM

## 2023-03-27 ENCOUNTER — Ambulatory Visit (INDEPENDENT_AMBULATORY_CARE_PROVIDER_SITE_OTHER): Payer: PPO

## 2023-03-27 DIAGNOSIS — R7989 Other specified abnormal findings of blood chemistry: Secondary | ICD-10-CM | POA: Diagnosis not present

## 2023-03-27 MED ORDER — CYANOCOBALAMIN 1000 MCG/ML IJ SOLN
1000.0000 ug | Freq: Once | INTRAMUSCULAR | Status: AC
Start: 2023-03-27 — End: 2023-03-27
  Administered 2023-03-27: 1000 ug via INTRAMUSCULAR

## 2023-03-27 NOTE — Progress Notes (Signed)
Pt here for monthly B12 injection per Vernona Rieger  B12 given IM, and pt tolerated injection well.  Next B12 injection scheduled for 03/01/2023

## 2023-04-02 ENCOUNTER — Telehealth: Payer: Self-pay | Admitting: Hematology and Oncology

## 2023-04-02 NOTE — Telephone Encounter (Signed)
04/02/23; Due to a change in the provider schedule, I called patient and rescheduled appointment. The patient is aware of the new date and time.

## 2023-04-03 ENCOUNTER — Ambulatory Visit (INDEPENDENT_AMBULATORY_CARE_PROVIDER_SITE_OTHER): Payer: PPO

## 2023-04-03 DIAGNOSIS — E538 Deficiency of other specified B group vitamins: Secondary | ICD-10-CM | POA: Diagnosis not present

## 2023-04-03 MED ORDER — CYANOCOBALAMIN 1000 MCG/ML IJ SOLN
1000.0000 ug | Freq: Once | INTRAMUSCULAR | Status: AC
Start: 2023-04-03 — End: 2023-04-03
  Administered 2023-04-03: 1000 ug via INTRAMUSCULAR

## 2023-04-03 NOTE — Progress Notes (Signed)
Margaret Norman is a 72 y.o. female presents to the office today for #2  B-12 injections, per physician's orders. Original order: eekly B12 shots x 4 weeks and then repeat labs in 6 weeks;  B-12 1000 mg (med),  (dose),  l deltoid  (route) was administered  (location) today. Patient tolerated injection. Patient due for follow up labs/provider appt:  Date due: , appt made  Patient next injection due:   Margaret Norman

## 2023-04-04 ENCOUNTER — Encounter: Payer: Self-pay | Admitting: Family

## 2023-04-10 ENCOUNTER — Other Ambulatory Visit: Payer: Self-pay | Admitting: Family

## 2023-04-10 ENCOUNTER — Ambulatory Visit: Payer: PPO

## 2023-04-10 ENCOUNTER — Ambulatory Visit: Payer: HMO | Admitting: Hematology and Oncology

## 2023-04-10 DIAGNOSIS — E538 Deficiency of other specified B group vitamins: Secondary | ICD-10-CM

## 2023-04-10 DIAGNOSIS — M1612 Unilateral primary osteoarthritis, left hip: Secondary | ICD-10-CM

## 2023-04-10 DIAGNOSIS — M545 Low back pain, unspecified: Secondary | ICD-10-CM

## 2023-04-10 MED ORDER — CYANOCOBALAMIN 1000 MCG/ML IJ SOLN
1000.0000 ug | Freq: Once | INTRAMUSCULAR | Status: AC
Start: 2023-04-10 — End: 2023-04-10
  Administered 2023-04-10: 1000 ug via INTRAMUSCULAR

## 2023-04-10 NOTE — Progress Notes (Signed)
Pt here for monthly B12 injection per Vernona Rieger  B12 given IM, and pt tolerated injection well.  Next B12 injection scheduled for 04/17/23

## 2023-04-17 ENCOUNTER — Ambulatory Visit: Payer: PPO

## 2023-04-17 DIAGNOSIS — E538 Deficiency of other specified B group vitamins: Secondary | ICD-10-CM

## 2023-04-17 MED ORDER — CYANOCOBALAMIN 1000 MCG/ML IJ SOLN
1000.0000 ug | Freq: Once | INTRAMUSCULAR | Status: AC
Start: 2023-04-17 — End: 2023-04-17
  Administered 2023-04-17: 1000 ug via INTRAMUSCULAR

## 2023-04-17 NOTE — Progress Notes (Signed)
Margaret Norman is a 72 y.o. female presents to the office today for 4# B-12 injections, per physician's orders. Original order: weekly B12 shots x 4 weeks and then repeat labs in 6 weeks; B-12 1000 mg (med), (dose), l deltoid (route) was administered (location) today. Patient tolerated injection. Patient due for follow up labs/provider appt: Date due: , appt made

## 2023-04-18 ENCOUNTER — Ambulatory Visit: Payer: PPO | Admitting: Orthopaedic Surgery

## 2023-04-18 ENCOUNTER — Other Ambulatory Visit (INDEPENDENT_AMBULATORY_CARE_PROVIDER_SITE_OTHER): Payer: PPO

## 2023-04-18 DIAGNOSIS — M1612 Unilateral primary osteoarthritis, left hip: Secondary | ICD-10-CM | POA: Diagnosis not present

## 2023-04-18 NOTE — Progress Notes (Signed)
Office Visit Note   Patient: Margaret Norman           Date of Birth: 14-Oct-1950           MRN: [id number] Visit Date: 04/18/2023              Requested by: Olive Bass, FNP [address] [place],  [place] [address] PCP: Olive Bass, FNP   Assessment & Plan: Visit Diagnoses:  1. Primary osteoarthritis of left hip     Plan: Ms. Bickel is a 72 year old female with end-stage left hip DJD with bone-on-bone joint space narrowing.  She has had 3 cortisone injections at [facility] orth[facility]ics that have each provided temporary relief.  She is walking with a cane.  Her pain affects her severely with ADLs.  Based on treatment options she would like to proceed with left total hip replacement.  Risk benefits prognosis reviewed.  Eunice Blase will call the patient to confirm surgery date once we have the necessary clearances.  Impression is severe left hip degenerative joint disease secondary to Osteoarthritis.  Imaging shows bone on bone joint space narrowing.  At this point, conservative treatments fail to provide any significant relief and the pain is severely affecting ADLs and quality of life.  Based on treatment options, the patient has elected to move forward with a hip replacement.  We have discussed the surgical risks that include but are not limited to infection, DVT, leg length discrepancy, numbness, tingling, incomplete relief of pain.  Recovery and prognosis were also reviewed.    Current anticoagulants: aspirin 81 mg daily Postop anticoagulation: Xarelto h/o breast cancer Diabetic: Yes  well controlled Prior DVT/PE: No Tobacco use: No Clearances needed for surgery: Marca Ancona; Ria Clock Anticipate discharge dispo: home   Follow-Up Instructions: No follow-ups on file.   Orders:  Orders Placed This Encounter  Procedures   XR HIP UNILAT W OR W/O PELVIS 2-3 VIEWS LEFT   No orders of the defined types were placed in this encounter.      Procedures: No procedures performed   Clinical Data: No additional findings.   Subjective: Chief Complaint  Patient presents with   Left Hip - Pain    HPI Ms. Ebarb is a very pleasant 72 year old female who comes in today for evaluation of severe left hip pain affecting ADLs.  She has trouble lifting her leg to put on shoes and socks and into the car.  She has had several falls.  Currently using a cane for ambulation.  Uses Tylenol and Aleve that provides temporary relief.  She has had 3 prior cortisone injections in her left hip by Dr. Althea Charon at [facility] orthopedics.  She has a history of breast cancer.  Review of Systems  Constitutional: Negative.   HENT: Negative.    Eyes: Negative.   Respiratory: Negative.    Cardiovascular: Negative.   Endocrine: Negative.   Musculoskeletal: Negative.   Neurological: Negative.   Hematological: Negative.   Psychiatric/Behavioral: Negative.    All other systems reviewed and are negative.    Objective: Vital Signs: There were no vitals taken for this visit.  Physical Exam Vitals and nursing note reviewed.  Constitutional:      Appearance: She is well-developed.  HENT:     Head: Atraumatic.     Nose: Nose normal.  Eyes:     Extraocular Movements: Extraocular movements intact.  Cardiovascular:     Pulses: Normal pulses.  Pulmonary:     Effort:  Pulmonary effort is normal.  Abdominal:     Palpations: Abdomen is soft.  Musculoskeletal:     Cervical back: Neck supple.  Skin:    General: Skin is warm.     Capillary Refill: Capillary refill takes less than 2 seconds.  Neurological:     Mental Status: She is alert. Mental status is at baseline.  Psychiatric:        Behavior: Behavior normal.        Thought Content: Thought content normal.        Judgment: Judgment normal.     Ortho Exam Exam of the left hip shows significant pain with hip flexion and internal rotation.  Positive Stinchfield sign.  No trochanteric  tenderness.  No radicular signs. Specialty Comments:  No specialty comments available.  Imaging: No results found.   PMFS History: Patient Active Problem List   Diagnosis Date Noted   Multiple thyroid nodules 09/20/2020   Port-A-Cath in place 06/10/2019   Breast cancer (HCC) 04/15/2019   Second degree AV block    Malignant neoplasm of upper-outer quadrant of right breast in female, estrogen receptor positive (HCC) 03/25/2019   Thyromegaly 01/10/2018   Overweight (BMI 25.0-29.9) 08/13/2017   Upper respiratory tract infection 07/30/2017   Type 2 diabetes mellitus with hyperglycemia, with long-term current use of insulin (HCC) 03/19/2017   Thiamine deficiency 01/04/2017   Tinnitus aurium, 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   Deficiency anemia 04/30/2016   Routine general medical examination at a health care facility 04/27/2015   Abdominal wall hernia 04/26/2015   Family history of hemochromatosis 09/13/2014   Acute maxillary sinusitis 05/14/2014   History of colonic polyps 03/10/2014   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   Lumbosacral spondylosis without myelopathy 05/12/2013   Insomnia, persistent 02/04/2013   Obesity (BMI 30-39.9) 02/04/2013   Asthma with exacerbation 09/22/2012   Visit for screening mammogram 07/04/2012   Nephrolithiasis    HTN (hypertension)    Gallstones    LBBB (left bundle branch block)    Leukocytosis 12/20/2008   Hyperlipidemia with target LDL less than 100 09/04/2007   Chronic depression 09/04/2007   GERD 09/04/2007   Past Medical History:  Diagnosis Date   Allergy AMOXICILLAN   Arthritis    Chest pain    Nuclear, adenosine,  December, 2013, low risk nuclear scan with small, moderate in intensity, fixed anteroseptal defect. This is possibly related to an LBBB versus small prior infarct. 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   Type II or unspecified type diabetes mellitus without mention of complication, not stated as uncontrolled     Family History  Problem Relation Age of Onset   Liver disease Mother    Dementia Mother    Diabetes Mother        borderline   Depression Mother    Coronary artery disease Father    Heart attack Father    Hypertension Father    Heart disease Father    Early death Father    Cancer Other        leukemia   Stroke Maternal Grandfather    Alcohol abuse Maternal Grandfather    Hyperlipidemia Brother  Amyloidosis   Cancer Brother    Hypertension Brother    Diabetes Maternal Grandmother    Cancer Paternal Uncle        unknown   Heart attack Paternal Grandmother    Heart attack Paternal Uncle    Hypertension Brother    Diabetes Daughter        borderline   Diabetes Sister    Heart disease Sister    Kidney disease Sister    Heart disease Brother    Colon cancer Neg Hx    Esophageal cancer Neg Hx    Rectal cancer Neg Hx    Stomach cancer Neg Hx     Past Surgical History:  Procedure Laterality Date   APPENDECTOMY     BREAST BIOPSY Left 10/2017   BREAST BIOPSY Right 03/2019   BREAST LUMPECTOMY Right 2020   BREAST LUMPECTOMY WITH RADIOACTIVE SEED AND SENTINEL LYMPH NODE BIOPSY Right 04/15/2019   Procedure: RIGHT BREAST PARTIAL MASTECTOMY WITH RADIOACTIVE SEED AND SENTINEL LYMPH NODE BIOPSY;  Surgeon: Abigail Miyamoto, MD;  Location: [facility];  Service: General;  Laterality: Right;   CESAREAN SECTION     x2 ? w/appy   CHOLECYSTECTOMY     ESOPHAGOGASTRODUODENOSCOPY     HERNIA REPAIR     KNEE ARTHROSCOPY Right 11/2005   LIVER BIOPSY     PANCREATIC CYST EXCISION     PORT-A-CATH REMOVAL Left 04/25/2020   Procedure: REMOVAL PORT-A-CATH;  Surgeon: Abigail Miyamoto, MD;  Location: [facility];  Service: General;  Laterality: Left;   PORTACATH PLACEMENT Left 04/15/2019   Procedure: INSERTION PORT-A-CATH WITH ULTRASOUND;  Surgeon: Abigail Miyamoto, MD;  Location: [facility];  Service: General;  Laterality: Left;   RIGHT/LEFT HEART CATH AND CORONARY ANGIOGRAPHY N/A 09/28/2019   Procedure: RIGHT/LEFT HEART CATH AND CORONARY ANGIOGRAPHY;  Surgeon: Laurey Morale, MD;  Location: [facility] INVASIVE [facility] LAB;  Service: Cardiovascular;  Laterality: N/A;   SPLENECTOMY     TONSILLECTOMY AND ADENOIDECTOMY     TUBAL LIGATION  06/24/78   Social History   Occupational History   Occupation: CSR    Employer: [employer]   Occupation: retired  Tobacco Use   Smoking status: Never   Smokeless tobacco: Never  Vaping Use   Vaping status: Never Used  Substance and Sexual Activity   Alcohol use: No   Drug use: No   Sexual activity: Not Currently    Birth control/protection: Pill    Comment: Tubes tied

## 2023-04-26 ENCOUNTER — Emergency Department (HOSPITAL_COMMUNITY): Payer: PPO

## 2023-04-26 ENCOUNTER — Emergency Department (HOSPITAL_COMMUNITY)
Admission: EM | Admit: 2023-04-26 | Discharge: 2023-04-27 | Disposition: A | Discharge status: Home / Self Care | Payer: PPO | Attending: Emergency Medicine | Admitting: Emergency Medicine

## 2023-04-26 ENCOUNTER — Ambulatory Visit (INDEPENDENT_AMBULATORY_CARE_PROVIDER_SITE_OTHER): Payer: PPO | Admitting: Family

## 2023-04-26 ENCOUNTER — Encounter (HOSPITAL_COMMUNITY): Payer: Self-pay | Admitting: Emergency Medicine

## 2023-04-26 ENCOUNTER — Encounter: Payer: Self-pay | Admitting: Family

## 2023-04-26 ENCOUNTER — Other Ambulatory Visit: Payer: Self-pay

## 2023-04-26 VITALS — BP 126/88 | HR 90 | Resp 18 | Ht 62.0 in | Wt 128.2 lb

## 2023-04-26 DIAGNOSIS — S0990XA Unspecified injury of head, initial encounter: Secondary | ICD-10-CM | POA: Diagnosis not present

## 2023-04-26 DIAGNOSIS — Z7984 Long term (current) use of oral hypoglycemic drugs: Secondary | ICD-10-CM | POA: Insufficient documentation

## 2023-04-26 DIAGNOSIS — R55 Syncope and collapse: Secondary | ICD-10-CM | POA: Diagnosis not present

## 2023-04-26 DIAGNOSIS — R0609 Other forms of dyspnea: Secondary | ICD-10-CM | POA: Diagnosis not present

## 2023-04-26 DIAGNOSIS — Z794 Long term (current) use of insulin: Secondary | ICD-10-CM | POA: Insufficient documentation

## 2023-04-26 DIAGNOSIS — R0789 Other chest pain: Secondary | ICD-10-CM | POA: Diagnosis not present

## 2023-04-26 DIAGNOSIS — E119 Type 2 diabetes mellitus without complications: Secondary | ICD-10-CM | POA: Insufficient documentation

## 2023-04-26 DIAGNOSIS — I509 Heart failure, unspecified: Secondary | ICD-10-CM | POA: Insufficient documentation

## 2023-04-26 DIAGNOSIS — D72829 Elevated white blood cell count, unspecified: Secondary | ICD-10-CM | POA: Insufficient documentation

## 2023-04-26 DIAGNOSIS — Z7982 Long term (current) use of aspirin: Secondary | ICD-10-CM | POA: Diagnosis not present

## 2023-04-26 DIAGNOSIS — R531 Weakness: Secondary | ICD-10-CM | POA: Diagnosis not present

## 2023-04-26 LAB — CBC
HCT: 40 % (ref 36.0–46.0)
Hemoglobin: 12.9 g/dL (ref 12.0–15.0)
MCH: 30.3 pg (ref 26.0–34.0)
MCHC: 32.3 g/dL (ref 30.0–36.0)
MCV: 93.9 fL (ref 80.0–100.0)
Platelets: 493 10*3/uL — ABNORMAL HIGH (ref 150–400)
RBC: 4.26 MIL/uL (ref 3.87–5.11)
RDW: 13.7 % (ref 11.5–15.5)
WBC: 17.8 10*3/uL — ABNORMAL HIGH (ref 4.0–10.5)
nRBC: 0 % (ref 0.0–0.2)

## 2023-04-26 LAB — BASIC METABOLIC PANEL
Anion gap: 11 (ref 5–15)
BUN: 18 mg/dL (ref 8–23)
CO2: 26 mmol/L (ref 22–32)
Calcium: 9.9 mg/dL (ref 8.9–10.3)
Chloride: 102 mmol/L (ref 98–111)
Creatinine, Ser: 1.07 mg/dL — ABNORMAL HIGH (ref 0.44–1.00)
GFR, Estimated: 55 mL/min — ABNORMAL LOW (ref >60)
Glucose, Bld: 207 mg/dL — ABNORMAL HIGH (ref 70–99)
Potassium: 3.9 mmol/L (ref 3.5–5.1)
Sodium: 139 mmol/L (ref 135–145)

## 2023-04-26 LAB — TROPONIN I (HIGH SENSITIVITY)
Troponin I (High Sensitivity): 3 ng/L (ref <18)
Troponin I (High Sensitivity): 4 ng/L (ref <18)

## 2023-04-26 NOTE — ED Triage Notes (Signed)
Patient arrives ambulatory with cane by POV with daughter. Patient states she is here due to having shakiness and doctor is concerned of stroke. Patient daughter states patient fell Tuesday night and since then patient has began having tremors to hands and feet. Patient denies hitting her head when falling.

## 2023-04-26 NOTE — Progress Notes (Signed)
Margaret Norman is a 72 y.o. female with the following history as recorded in Epic[employer]:  Patient Active Problem List   Diagnosis Date Noted   Multiple thyroid nodules 09/20/2020   Port-A-Cath in place 06/10/2019   Breast cancer (HCC) 04/15/2019   Second degree AV block    Malignant neoplasm of upper-outer quadrant of right breast in female, estrogen receptor positive (HCC) 03/25/2019   Thyromegaly 01/10/2018   Overweight (BMI 25.0-29.9) 08/13/2017   Upper respiratory tract infection 07/30/2017   Type 2 diabetes mellitus with hyperglycemia, with long-term current use of insulin (HCC) 03/19/2017   Thiamine deficiency 01/04/2017   Tinnitus aurium, 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   Deficiency anemia 04/30/2016   Routine general medical examination at a health care facility 04/27/2015   Abdominal wall hernia 04/26/2015   Family history of hemochromatosis 09/13/2014   Acute maxillary sinusitis 05/14/2014   History of colonic polyps 03/10/2014   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   Lumbosacral spondylosis without myelopathy 05/12/2013   Insomnia, persistent 02/04/2013   Obesity (BMI 30-39.9) 02/04/2013   Asthma with exacerbation 09/22/2012   Visit for screening mammogram 07/04/2012   Nephrolithiasis    HTN (hypertension)    Gallstones    LBBB (left bundle branch block)    Leukocytosis 12/20/2008   Hyperlipidemia with target LDL less than 100 09/04/2007   Chronic depression 09/04/2007   GERD 09/04/2007    Current Outpatient Medications  Medication Sig Dispense Refill   acetaminophen (TYLENOL) 500 MG tablet Take 1,000 mg by mouth every 6 (six) hours as needed for moderate pain.     anastrozole (ARIMIDEX) 1 MG tablet Take 1 tablet (1 mg total) by mouth daily. 90 tablet 3   aspirin 81 MG EC tablet Take 81 mg by mouth daily.     Blood Glucose Monitoring Suppl (ONETOUCH VERIO FLEX SYSTEM) W/DEVICE KIT 1 kit by Does not apply route 3 (three) times daily. 1 kit 1   buPROPion (WELLBUTRIN  XL) 300 MG 24 hr tablet Take 1 tablet (300 mg total) by mouth every morning. 90 tablet 1   carvedilol (COREG) 6.25 MG tablet Take 1 tablet (6.25 mg total) by mouth 2 (two) times daily with a meal. 180 tablet 3   Cholecalciferol (VITAMIN D) 50 MCG (2000 UT) tablet Take 2,000 Units by mouth daily.     Continuous Blood Gluc Receiver (FREESTYLE LIBRE 2 READER) DEVI 1 each by Does not apply route daily. 6 each 3   Continuous Blood Gluc Sensor (FREESTYLE LIBRE 2 SENSOR) MISC APPLY AS DIRECTED EVERY 14 DAYS 6 each 3   ENTRESTO 24-26 MG TAKE 1 TABLET BY MOUTH TWICE DAILY 60 tablet 11   fluticasone (FLONASE) 50 MCG/ACT nasal spray Place 2 sprays into both nostrils daily. 16 g 1   glucose blood (ONETOUCH VERIO) test strip Test three times daily as directed. DX: E11.9 300 each 3   Insulin Pen Needle 32G X 4 MM MISC 1 each by Other route daily. E11.9; THIS IS THE CORRECT PEN NEEDLE. PLEASE FILL RX AS ORDERED 100 each 3   JARDIANCE 25 MG TABS tablet TAKE 1 TABLET BY MOUTH DAILY 90 tablet 3   LANTUS SOLOSTAR 100 UNIT/ML Solostar Pen INJECT SUBCUTANEOUSLY 12-14  UNITS AT BEDTIME     meclizine (ANTIVERT) 25 MG tablet Take 25 mg by mouth 3 (three) times daily as needed for dizziness.     metFORMIN (GLUCOPHAGE) 1000 MG tablet Take 1 tablet (1,000 mg total) by mouth 2 (two) times daily with a meal. 180  tablet 3   omeprazole (PRILOSEC) 40 MG capsule Take 1 capsule (40 mg total) by mouth 2 (two) times daily. Please call [phone] to schedule an office visit for more refills 60 capsule 3   OneTouch Delica Lancets 30G MISC 1 each by Does not apply route daily. E11.9 100 each 0   Probiotic Product (PROBIOTIC ADVANCED PO) Take 1 tablet by mouth at bedtime.      repaglinide (PRANDIN) 1 MG tablet Take 1 tablet (1 mg total) by mouth 3 (three) times daily before meals. 90 tablet 3   rosuvastatin (CRESTOR) 10 MG tablet Take 1 tablet (10 mg total) by mouth daily. 30 tablet 11   sertraline (ZOLOFT) 100 MG tablet Take 1 tablet  (100 mg total) by mouth daily. 90 tablet 3   spironolactone (ALDACTONE) 25 MG tablet TAKE 1 TABLET BY MOUTH DAILY 90 tablet 3   TRULICITY 3 MG/0.5ML SOPN INJECT 3 MG (0.5 ML) UNDER THE SKIN ONCE A WEEK 8 mL 0   Vitamin E 180 MG CAPS Take 180 mg by mouth at bedtime.      furosemide (LASIX) 40 MG tablet Take 1 tablet (40 mg total) by mouth daily. 90 tablet 3   potassium chloride SA (KLOR-CON M20) 20 MEQ tablet Take 1 tablet (20 mEq total) by mouth daily for 3 days. 3 tablet 0   No current facility-administered medications for this visit.    Allergies: Invokana [canagliflozin] and Amoxicillin  Past Medical History:  Diagnosis Date   Allergy AMOXICILLAN   Arthritis    Chest pain    Nuclear, adenosine,  December, 2013, low risk nuclear scan with small, moderate in intensity, fixed anteroseptal defect. This is possibly related to an LBBB versus small prior infarct. 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 w/appy   CHOLECYSTECTOMY     ESOPHAGOGASTRODUODENOSCOPY     HERNIA REPAIR     KNEE ARTHROSCOPY Right 11/2005   LIVER  BIOPSY     PANCREATIC CYST EXCISION     PORT-A-CATH REMOVAL Left 04/25/2020   Procedure: REMOVAL PORT-A-CATH;  Surgeon: Abigail Miyamoto, MD;  Location: [facility];  Service: General;  Laterality: Left;   PORTACATH PLACEMENT Left 04/15/2019   Procedure: INSERTION PORT-A-CATH WITH ULTRASOUND;  Surgeon: Abigail Miyamoto, MD;  Location: [facility] OR;  Service: General;  Laterality: Left;   RIGHT/LEFT HEART CATH AND CORONARY ANGIOGRAPHY N/A 09/28/2019   Procedure: RIGHT/LEFT HEART CATH AND CORONARY ANGIOGRAPHY;  Surgeon: Laurey Morale, MD;  Location: [facility];  Service: Cardiovascular;  Laterality:  N/A;   SPLENECTOMY     TONSILLECTOMY AND ADENOIDECTOMY     TUBAL LIGATION  06/24/78    Family History  Problem Relation Age of Onset   Liver disease Mother    Dementia Mother    Diabetes Mother        borderline   Depression Mother    Coronary artery disease Father    Heart attack Father    Hypertension Father    Heart disease Father    Early death Father    Cancer Other        leukemia   Stroke Maternal Grandfather    Alcohol abuse Maternal Grandfather    Hyperlipidemia Brother        Amyloidosis   Cancer Brother    Hypertension Brother    Diabetes Maternal Grandmother    Cancer Paternal Uncle        unknown   Heart attack Paternal Grandmother    Heart attack Paternal Uncle    Hypertension Brother    Diabetes Daughter        borderline   Diabetes Sister    Heart disease Sister    Kidney disease Sister    Heart disease Brother    Colon cancer Neg Hx    Esophageal cancer Neg Hx    Rectal cancer Neg Hx    Stomach cancer Neg Hx     Social History   Tobacco Use   Smoking status: Never   Smokeless tobacco: Never  Substance Use Topics   Alcohol use: No    Subjective:   Accompanied by her daughter who is providing the majority of history;  Daughter feels that patient has not been honest about symptoms-  unable to walk without support/ 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;  2 days ago patient fell in her home and had to be helped up by her daughter who lives with her ( not the daughter present at OV)- patient admits she does not know who long she was on the floor and could not get up on her own; per family member, they have been concerned about noticeable "tremors" all over in the past 2 days; feeling very winded with any activity since the fall- more so than usual;   Objective:  Vitals:   04/26/23 1258  BP: 126/88  Pulse: 90  Resp: 18  SpO2: 95%  Weight: 128 lb 3.2 oz (58.2 kg)  Height: 5\' 2"  (1.575 m)    General: Well developed, well nourished, in no acute distress  Skin : Warm and dry.  Head: Normocephalic and atraumatic  Eyes: Sclera and conjunctiva clear; pupils round and reactive to light; extraocular movements intact  Ears: External normal; canals clear; tympanic membranes normal  Oropharynx: Pink, supple. No suspicious lesions  Neck: Supple without thyromegaly, adenopathy  Lungs: Respirations unlabored; clear to auscultation bilaterally without wheeze, rales, rhonchi  CVS exam: normal rate and regular rhythm.  Neurologic: Alert and oriented; speech intact; face symmetrical; uses cane;  Assessment:  1. Syncope, unspecified syncope type   2. DOE (dyspnea on exertion)     Plan:  ? Recent stroke vs cardiac event; patient does admits that symptoms in past 2 days have been much more noticeable than her baseline; discussed concerns for unexplained fall and possible head injury and inability to get imaging done quickly; recommend ER for further evaluation- she will go to [facility] in case MRI is needed; follow up to be determined;   No follow-ups  on file.  No orders of the defined types were placed in this encounter.   Requested Prescriptions    No prescriptions requested or ordered in this encounter

## 2023-04-26 NOTE — ED Provider Notes (Signed)
[place] EMERGENCY DEPARTMENT AT [facility] Provider Note   CSN: [id number] Arrival date & time: 04/26/23  1450     History {Add pertinent medical, surgical, social history, OB history to HPI:1} Chief Complaint  Patient presents with   Fall    CYTHNIA OSMUN is a 72 y.o. female.   Reports fall on Tuesday, tripped over dog toy per pt, daughter states unstable--uses cane, did not hit head, laid on the ground   Saw PCP today,   Reports increased difficulty with moving 2/2 to weakness      Home Medications Prior to Admission medications   Medication Sig Start Date End Date Taking? Authorizing Provider  acetaminophen (TYLENOL) 500 MG tablet Take 1,000 mg by mouth every 6 (six) hours as needed for moderate pain.    [provider]  anastrozole (ARIMIDEX) 1 MG tablet Take 1 tablet (1 mg total) by mouth daily. 04/09/22   Serena Croissant, MD  aspirin 81 MG EC tablet Take 81 mg by mouth daily.    [provider]  Blood Glucose Monitoring Suppl (ONETOUCH VERIO FLEX SYSTEM) W/DEVICE KIT 1 kit by Does not apply route 3 (three) times daily. 01/12/15   Etta Grandchild, MD  buPROPion (WELLBUTRIN XL) 300 MG 24 hr tablet Take 1 tablet (300 mg total) by mouth every morning. 03/21/23   Olive Bass, FNP  carvedilol (COREG) 6.25 MG tablet Take 1 tablet (6.25 mg total) by mouth 2 (two) times daily with a meal. 10/30/22   Laurey Morale, MD  Cholecalciferol (VITAMIN D) 50 MCG (2000 UT) tablet Take 2,000 Units by mouth daily.    [provider]  Continuous Blood Gluc Receiver (FREESTYLE LIBRE 2 READER) DEVI 1 each by Does not apply route daily. 01/17/22   Carlus Pavlov, MD  Continuous Blood Gluc Sensor (FREESTYLE LIBRE 2 SENSOR) MISC APPLY AS DIRECTED EVERY 14 DAYS 05/21/22   Carlus Pavlov, MD  ENTRESTO 24-26 MG TAKE 1 TABLET BY MOUTH TWICE DAILY 08/24/22   Laurey Morale, MD  fluticasone [facility]) 50 MCG/ACT nasal spray Place 2 sprays into both  nostrils daily. 02/20/22   Olive Bass, FNP  furosemide (LASIX) 40 MG tablet Take 1 tablet (40 mg total) by mouth daily. 10/17/22 01/15/23  Jacklynn Ganong, FNP  glucose blood (ONETOUCH VERIO) test strip Test three times daily as directed. DX: E11.9 08/29/20   Carlus Pavlov, MD  Insulin Pen Needle 32G X 4 MM MISC 1 each by Other route daily. E11.9; THIS IS THE CORRECT PEN NEEDLE. PLEASE FILL RX AS ORDERED 01/17/22   Carlus Pavlov, MD  JARDIANCE 25 MG TABS tablet TAKE 1 TABLET BY MOUTH DAILY 11/09/22   Carlus Pavlov, MD  LANTUS SOLOSTAR 100 UNIT/ML Solostar Pen INJECT SUBCUTANEOUSLY 12-14  UNITS AT BEDTIME 02/12/23   Carlus Pavlov, MD  meclizine (ANTIVERT) 25 MG tablet Take 25 mg by mouth 3 (three) times daily as needed for dizziness.    [provider]  metFORMIN (GLUCOPHAGE) 1000 MG tablet Take 1 tablet (1,000 mg total) by mouth 2 (two) times daily with a meal. 09/26/22   Carlus Pavlov, MD  omeprazole (PRILOSEC) 40 MG capsule Take 1 capsule (40 mg total) by mouth 2 (two) times daily. Please call [phone] to schedule an office visit for more refills 11/05/22   Unk Lightning, PA  OneTouch Delica Lancets 30G MISC 1 each by Does not apply route daily. E11.9 01/19/20   Carlus Pavlov, MD  potassium chloride SA (KLOR-CON  M20) 20 MEQ tablet Take 1 tablet (20 mEq total) by mouth daily for 3 days. 10/16/22 10/19/22  Jacklynn Ganong, FNP  Probiotic Product (PROBIOTIC ADVANCED PO) Take 1 tablet by mouth at bedtime.     [provider]  repaglinide (PRANDIN) 1 MG tablet Take 1 tablet (1 mg total) by mouth 3 (three) times daily before meals. 02/12/23   Carlus Pavlov, MD  rosuvastatin (CRESTOR) 10 MG tablet Take 1 tablet (10 mg total) by mouth daily. 07/18/22   Olive Bass, FNP  sertraline (ZOLOFT) 100 MG tablet Take 1 tablet (100 mg total) by mouth daily. 03/21/23   Olive Bass, FNP  spironolactone (ALDACTONE) 25 MG tablet TAKE 1  TABLET BY MOUTH DAILY 12/03/22   Laurey Morale, MD  TRULICITY 3 MG/0.5ML SOPN INJECT 3 MG (0.5 ML) UNDER THE SKIN ONCE A WEEK 05/24/22   Carlus Pavlov, MD  Vitamin E 180 MG CAPS Take 180 mg by mouth at bedtime.     [provider]      Allergies    Invokana [canagliflozin] and Amoxicillin    Review of Systems   Review of Systems  Physical Exam Updated Vital Signs BP 119/77 (BP Location: Right Arm)   Pulse 80   Temp 98 F (36.7 C) (Oral)   Resp 15   Ht 5\' 2"  (1.575 m)   Wt 58.2 kg   SpO2 100%   BMI 23.45 kg/m  Physical Exam  ED Results / Procedures / Treatments   Labs (all labs ordered are listed, but only abnormal results are displayed) Labs Reviewed  BASIC METABOLIC PANEL - Abnormal; Notable for the following components:      Result Value   Glucose, Bld 207 (*)    Creatinine, Ser 1.07 (*)    GFR, Estimated 55 (*)    All other components within normal limits  CBC - Abnormal; Notable for the following components:   WBC 17.8 (*)    Platelets 493 (*)    All other components within normal limits  TROPONIN I (HIGH SENSITIVITY)  TROPONIN I (HIGH SENSITIVITY)    EKG None  Radiology DG Chest 2 View  Result Date: 04/26/2023 CLINICAL DATA:  Intermittent chest pressure EXAM: CHEST - 2 VIEW COMPARISON:  04/15/2019 FINDINGS: No consolidation, pneumothorax or effusion. No edema. Normal cardiopericardial silhouette. Overlapping cardiac leads. Degenerative changes of the spine surgical clips along the right lower thorax and right upper quadrant. IMPRESSION: No acute cardiopulmonary disease. Electronically Signed   By: Karen Kays M.D.   On: 04/26/2023 18:15   CT Head Wo Contrast  Result Date: 04/26/2023 CLINICAL DATA:  Head trauma, minor (Age >= 65y) EXAM: CT HEAD WITHOUT CONTRAST TECHNIQUE: Contiguous axial images were obtained from the base of the skull through the vertex without intravenous contrast. RADIATION DOSE REDUCTION: This exam was performed according  to the departmental dose-optimization program which includes automated exposure control, adjustment of the mA and/or kV according to patient size and/or use of iterative reconstruction technique. COMPARISON:  CT head 06/30/2021. FINDINGS: Brain: No evidence of acute infarction, hemorrhage, hydrocephalus, extra-axial collection or mass lesion/mass effect. Queena Monrreal incidental chronic lipoma along the falx. Vascular: No hyperdense vessel. Skull: No acute fracture. Sinuses/Orbits: Clear sinuses.  No acute orbital findings. Other: No mastoid effusions. IMPRESSION: No evidence of acute intracranial abnormality. Electronically Signed   By: Feliberto Harts M.D.   On: 04/26/2023 17:09    Procedures Procedures  {Document cardiac monitor, telemetry assessment procedure when appropriate:1}  Medications Ordered  in ED Medications - No data to display  ED Course/ Medical Decision Making/ A&P   {   Click here for ABCD2, HEART and other calculatorsREFRESH Note before signing :1}                              Medical Decision Making Amount and/or Complexity of Data Reviewed Labs: ordered. Radiology: ordered.   ***  {Document critical care time when appropriate:1} {Document review of labs and clinical decision tools ie heart score, Chads2Vasc2 etc:1}  {Document your independent review of radiology images, and any outside records:1} {Document your discussion with family members, caretakers, and with consultants:1} {Document social determinants of health affecting pt's care:1} {Document your decision making why or why not admission, treatments were needed:1} Final Clinical Impression(s) / ED Diagnoses Final diagnoses:  None    Rx / DC Orders ED Discharge Orders     None

## 2023-04-27 DIAGNOSIS — R2689 Other abnormalities of gait and mobility: Secondary | ICD-10-CM | POA: Diagnosis not present

## 2023-04-27 NOTE — ED Provider Notes (Incomplete)
[place] EMERGENCY DEPARTMENT AT [facility] Provider Note   CSN: [id number] Arrival date & time: 04/26/23  1450     History {Add pertinent medical, surgical, social history, OB history to HPI:1} Chief Complaint  Patient presents with  . Fall    Margaret Norman is a 72 y.o. female,  Reports fall on Tuesday, tripped over dog toy per pt, daughter states unstable--uses cane, did not hit head, laid on the ground   Saw PCP today,   Reports increased difficulty with moving 2/2 to weakness      Home Medications Prior to Admission medications   Medication Sig Start Date End Date Taking? Authorizing Provider  acetaminophen (TYLENOL) 500 MG tablet Take 1,000 mg by mouth every 6 (six) hours as needed for moderate pain.    [provider]  anastrozole (ARIMIDEX) 1 MG tablet Take 1 tablet (1 mg total) by mouth daily. 04/09/22   Serena Croissant, MD  aspirin 81 MG EC tablet Take 81 mg by mouth daily.    [provider]  Blood Glucose Monitoring Suppl (ONETOUCH VERIO FLEX SYSTEM) W/DEVICE KIT 1 kit by Does not apply route 3 (three) times daily. 01/12/15   Etta Grandchild, MD  buPROPion (WELLBUTRIN XL) 300 MG 24 hr tablet Take 1 tablet (300 mg total) by mouth every morning. 03/21/23   Olive Bass, FNP  carvedilol (COREG) 6.25 MG tablet Take 1 tablet (6.25 mg total) by mouth 2 (two) times daily with a meal. 10/30/22   Laurey Morale, MD  Cholecalciferol (VITAMIN D) 50 MCG (2000 UT) tablet Take 2,000 Units by mouth daily.    [provider]  Continuous Blood Gluc Receiver (FREESTYLE LIBRE 2 READER) DEVI 1 each by Does not apply route daily. 01/17/22   Carlus Pavlov, MD  Continuous Blood Gluc Sensor (FREESTYLE LIBRE 2 SENSOR) MISC APPLY AS DIRECTED EVERY 14 DAYS 05/21/22   Carlus Pavlov, MD  ENTRESTO 24-26 MG TAKE 1 TABLET BY MOUTH TWICE DAILY 08/24/22   Laurey Morale, MD  fluticasone [facility]) 50 MCG/ACT nasal spray Place 2 sprays into both  nostrils daily. 02/20/22   Olive Bass, FNP  furosemide (LASIX) 40 MG tablet Take 1 tablet (40 mg total) by mouth daily. 10/17/22 01/15/23  Jacklynn Ganong, FNP  glucose blood (ONETOUCH VERIO) test strip Test three times daily as directed. DX: E11.9 08/29/20   Carlus Pavlov, MD  Insulin Pen Needle 32G X 4 MM MISC 1 each by Other route daily. E11.9; THIS IS THE CORRECT PEN NEEDLE. PLEASE FILL RX AS ORDERED 01/17/22   Carlus Pavlov, MD  JARDIANCE 25 MG TABS tablet TAKE 1 TABLET BY MOUTH DAILY 11/09/22   Carlus Pavlov, MD  LANTUS SOLOSTAR 100 UNIT/ML Solostar Pen INJECT SUBCUTANEOUSLY 12-14  UNITS AT BEDTIME 02/12/23   Carlus Pavlov, MD  meclizine (ANTIVERT) 25 MG tablet Take 25 mg by mouth 3 (three) times daily as needed for dizziness.    [provider]  metFORMIN (GLUCOPHAGE) 1000 MG tablet Take 1 tablet (1,000 mg total) by mouth 2 (two) times daily with a meal. 09/26/22   Carlus Pavlov, MD  omeprazole (PRILOSEC) 40 MG capsule Take 1 capsule (40 mg total) by mouth 2 (two) times daily. Please call [phone] to schedule an office visit for more refills 11/05/22   Unk Lightning, PA  OneTouch Delica Lancets 30G MISC 1 each by Does not apply route daily. E11.9 01/19/20   Carlus Pavlov, MD  potassium chloride SA (KLOR-CON M20)  20 MEQ tablet Take 1 tablet (20 mEq total) by mouth daily for 3 days. 10/16/22 10/19/22  Jacklynn Ganong, FNP  Probiotic Product (PROBIOTIC ADVANCED PO) Take 1 tablet by mouth at bedtime.     [provider]  repaglinide (PRANDIN) 1 MG tablet Take 1 tablet (1 mg total) by mouth 3 (three) times daily before meals. 02/12/23   Carlus Pavlov, MD  rosuvastatin (CRESTOR) 10 MG tablet Take 1 tablet (10 mg total) by mouth daily. 07/18/22   Olive Bass, FNP  sertraline (ZOLOFT) 100 MG tablet Take 1 tablet (100 mg total) by mouth daily. 03/21/23   Olive Bass, FNP  spironolactone (ALDACTONE) 25 MG tablet TAKE 1  TABLET BY MOUTH DAILY 12/03/22   Laurey Morale, MD  TRULICITY 3 MG/0.5ML SOPN INJECT 3 MG (0.5 ML) UNDER THE SKIN ONCE A WEEK 05/24/22   Carlus Pavlov, MD  Vitamin E 180 MG CAPS Take 180 mg by mouth at bedtime.     [provider]      Allergies    Invokana [canagliflozin] and Amoxicillin    Review of Systems   Review of Systems  Physical Exam Updated Vital Signs BP 119/77 (BP Location: Right Arm)   Pulse 80   Temp 98 F (36.7 C) (Oral)   Resp 15   Ht 5\' 2"  (1.575 m)   Wt 58.2 kg   SpO2 100%   BMI 23.45 kg/m  Physical Exam  ED Results / Procedures / Treatments   Labs (all labs ordered are listed, but only abnormal results are displayed) Labs Reviewed  BASIC METABOLIC PANEL - Abnormal; Notable for the following components:      Result Value   Glucose, Bld 207 (*)    Creatinine, Ser 1.07 (*)    GFR, Estimated 55 (*)    All other components within normal limits  CBC - Abnormal; Notable for the following components:   WBC 17.8 (*)    Platelets 493 (*)    All other components within normal limits  TROPONIN I (HIGH SENSITIVITY)  TROPONIN I (HIGH SENSITIVITY)    EKG None  Radiology DG Chest 2 View  Result Date: 04/26/2023 CLINICAL DATA:  Intermittent chest pressure EXAM: CHEST - 2 VIEW COMPARISON:  04/15/2019 FINDINGS: No consolidation, pneumothorax or effusion. No edema. Normal cardiopericardial silhouette. Overlapping cardiac leads. Degenerative changes of the spine surgical clips along the right lower thorax and right upper quadrant. IMPRESSION: No acute cardiopulmonary disease. Electronically Signed   By: Karen Kays M.D.   On: 04/26/2023 18:15   CT Head Wo Contrast  Result Date: 04/26/2023 CLINICAL DATA:  Head trauma, minor (Age >= 65y) EXAM: CT HEAD WITHOUT CONTRAST TECHNIQUE: Contiguous axial images were obtained from the base of the skull through the vertex without intravenous contrast. RADIATION DOSE REDUCTION: This exam was performed according  to the departmental dose-optimization program which includes automated exposure control, adjustment of the mA and/or kV according to patient size and/or use of iterative reconstruction technique. COMPARISON:  CT head 06/30/2021. FINDINGS: Brain: No evidence of acute infarction, hemorrhage, hydrocephalus, extra-axial collection or mass lesion/mass effect. 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 Vascular: No hyperdense vessel. Skull: No acute fracture. Sinuses/Orbits: Clear sinuses.  No acute orbital findings. Other: No mastoid effusions. IMPRESSION: No evidence of acute intracranial abnormality. Electronically Signed   By: Feliberto Harts M.D.   On: 04/26/2023 17:09    Procedures Procedures  {Document cardiac monitor, telemetry assessment procedure when appropriate:1}  Medications Ordered in  ED Medications - No data to display  ED Course/ Medical Decision Making/ A&P   {   Click here for ABCD2, HEART and other calculatorsREFRESH Note before signing :1}                              Medical Decision Making Amount and/or Complexity of Data Reviewed Labs: ordered. Radiology: ordered.   ***  {Document critical care time when appropriate:1} {Document review of labs and clinical decision tools ie heart score, Chads2Vasc2 etc:1}  {Document your independent review of radiology images, and any outside records:1} {Document your discussion with family members, caretakers, and with consultants:1} {Document social determinants of health affecting pt's care:1} {Document your decision making why or why not admission, treatments were needed:1} Final Clinical Impression(s) / ED Diagnoses Final diagnoses:  None    Rx / DC Orders ED Discharge Orders     None

## 2023-04-27 NOTE — ED Notes (Signed)
Went to room to remind pt of need for urine sample, but upon entering room all pt belongings were gone and pt and family member were not in room. This RN and the NT, Lyric searched all hallways and bathrooms but did not see pt anywhere. B. Small, PA aware.

## 2023-04-29 ENCOUNTER — Telehealth: Payer: Self-pay | Admitting: *Deleted

## 2023-04-29 NOTE — Telephone Encounter (Signed)
   Pre-operative Risk Assessment    Patient Name: Margaret Norman  DOB: 1951-05-07 MRN: [id number]   DATE OF LAST VISIT: 05/03/22 DR. McLEAN DATE OF NEXT VISIT: 05/16/23 DR. McLEAN  Request for Surgical Clearance    Procedure:   LEFT TOTAL HIP   Date of Surgery:  Clearance TBD                                 Surgeon:  DR. Cheral Almas Surgeon's Group or Practice Name:  [facility] AT [facility] Phone number:  [phone] Fax number:  [phone]   Type of Clearance Requested:   - Medical  - Pharmacy:  Hold Aspirin     Type of Anesthesia:  Spinal   Additional requests/questions:    SignedDanielle Rankin   04/29/2023, 1:25 PM

## 2023-04-29 NOTE — Telephone Encounter (Signed)
   Name: JANITA CAMBEROS  DOB: 04-21-1951  MRN: [id number]  Primary Cardiologist: Kristeen Miss, MD  Chart reviewed as part of pre-operative protocol coverage. Because of Gwendolynn C Ruybal's past medical history and time since last visit, she will require a follow-up in-office visit in order to better assess preoperative cardiovascular risk.  Patient has an office visit scheduled on 05/16/2023 with Dr. Shirlee Latch. Appointment notes have been updated to reflect need for pre-op evaluation.   Pre-op covering staff:  - Please contact requesting surgeon's office via preferred method (i.e, phone, fax) to inform them of need for appointment prior to surgery.   Carlos Levering, NP  04/29/2023, 5:20 PM

## 2023-05-01 ENCOUNTER — Other Ambulatory Visit (INDEPENDENT_AMBULATORY_CARE_PROVIDER_SITE_OTHER): Payer: PPO

## 2023-05-01 DIAGNOSIS — R899 Unspecified abnormal finding in specimens from other organs, systems and tissues: Secondary | ICD-10-CM | POA: Diagnosis not present

## 2023-05-01 LAB — CBC WITH DIFFERENTIAL/PLATELET
Basophils Absolute: 0.1 K/uL (ref 0.0–0.1)
Basophils Relative: 0.4 % (ref 0.0–3.0)
Eosinophils Absolute: 0.3 K/uL (ref 0.0–0.7)
Eosinophils Relative: 2 % (ref 0.0–5.0)
HCT: 38.7 % (ref 36.0–46.0)
Hemoglobin: 12.2 g/dL (ref 12.0–15.0)
Lymphocytes Relative: 27 % (ref 12.0–46.0)
Lymphs Abs: 3.4 K/uL (ref 0.7–4.0)
MCHC: 31.4 g/dL (ref 30.0–36.0)
MCV: 91.8 fl (ref 78.0–100.0)
Monocytes Absolute: 1 K/uL (ref 0.1–1.0)
Monocytes Relative: 8.2 % (ref 3.0–12.0)
Neutro Abs: 7.9 K/uL — ABNORMAL HIGH (ref 1.4–7.7)
Neutrophils Relative %: 62.4 % (ref 43.0–77.0)
Platelets: 488 K/uL — ABNORMAL HIGH (ref 150.0–400.0)
RBC: 4.22 Mil/uL (ref 3.87–5.11)
RDW: 13.6 % (ref 11.5–15.5)
WBC: 12.7 K/uL — ABNORMAL HIGH (ref 4.0–10.5)

## 2023-05-01 LAB — VITAMIN B12: Vitamin B-12: 1082 pg/mL — ABNORMAL HIGH (ref 211–911)

## 2023-05-03 ENCOUNTER — Encounter: Payer: Self-pay | Admitting: Neurology

## 2023-05-03 ENCOUNTER — Other Ambulatory Visit: Payer: Self-pay | Admitting: Internal Medicine

## 2023-05-03 ENCOUNTER — Other Ambulatory Visit: Payer: Self-pay | Admitting: Family

## 2023-05-03 DIAGNOSIS — R2689 Other abnormalities of gait and mobility: Secondary | ICD-10-CM

## 2023-05-03 DIAGNOSIS — G629 Polyneuropathy, unspecified: Secondary | ICD-10-CM

## 2023-05-06 ENCOUNTER — Encounter (HOSPITAL_COMMUNITY): Payer: Self-pay | Admitting: Cardiology

## 2023-05-10 ENCOUNTER — Inpatient Hospital Stay: Payer: PPO | Attending: Hematology and Oncology | Admitting: Hematology and Oncology

## 2023-05-10 VITALS — BP 96/58 | HR 89 | Temp 97.3°F | Resp 18 | Ht 62.0 in | Wt 126.6 lb

## 2023-05-10 DIAGNOSIS — M25559 Pain in unspecified hip: Secondary | ICD-10-CM | POA: Insufficient documentation

## 2023-05-10 DIAGNOSIS — Z79811 Long term (current) use of aromatase inhibitors: Secondary | ICD-10-CM | POA: Insufficient documentation

## 2023-05-10 DIAGNOSIS — C50411 Malignant neoplasm of upper-outer quadrant of right female breast: Secondary | ICD-10-CM

## 2023-05-10 DIAGNOSIS — Z170 Estrogen receptor positive status [ER+]: Secondary | ICD-10-CM | POA: Diagnosis not present

## 2023-05-10 DIAGNOSIS — E119 Type 2 diabetes mellitus without complications: Secondary | ICD-10-CM | POA: Insufficient documentation

## 2023-05-10 MED ORDER — ANASTROZOLE 1 MG PO TABS: 1.0000 mg | ORAL_TABLET | Freq: Every day | ORAL | 3 refills | Status: DC

## 2023-05-10 NOTE — Progress Notes (Signed)
Patient Care Team: Olive Bass, FNP as PCP - General (Internal Medicine) [name], Deloris Ping, MD as PCP - Cardiology (Cardiology) Pershing Proud, RN as Oncology Nurse Navigator Donnelly Angelica, RN as Oncology Nurse Navigator Laurey Morale, MD as Consulting Physician (Cardiology)  DIAGNOSIS:  Encounter Diagnosis  Name Primary?   Malignant neoplasm of upper-outer quadrant of right breast in female, estrogen receptor positive (HCC) Yes    SUMMARY OF ONCOLOGIC HISTORY: Oncology History  Malignant neoplasm of upper-outer quadrant of right breast in female, estrogen receptor positive (HCC)  03/16/2019 Cancer Staging   Staging form: Breast, AJCC 8th Edition - Clinical stage from 03/16/2019: Stage IA (cT1c, cN0, cM0, G2, ER+, PR+, HER2+) - Signed by Loa Socks, NP on 03/25/2019   03/25/2019 Initial Diagnosis   Routine screening mammogram detected a 1.3cm mass in the right breast, 9 o'clock position, no axillary adenopathy. 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    04/15/2019 Surgery   Right lumpectomy Magnus Ivan): IDC, grade 2, 1.3cm, with intermediate grade DCIS, clear margins, and 1 right axillary lymph node negative   04/15/2019 Cancer Staging   Staging form: Breast, AJCC 8th Edition - Pathologic stage from 04/15/2019: Stage IA (pT1c, pN0, cM0, G2, ER+, PR+, HER2+) - Signed by Loa Socks, NP on 04/29/2019   05/13/2019 -  Chemotherapy   Weekly Taxol and Herceptin x 12, then maintenance Herceptin every 3 weeks x 1 year   08/20/2019 -  Radiation Therapy   Adjuvant radiation   09/2019 -  Anti-estrogen oral therapy   Anastrozole     CHIEF COMPLIANT: Follow-up on anastrozole therapy  HISTORY OF PRESENT ILLNESS:   History of Present Illness   The patient, a breast cancer survivor, has been on anastrozole for the past three and a half years since her diagnosis. She reports no issues with taking  the medication and denies experiencing any hot flashes. She has been diligent with her mammograms, which have been consistently good.  In addition to her cancer history, the patient also has diabetes. She has recently switched from using a blood glucose one touch to a freestone patch for monitoring her blood glucose levels. She finds the patch more convenient and less painful than pricking her fingers. She is able to read the results using her phone.  The patient also mentions that she is due for a hip replacement due to significant pain, which has limited her ability to exercise. Despite her medical conditions, she appears to be in good spirits and is looking forward to the upcoming holiday season.         ALLERGIES:  is allergic to invokana [canagliflozin] and amoxicillin.  MEDICATIONS:  Current Outpatient Medications  Medication Sig Dispense Refill   acetaminophen (TYLENOL) 500 MG tablet Take 1,000 mg by mouth every 6 (six) hours as needed for moderate pain.     anastrozole (ARIMIDEX) 1 MG tablet Take 1 tablet (1 mg total) by mouth daily. 90 tablet 3   aspirin 81 MG EC tablet Take 81 mg by mouth daily.     buPROPion (WELLBUTRIN XL) 300 MG 24 hr tablet Take 1 tablet (300 mg total) by mouth every morning. 90 tablet 1   carvedilol (COREG) 6.25 MG tablet Take 1 tablet (6.25 mg total) by mouth 2 (two) times daily with a meal. 180 tablet 3   Cholecalciferol (VITAMIN D) 50 MCG (2000 UT) tablet Take 2,000 Units by mouth daily.  Continuous Blood Gluc Receiver (FREESTYLE LIBRE 2 READER) DEVI 1 each by Does not apply route daily. 6 each 3   Continuous Glucose Sensor (FREESTYLE LIBRE 2 SENSOR) MISC APPLY AS DIRECTED EVERY 14 DAYS 6 each 3   ENTRESTO 24-26 MG TAKE 1 TABLET BY MOUTH TWICE DAILY 60 tablet 11   fluticasone (FLONASE) 50 MCG/ACT nasal spray Place 2 sprays into both nostrils daily. 16 g 1   furosemide (LASIX) 40 MG tablet Take 1 tablet (40 mg total) by mouth daily. 90 tablet 3   Insulin  Pen Needle 32G X 4 MM MISC 1 each by Other route daily. E11.9; THIS IS THE CORRECT PEN NEEDLE. PLEASE FILL RX AS ORDERED 100 each 3   JARDIANCE 25 MG TABS tablet TAKE 1 TABLET BY MOUTH DAILY 90 tablet 3   LANTUS SOLOSTAR 100 UNIT/ML Solostar Pen INJECT SUBCUTANEOUSLY 12-14  UNITS AT BEDTIME     meclizine (ANTIVERT) 25 MG tablet Take 25 mg by mouth 3 (three) times daily as needed for dizziness.     metFORMIN (GLUCOPHAGE) 1000 MG tablet Take 1 tablet (1,000 mg total) by mouth 2 (two) times daily with a meal. 180 tablet 3   omeprazole (PRILOSEC) 40 MG capsule Take 1 capsule (40 mg total) by mouth 2 (two) times daily. Please call [phone] to schedule an office visit for more refills 60 capsule 3   potassium chloride SA (KLOR-CON M20) 20 MEQ tablet Take 1 tablet (20 mEq total) by mouth daily for 3 days. 3 tablet 0   Probiotic Product (PROBIOTIC ADVANCED PO) Take 1 tablet by mouth at bedtime.      repaglinide (PRANDIN) 1 MG tablet Take 1 tablet (1 mg total) by mouth 3 (three) times daily before meals. 90 tablet 3   rosuvastatin (CRESTOR) 10 MG tablet Take 1 tablet (10 mg total) by mouth daily. 30 tablet 11   sertraline (ZOLOFT) 100 MG tablet Take 1 tablet (100 mg total) by mouth daily. 90 tablet 3   spironolactone (ALDACTONE) 25 MG tablet TAKE 1 TABLET BY MOUTH DAILY 90 tablet 3   TRULICITY 3 MG/0.5ML SOPN INJECT 3 MG (0.5 ML) UNDER THE SKIN ONCE A WEEK 8 mL 0   Vitamin E 180 MG CAPS Take 180 mg by mouth at bedtime.      No current facility-administered medications for this visit.    PHYSICAL EXAMINATION: ECOG PERFORMANCE STATUS: 1 - Symptomatic but completely ambulatory  Vitals:   05/10/23 0933  BP: (!) 96/58  Pulse: 89  Resp: 18  Temp: (!) 97.3 F (36.3 C)  SpO2: 100%   Filed Weights   05/10/23 0933  Weight: 126 lb 9.6 oz (57.4 kg)    Physical Exam          (exam performed in the presence of a chaperone)  LABORATORY DATA:  I have reviewed the data as listed    Latest Ref  Rng & Units 04/26/2023    3:50 PM 03/21/2023   10:55 AM 10/16/2022    9:22 AM  CMP  Glucose 70 - 99 mg/dL 829  98  562   BUN 8 - 23 mg/dL 18  20  17    Creatinine 0.44 - 1.00 mg/dL 1.30  8.65  7.84   Sodium 135 - 145 mmol/L 139  143  139   Potassium 3.5 - 5.1 mmol/L 3.9  4.6  4.6   Chloride 98 - 111 mmol/L 102  105  104   CO2 22 - 32 mmol/L 26  27  26   Calcium 8.9 - 10.3 mg/dL 9.9  44.0  9.9   Total Protein 6.0 - 8.3 g/dL  6.8    Total Bilirubin 0.2 - 1.2 mg/dL  0.4    Alkaline Phos 39 - 117 U/L  59    AST 0 - 37 U/L  10    ALT 0 - 35 U/L  8      Lab Results  Component Value Date   WBC 12.7 (H) 05/01/2023   HGB 12.2 05/01/2023   HCT 38.7 05/01/2023   MCV 91.8 05/01/2023   PLT 488.0 (H) 05/01/2023   NEUTROABS 7.9 (H) 05/01/2023    ASSESSMENT & PLAN:  Malignant neoplasm of upper-outer quadrant of right breast in female, estrogen receptor positive (HCC) Routine screening mammogram detected a 1.3cm mass in the right breast, 9 o'clock position, no axillary adenopathy. 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  T1c N0 stage Ia   Recommendations: 1. 04/15/19: Breast conserving surgery: Right lumpectomy Magnus Ivan): IDC, grade 2, 1.3cm, with intermediate grade DCIS, clear margins, and 1 right axillary lymph node negative 2. Adj chemo with Taxol-Herceptin foll by Herceptin maintenance for 1 year started 05/13/2019, discontinued February 2021 for low ejection fraction 3. Adjuvant radiation therapy 08/20/2019-09/14/2019 4. Adjuvant antiestrogen therapy with anastrozole 1 mg daily started April 2021   Enrolled in SWOG 1714 trials.  ------------------------------------------------------------------------------------------------------------------------- Current treatment: Anastrozole 1 mg daily started April 2021 Echo EF 55 to 60% 04/15/2019 EF 30 to 35% on 07/20/2019 EF 45% 10/10/2020   Anastrozole toxicities: Denies any hot flashes or  arthralgias or myalgias..   Breast cancer surveillance: 1.  Breast exam 05/10/2023: Benign, slight tenderness in the right axilla 2. mammogram 07/12/2022: Benign breast density category A Brain MRI 09/09/2020: Benign   Bone density 05/31/2020: T score -1.1: (Mild osteopenia): Continue with calcium and vitamin D   Return to clinic in 1 year for follow-up ------------------------------------- Assessment and Plan    Breast Cancer In remission for approximately 3.5 years. Tolerating Anastrozole well with no reported side effects. Mammograms have been consistently normal. -Continue Anastrozole as prescribed. -Continue regular mammograms as per guidelines.  Diabetes Transitioned from fingerstick glucose monitoring to [facility] system. Reports improved comfort and ease of use. -Continue Freestyle Libre system for glucose monitoring. -Discontinue One Touch and lancets.  Hip Pain Reports significant hip pain, limiting mobility and exercise. Plans for hip replacement. -Encourage patient to proceed with hip replacement as planned to improve mobility and quality of life.  Follow-up in 1 year. Continue current management plan.          No orders of the defined types were placed in this encounter.  The patient has a good understanding of the overall plan. she agrees with it. she will call with any problems that may develop before the next visit here. Total time spent: 30 mins including face to face time and time spent for planning, charting and co-ordination of care   Tamsen Meek, MD 05/10/23

## 2023-05-10 NOTE — Assessment & Plan Note (Addendum)
Routine screening mammogram detected a 1.3cm mass in the right breast, 9 o'clock position, no axillary adenopathy. 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  T1c N0 stage Ia   Recommendations: 1. 04/15/19: Breast conserving surgery: Right lumpectomy Margaret Norman): IDC, grade 2, 1.3cm, with intermediate grade DCIS, clear margins, and 1 right axillary lymph node negative 2. Adj chemo with Taxol-Herceptin foll by Herceptin maintenance for 1 year started 05/13/2019, discontinued February 2021 for low ejection fraction 3. Adjuvant radiation therapy 08/20/2019-09/14/2019 4. Adjuvant antiestrogen therapy with anastrozole 1 mg daily started April 2021   Enrolled in SWOG 1714 trials.  ------------------------------------------------------------------------------------------------------------------------- Current treatment: Anastrozole 1 mg daily started April 2021 Echo EF 55 to 60% 04/15/2019 EF 30 to 35% on 07/20/2019 EF 45% 10/10/2020   Anastrozole toxicities: Denies any hot flashes or arthralgias or myalgias..   Breast cancer surveillance: 1.  Breast exam 05/10/2023: Benign, slight tenderness in the right axilla 2. mammogram 07/12/2022: Benign breast density category A Brain MRI 09/09/2020: Benign   Bone density 05/31/2020: T score -1.1: (Mild osteopenia): Continue with calcium and vitamin D   Return to clinic in 1 year for follow-up

## 2023-05-16 ENCOUNTER — Ambulatory Visit (HOSPITAL_COMMUNITY)
Admission: RE | Admit: 2023-05-16 | Discharge: 2023-05-16 | Disposition: A | Discharge status: Home / Self Care | Payer: PPO | Source: Ambulatory Visit | Attending: Cardiology | Admitting: Cardiology

## 2023-05-16 ENCOUNTER — Encounter (HOSPITAL_COMMUNITY): Payer: Self-pay | Admitting: Cardiology

## 2023-05-16 VITALS — BP 118/78 | HR 88 | Wt 129.0 lb

## 2023-05-16 DIAGNOSIS — R5383 Other fatigue: Secondary | ICD-10-CM | POA: Insufficient documentation

## 2023-05-16 DIAGNOSIS — I447 Left bundle-branch block, unspecified: Secondary | ICD-10-CM | POA: Insufficient documentation

## 2023-05-16 DIAGNOSIS — E119 Type 2 diabetes mellitus without complications: Secondary | ICD-10-CM | POA: Insufficient documentation

## 2023-05-16 DIAGNOSIS — R9431 Abnormal electrocardiogram [ECG] [EKG]: Secondary | ICD-10-CM | POA: Diagnosis not present

## 2023-05-16 DIAGNOSIS — R0683 Snoring: Secondary | ICD-10-CM | POA: Diagnosis not present

## 2023-05-16 DIAGNOSIS — I5022 Chronic systolic (congestive) heart failure: Secondary | ICD-10-CM | POA: Insufficient documentation

## 2023-05-16 DIAGNOSIS — I455 Other specified heart block: Secondary | ICD-10-CM | POA: Diagnosis not present

## 2023-05-16 DIAGNOSIS — I428 Other cardiomyopathies: Secondary | ICD-10-CM | POA: Insufficient documentation

## 2023-05-16 DIAGNOSIS — Z853 Personal history of malignant neoplasm of breast: Secondary | ICD-10-CM | POA: Diagnosis not present

## 2023-05-16 LAB — IRON AND TIBC
Iron: 111 ug/dL (ref 28–170)
Saturation Ratios: 30 % (ref 10.4–31.8)
TIBC: 375 ug/dL (ref 250–450)
UIBC: 264 ug/dL

## 2023-05-16 LAB — BRAIN NATRIURETIC PEPTIDE: B Natriuretic Peptide: 25.8 pg/mL (ref 0.0–100.0)

## 2023-05-16 LAB — FERRITIN: Ferritin: 37 ng/mL (ref 11–307)

## 2023-05-16 MED ORDER — FUROSEMIDE 40 MG PO TABS: 40.0000 mg | ORAL_TABLET | ORAL | Status: AC | PRN

## 2023-05-16 NOTE — Progress Notes (Signed)
PCP: Olive Bass, FNP Oncology: Dr. Pamelia Hoit HF cardiology: Dr. Shirlee Latch   72 y.o. with history of DM, chronic LBBB, and breast cancer was referred by Dr. Pamelia Hoit for workup of fall in EF.  She was diagnosed with right breast cancer, ER+/PR+/HER2+ in 9/20.  She had right lumpectomy in 10/20.  Starting in 11/20, she had been getting 12 cycles of Taxol/Herceptin to be followed by Herceptin alone to complete a year. Echo in 10/20 showed EF 55-60% per report (looked more like 50% on my review, likely due to septal-lateral dyssynchrony).  Her repeat echo for Herceptin screening was done in 1/21, showing EF down to 30-35% with septal-lateral dyssynchrony.  Herceptin has been stopped for now.  She is currently undergoing radiation therapy.   Echo in 3/21 showed that EF remains 30-35% with prominent septal-lateral dyssynchrony.  Patient has a strong family history of heart problems.  Father died from "ruptured aorta."  Sister had heart valve repair, one brother has a pacemaker, and another brother has AL amyloidosis.   LHC/RHC was done in 4/21. This showed normal filling pressures, preserved cardiac output, and no significant CAD. Cardiac MRI in 5/21 showed EF 36% with septal-lateral dyssynchrony, RV EF normal 45%, no LGE.  Echo in 10/21 showed EF 40-45% with septal-lateral dyssynchrony.   Echo in 4/22 showed EF 45% with septal-lateral dyssynchrony, normal RV.  Echo in 4/23 showed EF 45% with septal-lateral dyssynchrony, normal RV, IVC normal.   Echo in 5/24 showed EF 45-50%, septal-lateral dyssynchrony, RV normal, IVC normal.   Today she returns for HF follow up. Weight down 6 lbs. She is generally fatigued with exertion.  She has poor balance and has had mechanical falls. She uses a walker for balance. She is lightheaded at times with standing but not typically.  Generally not short of breath walking around house.  Rare atypical chest pain. She is not using Lasix.   Labs (1/21): K 3.8, creatinine  0.75 Labs (3/21): K 4.5, creatinine 0.71 Labs (4/21): K 4.3, creatinine 0.82 => 0.83 Labs (8/21): hgb 12.7, K 4.7, creatinine 0.85 Labs (10/21): K 4.2, creatinine 0.92 Labs (1/22): LDL 37, HDL 47, K 4.3, creatinine 1.61 Labs (1/23): K 4.3, creatinine 0.86, hgb 12, LDL 47 Labs (5/23): K 4.2, creatinine 0.66 Labs (11/23): K 4.2, creatinine 1.0 Labs (9/24): LDL 28, TSH normal Labs (11/24): K 3.9, creatinine 1.07, hgb 12.2  ECG (personally reviewed): NSR, IVCD 120 msec  PMH: 1. Depression 2. Type 2 diabetes 3. Chronic LBBB 4. H/o transient complete heart block: in 10/20 when she was having port-a-cath placed.  5. HTN 6. Hyperlipidemia.  7. Breast cancer: She was diagnosed with right breast cancer, ER+/PR+/HER2+ in 9/20.  She had right lumpectomy in 10/20.  Starting in 11/20, she has been getting 12 cycles of Taxol/Herceptin to be followed by Herceptin alone to complete a year.  8. Chronic systolic CHF: She has had mildly decreased EF in the past, possibly related to LBBB.  Nonischemic cardiomyopathy.  - Echo (4/17): EF 50-55% - Cardiolite (4/17): EF 48% - Echo (10/20): EF 55-60% (reviewed, think more like 50% but technically difficult).  - Echo (1/21): EF 30-35%, mild LVH, septal dyssynchrony.  - Echo (3/21): EF 30-35% with prominent septal-lateral dyssynchrony.  - LHC/RHC (4/21): mean RA 1, PA 13/3, mean PCWP 2, CI 3.74, no significant CAD.  - Cardiac MRI (5/21): EF 36% with septal-lateral dyssynchrony, RV EF normal 45%, no LGE.  - Echo (10/21): EF 40-45%, septal-lateral dyssynchrony.  - Echo (4/22):  EF 45% with septal-lateral dyssynchrony, normal RV. - Echo (4/23): EF 45% with septal-lateral dyssynchrony, normal RV, IVC normal.  - Echo (5/24): EF 45-50%, septal-lateral dyssynchrony, RV normal, IVC normal.   Social History   Socioeconomic History   Marital status: Married    Spouse name: Vernadean Bingenheimer   Number of children: 2   Years of education: Not on file   Highest  education level: 12th grade  Occupational History   Occupation: CSR    Employer: [employer]   Occupation: retired  Tobacco Use   Smoking status: Never   Smokeless tobacco: Never  Vaping Use   Vaping status: Never Used  Substance and Sexual Activity   Alcohol use: No   Drug use: No   Sexual activity: Not Currently    Birth control/protection: Pill    Comment: Tubes tied  Other Topics Concern   Not on file  Social History Narrative   Regular Exercise -  NO   Social Determinants of Health   Financial Resource Strain: Low Risk  (04/26/2023)   Overall Financial Resource Strain (CARDIA)    Difficulty of Paying Living Expenses: Not very hard  Food Insecurity: No Food Insecurity (04/26/2023)   Hunger Vital Sign    Worried About Running Out of Food in the Last Year: Never true    Ran Out of Food in the Last Year: Never true  Recent Concern: Food Insecurity - Food Insecurity Present (03/14/2023)   Hunger Vital Sign    Worried About Running Out of Food in the Last Year: Never true    Ran Out of Food in the Last Year: Sometimes true  Transportation Needs: No Transportation Needs (04/26/2023)   PRAPARE - Administrator, Civil Service (Medical): No    Lack of Transportation (Non-Medical): No  Physical Activity: Inactive (04/26/2023)   Exercise Vital Sign    Days of Exercise per Week: 0 days    Minutes of Exercise per Session: 10 min  Stress: Stress Concern Present (04/26/2023)   [employer] of Occupational Health - Occupational Stress Questionnaire    Feeling of Stress : To some extent  Social Connections: Moderately Isolated (04/26/2023)   Social Connection and Isolation Panel [NHANES]    Frequency of Communication with Friends and Family: More than three times a week    Frequency of Social Gatherings with Friends and Family: Three times a week    Attends Religious Services: Never    Active Member of Clubs or Organizations: No    Attends Banker  Meetings: Never    Marital Status: Married  Catering manager Violence: Not At Risk (12/18/2022)   Humiliation, Afraid, Rape, and Kick questionnaire    Fear of Current or Ex-Partner: No    Emotionally Abused: No    Physically Abused: No    Sexually Abused: No   Family History  Problem Relation Age of Onset   Liver disease Mother    Dementia Mother    Diabetes Mother        borderline   Depression Mother    Coronary artery disease Father    Heart attack Father    Hypertension Father    Heart disease Father    Early death Father    Cancer Other        leukemia   Stroke Maternal Grandfather    Alcohol abuse Maternal Grandfather    Hyperlipidemia Brother        Amyloidosis   Cancer Brother  Hypertension Brother    Diabetes Maternal Grandmother    Cancer Paternal Uncle        unknown   Heart attack Paternal Grandmother    Heart attack Paternal Uncle    Hypertension Brother    Diabetes Daughter        borderline   Diabetes Sister    Heart disease Sister    Kidney disease Sister    Heart disease Brother    Colon cancer Neg Hx    Esophageal cancer Neg Hx    Rectal cancer Neg Hx    Stomach cancer Neg Hx    Current Outpatient Medications  Medication Sig Dispense Refill   acetaminophen (TYLENOL) 500 MG tablet Take 1,000 mg by mouth every 6 (six) hours as needed for moderate pain.     anastrozole (ARIMIDEX) 1 MG tablet Take 1 tablet (1 mg total) by mouth daily. 90 tablet 3   aspirin 81 MG EC tablet Take 81 mg by mouth daily.     buPROPion (WELLBUTRIN XL) 300 MG 24 hr tablet Take 1 tablet (300 mg total) by mouth every morning. 90 tablet 1   carvedilol (COREG) 6.25 MG tablet Take 1 tablet (6.25 mg total) by mouth 2 (two) times daily with a meal. 180 tablet 3   Cholecalciferol (VITAMIN D) 50 MCG (2000 UT) tablet Take 2,000 Units by mouth daily.     Continuous Blood Gluc Receiver (FREESTYLE LIBRE 2 READER) DEVI 1 each by Does not apply route daily. 6 each 3   Continuous Glucose  Sensor (FREESTYLE LIBRE 2 SENSOR) MISC APPLY AS DIRECTED EVERY 14 DAYS 6 each 3   ENTRESTO 24-26 MG TAKE 1 TABLET BY MOUTH TWICE DAILY 60 tablet 11   fluticasone (FLONASE) 50 MCG/ACT nasal spray Place 2 sprays into both nostrils as needed for allergies or rhinitis.     Insulin Pen Needle 32G X 4 MM MISC 1 each by Other route daily. E11.9; THIS IS THE CORRECT PEN NEEDLE. PLEASE FILL RX AS ORDERED 100 each 3   JARDIANCE 25 MG TABS tablet TAKE 1 TABLET BY MOUTH DAILY 90 tablet 3   LANTUS SOLOSTAR 100 UNIT/ML Solostar Pen INJECT SUBCUTANEOUSLY 12-14  UNITS AT BEDTIME     meclizine (ANTIVERT) 25 MG tablet Take 25 mg by mouth 3 (three) times daily as needed for dizziness.     metFORMIN (GLUCOPHAGE) 1000 MG tablet Take 1 tablet (1,000 mg total) by mouth 2 (two) times daily with a meal. 180 tablet 3   omeprazole (PRILOSEC) 40 MG capsule Take 1 capsule (40 mg total) by mouth 2 (two) times daily. Please call [phone] to schedule an office visit for more refills 60 capsule 3   Probiotic Product (PROBIOTIC ADVANCED PO) Take 1 tablet by mouth at bedtime.      repaglinide (PRANDIN) 1 MG tablet Take 1 tablet (1 mg total) by mouth 3 (three) times daily before meals. 90 tablet 3   rosuvastatin (CRESTOR) 10 MG tablet Take 1 tablet (10 mg total) by mouth daily. 30 tablet 11   sertraline (ZOLOFT) 100 MG tablet Take 1 tablet (100 mg total) by mouth daily. 90 tablet 3   spironolactone (ALDACTONE) 25 MG tablet TAKE 1 TABLET BY MOUTH DAILY 90 tablet 3   TRULICITY 3 MG/0.5ML SOPN INJECT 3 MG (0.5 ML) UNDER THE SKIN ONCE A WEEK 8 mL 0   Vitamin E 180 MG CAPS Take 180 mg by mouth at bedtime.      furosemide (LASIX) 40 MG tablet Take  1 tablet (40 mg total) by mouth as needed. For weight gain of 3ln in 24 hours or 5lb in a week     No current facility-administered medications for this encounter.   Wt Readings from Last 3 Encounters:  05/16/23 58.5 kg (129 lb)  05/10/23 57.4 kg (126 lb 9.6 oz)  04/26/23 58.2 kg (128 lb  3.2 oz)   No data found.   BP 118/78   Pulse 88   Wt 58.5 kg (129 lb)   SpO2 98%   BMI 23.59 kg/m  General: NAD Neck: No JVD, no thyromegaly or thyroid nodule.  Lungs: Clear to auscultation bilaterally with normal respiratory effort. CV: Nondisplaced PMI.  Heart regular S1/S2, no S3/S4, no murmur.  No peripheral edema.  No carotid bruit.  Normal pedal pulses.  Abdomen: Soft, nontender, no hepatosplenomegaly, no distention.  Skin: Intact without lesions or rashes.  Neurologic: Alert and oriented x 3.  Psych: Normal affect. Extremities: No clubbing or cyanosis.  HEENT: Normal.   Assessment/Plan: 1. Chronic systolic CHF:  Nonischemic cardiomyopathy.  Echo in 1/21 showed EF down to 30-35% with septal-lateral dyssynchrony.  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  Based on the timeline, the most likely cause for fall in EF seems to be Herceptin use.  She is now off Herceptin, but echo in 3/21 showed EF still in the 30-35% range.  LHC/RHC in 4/21 showed low filling pressures, preserved cardiac output, and no significant CAD.  Cardiac MRI in 5/21 showed EF 36%, no LGE (no evidence for infiltrative disease). Of note, brother died of sudden death so also consider familial cardiomyopathy.  Echo in 10/21 showed EF up a bit to 40-45% with dyssynchrony due to LBBB.  Echo in 4/22 showed EF 45%, septal-lateral dyssynchrony. Echo in 4/23 showed EF 45% with septal-lateral dyssynchrony.  Echo in 5/24 showed EF 45-50%, septal-lateral dyssynchrony, RV normal, IVC normal.   NYHA class II-early III symptoms, fatigue/deconditioning and imbalance seem to be her major limiters.  She is not volume overloaded on exam and weight is down.  - She does not need regular Lasix.  - Continue Entresto at 24/26 bid and Coreg at 6.25 mg bid.  - Continue spironolactone 25 mg qhs.  - Continue Jardiance.  -  EF is now out of range for CRT-D.  2. 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  - Follow closely with use of Coreg.   3. LBBB: Chronic.  ?LBBB cardiomyopathy, though QRS today was not markedly prolonged today (120 msec).   4. Breast cancer: She has completed Herceptin. 5. Snoring and daytime fatigue: She snores, had a sleep study "years ago" and it was negative. - Recommend sleep study.  6. Fatigue: Likely multifactorial. - check iron studies today. - As above, arrange sleep study - Increase activity level, recommended [facility] PREP program.   Followup in 4 months with APP  Marca Ancona  05/16/2023

## 2023-05-16 NOTE — Patient Instructions (Addendum)
Take Lasix only as needed for weight gain of 3lb in 24 hours or 5lb in a month.  Labs done today, your results will be available in MyChart, we will contact you for abnormal readings.  You have been referred to the [facility] PREP program. They will call you to arrange your appointment.  Your physician recommends that you schedule a follow-up appointment in: 4 months.  If you have any questions or concerns before your next appointment please send [place] a message through [place] or call our office at [phone].    TO LEAVE A MESSAGE FOR THE NURSE SELECT OPTION 2, PLEASE LEAVE A MESSAGE INCLUDING: YOUR NAME DATE OF BIRTH CALL BACK NUMBER REASON FOR CALL**this is important as we prioritize the call backs  YOU WILL RECEIVE A CALL BACK THE SAME DAY AS LONG AS YOU CALL BEFORE 4:00 PM  At the [facility], you and your health needs are our priority. As part of our continuing mission to provide you with exceptional heart care, we have created designated Provider Care Teams. These Care Teams include your primary Cardiologist (physician) and Advanced Practice Providers (APPs- Physician Assistants and Nurse Practitioners) who all work together to provide you with the care you need, when you need it.   You may see any of the following providers on your designated Care Team at your next follow up: Dr Arvilla Meres Dr Marca Ancona Dr. Dorthula Nettles Dr. Clearnce Hasten Amy Filbert Schilder, NP Robbie Lis, [place] [facility] [place], [place] Brynda Peon, NP [place] Lee, NP Karle Plumber, PharmD   Please be sure to bring in all your medications bottles to every appointment.    Thank you for choosing [facility]

## 2023-05-20 ENCOUNTER — Encounter: Payer: Self-pay | Admitting: Family

## 2023-05-20 NOTE — Telephone Encounter (Signed)
Care team updated and letter sent for eye exam notes.

## 2023-05-21 ENCOUNTER — Telehealth: Payer: Self-pay

## 2023-05-21 NOTE — Telephone Encounter (Signed)
Call to pt to offer PREP at [facility] next class.  Would prefer to go to the [facility]. Advised we are not currently there and not sure when we will be. Will note that is what she prefers.  Given my number for call back.

## 2023-05-27 ENCOUNTER — Other Ambulatory Visit: Payer: Self-pay | Admitting: Internal Medicine

## 2023-05-27 DIAGNOSIS — E1165 Type 2 diabetes mellitus with hyperglycemia: Secondary | ICD-10-CM

## 2023-06-03 ENCOUNTER — Ambulatory Visit: Payer: PPO | Admitting: Neurology

## 2023-06-03 ENCOUNTER — Encounter: Payer: Self-pay | Admitting: Neurology

## 2023-06-03 VITALS — BP 114/77 | HR 85 | Ht 62.0 in | Wt 130.0 lb

## 2023-06-03 DIAGNOSIS — T451X5A Adverse effect of antineoplastic and immunosuppressive drugs, initial encounter: Secondary | ICD-10-CM | POA: Diagnosis not present

## 2023-06-03 DIAGNOSIS — G620 Drug-induced polyneuropathy: Secondary | ICD-10-CM | POA: Diagnosis not present

## 2023-06-03 DIAGNOSIS — R2681 Unsteadiness on feet: Secondary | ICD-10-CM | POA: Diagnosis not present

## 2023-06-03 NOTE — Progress Notes (Signed)
[facility] Clinic Note - Initial Visit   Date: 06/03/2023   Margaret Norman MRN: [id number] DOB: 1950-11-04   Dear Ria Clock, FNP:  Thank you for your kind referral of Margaret Norman for consultation of neuropathy. Although her history is well known to you, please allow [place] to reiterate it for the purpose of our medical record. The patient was accompanied to the clinic by self.    Margaret Norman is a 72 y.o. right-handed female with right breast cancer s/p lumpectomy and chemotherapy, diabetes mellitus, CHF, hypertension, GERD, hyperlipidemia, and left hip OA presenting for evaluation of gait unsteadiness.   IMPRESSION/PLAN: Gait unsteadiness due primarily to right hip pain.  She has mild symptoms of neuropathy which can be contributed by diabetes and chemotherapy, however, her symptoms are very mild and exam does not show sensory ataxia.  Given that my suspicion that her imbalance is primarily stemming from her hip pain, I think it is reasonable to hold on NCS/EMG until after she has her right hip surgery.  I discussed the pathogenesis, etiology, management, and natural course of neuropathy. Neuropathy tends to be slowly progressive, and unfortunately, there is no treatment, management remains supportive. She may benefit from PT for balance, but again, I would suggest she wait until after surgery to do this.    Return to clinic in 6 months, or sooner as needed  ------------------------------------------------------------- History of present illness: She underwent chemotherapy with taxol and herceptin in 2021 and around the same time, she began having feet numbness/tingling and imbalance. She initially felt that symptoms would improve, but she feels that her balance has become worse.  She began using a cane 6 months ago.  She has has ~4 falls this year, all of which occurred while tripping over objects. She predominately has numbness involving the first  and second toes.  Symptoms are constant without exacerbating or alleviating factors.  She denies painful paresthesias.  She has right foot cramps and associated low back pain.  She also has left hip pain and was told she needs surgery. She is awaiting clearance for this.   Out-side paper records, electronic medical record, and images have been reviewed where available and summarized as:  Lab Results  Component Value Date   HGBA1C 6.4 (A) 02/12/2023   Lab Results  Component Value Date   VITAMINB12 1,082 (H) 05/01/2023   Lab Results  Component Value Date   TSH 0.80 03/21/2023   No results found for: "ESRSEDRATE", "POCTSEDRATE"  Past Medical History:  Diagnosis Date   Allergy AMOXICILLAN   Arthritis    Chest pain    Nuclear, adenosine,  December, 2013, low risk nuclear scan with small, moderate in intensity, fixed anteroseptal defect. This is possibly related to an LBBB versus small prior infarct. 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 w/appy   CHOLECYSTECTOMY     ESOPHAGOGASTRODUODENOSCOPY     HERNIA REPAIR      KNEE ARTHROSCOPY Right 11/2005   LIVER BIOPSY     PANCREATIC CYST EXCISION     PORT-A-CATH REMOVAL Left 04/25/2020   Procedure: REMOVAL PORT-A-CATH;  Surgeon: Abigail Miyamoto, MD;  Location: [facility];  Service: General;  Laterality: Left;   PORTACATH PLACEMENT Left 04/15/2019   Procedure: INSERTION PORT-A-CATH WITH ULTRASOUND;  Surgeon: Abigail Miyamoto, MD;  Location: [facility] OR;  Service: General;  Laterality: Left;   RIGHT/LEFT HEART CATH AND CORONARY ANGIOGRAPHY N/A 09/28/2019   Procedure: RIGHT/LEFT HEART CATH AND CORONARY ANGIOGRAPHY;  Surgeon: Laurey Morale, MD;  Location: [facility]ASIVE CV [facility];  Service: Cardiovascular;  Laterality: N/A;   SPLENECTOMY     TONSILLECTOMY AND ADENOIDECTOMY     TUBAL LIGATION  06/24/78     Medications:  Outpatient Encounter Medications as of 06/03/2023  Medication Sig   acetaminophen (TYLENOL) 500 MG tablet Take 1,000 mg by mouth every 6 (six) hours as needed for moderate pain.   anastrozole (ARIMIDEX) 1 MG tablet Take 1 tablet (1 mg total) by mouth daily.   aspirin 81 MG EC tablet Take 81 mg by mouth daily.   buPROPion (WELLBUTRIN XL) 300 MG 24 hr tablet Take 1 tablet (300 mg total) by mouth every morning.   carvedilol (COREG) 6.25 MG tablet Take 1 tablet (6.25 mg total) by mouth 2 (two) times daily with a meal.   Cholecalciferol (VITAMIN D) 50 MCG (2000 UT) tablet Take 2,000 Units by mouth daily.   Continuous Blood Gluc Receiver (FREESTYLE LIBRE 2 READER) DEVI 1 each by Does not apply route daily.   Continuous Glucose Sensor (FREESTYLE LIBRE 2 SENSOR) MISC APPLY AS DIRECTED EVERY 14 DAYS   EASY COMFORT PEN NEEDLES 32G X 4 MM MISC USE AS DIRECTED EVERY DAY   ENTRESTO 24-26 MG TAKE 1 TABLET BY MOUTH TWICE DAILY   fluticasone (FLONASE) 50 MCG/ACT nasal spray Place 2 sprays into both nostrils as needed for allergies or rhinitis.   furosemide (LASIX) 40 MG tablet Take 1 tablet (40 mg total) by mouth as needed. For weight gain of 3ln in 24  hours or 5lb in a week   JARDIANCE 25 MG TABS tablet TAKE 1 TABLET BY MOUTH DAILY   LANTUS SOLOSTAR 100 UNIT/ML Solostar Pen INJECT SUBCUTANEOUSLY 12-14  UNITS AT BEDTIME   meclizine (ANTIVERT) 25 MG tablet Take 25 mg by mouth 3 (three) times daily as needed for dizziness.   metFORMIN (GLUCOPHAGE) 1000 MG tablet Take 1 tablet (1,000 mg total) by mouth 2 (two) times daily with a meal.   omeprazole (PRILOSEC) 40 MG capsule Take 1 capsule (40 mg total) by mouth 2 (two) times daily. Please call [phone] to schedule an office visit for more refills   Probiotic Product (PROBIOTIC ADVANCED PO) Take 1 tablet by mouth at bedtime.    repaglinide (PRANDIN) 1 MG tablet Take 1 tablet (1 mg total) by mouth 3 (three) times daily before meals.   rosuvastatin (CRESTOR) 10 MG tablet Take 1 tablet (10 mg total) by mouth daily.   sertraline (ZOLOFT) 100 MG tablet Take 1 tablet (100  mg total) by mouth daily.   spironolactone (ALDACTONE) 25 MG tablet TAKE 1 TABLET BY MOUTH DAILY   TRULICITY 3 MG/0.5ML SOPN INJECT 3 MG (0.5 ML) UNDER THE SKIN ONCE A WEEK   Vitamin E 180 MG CAPS Take 180 mg by mouth at bedtime.    No facility-administered encounter medications on file as of 06/03/2023.    Allergies:  Allergies  Allergen Reactions   Invokana [Canagliflozin] Other (See Comments)    UTI's   Amoxicillin Other (See Comments)    Sore tongue Did it involve swelling of the face/tongue/throat, SOB, or low BP? No Did it involve sudden or severe rash/hives, skin peeling, or any reaction on the inside of your mouth or nose? No Did you need to seek medical attention at a hospital or doctor's office? No When did it last happen?      5-10 years ago If all above answers are "NO", may proceed with cephalosporin use.     Family History: Family History  Problem Relation Age of Onset   Liver disease Mother    Dementia Mother    Diabetes Mother        borderline   Depression Mother    Coronary artery disease Father     Heart attack Father    Hypertension Father    Heart disease Father    Early death Father    Cancer Other        leukemia   Stroke Maternal Grandfather    Alcohol abuse Maternal Grandfather    Hyperlipidemia Brother        Amyloidosis   Cancer Brother    Hypertension Brother    Diabetes Maternal Grandmother    Cancer Paternal Uncle        unknown   Heart attack Paternal Grandmother    Heart attack Paternal Uncle    Hypertension Brother    Diabetes Daughter        borderline   Diabetes Sister    Heart disease Sister    Kidney disease Sister    Heart disease Brother    Colon cancer Neg Hx    Esophageal cancer Neg Hx    Rectal cancer Neg Hx    Stomach cancer Neg Hx     Social History: Social History   Tobacco Use   Smoking status: Never   Smokeless tobacco: Never  Vaping Use   Vaping status: Never Used  Substance Use Topics   Alcohol use: No   Drug use: No   Social History   Social History Narrative   Regular Exercise -  NO   Are you right handed or left handed? Right Handed    Are you currently employed ? No    What is your current occupation? Retired   Do you live at home alone? No   Who lives with you? Husband, Daughter, and Lucila [place]   What type of home do you live in: 1 story or 2 story? Lives in a one story home        Vital Signs:  BP 114/77   Pulse 85   Ht 5\' 2"  (1.575 m)   Wt 130 lb (59 kg)   SpO2 97%   BMI 23.78 kg/m    Neurological Exam: MENTAL STATUS including orientation to time, place, person, recent and remote memory, attention span and concentration, language, and fund of knowledge is normal.  Speech is not dysarthric.  CRANIAL NERVES: II:  No visual field defects.     III-IV-VI: Pupils  equal round and reactive to light.  Normal conjugate, extra-ocular eye movements in all directions of gaze.  No nystagmus.  No ptosis.   V:  Normal facial sensation.    VII:  Normal facial symmetry and movements.   VIII:  Normal hearing and  vestibular function.   IX-X:  Normal palatal movement.   XI:  Normal shoulder shrug and head rotation.   XII:  Normal tongue strength and range of motion, no deviation or fasciculation.  MOTOR:  No atrophy, fasciculations or abnormal movements.  No pronator drift.   Upper Extremity:  Right  Left  Deltoid  5/5   5/5   Biceps  5/5   5/5   Triceps  5/5   5/5   Wrist extensors  5/5   5/5   Wrist flexors  5/5   5/5   Finger extensors  5/5   5/5   Finger flexors  5/5   5/5   Dorsal interossei  5/5   5/5   Abductor pollicis  5/5   5/5   Tone (Ashworth scale)  0  0   Lower Extremity:  Right  Left  Hip flexors  5/5   5/5   Knee flexors  5/5   5/5   Knee extensors  5/5   5/5   Dorsiflexors  5/5   5/5   Plantarflexors  5/5   5/5   Toe extensors  5/5   5/5   Toe flexors  5/5   5/5   Tone (Ashworth scale)  0  0   MSRs:                                           Right        Left brachioradialis 2+  2+  biceps 2+  2+  triceps 2+  2+  patellar 2+  2+  ankle jerk 1+  1+  Hoffman no  no  plantar response down  down   SENSORY:  Reduced temperature in the toes, otherwise Normal and symmetric perception of light touch, pinprick, and vibration.  Romberg's sign absent.   COORDINATION/GAIT: Normal finger-to- nose-finger.  Intact rapid alternating movements bilaterally.  Gait is wide-based, antalgic appearing to guard the hips when walking, unassisted.    Thank you for allowing me to participate in patient's care.  If I can answer any additional questions, I would be pleased to do so.    Sincerely,    Trenee Igoe K. Allena Katz, DO

## 2023-06-06 ENCOUNTER — Encounter: Payer: Self-pay | Admitting: Internal Medicine

## 2023-06-24 ENCOUNTER — Ambulatory Visit: Payer: PPO | Admitting: Internal Medicine

## 2023-06-24 ENCOUNTER — Telehealth: Payer: Self-pay

## 2023-06-24 ENCOUNTER — Encounter: Payer: Self-pay | Admitting: Internal Medicine

## 2023-06-24 VITALS — BP 118/64 | HR 86 | Ht 62.0 in | Wt 133.4 lb

## 2023-06-24 DIAGNOSIS — E1165 Type 2 diabetes mellitus with hyperglycemia: Secondary | ICD-10-CM

## 2023-06-24 DIAGNOSIS — E785 Hyperlipidemia, unspecified: Secondary | ICD-10-CM | POA: Diagnosis not present

## 2023-06-24 DIAGNOSIS — Z794 Long term (current) use of insulin: Secondary | ICD-10-CM

## 2023-06-24 DIAGNOSIS — E042 Nontoxic multinodular goiter: Secondary | ICD-10-CM

## 2023-06-24 LAB — POCT GLYCOSYLATED HEMOGLOBIN (HGB A1C): Hemoglobin A1C: 6.9 % — AB (ref 4.0–5.6)

## 2023-06-24 MED ORDER — REPAGLINIDE 1 MG PO TABS: 1.0000 mg | ORAL_TABLET | Freq: Every day | ORAL | Status: DC

## 2023-06-24 MED ORDER — TRULICITY 3 MG/0.5ML ~~LOC~~ SOAJ: 3.0000 mg | SUBCUTANEOUS | 3 refills | Status: DC

## 2023-06-24 NOTE — Progress Notes (Signed)
Patient ID: Margaret Norman, female   DOB: 10-26-1950, 72 y.o.   MRN: [id number]   HPI: Margaret Norman is a 72 y.o.-year-old female, returning for follow-up for DM2, dx in 2008, insulin-dependent since 2016, uncontrolled, without long term complications.  Last visit  4 months ago.  Interim history:  No increased urination, blurry vision, chest pain.   She  has dizziness and lightheadedness and a history of vertigo.  She is usually walking with a cane (but forgot it today).  Reviewed HbA1c levels: Lab Results  Component Value Date   HGBA1C 6.4 (A) 02/12/2023   HGBA1C 6.4 (A) 09/26/2022   HGBA1C 6.5 (A) 05/23/2022  03/19/2017: HbA1c calculated from fructosamine is 6.7%! 12/31/2016: HbA1c calculated from fructosamine: 6.9%!  Pt is on: - Metformin 1000 mg 2x a day with meals >> 2000 mg with dinner - Jardiance 25 mg daily before breakfast (PAP) - Prandin  1 mg before larger dinners -started 01/2023-however, she is actually taking it after breakfast - Trulicity 1.5 >> 3 mg weekly (PAP) - Lantus 30 >> 26 >> 22 >> 18 >> 12-14 units at night Stopped Actos when started trulicity back. She was on Trulicity but became expensive >> changed to Actos. She was on Invokana >> recurrent UTIs.  Pt checks her sugars >4x a day:  Previously:  Previously:  Lowest sugar was  58 >> 66 >> 63 >> 50s; she has hypoglycemia awareness in the 70s. Highest sugar was 200s >> 280 >> 250.  Glucometer: OneTouch Verio  -No CKD, last BUN/creatinine:  Lab Results  Component Value Date   BUN 18 04/26/2023   BUN 20 03/21/2023   CREATININE 1.07 (H) 04/26/2023   CREATININE 0.95 03/21/2023   Lab Results  Component Value Date   MICRALBCREAT 1.5 03/21/2023   MICRALBCREAT 1.1 08/23/2020   MICRALBCREAT 0.8 12/31/2016   MICRALBCREAT 1.1 12/29/2015   MICRALBCREAT 1.0 09/13/2014   MICRALBCREAT 4.7 11/15/2008   MICRALBCREAT 10.7 07/12/2008   MICRALBCREAT 8.8 01/16/2007   MICRALBCREAT 8.8 07/17/2006  On  Entresto.  -+ HL; last set of lipids: Lab Results  Component Value Date   CHOL 95 03/21/2023   HDL 45.00 03/21/2023   LDLCALC 28 03/21/2023   LDLDIRECT 137.9 02/04/2013   TRIG 110.0 03/21/2023   CHOLHDL 2 03/21/2023  On Crestor 10.  - last eye exam was 12/20/2022: No DR, + cataract OS.  Dr. Blima Ledger. Latest eye exam: [facility].  -+ numbness and tingling in her toes - after chemotx.  She has restless leg syndrome.  Last foot exam 09/26/2022.  Pt has FH of DM in MGM.  Multinodular goiter:   -Detected on palpation in 2021  Thyroid ultrasound (01/26/2020): Parenchymal Echotexture: Moderately heterogenous Isthmus: 0.3 cm Right lobe: 6.2 cm x 2.1 cm x 2.2 cm Left lobe: 7.7 cm x 3.2 cm x 2.8 cm _________________________________________________________   Nodule # 1: Location: Right; Superior Maximum size: 0.9 cm; Other 2 dimensions: 0.7 cm x 0.7 cm Composition: spongiform (0) Spongiform nodule does not meet criteria for surveillance or biopsy  _________________________________________________________   Nodule # 2: Location: Right; Mid Maximum size: 1.0 cm; Other 2 dimensions: 0.9 cm x 0.6 cm Composition: spongiform (0) Spongiform nodule does not meet criteria for surveillance or biopsy  _________________________________________________________   Nodule # 3:  Location: Right; Mid Maximum size: 1.2 cm; Other 2 dimensions: 1.1 cm x 0.7 cm Composition: spongiform (0) Spongiform nodule does not meet criteria for surveillance or biopsy _________________________________________________________   Nodule # 4:  Location: Right; Inferior Maximum size: 1.0 cm; Other 2 dimensions: 0.8 cm x 0.8 cm Composition: spongiform (0) Spongiform nodule does not meet criteria for surveillance or biopsy  ________________________________________________________   Nodule # 5:  Location: Right; Inferior Maximum size: 1.5 cm; Other 2 dimensions: 1.4 cm x 0.8 cm Composition: solid/almost  completely solid (2) Echogenicity: isoechoic (1) Nodule meets criteria for surveillance  _________________________________________________________   Nodule # 6: Location: Left; Superior Maximum size: 1.2 cm; Other 2 dimensions: 1.1 cm x 0.9 cm  Composition: cannot determine (2) Echogenicity: isoechoic (1) Echogenic foci: macrocalcifications (1)  Nodule meets criteria for surveillance  _______________________________________________________   Nodule # 7: Location: Left; Inferior Maximum size: 4.7 cm; Other 2 dimensions: 4.7 cm x 2.8 cm Composition: mixed cystic and solid (1) Echogenicity: isoechoic (1) Echogenic foci: macrocalcifications (1) Nodule meets criteria for biopsy  ______________________________________________________   No adenopathy   IMPRESSION: Multinodular thyroid.   Left inferior thyroid nodule (labeled 7, TR , 4.7 cm) meets criteria for biopsy, as designated by the newly established ACR TI-RADS criteria, and referral for biopsy is recommended.   Right inferior thyroid nodule (labeled 5, 1.5 cm, TR 3) and the left superior thyroid nodule (labeled 6, 1.2 cm, TR 4) both meet criteria for surveillance, as designated by the newly established ACR TI-RADS criteria. Surveillance ultrasound study recommended to be performed annually up to 5 years.   FNA left inferior thyroid nodule (02/16/2020): Benign  Thyroid U/S (02/07/2021): Parenchymal Echotexture: Markedly heterogenous Isthmus: 0.6 cm Right lobe: 7.4 cm x 2.0 cm x 2.5 cm Left lobe: 8.4 cm x 3.2 cm x 4.3 cm _________________________________________________________   Estimated total number of nodules >/= 1 cm: 6-10  _________________________________________________________   Nodule labeled 1 superior right thyroid, slightly smaller than previous and spongiform. Nodule does not meet criteria for surveillance or biopsy   Nodule labeled 2, right thyroid, TR 2 with cystic components measures less than 1  cm. Nodule does not meet criteria for surveillance or biopsy   Nodule labeled 3, mid right thyroid. Spongiform characteristics measuring 1.3 cm. Nodule does not meet criteria for surveillance or biopsy.   Nodule labeled 4, mid right thyroid, 9 mm, spongiform characteristics. Nodule does not meet criteria for surveillance or biopsy.   Nodule labeled 5, 0.98 cm, spongiform characteristics and does not meet criteria for surveillance or biopsy.   Nodule 6, right lower thyroid, 0.96 cm. Nodule has spongiform characteristics and does not meet criteria for surveillance or biopsy.   Nodule 7, right lower thyroid, smaller than previous, now 1.0 cm. Previous nodule was labeled 5, 1.5 cm. Nodule has clearly spongiform characteristics on the current ultrasound, and has decreased in size below 1 cm. Nodule no longer meets criteria for surveillance.   Nodule labeled 8 on the left, upper left thyroid, 9 mm. Nodule has internal cystic change on the current, TR 2 characteristics, and has decreased in size below 1 cm. Nodule no longer meets criteria for continued surveillance.   The nodules at the left inferior thyroid labeled 9, 10, 11, appear to all be part of the previous 4.7 cm nodule which was previously biopsied 02/16/2020, and are doubtful to represent independent nodules given the available images and the comparison.   No adenopathy   Recommendations follow those established by the new ACR TI-RADS criteria (J Am Coll Radiol 2017;14:587-595).   IMPRESSION: Enlarged multinodular thyroid, as above.   Nodule inferior left thyroid has been previously biopsied, as above. Assuming benign result, no further specific follow-up would  be indicated.  Pt denies: - feeling nodules in neck - hoarseness - dysphagia - choking  Latest TSH was normal Lab Results  Component Value Date   TSH 0.80 03/21/2023   She sees Cardiology - Dr. Shirlee Latch >> on [place]varado, Coreg. She also has a history of  breast cancer, s/p chemoradiation therapy. She fractured humerus in 02/2020 after tripping >> this is healing.  She had a DXA scan >> normal.  She is on Arimidex.  She had lightheadedness and dizziness at last visit.  She was in the emergency room with vertigo 06/30/2021.  ROS: + See HPI  I reviewed pt's medications, allergies, PMH, social hx, family hx, and changes were documented in the history of present illness. Otherwise, unchanged from my initial visit note.  Past Medical History:  Diagnosis Date   Allergy AMOXICILLAN   Arthritis    Chest pain    Nuclear, adenosine,  December, 2013, low risk nuclear scan with small, moderate in intensity, fixed anteroseptal defect. This is possibly related to an LBBB versus small prior infarct. 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 w/appy   CHOLECYSTECTOMY     ESOPHAGOGASTRODUODENOSCOPY     HERNIA REPAIR     KNEE ARTHROSCOPY Right 11/2005   LIVER BIOPSY     PANCREATIC CYST EXCISION     PORT-A-CATH REMOVAL Left 04/25/2020   Procedure: REMOVAL PORT-A-CATH;  Surgeon: Abigail Miyamoto, MD;  Location: [facility];  Service: General;  Laterality: Left;   PORTACATH PLACEMENT Left 04/15/2019   Procedure: INSERTION PORT-A-CATH WITH ULTRASOUND;  Surgeon: Abigail Miyamoto, MD;  Location: [facility] OR;  Service: General;  Laterality: Left;   RIGHT/LEFT HEART CATH AND CORONARY ANGIOGRAPHY N/A 09/28/2019   Procedure: RIGHT/LEFT HEART CATH AND CORONARY ANGIOGRAPHY;  Surgeon: Laurey Morale, MD;  Location: [facility];  Service: Cardiovascular;  Laterality: N/A;   SPLENECTOMY     TONSILLECTOMY AND ADENOIDECTOMY     TUBAL LIGATION  06/24/78   Social History   Socioeconomic History   Marital status: Married    Spouse name: Thuy Routt   Number of children: 2   Years of education: Not on file   Highest education level: 12th grade  Occupational History   Occupation: CSR    Employer: [employer]   Occupation: retired  Tobacco Use   Smoking status: Never   Smokeless tobacco: Never  Vaping Use   Vaping status: Never Used  Substance and Sexual Activity   Alcohol use: No   Drug use: No  Sexual activity: Not Currently    Birth control/protection: Pill    Comment: Tubes tied  Other Topics Concern   Not on file  Social History Narrative   Regular Exercise -  NO   Are you right handed or left handed? Right Handed    Are you currently employed ? No    What is your current occupation? Retired   Do you live at home alone? No   Who lives with you? Husband, Daughter, and Lucila Maine   What type of home do you live in: 1 story or 2 story? Lives in a one story home       Social Drivers of Health   Financial Resource Strain: Low Risk  (04/26/2023)   Overall Financial Resource Strain (CARDIA)    Difficulty of Paying Living Expenses: Not very hard  Food Insecurity: No Food Insecurity (04/26/2023)   Hunger Vital Sign    Worried About Running Out of Food in the Last Year: Never true    Ran Out of Food in the Last Year: Never true  Recent Concern: Food  Insecurity - Food Insecurity Present (03/14/2023)   Hunger Vital Sign    Worried About Running Out of Food in the Last Year: Never true    Ran Out of Food in the Last Year: Sometimes true  Transportation Needs: No Transportation Needs (04/26/2023)   PRAPARE - Administrator, Civil Service (Medical): No    Lack of Transportation (Non-Medical): No  Physical Activity: Inactive (04/26/2023)   Exercise Vital Sign    Days of Exercise per Week: 0 days    Minutes of Exercise per Session: 10 min  Stress: Stress Concern Present (04/26/2023)   [employer] of Occupational Health - Occupational Stress Questionnaire    Feeling of Stress : To some extent  Social Connections: Moderately Isolated (04/26/2023)   Social Connection and Isolation Panel [NHANES]    Frequency of Communication with Friends and Family: More than three times a week    Frequency of Social Gatherings with Friends and Family: Three times a week    Attends Religious Services: Never    Active Member of Clubs or Organizations: No    Attends Banker Meetings: Never    Marital Status: Married  Catering manager Violence: Not At Risk (12/18/2022)   Humiliation, Afraid, Rape, and Kick questionnaire    Fear of Current or Ex-Partner: No    Emotionally Abused: No    Physically Abused: No    Sexually Abused: No   Current Outpatient Medications on File Prior to Visit  Medication Sig Dispense Refill   acetaminophen (TYLENOL) 500 MG tablet Take 1,000 mg by mouth every 6 (six) hours as needed for moderate pain.     anastrozole (ARIMIDEX) 1 MG tablet Take 1 tablet (1 mg total) by mouth daily. 90 tablet 3   aspirin 81 MG EC tablet Take 81 mg by mouth daily.     buPROPion (WELLBUTRIN XL) 300 MG 24 hr tablet Take 1 tablet (300 mg total) by mouth every morning. 90 tablet 1   carvedilol (COREG) 6.25 MG tablet Take 1 tablet (6.25 mg total) by mouth 2 (two) times daily with a meal. 180 tablet 3   Cholecalciferol (VITAMIN D)  50 MCG (2000 UT) tablet Take 2,000 Units by mouth daily.     Continuous Blood Gluc Receiver (FREESTYLE LIBRE 2 READER) DEVI 1 each by Does not apply route daily. 6 each 3   Continuous Glucose Sensor (FREESTYLE LIBRE 2  SENSOR) MISC APPLY AS DIRECTED EVERY 14 DAYS 6 each 3   EASY COMFORT PEN NEEDLES 32G X 4 MM MISC USE AS DIRECTED EVERY DAY 100 each 3   ENTRESTO 24-26 MG TAKE 1 TABLET BY MOUTH TWICE DAILY 60 tablet 11   fluticasone (FLONASE) 50 MCG/ACT nasal spray Place 2 sprays into both nostrils as needed for allergies or rhinitis.     furosemide (LASIX) 40 MG tablet Take 1 tablet (40 mg total) by mouth as needed. For weight gain of 3ln in 24 hours or 5lb in a week     JARDIANCE 25 MG TABS tablet TAKE 1 TABLET BY MOUTH DAILY 90 tablet 3   LANTUS SOLOSTAR 100 UNIT/ML Solostar Pen INJECT SUBCUTANEOUSLY 12-14  UNITS AT BEDTIME     meclizine (ANTIVERT) 25 MG tablet Take 25 mg by mouth 3 (three) times daily as needed for dizziness.     metFORMIN (GLUCOPHAGE) 1000 MG tablet Take 1 tablet (1,000 mg total) by mouth 2 (two) times daily with a meal. 180 tablet 3   omeprazole (PRILOSEC) 40 MG capsule Take 1 capsule (40 mg total) by mouth 2 (two) times daily. Please call [phone] to schedule an office visit for more refills 60 capsule 3   Probiotic Product (PROBIOTIC ADVANCED PO) Take 1 tablet by mouth at bedtime.      repaglinide (PRANDIN) 1 MG tablet Take 1 tablet (1 mg total) by mouth 3 (three) times daily before meals. 90 tablet 3   rosuvastatin (CRESTOR) 10 MG tablet Take 1 tablet (10 mg total) by mouth daily. 30 tablet 11   sertraline (ZOLOFT) 100 MG tablet Take 1 tablet (100 mg total) by mouth daily. 90 tablet 3   spironolactone (ALDACTONE) 25 MG tablet TAKE 1 TABLET BY MOUTH DAILY 90 tablet 3   TRULICITY 3 MG/0.5ML SOPN INJECT 3 MG (0.5 ML) UNDER THE SKIN ONCE A WEEK 8 mL 0   Vitamin E 180 MG CAPS Take 180 mg by mouth at bedtime.      No current facility-administered medications on file prior to  visit.   Allergies  Allergen Reactions   Invokana [Canagliflozin] Other (See Comments)    UTI's   Amoxicillin Other (See Comments)    Sore tongue Did it involve swelling of the face/tongue/throat, SOB, or low BP? No Did it involve sudden or severe rash/hives, skin peeling, or any reaction on the inside of your mouth or nose? No Did you need to seek medical attention at a hospital or doctor's office? No When did it last happen?      5-10 years ago If all above answers are "NO", may proceed with cephalosporin use.    Family History  Problem Relation Age of Onset   Liver disease Mother    Dementia Mother    Diabetes Mother        borderline   Depression Mother    Coronary artery disease Father    Heart attack Father    Hypertension Father    Heart disease Father    Early death Father    Cancer Other        leukemia   Stroke Maternal Grandfather    Alcohol abuse Maternal Grandfather    Hyperlipidemia Brother        Amyloidosis   Cancer Brother    Hypertension Brother    Diabetes Maternal Grandmother    Cancer Paternal Uncle        unknown   Heart attack Paternal Grandmother    Heart  attack Paternal Uncle    Hypertension Brother    Diabetes Daughter        borderline   Diabetes Sister    Heart disease Sister    Kidney disease Sister    Heart disease Brother    Colon cancer Neg Hx    Esophageal cancer Neg Hx    Rectal cancer Neg Hx    Stomach cancer Neg Hx    PE: BP 118/64   Pulse 86   Ht 5\' 2"  (1.575 m)   Wt 133 lb 6.4 oz (60.5 kg)   SpO2 97%   BMI 24.40 kg/m   Wt Readings from Last 3 Encounters:  06/24/23 133 lb 6.4 oz (60.5 kg)  06/03/23 130 lb (59 kg)  05/16/23 129 lb (58.5 kg)   Constitutional: Normal weight, in NAD Eyes: EOMI, no exophthalmos ENT:  + full thyroid on palpation (L>R lobe), no cervical lymphadenopathy Cardiovascular: RRR, No MRG Respiratory: CTA B Musculoskeletal: no deformities Skin: no rashes Neurological: + tremor with  outstretched hands  ASSESSMENT: 1. DM2, insulin-dependent, uncontrolled, without long term complications, but with hyperglycemia  2. HL  3.  Multiple thyroid nodules  PLAN:  1. Patient with longstanding, previously uncontrolled type 2 diabetes, with improved control more recently and an HbA1c of 6.4% at last visit (stable).  She is on metformin, SGLT2 inhibitor, as needed meglitinide, weekly GLP-1 receptor agonist and daily long-acting insulin.  At last visit sugars were fluctuating within the target range but increasing yesterday went back with the highest sugars after dinner and an increase in blood sugars overnight with occasional sugars in the 60s in the second half of the night.  At that time I suggested Prandin before larger dinners and we reduced her Lantus dose to avoid hypoglycemia overnight.  We did not change the rest of the regimen.  Reviewed CGM interpretation: -At today's visit, we reviewed her CGM downloads: It appears that 72% of values are in target range (goal >70%), while 26% are higher than 180 (goal <25%), and 2% are lower than 70 (goal <4%).  The calculated average blood sugar is 152.  The projected HbA1c for the next 3 months (GMI) is approximately 6.9%. -Reviewing the CGM trends, sugars appear to be increasing slightly after breakfast, but then more significant after dinner.  They are dropping fairly abruptly after dinner and may have some low blood sugars overnight, 170s.  Sugars started to increase after approximately 7 AM.  Upon questioning, patient did start Prandin after last visit but instead of taking it before dinner, she is taking it after breakfast.  At today's visit I advised her to move the Prandin to approximately 15 to 20 minutes before a larger dinner.  Due to the drop in blood sugars overnight I advised her to reduce the Lantus dose even more.  Otherwise, we will continue the rest of the regimen. - I suggested to:  Patient Instructions  Please continue: -  Metformin 2000 mg with dinner - Jardiance 25 mg daily before breakfast - Trulicity 3 mg weekly  Decrease: - Lantus 10-12 units at night   Start taking: - Repaglinide (Prandin) 1 mg before a larger dinner   Please return in 4 months.   - we checked her HbA1c: 6.9% (higher) - advised to check sugars at different times of the day - 4x a day, rotating check times - advised for yearly eye exams >> she is UTD - return to clinic in 4 months  2. HL -Reviewed latest lipid  panel from 02/2023: All fractions at goal: Lab Results  Component Value Date   CHOL 95 03/21/2023   HDL 45.00 03/21/2023   LDLCALC 28 03/21/2023   LDLDIRECT 137.9 02/04/2013   TRIG 110.0 03/21/2023   CHOLHDL 2 03/21/2023  -She continues Crestor 10 mg daily without side effects  3.  Thyroid nodules -Her left thyroid lobe was enlarged on exam so we checked a thyroid ultrasound in 01/2020.  She had several nodules, of which the left inferior nodule had an indication for biopsy.  A biopsy of this nodule was benign.  For the other 2 nodules a follow-up was indicated in 1 year.  He had another ultrasound in 01/2021 the nodule appeared to be either stable or decreased in size.  No imaging follow-up is indicated.  -No masses felt on palpation of her neck today and also no neck compression symptoms at today's visit. -Latest TSH was normal: Lab Results  Component Value Date   TSH 0.80 03/21/2023  -We will continue to follow her clinically for now  Carlus Pavlov, MD PhD [facility] Endocrinology

## 2023-06-24 NOTE — Patient Instructions (Addendum)
Please continue: - Metformin 2000 mg with dinner - Jardiance 25 mg daily before breakfast - Trulicity 3 mg weekly  Decrease: - Lantus 10-12 units at night   Start taking: - Repaglinide (Prandin) 1 mg before a larger dinner   Please return in 4 months.

## 2023-06-24 NOTE — Addendum Note (Signed)
Addended by: Pollie Meyer on: 06/24/2023 10:10 AM   Modules accepted: Orders

## 2023-06-24 NOTE — Telephone Encounter (Signed)
Trulicity rx for [employer].   Requested Prescriptions   Signed Prescriptions Disp Refills   Dulaglutide (TRULICITY) 3 MG/0.5ML SOAJ 6 mL 3    Sig: Inject 3 mg into the skin once a week.    Authorizing Provider: Carlus Pavlov    Ordering User: Pollie Meyer

## 2023-06-30 ENCOUNTER — Encounter: Payer: Self-pay | Admitting: Internal Medicine

## 2023-07-16 ENCOUNTER — Encounter: Payer: Self-pay | Admitting: Orthopaedic Surgery

## 2023-07-16 ENCOUNTER — Other Ambulatory Visit: Payer: Self-pay | Admitting: Obstetrics and Gynecology

## 2023-07-16 DIAGNOSIS — Z1231 Encounter for screening mammogram for malignant neoplasm of breast: Secondary | ICD-10-CM

## 2023-07-22 ENCOUNTER — Telehealth: Payer: Self-pay | Admitting: Neurology

## 2023-07-22 NOTE — Telephone Encounter (Signed)
Copied from CRM [id number]. Topic: Clinical - Medical Advice >> Jul 22, 2023  3:14 PM Tiffany H wrote: Reason for CRM: Patient's daughter called to advise that she has some concerns about the potential of patient surviving the hip replacement she has scheduled in February.   She isn't eating or drinking much, she has constant fatigue. Patient has been losing a lot of weight and detaching since going into remission. She's still falling. Patient has consulted with cardiology about this. She would like Vernona Rieger to do a full panel with labs.   Please reach out to patient's daughter. Patient's daughter advised she isn't telling Vernona Rieger all of her ongoing issues.   Patient's daughter advised that they need help getting patient to a point, healthwise, to have the hip surgery.   Please call Gunnar Fusi directly at [phone].

## 2023-07-23 ENCOUNTER — Encounter: Payer: Self-pay | Admitting: Family

## 2023-07-24 NOTE — Telephone Encounter (Signed)
Spoke with pts daughter Margaret Norman, pt states she will call back at a later time to schedule follow up appointment as she would like to make sure she is able to attend appointment as well. Margaret Norman also states she "does not agree with hip replacement surgery at all" Margaret Norman states pt is very weak, has loss of appetite which is causing pt to lose weight, pt also has not been "staying hydrated". Margaret Norman is also concerned with pts WBC and would like to discuss labs at follow up as well as going over all of pts medications as she believes "there is no reason pt should be on all those medications". Margaret Norman also stated she took her mother out for lunch and discussed with her that she is not in good shape for the surgery. Pt called this morning to cancel her hip replacement surgery. Margaret Norman would like to have home care and in home PT. Advised pt a message would be sent back to PCP.

## 2023-07-29 ENCOUNTER — Encounter: Payer: Self-pay | Admitting: Orthopaedic Surgery

## 2023-07-29 NOTE — Telephone Encounter (Signed)
 Ok thank you

## 2023-07-29 NOTE — Telephone Encounter (Signed)
Patient has cancelled her left THA with Dr. Roda Shutters for 08-19-23 at [facility].  She states it is not a good time for surgery right now.

## 2023-07-30 ENCOUNTER — Ambulatory Visit
Admission: RE | Admit: 2023-07-30 | Discharge: 2023-07-30 | Disposition: A | Discharge status: Home / Self Care | Payer: PPO | Source: Ambulatory Visit | Attending: Obstetrics and Gynecology | Admitting: Obstetrics and Gynecology

## 2023-07-30 DIAGNOSIS — Z1231 Encounter for screening mammogram for malignant neoplasm of breast: Secondary | ICD-10-CM | POA: Diagnosis not present

## 2023-08-05 ENCOUNTER — Other Ambulatory Visit: Payer: Self-pay | Admitting: Obstetrics and Gynecology

## 2023-08-05 DIAGNOSIS — R928 Other abnormal and inconclusive findings on diagnostic imaging of breast: Secondary | ICD-10-CM

## 2023-08-13 ENCOUNTER — Other Ambulatory Visit: Payer: Self-pay | Admitting: Family

## 2023-08-19 ENCOUNTER — Other Ambulatory Visit: Payer: Self-pay | Admitting: Obstetrics and Gynecology

## 2023-08-19 ENCOUNTER — Ambulatory Visit (HOSPITAL_COMMUNITY): Admit: 2023-08-19 | Payer: PPO | Admitting: Orthopaedic Surgery

## 2023-08-19 ENCOUNTER — Ambulatory Visit
Admission: RE | Admit: 2023-08-19 | Discharge: 2023-08-19 | Disposition: A | Discharge status: Home / Self Care | Payer: PPO | Source: Ambulatory Visit | Attending: Obstetrics and Gynecology | Admitting: Obstetrics and Gynecology

## 2023-08-19 ENCOUNTER — Ambulatory Visit: Payer: PPO

## 2023-08-19 DIAGNOSIS — N6310 Unspecified lump in the right breast, unspecified quadrant: Secondary | ICD-10-CM

## 2023-08-19 DIAGNOSIS — R921 Mammographic calcification found on diagnostic imaging of breast: Secondary | ICD-10-CM | POA: Diagnosis not present

## 2023-08-19 DIAGNOSIS — R928 Other abnormal and inconclusive findings on diagnostic imaging of breast: Secondary | ICD-10-CM

## 2023-08-19 SURGERY — ARTHROPLASTY, HIP, TOTAL, ANTERIOR APPROACH
Anesthesia: Spinal | Site: Hip | Laterality: Left

## 2023-08-26 ENCOUNTER — Ambulatory Visit
Admission: RE | Admit: 2023-08-26 | Discharge: 2023-08-26 | Disposition: A | Discharge status: Home / Self Care | Payer: PPO | Source: Ambulatory Visit | Attending: Obstetrics and Gynecology | Admitting: Obstetrics and Gynecology

## 2023-08-26 ENCOUNTER — Ambulatory Visit
Admission: RE | Admit: 2023-08-26 | Discharge: 2023-08-26 | Disposition: A | Discharge status: Home / Self Care | Source: Ambulatory Visit | Attending: Obstetrics and Gynecology | Admitting: Obstetrics and Gynecology

## 2023-08-26 DIAGNOSIS — R921 Mammographic calcification found on diagnostic imaging of breast: Secondary | ICD-10-CM

## 2023-08-26 DIAGNOSIS — N6311 Unspecified lump in the right breast, upper outer quadrant: Secondary | ICD-10-CM | POA: Diagnosis not present

## 2023-08-26 DIAGNOSIS — N6310 Unspecified lump in the right breast, unspecified quadrant: Secondary | ICD-10-CM

## 2023-08-26 DIAGNOSIS — R92321 Mammographic fibroglandular density, right breast: Secondary | ICD-10-CM | POA: Diagnosis not present

## 2023-08-26 DIAGNOSIS — N6031 Fibrosclerosis of right breast: Secondary | ICD-10-CM | POA: Diagnosis not present

## 2023-08-26 HISTORY — PX: BREAST BIOPSY: SHX20

## 2023-08-27 LAB — SURGICAL PATHOLOGY

## 2023-08-30 ENCOUNTER — Other Ambulatory Visit: Payer: Self-pay | Admitting: Physician Assistant

## 2023-09-10 ENCOUNTER — Other Ambulatory Visit: Payer: Self-pay | Admitting: Family

## 2023-09-10 ENCOUNTER — Encounter: Payer: Self-pay | Admitting: Family

## 2023-09-11 DIAGNOSIS — E119 Type 2 diabetes mellitus without complications: Secondary | ICD-10-CM | POA: Diagnosis not present

## 2023-09-11 LAB — HM DIABETES EYE EXAM

## 2023-09-12 ENCOUNTER — Other Ambulatory Visit (HOSPITAL_COMMUNITY): Payer: Self-pay | Admitting: Cardiology

## 2023-09-12 ENCOUNTER — Telehealth (HOSPITAL_COMMUNITY): Payer: Self-pay

## 2023-09-12 NOTE — Telephone Encounter (Signed)
 Called to confirm/remind patient of their appointment [facility] on 09/13/[facility].   Appointment:   [x] Confirmed  [] Left mess   [] No answer/No voice mail  [] Phone not in service  Patient reminded to bring all medications and/or complete list.  Confirmed patient has transportation. Gave directions, instructed to utilize valet parking.

## 2023-09-13 ENCOUNTER — Encounter (HOSPITAL_COMMUNITY): Payer: Self-pay

## 2023-09-13 ENCOUNTER — Ambulatory Visit (HOSPITAL_COMMUNITY)
Admission: RE | Admit: 2023-09-13 | Discharge: 2023-09-13 | Disposition: A | Discharge status: Home / Self Care | Payer: PPO | Source: Ambulatory Visit | Attending: Family Medicine | Admitting: Family Medicine

## 2023-09-13 VITALS — BP 120/74 | HR 84 | Wt 131.6 lb

## 2023-09-13 DIAGNOSIS — I447 Left bundle-branch block, unspecified: Secondary | ICD-10-CM | POA: Diagnosis not present

## 2023-09-13 DIAGNOSIS — R0683 Snoring: Secondary | ICD-10-CM | POA: Insufficient documentation

## 2023-09-13 DIAGNOSIS — R2689 Other abnormalities of gait and mobility: Secondary | ICD-10-CM | POA: Insufficient documentation

## 2023-09-13 DIAGNOSIS — Z853 Personal history of malignant neoplasm of breast: Secondary | ICD-10-CM | POA: Insufficient documentation

## 2023-09-13 DIAGNOSIS — R5383 Other fatigue: Secondary | ICD-10-CM | POA: Insufficient documentation

## 2023-09-13 DIAGNOSIS — I5022 Chronic systolic (congestive) heart failure: Secondary | ICD-10-CM | POA: Diagnosis not present

## 2023-09-13 DIAGNOSIS — C50919 Malignant neoplasm of unspecified site of unspecified female breast: Secondary | ICD-10-CM

## 2023-09-13 DIAGNOSIS — Z923 Personal history of irradiation: Secondary | ICD-10-CM | POA: Diagnosis not present

## 2023-09-13 DIAGNOSIS — I442 Atrioventricular block, complete: Secondary | ICD-10-CM | POA: Diagnosis not present

## 2023-09-13 DIAGNOSIS — Z9221 Personal history of antineoplastic chemotherapy: Secondary | ICD-10-CM | POA: Diagnosis not present

## 2023-09-13 DIAGNOSIS — I428 Other cardiomyopathies: Secondary | ICD-10-CM | POA: Diagnosis not present

## 2023-09-13 DIAGNOSIS — M1612 Unilateral primary osteoarthritis, left hip: Secondary | ICD-10-CM | POA: Diagnosis not present

## 2023-09-13 DIAGNOSIS — E119 Type 2 diabetes mellitus without complications: Secondary | ICD-10-CM | POA: Insufficient documentation

## 2023-09-13 LAB — BASIC METABOLIC PANEL
Anion gap: 13 (ref 5–15)
BUN: 10 mg/dL (ref 8–23)
CO2: 25 mmol/L (ref 22–32)
Calcium: 10 mg/dL (ref 8.9–10.3)
Chloride: 107 mmol/L (ref 98–111)
Creatinine, Ser: 0.84 mg/dL (ref 0.44–1.00)
GFR, Estimated: >60 mL/min (ref >60)
Glucose, Bld: 129 mg/dL — ABNORMAL HIGH (ref 70–99)
Potassium: 4.9 mmol/L (ref 3.5–5.1)
Sodium: 145 mmol/L (ref 135–145)

## 2023-09-13 NOTE — Patient Instructions (Signed)
 There has been no changes to your medications.  Labs done today, your results will be available in MyChart, we will contact you for abnormal readings.  Your provider has recommended that you have a home sleep study ([facility]amar Test).  We have provided you with the equipment in our office today. Please go ahead and download the app. DO NOT OPEN OR TAMPER WITH THE BOX UNTIL WE ADVISE YOU TO DO SO. Once insurance has approved the test our office will call you with PIN number and approval to proceed with testing. Once you have completed the test you just dispose of the equipment, the information is automatically uploaded to [place] via blue-tooth technology. If your test is positive for sleep apnea and you need a home CPAP machine you will be contacted by Dr Norris Cross office [facility]) to set this up.   Your physician has requested that you have an echocardiogram. Echocardiography is a painless test that uses sound waves to create images of your heart. It provides your doctor with information about the size and shape of your heart and how well your heart's chambers and valves are working. This procedure takes approximately one hour. There are no restrictions for this procedure. Please do NOT wear cologne, perfume, aftershave, or lotions (deodorant is allowed). Please arrive 15 minutes prior to your appointment time.  Please note: We ask at that you not bring children with you during ultrasound (echo/ vascular) testing. Due to room size and safety concerns, children are not allowed in the ultrasound rooms during exams. Our front office staff cannot provide observation of children in our lobby area while testing is being conducted. An adult accompanying a patient to their appointment will only be allowed in the ultrasound room at the discretion of the ultrasound technician under special circumstances. We apologize for any inconvenience.  Your physician recommends that you schedule a follow-up appointment in: 4  months with an echocardiogram ( July ** PLEASE CALL THE OFFICE IN MAY TO ARRANGE YOUR FOLLOW UP APPOINTMENT.**  If you have any questions or concerns before your next appointment please send [place] a message through [place] or call our office at [phone].    TO LEAVE A MESSAGE FOR THE NURSE SELECT OPTION 2, PLEASE LEAVE A MESSAGE INCLUDING: YOUR NAME DATE OF BIRTH CALL BACK NUMBER REASON FOR CALL**this is important as we prioritize the call backs  YOU WILL RECEIVE A CALL BACK THE SAME DAY AS LONG AS YOU CALL BEFORE 4:00 PM  At the [facility], you and your health needs are our priority. As part of our continuing mission to provide you with exceptional heart care, we have created designated Provider Care Teams. These Care Teams include your primary Cardiologist (physician) and Advanced Practice Providers (APPs- Physician Assistants and Nurse Practitioners) who all work together to provide you with the care you need, when you need it.   You may see any of the following providers on your designated Care Team at your next follow up: Dr Arvilla Meres Dr Marca Ancona Dr. Dorthula Nettles Dr. Clearnce Hasten Amy Filbert Schilder, NP Robbie Lis, [place] [facility] [place], [place] Brynda Peon, NP [place] Lee, NP Clarisa Kindred, NP Karle Plumber, PharmD Enos Fling, PharmD   Please be sure to bring in all your medications bottles to every appointment.    Thank you for choosing [facility]

## 2023-09-13 NOTE — Progress Notes (Signed)
 PCP: Olive Bass, FNP Oncology: Dr. Pamelia Hoit HF cardiology: Dr. Shirlee Latch   73 y.o. with history of DM, chronic LBBB, and breast cancer was referred by Dr. Pamelia Hoit for workup of fall in EF.  She was diagnosed with right breast cancer, ER+/PR+/HER2+ in 9/20.  She had right lumpectomy in 10/20.  Starting in 11/20, she had been getting 12 cycles of Taxol/Herceptin to be followed by Herceptin alone to complete a year. Echo in 10/20 showed EF 55-60% per report (looked more like 50% on my review, likely due to septal-lateral dyssynchrony).  Her repeat echo for Herceptin screening was done in 1/21, showing EF down to 30-35% with septal-lateral dyssynchrony.  Herceptin has been stopped for now.  She is currently undergoing radiation therapy.   Echo in 3/21 showed that EF remains 30-35% with prominent septal-lateral dyssynchrony.  Patient has a strong family history of heart problems.  Father died from "ruptured aorta."  Sister had heart valve repair, one brother has a pacemaker, and another brother has AL amyloidosis.   LHC/RHC was done in 4/21. This showed normal filling pressures, preserved cardiac output, and no significant CAD. Cardiac MRI in 5/21 showed EF 36% with septal-lateral dyssynchrony, RV EF normal 45%, no LGE.  Echo in 10/21 showed EF 40-45% with septal-lateral dyssynchrony.   Echo in 4/22 showed EF 45% with septal-lateral dyssynchrony, normal RV.  Echo in 4/23 showed EF 45% with septal-lateral dyssynchrony, normal RV, IVC normal.   Echo in 5/24 showed EF 45-50%, septal-lateral dyssynchrony, RV normal, IVC normal.   Today she returns for HF follow up. Overall feeling fine. She has poor balance and walks with a walker. Planning hip replacement at some point, date TBD. Likes to work in her yard, pulled weeds yesterday. No SOB but limited mostly by fatigue. Remains dizzy. Denies palpitations, abnormal bleeding, CP, edema, or PND/Orthopnea. Appetite ok. No fever or chills. Weight at home 126  pounds. Taking all medications. Has not followed thru with PREP program.  Labs (9/24): LDL 28, TSH normal Labs (11/24): K 3.9, creatinine 1.07, hgb 12.2  ECG (personally reviewed): NSR 1AVB PR 210 msec  PMH: 1. Depression 2. Type 2 diabetes 3. Chronic LBBB 4. H/o transient complete heart block: in 10/20 when she was having port-a-cath placed.  5. HTN 6. Hyperlipidemia.  7. Breast cancer: She was diagnosed with right breast cancer, ER+/PR+/HER2+ in 9/20.  She had right lumpectomy in 10/20.  Starting in 11/20, she has been getting 12 cycles of Taxol/Herceptin to be followed by Herceptin alone to complete a year.  8. Chronic systolic CHF: She has had mildly decreased EF in the past, possibly related to LBBB.  Nonischemic cardiomyopathy.  - Echo (4/17): EF 50-55% - Cardiolite (4/17): EF 48% - Echo (10/20): EF 55-60% (reviewed, think more like 50% but technically difficult).  - Echo (1/21): EF 30-35%, mild LVH, septal dyssynchrony.  - Echo (3/21): EF 30-35% with prominent septal-lateral dyssynchrony.  - LHC/RHC (4/21): mean RA 1, PA 13/3, mean PCWP 2, CI 3.74, no significant CAD.  - Cardiac MRI (5/21): EF 36% with septal-lateral dyssynchrony, RV EF normal 45%, no LGE.  - Echo (10/21): EF 40-45%, septal-lateral dyssynchrony.  - Echo (4/22): EF 45% with septal-lateral dyssynchrony, normal RV. - Echo (4/23): EF 45% with septal-lateral dyssynchrony, normal RV, IVC normal.  - Echo (5/24): EF 45-50%, septal-lateral dyssynchrony, RV normal, IVC normal.   Social History   Socioeconomic History   Marital status: Married    Spouse name: Tonja Jezewski   Number  of children: 2   Years of education: Not on file   Highest education level: 12th grade  Occupational History   Occupation: CSR    Employer: [employer]   Occupation: retired  Tobacco Use   Smoking status: Never   Smokeless tobacco: Never  Vaping Use   Vaping status: Never Used  Substance and Sexual Activity   Alcohol use:  No   Drug use: No   Sexual activity: Not Currently    Birth control/protection: Pill    Comment: Tubes tied  Other Topics Concern   Not on file  Social History Narrative   Regular Exercise -  NO   Are you right handed or left handed? Right Handed    Are you currently employed ? No    What is your current occupation? Retired   Do you live at home alone? No   Who lives with you? Husband, Daughter, and Lucila [place]   What type of home do you live in: 1 story or 2 story? Lives in a one story home       Social Drivers of Health   Financial Resource Strain: Low Risk  (04/26/2023)   Overall Financial Resource Strain (CARDIA)    Difficulty of Paying Living Expenses: Not very hard  Food Insecurity: No Food Insecurity (04/26/2023)   Hunger Vital Sign    Worried About Running Out of Food in the Last Year: Never true    Ran Out of Food in the Last Year: Never true  Recent Concern: Food Insecurity - Food Insecurity Present (03/14/2023)   Hunger Vital Sign    Worried About Running Out of Food in the Last Year: Never true    Ran Out of Food in the Last Year: Sometimes true  Transportation Needs: No Transportation Needs (04/26/2023)   PRAPARE - Administrator, Civil Service (Medical): No    Lack of Transportation (Non-Medical): No  Physical Activity: Inactive (04/26/2023)   Exercise Vital Sign    Days of Exercise per Week: 0 days    Minutes of Exercise per Session: 10 min  Stress: Stress Concern Present (04/26/2023)   [employer] of Occupational Health - Occupational Stress Questionnaire    Feeling of Stress : To some extent  Social Connections: Moderately Isolated (04/26/2023)   Social Connection and Isolation Panel [NHANES]    Frequency of Communication with Friends and Family: More than three times a week    Frequency of Social Gatherings with Friends and Family: Three times a week    Attends Religious Services: Never    Active Member of Clubs or Organizations: No    Attends  Banker Meetings: Never    Marital Status: Married  Catering manager Violence: Not At Risk (12/18/2022)   Humiliation, Afraid, Rape, and Kick questionnaire    Fear of Current or Ex-Partner: No    Emotionally Abused: No    Physically Abused: No    Sexually Abused: No   Family History  Problem Relation Age of Onset   Liver disease Mother    Dementia Mother    Diabetes Mother        borderline   Depression Mother    Coronary artery disease Father    Heart attack Father    Hypertension Father    Heart disease Father    Early death Father    Cancer Other        leukemia   Stroke Maternal Grandfather    Alcohol abuse Maternal Grandfather  Hyperlipidemia Brother        Amyloidosis   Cancer Brother    Hypertension Brother    Diabetes Maternal Grandmother    Cancer Paternal Uncle        unknown   Heart attack Paternal Grandmother    Heart attack Paternal Uncle    Hypertension Brother    Diabetes Daughter        borderline   Diabetes Sister    Heart disease Sister    Kidney disease Sister    Heart disease Brother    Colon cancer Neg Hx    Esophageal cancer Neg Hx    Rectal cancer Neg Hx    Stomach cancer Neg Hx    Current Outpatient Medications  Medication Sig Dispense Refill   acetaminophen (TYLENOL) 500 MG tablet Take 1,000 mg by mouth every 6 (six) hours as needed for moderate pain.     anastrozole (ARIMIDEX) 1 MG tablet Take 1 tablet (1 mg total) by mouth daily. 90 tablet 3   aspirin 81 MG EC tablet Take 81 mg by mouth daily.     buPROPion (WELLBUTRIN XL) 300 MG 24 hr tablet Take 1 tablet (300 mg total) by mouth every morning. 90 tablet 1   carvedilol (COREG) 6.25 MG tablet Take 1 tablet (6.25 mg total) by mouth 2 (two) times daily with a meal. (Patient taking differently: Take 12.5 mg by mouth 2 (two) times daily with a meal.) 180 tablet 3   Cholecalciferol (VITAMIN D) 50 MCG (2000 UT) tablet Take 2,000 Units by mouth daily.     Continuous Blood Gluc  Receiver (FREESTYLE LIBRE 2 READER) DEVI 1 each by Does not apply route daily. 6 each 3   Continuous Glucose Sensor (FREESTYLE LIBRE 2 SENSOR) MISC APPLY AS DIRECTED EVERY 14 DAYS 6 each 3   Dulaglutide (TRULICITY) 3 MG/0.5ML SOAJ Inject 3 mg into the skin once a week. (Patient taking differently: Inject 3 mg into the skin once a week. ON SATURDAYS) 6 mL 3   EASY COMFORT PEN NEEDLES 32G X 4 MM MISC USE AS DIRECTED EVERY DAY 100 each 3   ENTRESTO 24-26 MG TAKE 1 TABLET BY MOUTH TWICE DAILY 60 tablet 11   fluticasone (FLONASE) 50 MCG/ACT nasal spray Place 2 sprays into both nostrils as needed for allergies or rhinitis.     furosemide (LASIX) 40 MG tablet Take 1 tablet (40 mg total) by mouth as needed. For weight gain of 3ln in 24 hours or 5lb in a week     JARDIANCE 25 MG TABS tablet TAKE 1 TABLET BY MOUTH DAILY 90 tablet 3   LANTUS SOLOSTAR 100 UNIT/ML Solostar Pen INJECT SUBCUTANEOUSLY 12-14  UNITS AT BEDTIME (Patient taking differently: INJECT SUBCUTANEOUSLY 20 UNITS AT BEDTIME)     meclizine (ANTIVERT) 25 MG tablet Take 25 mg by mouth 3 (three) times daily as needed for dizziness.     metFORMIN (GLUCOPHAGE) 1000 MG tablet Take 1 tablet (1,000 mg total) by mouth 2 (two) times daily with a meal. 180 tablet 3   omeprazole (PRILOSEC) 40 MG capsule TAKE 1 CAPSULE BY MOUTH 30-60 MINUTES PRIOR TO BREAKFAST AND DINNER 60 capsule 3   Probiotic Product (PROBIOTIC ADVANCED PO) Take 1 tablet by mouth in the morning and at bedtime.     repaglinide (PRANDIN) 1 MG tablet Take 1 tablet (1 mg total) by mouth daily before supper.     rosuvastatin (CRESTOR) 10 MG tablet TAKE 1 TABLET BY MOUTH DAILY 30 tablet 11  sertraline (ZOLOFT) 100 MG tablet Take 1 tablet (100 mg total) by mouth daily. 90 tablet 3   spironolactone (ALDACTONE) 25 MG tablet TAKE 1 TABLET BY MOUTH DAILY 90 tablet 3   Vitamin E 180 MG CAPS Take 180 mg by mouth at bedtime.      No current facility-administered medications for this encounter.    Wt Readings from Last 3 Encounters:  09/13/23 59.7 kg (131 lb 9.6 oz)  06/24/23 60.5 kg (133 lb 6.4 oz)  06/03/23 59 kg (130 lb)   No data found.  Wt Readings from Last 3 Encounters:  09/13/23 59.7 kg (131 lb 9.6 oz)  06/24/23 60.5 kg (133 lb 6.4 oz)  06/03/23 59 kg (130 lb)   BP 120/74   Pulse 84   Wt 59.7 kg (131 lb 9.6 oz)   SpO2 99%   BMI 24.07 kg/m   Physical Exam General:  NAD. No resp difficulty, walked into clinic with cane. HEENT: Normal Neck: Supple. No JVD. Cor: Regular rate & rhythm. No rubs, gallops or murmurs. Lungs: Clear Abdomen: Soft, nontender, nondistended.  Extremities: No cyanosis, clubbing, rash, edema Neuro: Alert & oriented x 3, moves all 4 extremities w/o difficulty. Affect pleasant.   Assessment/Plan: 1. Chronic systolic CHF:  Nonischemic cardiomyopathy.  Echo in 1/21 showed EF down to 30-35% with septal-lateral dyssynchrony.  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  Based on the timeline, the most likely cause for fall in EF seems to be Herceptin use.  She is now off Herceptin, but echo in 3/21 showed EF still in the 30-35% range.  LHC/RHC in 4/21 showed low filling pressures, preserved cardiac output, and no significant CAD.  Cardiac MRI in 5/21 showed EF 36%, no LGE (no evidence for infiltrative disease). Of note, brother died of sudden death so also consider familial cardiomyopathy.  Echo in 10/21 showed EF up a bit to 40-45% with dyssynchrony due to LBBB.  Echo in 4/22 showed EF 45%, septal-lateral dyssynchrony. Echo in 4/23 showed EF 45% with septal-lateral dyssynchrony.  Echo in 5/24 showed EF 45-50%, septal-lateral dyssynchrony, RV normal, IVC normal.   NYHA class II-early III symptoms, fatigue/deconditioning and imbalance seem to be her major limiters.  She is not volume overloaded on exam. - She does not need regular Lasix.   - Continue Entresto at 24/26 bid. BMET today. - Continue Coreg 12.5 mg bid.  - Continue spironolactone 25 mg daily  - Continue Jardiance. No GU symptoms. - EF is now out of range for CRT-D. Update echo next visit. 2. 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  - Follow closely with use of Coreg.  ECG as above. 3. LBBB: Chronic.  ?LBBB cardiomyopathy, though QRS today was not markedly prolonged today (114 msec).   4. Breast cancer: She has completed Herceptin. - No change. 5. Snoring and daytime fatigue: She snores, had a sleep study "years ago" and it was negative. - Arrange home sleep study, she is agreeable to completing this. 6. Fatigue: Likely multifactorial. Recent iron studies stable. - As above, arrange sleep study - Increase activity level, recommended [facility] PREP program. She will think about this, she is also considering water aerobics for hip OA. 7. Hip OA: she is considering L hip replacement. She is at moderate, but not prohibitive risk for surgery. We discussed this today. - RCRI score = 2  pts (10.1% risk of major cardiac event).  Follow up in 4 months with Dr. Shirlee Latch + echo.  Anderson [facility] FNP-BC 09/13/2023

## 2023-09-13 NOTE — Progress Notes (Signed)
 Height:     Weight: BMI:  Today's Date:  STOP BANG RISK ASSESSMENT S (snore) Have you been told that you snore?     YES   T (tired) Are you often tired, fatigued, or sleepy during the day?   YES  O (obstruction) Do you stop breathing, choke, or gasp during sleep? YES   P (pressure) Do you have or are you being treated for high blood pressure? YES   B (BMI) Is your body index greater than 35 kg/m? NO   A (age) Are you 73 years old or older? YES   N (neck) Do you have a neck circumference greater than 16 inches?   NO   G (gender) Are you a female? NO   TOTAL STOP/BANG "YES" ANSWERS 5                                                                       For Office Use Only              Procedure Order Form    YES to 3+ Stop Bang questions OR two clinical symptoms - patient qualifies for WatchPAT (CPT 95800)      Clinical Notes: Will consult Sleep Specialist and refer for management of therapy due to patient increased risk of Sleep Apnea. Ordering a sleep study due to the following two clinical symptoms: Excessive daytime sleepiness G47.10  / Morning Headaches G44.221 / Difficulty concentrating R41.840 / Memory problems or poor judgment G31.84 / Personality changes or irritability R45.4 / Loud snoring R06.83 / Unrefreshed by sleep G47.8 History of high blood pressure R03.0 / Insomnia G47.00

## 2023-10-07 ENCOUNTER — Ambulatory Visit
Admission: RE | Admit: 2023-10-07 | Discharge: 2023-10-07 | Disposition: A | Discharge status: Home / Self Care | Source: Ambulatory Visit | Attending: Family Medicine | Admitting: Family Medicine

## 2023-10-07 VITALS — BP 125/78 | HR 90 | Temp 98.3°F | Resp 18

## 2023-10-07 DIAGNOSIS — L089 Local infection of the skin and subcutaneous tissue, unspecified: Secondary | ICD-10-CM | POA: Diagnosis not present

## 2023-10-07 MED ORDER — DOXYCYCLINE HYCLATE 100 MG PO CAPS: 100.0000 mg | ORAL_CAPSULE | Freq: Two times a day (BID) | ORAL | 0 refills | Status: DC

## 2023-10-07 NOTE — ED Provider Notes (Signed)
 [facility]    CSN: [id number] Arrival date & time: 10/07/23  1123      History   Chief Complaint Chief Complaint  Patient presents with   Wound Check    On left arm.  Scratch from my dog.  Won't heal. - Entered by patient    HPI Margaret Norman is a 72 y.o. female.   Patient here today for evaluation of a wound involving her left arm.  Patient reports that a few weeks ago her dog scratched her forearm.  Patient is a diabetic and is concerned that wound has not healed.  Tetanus vaccine was in 01/2019.  Has been treating wound with topical bacitracin but is concerned as wound has not completely healed. Past Medical History:  Diagnosis Date   Allergy AMOXICILLAN   Arthritis    Chest pain    Nuclear, adenosine,  December, 2013, low risk nuclear scan with small, moderate in intensity, fixed anteroseptal defect. This is possibly related to an LBBB versus small prior infarct. 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   Type II or unspecified type diabetes mellitus without mention of complication, not stated as uncontrolled     Patient Active Problem List   Diagnosis Date Noted   Multiple thyroid nodules 09/20/2020   Port-A-Cath in place 06/10/2019   Breast cancer (HCC) 04/15/2019   Second degree AV block    Malignant neoplasm of upper-outer quadrant of right breast in female, estrogen receptor positive (HCC) 03/25/2019   Thyromegaly 01/10/2018   Overweight (BMI 25.0-29.9) 08/13/2017   Upper respiratory tract infection 07/30/2017   Type 2 diabetes mellitus with hyperglycemia, with long-term current use of insulin (HCC) 03/19/2017   Thiamine deficiency 01/04/2017    Tinnitus aurium, 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   Deficiency anemia 04/30/2016   Routine general medical examination at a health care facility 04/27/2015   Abdominal wall hernia 04/26/2015   Family history of hemochromatosis 09/13/2014   Acute maxillary sinusitis 05/14/2014   History of colonic polyps 03/10/2014   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   Lumbosacral spondylosis without myelopathy 05/12/2013   Insomnia, persistent 02/04/2013   Obesity (BMI 30-39.9) 02/04/2013   Asthma with exacerbation 09/22/2012   Visit for screening mammogram 07/04/2012   Nephrolithiasis    HTN (hypertension)    Gallstones    LBBB (left bundle branch block)    Leukocytosis 12/20/2008   Hyperlipidemia with target LDL less than 100 09/04/2007   Chronic depression 09/04/2007   GERD 09/04/2007    Past Surgical History:  Procedure Laterality Date   APPENDECTOMY     BREAST BIOPSY Left 10/2017   BREAST BIOPSY Right 03/2019   BREAST BIOPSY Right 08/26/2023   [place] RT BREAST BX W LOC DEV 1ST LESION IMG BX SPEC US GUIDE 08/26/2023 [facility] MAMMO[facility]   BREAST LUMPECTOMY Right 2020   BREAST LUMPECTOMY WITH RADIOACTIVE SEED AND SENTINEL LYMPH NODE BIOPSY Right 04/15/2019   Procedure: RIGHT BREAST PARTIAL MASTECTOMY WITH RADIOACTIVE SEED AND SENTINEL LYMPH NODE BIOPSY;  Surgeon: Abigail Miyamoto, MD;  Location: [facility] OR;  Service: General;  Laterality: Right;   CESAREAN SECTION     x2 ? w/appy  CHOLECYSTECTOMY     ESOPHAGOGASTRODUODENOSCOPY     HERNIA REPAIR     KNEE ARTHROSCOPY Right 11/2005   LIVER BIOPSY     PANCREATIC CYST EXCISION     PORT-A-CATH REMOVAL Left 04/25/2020   Procedure: REMOVAL PORT-A-CATH;  Surgeon: Oza Blumenthal, MD;  Location: [facility];  Service: General;  Laterality: Left;   PORTACATH PLACEMENT Left 04/15/2019   Procedure: INSERTION PORT-A-CATH WITH ULTRASOUND;  Surgeon: Oza Blumenthal, MD;  Location: [facility] OR;  Service: General;  Laterality: Left;   RIGHT/LEFT HEART CATH  AND CORONARY ANGIOGRAPHY N/A 09/28/2019   Procedure: RIGHT/LEFT HEART CATH AND CORONARY ANGIOGRAPHY;  Surgeon: Darlis Eisenmenger, MD;  Location: [facility] INVASIVE CV LAB;  Service: Cardiovascular;  Laterality: N/A;   SPLENECTOMY     TONSILLECTOMY AND ADENOIDECTOMY     TUBAL LIGATION  06/24/78    OB History   No obstetric history on file.      Home Medications    Prior to Admission medications   Medication Sig Start Date End Date Taking? Authorizing Provider  ENTRESTO 24-26 MG TAKE 1 TABLET BY MOUTH TWICE DAILY 09/12/23  Yes McLean, Dalton S, MD  JARDIANCE 25 MG TABS tablet TAKE 1 TABLET BY MOUTH DAILY 11/09/22  Yes Emilie Harden, MD  metFORMIN (GLUCOPHAGE) 1000 MG tablet Take 1 tablet (1,000 mg total) by mouth 2 (two) times daily with a meal. 09/26/22  Yes Emilie Harden, MD  omeprazole (PRILOSEC) 40 MG capsule TAKE 1 CAPSULE BY MOUTH 30-60 MINUTES PRIOR TO BREAKFAST AND DINNER 09/10/23  Yes Adra Alanis, FNP  rosuvastatin (CRESTOR) 10 MG tablet TAKE 1 TABLET BY MOUTH DAILY 08/13/23  Yes Adra Alanis, FNP  acetaminophen (TYLENOL) 500 MG tablet Take 1,000 mg by mouth every 6 (six) hours as needed for moderate pain.    [provider]  anastrozole (ARIMIDEX) 1 MG tablet Take 1 tablet (1 mg total) by mouth daily. 05/10/23   Gudena, Vinay, MD  aspirin 81 MG EC tablet Take 81 mg by mouth daily.    [provider]  buPROPion (WELLBUTRIN XL) 300 MG 24 hr tablet Take 1 tablet (300 mg total) by mouth every morning. 03/21/23   Adra Alanis, FNP  carvedilol (COREG) 6.25 MG tablet Take 1 tablet (6.25 mg total) by mouth 2 (two) times daily with a meal. Patient taking differently: Take 12.5 mg by mouth 2 (two) times daily with a meal. 10/30/22   Darlis Eisenmenger, MD  Cholecalciferol (VITAMIN D) 50 MCG (2000 UT) tablet Take 2,000 Units by mouth daily.    [provider]  Continuous Blood Gluc Receiver (FREESTYLE LIBRE 2 READER) DEVI 1 each by Does not  apply route daily. 01/17/22   Emilie Harden, MD  Continuous Glucose Sensor (FREESTYLE LIBRE 2 SENSOR) MISC APPLY AS DIRECTED EVERY 14 DAYS 05/03/23   Emilie Harden, MD  doxycycline (VIBRAMYCIN) 100 MG capsule Take 1 capsule (100 mg total) by mouth 2 (two) times daily. 10/07/23   Buena Carmine, NP  Dulaglutide (TRULICITY) 3 MG/0.5ML SOAJ Inject 3 mg into the skin once a week. Patient taking differently: Inject 3 mg into the skin once a week. ON SATURDAYS 06/24/23   Emilie Harden, MD  EASY COMFORT PEN NEEDLES 32G X 4 MM MISC USE AS DIRECTED EVERY DAY 05/27/23   Emilie Harden, MD  fluticasone [facility]) 50 MCG/ACT nasal spray Place 2 sprays into both nostrils as needed for allergies or rhinitis.    [provider]  furosemide (LASIX) 40  MG tablet Take 1 tablet (40 mg total) by mouth as needed. For weight gain of 3ln in 24 hours or 5lb in a week 05/16/23 09/12/24  Laurey Morale, MD  LANTUS SOLOSTAR 100 UNIT/ML Solostar Pen INJECT SUBCUTANEOUSLY 12-14  UNITS AT BEDTIME Patient taking differently: INJECT SUBCUTANEOUSLY 20 UNITS AT BEDTIME 02/12/23   Carlus Pavlov, MD  meclizine (ANTIVERT) 25 MG tablet Take 25 mg by mouth 3 (three) times daily as needed for dizziness.    [provider]  Probiotic Product (PROBIOTIC ADVANCED PO) Take 1 tablet by mouth in the morning and at bedtime.    [provider]  repaglinide (PRANDIN) 1 MG tablet Take 1 tablet (1 mg total) by mouth daily before supper. 06/24/23   Carlus Pavlov, MD  sertraline (ZOLOFT) 100 MG tablet Take 1 tablet (100 mg total) by mouth daily. 03/21/23   Olive Bass, FNP  spironolactone (ALDACTONE) 25 MG tablet TAKE 1 TABLET BY MOUTH DAILY 12/03/22   Laurey Morale, MD  Vitamin E 180 MG CAPS Take 180 mg by mouth at bedtime.     [provider]    Family History Family History  Problem Relation Age of Onset   Liver disease Mother    Dementia Mother    Diabetes Mother         borderline   Depression Mother    Coronary artery disease Father    Heart attack Father    Hypertension Father    Heart disease Father    Early death Father    Cancer Other        leukemia   Stroke Maternal Grandfather    Alcohol abuse Maternal Grandfather    Hyperlipidemia Brother        Amyloidosis   Cancer Brother    Hypertension Brother    Diabetes Maternal Grandmother    Cancer Paternal Uncle        unknown   Heart attack Paternal Grandmother    Heart attack Paternal Uncle    Hypertension Brother    Diabetes Daughter        borderline   Diabetes Sister    Heart disease Sister    Kidney disease Sister    Heart disease Brother    Colon cancer Neg Hx    Esophageal cancer Neg Hx    Rectal cancer Neg Hx    Stomach cancer Neg Hx     Social History Social History   Tobacco Use   Smoking status: Never   Smokeless tobacco: Never  Vaping Use   Vaping status: Never Used  Substance Use Topics   Alcohol use: No   Drug use: No     Allergies   Invokana [canagliflozin] and Amoxicillin   Review of Systems Review of Systems Pertinent negatives listed in HPI  Physical Exam Triage Vital Signs ED Triage Vitals  Encounter Vitals Group     BP 10/07/23 1147 125/78     Systolic BP Percentile --      Diastolic BP Percentile --      Pulse Rate 10/07/23 1147 90     Resp 10/07/23 1147 18     Temp 10/07/23 1147 98.3 F (36.8 C)     Temp Source 10/07/23 1147 Oral     SpO2 10/07/23 1147 95 %     Weight --      Height --      Head Circumference --      Peak Flow --  Pain Score 10/07/23 1144 3     Pain Loc --      Pain Education --      Exclude from Growth Chart --    No data found.  Updated Vital Signs BP 125/78 (BP Location: Left Arm)   Pulse 90   Temp 98.3 F (36.8 C) (Oral)   Resp 18   SpO2 95%   Visual Acuity Right Eye Distance:   Left Eye Distance:   Bilateral Distance:    Right Eye Near:   Left Eye Near:    Bilateral Near:     Physical  Exam Vitals reviewed.  Constitutional:      Appearance: Normal appearance.  HENT:     Head: Normocephalic and atraumatic.  Cardiovascular:     Rate and Rhythm: Normal rate and regular rhythm.  Pulmonary:     Effort: Pulmonary effort is normal.     Breath sounds: Normal breath sounds.  Musculoskeletal:       Arms:     Cervical back: Normal range of motion and neck supple.  Skin:    General: Skin is warm.  Neurological:     General: No focal deficit present.     Mental Status: She is alert and oriented to person, place, and time.  Psychiatric:        Mood and Affect: Mood normal.      UC Treatments / Results  Labs (all labs ordered are listed, but only abnormal results are displayed) Labs Reviewed - No data to display  EKG   Radiology No results found.  Procedures Procedures (including critical care time)  Medications Ordered in UC Medications - No data to display  Initial Impression / Assessment and Plan / UC Course  I have reviewed the triage vital signs and the nursing notes.  Pertinent labs & imaging results that were available during my care of the patient were reviewed by me and considered in my medical decision making (see chart for details).    Arterial skin infection involving the upper left extremity With doxycycline twice daily for 10 days.  Continue application of Bactroban and leave open to air when at home. Return precautions given if concerns of delayed wound healing develop. Final Clinical Impressions(s) / UC Diagnoses   Final diagnoses:  Bacterial skin infection of upper extremity   Discharge Instructions   None    ED Prescriptions     Medication Sig Dispense Auth. Provider   doxycycline (VIBRAMYCIN) 100 MG capsule  (Status: Discontinued) Take 1 capsule (100 mg total) by mouth 2 (two) times daily. 20 capsule Buena Carmine, NP   doxycycline (VIBRAMYCIN) 100 MG capsule Take 1 capsule (100 mg total) by mouth 2 (two) times daily. 20  capsule Buena Carmine, NP      PDMP not reviewed this encounter.   Buena Carmine, NP 10/07/23 1212

## 2023-10-07 NOTE — ED Triage Notes (Signed)
 On left arm.  Scratch from my dog.  Won't heal. - Entered by patient  Pt says the injury has been on left forearm for several weeks. Pt is diabetic.

## 2023-10-12 ENCOUNTER — Other Ambulatory Visit (HOSPITAL_COMMUNITY): Payer: Self-pay | Admitting: Cardiology

## 2023-10-15 ENCOUNTER — Encounter: Payer: Self-pay | Admitting: Family

## 2023-10-16 ENCOUNTER — Encounter: Payer: Self-pay | Admitting: Internal Medicine

## 2023-10-22 ENCOUNTER — Other Ambulatory Visit: Payer: Self-pay | Admitting: Internal Medicine

## 2023-10-22 DIAGNOSIS — E1165 Type 2 diabetes mellitus with hyperglycemia: Secondary | ICD-10-CM

## 2023-10-23 ENCOUNTER — Ambulatory Visit (INDEPENDENT_AMBULATORY_CARE_PROVIDER_SITE_OTHER): Payer: PPO | Admitting: Internal Medicine

## 2023-10-23 ENCOUNTER — Encounter: Payer: Self-pay | Admitting: Internal Medicine

## 2023-10-23 VITALS — BP 120/60 | HR 95 | Ht 62.0 in | Wt 127.6 lb

## 2023-10-23 DIAGNOSIS — E1165 Type 2 diabetes mellitus with hyperglycemia: Secondary | ICD-10-CM

## 2023-10-23 DIAGNOSIS — E042 Nontoxic multinodular goiter: Secondary | ICD-10-CM

## 2023-10-23 DIAGNOSIS — Z794 Long term (current) use of insulin: Secondary | ICD-10-CM | POA: Diagnosis not present

## 2023-10-23 DIAGNOSIS — E785 Hyperlipidemia, unspecified: Secondary | ICD-10-CM

## 2023-10-23 LAB — POCT GLYCOSYLATED HEMOGLOBIN (HGB A1C): Hemoglobin A1C: 6.5 % — AB (ref 4.0–5.6)

## 2023-10-23 NOTE — Patient Instructions (Addendum)
 Please continue: - Metformin  2000 mg with dinner - Repaglinide  (Prandin ) 1 mg before a dinner - Jardiance  25 mg daily before breakfast - Trulicity  3 mg weekly  Please decrease: - Lantus  12 units at night    Please return in 4 months.

## 2023-10-23 NOTE — Progress Notes (Signed)
 Patient ID: Margaret Norman, female   DOB: Sep 12, 1950, 73 y.o.   MRN: [id number]   HPI: Margaret Norman is a 73 y.o.-year-old femalele, returning for follow-up for DM2, dx in 2008, insulin -dependent since 2016, uncontrolled, without long term complications.  Last visit  4 months ago.  Interim history:  No increased urination, blurry vision, chest pain.   She  has dizziness and lightheadedness and a history of vertigo.  She is walking with a cane. She recently had an infected wound on her L arm after her dog scratched her.  She continues to use topical bacitracin, and completed a course of doxycycline  as prescribed by the urgent care physician, and she feels that this is getting better, however, is not completely resolved.  Reviewed HbA1c levels: Lab Results  Component Value Date   HGBA1C 6.9 (A) 06/24/2023   HGBA1C 6.4 (A) 02/12/2023   HGBA1C 6.4 (A) 09/26/2022   HGBA1C 6.5 (A) 05/23/2022   HGBA1C 6.2 (A) 01/17/2022   HGBA1C 6.7 (A) 09/15/2021   HGBA1C 7.1 (A) 05/17/2021   HGBA1C 7.0 (A) 01/10/2021   HGBA1C 6.9 (A) 09/20/2020   HGBA1C 7.0 (A) 05/18/2020   HGBA1C 7.4 (A) 01/14/2020   HGBA1C 6.6 (A) 09/17/2019   HGBA1C 8.6 (H) 04/10/2019   HGBA1C 7.4 (A) 04/01/2019   HGBA1C 7.1 (A) 12/17/2018   HGBA1C 6.8 (A) 01/10/2018   HGBA1C 8.7 08/13/2017   HGBA1C 7.5 (H) 05/02/2017   HGBA1C 10.5 (H) 12/31/2016   HGBA1C 9.1 (H) 08/28/2016  03/19/2017: HbA1c calculated from fructosamine is 6.7%! 12/31/2016: HbA1c calculated from fructosamine: 6.9%!  Pt is on: - Metformin  1000 mg 2x a day with meals >> 2000 mg with dinner - Jardiance  25 mg daily before breakfast (PAP) - Prandin   1 mg before larger dinners -started 01/2023-actually taking it after breakfast >> most before dinner - Trulicity  1.5 >> 3 mg weekly (PAP) - Lantus  30 >> 26 >> 22 >> 18 >> 12-14 >> advised to decrease to 10-12 >> actually taking 15 units at night Stopped Actos  when started trulicity  back. She was on Trulicity  but  became expensive >> changed to Actos . She was on Invokana  >> recurrent UTIs.  Pt checks her sugars >4x a day:  Previously:  Previously:   Lowest sugar was  58 >> 66 >> 63 >> 50s >> 69; she has hypoglycemia awareness in the 70s. Highest sugar was 200s >> 280 >> 250 >> 300 (jellybeans).  Glucometer: OneTouch Verio  -No CKD, last BUN/creatinine:  Lab Results  Component Value Date   BUN 10 09/13/2023   BUN 18 04/26/2023   CREATININE 0.84 09/13/2023   CREATININE 1.07 (H) 04/26/2023   Lab Results  Component Value Date   MICRALBCREAT 1.5 03/21/2023   MICRALBCREAT 1.1 08/23/2020   MICRALBCREAT 0.8 12/31/2016   MICRALBCREAT 1.1 12/29/2015   MICRALBCREAT 1.0 09/13/2014   MICRALBCREAT 4.7 11/15/2008   MICRALBCREAT 10.7 07/12/2008   MICRALBCREAT 8.8 01/16/2007   MICRALBCREAT 8.8 07/17/2006  On Entresto .  -+ HL; last set of lipids: Lab Results  Component Value Date   CHOL 95 03/21/2023   HDL 45.00 03/21/2023   LDLCALC 28 03/21/2023   LDLDIRECT 137.9 02/04/2013   TRIG 110.0 03/21/2023   CHOLHDL 2 03/21/2023  On Crestor  10.  - last eye exam was September 11, 2023: No DR, + cataract OS.  Dr. Princella [place]. Latest eye exam: [facility].  -+ numbness and tingling in her toes - after chemotx.  She has restless leg syndrome.  Last  foot exam 09/26/2022.  Pt has FH of DM in MGM.  Multinodular goiter:   -Detected on palpation in 2021  Thyroid  ultrasound (01/26/2020): Parenchymal Echotexture: Moderately heterogenous Isthmus: 0.3 cm Right lobe: 6.2 cm x 2.1 cm x 2.2 cm Left lobe: 7.7 cm x 3.2 cm x 2.8 cm _________________________________________________________   Nodule # 1: Location: Right; Superior Maximum size: 0.9 cm; Other 2 dimensions: 0.7 cm x 0.7 cm Composition: spongiform (0) Spongiform nodule does not meet criteria for surveillance or biopsy  _________________________________________________________   Nodule # 2: Location: Right; Mid Maximum size: 1.0 cm; Other 2  dimensions: 0.9 cm x 0.6 cm Composition: spongiform (0) Spongiform nodule does not meet criteria for surveillance or biopsy  _________________________________________________________   Nodule # 3:  Location: Right; Mid Maximum size: 1.2 cm; Other 2 dimensions: 1.1 cm x 0.7 cm Composition: spongiform (0) Spongiform nodule does not meet criteria for surveillance or biopsy _________________________________________________________   Nodule # 4:  Location: Right; Inferior Maximum size: 1.0 cm; Other 2 dimensions: 0.8 cm x 0.8 cm Composition: spongiform (0) Spongiform nodule does not meet criteria for surveillance or biopsy  ________________________________________________________   Nodule # 5:  Location: Right; Inferior Maximum size: 1.5 cm; Other 2 dimensions: 1.4 cm x 0.8 cm Composition: solid/almost completely solid (2) Echogenicity: isoechoic (1) Nodule meets criteria for surveillance  _________________________________________________________   Nodule # 6: Location: Left; Superior Maximum size: 1.2 cm; Other 2 dimensions: 1.1 cm x 0.9 cm  Composition: cannot determine (2) Echogenicity: isoechoic (1) Echogenic foci: macrocalcifications (1)  Nodule meets criteria for surveillance  _______________________________________________________   Nodule # 7: Location: Left; Inferior Maximum size: 4.7 cm; Other 2 dimensions: 4.7 cm x 2.8 cm Composition: mixed cystic and solid (1) Echogenicity: isoechoic (1) Echogenic foci: macrocalcifications (1) Nodule meets criteria for biopsy  ______________________________________________________   No adenopathy   IMPRESSION: Multinodular thyroid .   Left inferior thyroid  nodule (labeled 7, TR , 4.7 cm) meets criteria for biopsy, as designated by the newly established ACR TI-RADS criteria, and referral for biopsy is recommended.   Right inferior thyroid  nodule (labeled 5, 1.5 cm, TR 3) and the left superior thyroid  nodule (labeled 6, 1.2  cm, TR 4) both meet criteria for surveillance, as designated by the newly established ACR TI-RADS criteria. Surveillance ultrasound study recommended to be performed annually up to 5 years.   FNA left inferior thyroid  nodule (02/16/2020): Benign  Thyroid  U/S (02/07/2021): Parenchymal Echotexture: Markedly heterogenous Isthmus: 0.6 cm Right lobe: 7.4 cm x 2.0 cm x 2.5 cm Left lobe: 8.4 cm x 3.2 cm x 4.3 cm _________________________________________________________   Estimated total number of nodules >/= 1 cm: 6-10  _________________________________________________________   Nodule labeled 1 superior right thyroid , slightly smaller than previous and spongiform. Nodule does not meet criteria for surveillance or biopsy   Nodule labeled 2, right thyroid , TR 2 with cystic components measures less than 1 cm. Nodule does not meet criteria for surveillance or biopsy   Nodule labeled 3, mid right thyroid . Spongiform characteristics measuring 1.3 cm. Nodule does not meet criteria for surveillance or biopsy.   Nodule labeled 4, mid right thyroid , 9 mm, spongiform characteristics. Nodule does not meet criteria for surveillance or biopsy.   Nodule labeled 5, 0.98 cm, spongiform characteristics and does not meet criteria for surveillance or biopsy.   Nodule 6, right lower thyroid , 0.96 cm. Nodule has spongiform characteristics and does not meet criteria for surveillance or biopsy.   Nodule 7, right lower thyroid , smaller than previous, now 1.0  cm. Previous nodule was labeled 5, 1.5 cm. Nodule has clearly spongiform characteristics on the current ultrasound, and has decreased in size below 1 cm. Nodule no longer meets criteria for surveillance.   Nodule labeled 8 on the left, upper left thyroid , 9 mm. Nodule has internal cystic change on the current, TR 2 characteristics, and has decreased in size below 1 cm. Nodule no longer meets criteria for continued surveillance.   The nodules at  the left inferior thyroid  labeled 9, 10, 11, appear to all be part of the previous 4.7 cm nodule which was previously biopsied 02/16/2020, and are doubtful to represent independent nodules given the available images and the comparison.   No adenopathy   Recommendations follow those established by the new ACR TI-RADS criteria (J Am Coll Radiol 2017;14:587-595).   IMPRESSION: Enlarged multinodular thyroid , as above.   Nodule inferior left thyroid  has been previously biopsied, as above. Assuming benign result, no further specific follow-up would be indicated.  Pt denies: - feeling nodules in neck - hoarseness - dysphagia - just with pills (not new) - choking  Latest TSH was normal Lab Results  Component Value Date   TSH 0.80 03/21/2023   She sees Cardiology - Dr. Mitzie Anda >> on Entresto , Coreg . She also has a history of breast cancer, s/p chemoradiation therapy. She fractured humerus in 02/2020 after tripping >> this is healing.  She had a DXA scan >> normal.  She is on Arimidex .  She had lightheadedness and dizziness >> she was in the emergency room with vertigo 06/30/2021.  ROS: + See HPI  I reviewed pt's medications, allergies, PMH, social hx, family hx, and changes were documented in the history of present illness. Otherwise, unchanged from my initial visit note.  Past Medical History:  Diagnosis Date   Allergy AMOXICILLAN   Arthritis    Chest pain    Nuclear, adenosine ,  December, 2013, low risk nuclear scan with small, moderate in intensity, fixed anteroseptal defect. This is possibly related to an LBBB versus small prior infarct. 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    Type II or unspecified type diabetes mellitus without mention of complication, not stated as uncontrolled    Past Surgical History:  Procedure Laterality Date   APPENDECTOMY     BREAST BIOPSY Left 10/2017   BREAST BIOPSY Right 03/2019   BREAST BIOPSY Right 08/26/2023   US  RT BREAST BX W LOC DEV 1ST LESION IMG BX SPEC US  GUIDE 08/26/2023 [facility]   BREAST LUMPECTOMY Right 2020   BREAST LUMPECTOMY WITH RADIOACTIVE SEED AND SENTINEL LYMPH NODE BIOPSY Right 04/15/2019   Procedure: RIGHT BREAST PARTIAL MASTECTOMY WITH RADIOACTIVE SEED AND SENTINEL LYMPH NODE BIOPSY;  Surgeon: Oza Blumenthal, MD;  Location: [facility] OR;  Service: General;  Laterality: Right;   CESAREAN SECTION     x2 ? w/appy   CHOLECYSTECTOMY     ESOPHAGOGASTRODUODENOSCOPY     HERNIA REPAIR     KNEE ARTHROSCOPY Right 11/2005   LIVER BIOPSY     PANCREATIC CYST EXCISION     PORT-A-CATH REMOVAL Left 04/25/2020   Procedure: REMOVAL PORT-A-CATH;  Surgeon: Oza Blumenthal, MD;  Location: [facility];  Service: General;  Laterality: Left;   PORTACATH PLACEMENT Left 04/15/2019   Procedure: INSERTION PORT-A-CATH WITH ULTRASOUND;  Surgeon: Oza Blumenthal, MD;  Location: [facility] OR;  Service: General;  Laterality: Left;   RIGHT/LEFT HEART CATH AND CORONARY ANGIOGRAPHY N/A 09/28/2019   Procedure: RIGHT/LEFT HEART CATH AND CORONARY ANGIOGRAPHY;  Surgeon: Darlis Eisenmenger, MD;  Location: [facility] INVASIVE CV LAB;  Service: Cardiovascular;  Laterality: N/A;   SPLENECTOMY     TONSILLECTOMY AND ADENOIDECTOMY     TUBAL LIGATION  06/24/78   Social History   Socioeconomic History   Marital status: Married    Spouse name: Darionna Selvera   Number of children: 2   Years of education: Not on file   Highest education level: 12th grade  Occupational History   Occupation: CSR    Employer: [employer]   Occupation: retired  Tobacco Use   Smoking status: Never   Smokeless tobacco: Never  Vaping Use   Vaping status: Never  Used  Substance and Sexual Activity   Alcohol use: No   Drug use: No   Sexual activity: Not Currently    Birth control/protection: Pill    Comment: Tubes tied  Other Topics Concern   Not on file  Social History Narrative   Regular Exercise -  NO   Are you right handed or left handed? Right Handed    Are you currently employed ? No    What is your current occupation? Retired   Do you live at home alone? No   Who lives with you? Husband, Daughter, and Dietra Fraction   What type of home do you live in: 1 story or 2 story? Lives in a one story home       Social Drivers of Health   Financial Resource Strain: Low Risk  (04/26/2023)   Overall Financial Resource Strain (CARDIA)    Difficulty of Paying Living Expenses: Not very hard  Food Insecurity: No Food Insecurity (04/26/2023)   Hunger Vital Sign    Worried About Running Out of Food in the Last Year: Never true    Ran Out of Food in the Last Year: Never true  Recent Concern: Food Insecurity - Food Insecurity Present (03/14/2023)   Hunger Vital Sign    Worried About Running Out of Food in the Last Year: Never true    Ran Out of Food in the Last Year: Sometimes true  Transportation Needs: No Transportation Needs (04/26/2023)   PRAPARE - Administrator, Civil Service (Medical): No    Lack of Transportation (Non-Medical): No  Physical Activity: Inactive (04/26/2023)   Exercise Vital Sign    Days of Exercise per Week: 0 days    Minutes of Exercise per Session: 10 min  Stress: Stress Concern Present (04/26/2023)   [employer] of Occupational Health - Occupational Stress Questionnaire    Feeling of Stress : To some extent  Social Connections: Moderately Isolated (04/26/2023)   Social Connection and Isolation Panel [NHANES]    Frequency of Communication with Friends and Family: More than three times a week    Frequency of Social Gatherings with Friends and Family: Three times a week    Attends Religious Services: Never     Active Member of Clubs or Organizations: No    Attends Banker Meetings: Never    Marital Status: Married  Catering manager Violence: Not At Risk (12/18/2022)   Humiliation, Afraid, Rape, and Kick questionnaire  Fear of Current or Ex-Partner: No    Emotionally Abused: No    Physically Abused: No    Sexually Abused: No   Current Outpatient Medications on File Prior to Visit  Medication Sig Dispense Refill   acetaminophen  (TYLENOL ) 500 MG tablet Take 1,000 mg by mouth every 6 (six) hours as needed for moderate pain.     anastrozole  (ARIMIDEX ) 1 MG tablet Take 1 tablet (1 mg total) by mouth daily. 90 tablet 3   aspirin 81 MG EC tablet Take 81 mg by mouth daily.     buPROPion  (WELLBUTRIN  XL) 300 MG 24 hr tablet Take 1 tablet (300 mg total) by mouth every morning. 90 tablet 1   carvedilol  (COREG ) 12.5 MG tablet Take 1 tablet (12.5 mg total) by mouth 2 (two) times daily with a meal. 180 tablet 3   Cholecalciferol (VITAMIN D ) 50 MCG (2000 UT) tablet Take 2,000 Units by mouth daily.     Continuous Blood Gluc Receiver (FREESTYLE LIBRE 2 READER) DEVI 1 each by Does not apply route daily. 6 each 3   Continuous Glucose Sensor (FREESTYLE LIBRE 2 SENSOR) MISC APPLY AS DIRECTED EVERY 14 DAYS 6 each 3   doxycycline  (VIBRAMYCIN ) 100 MG capsule Take 1 capsule (100 mg total) by mouth 2 (two) times daily. 20 capsule 0   Dulaglutide  (TRULICITY ) 3 MG/0.5ML SOAJ Inject 3 mg into the skin once a week. (Patient taking differently: Inject 3 mg into the skin once a week. ON SATURDAYS) 6 mL 3   EASY COMFORT PEN NEEDLES 32G X 4 MM MISC USE AS DIRECTED EVERY DAY 100 each 3   ENTRESTO  24-26 MG TAKE 1 TABLET BY MOUTH TWICE DAILY 60 tablet 11   fluticasone  (FLONASE ) 50 MCG/ACT nasal spray Place 2 sprays into both nostrils as needed for allergies or rhinitis.     furosemide  (LASIX ) 40 MG tablet Take 1 tablet (40 mg total) by mouth as needed. For weight gain of 3ln in 24 hours or 5lb in a week     JARDIANCE  25  MG TABS tablet TAKE 1 TABLET BY MOUTH DAILY 90 tablet 3   LANTUS  SOLOSTAR 100 UNIT/ML Solostar Pen INJECT SUBCUTANEOUSLY 12-14  UNITS AT BEDTIME (Patient taking differently: INJECT SUBCUTANEOUSLY 20 UNITS AT BEDTIME)     meclizine  (ANTIVERT ) 25 MG tablet Take 25 mg by mouth 3 (three) times daily as needed for dizziness.     metFORMIN  (GLUCOPHAGE ) 1000 MG tablet Take 1 tablet (1,000 mg total) by mouth 2 (two) times daily with a meal. 180 tablet 3   omeprazole  (PRILOSEC) 40 MG capsule TAKE 1 CAPSULE BY MOUTH 30-60 MINUTES PRIOR TO BREAKFAST AND DINNER 60 capsule 3   Probiotic Product (PROBIOTIC ADVANCED PO) Take 1 tablet by mouth in the morning and at bedtime.     repaglinide  (PRANDIN ) 1 MG tablet Take 1 tablet (1 mg total) by mouth daily before supper.     rosuvastatin  (CRESTOR ) 10 MG tablet TAKE 1 TABLET BY MOUTH DAILY 30 tablet 11   sertraline  (ZOLOFT ) 100 MG tablet Take 1 tablet (100 mg total) by mouth daily. 90 tablet 3   spironolactone  (ALDACTONE ) 25 MG tablet TAKE 1 TABLET BY MOUTH DAILY 90 tablet 3   Vitamin E 180 MG CAPS Take 180 mg by mouth at bedtime.      No current facility-administered medications on file prior to visit.   Allergies  Allergen Reactions   Invokana  [Canagliflozin ] Other (See Comments)    UTI's   Amoxicillin  Other (See Comments)  Sore tongue Did it involve swelling of the face/tongue/throat, SOB, or low BP? No Did it involve sudden or severe rash/hives, skin peeling, or any reaction on the inside of your mouth or nose? No Did you need to seek medical attention at a hospital or doctor's office? No When did it last happen?      5-10 years ago If all above answers are "NO", may proceed with cephalosporin use.    Family History  Problem Relation Age of Onset   Liver disease Mother    Dementia Mother    Diabetes Mother        borderline   Depression Mother    Coronary artery disease Father    Heart attack Father    Hypertension Father    Heart disease  Father    Early death Father    Cancer Other        leukemia   Stroke Maternal Grandfather    Alcohol abuse Maternal Grandfather    Hyperlipidemia Brother        Amyloidosis   Cancer Brother    Hypertension Brother    Diabetes Maternal Grandmother    Cancer Paternal Uncle        unknown   Heart attack Paternal Grandmother    Heart attack Paternal Uncle    Hypertension Brother    Diabetes Daughter        borderline   Diabetes Sister    Heart disease Sister    Kidney disease Sister    Heart disease Brother    Colon cancer Neg Hx    Esophageal cancer Neg Hx    Rectal cancer Neg Hx    Stomach cancer Neg Hx    PE: BP 120/60   Pulse 95   Ht 5\' 2"  (1.575 m)   Wt 127 lb 9.6 oz (57.9 kg)   SpO2 97%   BMI 23.34 kg/m   Wt Readings from Last 3 Encounters:  10/23/23 127 lb 9.6 oz (57.9 kg)  09/13/23 131 lb 9.6 oz (59.7 kg)  06/24/23 133 lb 6.4 oz (60.5 kg)   Constitutional: Normal weight, in NAD Eyes: EOMI, no exophthalmos ENT:  + full thyroid  on palpation (L>R lobe), no cervical lymphadenopathy Cardiovascular: RRR, No MRG Respiratory: CTA B Musculoskeletal: no deformities Skin: no rashes; 0.8 cm ulcer on left forearm, without erythema, edema, or pain on palpation. Neurological: + tremor with outstretched hands (mostly in 4th L finger) Diabetic Foot Exam - Simple   Simple Foot Form Diabetic Foot exam was performed with the following findings: Yes 10/23/2023  9:30 AM  Visual Inspection No deformities, no ulcerations, no other skin breakdown bilaterally: Yes Sensation Testing Intact to touch and monofilament testing bilaterally: Yes Pulse Check Posterior Tibialis and Dorsalis pulse intact bilaterally: Yes Comments    ASSESSMENT: 1. DM2, insulin -dependent, uncontrolled, without long term complications, but with hyperglycemia  2. HL  3.  Multiple thyroid  nodules  PLAN:  1. Patient with longstanding, previously uncontrolled type 2 diabetes, with improved control in  the last 3 years, but the higher HbA1c at last visit (6.9%).  At that time, sugars were still increasing slightly after breakfast but more significantly after dinner.  They were dropping fairly abruptly after dinner and she had some low blood sugars overnight, in the 70s.  Sugars started to increase after approximately 7 AM.  She was not taking Prandin  before dinner, as recommended but after breakfast.  I advised her to move Prandin  approximately 15 to 20 minutes before a larger  dinner.  We also reduce Lantus  to avoid further blood sugar drops overnight.  We did not change the rest of the regimen. CGM interpretation: -At today's visit, we reviewed her CGM downloads: It appears that 87% of values are in target range (goal >70%), while 8% are higher than 180 (goal <25%), and 5% are lower than 70 (goal <4%).  The calculated average blood sugar is 124.  The projected HbA1c for the next 3 months (GMI) is 6.3%. -Reviewing the CGM trends, sugars appear to be significantly improved from last visit, now mostly fluctuating within target range, with only occasional hyperglycemic spikes, but also low blood sugars both during the day and also night.  Upon questioning, she is taking a higher dose of Lantus  compared to last visit, despite the advised to decrease the dose at that time.  Will go ahead and reduce this now.  Otherwise, we can continue the current regimen. - I suggested to:  Patient Instructions  Please continue: - Metformin  2000 mg with dinner - Repaglinide  (Prandin ) 1 mg before a dinner - Jardiance  25 mg daily before breakfast - Trulicity  3 mg weekly  Please decrease: - Lantus  12 units at night    Please return in 4 months.   - we checked her HbA1c: 6.5% (lower) - advised to check sugars at different times of the day - 4x a day, rotating check times - advised for yearly eye exams >> she is UTD - will recheck her ACR today - she has a 0.8 cm skin level ulcer without signs of inflammation or  infection.  We did discuss that she may need to see wound care to see if anything can be done to help with the reepithelialization.  She is planning to see PCP for this.  She is putting topical antibiotic on it but also alcohol and I advised her to stop alcohol, which can burn the tissue - return to clinic in 4 months  2. HL - Reviewed latest lipid panel from 02/2023: Fractions at goal: Lab Results  Component Value Date   CHOL 95 03/21/2023   HDL 45.00 03/21/2023   LDLCALC 28 03/21/2023   LDLDIRECT 137.9 02/04/2013   TRIG 110.0 03/21/2023   CHOLHDL 2 03/21/2023  -She continues on Crestor  10 mg daily without side effects  3.  Thyroid  nodules -Her left thyroid  lobe was enlarged on exam so we checked a thyroid  ultrasound in 01/2020.  She had several nodules, of which the left inferior nodule had an indication for biopsy.  A biopsy of this nodule was benign.  For the other 2 nodules a follow-up was indicated in 1 year.  He had another ultrasound in 01/2021 the nodule appeared to be either stable or decreased in size.  No imaging follow-up was indicated. - No neck compression symptoms or masses felt on palpation of her neck at today's visit - Latest TSH was normal: Lab Results  Component Value Date   TSH 0.80 03/21/2023  -We will continue to follow her clinically for now  Emilie Harden, MD PhD [facility] Endocr[facility]

## 2023-10-24 ENCOUNTER — Other Ambulatory Visit: Payer: Self-pay | Admitting: Internal Medicine

## 2023-10-24 ENCOUNTER — Encounter: Payer: Self-pay | Admitting: Internal Medicine

## 2023-10-24 DIAGNOSIS — Z794 Long term (current) use of insulin: Secondary | ICD-10-CM

## 2023-10-24 LAB — MICROALBUMIN / CREATININE URINE RATIO
Creatinine, Urine: 93 mg/dL (ref 20–275)
Microalb Creat Ratio: 29 mg/g{creat} (ref <30)
Microalb, Ur: 2.7 mg/dL

## 2023-11-12 ENCOUNTER — Other Ambulatory Visit: Payer: Self-pay | Admitting: Internal Medicine

## 2023-11-24 ENCOUNTER — Emergency Department (HOSPITAL_COMMUNITY)

## 2023-11-24 ENCOUNTER — Encounter (HOSPITAL_COMMUNITY): Payer: Self-pay | Admitting: Internal Medicine

## 2023-11-24 ENCOUNTER — Inpatient Hospital Stay (HOSPITAL_COMMUNITY)
Admission: EM | Admit: 2023-11-24 | Discharge: 2023-11-29 | DRG: 481 | Disposition: A | Discharge status: Home Health | Attending: Internal Medicine | Admitting: Internal Medicine

## 2023-11-24 ENCOUNTER — Other Ambulatory Visit: Payer: Self-pay

## 2023-11-24 DIAGNOSIS — S72002A Fracture of unspecified part of neck of left femur, initial encounter for closed fracture: Secondary | ICD-10-CM | POA: Diagnosis not present

## 2023-11-24 DIAGNOSIS — Z79811 Long term (current) use of aromatase inhibitors: Secondary | ICD-10-CM

## 2023-11-24 DIAGNOSIS — I110 Hypertensive heart disease with heart failure: Secondary | ICD-10-CM | POA: Diagnosis present

## 2023-11-24 DIAGNOSIS — Z9081 Acquired absence of spleen: Secondary | ICD-10-CM

## 2023-11-24 DIAGNOSIS — E785 Hyperlipidemia, unspecified: Secondary | ICD-10-CM | POA: Diagnosis not present

## 2023-11-24 DIAGNOSIS — S72145A Nondisplaced intertrochanteric fracture of left femur, initial encounter for closed fracture: Principal | ICD-10-CM | POA: Diagnosis present

## 2023-11-24 DIAGNOSIS — I5022 Chronic systolic (congestive) heart failure: Secondary | ICD-10-CM | POA: Diagnosis not present

## 2023-11-24 DIAGNOSIS — Z833 Family history of diabetes mellitus: Secondary | ICD-10-CM | POA: Diagnosis not present

## 2023-11-24 DIAGNOSIS — S76012A Strain of muscle, fascia and tendon of left hip, initial encounter: Secondary | ICD-10-CM | POA: Diagnosis not present

## 2023-11-24 DIAGNOSIS — N179 Acute kidney failure, unspecified: Secondary | ICD-10-CM | POA: Diagnosis not present

## 2023-11-24 DIAGNOSIS — Z841 Family history of disorders of kidney and ureter: Secondary | ICD-10-CM

## 2023-11-24 DIAGNOSIS — Z823 Family history of stroke: Secondary | ICD-10-CM

## 2023-11-24 DIAGNOSIS — E8721 Acute metabolic acidosis: Secondary | ICD-10-CM | POA: Diagnosis present

## 2023-11-24 DIAGNOSIS — M1612 Unilateral primary osteoarthritis, left hip: Secondary | ICD-10-CM | POA: Diagnosis present

## 2023-11-24 DIAGNOSIS — Z83438 Family history of other disorder of lipoprotein metabolism and other lipidemia: Secondary | ICD-10-CM

## 2023-11-24 DIAGNOSIS — Z7982 Long term (current) use of aspirin: Secondary | ICD-10-CM

## 2023-11-24 DIAGNOSIS — I447 Left bundle-branch block, unspecified: Secondary | ICD-10-CM | POA: Diagnosis not present

## 2023-11-24 DIAGNOSIS — K219 Gastro-esophageal reflux disease without esophagitis: Secondary | ICD-10-CM | POA: Diagnosis present

## 2023-11-24 DIAGNOSIS — D72829 Elevated white blood cell count, unspecified: Secondary | ICD-10-CM | POA: Diagnosis not present

## 2023-11-24 DIAGNOSIS — R609 Edema, unspecified: Secondary | ICD-10-CM | POA: Diagnosis not present

## 2023-11-24 DIAGNOSIS — M160 Bilateral primary osteoarthritis of hip: Secondary | ICD-10-CM | POA: Diagnosis not present

## 2023-11-24 DIAGNOSIS — Z79899 Other long term (current) drug therapy: Secondary | ICD-10-CM

## 2023-11-24 DIAGNOSIS — W19XXXA Unspecified fall, initial encounter: Secondary | ICD-10-CM | POA: Diagnosis present

## 2023-11-24 DIAGNOSIS — Z853 Personal history of malignant neoplasm of breast: Secondary | ICD-10-CM

## 2023-11-24 DIAGNOSIS — D75839 Thrombocytosis, unspecified: Secondary | ICD-10-CM | POA: Diagnosis not present

## 2023-11-24 DIAGNOSIS — I428 Other cardiomyopathies: Secondary | ICD-10-CM | POA: Diagnosis not present

## 2023-11-24 DIAGNOSIS — M47816 Spondylosis without myelopathy or radiculopathy, lumbar region: Secondary | ICD-10-CM | POA: Diagnosis not present

## 2023-11-24 DIAGNOSIS — E119 Type 2 diabetes mellitus without complications: Secondary | ICD-10-CM

## 2023-11-24 DIAGNOSIS — Z794 Long term (current) use of insulin: Secondary | ICD-10-CM

## 2023-11-24 DIAGNOSIS — E1165 Type 2 diabetes mellitus with hyperglycemia: Secondary | ICD-10-CM | POA: Diagnosis not present

## 2023-11-24 DIAGNOSIS — I10 Essential (primary) hypertension: Secondary | ICD-10-CM

## 2023-11-24 DIAGNOSIS — Z7984 Long term (current) use of oral hypoglycemic drugs: Secondary | ICD-10-CM

## 2023-11-24 DIAGNOSIS — S72142A Displaced intertrochanteric fracture of left femur, initial encounter for closed fracture: Secondary | ICD-10-CM | POA: Diagnosis not present

## 2023-11-24 DIAGNOSIS — I509 Heart failure, unspecified: Secondary | ICD-10-CM | POA: Diagnosis not present

## 2023-11-24 DIAGNOSIS — S72009A Fracture of unspecified part of neck of unspecified femur, initial encounter for closed fracture: Secondary | ICD-10-CM | POA: Diagnosis present

## 2023-11-24 DIAGNOSIS — Z806 Family history of leukemia: Secondary | ICD-10-CM

## 2023-11-24 DIAGNOSIS — D649 Anemia, unspecified: Secondary | ICD-10-CM | POA: Diagnosis not present

## 2023-11-24 DIAGNOSIS — Z880 Allergy status to penicillin: Secondary | ICD-10-CM

## 2023-11-24 DIAGNOSIS — Y929 Unspecified place or not applicable: Secondary | ICD-10-CM | POA: Diagnosis not present

## 2023-11-24 DIAGNOSIS — R600 Localized edema: Secondary | ICD-10-CM | POA: Diagnosis not present

## 2023-11-24 DIAGNOSIS — I959 Hypotension, unspecified: Secondary | ICD-10-CM | POA: Diagnosis not present

## 2023-11-24 DIAGNOSIS — M25552 Pain in left hip: Secondary | ICD-10-CM | POA: Diagnosis not present

## 2023-11-24 DIAGNOSIS — M7602 Gluteal tendinitis, left hip: Secondary | ICD-10-CM | POA: Diagnosis not present

## 2023-11-24 DIAGNOSIS — Z8249 Family history of ischemic heart disease and other diseases of the circulatory system: Secondary | ICD-10-CM | POA: Diagnosis not present

## 2023-11-24 DIAGNOSIS — Z888 Allergy status to other drugs, medicaments and biological substances status: Secondary | ICD-10-CM

## 2023-11-24 DIAGNOSIS — Z7985 Long-term (current) use of injectable non-insulin antidiabetic drugs: Secondary | ICD-10-CM

## 2023-11-24 DIAGNOSIS — M79604 Pain in right leg: Secondary | ICD-10-CM | POA: Diagnosis not present

## 2023-11-24 DIAGNOSIS — I878 Other specified disorders of veins: Secondary | ICD-10-CM | POA: Diagnosis not present

## 2023-11-24 LAB — CBC WITH DIFFERENTIAL/PLATELET
Abs Immature Granulocytes: 0.62 10*3/uL — ABNORMAL HIGH (ref 0.00–0.07)
Basophils Absolute: 0.1 10*3/uL (ref 0.0–0.1)
Basophils Relative: 0 %
Eosinophils Absolute: 0.1 10*3/uL (ref 0.0–0.5)
Eosinophils Relative: 0 %
HCT: 37.5 % (ref 36.0–46.0)
Hemoglobin: 11.6 g/dL — ABNORMAL LOW (ref 12.0–15.0)
Immature Granulocytes: 2 %
Lymphocytes Relative: 11 %
Lymphs Abs: 3.1 10*3/uL (ref 0.7–4.0)
MCH: 29.7 pg (ref 26.0–34.0)
MCHC: 30.9 g/dL (ref 30.0–36.0)
MCV: 95.9 fL (ref 80.0–100.0)
Monocytes Absolute: 1.8 10*3/uL — ABNORMAL HIGH (ref 0.1–1.0)
Monocytes Relative: 6 %
Neutro Abs: 23.4 10*3/uL — ABNORMAL HIGH (ref 1.7–7.7)
Neutrophils Relative %: 81 %
Platelets: 473 10*3/uL — ABNORMAL HIGH (ref 150–400)
RBC: 3.91 MIL/uL (ref 3.87–5.11)
RDW: 13.2 % (ref 11.5–15.5)
WBC: 29.1 10*3/uL — ABNORMAL HIGH (ref 4.0–10.5)
nRBC: 0 % (ref 0.0–0.2)

## 2023-11-24 LAB — COMPREHENSIVE METABOLIC PANEL WITH GFR
ALT: 15 U/L (ref 0–44)
AST: 20 U/L (ref 15–41)
Albumin: 4.1 g/dL (ref 3.5–5.0)
Alkaline Phosphatase: 60 U/L (ref 38–126)
Anion gap: 15 (ref 5–15)
BUN: 36 mg/dL — ABNORMAL HIGH (ref 8–23)
CO2: 18 mmol/L — ABNORMAL LOW (ref 22–32)
Calcium: 9.5 mg/dL (ref 8.9–10.3)
Chloride: 106 mmol/L (ref 98–111)
Creatinine, Ser: 1.23 mg/dL — ABNORMAL HIGH (ref 0.44–1.00)
GFR, Estimated: 46 mL/min — ABNORMAL LOW (ref >60)
Glucose, Bld: 139 mg/dL — ABNORMAL HIGH (ref 70–99)
Potassium: 3.8 mmol/L (ref 3.5–5.1)
Sodium: 139 mmol/L (ref 135–145)
Total Bilirubin: 0.8 mg/dL (ref 0.0–1.2)
Total Protein: 7 g/dL (ref 6.5–8.1)

## 2023-11-24 LAB — GLUCOSE, CAPILLARY: Glucose-Capillary: 127 mg/dL — ABNORMAL HIGH (ref 70–99)

## 2023-11-24 MED ORDER — ENSURE PLUS HIGH PROTEIN PO LIQD
237.0000 mL | Freq: Two times a day (BID) | ORAL | Status: DC
  Administered 2023-11-25 – 2023-11-29 (×4): 237 mL via ORAL

## 2023-11-24 MED ORDER — HYDROMORPHONE HCL 1 MG/ML IJ SOLN
1.0000 mg | Freq: Once | INTRAMUSCULAR | Status: AC
  Administered 2023-11-24: 1 mg via INTRAVENOUS
  Filled 2023-11-24 (×2): qty 1

## 2023-11-24 MED ORDER — SODIUM CHLORIDE 0.9 % IV BOLUS
1000.0000 mL | Freq: Once | INTRAVENOUS | Status: AC
  Administered 2023-11-24: 1000 mL via INTRAVENOUS

## 2023-11-24 MED ORDER — INSULIN ASPART 100 UNIT/ML IJ SOLN
0.0000 [IU] | Freq: Three times a day (TID) | INTRAMUSCULAR | Status: DC
  Administered 2023-11-25 – 2023-11-27 (×4): 1 [IU] via SUBCUTANEOUS
  Administered 2023-11-28: 2 [IU] via SUBCUTANEOUS
  Filled 2023-11-24: qty 0.06

## 2023-11-24 MED ORDER — HYDROMORPHONE HCL 1 MG/ML IJ SOLN
0.5000 mg | INTRAMUSCULAR | Status: DC | PRN
  Administered 2023-11-24 – 2023-11-26 (×7): 0.5 mg via INTRAVENOUS
  Filled 2023-11-24 (×5): qty 0.5
  Filled 2023-11-24: qty 1
  Filled 2023-11-24: qty 0.5

## 2023-11-24 NOTE — ED Notes (Addendum)
 ED TO INPATIENT HANDOFF REPORT  Name/Age/Gender Margaret Norman 73 y.o. female  Code Status Code Status History     Date Active Date Inactive Code Status Order ID Comments User Context   09/28/2019 0834 09/28/2019 1603 Full Code [id number]  Darlis Eisenmenger, MD Inpatient   04/15/2019 1456 04/16/2019 1526 Full Code [id number]  Oza Blumenthal, MD Inpatient       Home/SNF/Other Home  Chief Complaint Hip fracture [facility]) [S72.009A]  Level of Care/Admitting Diagnosis ED Disposition     ED Disposition  Admit   Condition  --   Comment  Hospital Area: [facility] [[id number]]  Level of Care: Med-Surg [16]  May admit patient to Arlin Benes or Mary[facility] Smalling if equivalent level of care is available:: Yes  Covid Evaluation: Asymptomatic - no recent exposure (last 10 days) testing not required  Diagnosis: Hip fracture [facility]) [[id number]]  Admitting Physician: Willadean Hark [[id number]]  Attending Physician: Willadean Hark [phone]  Certification:: I certify this patient will need inpatient services for at least 2 midnights  Expected Medical Readiness: 11/29/2023          Medical History Past Medical History:  Diagnosis Date   Allergy AMOXICILLAN   Arthritis    Chest pain    Nuclear, adenosine ,  December, 2013, low risk nuclear scan with small, moderate in intensity, fixed anteroseptal defect. This is possibly related to an LBBB versus small prior infarct. 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 No Did it involve sudden or severe rash/hives, skin peeling, or any reaction on the inside of your mouth or nose? No Did you need to seek medical attention at a hospital or doctor's office? No When did it last happen?      5-10 years ago If all above answers are "NO", may proceed with cephalosporin use.     IV Location/Drains/Wounds Patient Lines/Drains/Airways Status     Active Line/Drains/Airways     Name Placement date Placement time Site Days   Peripheral IV 11/24/23 20 G Anterior;Proximal;Right Forearm 11/24/23  --  Forearm  less than 1            Labs/Imaging Results for orders placed or performed during the hospital encounter of 11/24/23 (from the past 48 hours)  CBC with Differential     Status: Abnormal   Collection Time: 11/24/23  5:18 PM  Result Value Ref Range   WBC 29.1 (H) 4.0 - 10.5 K/uL   RBC 3.91 3.87 - 5.11 MIL/uL   Hemoglobin 11.6 (L) 12.0 - 15.0 g/dL   HCT 96.2 95.2 - 84.1 %   MCV 95.9 80.0 - 100.0 fL   MCH 29.7 26.0 - 34.0 pg   MCHC 30.9 30.0 - 36.0 g/dL   RDW 32.4 40.1 - 02.7 %   Platelets 473 (H)  150 - 400 K/uL   nRBC 0.0 0.0 - 0.2 %   Neutrophils Relative % 81 %   Neutro Abs 23.4 (H) 1.7 - 7.7 K/uL   Lymphocytes Relative 11 %   Lymphs Abs 3.1 0.7 - 4.0 K/uL   Monocytes Relative 6 %   Monocytes Absolute 1.8 (H) 0.1 - 1.0 K/uL   Eosinophils Relative 0 %   Eosinophils Absolute 0.1 0.0 - 0.5 K/uL   Basophils Relative 0 %   Basophils Absolute 0.1 0.0 - 0.1 K/uL   Immature Granulocytes 2 %   Abs Immature Granulocytes 0.62 (H) 0.00 - 0.07 K/uL    Comment: Performed at [facility], [address], [place], [place] [address]  Comprehensive metabolic panel     Status: Abnormal   Collection Time: 11/24/23  5:18 PM  Result Value Ref Range   Sodium 139 135 - 145 mmol/L    Potassium 3.8 3.5 - 5.1 mmol/L   Chloride 106 98 - 111 mmol/L   CO2 18 (L) 22 - 32 mmol/L   Glucose, Bld 139 (H) 70 - 99 mg/dL    Comment: Glucose reference range applies only to samples taken after fasting for at least 8 hours.   BUN 36 (H) 8 - 23 mg/dL   Creatinine, Ser 9.14 (H) 0.44 - 1.00 mg/dL   Calcium  9.5 8.9 - 10.3 mg/dL   Total Protein 7.0 6.5 - 8.1 g/dL   Albumin 4.1 3.5 - 5.0 g/dL   AST 20 15 - 41 U/L   ALT 15 0 - 44 U/L   Alkaline Phosphatase 60 38 - 126 U/L   Total Bilirubin 0.8 0.0 - 1.2 mg/dL   GFR, Estimated 46 (L) >60 mL/min    Comment: (NOTE) Calculated using the CKD-EPI Creatinine Equation (2021)    Anion gap 15 5 - 15    Comment: Performed at [facility], [address], [place], [place] [address]   *Note: Due to a large number of results and/or encounters for the requested time period, some results have not been displayed. A complete set of results can be found in Results Review.   MR HIP LEFT WO CONTRAST Result Date: 11/24/2023 CLINICAL DATA:  Left hip pain after fall. EXAM: MR OF THE LEFT HIP WITHOUT CONTRAST TECHNIQUE: Multiplanar, multisequence MR imaging was performed. No intravenous contrast was administered. COMPARISON:  Left hip radiographs dated 11/24/2023. FINDINGS: Soft tissue and Muscle: Muscle bulk is relatively symmetric bilaterally. Edema of the left quadratus femoris muscle and visualized left vastus intermedius muscle, most compatible with posttraumatic strain/contusion. Mild edema of the left adductor musculature.Hamstring tendon origins are intact.Partial-thickness undersurface tears of the left gluteus medius and minimus cuff insertion with tendinosis.Visualized intrapelvic contents are grossly unremarkable. Bones/Hip: Nondisplaced intertrochanteric fracture of the left proximal femur with associated edema. Fracture margins extend through the greater and lesser trochanters. Severe osteoarthritis of the left hip with joint space  narrowing, marginal osteophytosis and patchy subchondral marrow edema of the femoral head and acetabulum. Nondisplaced subchondral fracture of the left superior femoral head can not be entirely excluded. No hip joint effusion. Labrum appears grossly intact, although evaluation is limited due to lack of intra-articular fluid/contrast. Transverse ligaments and ligamentum teres are appear intact. Sacroiliac joints and pubic symphysis are anatomically aligned. Degenerative changes of the visualized lower lumbar spine. Moderate-to-severe osteoarthritis of the right hip. IMPRESSION: 1. Nondisplaced intertrochanteric fracture of the left proximal femur. 2. Severe osteoarthritis of the left hip, as above. Nondisplaced subchondral fracture  of the left superior femoral head can not be entirely excluded. No hip joint effusion. 3. Edema of the left quadratus femoris muscle and visualized left vastus intermedius muscle, most compatible with posttraumatic strain/contusion. 4. Partial-thickness undersurface tears of the left gluteus medius and minimus cuff insertion with tendinosis. Electronically Signed   By: Mannie Seek M.D.   On: 11/24/2023 19:20   DG Hip Unilat W or Wo Pelvis 2-3 Views Left Result Date: 11/24/2023 CLINICAL DATA:  Left hip pain after a fall. EXAM: DG HIP (WITH OR WITHOUT PELVIS) 2-3V LEFT COMPARISON:  04/18/2023 FINDINGS: Degenerative changes in the lumbar spine and hips. Left hip demonstrates near bone on bone appearance with sclerosis and prominent osteophyte formation on both sides of the joint. No evidence of acute fracture or dislocation of the left hip or pelvis. No focal bone lesion or bone destruction. SI joints and symphysis pubis are not displaced. Calcified phleboliths in the pelvis. IMPRESSION: Prominent degenerative changes in the left hip. No acute bony abnormalities. Electronically Signed   By: Boyce Byes M.D.   On: 11/24/2023 17:30    Pending Labs Unresulted Labs (From  admission, onward)    None       Vitals/Pain Today's Vitals   11/24/23 1857 11/24/23 1900 11/24/23 1930 11/24/23 2000  BP:  124/71 117/68   Pulse:  95 89   Resp:   18   Temp:   98.3 F (36.8 C)   TempSrc:      SpO2:  99% 92%   Weight:      Height:      PainSc: 8    2     Isolation Precautions No active isolations  Medications Medications  HYDROmorphone  (DILAUDID ) injection 0.5 mg (has no administration in time range)  insulin  aspart (novoLOG ) injection 0-6 Units (has no administration in time range)  HYDROmorphone  (DILAUDID ) injection 1 mg (1 mg Intravenous Given 11/24/23 1857)  sodium chloride  0.9 % bolus 1,000 mL (1,000 mLs Intravenous New Bag/Given 11/24/23 1959)    Mobility walks Pt ambulatory with no assist on a normal basis.Purewick in place.

## 2023-11-24 NOTE — ED Triage Notes (Signed)
 Patient bib [facility] for fall left hip pain. 75mcg fentanyl  and IV started.

## 2023-11-24 NOTE — ED Provider Notes (Signed)
 [place] [facility] DEPARTMENT AT [facility] Provider Note   CSN: [id number] Arrival date & time: 11/24/23  1629     History  Chief Complaint  Patient presents with   Fall    Margaret Norman is a 73 y.o. female.  Patient has a history of diabetes and hypertension.  She fell today on her left hip and has pain in that hip.  She was post to get that hip replaced but declined surgery at the time  The history is provided by the patient and medical records. No language interpreter was used.  Fall This is a new problem. The current episode started 1 to 2 hours ago. The problem occurs rarely. The problem has been resolved. Pertinent negatives include no chest pain, no abdominal pain and no headaches. Nothing aggravates the symptoms. Nothing relieves the symptoms. She has tried nothing for the symptoms.       Home Medications Prior to Admission medications   Medication Sig Start Date End Date Taking? Authorizing Provider  acetaminophen  (TYLENOL ) 500 MG tablet Take 1,000 mg by mouth every 6 (six) hours as needed for moderate pain.    [provider]  anastrozole  (ARIMIDEX ) 1 MG tablet Take 1 tablet (1 mg total) by mouth daily. 05/10/23   Gudena, Vinay, MD  aspirin 81 MG EC tablet Take 81 mg by mouth daily.    [provider]  buPROPion  (WELLBUTRIN  XL) 300 MG 24 hr tablet Take 1 tablet (300 mg total) by mouth every morning. 03/21/23   Adra Alanis, FNP  carvedilol  (COREG ) 12.5 MG tablet Take 1 tablet (12.5 mg total) by mouth 2 (two) times daily with a meal. 10/15/23   Milford, Arlice Bene, FNP  Cholecalciferol (VITAMIN D ) 50 MCG (2000 UT) tablet Take 2,000 Units by mouth daily.    [provider]  Continuous Blood Gluc Receiver (FREESTYLE LIBRE 2 READER) DEVI 1 each by Does not apply route daily. 01/17/22   Emilie Harden, MD  Continuous Glucose Sensor (FREESTYLE LIBRE 2 SENSOR) MISC APPLY AS DIRECTED EVERY 14 DAYS 05/03/23   Emilie Harden,  MD  doxycycline  (VIBRAMYCIN ) 100 MG capsule Take 1 capsule (100 mg total) by mouth 2 (two) times daily. 10/07/23   Buena Carmine, NP  Dulaglutide  (TRULICITY ) 3 MG/0.5ML SOAJ Inject 3 mg into the skin once a week. Patient taking differently: Inject 3 mg into the skin once a week. ON SATURDAYS 06/24/23   Emilie Harden, MD  EASY COMFORT PEN NEEDLES 32G X 4 MM MISC USE AS DIRECTED EVERY DAY 05/27/23   Emilie Harden, MD  ENTRESTO  24-26 MG TAKE 1 TABLET BY MOUTH TWICE DAILY 09/12/23   Darlis Eisenmenger, MD  fluticasone  (FLONASE ) 50 MCG/ACT nasal spray Place 2 sprays into both nostrils as needed for allergies or rhinitis.    [provider]  furosemide  (LASIX ) 40 MG tablet Take 1 tablet (40 mg total) by mouth as needed. For weight gain of 3ln in 24 hours or 5lb in a week 05/16/23 09/12/24  Darlis Eisenmenger, MD  JARDIANCE  25 MG TABS tablet TAKE 1 TABLET BY MOUTH DAILY 11/12/23   Emilie Harden, MD  LANTUS  SOLOSTAR 100 UNIT/ML Solostar Pen INJECT SUBCUTANEOUSLY 20 UNITS AT BEDTIME 10/25/23   Emilie Harden, MD  meclizine  (ANTIVERT ) 25 MG tablet Take 25 mg by mouth 3 (three) times daily as needed for dizziness.    [provider]  metFORMIN  (GLUCOPHAGE ) 1000 MG tablet TAKE 1 TABLET BY MOUTH TWICE DAILY WITH A MEAL  10/23/23   Emilie Harden, MD  omeprazole  (PRILOSEC) 40 MG capsule TAKE 1 CAPSULE BY MOUTH 30-60 MINUTES PRIOR TO BREAKFAST AND DINNER 09/10/23   Adra Alanis, FNP  Probiotic Product (PROBIOTIC ADVANCED PO) Take 1 tablet by mouth in the morning and at bedtime.    [provider]  repaglinide  (PRANDIN ) 1 MG tablet Take 1 tablet (1 mg total) by mouth daily before supper. 06/24/23   Emilie Harden, MD  rosuvastatin  (CRESTOR ) 10 MG tablet TAKE 1 TABLET BY MOUTH DAILY 08/13/23   Adra Alanis, FNP  sertraline  (ZOLOFT ) 100 MG tablet Take 1 tablet (100 mg total) by mouth daily. 03/21/23   Adra Alanis, FNP  spironolactone  (ALDACTONE ) 25  MG tablet TAKE 1 TABLET BY MOUTH DAILY 12/03/22   McLean, Dalton S, MD  Vitamin E 180 MG CAPS Take 180 mg by mouth at bedtime.     [provider]      Allergies    Invokana  [canagliflozin ] and Amoxicillin     Review of Systems   Review of Systems  Constitutional:  Negative for appetite change and fatigue.  HENT:  Negative for congestion, ear discharge and sinus pressure.   Eyes:  Negative for discharge.  Respiratory:  Negative for cough.   Cardiovascular:  Negative for chest pain.  Gastrointestinal:  Negative for abdominal pain and diarrhea.  Genitourinary:  Negative for frequency and hematuria.  Musculoskeletal:  Negative for back pain.       Painful left hip  Skin:  Negative for rash.  Neurological:  Negative for seizures and headaches.  Psychiatric/Behavioral:  Negative for hallucinations.     Physical Exam Updated Vital Signs BP 117/68   Pulse 89   Temp 98.3 F (36.8 C)   Resp 18   Ht 5\' 2"  (1.575 m)   Wt 60.3 kg   SpO2 92%   BMI 24.33 kg/m  Physical Exam Vitals and nursing note reviewed.  Constitutional:      Appearance: She is well-developed.  HENT:     Head: Normocephalic.     Nose: Nose normal.  Eyes:     General: No scleral icterus.    Conjunctiva/sclera: Conjunctivae normal.  Neck:     Thyroid : No thyromegaly.  Cardiovascular:     Rate and Rhythm: Normal rate and regular rhythm.     Heart sounds: No murmur heard.    No friction rub. No gallop.  Pulmonary:     Breath sounds: No stridor. No wheezing or rales.  Chest:     Chest wall: No tenderness.  Abdominal:     General: There is no distension.     Tenderness: There is no abdominal tenderness. There is no rebound.  Musculoskeletal:     Cervical back: Neck supple.     Comments: Tender left hip  Lymphadenopathy:     Cervical: No cervical adenopathy.  Skin:    Findings: No erythema or rash.  Neurological:     Mental Status: She is alert and oriented to person, place, and time.      Motor: No abnormal muscle tone.     Coordination: Coordination normal.  Psychiatric:        Behavior: Behavior normal.     ED Results / Procedures / Treatments   Labs (all labs ordered are listed, but only abnormal results are displayed) Labs Reviewed  CBC WITH DIFFERENTIAL/PLATELET - Abnormal; Notable for the following components:      Result Value   WBC 29.1 (*)    Hemoglobin  11.6 (*)    Platelets 473 (*)    Neutro Abs 23.4 (*)    Monocytes Absolute 1.8 (*)    Abs Immature Granulocytes 0.62 (*)    All other components within normal limits  COMPREHENSIVE METABOLIC PANEL WITH GFR - Abnormal; Notable for the following components:   CO2 18 (*)    Glucose, Bld 139 (*)    BUN 36 (*)    Creatinine, Ser 1.23 (*)    GFR, Estimated 46 (*)    All other components within normal limits    EKG None  Radiology MR HIP LEFT WO CONTRAST Result Date: 11/24/2023 CLINICAL DATA:  Left hip pain after fall. EXAM: MR OF THE LEFT HIP WITHOUT CONTRAST TECHNIQUE: Multiplanar, multisequence MR imaging was performed. No intravenous contrast was administered. COMPARISON:  Left hip radiographs dated 11/24/2023. FINDINGS: Soft tissue and Muscle: Muscle bulk is relatively symmetric bilaterally. Edema of the left quadratus femoris muscle and visualized left vastus intermedius muscle, most compatible with posttraumatic strain/contusion. Mild edema of the left adductor musculature.Hamstring tendon origins are intact.Partial-thickness undersurface tears of the left gluteus medius and minimus cuff insertion with tendinosis.Visualized intrapelvic contents are grossly unremarkable. Bones/Hip: Nondisplaced intertrochanteric fracture of the left proximal femur with associated edema. Fracture margins extend through the greater and lesser trochanters. Severe osteoarthritis of the left hip with joint space narrowing, marginal osteophytosis and patchy subchondral marrow edema of the femoral head and acetabulum. Nondisplaced  subchondral fracture of the left superior femoral head can not be entirely excluded. No hip joint effusion. Labrum appears grossly intact, although evaluation is limited due to lack of intra-articular fluid/contrast. Transverse ligaments and ligamentum teres are appear intact. Sacroiliac joints and pubic symphysis are anatomically aligned. Degenerative changes of the visualized lower lumbar spine. Moderate-to-severe osteoarthritis of the right hip. IMPRESSION: 1. Nondisplaced intertrochanteric fracture of the left proximal femur. 2. Severe osteoarthritis of the left hip, as above. Nondisplaced subchondral fracture of the left superior femoral head can not be entirely excluded. No hip joint effusion. 3. Edema of the left quadratus femoris muscle and visualized left vastus intermedius muscle, most compatible with posttraumatic strain/contusion. 4. Partial-thickness undersurface tears of the left gluteus medius and minimus cuff insertion with tendinosis. Electronically Signed   By: Mannie Seek M.D.   On: 11/24/2023 19:20   DG Hip Unilat W or Wo Pelvis 2-3 Views Left Result Date: 11/24/2023 CLINICAL DATA:  Left hip pain after a fall. EXAM: DG HIP (WITH OR WITHOUT PELVIS) 2-3V LEFT COMPARISON:  04/18/2023 FINDINGS: Degenerative changes in the lumbar spine and hips. Left hip demonstrates near bone on bone appearance with sclerosis and prominent osteophyte formation on both sides of the joint. No evidence of acute fracture or dislocation of the left hip or pelvis. No focal bone lesion or bone destruction. SI joints and symphysis pubis are not displaced. Calcified phleboliths in the pelvis. IMPRESSION: Prominent degenerative changes in the left hip. No acute bony abnormalities. Electronically Signed   By: Boyce Byes M.D.   On: 11/24/2023 17:30    Procedures Procedures    Medications Ordered in ED Medications  HYDROmorphone  (DILAUDID ) injection 1 mg (1 mg Intravenous Given 11/24/23 1857)  sodium chloride   0.9 % bolus 1,000 mL (1,000 mLs Intravenous New Bag/Given 11/24/23 1959)    ED Course/ Medical Decision Making/ A&P                                 Medical  Decision Making Amount and/or Complexity of Data Reviewed Labs: ordered. Radiology: ordered.  Risk Prescription drug management. Decision regarding hospitalization.   Fractured left hip.  Patient will be admitted to medicine        Final Clinical Impression(s) / ED Diagnoses Final diagnoses:  Closed fracture of left hip, initial encounter [facility])    Rx / DC Orders ED Discharge Orders     None         Cheyenne Cotta, MD 11/24/23 2118

## 2023-11-24 NOTE — H&P (Incomplete)
 History and Physical    Patient: Margaret Norman ZOX:[id number] DOB: February 05, 1951 DOA: 11/24/2023 DOS: the patient was seen and examined on 11/24/2023 PCP: Adra Alanis, FNP  Patient coming from: Home  Chief Complaint:  Chief Complaint  Patient presents with   Fall   HPI: Margaret Norman is a 73 y.o. female with medical history significant of ***     Review of Systems: {ROS_Text:26778} Past Medical History:  Diagnosis Date   Allergy AMOXICILLAN   Arthritis    Chest pain    Nuclear, adenosine ,  December, 2013, low risk nuclear scan with small, moderate in intensity, fixed anteroseptal defect. This is possibly related to an LBBB versus small prior infarct. 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   Type II or unspecified type diabetes mellitus without mention of complication, not stated as uncontrolled    Past Surgical History:  Procedure Laterality Date   APPENDECTOMY     BREAST BIOPSY Left 10/2017   BREAST BIOPSY Right 03/2019   BREAST BIOPSY Right 08/26/2023   US  RT BREAST BX W LOC DEV 1ST LESION IMG BX SPEC US  GUIDE 08/26/2023 [facility]   BREAST LUMPECTOMY Right 2020   BREAST LUMPECTOMY WITH RADIOACTIVE SEED AND SENTINEL LYMPH NODE BIOPSY Right 04/15/2019   Procedure: RIGHT BREAST PARTIAL MASTECTOMY WITH RADIOACTIVE SEED AND SENTINEL LYMPH NODE BIOPSY;  Surgeon: Oza Blumenthal, MD;  Location: [facility] OR;  Service: General;  Laterality: Right;   CESAREAN SECTION     x2 ? w/appy   CHOLECYSTECTOMY     ESOPHAGOGASTRODUODENOSCOPY     HERNIA REPAIR     KNEE ARTHROSCOPY Right 11/2005   LIVER BIOPSY     PANCREATIC CYST EXCISION     PORT-A-CATH REMOVAL Left 04/25/2020    Procedure: REMOVAL PORT-A-CATH;  Surgeon: Oza Blumenthal, MD;  Location: [facility];  Service: General;  Laterality: Left;   PORTACATH PLACEMENT Left 04/15/2019   Procedure: INSERTION PORT-A-CATH WITH ULTRASOUND;  Surgeon: Oza Blumenthal, MD;  Location: [facility] OR;  Service: General;  Laterality: Left;   RIGHT/LEFT HEART CATH AND CORONARY ANGIOGRAPHY N/A 09/28/2019   Procedure: RIGHT/LEFT HEART CATH AND CORONARY ANGIOGRAPHY;  Surgeon: Darlis Eisenmenger, MD;  Location: [facility];  Service: Cardiovascular;  Laterality: N/A;   SPLENECTOMY     TONSILLECTOMY AND ADENOIDECTOMY     TUBAL LIGATION  06/24/78   Social History:  reports that she has never smoked. She has never used smokeless tobacco. She reports that she does not drink alcohol and does not use drugs.  Allergies  Allergen Reactions   Invokana  [Canagliflozin ] Other (See Comments)    UTI's   Amoxicillin  Other (See Comments)    Sore tongue Did it involve swelling of the face/tongue/throat, SOB, or low BP? No Did it involve sudden or severe rash/hives, skin peeling, or any reaction on the inside of your mouth or nose? No Did you need to seek medical attention at a hospital or doctor's office? No When did it last happen?      5-10 years ago If all above answers are "NO", may proceed with cephalosporin use.     Family History  Problem Relation Age of Onset   Liver disease Mother    Dementia Mother    Diabetes Mother        borderline   Depression Mother    Coronary artery disease Father    Heart attack Father    Hypertension Father    Heart disease Father    Early death Father    Cancer Other        leukemia   Stroke Maternal Grandfather    Alcohol abuse Maternal Grandfather    Hyperlipidemia Brother        Amyloidosis   Cancer Brother    Hypertension Brother    Diabetes Maternal Grandmother    Cancer Paternal Uncle        unknown   Heart attack Paternal Grandmother    Heart attack Paternal  Uncle    Hypertension Brother    Diabetes Daughter        borderline   Diabetes Sister    Heart disease Sister    Kidney disease Sister    Heart disease Brother    Colon cancer Neg Hx    Esophageal cancer Neg Hx    Rectal cancer Neg Hx    Stomach cancer Neg Hx     Prior to Admission medications   Medication Sig Start Date End Date Taking? Authorizing Provider  acetaminophen  (TYLENOL ) 500 MG tablet Take 1,000 mg by mouth every 6 (six) hours as needed for moderate pain.    [provider]  anastrozole  (ARIMIDEX ) 1 MG tablet Take 1 tablet (1 mg total) by mouth daily. 05/10/23   Gudena, Vinay, MD  aspirin 81 MG EC tablet Take 81 mg by mouth daily.    [provider]  buPROPion  (WELLBUTRIN  XL) 300 MG 24 hr tablet Take 1 tablet (300 mg total) by mouth every morning. 03/21/23   Adra Alanis, FNP  carvedilol  (COREG ) 12.5 MG tablet Take 1 tablet (12.5 mg total) by mouth 2 (two) times daily with a meal. 10/15/23   Milford, Arlice Bene, FNP  Cholecalciferol (VITAMIN D ) 50 MCG (2000 UT) tablet Take 2,000 Units by mouth daily.    [provider]  Continuous Blood Gluc Receiver (FREESTYLE LIBRE 2 READER) DEVI 1 each by Does not apply route daily. 01/17/22   Emilie Harden, MD  Continuous Glucose Sensor (FREESTYLE LIBRE 2 SENSOR) MISC APPLY AS DIRECTED EVERY 14 DAYS 05/03/23   Emilie Harden, MD  doxycycline  (VIBRAMYCIN ) 100 MG capsule Take 1 capsule (100 mg total) by mouth 2 (two) times daily. 10/07/23   Buena Carmine, NP  Dulaglutide  (TRULICITY ) 3 MG/0.5ML SOAJ Inject 3 mg into the skin once a week. Patient taking differently: Inject 3 mg into the skin once a week. ON SATURDAYS 06/24/23  Yes Emilie Harden, MD  EASY COMFORT PEN NEEDLES 32G X 4 MM MISC USE AS DIRECTED EVERY DAY 05/27/23   Emilie Harden, MD  ENTRESTO  24-26 MG TAKE 1 TABLET BY MOUTH TWICE DAILY 09/12/23   McLean, Dalton S, MD  fluticasone  (FLONASE ) 50 MCG/ACT nasal spray Place 2 sprays into  both nostrils as needed for allergies or rhinitis.    [provider]  furosemide  (LASIX ) 40 MG tablet Take 1 tablet (40 mg total) by mouth as needed. For weight gain of 3ln in 24 hours or 5lb in a week 05/16/23 09/12/24  Darlis Eisenmenger, MD  JARDIANCE  25 MG TABS tablet TAKE 1 TABLET BY MOUTH DAILY 11/12/23   Emilie Harden, MD  LANTUS  SOLOSTAR 100 UNIT/ML Solostar  Pen INJECT SUBCUTANEOUSLY 20 UNITS AT BEDTIME 10/25/23   Emilie Harden, MD  meclizine  (ANTIVERT ) 25 MG tablet Take 25 mg by mouth 3 (three) times daily as needed for dizziness.    [provider]  metFORMIN  (GLUCOPHAGE ) 1000 MG tablet TAKE 1 TABLET BY MOUTH TWICE DAILY WITH A MEAL 10/23/23   Emilie Harden, MD  omeprazole  (PRILOSEC) 40 MG capsule TAKE 1 CAPSULE BY MOUTH 30-60 MINUTES PRIOR TO BREAKFAST AND DINNER 09/10/23   Adra Alanis, FNP  Probiotic Product (PROBIOTIC ADVANCED PO) Take 1 tablet by mouth in the morning and at bedtime.    [provider]  repaglinide  (PRANDIN ) 1 MG tablet Take 1 tablet (1 mg total) by mouth daily before supper. 06/24/23   Emilie Harden, MD  rosuvastatin  (CRESTOR ) 10 MG tablet TAKE 1 TABLET BY MOUTH DAILY 08/13/23   Adra Alanis, FNP  sertraline  (ZOLOFT ) 100 MG tablet Take 1 tablet (100 mg total) by mouth daily. 03/21/23   Adra Alanis, FNP  spironolactone  (ALDACTONE ) 25 MG tablet TAKE 1 TABLET BY MOUTH DAILY 12/03/22   McLean, Dalton S, MD  Vitamin E 180 MG CAPS Take 180 mg by mouth at bedtime.     [provider]    Physical Exam: Vitals:   11/24/23 1730 11/24/23 1800 11/24/23 1900 11/24/23 1930  BP: 118/66 122/64 124/71 117/68  Pulse: 88 90 95 89  Resp:    18  Temp:    98.3 F (36.8 C)  TempSrc:      SpO2: 94% 95% 99% 92%  Weight:      Height:       *** Data Reviewed: {Tip this will not be part of the note when signed- Document your independent interpretation of telemetry tracing, EKG, lab, Radiology test or any other  diagnostic tests. Add any new diagnostic test ordered today. (Optional):26781}   Assessment and Plan: No notes have been filed under this hospital service. Service: Hospitalist     Advance Care Planning:   Code Status: Prior   Consults:   Family Communication:   Severity of Illness: The appropriate patient status for this patient is INPATIENT. Inpatient status is judged to be reasonable and necessary in order to provide the required intensity of service to ensure the patient's safety. The patient's presenting symptoms, physical exam findings, and initial radiographic and laboratory data in the context of their chronic comorbidities is felt to place them at high risk for further clinical deterioration. Furthermore, it is not anticipated that the patient will be medically stable for discharge from the hospital within 2 midnights of admission.   * I certify that at the point of admission it is my clinical judgment that the patient will require inpatient hospital care spanning beyond 2 midnights from the point of admission due to high intensity of service, high risk for further deterioration and high frequency of surveillance required.*  Author: Willadean Hark, MD 11/24/2023 9:24 PM  For on call review [email or web address].

## 2023-11-24 NOTE — Plan of Care (Signed)

## 2023-11-25 ENCOUNTER — Inpatient Hospital Stay (HOSPITAL_COMMUNITY)

## 2023-11-25 ENCOUNTER — Encounter (HOSPITAL_COMMUNITY): Payer: Self-pay | Admitting: Internal Medicine

## 2023-11-25 DIAGNOSIS — S72002A Fracture of unspecified part of neck of left femur, initial encounter for closed fracture: Secondary | ICD-10-CM | POA: Diagnosis not present

## 2023-11-25 LAB — GLUCOSE, CAPILLARY
Glucose-Capillary: 135 mg/dL — ABNORMAL HIGH (ref 70–99)
Glucose-Capillary: 125 mg/dL — ABNORMAL HIGH (ref 70–99)
Glucose-Capillary: 178 mg/dL — ABNORMAL HIGH (ref 70–99)
Glucose-Capillary: 194 mg/dL — ABNORMAL HIGH (ref 70–99)

## 2023-11-25 LAB — SURGICAL PCR SCREEN
MRSA, PCR: NEGATIVE
Staphylococcus aureus: NEGATIVE

## 2023-11-25 LAB — URINALYSIS, W/ REFLEX TO CULTURE (INFECTION SUSPECTED)
Bacteria, UA: NONE SEEN
Bilirubin Urine: NEGATIVE
Glucose, UA: >=500 mg/dL — AB
Ketones, ur: 5 mg/dL — AB
Nitrite: NEGATIVE
Protein, ur: NEGATIVE mg/dL
Specific Gravity, Urine: 1.018 (ref 1.005–1.030)
pH: 5 (ref 5.0–8.0)

## 2023-11-25 LAB — CBC
HCT: 34.7 % — ABNORMAL LOW (ref 36.0–46.0)
Hemoglobin: 11 g/dL — ABNORMAL LOW (ref 12.0–15.0)
MCH: 29.7 pg (ref 26.0–34.0)
MCHC: 31.7 g/dL (ref 30.0–36.0)
MCV: 93.8 fL (ref 80.0–100.0)
Platelets: 410 10*3/uL — ABNORMAL HIGH (ref 150–400)
RBC: 3.7 MIL/uL — ABNORMAL LOW (ref 3.87–5.11)
RDW: 13 % (ref 11.5–15.5)
WBC: 22.2 10*3/uL — ABNORMAL HIGH (ref 4.0–10.5)
nRBC: 0 % (ref 0.0–0.2)

## 2023-11-25 LAB — CREATININE, SERUM
Creatinine, Ser: 0.78 mg/dL (ref 0.44–1.00)
GFR, Estimated: >60 mL/min (ref >60)

## 2023-11-25 MED ORDER — CHLORHEXIDINE GLUCONATE CLOTH 2 % EX PADS
6.0000 | MEDICATED_PAD | Freq: Every day | CUTANEOUS | Status: DC
  Administered 2023-11-25 – 2023-11-28 (×4): 6 via TOPICAL

## 2023-11-25 MED ORDER — LACTATED RINGERS IV BOLUS: 1000.0000 mL | Freq: Once | INTRAVENOUS | Status: DC

## 2023-11-25 MED ORDER — CARVEDILOL 12.5 MG PO TABS
12.5000 mg | ORAL_TABLET | Freq: Two times a day (BID) | ORAL | Status: DC
  Administered 2023-11-25 – 2023-11-26 (×3): 12.5 mg via ORAL
  Filled 2023-11-25 (×3): qty 1

## 2023-11-25 MED ORDER — ENOXAPARIN SODIUM 40 MG/0.4ML IJ SOSY
40.0000 mg | PREFILLED_SYRINGE | INTRAMUSCULAR | Status: AC
  Administered 2023-11-26: 40 mg via SUBCUTANEOUS
  Filled 2023-11-25: qty 0.4

## 2023-11-25 MED ORDER — DOCUSATE SODIUM 100 MG PO CAPS
200.0000 mg | ORAL_CAPSULE | Freq: Every day | ORAL | Status: DC
  Administered 2023-11-25 – 2023-11-26 (×3): 200 mg via ORAL
  Filled 2023-11-25 (×3): qty 2

## 2023-11-25 MED ORDER — ENOXAPARIN SODIUM 40 MG/0.4ML IJ SOSY
40.0000 mg | PREFILLED_SYRINGE | INTRAMUSCULAR | Status: DC
Start: 2023-11-25 — End: 2023-11-25
  Administered 2023-11-25: 40 mg via SUBCUTANEOUS
  Filled 2023-11-25: qty 0.4

## 2023-11-25 MED ORDER — ANASTROZOLE 1 MG PO TABS
1.0000 mg | ORAL_TABLET | Freq: Every day | ORAL | Status: DC
  Administered 2023-11-25 – 2023-11-29 (×4): 1 mg via ORAL
  Filled 2023-11-25 (×5): qty 1

## 2023-11-25 MED ORDER — INSULIN GLARGINE-YFGN 100 UNIT/ML ~~LOC~~ SOLN
10.0000 [IU] | Freq: Every day | SUBCUTANEOUS | Status: DC
  Administered 2023-11-25 – 2023-11-27 (×3): 10 [IU] via SUBCUTANEOUS
  Filled 2023-11-25 (×6): qty 0.1

## 2023-11-25 MED ORDER — PANTOPRAZOLE SODIUM 40 MG PO TBEC
40.0000 mg | DELAYED_RELEASE_TABLET | Freq: Every day | ORAL | Status: DC
  Administered 2023-11-25 – 2023-11-26 (×2): 40 mg via ORAL
  Filled 2023-11-25 (×2): qty 1

## 2023-11-25 MED ORDER — BUPROPION HCL ER (XL) 150 MG PO TB24
300.0000 mg | ORAL_TABLET | Freq: Every morning | ORAL | Status: DC
  Administered 2023-11-25 – 2023-11-29 (×4): 300 mg via ORAL
  Filled 2023-11-25: qty 1
  Filled 2023-11-25: qty 2
  Filled 2023-11-25: qty 1
  Filled 2023-11-25: qty 2

## 2023-11-25 MED ORDER — VITAMIN D 25 MCG (1000 UNIT) PO TABS
2000.0000 [IU] | ORAL_TABLET | Freq: Every day | ORAL | Status: DC
  Administered 2023-11-25 – 2023-11-29 (×4): 2000 [IU] via ORAL
  Filled 2023-11-25 (×4): qty 2

## 2023-11-25 MED ORDER — INSULIN GLARGINE 100 UNIT/ML ~~LOC~~ SOLN
5.0000 [IU] | Freq: Every day | SUBCUTANEOUS | Status: DC
  Filled 2023-11-25: qty 0.05

## 2023-11-25 MED ORDER — LACTATED RINGERS IV BOLUS
500.0000 mL | Freq: Once | INTRAVENOUS | Status: AC
  Administered 2023-11-25: 500 mL via INTRAVENOUS

## 2023-11-25 MED ORDER — INSULIN GLARGINE-YFGN 100 UNIT/ML ~~LOC~~ SOLN
5.0000 [IU] | Freq: Every day | SUBCUTANEOUS | Status: DC
  Administered 2023-11-25: 5 [IU] via SUBCUTANEOUS
  Filled 2023-11-25 (×2): qty 0.05

## 2023-11-25 MED ORDER — SERTRALINE HCL 100 MG PO TABS
100.0000 mg | ORAL_TABLET | Freq: Every day | ORAL | Status: DC
  Administered 2023-11-25 – 2023-11-29 (×4): 100 mg via ORAL
  Filled 2023-11-25 (×4): qty 1

## 2023-11-25 MED ORDER — LACTATED RINGERS IV BOLUS
1000.0000 mL | Freq: Once | INTRAVENOUS | Status: AC
  Administered 2023-11-25: 1000 mL via INTRAVENOUS

## 2023-11-25 NOTE — Progress Notes (Signed)
  Progress Note   Patient: Margaret Norman ZOX:[id number] DOB: Jun 08, 1951 DOA: 11/24/2023     1 DOS: the patient was seen and examined on 11/25/2023   Brief hospital course: 73 y.o. 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  The patient was already planning to get a left hip replacement w Dr Christiane Cowing. The patient also has been having trouble with gait imbalance and some shakiness.  She also has had poor appetite.  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  Physical therapy has been recommended but they recommended that that wait until after her hip replacement given her hip pain. A lump in her breast was found last month she just had the biopsy that came back benign. In the ED the patient's workup revealed a proximal left femur fracture.  Dr. Julio Ohm of orthopedic surgery was consulted.  Assessment and Plan: Left proximal femur fracture - Orthopedic Surgery following -Recs noted for surgery on Wed at [facility] with non-urgent transfer recommended - cont analgesia as needed   2.DMT2  - Cont SSI as needed - will continue Lantus  10 u qhs   3.  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  She may be dry. - Continue beta blocker perioperatively - She follows with [facility] cardiology, denies chest pains - Holding Entresto  perioperatively for now given elevated Cr    4. Leukocytosis - white blood cell count = 29 She has had a chronically elevated white blood cell count since 2021, but this is the highest that it has been.     Subjective: Without complaints when seen  Physical Exam: Vitals:   11/25/23 0146 11/25/23 0545 11/25/23 1031 11/25/23 1643  BP: 113/66 123/62 129/67 (!) 105/54  Pulse: 89 84 85 87   Resp: 16 16 16 16   Temp: 97.6 F (36.4 C) 98 F (36.7 C) 98.3 F (36.8 C) 98.5 F (36.9 C)  TempSrc: Oral Oral    SpO2: 97% 97% 94% 95%  Weight:      Height:       General exam: Awake, laying in bed, in nad Respiratory system: Normal respiratory effort, no wheezing Cardiovascular system: regular rate, s1, s2 Gastrointestinal system: Soft, nondistended, positive BS Central nervous system: CN2-12 grossly intact, strength intact Extremities: Perfused, no clubbing Skin: Normal skin turgor, no notable skin lesions seen Psychiatry: Mood normal // no visual hallucinations   Data Reviewed:  Labs reviewed: WBC 22.2, hgb 11.0, Plts 410  Family Communication: Pt in room, family not at bedside  Disposition: Status is: Inpatient Remains inpatient appropriate because: severity of illness  Planned Discharge Destination: pending PT/OT eval post-op    Author: Cherylle Corwin, MD 11/25/2023 5:20 PM  For on call review [email or web address].

## 2023-11-25 NOTE — Hospital Course (Signed)
 73 y.o. 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  The patient was already planning to get a left hip replacement w Dr Christiane Cowing. The patient also has been having trouble with gait imbalance and some shakiness.  She also has had poor appetite.  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  Physical therapy has been recommended but they recommended that that wait until after her hip replacement given her hip pain. A lump in her breast was found last month she just had the biopsy that came back benign. In the ED the patient's workup revealed a proximal left femur fracture.  Dr. Julio Ohm of orthopedic surgery was consulted.

## 2023-11-25 NOTE — Plan of Care (Signed)

## 2023-11-25 NOTE — Consult Note (Signed)
 ORTHOPAEDIC CONSULTATION  REQUESTING PHYSICIAN: Oral Billings, MD  Chief Complaint: Left intertrochanteric femur fracture  HPI: Margaret Norman is a 73 y.o. 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  The patient was already planning to get a left hip replacement with me. The patient also has been having trouble with gait imbalance and some shakiness.  She also has had poor appetite.  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  Physical therapy has been recommended but they recommended that that wait until after her hip replacement given her hip pain.  Ortho consulted for surgical evaluation.  Past Medical History:  Diagnosis Date   Allergy AMOXICILLAN   Arthritis    Chest pain    Nuclear, adenosine ,  December, 2013, low risk nuclear scan with small, moderate in intensity, fixed anteroseptal defect. This is possibly related to an LBBB versus small prior infarct. 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   Type II or unspecified type diabetes mellitus without mention of complication, not stated as uncontrolled    Past Surgical History:  Procedure Laterality Date   APPENDECTOMY     BREAST BIOPSY Left 10/2017   BREAST BIOPSY Right 03/2019   BREAST BIOPSY Right 08/26/2023   US  RT BREAST BX W LOC DEV 1ST LESION IMG BX SPEC US  GUIDE 08/26/2023 [facility] MAMMOGRAPHY   BREAST LUMPECTOMY Right 2020   BREAST LUMPECTOMY WITH RADIOACTIVE SEED  AND SENTINEL LYMPH NODE BIOPSY Right 04/15/2019   Procedure: RIGHT BREAST PARTIAL MASTECTOMY WITH RADIOACTIVE SEED AND SENTINEL LYMPH NODE BIOPSY;  Surgeon: Oza Blumenthal, MD;  Location: [facility];  Service: General;  Laterality: Right;   CESAREAN SECTION     x2 ? w/appy   CHOLECYSTECTOMY     ESOPHAGOGASTRODUODENOSCOPY     HERNIA REPAIR     KNEE ARTHROSCOPY Right 11/2005   LIVER BIOPSY     PANCREATIC CYST EXCISION     PORT-A-CATH REMOVAL Left 04/25/2020   Procedure: REMOVAL PORT-A-CATH;  Surgeon: Oza Blumenthal, MD;  Location: [facility];  Service: General;  Laterality: Left;   PORTACATH PLACEMENT Left 04/15/2019   Procedure: INSERTION PORT-A-CATH WITH ULTRASOUND;  Surgeon: Oza Blumenthal, MD;  Location: [facility] OR;  Service: General;  Laterality: Left;   RIGHT/LEFT HEART CATH AND CORONARY ANGIOGRAPHY N/A 09/28/2019   Procedure: RIGHT/LEFT HEART CATH AND CORONARY ANGIOGRAPHY;  Surgeon: Darlis Eisenmenger, MD;  Location: [facility];  Service: Cardiovascular;  Laterality: N/A;   SPLENECTOMY     TONSILLECTOMY AND ADENOIDECTOMY     TUBAL LIGATION  06/24/78   Social History   Socioeconomic History   Marital status: Married    Spouse name: Mckinleigh Schuchart   Number of children: 2   Years of education: Not on file   Highest education level: 12th grade  Occupational History   Occupation: CSR    Employer: [employer]   Occupation: retired  Tobacco Use  Smoking status: Never   Smokeless tobacco: Never  Vaping Use   Vaping status: Never Used  Substance and Sexual Activity   Alcohol use: No   Drug use: No   Sexual activity: Not Currently    Birth control/protection: Pill    Comment: Tubes tied  Other Topics Concern   Not on file  Social History Narrative   Regular Exercise -  NO   Are you right handed or left handed? Right Handed    Are you currently employed ? No    What is your current occupation? Retired   Do you live at home alone? No   Who lives  with you? Husband, Daughter, and Dietra Fraction   What type of home do you live in: 1 story or 2 story? Lives in a one story home       Social Drivers of Health   Financial Resource Strain: Low Risk  (04/26/2023)   Overall Financial Resource Strain (CARDIA)    Difficulty of Paying Living Expenses: Not very hard  Food Insecurity: No Food Insecurity (11/24/2023)   Hunger Vital Sign    Worried About Running Out of Food in the Last Year: Never true    Ran Out of Food in the Last Year: Never true  Transportation Needs: No Transportation Needs (11/24/2023)   PRAPARE - Administrator, Civil Service (Medical): No    Lack of Transportation (Non-Medical): No  Physical Activity: Inactive (04/26/2023)   Exercise Vital Sign    Days of Exercise per Week: 0 days    Minutes of Exercise per Session: 10 min  Stress: Stress Concern Present (04/26/2023)   [employer] of Occupational Health - Occupational Stress Questionnaire    Feeling of Stress : To some extent  Social Connections: Moderately Isolated (11/24/2023)   Social Connection and Isolation Panel [NHANES]    Frequency of Communication with Friends and Family: More than three times a week    Frequency of Social Gatherings with Friends and Family: Three times a week    Attends Religious Services: Never    Active Member of Clubs or Organizations: No    Attends Engineer, structural: Never    Marital Status: Married   Family History  Problem Relation Age of Onset   Liver disease Mother    Dementia Mother    Diabetes Mother        borderline   Depression Mother    Coronary artery disease Father    Heart attack Father    Hypertension Father    Heart disease Father    Early death Father    Cancer Other        leukemia   Stroke Maternal Grandfather    Alcohol abuse Maternal Grandfather    Hyperlipidemia Brother        Amyloidosis   Cancer Brother    Hypertension Brother    Diabetes Maternal Grandmother    Cancer Paternal  Uncle        unknown   Heart attack Paternal Grandmother    Heart attack Paternal Uncle    Hypertension Brother    Diabetes Daughter        borderline   Diabetes Sister    Heart disease Sister    Kidney disease Sister    Heart disease Brother    Colon cancer Neg Hx    Esophageal cancer Neg Hx    Rectal cancer Neg Hx    Stomach cancer Neg Hx    Allergies  Allergen Reactions   Invokana  [Canagliflozin ] Other (See Comments)    UTI's   Amoxicillin  Other (See Comments)    Sore tongue Did it involve swelling of the face/tongue/throat, SOB, or low BP? No Did it involve sudden or severe rash/hives, skin peeling, or any reaction on the inside of your mouth or nose? No Did you need to seek medical attention at a hospital or doctor's office? No When did it last happen?      5-10 years ago If all above answers are "NO", may proceed with cephalosporin use.    Prior to Admission medications   Medication Sig Start Date End Date Taking? Authorizing Provider  acetaminophen  (TYLENOL ) 500 MG tablet Take 1,000 mg by mouth every 6 (six) hours as needed for moderate pain.   Yes [provider]  anastrozole  (ARIMIDEX ) 1 MG tablet Take 1 tablet (1 mg total) by mouth daily. 05/10/23  Yes Gudena, Vinay, MD  aspirin 81 MG EC tablet Take 81 mg by mouth daily.   Yes [provider]  B Complex-C (SUPER B COMPLEX PO) Take 1 tablet by mouth daily.   Yes [provider]  buPROPion  (WELLBUTRIN  XL) 300 MG 24 hr tablet Take 1 tablet (300 mg total) by mouth every morning. 03/21/23  Yes Adra Alanis, FNP  carvedilol  (COREG ) 12.5 MG tablet Take 1 tablet (12.5 mg total) by mouth 2 (two) times daily with a meal. 10/15/23  Yes Milford, Arlice Bene, FNP  Cholecalciferol (VITAMIN D ) 50 MCG (2000 UT) tablet Take 2,000 Units by mouth daily.   Yes [provider]  Dulaglutide  (TRULICITY ) 3 MG/0.5ML SOAJ Inject 3 mg into the skin once a week. Patient taking differently: Inject 3 mg  into the skin once a week. ON SATURDAYS 06/24/23  Yes Emilie Harden, MD  ENTRESTO  24-26 MG TAKE 1 TABLET BY MOUTH TWICE DAILY 09/12/23  Yes Darlis Eisenmenger, MD  furosemide  (LASIX ) 40 MG tablet Take 1 tablet (40 mg total) by mouth as needed. For weight gain of 3ln in 24 hours or 5lb in a week 05/16/23 09/12/24 Yes Darlis Eisenmenger, MD  JARDIANCE  25 MG TABS tablet TAKE 1 TABLET BY MOUTH DAILY 11/12/23  Yes Emilie Harden, MD  LANTUS  SOLOSTAR 100 UNIT/ML Solostar Pen INJECT SUBCUTANEOUSLY 20 UNITS AT BEDTIME 10/25/23  Yes Emilie Harden, MD  meclizine  (ANTIVERT ) 25 MG tablet Take 25 mg by mouth 3 (three) times daily as needed for dizziness.   Yes [provider]  metFORMIN  (GLUCOPHAGE ) 1000 MG tablet TAKE 1 TABLET BY MOUTH TWICE DAILY WITH A MEAL 10/23/23  Yes Emilie Harden, MD  omeprazole  (PRILOSEC) 40 MG capsule TAKE 1 CAPSULE BY MOUTH 30-60 MINUTES PRIOR TO BREAKFAST AND DINNER 09/10/23  Yes Adra Alanis, FNP  Probiotic Product (PROBIOTIC ADVANCED PO) Take 1 tablet by mouth in the morning and at bedtime.   Yes [provider]  repaglinide  (PRANDIN ) 1 MG tablet Take 1 tablet (1 mg total) by mouth daily before supper. 06/24/23  Yes Emilie Harden, MD  rosuvastatin  (CRESTOR ) 10 MG tablet TAKE 1 TABLET BY MOUTH DAILY 08/13/23  Yes Adra Alanis, FNP  sertraline  (ZOLOFT ) 100 MG tablet Take 1 tablet (100 mg total) by mouth daily. 03/21/23  Yes Adra Alanis, FNP  spironolactone  (ALDACTONE ) 25 MG tablet TAKE 1 TABLET BY MOUTH DAILY 12/03/22  Yes McLean, Dalton S, MD  Vitamin E 180 MG CAPS Take 180 mg by mouth at bedtime.    Yes [provider]  zinc  gluconate  50 MG tablet Take 50 mg by mouth daily.   Yes [provider]  Continuous Blood Gluc Receiver (FREESTYLE LIBRE 2 READER) DEVI 1 each by Does not apply route daily. 01/17/22   Emilie Harden, MD  Continuous Glucose Sensor (FREESTYLE LIBRE 2 SENSOR) MISC APPLY AS DIRECTED EVERY 14  DAYS 05/03/23   Emilie Harden, MD  EASY COMFORT PEN NEEDLES 32G X 4 MM MISC USE AS DIRECTED EVERY DAY 05/27/23   Emilie Harden, MD   MR HIP LEFT WO CONTRAST Result Date: 11/24/2023 CLINICAL DATA:  Left hip pain after fall. EXAM: MR OF THE LEFT HIP WITHOUT CONTRAST TECHNIQUE: Multiplanar, multisequence MR imaging was performed. No intravenous contrast was administered. COMPARISON:  Left hip radiographs dated 11/24/2023. FINDINGS: Soft tissue and Muscle: Muscle bulk is relatively symmetric bilaterally. Edema of the left quadratus femoris muscle and visualized left vastus intermedius muscle, most compatible with posttraumatic strain/contusion. Mild edema of the left adductor musculature.Hamstring tendon origins are intact.Partial-thickness undersurface tears of the left gluteus medius and minimus cuff insertion with tendinosis.Visualized intrapelvic contents are grossly unremarkable. Bones/Hip: Nondisplaced intertrochanteric fracture of the left proximal femur with associated edema. Fracture margins extend through the greater and lesser trochanters. Severe osteoarthritis of the left hip with joint space narrowing, marginal osteophytosis and patchy subchondral marrow edema of the femoral head and acetabulum. Nondisplaced subchondral fracture of the left superior femoral head can not be entirely excluded. No hip joint effusion. Labrum appears grossly intact, although evaluation is limited due to lack of intra-articular fluid/contrast. Transverse ligaments and ligamentum teres are appear intact. Sacroiliac joints and pubic symphysis are anatomically aligned. Degenerative changes of the visualized lower lumbar spine. Moderate-to-severe osteoarthritis of the right hip. IMPRESSION: 1. Nondisplaced intertrochanteric fracture of the left proximal femur. 2. Severe osteoarthritis of the left hip, as above. Nondisplaced subchondral fracture of the left superior femoral head can not be entirely excluded. No hip joint  effusion. 3. Edema of the left quadratus femoris muscle and visualized left vastus intermedius muscle, most compatible with posttraumatic strain/contusion. 4. Partial-thickness undersurface tears of the left gluteus medius and minimus cuff insertion with tendinosis. Electronically Signed   By: Mannie Seek M.D.   On: 11/24/2023 19:20   DG Hip Unilat W or Wo Pelvis 2-3 Views Left Result Date: 11/24/2023 CLINICAL DATA:  Left hip pain after a fall. EXAM: DG HIP (WITH OR WITHOUT PELVIS) 2-3V LEFT COMPARISON:  04/18/2023 FINDINGS: Degenerative changes in the lumbar spine and hips. Left hip demonstrates near bone on bone appearance with sclerosis and prominent osteophyte formation on both sides of the joint. No evidence of acute fracture or dislocation of the left hip or pelvis. No focal bone lesion or bone destruction. SI joints and symphysis pubis are not displaced. Calcified phleboliths in the pelvis. IMPRESSION: Prominent degenerative changes in the left hip. No acute bony abnormalities. Electronically Signed   By: Boyce Byes M.D.   On: 11/24/2023 17:30    All pertinent xrays, MRI, CT independently reviewed and interpreted  Positive ROS: All other systems have been reviewed and were otherwise negative with the exception of those mentioned in the HPI and as above.  Physical Exam: General: No acute distress Cardiovascular: No pedal edema Respiratory: No cyanosis, no use of accessory musculature GI: No organomegaly, abdomen is soft and non-tender Skin: No lesions in the area of chief complaint Neurologic: Sensation intact distally Psychiatric: Patient is at baseline mood and affect Lymphatic: No axillary or cervical lymphadenopathy  MUSCULOSKELETAL:  - pain with movement of  the left hip and extremity - skin intact - NVI distally - compartments soft  Assessment: Left nondisplaced intertrochanteric femur fracture  Plan: - MRI confirms nondisplaced IT fracture, plain xrays were  negative - due to nature and location of fracture, unfortunately, cannot perform THA to address both the fx and pre-existing DJD - will plan for ORIF with FNS in order to stabilize fracture, allow early mobilization and weight bearing as well as leaving the troch and abductors undisrupted for eventual THA - patient and family at bedside all in agreement with the plan - surgery is schedule for wed afternoon at [place] - please arrange nonurgent transfer to [facility] hospital - will order 1 dose of lovenox   Thank you for the consult and the opportunity to see Ms. Hendrick Locke Claria Crofts, MD [facility] 2:09 PM

## 2023-11-25 NOTE — TOC Initial Note (Signed)
 Transition of Care [facility]) - Initial/Assessment Note    Patient Details  Name: Margaret Norman MRN: [id number] Date of Birth: [birth year]  [facility]) CM/SW Contact:    Amaryllis Junior, LCSW Phone Number: 11/25/2023, 12:36 PM  Clinical Narrative:                 Pt from home with spouse and children. Pt continues medical workup. TOC following for dc needs.    Barriers to Discharge: Continued Medical Work up   Patient Goals and CMS Choice Patient states their goals for this hospitalization and ongoing recovery are:: return home   Choice offered to / list presented to : NA      Expected Discharge Plan and Services In-house Referral: NA Discharge Planning Services: NA   Living arrangements for the past 2 months: Single Family Home                 DME Arranged: N/A DME Agency: NA       HH Arranged: NA HH Agency: NA        Prior Living Arrangements/Services Living arrangements for the past 2 months: Single Family Home Lives with:: Spouse, Adult Children Patient language and need for interpreter reviewed:: Yes Do you feel safe going back to the place where you live?: Yes      Need for Family Participation in Patient Care: Yes (Comment) Care giver support system in place?: Yes (comment)   Criminal Activity/Legal Involvement Pertinent to Current Situation/Hospitalization: No - Comment as needed  Activities of Daily Living   ADL Screening (condition at time of admission) Independently performs ADLs?: Yes (appropriate for developmental age) Is the patient deaf or have difficulty hearing?: No Does the patient have difficulty seeing, even when wearing glasses/contacts?: No Does the patient have difficulty concentrating, remembering, or making decisions?: No  Permission Sought/Granted                  Emotional Assessment Appearance:: Appears stated age Attitude/Demeanor/Rapport: Engaged Affect (typically observed): Accepting Orientation: : Oriented  to Situation, Oriented to  Time, Oriented to Place, Oriented to Self Alcohol / Substance Use: Not Applicable Psych Involvement: No (comment)  Admission diagnosis:  Hip fracture (HCC) [S72.009A] Closed fracture of left hip, initial encounter [facility]) [S72.002A] Patient Active Problem List   Diagnosis Date Noted   Hip fracture requiring operative repair, left, closed, initial encounter (HCC) 11/24/2023   Hip fracture (HCC) 11/24/2023   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   Port-A-Cath in place 06/10/2019   Breast cancer (HCC) 04/15/2019   Second degree AV block    Malignant neoplasm of upper-outer quadrant of right breast in female, estrogen receptor positive (HCC) 03/25/2019   Thyromegaly 01/10/2018   Overweight (BMI 25.0-29.9) 08/13/2017   Upper respiratory tract infection 07/30/2017   Type 2 diabetes mellitus with hyperglycemia, with long-term current use of insulin  (HCC) 03/19/2017   Thiamine  deficiency 01/04/2017   Tinnitus aurium, 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   Deficiency anemia 04/30/2016   Routine general medical examination at a health care facility 04/27/2015   Abdominal wall hernia 04/26/2015   Family history of hemochromatosis 09/13/2014   Acute maxillary sinusitis 05/14/2014   History of colonic polyps 03/10/2014   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   Lumbosacral spondylosis without myelopathy 05/12/2013   Insomnia, persistent 02/04/2013   Obesity (BMI 30-39.9) 02/04/2013   Asthma with exacerbation 09/22/2012   Visit for screening mammogram 07/04/2012   Nephrolithiasis    HTN (hypertension)    Gallstones  LBBB (left bundle branch block)    Leukocytosis 12/20/2008   Hyperlipidemia with target LDL less than 100 09/04/2007   Chronic depression 09/04/2007   GERD 09/04/2007   PCP:  Adra Alanis, FNP Pharmacy:   [facility] - [place], [place] - [address] [place] [address] Phone:  [phone] Fax: [phone]  [facility]) - [place], [place] - [address] [facility] [address] [place] [address] [place] [address] Phone: [phone] Fax: [phone]  [facility] - [place], [place] - [address] [place] [address] Phone: [phone] Fax: [phone]     Social Drivers of Health (SDOH) Social History: SDOH Screenings   Food Insecurity: No Food Insecurity (11/24/2023)  Housing: Low Risk  (11/24/2023)  Transportation Needs: No Transportation Needs (11/24/2023)  Utilities: Not At Risk (11/24/2023)  Alcohol Screen: Low Risk  (12/18/2022)  Depression (PHQ2-9): High Risk (12/18/2022)  Financial Resource Strain: Low Risk  (04/26/2023)  Physical Activity: Inactive (04/26/2023)  Social Connections: Moderately Isolated (11/24/2023)  Stress: Stress Concern Present (04/26/2023)  Tobacco Use: Low Risk  (11/24/2023)   SDOH Interventions:     Readmission Risk Interventions    11/25/2023   12:34 PM  Readmission Risk Prevention Plan  Transportation Screening Complete  PCP or Specialist Appt within 5-7 Days Complete  Home Care Screening Complete  Medication Review (RN CM) Complete

## 2023-11-25 NOTE — Plan of Care (Signed)

## 2023-11-25 NOTE — Progress Notes (Signed)
 I am aware of patients left hip fracture. Surgery will not be today. Full consult to follow later today. She may have a diet today.

## 2023-11-26 DIAGNOSIS — S72002A Fracture of unspecified part of neck of left femur, initial encounter for closed fracture: Secondary | ICD-10-CM | POA: Diagnosis not present

## 2023-11-26 LAB — CBC
HCT: 34 % — ABNORMAL LOW (ref 36.0–46.0)
Hemoglobin: 10.7 g/dL — ABNORMAL LOW (ref 12.0–15.0)
MCH: 29.1 pg (ref 26.0–34.0)
MCHC: 31.5 g/dL (ref 30.0–36.0)
MCV: 92.4 fL (ref 80.0–100.0)
Platelets: 442 10*3/uL — ABNORMAL HIGH (ref 150–400)
RBC: 3.68 MIL/uL — ABNORMAL LOW (ref 3.87–5.11)
RDW: 13.1 % (ref 11.5–15.5)
WBC: 16.7 10*3/uL — ABNORMAL HIGH (ref 4.0–10.5)
nRBC: 0 % (ref 0.0–0.2)

## 2023-11-26 LAB — COMPREHENSIVE METABOLIC PANEL WITH GFR
ALT: 11 U/L (ref 0–44)
AST: 13 U/L — ABNORMAL LOW (ref 15–41)
Albumin: 3.5 g/dL (ref 3.5–5.0)
Alkaline Phosphatase: 51 U/L (ref 38–126)
Anion gap: 7 (ref 5–15)
BUN: 22 mg/dL (ref 8–23)
CO2: 24 mmol/L (ref 22–32)
Calcium: 9.2 mg/dL (ref 8.9–10.3)
Chloride: 106 mmol/L (ref 98–111)
Creatinine, Ser: 0.68 mg/dL (ref 0.44–1.00)
GFR, Estimated: >60 mL/min (ref >60)
Glucose, Bld: 134 mg/dL — ABNORMAL HIGH (ref 70–99)
Potassium: 3.8 mmol/L (ref 3.5–5.1)
Sodium: 137 mmol/L (ref 135–145)
Total Bilirubin: 0.6 mg/dL (ref 0.0–1.2)
Total Protein: 6 g/dL — ABNORMAL LOW (ref 6.5–8.1)

## 2023-11-26 LAB — GLUCOSE, CAPILLARY
Glucose-Capillary: 176 mg/dL — ABNORMAL HIGH (ref 70–99)
Glucose-Capillary: 170 mg/dL — ABNORMAL HIGH (ref 70–99)
Glucose-Capillary: 148 mg/dL — ABNORMAL HIGH (ref 70–99)
Glucose-Capillary: 237 mg/dL — ABNORMAL HIGH (ref 70–99)

## 2023-11-26 MED ORDER — TRANEXAMIC ACID 1000 MG/10ML IV SOLN
2000.0000 mg | INTRAVENOUS | Status: AC
  Administered 2023-11-27: 2000 mg via TOPICAL
  Filled 2023-11-26: qty 20

## 2023-11-26 MED ORDER — CARVEDILOL 6.25 MG PO TABS
6.2500 mg | ORAL_TABLET | Freq: Two times a day (BID) | ORAL | Status: DC
  Administered 2023-11-27 – 2023-11-29 (×5): 6.25 mg via ORAL
  Filled 2023-11-26 (×4): qty 1

## 2023-11-26 NOTE — TOC Progression Note (Signed)
 Transition of Care [facility]) - Progression Note    Patient Details  Name: Margaret Norman MRN: [id number] Date of Birth: Nov 10, 1950  [facility]) CM/SW Contact  Delilah Fend, LCSW Phone Number: 11/26/2023, 11:00 AM  Clinical Narrative:    Spoke briefly with pt who confirms plans now for surgery at [facility] tomorrow afternoon.  Pt aware TOC will follow up post sx to review dc needs.     Barriers to Discharge: Continued Medical Work up  Expected Discharge Plan and Services In-house Referral: NA Discharge Planning Services: NA   Living arrangements for the past 2 months: Single Family Home                 DME Arranged: N/A DME Agency: NA       HH Arranged: NA HH Agency: NA         Social Determinants of Health (SDOH) Interventions SDOH Screenings   Food Insecurity: No Food Insecurity (11/24/2023)  Housing: Low Risk  (11/24/2023)  Transportation Needs: No Transportation Needs (11/24/2023)  Utilities: Not At Risk (11/24/2023)  Alcohol Screen: Low Risk  (12/18/2022)  Depression (PHQ2-9): High Risk (12/18/2022)  Financial Resource Strain: Low Risk  (04/26/2023)  Physical Activity: Inactive (04/26/2023)  Social Connections: Moderately Isolated (11/24/2023)  Stress: Stress Concern Present (04/26/2023)  Tobacco Use: Low Risk  (11/24/2023)    Readmission Risk Interventions    11/25/2023   12:34 PM  Readmission Risk Prevention Plan  Transportation Screening Complete  PCP or Specialist Appt within 5-7 Days Complete  Home Care Screening Complete  Medication Review (RN CM) Complete

## 2023-11-26 NOTE — Plan of Care (Signed)
  Problem: Metabolic: Goal: Ability to maintain appropriate glucose levels will improve Outcome: Progressing   Problem: Nutritional: Goal: Maintenance of adequate nutrition will improve Outcome: Progressing   Problem: Skin Integrity: Goal: Risk for impaired skin integrity will decrease Outcome: Progressing   Problem: Clinical Measurements: Goal: Ability to maintain clinical measurements within normal limits will improve Outcome: Progressing Goal: Will remain free from infection Outcome: Progressing   Problem: Pain Managment: Goal: General experience of comfort will improve and/or be controlled Outcome: Progressing   Problem: Safety: Goal: Ability to remain free from injury will improve Outcome: Progressing

## 2023-11-26 NOTE — Progress Notes (Signed)
  Progress Note   Patient: Margaret Norman WUJ:[id number] DOB: 05-24-1951 DOA: 11/24/2023     2 DOS: the patient was seen and examined on 11/26/2023   Brief hospital course: 73 y.o. 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  The patient was already planning to get a left hip replacement w Dr Christiane Cowing. The patient also has been having trouble with gait imbalance and some shakiness.  She also has had poor appetite.  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  Physical therapy has been recommended but they recommended that that wait until after her hip replacement given her hip pain. A lump in her breast was found last month she just had the biopsy that came back benign. In the ED the patient's workup revealed a proximal left femur fracture.  Dr. Julio Ohm of orthopedic surgery was consulted.  Assessment and Plan: Left proximal femur fracture - Orthopedic Surgery following -Recs noted for surgery on Wed at [facility] with non-urgent transfer recommended. Pt to be transferred to [facility] today - cont analgesia as needed   2.DMT2  - Cont SSI as needed - will continue Lantus  10 u at bedtime -Glycemic trends stable   3.  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  - Continue beta blocker perioperatively - She follows with [facility] cardiology, denies chest pains - Holding Entresto  perioperatively for now given elevated Cr that has now normalized    4. Leukocytosis - white blood cell count = 29 at presentation -WBC improved today -Suspect elevated WBC secondary to fracutre -No evidence of acute infection at this time     Subjective: Without complaints  Physical Exam: Vitals:   11/25/23 1031 11/25/23 1643  11/25/23 2122 11/26/23 0539  BP: 129/67 (!) 105/54 103/60 114/66  Pulse: 85 87 88 81  Resp: 16 16 16 16   Temp: 98.3 F (36.8 C) 98.5 F (36.9 C) 99 F (37.2 C) 97.9 F (36.6 C)  TempSrc:   Oral Oral  SpO2: 94% 95% 94% 96%  Weight:      Height:       General exam: Conversant, in no acute distress Respiratory system: normal chest rise, clear, no audible wheezing Cardiovascular system: regular rhythm, s1-s2 Gastrointestinal system: Nondistended, nontender, pos BS Central nervous system: No seizures, no tremors Extremities: No cyanosis, no joint deformities Skin: No rashes, no pallor Psychiatry: Affect normal // no auditory hallucinations   Data Reviewed:  Labs reviewed: Na 137, K 3.8, Cr 0.68, WBC 16.7, Hgb 10.7, Plts 442  Family Communication: Pt in room, family at bedside  Disposition: Status is: Inpatient Remains inpatient appropriate because: severity of illness  Planned Discharge Destination: pending PT/OT eval post-op    Author: Cherylle Corwin, MD 11/26/2023 10:16 AM  For on call review [email or web address].

## 2023-11-26 NOTE — Progress Notes (Signed)
 Report given. [facility] called. Patient and family updated.

## 2023-11-27 ENCOUNTER — Encounter (HOSPITAL_COMMUNITY)
Admission: EM | Disposition: A | Discharge status: Home Health | Payer: Self-pay | Source: Home / Self Care | Attending: Internal Medicine

## 2023-11-27 ENCOUNTER — Inpatient Hospital Stay (HOSPITAL_COMMUNITY)

## 2023-11-27 ENCOUNTER — Other Ambulatory Visit: Payer: Self-pay

## 2023-11-27 DIAGNOSIS — E1165 Type 2 diabetes mellitus with hyperglycemia: Secondary | ICD-10-CM | POA: Diagnosis not present

## 2023-11-27 DIAGNOSIS — S72145A Nondisplaced intertrochanteric fracture of left femur, initial encounter for closed fracture: Secondary | ICD-10-CM

## 2023-11-27 DIAGNOSIS — I509 Heart failure, unspecified: Secondary | ICD-10-CM | POA: Diagnosis not present

## 2023-11-27 DIAGNOSIS — I110 Hypertensive heart disease with heart failure: Secondary | ICD-10-CM

## 2023-11-27 DIAGNOSIS — S72142A Displaced intertrochanteric fracture of left femur, initial encounter for closed fracture: Secondary | ICD-10-CM | POA: Diagnosis not present

## 2023-11-27 DIAGNOSIS — S72002A Fracture of unspecified part of neck of left femur, initial encounter for closed fracture: Secondary | ICD-10-CM | POA: Diagnosis not present

## 2023-11-27 DIAGNOSIS — Z794 Long term (current) use of insulin: Secondary | ICD-10-CM | POA: Diagnosis not present

## 2023-11-27 DIAGNOSIS — I447 Left bundle-branch block, unspecified: Secondary | ICD-10-CM

## 2023-11-27 DIAGNOSIS — D72829 Elevated white blood cell count, unspecified: Secondary | ICD-10-CM

## 2023-11-27 HISTORY — PX: INTRAMEDULLARY (IM) NAIL INTERTROCHANTERIC: SHX5875

## 2023-11-27 LAB — COMPREHENSIVE METABOLIC PANEL WITH GFR
ALT: 10 U/L (ref 0–44)
AST: 16 U/L (ref 15–41)
Albumin: 3 g/dL — ABNORMAL LOW (ref 3.5–5.0)
Alkaline Phosphatase: 45 U/L (ref 38–126)
Anion gap: 9 (ref 5–15)
BUN: 22 mg/dL (ref 8–23)
CO2: 23 mmol/L (ref 22–32)
Calcium: 9.1 mg/dL (ref 8.9–10.3)
Chloride: 105 mmol/L (ref 98–111)
Creatinine, Ser: 0.89 mg/dL (ref 0.44–1.00)
GFR, Estimated: >60 mL/min (ref >60)
Glucose, Bld: 144 mg/dL — ABNORMAL HIGH (ref 70–99)
Potassium: 3.8 mmol/L (ref 3.5–5.1)
Sodium: 137 mmol/L (ref 135–145)
Total Bilirubin: 0.5 mg/dL (ref 0.0–1.2)
Total Protein: 5.7 g/dL — ABNORMAL LOW (ref 6.5–8.1)

## 2023-11-27 LAB — GLUCOSE, CAPILLARY
Glucose-Capillary: 140 mg/dL — ABNORMAL HIGH (ref 70–99)
Glucose-Capillary: 132 mg/dL — ABNORMAL HIGH (ref 70–99)
Glucose-Capillary: 133 mg/dL — ABNORMAL HIGH (ref 70–99)
Glucose-Capillary: 174 mg/dL — ABNORMAL HIGH (ref 70–99)
Glucose-Capillary: 229 mg/dL — ABNORMAL HIGH (ref 70–99)

## 2023-11-27 LAB — CBC
HCT: 32.5 % — ABNORMAL LOW (ref 36.0–46.0)
HCT: 33.4 % — ABNORMAL LOW (ref 36.0–46.0)
Hemoglobin: 10.6 g/dL — ABNORMAL LOW (ref 12.0–15.0)
Hemoglobin: 10.9 g/dL — ABNORMAL LOW (ref 12.0–15.0)
MCH: 29.9 pg (ref 26.0–34.0)
MCH: 29.5 pg (ref 26.0–34.0)
MCHC: 32.6 g/dL (ref 30.0–36.0)
MCHC: 32.6 g/dL (ref 30.0–36.0)
MCV: 91.5 fL (ref 80.0–100.0)
MCV: 90.5 fL (ref 80.0–100.0)
Platelets: 423 10*3/uL — ABNORMAL HIGH (ref 150–400)
Platelets: 413 10*3/uL — ABNORMAL HIGH (ref 150–400)
RBC: 3.55 MIL/uL — ABNORMAL LOW (ref 3.87–5.11)
RBC: 3.69 MIL/uL — ABNORMAL LOW (ref 3.87–5.11)
RDW: 13.2 % (ref 11.5–15.5)
RDW: 13.2 % (ref 11.5–15.5)
WBC: 14.2 10*3/uL — ABNORMAL HIGH (ref 4.0–10.5)
WBC: 22.2 10*3/uL — ABNORMAL HIGH (ref 4.0–10.5)
nRBC: 0 % (ref 0.0–0.2)
nRBC: 0 % (ref 0.0–0.2)

## 2023-11-27 LAB — URINE CULTURE: Culture: 20000 — AB

## 2023-11-27 LAB — CREATININE, SERUM
Creatinine, Ser: 1.03 mg/dL — ABNORMAL HIGH (ref 0.44–1.00)
GFR, Estimated: 57 mL/min — ABNORMAL LOW (ref >60)

## 2023-11-27 SURGERY — FIXATION, FRACTURE, INTERTROCHANTERIC, WITH INTRAMEDULLARY ROD
Anesthesia: General | Site: Leg Upper | Laterality: Left

## 2023-11-27 MED ORDER — OXYCODONE HCL 5 MG PO TABS: 10.0000 mg | ORAL_TABLET | ORAL | Status: DC | PRN

## 2023-11-27 MED ORDER — FENTANYL CITRATE (PF) 100 MCG/2ML IJ SOLN
25.0000 ug | INTRAMUSCULAR | Status: DC | PRN
  Administered 2023-11-27: 25 ug via INTRAVENOUS

## 2023-11-27 MED ORDER — CHLORHEXIDINE GLUCONATE 0.12 % MT SOLN
15.0000 mL | Freq: Once | OROMUCOSAL | Status: AC
  Administered 2023-11-27: 15 mL via OROMUCOSAL
  Filled 2023-11-27: qty 15

## 2023-11-27 MED ORDER — OXYCODONE HCL 5 MG PO TABS
ORAL_TABLET | ORAL | Status: AC
  Filled 2023-11-27: qty 1

## 2023-11-27 MED ORDER — OXYCODONE HCL 5 MG PO TABS
5.0000 mg | ORAL_TABLET | ORAL | Status: DC | PRN
  Administered 2023-11-28: 5 mg via ORAL
  Administered 2023-11-28: 10 mg via ORAL
  Administered 2023-11-29: 5 mg via ORAL
  Filled 2023-11-27: qty 1
  Filled 2023-11-27 (×2): qty 2

## 2023-11-27 MED ORDER — 0.9 % SODIUM CHLORIDE (POUR BTL) OPTIME
TOPICAL | Status: DC | PRN
  Administered 2023-11-27: 1000 mL

## 2023-11-27 MED ORDER — OXYCODONE HCL 5 MG/5ML PO SOLN: 5.0000 mg | Freq: Once | ORAL | Status: AC | PRN

## 2023-11-27 MED ORDER — CEFAZOLIN SODIUM-DEXTROSE 2-4 GM/100ML-% IV SOLN
2.0000 g | Freq: Four times a day (QID) | INTRAVENOUS | Status: AC
  Administered 2023-11-27 – 2023-11-28 (×3): 2 g via INTRAVENOUS
  Filled 2023-11-27 (×3): qty 100

## 2023-11-27 MED ORDER — ONDANSETRON HCL 4 MG/2ML IJ SOLN
INTRAMUSCULAR | Status: AC
  Filled 2023-11-27: qty 2

## 2023-11-27 MED ORDER — LIDOCAINE 2% (20 MG/ML) 5 ML SYRINGE
INTRAMUSCULAR | Status: DC | PRN
  Administered 2023-11-27: 50 mg via INTRAVENOUS

## 2023-11-27 MED ORDER — ROCURONIUM BROMIDE 10 MG/ML (PF) SYRINGE
PREFILLED_SYRINGE | INTRAVENOUS | Status: AC
  Filled 2023-11-27: qty 10

## 2023-11-27 MED ORDER — ALUM & MAG HYDROXIDE-SIMETH 200-200-20 MG/5ML PO SUSP: 30.0000 mL | ORAL | Status: DC | PRN

## 2023-11-27 MED ORDER — ACETAMINOPHEN 10 MG/ML IV SOLN
INTRAVENOUS | Status: AC
Start: 2023-11-27 — End: ?
  Filled 2023-11-27: qty 100

## 2023-11-27 MED ORDER — PHENYLEPHRINE 80 MCG/ML (10ML) SYRINGE FOR IV PUSH (FOR BLOOD PRESSURE SUPPORT)
PREFILLED_SYRINGE | INTRAVENOUS | Status: AC
  Filled 2023-11-27: qty 10

## 2023-11-27 MED ORDER — ACETAMINOPHEN 325 MG PO TABS: 325.0000 mg | ORAL_TABLET | Freq: Four times a day (QID) | ORAL | Status: DC | PRN

## 2023-11-27 MED ORDER — DEXAMETHASONE SODIUM PHOSPHATE 10 MG/ML IJ SOLN
INTRAMUSCULAR | Status: AC
  Filled 2023-11-27: qty 1

## 2023-11-27 MED ORDER — POLYETHYLENE GLYCOL 3350 17 G PO PACK
17.0000 g | PACK | Freq: Every day | ORAL | Status: DC | PRN
  Administered 2023-11-28: 17 g via ORAL
  Filled 2023-11-27: qty 1

## 2023-11-27 MED ORDER — POLYETHYLENE GLYCOL 3350 17 G PO PACK: 17.0000 g | PACK | Freq: Two times a day (BID) | ORAL | Status: DC

## 2023-11-27 MED ORDER — ONDANSETRON HCL 4 MG/2ML IJ SOLN
INTRAMUSCULAR | Status: DC | PRN
  Administered 2023-11-27: 4 mg via INTRAVENOUS

## 2023-11-27 MED ORDER — HYDROMORPHONE HCL 1 MG/ML IJ SOLN
0.5000 mg | INTRAMUSCULAR | Status: DC | PRN
  Administered 2023-11-27: 1 mg via INTRAVENOUS
  Filled 2023-11-27: qty 1

## 2023-11-27 MED ORDER — PROPOFOL 10 MG/ML IV BOLUS
INTRAVENOUS | Status: AC
Start: 2023-11-27 — End: ?
  Filled 2023-11-27: qty 20

## 2023-11-27 MED ORDER — ACETAMINOPHEN 500 MG PO TABS
1000.0000 mg | ORAL_TABLET | Freq: Four times a day (QID) | ORAL | Status: AC
  Administered 2023-11-27 – 2023-11-28 (×4): 1000 mg via ORAL
  Filled 2023-11-27 (×4): qty 2

## 2023-11-27 MED ORDER — SORBITOL 70 % SOLN: 30.0000 mL | Freq: Every day | Status: DC | PRN

## 2023-11-27 MED ORDER — FENTANYL CITRATE (PF) 100 MCG/2ML IJ SOLN
INTRAMUSCULAR | Status: AC
  Filled 2023-11-27: qty 2

## 2023-11-27 MED ORDER — ENOXAPARIN SODIUM 40 MG/0.4ML IJ SOSY
40.0000 mg | PREFILLED_SYRINGE | INTRAMUSCULAR | Status: DC
  Administered 2023-11-28 – 2023-11-29 (×2): 40 mg via SUBCUTANEOUS
  Filled 2023-11-27 (×2): qty 0.4

## 2023-11-27 MED ORDER — MIDAZOLAM HCL 2 MG/2ML IJ SOLN
INTRAMUSCULAR | Status: DC | PRN
  Administered 2023-11-27: 1 mg via INTRAVENOUS

## 2023-11-27 MED ORDER — OXYCODONE HCL 5 MG PO TABS
5.0000 mg | ORAL_TABLET | Freq: Once | ORAL | Status: AC | PRN
  Administered 2023-11-27: 5 mg via ORAL

## 2023-11-27 MED ORDER — ACETAMINOPHEN 10 MG/ML IV SOLN
INTRAVENOUS | Status: DC | PRN
  Administered 2023-11-27: 1000 mg via INTRAVENOUS

## 2023-11-27 MED ORDER — SUGAMMADEX SODIUM 200 MG/2ML IV SOLN
INTRAVENOUS | Status: DC | PRN
  Administered 2023-11-27: 100 mg via INTRAVENOUS
  Administered 2023-11-27: 200 mg via INTRAVENOUS

## 2023-11-27 MED ORDER — PROPOFOL 10 MG/ML IV BOLUS
INTRAVENOUS | Status: DC | PRN
  Administered 2023-11-27: 100 mg via INTRAVENOUS

## 2023-11-27 MED ORDER — CEFAZOLIN SODIUM-DEXTROSE 2-4 GM/100ML-% IV SOLN
2.0000 g | INTRAVENOUS | Status: AC
Start: 2023-11-27 — End: 2023-11-27
  Administered 2023-11-27: 2 g via INTRAVENOUS
  Filled 2023-11-27: qty 100

## 2023-11-27 MED ORDER — ORAL CARE MOUTH RINSE: 15.0000 mL | Freq: Once | OROMUCOSAL | Status: DC

## 2023-11-27 MED ORDER — CHLORHEXIDINE GLUCONATE 0.12 % MT SOLN: 15.0000 mL | Freq: Once | OROMUCOSAL | Status: DC

## 2023-11-27 MED ORDER — METHOCARBAMOL 1000 MG/10ML IJ SOLN: 500.0000 mg | Freq: Four times a day (QID) | INTRAMUSCULAR | Status: DC | PRN

## 2023-11-27 MED ORDER — MIDAZOLAM HCL 2 MG/2ML IJ SOLN
INTRAMUSCULAR | Status: AC
  Filled 2023-11-27: qty 2

## 2023-11-27 MED ORDER — DOCUSATE SODIUM 100 MG PO CAPS
100.0000 mg | ORAL_CAPSULE | Freq: Two times a day (BID) | ORAL | Status: DC
  Administered 2023-11-27 – 2023-11-29 (×4): 100 mg via ORAL
  Filled 2023-11-27 (×4): qty 1

## 2023-11-27 MED ORDER — FENTANYL CITRATE (PF) 250 MCG/5ML IJ SOLN
INTRAMUSCULAR | Status: AC
Start: 2023-11-27 — End: ?
  Filled 2023-11-27: qty 5

## 2023-11-27 MED ORDER — TRANEXAMIC ACID-NACL 1000-0.7 MG/100ML-% IV SOLN
1000.0000 mg | INTRAVENOUS | Status: DC
  Filled 2023-11-27: qty 100

## 2023-11-27 MED ORDER — FENTANYL CITRATE (PF) 250 MCG/5ML IJ SOLN
INTRAMUSCULAR | Status: DC | PRN
  Administered 2023-11-27 (×2): 50 ug via INTRAVENOUS

## 2023-11-27 MED ORDER — METHOCARBAMOL 500 MG PO TABS
500.0000 mg | ORAL_TABLET | Freq: Four times a day (QID) | ORAL | Status: DC | PRN
  Administered 2023-11-28 – 2023-11-29 (×2): 500 mg via ORAL
  Filled 2023-11-27 (×2): qty 1

## 2023-11-27 MED ORDER — ORAL CARE MOUTH RINSE: 15.0000 mL | Freq: Once | OROMUCOSAL | Status: AC

## 2023-11-27 MED ORDER — PHENOL 1.4 % MT LIQD: 1.0000 | OROMUCOSAL | Status: DC | PRN

## 2023-11-27 MED ORDER — ONDANSETRON HCL 4 MG/2ML IJ SOLN: 4.0000 mg | Freq: Four times a day (QID) | INTRAMUSCULAR | Status: DC | PRN

## 2023-11-27 MED ORDER — MAGNESIUM CITRATE PO SOLN
1.0000 | Freq: Once | ORAL | Status: DC | PRN
Start: 2023-11-27 — End: 2023-11-29

## 2023-11-27 MED ORDER — PHENYLEPHRINE HCL (PRESSORS) 10 MG/ML IV SOLN
INTRAVENOUS | Status: DC | PRN
  Administered 2023-11-27 (×2): 80 ug via INTRAVENOUS

## 2023-11-27 MED ORDER — ONDANSETRON HCL 4 MG PO TABS: 4.0000 mg | ORAL_TABLET | Freq: Four times a day (QID) | ORAL | Status: DC | PRN

## 2023-11-27 MED ORDER — LACTATED RINGERS IV SOLN: INTRAVENOUS | Status: DC

## 2023-11-27 MED ORDER — DEXAMETHASONE SODIUM PHOSPHATE 10 MG/ML IJ SOLN
INTRAMUSCULAR | Status: DC | PRN
  Administered 2023-11-27: 5 mg via INTRAVENOUS

## 2023-11-27 MED ORDER — LIDOCAINE 2% (20 MG/ML) 5 ML SYRINGE
INTRAMUSCULAR | Status: AC
  Filled 2023-11-27: qty 5

## 2023-11-27 MED ORDER — ACETAMINOPHEN 10 MG/ML IV SOLN: 1000.0000 mg | Freq: Once | INTRAVENOUS | Status: DC | PRN

## 2023-11-27 MED ORDER — POVIDONE-IODINE 10 % EX SWAB
2.0000 | Freq: Once | CUTANEOUS | Status: AC
  Administered 2023-11-27: 2 via TOPICAL

## 2023-11-27 MED ORDER — ROCURONIUM BROMIDE 10 MG/ML (PF) SYRINGE
PREFILLED_SYRINGE | INTRAVENOUS | Status: DC | PRN
  Administered 2023-11-27: 50 mg via INTRAVENOUS

## 2023-11-27 MED ORDER — MENTHOL 3 MG MT LOZG: 1.0000 | LOZENGE | OROMUCOSAL | Status: DC | PRN

## 2023-11-27 MED ORDER — TRANEXAMIC ACID-NACL 1000-0.7 MG/100ML-% IV SOLN
1000.0000 mg | Freq: Once | INTRAVENOUS | Status: AC
  Administered 2023-11-27: 1000 mg via INTRAVENOUS
  Filled 2023-11-27: qty 100

## 2023-11-27 SURGICAL SUPPLY — 43 items
BAG COUNTER SPONGE SURGICOUNT (BAG) ×1 IMPLANT
BIT DRILL CANN 4.3X413 (BIT) IMPLANT
BNDG COHESIVE 4X5 TAN STRL LF (GAUZE/BANDAGES/DRESSINGS) ×1 IMPLANT
BNDG COHESIVE 6X5 TAN ST LF (GAUZE/BANDAGES/DRESSINGS) IMPLANT
BNDG GAUZE DERMACEA FLUFF 4 (GAUZE/BANDAGES/DRESSINGS) ×1 IMPLANT
BOLT FEM NECK SYS 90 LENGTH (Bolt) IMPLANT
COVER PERINEAL POST (MISCELLANEOUS) ×1 IMPLANT
COVER SURGICAL LIGHT HANDLE (MISCELLANEOUS) ×1 IMPLANT
DRAPE C-ARMOR (DRAPES) ×1 IMPLANT
DRAPE STERI IOBAN 125X83 (DRAPES) ×1 IMPLANT
DRESSING MEPILEX FLEX 4X4 (GAUZE/BANDAGES/DRESSINGS) ×1 IMPLANT
DRSG MEPILEX POST OP 4X8 (GAUZE/BANDAGES/DRESSINGS) ×1 IMPLANT
DURAPREP 26ML APPLICATOR (WOUND CARE) ×1 IMPLANT
ELECTRODE REM PT RTRN 9FT ADLT (ELECTROSURGICAL) ×1 IMPLANT
GAUZE PAD ABD 8X10 STRL (GAUZE/BANDAGES/DRESSINGS) ×2 IMPLANT
GLOVE BIOGEL PI IND STRL 7.0 (GLOVE) ×2 IMPLANT
GLOVE BIOGEL PI IND STRL 7.5 (GLOVE) ×1 IMPLANT
GLOVE ECLIPSE 7.0 STRL STRAW (GLOVE) ×1 IMPLANT
GLOVE SKINSENSE STRL SZ7.5 (GLOVE) ×2 IMPLANT
GLOVE SURG SYN 7.5 E (GLOVE) ×2 IMPLANT
GLOVE SURG SYN 7.5 PF PI (GLOVE) ×2 IMPLANT
GLOVE SURG UNDER POLY LF SZ7 (GLOVE) ×19 IMPLANT
GLOVE SURG UNDER POLY LF SZ7.5 (GLOVE) ×4 IMPLANT
GOWN STRL SURGICAL XL XLNG (GOWN DISPOSABLE) ×1 IMPLANT
GUIDEWIRE 3.2X400 (WIRE) IMPLANT
KIT BASIN OR (CUSTOM PROCEDURE TRAY) ×1 IMPLANT
KIT TURNOVER KIT B (KITS) ×1 IMPLANT
MANIFOLD NEPTUNE II (INSTRUMENTS) ×1 IMPLANT
NS IRRIG 1000ML POUR BTL (IV SOLUTION) ×1 IMPLANT
PACK GENERAL/GYN (CUSTOM PROCEDURE TRAY) ×1 IMPLANT
PAD ARMBOARD POSITIONER FOAM (MISCELLANEOUS) ×2 IMPLANT
PAD CAST 4YDX4 CTTN HI CHSV (CAST SUPPLIES) ×2 IMPLANT
PLATE FEM NECK 1H (Plate) IMPLANT
REAMER DRILL BIT 10.2X251 (INSTRUMENTS) IMPLANT
SCREW ANTIROTATN FEM NECH 90 L (Screw) IMPLANT
SCREW LOCK FT STRV 5X34 ST (Screw) IMPLANT
STAPLER SKIN PROX 35W (STAPLE) ×1 IMPLANT
SUT VIC AB 0 CT1 27XBRD ANBCTR (SUTURE) ×1 IMPLANT
SUT VIC AB 1 CT1 36 (SUTURE) IMPLANT
SUT VIC AB 2-0 CT1 TAPERPNT 27 (SUTURE) ×1 IMPLANT
TOWEL GREEN STERILE (TOWEL DISPOSABLE) ×1 IMPLANT
TOWEL GREEN STERILE FF (TOWEL DISPOSABLE) ×1 IMPLANT
WATER STERILE IRR 1000ML POUR (IV SOLUTION) ×1 IMPLANT

## 2023-11-27 NOTE — Anesthesia Procedure Notes (Signed)
 Procedure Name: Intubation Date/Time: 11/27/2023 2:16 PM  Performed by: Ballard Levels, CRNAPre-anesthesia Checklist: Patient identified, Emergency Drugs available, Suction available and Patient being monitored Patient Re-evaluated:Patient Re-evaluated prior to induction Oxygen Delivery Method: Circle System Utilized Preoxygenation: Pre-oxygenation with 100% oxygen Induction Type: IV induction Ventilation: Mask ventilation without difficulty Tube type: Oral Tube size: 7.0 mm Number of attempts: 1 Airway Equipment and Method: Stylet Placement Confirmation: ETT inserted through vocal cords under direct vision, positive ETCO2 and breath sounds checked- equal and bilateral Secured at: 21 cm Tube secured with: Tape Dental Injury: Teeth and Oropharynx as per pre-operative assessment

## 2023-11-27 NOTE — H&P (Signed)

## 2023-11-27 NOTE — Anesthesia Preprocedure Evaluation (Signed)
 Anesthesia Evaluation  Patient identified by MRN, date of birth, ID band Patient awake    Reviewed: Allergy & Precautions, NPO status , Patient's Chart, lab work & pertinent test results, reviewed documented beta blocker date and time   History of Anesthesia Complications Negative for: history of anesthetic complications  Airway Mallampati: III  TM Distance: >3 FB Neck ROM: Full    Dental  (+) Teeth Intact, Dental Advisory Given   Pulmonary neg shortness of breath, asthma , neg sleep apnea, neg COPD, neg recent URI   breath sounds clear to auscultation       Cardiovascular hypertension, Pt. on medications and Pt. on home beta blockers (-) angina +CHF  (-) CAD + dysrhythmias  Rhythm:Regular  1. Left ventricular ejection fraction, by estimation, is 45 to 50%. Left  ventricular ejection fraction by 3D volume is 45 %. The left ventricle has  mildly decreased function. There is septal-lateral dyssynchrony due to  LBBB. Left ventricular diastolic  parameters are consistent with Grade I diastolic dysfunction (impaired  relaxation).   2. Right ventricular systolic function is hyperdynamic. The right  ventricular size is normal.   3. The mitral valve is grossly normal. Trivial mitral valve  regurgitation. No evidence of mitral stenosis.   4. The aortic valve is tricuspid. There is mild calcification of the  aortic valve. There is mild thickening of the aortic valve. Aortic valve  regurgitation is not visualized. Aortic valve sclerosis/calcification is  present, without any evidence of  aortic stenosis.   5. The inferior vena cava is normal in size with greater than 50%  respiratory variability, suggesting right atrial pressure of 3 mmHg.     Neuro/Psych  PSYCHIATRIC DISORDERS  Depression       GI/Hepatic Neg liver ROS,GERD  ,,  Endo/Other  diabetes    Renal/GU Lab Results      Component                Value               Date                       NA                       137                 11/27/2023                K                        3.8                 11/27/2023                CO2                      23                  11/27/2023                GLUCOSE                  144 (H)             11/27/2023                BUN  22                  11/27/2023                CREATININE               0.89                11/27/2023                CALCIUM                   9.1                 11/27/2023                GFR                      59.73 (L)           03/21/2023                GFRNONAA                 >60                 11/27/2023                Musculoskeletal  (+) Arthritis ,    Abdominal   Peds  Hematology  (+) Blood dyscrasia, anemia Lab Results      Component                Value               Date                      WBC                      14.2 (H)            11/27/2023                HGB                      10.6 (L)            11/27/2023                HCT                      32.5 (L)            11/27/2023                MCV                      91.5                11/27/2023                PLT                      423 (H)             11/27/2023              Anesthesia Other Findings   Reproductive/Obstetrics  Anesthesia Physical Anesthesia Plan  ASA: 3  Anesthesia Plan: General   Post-op Pain Management: Ofirmev  IV (intra-op)*   Induction: Intravenous  PONV Risk Score and Plan: 4 or greater and Ondansetron  and Dexamethasone   Airway Management Planned: Oral ETT  Additional Equipment: None  Intra-op Plan:   Post-operative Plan: Extubation in OR  Informed Consent: I have reviewed the patients History and Physical, chart, labs and discussed the procedure including the risks, benefits and alternatives for the proposed anesthesia with the patient or authorized representative who has indicated his/her  understanding and acceptance.     Dental advisory given  Plan Discussed with: CRNA  Anesthesia Plan Comments:          Anesthesia Quick Evaluation

## 2023-11-27 NOTE — Plan of Care (Signed)
  Problem: Tissue Perfusion: Goal: Adequacy of tissue perfusion will improve Outcome: Not Progressing   Problem: Skin Integrity: Goal: Risk for impaired skin integrity will decrease Outcome: Not Progressing   Problem: Activity: Goal: Risk for activity intolerance will decrease Outcome: Not Progressing   Problem: Pain Managment: Goal: General experience of comfort will improve and/or be controlled Outcome: Not Progressing   Problem: Elimination: Goal: Will not experience complications related to urinary retention Outcome: Not Progressing   Problem: Skin Integrity: Goal: Risk for impaired skin integrity will decrease Outcome: Not Progressing   Problem: Safety: Goal: Ability to remain free from injury will improve Outcome: Not Progressing

## 2023-11-27 NOTE — Op Note (Signed)
   Date of Surgery: 11/27/2023  INDICATIONS: Margaret Norman is a 73 y.o.-year-old female who sustained a left intertrochanteric femur fracture. The risks and benefits of the procedure discussed with the patient prior to the procedure and all questions were answered; consent was obtained.  PREOPERATIVE DIAGNOSIS: left intertrochanteric femur fracture   POSTOPERATIVE DIAGNOSIS: Same   PROCEDURE: Open treatment of intertrochanteric fracture with plate/screw type implant  SURGEON: Erol Flanagin M. Christiane Cowing, M.D.   ASSIST: Sharran Decent, [place]  ANESTHESIA: general   IV FLUIDS AND URINE: See anesthesia record   ESTIMATED BLOOD LOSS: 100 cc   IMPLANTS: Synthes FNS 90 mm bolt, 1 hole locking side plate  DRAINS: None.   COMPLICATIONS: see description of procedure.   DESCRIPTION OF PROCEDURE: The patient was brought to the operating room and placed supine on the operating table. The patient's leg had been signed prior to the procedure. The patient had the anesthesia placed by the anesthesiologist. The prep verification and incision time-outs were performed to confirm that this was the correct patient, site, side and location. The patient had an SCD on the opposite lower extremity. The patient did receive antibiotics prior to the incision and was re-dosed during the procedure as needed at indicated intervals. The patient was positioned on the HANA table. The well leg was placed in a scissor position and all bony prominences were well-padded. The patient had the lower extremity prepped and draped in the standard surgical fashion.    Fluoroscopy was brought into planned the incision.  A 4 cm incision was made on the lateral aspect of the hip.  Dissection was carried down through the subcutaneous tissue and the IT band was sharply incised in line with the incision.  Cobb elevator was used to clear the vastus from the femoral cortex.  Guidepin was inserted up the femoral neck and into the femoral head as center  center as possible checking on serial fluoroscopy shots.  The pin was measured to be 90 mm.  The reamer was then placed over the pin and reamed to the proper depth with a little bit of countersink to accommodate the locking bolt and the sideplate.  The reamer was then removed and the 1 hole plate was gently tamped down into position.  A bicortical locking screw was then drilled and placed.  An antirotation screw was placed through the jig.  Final x-rays were taken.  Surgical site was thoroughly irrigated and closed in the usual layered fashion.  Sterile dressings were applied.  Patient tolerated procedure and had no immediate complications.  Trellis Fries was necessary for opening, closing, retracting, limb positioning and overall facilitation and completion of the surgery.  POSTOPERATIVE PLAN: The patient will be weight bearing as tolerated and will return in 2 weeks for staple removal and the patient will receive DVT prophylaxis based on other medications, activity level, and risk ratio of bleeding to thrombosis.   Sidonie Drape, MD [facility] 3:01 PM

## 2023-11-27 NOTE — Progress Notes (Signed)
 [facility] PROGRESS NOTE    Progress Note  Margaret Norman  ZOX:[id number] DOB: [birth year] DOA: 11/24/2023 PCP: Adra Alanis, FNP     Brief Narrative:   Margaret Norman is an 73 y.o. 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  Assessment/Plan:   Hip fracture requiring operative repair, left, closed, initial encounter [facility]) MRI of the left hip showed nondisplaced intertrochanteric fracture of the left proximal femur with severe osteoarthritis of the left hip Orthopedic surgery was consulted recommended n.p.o. for surgical intervention on 11/27/2023. Continue narcotics for pain. Placed on a bowel regimen.  Type 2 diabetes mellitus with hyperglycemia, with long-term current use of insulin  (HCC) Continue long-acting insulin  sliding scale CBGs every 4 hours.  Leukocytosis: Probably reactive likely due to fractures  Nonischemic cardiomyopathy due to Herceptin : Continue beta-blockers. Follow-up with cardiology as an outpatient. Hold Entresto  for surgical intervention.  Normocytic anemia: Noted continue to monitor closely.  Thrombocytosis: Likely reactive.    DVT prophylaxis: lovenox  Family Communication:none Status is: Inpatient Remains inpatient appropriate because: Left hip fracture    Code Status:  Code Status History     Date Active Date Inactive Code Status Order ID Comments User Context   09/28/2019 0834 09/28/2019 1603 Full Code [id number]  Darlis Eisenmenger, MD Inpatient   04/15/2019 1456 04/16/2019 1526 Full Code [id number]  Oza Blumenthal, MD Inpatient         IV Access:   Peripheral IV   Procedures and diagnostic studies:   DG CHEST PORT 1  VIEW Result Date: 11/25/2023 CLINICAL DATA:  Congestive heart failure EXAM: PORTABLE CHEST 1 VIEW COMPARISON:  Chest radiograph dated 04/26/2023 FINDINGS: Patient is rotated to the right. Normal lung volumes. No focal consolidations. No pleural effusion or pneumothorax. Enlarged cardiomediastinal silhouette is likely projectional. Degenerative changes of bilateral shoulders. Right upper quadrant surgical clips. Right breast surgical clips. IMPRESSION: 1.  No focal consolidations. 2. Enlarged cardiomediastinal silhouette is likely projectional. Electronically Signed   By: Ula Gambler M.D.   On: 11/25/2023 15:13     Medical Consultants:   None.   Subjective:    Margaret Norman relates her pain is controlled has not had a bowel movement  Objective:    Vitals:   11/26/23 1715 11/26/23 2132 11/27/23 0519 11/27/23 0727  BP: (!) 110/57 118/60 121/66 124/65  Pulse: 79 84 81 78  Resp: 16 18 18 16   Temp: 98.2 F (36.8 C) 98.1 F (36.7 C) 98.1 F (36.7 C) 98 F (36.7 C)  TempSrc:   Oral   SpO2: 94% 94% 94% 94%  Weight:      Height:       SpO2: 94 %   Intake/Output Summary (Last 24 hours) at 11/27/2023 0804 Last data filed at 11/27/2023 0533 Gross per 24 hour  Intake --  Output 600 ml  Net -600 ml   Filed Weights   11/24/23 1638  Weight: 60.3 kg    Exam: General exam: In no acute distress. Respiratory system: Good air movement and clear to auscultation. Cardiovascular system: S1 & S2 heard, RRR. No JVD. Gastrointestinal system: Abdomen is nondistended, soft and nontender.  Extremities: No pedal edema. Skin: No rashes, lesions or ulcers Psychiatry: Judgement and insight appear normal. Mood & affect appropriate.    Data  Reviewed:    Labs: Basic Metabolic Panel: Recent Labs  Lab 11/24/23 1718 11/25/23 1441 11/26/23 0323 11/27/23 0545  NA 139  --  137 137  K 3.8  --  3.8 3.8  CL 106  --  106 105  CO2 18*  --  24 23  GLUCOSE 139*  --  134* 144*  BUN 36*  --  22 22   CREATININE 1.23* 0.78 0.68 0.89  CALCIUM  9.5  --  9.2 9.1   GFR Estimated Creatinine Clearance: 48.2 mL/min (by C-G formula based on SCr of 0.89 mg/dL). Liver Function Tests: Recent Labs  Lab 11/24/23 1718 11/26/23 0323 11/27/23 0545  AST 20 13* 16  ALT 15 11 10   ALKPHOS 60 51 45  BILITOT 0.8 0.6 0.5  PROT 7.0 6.0* 5.7*  ALBUMIN 4.1 3.5 3.0*   No results for input(s): "LIPASE", "AMYLASE" in the last 168 hours. No results for input(s): "AMMONIA" in the last 168 hours. Coagulation profile No results for input(s): "INR", "PROTIME" in the last 168 hours. COVID-19 Labs  No results for input(s): "DDIMER", "FERRITIN", "LDH", "CRP" in the last 72 hours.  Lab Results  Component Value Date   SARSCOV2NAA NEGATIVE 06/30/2021   SARSCOV2NAA NEGATIVE 04/21/2020   SARSCOV2NAA NEGATIVE 09/25/2019   SARSCOV2NAA NOT DETECTED 04/11/2019    CBC: Recent Labs  Lab 11/24/23 1718 11/25/23 1441 11/26/23 0323 11/27/23 0545  WBC 29.1* 22.2* 16.7* 14.2*  NEUTROABS 23.4*  --   --   --   HGB 11.6* 11.0* 10.7* 10.6*  HCT 37.5 34.7* 34.0* 32.5*  MCV 95.9 93.8 92.4 91.5  PLT 473* 410* 442* 423*   Cardiac Enzymes: No results for input(s): "CKTOTAL", "CKMB", "CKMBINDEX", "TROPONINI" in the last 168 hours. BNP (last 3 results) No results for input(s): "PROBNP" in the last 8760 hours. CBG: Recent Labs  Lab 11/26/23 0732 11/26/23 1149 11/26/23 1712 11/26/23 2130 11/27/23 0610  GLUCAP 176* 170* 148* 237* 140*   D-Dimer: No results for input(s): "DDIMER" in the last 72 hours. Hgb A1c: No results for input(s): "HGBA1C" in the last 72 hours. Lipid Profile: No results for input(s): "CHOL", "HDL", "LDLCALC", "TRIG", "CHOLHDL", "LDLDIRECT" in the last 72 hours. Thyroid  function studies: No results for input(s): "TSH", "T4TOTAL", "T3FREE", "THYROIDAB" in the last 72 hours.  Invalid input(s): "FREET3" Anemia work up: No results for input(s): "VITAMINB12", "FOLATE", "FERRITIN", "TIBC",  "IRON", "RETICCTPCT" in the last 72 hours. Sepsis Labs: Recent Labs  Lab 11/24/23 1718 11/25/23 1441 11/26/23 0323 11/27/23 0545  WBC 29.1* 22.2* 16.7* 14.2*   Microbiology Recent Results (from the past 240 hours)  Surgical PCR screen     Status: None   Collection Time: 11/24/23 11:58 PM   Specimen: Nasal Mucosa; Nasal Swab  Result Value Ref Range Status   MRSA, PCR NEGATIVE NEGATIVE Final   Staphylococcus aureus NEGATIVE NEGATIVE Final    Comment: (NOTE) The Xpert SA Assay (FDA approved for NASAL specimens in patients 52 years of age and older), is one component of a comprehensive surveillance program. It is not intended to diagnose infection nor to guide or monitor treatment. Performed at [facility], [address], [place], [place] [address]   Urine Culture     Status: Abnormal (Preliminary result)   Collection Time: 11/25/23 10:20 AM   Specimen: Urine, Random  Result Value Ref Range Status   Specimen Description   Final    URINE, RANDOM Performed at [facility], [address] [place], [place] [address]  Special Requests   Final    NONE Reflexed from [id number] Performed at [facility], [address], [place], [place] [address]    Culture 20,000 COLONIES/mL KLEBSIELLA SPECIES (A)  Final   Report Status PENDING  Incomplete     Medications:    anastrozole   1 mg Oral Daily   buPROPion   300 mg Oral q morning   carvedilol   6.25 mg Oral BID WC   Chlorhexidine  Gluconate Cloth  6 each Topical Daily   cholecalciferol  2,000 Units Oral Daily   docusate sodium  200 mg Oral QHS   feeding supplement  237 mL Oral BID BM   insulin  aspart  0-6 Units Subcutaneous TID WC   insulin  glargine-yfgn  10 Units Subcutaneous QHS   pantoprazole  40 mg Oral Daily   sertraline   100 mg Oral Daily   tranexamic acid (CYKLOKAPRON) 2,000 mg in sodium chloride  0.9 % 50 mL Topical Application  2,000 mg Topical To OR   Continuous  Infusions:    LOS: 3 days   Macdonald Savoy  [facility]  11/27/2023, 8:04 AM

## 2023-11-27 NOTE — Transfer of Care (Signed)
 Immediate Anesthesia Transfer of Care Note  Patient: Margaret Norman  Procedure(s) Performed: LEFT OPEN REDUCTION INTERNAL FIXATION (ORIF) INTERTROCHANTOR (Left: Leg Upper)  Patient Location: PACU  Anesthesia Type:General  Level of Consciousness: awake  Airway & Oxygen Therapy: Patient Spontanous Breathing and Patient connected to face mask oxygen  Post-op Assessment: Report given to RN and Post -op Vital signs reviewed and stable  Post vital signs: Reviewed and stable  Last Vitals:  Vitals Value Taken Time  BP 119/74 11/27/23 1530  Temp    Pulse 92 11/27/23 1532  Resp 20 11/27/23 1532  SpO2 89 % 11/27/23 1532  Vitals shown include unfiled device data.  Last Pain:  Vitals:   11/27/23 1356  TempSrc:   PainSc: 0-No pain      Patients Stated Pain Goal: 3 (11/26/23 0956)  Complications: No notable events documented.

## 2023-11-28 DIAGNOSIS — D72829 Elevated white blood cell count, unspecified: Secondary | ICD-10-CM | POA: Diagnosis not present

## 2023-11-28 DIAGNOSIS — Z794 Long term (current) use of insulin: Secondary | ICD-10-CM | POA: Diagnosis not present

## 2023-11-28 DIAGNOSIS — S72002A Fracture of unspecified part of neck of left femur, initial encounter for closed fracture: Secondary | ICD-10-CM | POA: Diagnosis not present

## 2023-11-28 DIAGNOSIS — E1165 Type 2 diabetes mellitus with hyperglycemia: Secondary | ICD-10-CM | POA: Diagnosis not present

## 2023-11-28 LAB — BASIC METABOLIC PANEL WITH GFR
Anion gap: 11 (ref 5–15)
BUN: 18 mg/dL (ref 8–23)
CO2: 24 mmol/L (ref 22–32)
Calcium: 9 mg/dL (ref 8.9–10.3)
Chloride: 100 mmol/L (ref 98–111)
Creatinine, Ser: 1 mg/dL (ref 0.44–1.00)
GFR, Estimated: 59 mL/min — ABNORMAL LOW (ref >60)
Glucose, Bld: 223 mg/dL — ABNORMAL HIGH (ref 70–99)
Potassium: 4.4 mmol/L (ref 3.5–5.1)
Sodium: 135 mmol/L (ref 135–145)

## 2023-11-28 LAB — GLUCOSE, CAPILLARY
Glucose-Capillary: 218 mg/dL — ABNORMAL HIGH (ref 70–99)
Glucose-Capillary: 244 mg/dL — ABNORMAL HIGH (ref 70–99)
Glucose-Capillary: 145 mg/dL — ABNORMAL HIGH (ref 70–99)
Glucose-Capillary: 170 mg/dL — ABNORMAL HIGH (ref 70–99)

## 2023-11-28 LAB — CBC
HCT: 31.7 % — ABNORMAL LOW (ref 36.0–46.0)
Hemoglobin: 10.4 g/dL — ABNORMAL LOW (ref 12.0–15.0)
MCH: 29.9 pg (ref 26.0–34.0)
MCHC: 32.8 g/dL (ref 30.0–36.0)
MCV: 91.1 fL (ref 80.0–100.0)
Platelets: 420 10*3/uL — ABNORMAL HIGH (ref 150–400)
RBC: 3.48 MIL/uL — ABNORMAL LOW (ref 3.87–5.11)
RDW: 13.2 % (ref 11.5–15.5)
WBC: 16.4 10*3/uL — ABNORMAL HIGH (ref 4.0–10.5)
nRBC: 0 % (ref 0.0–0.2)

## 2023-11-28 MED ORDER — HYDROCODONE-ACETAMINOPHEN 5-325 MG PO TABS: 1.0000 | ORAL_TABLET | Freq: Four times a day (QID) | ORAL | 0 refills | Status: DC | PRN

## 2023-11-28 MED ORDER — INSULIN GLARGINE-YFGN 100 UNIT/ML ~~LOC~~ SOLN
10.0000 [IU] | Freq: Two times a day (BID) | SUBCUTANEOUS | Status: DC
  Administered 2023-11-28 – 2023-11-29 (×3): 10 [IU] via SUBCUTANEOUS
  Filled 2023-11-28 (×4): qty 0.1

## 2023-11-28 MED ORDER — INSULIN ASPART 100 UNIT/ML IJ SOLN: 0.0000 [IU] | Freq: Every day | INTRAMUSCULAR | Status: DC

## 2023-11-28 MED ORDER — ENOXAPARIN SODIUM 40 MG/0.4ML IJ SOSY
40.0000 mg | PREFILLED_SYRINGE | INTRAMUSCULAR | 0 refills | Status: DC
Start: 2023-11-29 — End: 2023-12-19

## 2023-11-28 MED ORDER — INSULIN ASPART 100 UNIT/ML IJ SOLN
4.0000 [IU] | Freq: Three times a day (TID) | INTRAMUSCULAR | Status: DC
  Administered 2023-11-28 – 2023-11-29 (×3): 4 [IU] via SUBCUTANEOUS

## 2023-11-28 MED ORDER — INSULIN ASPART 100 UNIT/ML IJ SOLN
0.0000 [IU] | Freq: Three times a day (TID) | INTRAMUSCULAR | Status: DC
  Administered 2023-11-28: 5 [IU] via SUBCUTANEOUS
  Administered 2023-11-28 – 2023-11-29 (×2): 2 [IU] via SUBCUTANEOUS

## 2023-11-28 MED ORDER — POLYETHYLENE GLYCOL 3350 17 G PO PACK
17.0000 g | PACK | Freq: Two times a day (BID) | ORAL | Status: AC
  Administered 2023-11-28 – 2023-11-29 (×3): 17 g via ORAL
  Filled 2023-11-28 (×3): qty 1

## 2023-11-28 NOTE — Evaluation (Signed)
 Physical Therapy Evaluation Patient Details Name: Margaret Norman MRN: [id number] DOB: [birth year] Today's Date: 11/28/2023  History of Present Illness  73 yo female presents to ED 6/1 for fall with resultant L intertrochanteric femur fx. S/p ORIF 6/4.  PMH: nonischemic cardiomyopathy EF 45%, OA, CHF, depression, gallstones, kidney stones, HTN, kidney mass, LBBB, PNA, type II DM, R breast cancer with breast lumpectomy and partial mastectomy with radioactive seed, abdominal wall hernia repair, knee arthroscopy, splenectomy.  Clinical Impression   Pt presents with LLE pain, impaired balance with history of falls, impaired activity tolerance, and antalgic gait. Pt to benefit from acute PT to address deficits. Pt ambulated room distance with use of RW, overall currently requiring light assist to contact guard for safety. PT anticipates pt can progress to supervision level while acute, recommend HHPT and support from family at d/c. PT to progress mobility as tolerated, and will continue to follow acutely.          If plan is discharge home, recommend the following: A little help with walking and/or transfers;A little help with bathing/dressing/bathroom   Can travel by private vehicle        Equipment Recommendations None recommended by PT (daughter ordered pt a toilet riser and lift chair)  Recommendations for Other Services       Functional Status Assessment Patient has had a recent decline in their functional status and demonstrates the ability to make significant improvements in function in a reasonable and predictable amount of time.     Precautions / Restrictions Precautions Precautions: Fall Restrictions Weight Bearing Restrictions Per Provider Order: No LLE Weight Bearing Per Provider Order: Weight bearing as tolerated      Mobility  Bed Mobility Overal bed mobility: Needs Assistance Bed Mobility: Supine to Sit     Supine to sit: Min assist, HOB elevated, Used rails      General bed mobility comments: assist for LLE progression, step-wise sequencing with cues for technique    Transfers Overall transfer level: Needs assistance Equipment used: Rolling walker (2 wheels) Transfers: Sit to/from Stand Sit to Stand: Contact guard assist           General transfer comment: slow to rise, cues for correct hand placement    Ambulation/Gait Ambulation/Gait assistance: Contact guard assist Gait Distance (Feet): 20 Feet Assistive device: Rolling walker (2 wheels) Gait Pattern/deviations: Antalgic, Trunk flexed, Step-to pattern, Decreased step length - left, Decreased stance time - left Gait velocity: decr     General Gait Details: close guard for safety, cues for upright posture, placement in RW, sequencing to offweight LLE as needed.  Stairs            Wheelchair Mobility     Tilt Bed    Modified Rankin (Stroke Patients Only)       Balance Overall balance assessment: Needs assistance, History of Falls Sitting-balance support: No upper extremity supported, Feet supported Sitting balance-Leahy Scale: Fair     Standing balance support: Bilateral upper extremity supported, During functional activity, Reliant on assistive device for balance Standing balance-Leahy Scale: Poor                               Pertinent Vitals/Pain Pain Assessment Pain Assessment: Faces Faces Pain Scale: Hurts little more Pain Location: L hip, in standing Pain Descriptors / Indicators: Sore, Discomfort Pain Intervention(s): Limited activity within patient's tolerance, Monitored during session, Repositioned    Home Living Family/patient expects to  be discharged to:: Private residence Living Arrangements: Spouse/significant other;Children Available Help at Discharge: Family Type of Home: House Home Access: Ramped entrance       Home Layout: One level Home Equipment: Cane - single Librarian, academic (2 wheels);Rollator (4 wheels);Shower  seat;Grab bars - tub/shower      Prior Function Prior Level of Function : Independent/Modified Independent;Driving             Mobility Comments: intermittent use of RW; this was pt's first fall in the past 6 months ADLs Comments: independent ADLs and IADLs     Extremity/Trunk Assessment   Upper Extremity Assessment Upper Extremity Assessment: Defer to OT evaluation    Lower Extremity Assessment Lower Extremity Assessment: Generalized weakness;LLE deficits/detail LLE Deficits / Details: anticipated post-operative pain and weakness; able to perform ankle pumps, quad set, active assist heel slide. LLE: Unable to fully assess due to pain    Cervical / Trunk Assessment Cervical / Trunk Assessment: Normal  Communication   Communication Communication: No apparent difficulties    Cognition Arousal: Alert Behavior During Therapy: WFL for tasks assessed/performed   PT - Cognitive impairments: No apparent impairments                         Following commands: Intact       Cueing Cueing Techniques: Verbal cues     General Comments      Exercises     Assessment/Plan    PT Assessment Patient needs continued PT services  PT Problem List Decreased strength;Decreased mobility;Decreased activity tolerance;Decreased balance;Decreased knowledge of use of DME;Pain;Decreased safety awareness       PT Treatment Interventions DME instruction;Therapeutic activities;Gait training;Therapeutic exercise;Patient/family education;Balance training;Functional mobility training;Neuromuscular re-education    PT Goals (Current goals can be found in the Care Plan section)  Acute Rehab PT Goals Patient Stated Goal: go home PT Goal Formulation: With patient Time For Goal Achievement: 12/12/23 Potential to Achieve Goals: Good    Frequency Min 2X/week     Co-evaluation               AM-PAC PT "6 Clicks" Mobility  Outcome Measure Help needed turning from your back  to your side while in a flat bed without using bedrails?: A Little Help needed moving from lying on your back to sitting on the side of a flat bed without using bedrails?: A Little Help needed moving to and from a bed to a chair (including a wheelchair)?: A Little Help needed standing up from a chair using your arms (e.g., wheelchair or bedside chair)?: A Little Help needed to walk in hospital room?: A Little Help needed climbing 3-5 steps with a railing? : A Lot 6 Click Score: 17    End of Session Equipment Utilized During Treatment: Gait belt Activity Tolerance: Patient tolerated treatment well;Patient limited by pain Patient left: in chair;with call bell/phone within reach;with chair alarm set;with family/visitor present Nurse Communication: Mobility status PT Visit Diagnosis: Other abnormalities of gait and mobility (R26.89);Muscle weakness (generalized) (M62.81)    Time: 1010-1041 PT Time Calculation (min) (ACUTE ONLY): 31 min   Charges:   PT Evaluation $PT Eval Low Complexity: 1 Low PT Treatments $Gait Training: 8-22 mins PT General Charges $$ ACUTE PT VISIT: 1 Visit         Shirlene Doughty, PT DPT Acute Rehabilitation Services Secure Chat Preferred  Office [phone]   Knute Mazzuca E Stroup 11/28/2023, 2:06 PM

## 2023-11-28 NOTE — TOC Initial Note (Signed)
 Transition of Care (TOC) - Initial/Assessment Note   Spoke to patient and daughter at bedside. PAtient from home with daughter, grandson and spouse. She has a walker, rolator, cane, shower chair at home. Daughter getting toilet riser and a lift chair   PT recommending HHPT. No preference   [facility] accepted referral . Asked MD to sign order and for face to face  Patient Details  Name: Margaret Norman MRN: [id number] Date of Birth: 1950/10/09  Transition of Care [facility]) CM/SW Contact:    Terre Ferri, RN Phone Number: 11/28/2023, 3:34 PM  Clinical Narrative:                   Expected Discharge Plan: Home w Home Health Services Barriers to Discharge: Continued Medical Work up   Patient Goals and CMS Choice Patient states their goals for this hospitalization and ongoing recovery are:: to return to home CMS Medicare.gov Compare Post Acute Care list provided to:: Patient Choice offered to / list presented to : Patient      Expected Discharge Plan and Services In-house Referral: NA Discharge Planning Services: CM Consult Post Acute Care Choice: Home Health Living arrangements for the past 2 months: Single Family Home                 DME Arranged: N/A DME Agency: NA       HH Arranged: PT HH Agency: [facility] Date HH Agency Contacted: 11/28/23 Time HH Agency Contacted: 1533 Representative spoke with at [facility] Agency: Senaida Dama  Prior Living Arrangements/Services Living arrangements for the past 2 months: Single Family Home Lives with:: Adult Children, Spouse Patient language and need for interpreter reviewed:: Yes Do you feel safe going back to the place where you live?: Yes      Need for Family Participation in Patient Care: Yes (Comment) Care giver support system in place?: Yes (comment) Current home services: DME Criminal Activity/Legal Involvement Pertinent to Current Situation/Hospitalization: No - Comment as needed  Activities of Daily  Living   ADL Screening (condition at time of admission) Independently performs ADLs?: Yes (appropriate for developmental age) Is the patient deaf or have difficulty hearing?: No Does the patient have difficulty seeing, even when wearing glasses/contacts?: No Does the patient have difficulty concentrating, remembering, or making decisions?: No  Permission Sought/Granted   Permission granted to share information with : Yes, Verbal Permission Granted  Share Information with NAME: daughter Maureen Sour  Permission granted to share info w AGENCY: [facility]        Emotional Assessment Appearance:: Appears stated age Attitude/Demeanor/Rapport: Engaged Affect (typically observed): Appropriate Orientation: : Oriented to Self, Oriented to Place, Oriented to  Time, Oriented to Situation Alcohol / Substance Use: Not Applicable Psych Involvement: No (comment)  Admission diagnosis:  Hip fracture (HCC) [S72.009A] Closed fracture of left hip, initial encounter (HCC) [S72.002A] Patient Active Problem List   Diagnosis Date Noted   Nondisplaced intertrochanteric fracture of left femur, initial encounter for closed fracture (HCC) 11/24/2023   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   Port-A-Cath in place 06/10/2019   Breast cancer (HCC) 04/15/2019   Second degree AV block    Malignant neoplasm of upper-outer quadrant of right breast in female, estrogen receptor positive (HCC) 03/25/2019   Thyromegaly 01/10/2018   Overweight (BMI 25.0-29.9) 08/13/2017   Upper respiratory tract infection 07/30/2017   Type 2 diabetes mellitus with hyperglycemia, with long-term current use of insulin  (HCC) 03/19/2017   Thiamine  deficiency 01/04/2017   Tinnitus aurium, 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  Deficiency anemia 04/30/2016   Routine general medical examination at a health care facility 04/27/2015   Abdominal wall hernia 04/26/2015   Family history of hemochromatosis 09/13/2014   Acute maxillary sinusitis 05/14/2014   History  of colonic polyps 03/10/2014   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   Lumbosacral spondylosis without myelopathy 05/12/2013   Insomnia, persistent 02/04/2013   Obesity (BMI 30-39.9) 02/04/2013   Asthma with exacerbation 09/22/2012   Visit for screening mammogram 07/04/2012   Nephrolithiasis    HTN (hypertension)    Gallstones    LBBB (left bundle branch block)    Leukocytosis 12/20/2008   Hyperlipidemia with target LDL less than 100 09/04/2007   Chronic depression 09/04/2007   GERD 09/04/2007   PCP:  Adra Alanis, FNP Pharmacy:   [facility] - [place], [place] - [address] [place] [address] Phone: [phone] Fax: [phone]  [facility]) - [place], [place] - [address] [address] [place] [address] [place] [address] Phone: [phone] Fax: [phone]  [facility] [place], [place] - [address] [place] [address] Phone: [phone] Fax: [phone]     Social Drivers of Health (SDOH) Social History: SDOH Screenings   Food Insecurity: No Food Insecurity (11/24/2023)  Housing: Low Risk  (11/24/2023)  Transportation Needs: No Transportation Needs (11/24/2023)  Utilities: Not At Risk (11/24/2023)  Alcohol Screen: Low Risk  (12/18/2022)  Depression (PHQ2-9): High Risk (12/18/2022)  Financial Resource Strain: Low Risk  (04/26/2023)  Physical Activity: Inactive (04/26/2023)  Social Connections: Moderately Isolated (11/24/2023)  Stress: Stress Concern Present (04/26/2023)  Tobacco Use: Low Risk  (11/24/2023)   SDOH Interventions:     Readmission Risk Interventions    11/25/2023   12:34 PM  Readmission Risk Prevention Plan  Transportation Screening Complete  PCP or Specialist Appt within 5-7 Days Complete  Home Care Screening Complete  Medication Review (RN CM) Complete

## 2023-11-28 NOTE — Plan of Care (Signed)
  Problem: Health Behavior/Discharge Planning: Goal: Ability to identify and utilize available resources and services will improve Outcome: Progressing   Problem: Health Behavior/Discharge Planning: Goal: Ability to manage health-related needs will improve Outcome: Progressing   Problem: Metabolic: Goal: Ability to maintain appropriate glucose levels will improve Outcome: Progressing

## 2023-11-28 NOTE — Progress Notes (Signed)
 [facility] PROGRESS NOTE    Progress Note  Margaret Norman  ZOX:[id number] DOB: Apr 22, 1951 DOA: 11/24/2023 PCP: Adra Alanis, FNP     Brief Narrative:   Margaret Norman is an 73 y.o. 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  Assessment/Plan:   Hip fracture requiring operative repair, left, closed, initial encounter [facility]) MRI of the left hip showed nondisplaced intertrochanteric fracture of the left proximal femur with severe osteoarthritis of the left hip Orthopedic surgery was consulted she status post ORIF on 11/27/2023. Continue narcotics for pain. Placed on a bowel regimen. PT OT evaluation is pending. Anticoagulation and analgesics per orthopedic surgery.  Type 2 diabetes mellitus with hyperglycemia, with long-term current use of insulin  (HCC) Glucose elevated continue long-acting insulin  change sliding scale with meal coverage.  Leukocytosis: Probably reactive likely due to fractures  Nonischemic cardiomyopathy due to Herceptin : Continue beta-blockers.  Blood pressure is well-controlled around 163. Follow-up with cardiology as an outpatient. Continue to hold Entresto .  Normocytic anemia: Noted continue to monitor closely.  Thrombocytosis: Likely reactive.    DVT prophylaxis: lovenox  Family Communication:none Status is: Inpatient Remains inpatient appropriate because: Left hip fracture    Code Status:  Code Status History     Date Active Date Inactive Code Status Order ID Comments User Context   09/28/2019 0834 09/28/2019 1603 Full Code [id number]  Darlis Eisenmenger, MD Inpatient   04/15/2019 1456 04/16/2019 1526 Full Code [id number]  Oza Blumenthal, MD  Inpatient         IV Access:   Peripheral IV   Procedures and diagnostic studies:   DG HIP UNILAT W OR W/O PELVIS 2-3 VIEWS LEFT Result Date: 11/27/2023 CLINICAL DATA:  [id number] History of open reduction and internal fixation (ORIF) procedure [id number] EXAM: DG HIP (WITH OR WITHOUT PELVIS) 2-3V LEFT COMPARISON:  Preoperative imaging FINDINGS: Lateral plate with trans trochanteric and sub trochanteric screw fixation of proximal femur fracture. The fracture is not well visualized by radiograph. No periprosthetic lucency. Recent postsurgical change includes air and edema in the soft tissues. Lateral skin staples in place. IMPRESSION: ORIF of proximal femur fracture without immediate postoperative complication. Electronically Signed   By: Chadwick Colonel M.D.   On: 11/27/2023 16:22   DG FEMUR MIN 2 VIEWS LEFT Result Date: 11/27/2023 CLINICAL DATA:  Open reduction internal fixation. EXAM: LEFT FEMUR 2 VIEWS COMPARISON:  Left hip x-ray 11/24/2023. FINDINGS: Two intraoperative fluoroscopic views of the left hip were submitted. There is a new left hip screw in place. Alignment is anatomic. Fluoroscopy time: 1 minute 2 seconds. Fluoroscopy dose: 8.77 micro gray. IMPRESSION: Intraoperative fluoroscopic views of the left hip. Electronically Signed   By: Tyron Gallon M.D.   On: 11/27/2023 15:23   DG C-Arm 1-60 Min-No Report Result Date: 11/27/2023 Fluoroscopy was utilized by the requesting physician.  No radiographic interpretation.     Medical Consultants:   None.   Subjective:    Margaret Norman she relates her pain is controlled she only needed Tylenol  this morning  Objective:    Vitals:   11/27/23 2136 11/27/23 2337 11/28/23 0527 11/28/23 0748  BP: (!) 101/58 99/62 (!) 95/56 108/63  Pulse: 80 81 75 85  Resp: 18 16 18 16   Temp: 97.6 F (36.4 C) 97.8 F (36.6  C) 97.6 F (36.4 C) 98 F (36.7 C)  TempSrc: Oral Oral Oral   SpO2: 96% 97% 97% 96%  Weight:      Height:       SpO2:  96 % O2 Flow Rate (L/min): 2 L/min   Intake/Output Summary (Last 24 hours) at 11/28/2023 0801 Last data filed at 11/28/2023 0400 Gross per 24 hour  Intake 800 ml  Output 800 ml  Net 0 ml   Filed Weights   11/24/23 1638 11/27/23 1325  Weight: 60.3 kg 58.1 kg    Exam: General exam: In no acute distress. Respiratory system: Good air movement and clear to auscultation. Cardiovascular system: S1 & S2 heard, RRR. No JVD. Gastrointestinal system: Abdomen is nondistended, soft and nontender.  Extremities: No pedal edema. Skin: No rashes, lesions or ulcers Psychiatry: Judgement and insight appear normal. Mood & affect appropriate. Data Reviewed:    Labs: Basic Metabolic Panel: Recent Labs  Lab 11/24/23 1718 11/25/23 1441 11/26/23 0323 11/27/23 0545 11/27/23 1954 11/28/23 0558  NA 139  --  137 137  --  135  K 3.8  --  3.8 3.8  --  4.4  CL 106  --  106 105  --  100  CO2 18*  --  24 23  --  24  GLUCOSE 139*  --  134* 144*  --  223*  BUN 36*  --  22 22  --  18  CREATININE 1.23* 0.78 0.68 0.89 1.03* 1.00  CALCIUM  9.5  --  9.2 9.1  --  9.0   GFR Estimated Creatinine Clearance: 39.6 mL/min (by C-G formula based on SCr of 1 mg/dL). Liver Function Tests: Recent Labs  Lab 11/24/23 1718 11/26/23 0323 11/27/23 0545  AST 20 13* 16  ALT 15 11 10   ALKPHOS 60 51 45  BILITOT 0.8 0.6 0.5  PROT 7.0 6.0* 5.7*  ALBUMIN 4.1 3.5 3.0*   No results for input(s): "LIPASE", "AMYLASE" in the last 168 hours. No results for input(s): "AMMONIA" in the last 168 hours. Coagulation profile No results for input(s): "INR", "PROTIME" in the last 168 hours. COVID-19 Labs  No results for input(s): "DDIMER", "FERRITIN", "LDH", "CRP" in the last 72 hours.  Lab Results  Component Value Date   SARSCOV2NAA NEGATIVE 06/30/2021   SARSCOV2NAA NEGATIVE 04/21/2020   SARSCOV2NAA NEGATIVE 09/25/2019   SARSCOV2NAA NOT DETECTED 04/11/2019    CBC: Recent Labs  Lab 11/24/23 1718 11/25/23 1441  11/26/23 0323 11/27/23 0545 11/27/23 1954 11/28/23 0558  WBC 29.1* 22.2* 16.7* 14.2* 22.2* 16.4*  NEUTROABS 23.4*  --   --   --   --   --   HGB 11.6* 11.0* 10.7* 10.6* 10.9* 10.4*  HCT 37.5 34.7* 34.0* 32.5* 33.4* 31.7*  MCV 95.9 93.8 92.4 91.5 90.5 91.1  PLT 473* 410* 442* 423* 413* 420*   Cardiac Enzymes: No results for input(s): "CKTOTAL", "CKMB", "CKMBINDEX", "TROPONINI" in the last 168 hours. BNP (last 3 results) No results for input(s): "PROBNP" in the last 8760 hours. CBG: Recent Labs  Lab 11/27/23 1114 11/27/23 1528 11/27/23 1722 11/27/23 2140 11/28/23 0623  GLUCAP 132* 133* 174* 229* 218*   D-Dimer: No results for input(s): "DDIMER" in the last 72 hours. Hgb A1c: No results for input(s): "HGBA1C" in the last 72 hours. Lipid Profile: No results for input(s): "CHOL", "HDL", "LDLCALC", "TRIG", "CHOLHDL", "LDLDIRECT" in the last 72 hours. Thyroid  function studies: No results for input(s): "TSH", "T4TOTAL", "T3FREE", "THYROIDAB" in the last 72 hours.  Invalid input(s): "  FREET3" Anemia work up: No results for input(s): "VITAMINB12", "FOLATE", "FERRITIN", "TIBC", "IRON", "RETICCTPCT" in the last 72 hours. Sepsis Labs: Recent Labs  Lab 11/26/23 0323 11/27/23 0545 11/27/23 1954 11/28/23 0558  WBC 16.7* 14.2* 22.2* 16.4*   Microbiology Recent Results (from the past 240 hours)  Surgical PCR screen     Status: None   Collection Time: 11/24/23 11:58 PM   Specimen: Nasal Mucosa; Nasal Swab  Result Value Ref Range Status   MRSA, PCR NEGATIVE NEGATIVE Final   Staphylococcus aureus NEGATIVE NEGATIVE Final    Comment: (NOTE) The Xpert SA Assay (FDA approved for NASAL specimens in patients 46 years of age and older), is one component of a comprehensive surveillance program. It is not intended to diagnose infection nor to guide or monitor treatment. Performed at [facility], [address], [place], [place] [address]   Urine Culture     Status:  Abnormal   Collection Time: 11/25/23 10:20 AM   Specimen: Urine, Random  Result Value Ref Range Status   Specimen Description   Final    URINE, RANDOM Performed at [facility], [address], [place], [place] [address]    Special Requests   Final    NONE Reflexed from [id number] Performed at [facility], [address] [place], [place] [address]    Culture 20,000 COLONIES/mL KLEBSIELLA PNEUMONIAE (A)  Final   Report Status 11/27/2023 FINAL  Final   Organism ID, Bacteria KLEBSIELLA PNEUMONIAE (A)  Final      Susceptibility   Klebsiella pneumoniae - MIC*    AMPICILLIN RESISTANT Resistant     CEFAZOLIN <=4 SENSITIVE Sensitive     CEFEPIME <=0.12 SENSITIVE Sensitive     CEFTRIAXONE <=0.25 SENSITIVE Sensitive     CIPROFLOXACIN  <=0.25 SENSITIVE Sensitive     GENTAMICIN <=1 SENSITIVE Sensitive     IMIPENEM <=0.25 SENSITIVE Sensitive     NITROFURANTOIN 64 INTERMEDIATE Intermediate     TRIMETH /SULFA  <=20 SENSITIVE Sensitive     AMPICILLIN/SULBACTAM <=2 SENSITIVE Sensitive     PIP/TAZO <=4 SENSITIVE Sensitive ug/mL    * 20,000 COLONIES/mL KLEBSIELLA PNEUMONIAE     Medications:    acetaminophen   1,000 mg Oral Q6H   anastrozole   1 mg Oral Daily   buPROPion   300 mg Oral q morning   carvedilol   6.25 mg Oral BID WC   Chlorhexidine  Gluconate Cloth  6 each Topical Daily   cholecalciferol  2,000 Units Oral Daily   docusate sodium  100 mg Oral BID   enoxaparin  (LOVENOX ) injection  40 mg Subcutaneous Q24H   feeding supplement  237 mL Oral BID BM   insulin  aspart  0-6 Units Subcutaneous TID WC   insulin  glargine-yfgn  10 Units Subcutaneous QHS   sertraline   100 mg Oral Daily   Continuous Infusions:    LOS: 4 days   Macdonald Savoy  [facility]  11/28/2023, 8:01 AM

## 2023-11-28 NOTE — Progress Notes (Signed)
 Subjective: 1 Day Post-Op Procedure(s) (LRB): LEFT OPEN REDUCTION INTERNAL FIXATION (ORIF) INTERTROCHANTOR (Left) Patient reports pain as mild.    Objective: Vital signs in last 24 hours: Temp:  [97.6 F (36.4 C)-99.2 F (37.3 C)] 98 F (36.7 C) (06/05 0748) Pulse Rate:  [75-93] 85 (06/05 0748) Resp:  [15-20] 16 (06/05 0748) BP: (95-137)/(53-82) 108/63 (06/05 0748) SpO2:  [85 %-97 %] 96 % (06/05 0748) Weight:  [58.1 kg] 58.1 kg (06/04 1325)  Intake/Output from previous day: 06/04 0701 - 06/05 0700 In: 800 [I.V.:500; IV Piggyback:300] Out: 800 [Urine:700; Blood:100] Intake/Output this shift: Total I/O In: 240 [P.O.:240] Out: 550 [Urine:550]  Recent Labs    11/25/23 1441 11/26/23 0323 11/27/23 0545 11/27/23 1954 11/28/23 0558  HGB 11.0* 10.7* 10.6* 10.9* 10.4*   Recent Labs    11/27/23 1954 11/28/23 0558  WBC 22.2* 16.4*  RBC 3.69* 3.48*  HCT 33.4* 31.7*  PLT 413* 420*   Recent Labs    11/27/23 0545 11/27/23 1954 11/28/23 0558  NA 137  --  135  K 3.8  --  4.4  CL 105  --  100  CO2 23  --  24  BUN 22  --  18  CREATININE 0.89 1.03* 1.00  GLUCOSE 144*  --  223*  CALCIUM  9.1  --  9.0   No results for input(s): "LABPT", "INR" in the last 72 hours.  Neurologically intact Neurovascular intact Sensation intact distally Intact pulses distally Dorsiflexion/Plantar flexion intact Incision: dressing C/D/I No cellulitis present Compartment soft   Assessment/Plan: 1 Day Post-Op Procedure(s) (LRB): LEFT OPEN REDUCTION INTERNAL FIXATION (ORIF) INTERTROCHANTOR (Left) Up with therapy WBAT LLE Lovenox /scds for dvt ppx      Sandie Cross 11/28/2023, 10:20 AM

## 2023-11-28 NOTE — Discharge Instructions (Signed)
    1. Change dressings as needed 2. May shower but keep incisions covered and dry 3. Take your prescribed blood thinner to prevent blood clots 4. Take stool softeners as needed 5. Take pain meds as needed  I have reviewed the patient's history and given the presence of a fragility fracture, I have deemed the necessity of a osteoporosis management referral or confirmed that the patient is currently enrolled in a osteoporosis treatment program.

## 2023-11-28 NOTE — Evaluation (Signed)
 Occupational Therapy Evaluation Patient Details Name: Margaret Norman MRN: [id number] DOB: October 11, 1950 Today's Date: 11/28/2023   History of Present Illness   73 yo female presents to ED 6/1 for fall with resultant L intertrochanteric femur fx. S/p ORIF 6/4.  PMH: nonischemic cardiomyopathy EF 45%, OA, CHF, depression, gallstones, kidney stones, HTN, kidney mass, LBBB, PNA, type II DM, R breast cancer with breast lumpectomy and partial mastectomy with radioactive seed, abdominal wall hernia repair, knee arthroscopy, splenectomy.     Clinical Impressions At baseline, pt is Independent with ADLs and IADLs, performs functional mobility Independent to Mod I with intermittent use of RW, and drives. Pt now presents with decreased balance, pain affecting functional level, decreased B UE strength, decreased standing tolerance, and decreased safety and independence with functional tasks. Pt currently demonstrates ability to complete UB ADLs Independent to Set up/Supervision, LB ADLs with *Min to Mod assist, and functional mobility/transfers with a RW with Contact guard assist. Pt will benefit from acute skilled OT services to address deficits outlined below and to increase safety and independence with functional tasks. Post acute discharge, pt will benefit from [facility] OT to maximize rehab potential.      If plan is discharge home, recommend the following:   A little help with walking and/or transfers;A little help with bathing/dressing/bathroom;Assistance with cooking/housework;Assist for transportation;Help with stairs or ramp for entrance     Functional Status Assessment   Patient has had a recent decline in their functional status and demonstrates the ability to make significant improvements in function in a reasonable and predictable amount of time.     Equipment Recommendations   Tub/shower seat     Recommendations for Other Services         Precautions/Restrictions    Precautions Precautions: Fall Restrictions Weight Bearing Restrictions Per Provider Order: No LLE Weight Bearing Per Provider Order: Weight bearing as tolerated     Mobility Bed Mobility Overal bed mobility: Needs Assistance Bed Mobility: Sit to Supine       Sit to supine: Supervision, Contact guard assist   General bed mobility comments: CGA to support L LE while lifting into bed    Transfers Overall transfer level: Needs assistance Equipment used: Rolling walker (2 wheels) Transfers: Sit to/from Stand, Bed to chair/wheelchair/BSC Sit to Stand: Contact guard assist     Step pivot transfers: Contact guard assist     General transfer comment: slow to rise, cues for correct hand placement      Balance Overall balance assessment: Needs assistance, History of Falls Sitting-balance support: No upper extremity supported, Feet supported Sitting balance-Leahy Scale: Fair     Standing balance support: Bilateral upper extremity supported, During functional activity, Reliant on assistive device for balance Standing balance-Leahy Scale: Poor                             ADL either performed or assessed with clinical judgement   ADL Overall ADL's : Needs assistance/impaired Eating/Feeding: Independent;Sitting   Grooming: Independent;Sitting   Upper Body Bathing: Set up;Supervision/ safety;Sitting   Lower Body Bathing: Minimal assistance;Moderate assistance;Sitting/lateral leans;Sit to/from stand   Upper Body Dressing : Set up;Sitting   Lower Body Dressing: Minimal assistance;Sitting/lateral leans;Sit to/from stand;Cueing for compensatory techniques   Toilet Transfer: Contact guard assist;Ambulation;Rolling walker (2 wheels);Regular Toilet;Grab bars   Toileting- Clothing Manipulation and Hygiene: Minimal assistance;Moderate assistance;Sit to/from stand       Functional mobility during ADLs: Contact guard assist;Rolling walker (2  wheels)       Vision  Baseline Vision/History: 1 Wears glasses Ability to See in Adequate Light: 0 Adequate (with glasses) Patient Visual Report: No change from baseline       Perception         Praxis         Pertinent Vitals/Pain Pain Assessment Pain Assessment: Faces Faces Pain Scale: Hurts little more Pain Location: L hip, in standing and ambulation Pain Descriptors / Indicators: Sore, Discomfort Pain Intervention(s): Limited activity within patient's tolerance, Monitored during session, Repositioned     Extremity/Trunk Assessment Upper Extremity Assessment Upper Extremity Assessment: Right hand dominant;Overall WFL for tasks assessed (gross B UE strength 4- to 4/5)   Lower Extremity Assessment Lower Extremity Assessment: Defer to PT evaluation   Cervical / Trunk Assessment Cervical / Trunk Assessment: Normal   Communication Communication Communication: No apparent difficulties   Cognition Arousal: Alert Behavior During Therapy: WFL for tasks assessed/performed Cognition: No apparent impairments             OT - Cognition Comments: AAOx4 and pleasant throughotu session.                 Following commands: Intact       Cueing  General Comments   Cueing Techniques: Verbal cues  Pt's daughter present throughout session   Exercises     Shoulder Instructions      Home Living Family/patient expects to be discharged to:: Private residence Living Arrangements: Spouse/significant other;Children;Other relatives (daughter and grandson) Available Help at Discharge: Family;Available 24 hours/day (pt with another daughter who lives nearby and also able to assist) Type of Home: House Home Access: Ramped entrance     Home Layout: One level     Bathroom Shower/Tub: Tub/shower unit (family is currently installing a walk-in shower)   Bathroom Toilet: Standard     Home Equipment: Cane - single Librarian, academic (2 wheels);Rollator (4 wheels);Shower seat;Grab bars -  tub/shower          Prior Functioning/Environment Prior Level of Function : Independent/Modified Independent;Driving             Mobility Comments: intermittent use of RW; this was pt's first fall in the past 6 months ADLs Comments: independent ADLs and IADLs. Pt enjoys playing games on her phone.    OT Problem List: Decreased strength;Decreased activity tolerance;Impaired balance (sitting and/or standing);Pain;Decreased knowledge of precautions;Decreased knowledge of use of DME or AE   OT Treatment/Interventions: Self-care/ADL training;Therapeutic exercise;Energy conservation;DME and/or AE instruction;Therapeutic activities;Patient/family education;Balance training      OT Goals(Current goals can be found in the care plan section)   Acute Rehab OT Goals Patient Stated Goal: to return home and stay independent OT Goal Formulation: With patient/family Time For Goal Achievement: 12/12/23 Potential to Achieve Goals: Good ADL Goals Pt Will Perform Grooming: with supervision;standing Pt Will Perform Lower Body Bathing: with supervision;sit to/from stand;sitting/lateral leans (with adaptive equipment as needed) Pt Will Perform Lower Body Dressing: with supervision;sitting/lateral leans;sit to/from stand (with adaptive equipment as needed) Pt Will Transfer to Toilet: with supervision;ambulating;regular height toilet (with least restrictive AD) Pt Will Perform Toileting - Clothing Manipulation and hygiene: with supervision;sitting/lateral leans;sit to/from stand Pt/caregiver will Perform Home Exercise Program: Increased strength;Both right and left upper extremity;With theraband;Independently;With written HEP provided   OT Frequency:  Min 2X/week    Co-evaluation              AM-PAC OT "6 Clicks" Daily Activity     Outcome Measure Help from  another person eating meals?: None Help from another person taking care of personal grooming?: None (in sitting) Help from another  person toileting, which includes using toliet, bedpan, or urinal?: A Lot Help from another person bathing (including washing, rinsing, drying)?: A Lot Help from another person to put on and taking off regular upper body clothing?: A Little Help from another person to put on and taking off regular lower body clothing?: A Little 6 Click Score: 18   End of Session Equipment Utilized During Treatment: Rolling walker (2 wheels);Gait belt Nurse Communication: Mobility status  Activity Tolerance: Patient tolerated treatment well Patient left: in bed;with call bell/phone within reach;with bed alarm set;with family/visitor present  OT Visit Diagnosis: Unsteadiness on feet (R26.81);Other abnormalities of gait and mobility (R26.89);Muscle weakness (generalized) (M62.81);History of falling (Z91.81);Other (comment);Pain (decreased activity tolerance)                Time: 1610-9604 OT Time Calculation (min): 22 min Charges:  OT General Charges $OT Visit: 1 Visit OT Evaluation $OT Eval Low Complexity: 1 Low  Kerin Cecchi "Kyle" M., OTR/L, MA Acute Rehab [phone]   Walt Gunner 11/28/2023, 9:08 PM

## 2023-11-28 NOTE — TOC CAGE-AID Note (Signed)
 Transition of Care [facility]) - CAGE-AID Screening   Patient Details  Name: Margaret Norman MRN: [id number] Date of Birth: 1951-02-03  [facility] CM/SW Contact:    Izzak Fries E Aaliya Maultsby, LCSW Phone Number: 11/28/2023, 9:55 AM   Clinical Narrative:    CAGE-AID Screening:    Have You Ever Felt You Ought to Cut Down on Your Drinking or Drug Use?: No Have People Annoyed You By [employer] Your Drinking Or Drug Use?: No Have You Felt Bad Or Guilty About Your Drinking Or Drug Use?: No Have You Ever Had a Drink or Used Drugs First Thing In The Morning to Steady Your Nerves or to Get Rid of a Hangover?: No CAGE-AID Score: 0  Substance Abuse Education Offered: No

## 2023-11-29 DIAGNOSIS — E1165 Type 2 diabetes mellitus with hyperglycemia: Secondary | ICD-10-CM | POA: Diagnosis not present

## 2023-11-29 DIAGNOSIS — Z794 Long term (current) use of insulin: Secondary | ICD-10-CM | POA: Diagnosis not present

## 2023-11-29 DIAGNOSIS — S72145A Nondisplaced intertrochanteric fracture of left femur, initial encounter for closed fracture: Secondary | ICD-10-CM | POA: Diagnosis not present

## 2023-11-29 LAB — BASIC METABOLIC PANEL WITH GFR
Anion gap: 12 (ref 5–15)
BUN: 24 mg/dL — ABNORMAL HIGH (ref 8–23)
CO2: 22 mmol/L (ref 22–32)
Calcium: 9.1 mg/dL (ref 8.9–10.3)
Chloride: 105 mmol/L (ref 98–111)
Creatinine, Ser: 0.9 mg/dL (ref 0.44–1.00)
GFR, Estimated: >60 mL/min (ref >60)
Glucose, Bld: 136 mg/dL — ABNORMAL HIGH (ref 70–99)
Potassium: 4.2 mmol/L (ref 3.5–5.1)
Sodium: 139 mmol/L (ref 135–145)

## 2023-11-29 LAB — CBC
HCT: 30.6 % — ABNORMAL LOW (ref 36.0–46.0)
Hemoglobin: 10 g/dL — ABNORMAL LOW (ref 12.0–15.0)
MCH: 29.9 pg (ref 26.0–34.0)
MCHC: 32.7 g/dL (ref 30.0–36.0)
MCV: 91.3 fL (ref 80.0–100.0)
Platelets: 442 10*3/uL — ABNORMAL HIGH (ref 150–400)
RBC: 3.35 MIL/uL — ABNORMAL LOW (ref 3.87–5.11)
RDW: 13.3 % (ref 11.5–15.5)
WBC: 17.7 10*3/uL — ABNORMAL HIGH (ref 4.0–10.5)
nRBC: 0 % (ref 0.0–0.2)

## 2023-11-29 LAB — GLUCOSE, CAPILLARY: Glucose-Capillary: 130 mg/dL — ABNORMAL HIGH (ref 70–99)

## 2023-11-29 NOTE — Plan of Care (Signed)
  Problem: Metabolic: Goal: Ability to maintain appropriate glucose levels will improve Outcome: Progressing   Problem: Skin Integrity: Goal: Risk for impaired skin integrity will decrease Outcome: Progressing   Problem: Activity: Goal: Risk for activity intolerance will decrease Outcome: Progressing

## 2023-11-29 NOTE — Discharge Summary (Signed)
 Physician Discharge Summary  Margaret Norman UJW:[id number] DOB: 1950-12-14 DOA: 11/24/2023  PCP: Adra Alanis, FNP  Admit date: 11/24/2023 Discharge date: 11/29/2023  Admitted From: Home Disposition:  Home  Recommendations for Outpatient Follow-up:  Follow up with PCP in 1-2 weeks Please obtain BMP/CBC in one week   Home Health:Yes Equipment/Devices:None  Discharge Condition:Stable CODE STATUS:Full Diet recommendation: Heart Healthy  Brief/Interim Summary: 73 y.o. 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   Discharge Diagnoses:  Principal Problem:   Nondisplaced intertrochanteric fracture of left femur, initial encounter for closed fracture [facility]) Active Problems:   Leukocytosis   Type 2 diabetes mellitus with hyperglycemia, with long-term current use of insulin  (HCC)  Acute hip fracture requiring operative repair: MRI of the left hip showed nondisplaced and is intact x-ray of the left proximal femur. Orthopedic surgery was consulted she status post ORIF on 11/27/2023. She required minimal narcotics physical therapy evaluated the patient will go home with home health PT. She will go on Lovenox  for DVT prophylaxis.  Diabetes mellitus type 2: Essentially made to his medication.  Exact his insulin  Likely reactive now improved.  Nonischemic cardiomyopathy due to Herceptin : Plan no changes made to her medication she can resume them as an outpatient.  Normocytic anemia: Hemoglobin remained stable.  Discharge Instructions  Discharge Instructions     Diet - low sodium heart healthy   Complete by: As directed    Increase activity slowly   Complete by: As directed       Allergies  as of 11/29/2023       Reactions   Invokana  [canagliflozin ] Other (See Comments)   UTI's   Amoxicillin  Other (See Comments)   Sore tongue Did it involve swelling of the face/tongue/throat, SOB, or low BP? No Did it involve sudden or severe rash/hives, skin peeling, or any reaction on the inside of your mouth or nose? No Did you need to seek medical attention at a hospital or doctor's office? No When did it last happen?      5-10 years ago If all above answers are "NO", may proceed with cephalosporin use.        Medication List     TAKE these medications    acetaminophen  500 MG tablet Commonly known as: TYLENOL  Take 1,000 mg by mouth every 6 (six) hours as needed for moderate pain.   anastrozole  1 MG tablet Commonly known as: ARIMIDEX  Take 1 tablet (1 mg total) by mouth daily.   aspirin EC 81 MG tablet Take 81 mg by mouth daily.   buPROPion  300 MG 24 hr tablet Commonly known as: WELLBUTRIN  XL Take 1 tablet (300 mg total) by mouth every morning.   carvedilol  12.5 MG tablet Commonly known as: COREG  Take 1 tablet (12.5 mg total) by mouth 2 (two) times daily with a meal.   Easy Comfort Pen Needles 32G X 4 MM Misc Generic drug: Insulin  Pen Needle USE AS DIRECTED EVERY DAY   enoxaparin  40 MG/0.4ML injection Commonly known as: LOVENOX  Inject 0.4 mLs (40 mg total) into the skin daily for 14 doses.   Entresto  24-26 MG Generic drug: sacubitril -valsartan  TAKE 1 TABLET BY MOUTH TWICE DAILY   FreeStyle Libre 2 Reader Devi 1 each by Does not apply route daily.   FreeStyle Zoar 2  Sensor Misc APPLY AS DIRECTED EVERY 14 DAYS   furosemide  40 MG tablet Commonly known as: LASIX  Take 1 tablet (40 mg total) by mouth as needed. For weight gain of 3ln in 24 hours or 5lb in a week   HYDROcodone -acetaminophen  5-325 MG tablet Commonly known as: NORCO/VICODIN Take 1 tablet by mouth every 6 (six) hours as needed for moderate pain (pain score 4-6).   Jardiance  25 MG Tabs  tablet Generic drug: empagliflozin  TAKE 1 TABLET BY MOUTH DAILY   Lantus  SoloStar 100 UNIT/ML Solostar Pen Generic drug: insulin  glargine INJECT SUBCUTANEOUSLY 20 UNITS AT BEDTIME   meclizine  25 MG tablet Commonly known as: ANTIVERT  Take 25 mg by mouth 3 (three) times daily as needed for dizziness.   metFORMIN  1000 MG tablet Commonly known as: GLUCOPHAGE  TAKE 1 TABLET BY MOUTH TWICE DAILY WITH A MEAL   omeprazole  40 MG capsule Commonly known as: PRILOSEC TAKE 1 CAPSULE BY MOUTH 30-60 MINUTES PRIOR TO BREAKFAST AND DINNER   PROBIOTIC ADVANCED PO Take 1 tablet by mouth in the morning and at bedtime.   repaglinide  1 MG tablet Commonly known as: PRANDIN  Take 1 tablet (1 mg total) by mouth daily before supper.   rosuvastatin  10 MG tablet Commonly known as: CRESTOR  TAKE 1 TABLET BY MOUTH DAILY   sertraline  100 MG tablet Commonly known as: ZOLOFT  Take 1 tablet (100 mg total) by mouth daily.   spironolactone  25 MG tablet Commonly known as: ALDACTONE  TAKE 1 TABLET BY MOUTH DAILY   SUPER B COMPLEX PO Take 1 tablet by mouth daily.   Trulicity  3 MG/0.5ML Soaj Generic drug: Dulaglutide  Inject 3 mg into the skin once a week. What changed: additional instructions   Vitamin D  50 MCG (2000 UT) tablet Take 2,000 Units by mouth daily.   Vitamin E 180 MG Caps Take 180 mg by mouth at bedtime.   zinc  gluconate 50 MG tablet Take 50 mg by mouth daily.        Follow-up Information     Sandie Cross, PA-C. Schedule an appointment as soon as possible for a visit in 2 week(s).   Specialty: Orthopedic Surgery Contact information: [address] [place] [address] [phone]         [facility]  Follow up.   Contact information: [address] [place] [address] [phone]                Allergies  Allergen Reactions   Invokana  [Canagliflozin ] Other (See Comments)    UTI's   Amoxicillin  Other (See Comments)    Sore  tongue Did it involve swelling of the face/tongue/throat, SOB, or low BP? No Did it involve sudden or severe rash/hives, skin peeling, or any reaction on the inside of your mouth or nose? No Did you need to seek medical attention at a hospital or doctor's office? No When did it last happen?      5-10 years ago If all above answers are "NO", may proceed with cephalosporin use.     Consultations: Orthopedic surgery   Procedures/Studies: DG HIP UNILAT W OR W/O PELVIS 2-3 VIEWS LEFT Result Date: 11/27/2023 CLINICAL DATA:  [id number] History of open reduction and internal fixation (ORIF) procedure [id number] EXAM: DG HIP (WITH OR WITHOUT PELVIS) 2-3V LEFT COMPARISON:  Preoperative imaging FINDINGS: Lateral plate with trans trochanteric and sub trochanteric screw fixation of proximal femur fracture. The fracture is not well visualized by radiograph. No periprosthetic lucency. Recent postsurgical change includes air and edema  in the soft tissues. Lateral skin staples in place. IMPRESSION: ORIF of proximal femur fracture without immediate postoperative complication. Electronically Signed   By: Chadwick Colonel M.D.   On: 11/27/2023 16:22   DG FEMUR MIN 2 VIEWS LEFT Result Date: 11/27/2023 CLINICAL DATA:  Open reduction internal fixation. EXAM: LEFT FEMUR 2 VIEWS COMPARISON:  Left hip x-ray 11/24/2023. FINDINGS: Two intraoperative fluoroscopic views of the left hip were submitted. There is a new left hip screw in place. Alignment is anatomic. Fluoroscopy time: 1 minute 2 seconds. Fluoroscopy dose: 8.77 micro gray. IMPRESSION: Intraoperative fluoroscopic views of the left hip. Electronically Signed   By: Tyron Gallon M.D.   On: 11/27/2023 15:23   DG C-Arm 1-60 Min-No Report Result Date: 11/27/2023 Fluoroscopy was utilized by the requesting physician.  No radiographic interpretation.   DG CHEST PORT 1 VIEW Result Date: 11/25/2023 CLINICAL DATA:  Congestive heart failure EXAM: PORTABLE CHEST 1 VIEW COMPARISON:   Chest radiograph dated 04/26/2023 FINDINGS: Patient is rotated to the right. Normal lung volumes. No focal consolidations. No pleural effusion or pneumothorax. Enlarged cardiomediastinal silhouette is likely projectional. Degenerative changes of bilateral shoulders. Right upper quadrant surgical clips. Right breast surgical clips. IMPRESSION: 1.  No focal consolidations. 2. Enlarged cardiomediastinal silhouette is likely projectional. Electronically Signed   By: Ula Gambler M.D.   On: 11/25/2023 15:13   MR HIP LEFT WO CONTRAST Result Date: 11/24/2023 CLINICAL DATA:  Left hip pain after fall. EXAM: MR OF THE LEFT HIP WITHOUT CONTRAST TECHNIQUE: Multiplanar, multisequence MR imaging was performed. No intravenous contrast was administered. COMPARISON:  Left hip radiographs dated 11/24/2023. FINDINGS: Soft tissue and Muscle: Muscle bulk is relatively symmetric bilaterally. Edema of the left quadratus femoris muscle and visualized left vastus intermedius muscle, most compatible with posttraumatic strain/contusion. Mild edema of the left adductor musculature.Hamstring tendon origins are intact.Partial-thickness undersurface tears of the left gluteus medius and minimus cuff insertion with tendinosis.Visualized intrapelvic contents are grossly unremarkable. Bones/Hip: Nondisplaced intertrochanteric fracture of the left proximal femur with associated edema. Fracture margins extend through the greater and lesser trochanters. Severe osteoarthritis of the left hip with joint space narrowing, marginal osteophytosis and patchy subchondral marrow edema of the femoral head and acetabulum. Nondisplaced subchondral fracture of the left superior femoral head can not be entirely excluded. No hip joint effusion. Labrum appears grossly intact, although evaluation is limited due to lack of intra-articular fluid/contrast. Transverse ligaments and ligamentum teres are appear intact. Sacroiliac joints and pubic symphysis are anatomically  aligned. Degenerative changes of the visualized lower lumbar spine. Moderate-to-severe osteoarthritis of the right hip. IMPRESSION: 1. Nondisplaced intertrochanteric fracture of the left proximal femur. 2. Severe osteoarthritis of the left hip, as above. Nondisplaced subchondral fracture of the left superior femoral head can not be entirely excluded. No hip joint effusion. 3. Edema of the left quadratus femoris muscle and visualized left vastus intermedius muscle, most compatible with posttraumatic strain/contusion. 4. Partial-thickness undersurface tears of the left gluteus medius and minimus cuff insertion with tendinosis. Electronically Signed   By: Mannie Seek M.D.   On: 11/24/2023 19:20   DG Hip Unilat W or Wo Pelvis 2-3 Views Left Result Date: 11/24/2023 CLINICAL DATA:  Left hip pain after a fall. EXAM: DG HIP (WITH OR WITHOUT PELVIS) 2-3V LEFT COMPARISON:  04/18/2023 FINDINGS: Degenerative changes in the lumbar spine and hips. Left hip demonstrates near bone on bone appearance with sclerosis and prominent osteophyte formation on both sides of the joint. No evidence of acute fracture or dislocation of  the left hip or pelvis. No focal bone lesion or bone destruction. SI joints and symphysis pubis are not displaced. Calcified phleboliths in the pelvis. IMPRESSION: Prominent degenerative changes in the left hip. No acute bony abnormalities. Electronically Signed   By: Boyce Byes M.D.   On: 11/24/2023 17:30     Subjective: No complaints  Discharge Exam: Vitals:   11/29/23 0446 11/29/23 0735  BP: 111/60 (!) 113/58  Pulse: 89 89  Resp: 18   Temp: 98.2 F (36.8 C) 98.4 F (36.9 C)  SpO2: 95% 93%   Vitals:   11/28/23 1516 11/28/23 2212 11/29/23 0446 11/29/23 0735  BP: 102/61 105/62 111/60 (!) 113/58  Pulse: 84 90 89 89  Resp: 16 18 18    Temp: 98.4 F (36.9 C) 98.9 F (37.2 C) 98.2 F (36.8 C) 98.4 F (36.9 C)  TempSrc:  Oral Oral Oral  SpO2: 94% 95% 95% 93%  Weight:       Height:        General: Pt is alert, awake, not in acute distress Cardiovascular: RRR, S1/S2 +, no rubs, no gallops Respiratory: CTA bilaterally, no wheezing, no rhonchi Abdominal: Soft, NT, ND, bowel sounds + Extremities: no edema, no cyanosis    The results of significant diagnostics from this hospitalization (including imaging, microbiology, ancillary and laboratory) are listed below for reference.     Microbiology: Recent Results (from the past 240 hours)  Surgical PCR screen     Status: None   Collection Time: 11/24/23 11:58 PM   Specimen: Nasal Mucosa; Nasal Swab  Result Value Ref Range Status   MRSA, PCR NEGATIVE NEGATIVE Final   Staphylococcus aureus NEGATIVE NEGATIVE Final    Comment: (NOTE) The Xpert SA Assay (FDA approved for NASAL specimens in patients 27 years of age and older), is one component of a comprehensive surveillance program. It is not intended to diagnose infection nor to guide or monitor treatment. Performed at [facility], [address] [place], [place] [address]   Urine Culture     Status: Abnormal   Collection Time: 11/25/23 10:20 AM   Specimen: Urine, Random  Result Value Ref Range Status   Specimen Description   Final    URINE, RANDOM Performed at [facility], [address] [place], [place] [address]    Special Requests   Final    NONE Reflexed from [id number] Performed at [facility], [address] [place], [place] [address]    Culture 20,000 COLONIES/mL KLEBSIELLA PNEUMONIAE (A)  Final   Report Status 11/27/2023 FINAL  Final   Organism ID, Bacteria KLEBSIELLA PNEUMONIAE (A)  Final      Susceptibility   Klebsiella pneumoniae - MIC*    AMPICILLIN RESISTANT Resistant     CEFAZOLIN <=4 SENSITIVE Sensitive     CEFEPIME <=0.12 SENSITIVE Sensitive     CEFTRIAXONE <=0.25 SENSITIVE Sensitive     CIPROFLOXACIN  <=0.25 SENSITIVE Sensitive     GENTAMICIN <=1 SENSITIVE Sensitive      IMIPENEM <=0.25 SENSITIVE Sensitive     NITROFURANTOIN 64 INTERMEDIATE Intermediate     TRIMETH /SULFA  <=20 SENSITIVE Sensitive     AMPICILLIN/SULBACTAM <=2 SENSITIVE Sensitive     PIP/TAZO <=4 SENSITIVE Sensitive ug/mL    * 20,000 COLONIES/mL KLEBSIELLA PNEUMONIAE     Labs: BNP (last 3 results) Recent Labs    05/16/23 1207  BNP 25.8   Basic Metabolic Panel: Recent Labs  Lab 11/24/23 1718 11/25/23 1441 11/26/23 0323 11/27/23 0545 11/27/23 1954 11/28/23  1610 11/29/23 0652  NA 139  --  137 137  --  135 139  K 3.8  --  3.8 3.8  --  4.4 4.2  CL 106  --  106 105  --  100 105  CO2 18*  --  24 23  --  24 22  GLUCOSE 139*  --  134* 144*  --  223* 136*  BUN 36*  --  22 22  --  18 24*  CREATININE 1.23*   < > 0.68 0.89 1.03* 1.00 0.90  CALCIUM  9.5  --  9.2 9.1  --  9.0 9.1   < > = values in this interval not displayed.   Liver Function Tests: Recent Labs  Lab 11/24/23 1718 11/26/23 0323 11/27/23 0545  AST 20 13* 16  ALT 15 11 10   ALKPHOS 60 51 45  BILITOT 0.8 0.6 0.5  PROT 7.0 6.0* 5.7*  ALBUMIN 4.1 3.5 3.0*   No results for input(s): "LIPASE", "AMYLASE" in the last 168 hours. No results for input(s): "AMMONIA" in the last 168 hours. CBC: Recent Labs  Lab 11/24/23 1718 11/25/23 1441 11/26/23 0323 11/27/23 0545 11/27/23 1954 11/28/23 0558 11/29/23 0652  WBC 29.1*   < > 16.7* 14.2* 22.2* 16.4* 17.7*  NEUTROABS 23.4*  --   --   --   --   --   --   HGB 11.6*   < > 10.7* 10.6* 10.9* 10.4* 10.0*  HCT 37.5   < > 34.0* 32.5* 33.4* 31.7* 30.6*  MCV 95.9   < > 92.4 91.5 90.5 91.1 91.3  PLT 473*   < > 442* 423* 413* 420* 442*   < > = values in this interval not displayed.   Cardiac Enzymes: No results for input(s): "CKTOTAL", "CKMB", "CKMBINDEX", "TROPONINI" in the last 168 hours. BNP: Invalid input(s): "POCBNP" CBG: Recent Labs  Lab 11/28/23 0623 11/28/23 1114 11/28/23 1604 11/28/23 2212 11/29/23 0559  GLUCAP 218* 244* 145* 170* 130*   D-Dimer No results  for input(s): "DDIMER" in the last 72 hours. Hgb A1c No results for input(s): "HGBA1C" in the last 72 hours. Lipid Profile No results for input(s): "CHOL", "HDL", "LDLCALC", "TRIG", "CHOLHDL", "LDLDIRECT" in the last 72 hours. Thyroid  function studies No results for input(s): "TSH", "T4TOTAL", "T3FREE", "THYROIDAB" in the last 72 hours.  Invalid input(s): "FREET3" Anemia work up No results for input(s): "VITAMINB12", "FOLATE", "FERRITIN", "TIBC", "IRON", "RETICCTPCT" in the last 72 hours. Urinalysis    Component Value Date/Time   COLORURINE YELLOW 11/25/2023 1020   APPEARANCEUR CLEAR 11/25/2023 1020   LABSPEC 1.018 11/25/2023 1020   PHURINE 5.0 11/25/2023 1020   GLUCOSEU >=500 (A) 11/25/2023 1020   GLUCOSEU NEGATIVE 12/31/2016 0935   HGBUR SMALL (A) 11/25/2023 1020   BILIRUBINUR NEGATIVE 11/25/2023 1020   KETONESUR 5 (A) 11/25/2023 1020   PROTEINUR NEGATIVE 11/25/2023 1020   UROBILINOGEN 0.2 12/31/2016 0935   NITRITE NEGATIVE 11/25/2023 1020   LEUKOCYTESUR SMALL (A) 11/25/2023 1020   Sepsis Labs Recent Labs  Lab 11/27/23 0545 11/27/23 1954 11/28/23 0558 11/29/23 0652  WBC 14.2* 22.2* 16.4* 17.7*   Microbiology Recent Results (from the past 240 hours)  Surgical PCR screen     Status: None   Collection Time: 11/24/23 11:58 PM   Specimen: Nasal Mucosa; Nasal Swab  Result Value Ref Range Status   MRSA, PCR NEGATIVE NEGATIVE Final   Staphylococcus aureus NEGATIVE NEGATIVE Final    Comment: (NOTE) The Xpert SA Assay (FDA approved for NASAL specimens in  patients 8 years of age and older), is one component of a comprehensive surveillance program. It is not intended to diagnose infection nor to guide or monitor treatment. Performed at [facility], [address], [place], [place] [address]   Urine Culture     Status: Abnormal   Collection Time: 11/25/23 10:20 AM   Specimen: Urine, Random  Result Value Ref Range Status   Specimen Description   Final     URINE, RANDOM Performed at [facility] [address], [place], [place] [address]    Special Requests   Final    NONE Reflexed from [id number] Performed at [facility], [address] [place], [place] [address]    Culture 20,000 COLONIES/mL KLEBSIELLA PNEUMONIAE (A)  Final   Report Status 11/27/2023 FINAL  Final   Organism ID, Bacteria KLEBSIELLA PNEUMONIAE (A)  Final      Susceptibility   Klebsiella pneumoniae - MIC*    AMPICILLIN RESISTANT Resistant     CEFAZOLIN <=4 SENSITIVE Sensitive     CEFEPIME <=0.12 SENSITIVE Sensitive     CEFTRIAXONE <=0.25 SENSITIVE Sensitive     CIPROFLOXACIN  <=0.25 SENSITIVE Sensitive     GENTAMICIN <=1 SENSITIVE Sensitive     IMIPENEM <=0.25 SENSITIVE Sensitive     NITROFURANTOIN 64 INTERMEDIATE Intermediate     TRIMETH /SULFA  <=20 SENSITIVE Sensitive     AMPICILLIN/SULBACTAM <=2 SENSITIVE Sensitive     PIP/TAZO <=4 SENSITIVE Sensitive ug/mL    * 20,000 COLONIES/mL KLEBSIELLA PNEUMONIAE     Time coordinating discharge: Over 35 minutes  SIGNED:   Macdonald Savoy, MD  [facility] 11/29/2023, 9:22 AM Pager   If 7PM-7AM, please contact night-coverage [email or web address] Password [email or web address]

## 2023-11-29 NOTE — TOC Progression Note (Signed)
 Transition of Care [facility]) - Progression Note    Patient Details  Name: LEANOR VORIS MRN: [id number] Date of Birth: August 10, 1950  Transition of Care [facility]) CM/SW Contact  Lezlie Ritchey, Arturo Late, RN Phone Number: 11/29/2023, 9:30 AM  Clinical Narrative:     Luster Salters with [facility] discharge order in   Expected Discharge Plan: Home w Home Health Services Barriers to Discharge: Continued Medical Work up  Expected Discharge Plan and Services In-house Referral: NA Discharge Planning Services: CM Consult Post Acute Care Choice: Home Health Living arrangements for the past 2 months: Single Family Home Expected Discharge Date: 11/29/23               DME Arranged: N/A DME Agency: NA       HH Arranged: PT HH Agency: [facility]) Date HH Agency Contacted: 11/28/23 Time HH Agency Contacted: 1533 Representative spoke with at [facility] Agency: Engineer, materials   Social Determinants of Health (SDOH) Interventions SDOH Screenings   Food Insecurity: No Food Insecurity (11/24/2023)  Housing: Low Risk  (11/24/2023)  Transportation Needs: No Transportation Needs (11/24/2023)  Utilities: Not At Risk (11/24/2023)  Alcohol Screen: Low Risk  (12/18/2022)  Depression (PHQ2-9): High Risk (12/18/2022)  Financial Resource Strain: Low Risk  (04/26/2023)  Physical Activity: Inactive (04/26/2023)  Social Connections: Moderately Isolated (11/24/2023)  Stress: Stress Concern Present (04/26/2023)  Tobacco Use: Low Risk  (11/24/2023)    Readmission Risk Interventions    11/25/2023   12:34 PM  Readmission Risk Prevention Plan  Transportation Screening Complete  PCP or Specialist Appt within 5-7 Days Complete  Home Care Screening Complete  Medication Review (RN CM) Complete

## 2023-11-29 NOTE — Progress Notes (Signed)
 Subjective: 2 Days Post-Op Procedure(s) (LRB): LEFT OPEN REDUCTION INTERNAL FIXATION (ORIF) INTERTROCHANTOR (Left) Patient reports pain as mild.    Objective: Vital signs in last 24 hours: Temp:  [98.2 F (36.8 C)-98.9 F (37.2 C)] 98.4 F (36.9 C) (06/06 0735) Pulse Rate:  [84-90] 89 (06/06 0735) Resp:  [16-18] 18 (06/06 0446) BP: (102-113)/(58-62) 113/58 (06/06 0735) SpO2:  [93 %-95 %] 93 % (06/06 0735)  Intake/Output from previous day: 06/05 0701 - 06/06 0700 In: 480 [P.O.:480] Out: 1250 [Urine:1250] Intake/Output this shift: No intake/output data recorded.  Recent Labs    11/27/23 0545 11/27/23 1954 11/28/23 0558  HGB 10.6* 10.9* 10.4*   Recent Labs    11/27/23 1954 11/28/23 0558  WBC 22.2* 16.4*  RBC 3.69* 3.48*  HCT 33.4* 31.7*  PLT 413* 420*   Recent Labs    11/27/23 0545 11/27/23 1954 11/28/23 0558  NA 137  --  135  K 3.8  --  4.4  CL 105  --  100  CO2 23  --  24  BUN 22  --  18  CREATININE 0.89 1.03* 1.00  GLUCOSE 144*  --  223*  CALCIUM  9.1  --  9.0   No results for input(s): "LABPT", "INR" in the last 72 hours.  Neurologically intact Neurovascular intact Sensation intact distally Intact pulses distally Dorsiflexion/Plantar flexion intact Incision: dressing C/D/I No cellulitis present Compartment soft   Assessment/Plan: 2 Days Post-Op Procedure(s) (LRB): LEFT OPEN REDUCTION INTERNAL FIXATION (ORIF) INTERTROCHANTOR (Left) Up with therapy Weightbearing: WBAT LLE Insicional and dressing care: Dressings left intact until follow-up Orthopedic device(s): None Showering: pod #3.  Cover bandage VTE prophylaxis: Lovenox  40mg  qd x 2 weeks Pain control: norco Follow - up plan: 2 weeks Contact information:  xu MD, Flint Hummer PA      Sandie Cross 11/29/2023, 7:56 AM

## 2023-11-29 NOTE — Progress Notes (Signed)
 Physical Therapy Treatment Patient Details Name: Margaret Norman MRN: [id number] DOB: 1951/04/15 Today's Date: 11/29/2023   History of Present Illness 73 yo female presents to ED 6/1 for fall with resultant L intertrochanteric femur fx. S/p ORIF 6/4.  PMH: nonischemic cardiomyopathy EF 45%, OA, CHF, depression, gallstones, kidney stones, HTN, kidney mass, LBBB, PNA, type II DM, R breast cancer with breast lumpectomy and partial mastectomy with radioactive seed, abdominal wall hernia repair, knee arthroscopy, splenectomy.    PT Comments  Pt reporting pain as moderate in L hip and thigh. Pt with good activity tolerance progression over the past 24 hours, has been mobilizing to/from bathroom and states she is doing better mobility-wise. Pt ambulatory for 100+ ft with RW with PT, pt requiring supervision for safety only. PT administered HEP and pt demonstrated each supine/seated exercise with proficiency. Pt endorses eagerness to d/c home, all PT education complete and pt with no further questions.     If plan is discharge home, recommend the following: A little help with walking and/or transfers;A little help with bathing/dressing/bathroom   Can travel by private vehicle        Equipment Recommendations  None recommended by PT (daughter ordered pt a toilet riser and lift chair)    Recommendations for Other Services       Precautions / Restrictions Precautions Precautions: Fall Restrictions Weight Bearing Restrictions Per Provider Order: No LLE Weight Bearing Per Provider Order: Weight bearing as tolerated     Mobility  Bed Mobility               General bed mobility comments: up in chair    Transfers Overall transfer level: Needs assistance Equipment used: Rolling walker (2 wheels) Transfers: Sit to/from Stand Sit to Stand: Supervision           General transfer comment: for safety, cues for correct hand placement when rising and sitting. stand x2, from recliner and  toilet    Ambulation/Gait Ambulation/Gait assistance: Supervision Gait Distance (Feet): 115 Feet Assistive device: Rolling walker (2 wheels) Gait Pattern/deviations: Antalgic, Trunk flexed, Decreased step length - left, Decreased stance time - left, Step-through pattern Gait velocity: decr     General Gait Details: for safety, cues for upright posture, placement in RW, how to progress step-to to step-through gait   Stairs             Wheelchair Mobility     Tilt Bed    Modified Rankin (Stroke Patients Only)       Balance Overall balance assessment: Needs assistance, History of Falls Sitting-balance support: No upper extremity supported, Feet supported Sitting balance-Leahy Scale: Fair     Standing balance support: Bilateral upper extremity supported, During functional activity, Reliant on assistive device for balance Standing balance-Leahy Scale: Poor                              Communication Communication Communication: No apparent difficulties  Cognition Arousal: Alert Behavior During Therapy: WFL for tasks assessed/performed   PT - Cognitive impairments: No apparent impairments                         Following commands: Intact      Cueing Cueing Techniques: Verbal cues  Exercises Total Joint Exercises Quad Sets: AROM, Left, 5 reps, Supine Short Arc Quad: AROM, Left, 5 reps, Supine Heel Slides: AROM, Left, 5 reps, Supine Hip ABduction/ADduction: AAROM, Left, 5  reps, Supine Long Arc Quad: AROM, Left, 5 reps, Seated    General Comments  Home walking program: up and walking 1x/hour during waking hours for short household distances with supervision of family, to promote circulation, activity tolerance, and strength maintenance.        Pertinent Vitals/Pain Pain Assessment Pain Assessment: 0-10 Pain Score: 5  Pain Location: L hip Pain Descriptors / Indicators: Sore, Discomfort Pain Intervention(s): Limited activity within  patient's tolerance, Monitored during session, Repositioned    Home Living                          Prior Function            PT Goals (current goals can now be found in the care plan section) Acute Rehab PT Goals Patient Stated Goal: go home PT Goal Formulation: With patient Time For Goal Achievement: 12/12/23 Potential to Achieve Goals: Good Progress towards PT goals: Progressing toward goals    Frequency    Min 2X/week      PT Plan      Co-evaluation              AM-PAC PT "6 Clicks" Mobility   Outcome Measure  Help needed turning from your back to your side while in a flat bed without using bedrails?: A Little Help needed moving from lying on your back to sitting on the side of a flat bed without using bedrails?: A Little Help needed moving to and from a bed to a chair (including a wheelchair)?: A Little Help needed standing up from a chair using your arms (e.g., wheelchair or bedside chair)?: A Little Help needed to walk in hospital room?: A Little Help needed climbing 3-5 steps with a railing? : A Lot 6 Click Score: 17    End of Session Equipment Utilized During Treatment: Gait belt Activity Tolerance: Patient tolerated treatment well;Patient limited by pain Patient left: in chair;with call bell/phone within reach Nurse Communication: Mobility status PT Visit Diagnosis: Other abnormalities of gait and mobility (R26.89);Muscle weakness (generalized) (M62.81)     Time: 2130-8657 PT Time Calculation (min) (ACUTE ONLY): 26 min  Charges:    $Gait Training: 8-22 mins $Therapeutic Exercise: 8-22 mins PT General Charges $$ ACUTE PT VISIT: 1 Visit                     Shirlene Doughty, PT DPT Acute Rehabilitation Services Secure Chat Preferred  Office [phone]    Sindy Mccune E Stroup 11/29/2023, 11:22 AM

## 2023-11-29 NOTE — Progress Notes (Signed)
 Reviewed AVS, patient expressed understanding of medications, MD follow up reviewed.   Removed IV, Site clean, dry and intact.  Patient states all belongings brought to the hospital at time of admission are accounted for and packed to take home.  Patient given paper prescriptions to have filled at her pharmacy.  Patient states her family is bringing her clothes to discharge in and will be at the hospital within 20-30 mins.

## 2023-12-01 DIAGNOSIS — I441 Atrioventricular block, second degree: Secondary | ICD-10-CM | POA: Diagnosis not present

## 2023-12-01 DIAGNOSIS — Z7902 Long term (current) use of antithrombotics/antiplatelets: Secondary | ICD-10-CM | POA: Diagnosis not present

## 2023-12-01 DIAGNOSIS — I509 Heart failure, unspecified: Secondary | ICD-10-CM | POA: Diagnosis not present

## 2023-12-01 DIAGNOSIS — Z79891 Long term (current) use of opiate analgesic: Secondary | ICD-10-CM | POA: Diagnosis not present

## 2023-12-01 DIAGNOSIS — K219 Gastro-esophageal reflux disease without esophagitis: Secondary | ICD-10-CM | POA: Diagnosis not present

## 2023-12-01 DIAGNOSIS — M47817 Spondylosis without myelopathy or radiculopathy, lumbosacral region: Secondary | ICD-10-CM | POA: Diagnosis not present

## 2023-12-01 DIAGNOSIS — E1165 Type 2 diabetes mellitus with hyperglycemia: Secondary | ICD-10-CM | POA: Diagnosis not present

## 2023-12-01 DIAGNOSIS — S72142D Displaced intertrochanteric fracture of left femur, subsequent encounter for closed fracture with routine healing: Secondary | ICD-10-CM | POA: Diagnosis not present

## 2023-12-01 DIAGNOSIS — D539 Nutritional anemia, unspecified: Secondary | ICD-10-CM | POA: Diagnosis not present

## 2023-12-01 DIAGNOSIS — S72092D Other fracture of head and neck of left femur, subsequent encounter for closed fracture with routine healing: Secondary | ICD-10-CM | POA: Diagnosis not present

## 2023-12-01 DIAGNOSIS — Z9181 History of falling: Secondary | ICD-10-CM | POA: Diagnosis not present

## 2023-12-01 DIAGNOSIS — Z794 Long term (current) use of insulin: Secondary | ICD-10-CM | POA: Diagnosis not present

## 2023-12-01 DIAGNOSIS — I428 Other cardiomyopathies: Secondary | ICD-10-CM | POA: Diagnosis not present

## 2023-12-01 DIAGNOSIS — I447 Left bundle-branch block, unspecified: Secondary | ICD-10-CM | POA: Diagnosis not present

## 2023-12-01 DIAGNOSIS — J45909 Unspecified asthma, uncomplicated: Secondary | ICD-10-CM | POA: Diagnosis not present

## 2023-12-01 DIAGNOSIS — Z7982 Long term (current) use of aspirin: Secondary | ICD-10-CM | POA: Diagnosis not present

## 2023-12-01 DIAGNOSIS — Z7985 Long-term (current) use of injectable non-insulin antidiabetic drugs: Secondary | ICD-10-CM | POA: Diagnosis not present

## 2023-12-01 DIAGNOSIS — I110 Hypertensive heart disease with heart failure: Secondary | ICD-10-CM | POA: Diagnosis not present

## 2023-12-01 DIAGNOSIS — E785 Hyperlipidemia, unspecified: Secondary | ICD-10-CM | POA: Diagnosis not present

## 2023-12-01 DIAGNOSIS — D72829 Elevated white blood cell count, unspecified: Secondary | ICD-10-CM | POA: Diagnosis not present

## 2023-12-01 DIAGNOSIS — Z7984 Long term (current) use of oral hypoglycemic drugs: Secondary | ICD-10-CM | POA: Diagnosis not present

## 2023-12-01 DIAGNOSIS — E669 Obesity, unspecified: Secondary | ICD-10-CM | POA: Diagnosis not present

## 2023-12-01 DIAGNOSIS — M1612 Unilateral primary osteoarthritis, left hip: Secondary | ICD-10-CM | POA: Diagnosis not present

## 2023-12-02 ENCOUNTER — Telehealth: Payer: Self-pay

## 2023-12-02 ENCOUNTER — Ambulatory Visit: Payer: PPO | Admitting: Neurology

## 2023-12-02 NOTE — Telephone Encounter (Signed)
 Copied from CRM [id number]. Topic: Clinical - Home Health Verbal Orders >> Dec 02, 2023  8:22 AM Dominic Friendly wrote: Caller/Agency: [facility]  Callback Number: [phone] Confidential voicemail Service Requested: Physical Therapy Frequency: 1 week 8 Any new concerns about the patient? No

## 2023-12-03 ENCOUNTER — Encounter (HOSPITAL_COMMUNITY): Payer: Self-pay | Admitting: Orthopaedic Surgery

## 2023-12-03 NOTE — Anesthesia Postprocedure Evaluation (Signed)
 Anesthesia Post Note  Patient: Margaret Norman  Procedure(s) Performed: LEFT OPEN REDUCTION INTERNAL FIXATION (ORIF) INTERTROCHANTOR (Left: Leg Upper)     Patient location during evaluation: PACU Anesthesia Type: General Level of consciousness: patient cooperative Pain management: pain level controlled Vital Signs Assessment: post-procedure vital signs reviewed and stable Respiratory status: spontaneous breathing, nonlabored ventilation and respiratory function stable Cardiovascular status: blood pressure returned to baseline and stable Postop Assessment: no apparent nausea or vomiting Anesthetic complications: no   No notable events documented.                Briante Loveall

## 2023-12-04 NOTE — Telephone Encounter (Signed)
 Called Margaret Norman back and left a VM informing him we have not seen pt since November of 2024 and it may be best to reach out to pts orthopedic provider. Teretha Ferguson to give the office a call back with any further questions or concerns.

## 2023-12-11 ENCOUNTER — Other Ambulatory Visit (INDEPENDENT_AMBULATORY_CARE_PROVIDER_SITE_OTHER): Payer: Self-pay

## 2023-12-11 ENCOUNTER — Encounter: Payer: Self-pay | Admitting: Orthopaedic Surgery

## 2023-12-11 ENCOUNTER — Ambulatory Visit (INDEPENDENT_AMBULATORY_CARE_PROVIDER_SITE_OTHER): Admitting: Orthopaedic Surgery

## 2023-12-11 DIAGNOSIS — M25552 Pain in left hip: Secondary | ICD-10-CM

## 2023-12-11 NOTE — Progress Notes (Signed)
 Post-Op Visit Note   Patient: Margaret Norman           Date of Birth: [birth year]           MRN: [id number] Visit Date: 12/11/2023 PCP: Adra Alanis, FNP   Assessment & Plan:  Chief Complaint:  Chief Complaint  Patient presents with   Left Hip - Follow-up    Left ORIF intertrochantor 11/27/2023   Visit Diagnoses:  1. Pain in left hip     Plan: Patient is a 73 year old female 2-week postop from a nondisplaced left intertrochanteric fracture treated with FNS implant.  She reports thigh pain.  She is currently weightbearing as tolerated and doing home health physical therapy.  Exam shows healed surgical incision without any signs of infection.  Calf is nontender.  Adequate motion of the hip.    Staples removed Steri-Strips applied.  Finish remaining 2 doses of Lovenox  and start aspirin 81 mg twice daily for 4 weeks.  Recheck in 4 weeks with repeat imaging of the left hip.  Follow-Up Instructions: Return in about 4 weeks (around 01/08/2024) for with lindsey.   Orders:  Orders Placed This Encounter  Procedures   XR HIP UNILAT W OR W/O PELVIS 2-3 VIEWS LEFT   No orders of the defined types were placed in this encounter.   Imaging: XR HIP UNILAT W OR W/O PELVIS 2-3 VIEWS LEFT Result Date: 12/11/2023 X-rays of the pelvis and the left hip show stable nondisplaced intertrochanteric fracture with hardware in place without complications.   PMFS History: Patient Active Problem List   Diagnosis Date Noted   Nondisplaced intertrochanteric fracture of left femur, initial encounter for closed fracture (HCC) 11/24/2023   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   Port-A-Cath in place 06/10/2019   Breast cancer (HCC) 04/15/2019   Second degree AV block    Malignant neoplasm of upper-outer quadrant of right breast in female, estrogen receptor positive (HCC) 03/25/2019   Thyromegaly 01/10/2018   Overweight (BMI 25.0-29.9) 08/13/2017   Upper respiratory tract infection 07/30/2017    Type 2 diabetes mellitus with hyperglycemia, with long-term current use of insulin  (HCC) 03/19/2017   Thiamine  deficiency 01/04/2017   Tinnitus aurium, 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   Deficiency anemia 04/30/2016   Routine general medical examination at a health care facility 04/27/2015   Abdominal wall hernia 04/26/2015   Family history of hemochromatosis 09/13/2014   Acute maxillary sinusitis 05/14/2014   History of colonic polyps 03/10/2014   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   Lumbosacral spondylosis without myelopathy 05/12/2013   Insomnia, persistent 02/04/2013   Obesity (BMI 30-39.9) 02/04/2013   Asthma with exacerbation 09/22/2012   Visit for screening mammogram 07/04/2012   Nephrolithiasis    HTN (hypertension)    Gallstones    LBBB (left bundle branch block)    Leukocytosis 12/20/2008   Hyperlipidemia with target LDL less than 100 09/04/2007   Chronic depression 09/04/2007   GERD 09/04/2007   Past Medical History:  Diagnosis Date   Allergy AMOXICILLAN   Arthritis    Chest pain    Nuclear, adenosine ,  December, 2013, low risk nuclear scan with small, moderate in intensity, fixed anteroseptal defect. This is possibly related to an LBBB versus small prior infarct. 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   Type II or unspecified type diabetes mellitus without mention of complication, not stated as uncontrolled     Family History  Problem Relation Age of Onset   Liver disease Mother    Dementia Mother    Diabetes Mother        borderline   Depression Mother    Coronary artery disease Father    Heart attack Father    Hypertension Father    Heart disease Father     Early death Father    Cancer Other        leukemia   Stroke Maternal Grandfather    Alcohol abuse Maternal Grandfather    Hyperlipidemia Brother        Amyloidosis   Cancer Brother    Hypertension Brother    Diabetes Maternal Grandmother    Cancer Paternal Uncle        unknown   Heart attack Paternal Grandmother    Heart attack Paternal Uncle    Hypertension Brother    Diabetes Daughter        borderline   Diabetes Sister    Heart disease Sister    Kidney disease Sister    Heart disease Brother    Colon cancer Neg Hx    Esophageal cancer Neg Hx    Rectal cancer Neg Hx    Stomach cancer Neg Hx     Past Surgical History:  Procedure Laterality Date   APPENDECTOMY     BREAST BIOPSY Left 10/2017   BREAST BIOPSY Right 03/2019   BREAST BIOPSY Right 08/26/2023   US  RT BREAST BX W LOC DEV 1ST LESION IMG BX SPEC US  GUIDE 08/26/2023 [facility] MAMMO[facility]HY   BREAST LUMPECTOMY Right 2020   BREAST LUMPECTOMY WITH RADIOACTIVE SEED AND SENTINEL LYMPH NODE BIOPSY Right 04/15/2019   Procedure: RIGHT BREAST PARTIAL MASTECTOMY WITH RADIOACTIVE SEED AND SENTINEL LYMPH NODE BIOPSY;  Surgeon: Oza Blumenthal, MD;  Location: [facility] OR;  Service: General;  Laterality: Right;   CESAREAN SECTION     x2 ? w/appy   CHOLECYSTECTOMY     ESOPHAGOGASTRODUODENOSCOPY     HERNIA REPAIR     INTRAMEDULLARY (IM) NAIL INTERTROCHANTERIC Left 11/27/2023   Procedure: LEFT OPEN REDUCTION INTERNAL FIXATION (ORIF) INTERTROCHANTOR;  Surgeon: Wes Hamman, MD;  Location: [facility] OR;  Service: Orthopedics;  Laterality: Left;   KNEE ARTHROSCOPY Right 11/2005   LIVER BIOPSY     PANCREATIC CYST EXCISION     PORT-A-CATH REMOVAL Left 04/25/2020   Procedure: REMOVAL PORT-A-CATH;  Surgeon: Oza Blumenthal, MD;  Location:  SURGERY CENTER;  Service: General;  Laterality: Left;   PORTACATH PLACEMENT Left 04/15/2019   Procedure: INSERTION PORT-A-CATH WITH ULTRASOUND;  Surgeon: Oza Blumenthal, MD;  Location: [facility] OR;  Service:  General;  Laterality: Left;   RIGHT/LEFT HEART CATH AND CORONARY ANGIOGRAPHY N/A 09/28/2019   Procedure: RIGHT/LEFT HEART CATH AND CORONARY ANGIOGRAPHY;  Surgeon: Darlis Eisenmenger, MD;  Location: [facility] INV[facility] CV LAB;  Service: Cardiovascular;  Laterality: N/A;   SPLENECTOMY     TONSILLECTOMY AND ADENOIDECTOMY     TUBAL LIGATION  06/24/78   Social History   Occupational History   Occupation: CSR    Employer: [employer]   Occupation: retired  Tobacco Use   Smoking status: Never   Smokeless tobacco: Never  Vaping Use   Vaping status: Never Used  Substance and Sexual Activity  Alcohol use: No   Drug use: No   Sexual activity: Not Currently    Birth control/protection: Pill    Comment: Tubes tied

## 2023-12-12 ENCOUNTER — Encounter: Payer: Self-pay | Admitting: Family

## 2023-12-12 ENCOUNTER — Inpatient Hospital Stay: Admitting: Family

## 2023-12-19 ENCOUNTER — Ambulatory Visit: Payer: PPO | Admitting: *Deleted

## 2023-12-19 VITALS — Ht 62.0 in | Wt 128.0 lb

## 2023-12-19 DIAGNOSIS — Z0000 Encounter for general adult medical examination without abnormal findings: Secondary | ICD-10-CM

## 2023-12-19 NOTE — Patient Instructions (Signed)
 Ms. Gora , Thank you for taking time out of your busy schedule to complete your Annual Wellness Visit with me. I enjoyed our conversation and look forward to speaking with you again next year. I, as well as your care team,  appreciate your ongoing commitment to your health goals. Please review the following plan we discussed and let me know if I can assist you in the future. Your Game plan/ To Do List    You are due for a colonoscopy on or after 03/19/24.  Please call [facility] at [phone] if you have not been contacted by the above date.  Follow up Visits: Next Medicare AWV with our clinical staff: 12/22/24 9am, telephone    Next Office Visit with your provider: 02/06/24 10am  Clinician Recommendations:  Aim for 30 minutes of exercise or brisk walking, 6-8 glasses of water , and 5 servings of fruits and vegetables each day.       This is a list of the screening recommended for you and due dates:  Health Maintenance  Topic Date Due   Zoster (Shingles) Vaccine (1 of 2) 05/25/1969   COVID-19 Vaccine (3 - Pfizer risk series) 10/31/2019   Medicare Annual Wellness Visit  12/18/2023   Colon Cancer Screening  03/19/2024   Flu Shot  01/24/2024   Hemoglobin A1C  04/23/2024   Eye exam for diabetics  09/10/2024   Yearly kidney health urinalysis for diabetes  10/22/2024   Complete foot exam   10/22/2024   Yearly kidney function blood test for diabetes  11/28/2024   Mammogram  08/18/2025   DTaP/Tdap/Td vaccine (4 - Td or Tdap) 02/01/2029   Pneumococcal Vaccine for age over 70  Completed   DEXA scan (bone density measurement)  Completed   Hepatitis C Screening  Completed   Hepatitis B Vaccine  Aged Out   HPV Vaccine  Aged Out   Meningitis B Vaccine  Aged Out    Advanced directives: (Copy Requested) Please bring a copy of your health care power of attorney and living will to the office to be added to your chart at your convenience. You can mail to [facility] [address] [place], [place] [address] or email to [email or web address] Advance Care Planning is important because it:  [x]  Makes sure you receive the medical care that is consistent with your values, goals, and preferences  [x]  It provides guidance to your family and loved ones and reduces their decisional burden about whether or not they are making the right decisions based on your wishes.  Follow the link provided in your after visit summary or read over the paperwork we have mailed to you to help you started getting your Advance Directives in place. If you need assistance in completing these, please reach out to us  so that we can help you!  See attachments for Preventive Care and Fall Prevention Tips.

## 2023-12-19 NOTE — Progress Notes (Signed)
 Please attest this visit in the absence of patient primary care provider.    Subjective:   Margaret Norman is a 73 y.o. who presents for a Medicare Wellness preventive visit.  As a reminder, Annual Wellness Visits don't include a physical exam, and some assessments may be limited, especially if this visit is performed virtually. We may recommend an in-person follow-up visit with your provider if needed.  Visit Complete: Virtual I connected with  Margaret Norman on 12/19/23 by a audio enabled telemedicine application and verified that I am speaking with the correct person using two identifiers.  Patient Location: Home  Provider Location: Office/Clinic  I discussed the limitations of evaluation and management by telemedicine. The patient expressed understanding and agreed to proceed.  Vital Signs: Because this visit was a virtual/telehealth visit, some criteria may be missing or patient reported. Any vitals not documented were not able to be obtained and vitals that have been documented are patient reported.  VideoDeclined- This patient declined Librarian, academic. Therefore the visit was completed with audio only.  Persons Participating in Visit: Patient.  AWV Questionnaire: Yes: Patient Medicare AWV questionnaire was completed by the patient on 12/18/23; I have confirmed that all information answered by patient is correct and no changes since this date.  Cardiac Risk Factors include: advanced age (>49men, >47 women);hypertension;diabetes mellitus;dyslipidemia;Other (see comment), Risk factor comments: breast cancer     Objective:    Today's Vitals   12/18/23 0741 12/19/23 0859  Weight:  128 lb (58.1 kg)  Height:  5' 2 (1.575 m)  PainSc: 0-No pain    Body mass index is 23.41 kg/m.     12/19/2023    9:26 AM 11/24/2023   11:35 PM 06/03/2023   11:15 AM 04/26/2023    3:03 PM 12/18/2022    9:03 AM 04/09/2022   10:09 AM 12/13/2021    1:58 PM  Advanced  Directives  Does Patient Have a Medical Advance Directive? No No No No No No No  Would patient like information on creating a medical advance directive? Yes (MAU/Ambulatory/Procedural Areas - Information given) No - Patient declined   No - Patient declined No - Patient declined No - Patient declined    Current Medications (verified) Outpatient Encounter Medications as of 12/19/2023  Medication Sig   acetaminophen  (TYLENOL ) 500 MG tablet Take 1,000 mg by mouth every 6 (six) hours as needed for moderate pain.   anastrozole  (ARIMIDEX ) 1 MG tablet Take 1 tablet (1 mg total) by mouth daily.   aspirin 81 MG EC tablet Take 81 mg by mouth daily.   B Complex-C (SUPER B COMPLEX PO) Take 1 tablet by mouth daily.   buPROPion  (WELLBUTRIN  XL) 300 MG 24 hr tablet Take 1 tablet (300 mg total) by mouth every morning.   carvedilol  (COREG ) 12.5 MG tablet Take 1 tablet (12.5 mg total) by mouth 2 (two) times daily with a meal.   Cholecalciferol  (VITAMIN D ) 50 MCG (2000 UT) tablet Take 2,000 Units by mouth daily.   Continuous Blood Gluc Receiver (FREESTYLE LIBRE 2 READER) DEVI 1 each by Does not apply route daily.   Continuous Glucose Sensor (FREESTYLE LIBRE 2 SENSOR) MISC APPLY AS DIRECTED EVERY 14 DAYS   Dulaglutide  (TRULICITY ) 3 MG/0.5ML SOAJ Inject 3 mg into the skin once a week. (Patient taking differently: Inject 3 mg into the skin once a week. ON SATURDAYS)   EASY COMFORT PEN NEEDLES 32G X 4 MM MISC USE AS DIRECTED EVERY DAY  ENTRESTO  24-26 MG TAKE 1 TABLET BY MOUTH TWICE DAILY   furosemide  (LASIX ) 40 MG tablet Take 1 tablet (40 mg total) by mouth as needed. For weight gain of 3ln in 24 hours or 5lb in a week   HYDROcodone -acetaminophen  (NORCO/VICODIN) 5-325 MG tablet Take 1 tablet by mouth every 6 (six) hours as needed for moderate pain (pain score 4-6).   JARDIANCE  25 MG TABS tablet TAKE 1 TABLET BY MOUTH DAILY   LANTUS  SOLOSTAR 100 UNIT/ML Solostar Pen INJECT SUBCUTANEOUSLY 20 UNITS AT BEDTIME    meclizine  (ANTIVERT ) 25 MG tablet Take 25 mg by mouth 3 (three) times daily as needed for dizziness.   metFORMIN  (GLUCOPHAGE ) 1000 MG tablet TAKE 1 TABLET BY MOUTH TWICE DAILY WITH A MEAL   omeprazole  (PRILOSEC) 40 MG capsule TAKE 1 CAPSULE BY MOUTH 30-60 MINUTES PRIOR TO BREAKFAST AND DINNER   Probiotic Product (PROBIOTIC ADVANCED PO) Take 1 tablet by mouth in the morning and at bedtime.   repaglinide  (PRANDIN ) 1 MG tablet Take 1 tablet (1 mg total) by mouth daily before supper.   rosuvastatin  (CRESTOR ) 10 MG tablet TAKE 1 TABLET BY MOUTH DAILY   sertraline  (ZOLOFT ) 100 MG tablet Take 1 tablet (100 mg total) by mouth daily.   spironolactone  (ALDACTONE ) 25 MG tablet TAKE 1 TABLET BY MOUTH DAILY   Vitamin E 180 MG CAPS Take 180 mg by mouth at bedtime.    zinc  gluconate 50 MG tablet Take 50 mg by mouth daily.   [DISCONTINUED] enoxaparin  (LOVENOX ) 40 MG/0.4ML injection Inject 0.4 mLs (40 mg total) into the skin daily for 14 doses.   No facility-administered encounter medications on file as of 12/19/2023.    Allergies (verified) Invokana  [canagliflozin ] and Amoxicillin    History: Past Medical History:  Diagnosis Date   Allergy AMOXICILLAN   Arthritis    Chest pain    Nuclear, adenosine ,  December, 2013, low risk nuclear scan with small, moderate in intensity, fixed anteroseptal defect. This is possibly related to an LBBB versus small prior infarct. 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   Type II or unspecified type diabetes mellitus without mention of complication, not stated as uncontrolled    Past Surgical History:  Procedure Laterality Date   APPENDECTOMY     BREAST BIOPSY Left 10/2017    BREAST BIOPSY Right 03/2019   BREAST BIOPSY Right 08/26/2023   US  RT BREAST BX W LOC DEV 1ST LESION IMG BX SPEC US  GUIDE 08/26/2023 [facility] MAMMOGRAPHY   BREAST LUMPECTOMY Right 2020   BREAST LUMPECTOMY WITH RADIOACTIVE SEED AND SENTINEL LYMPH NODE BIOPSY Right 04/15/2019   Procedure: RIGHT BREAST PARTIAL MASTECTOMY WITH RADIOACTIVE SEED AND SENTINEL LYMPH NODE BIOPSY;  Surgeon: Vernetta Berg, MD;  Location: [facility] OR;  Service: General;  Laterality: Right;   CESAREAN SECTION     x2 ? w/appy   CHOLECYSTECTOMY     ESOPHAGOGASTRODUODENOSCOPY     HERNIA REPAIR     INTRAMEDULLARY (IM) NAIL INTERTROCHANTERIC Left 11/27/2023   Procedure: LEFT OPEN REDUCTION INTERNAL FIXATION (ORIF) INTERTROCHANTOR;  Surgeon: Jerri Kay HERO, MD;  Location: [facility] OR;  Service: Orthopedics;  Laterality: Left;   KNEE ARTHROSCOPY  Right 11/2005   LIVER BIOPSY     PANCREATIC CYST EXCISION     PORT-A-CATH REMOVAL Left 04/25/2020   Procedure: REMOVAL PORT-A-CATH;  Surgeon: Vernetta Berg, MD;  Location: [facility];  Service: General;  Laterality: Left;   PORTACATH PLACEMENT Left 04/15/2019   Procedure: INSERTION PORT-A-CATH WITH ULTRASOUND;  Surgeon: Vernetta Berg, MD;  Location: [facility] OR;  Service: General;  Laterality: Left;   RIGHT/LEFT HEART CATH AND CORONARY ANGIOGRAPHY N/A 09/28/2019   Procedure: RIGHT/LEFT HEART CATH AND CORONARY ANGIOGRAPHY;  Surgeon: Rolan Ezra RAMAN, MD;  Location: [facility] INVASIVE CV LAB;  Service: Cardiovascular;  Laterality: N/A;   SPLENECTOMY     TONSILLECTOMY AND ADENOIDECTOMY     TUBAL LIGATION  06/24/78   Family History  Problem Relation Age of Onset   Liver disease Mother    Dementia Mother    Diabetes Mother        borderline   Depression Mother    Coronary artery disease Father    Heart attack Father    Hypertension Father    Heart disease Father    Early death Father    Cancer Other        leukemia   Stroke Maternal Grandfather    Alcohol abuse Maternal  Grandfather    Hyperlipidemia Brother        Amyloidosis   Cancer Brother    Hypertension Brother    Diabetes Maternal Grandmother    Cancer Paternal Uncle        unknown   Heart attack Paternal Grandmother    Heart attack Paternal Uncle    Hypertension Brother    Diabetes Daughter        borderline   Diabetes Sister    Heart disease Sister    Kidney disease Sister    Heart disease Brother    Colon cancer Neg Hx    Esophageal cancer Neg Hx    Rectal cancer Neg Hx    Stomach cancer Neg Hx    Social History   Socioeconomic History   Marital status: Married    Spouse name: Choua Ikner   Number of children: 2   Years of education: Not on file   Highest education level: 12th grade  Occupational History   Occupation: CSR    Employer: [employer]   Occupation: retired  Tobacco Use   Smoking status: Never   Smokeless tobacco: Never  Vaping Use   Vaping status: Never Used  Substance and Sexual Activity   Alcohol use: No   Drug use: No   Sexual activity: Not Currently    Birth control/protection: Pill    Comment: Tubes tied  Other Topics Concern   Not on file  Social History Narrative   Regular Exercise -  NO   Are you right handed or left handed? Right Handed    Are you currently employed ? No    What is your current occupation? Retired   Do you live at home alone? No   Who lives with you? Husband, Daughter, and Darden   What type of home do you live in: 1 story or 2 story? Lives in a one story home       Social Drivers of Health   Financial Resource Strain: Low Risk  (12/19/2023)   Overall Financial Resource Strain (CARDIA)    Difficulty of Paying Living Expenses: Not hard at all  Food Insecurity: No Food Insecurity (12/19/2023)   Hunger Vital Sign    Worried  About Running Out of Food in the Last Year: Never true    Ran Out of Food in the Last Year: Never true  Transportation Needs: No Transportation Needs (12/19/2023)   PRAPARE - Therapist, art (Medical): No    Lack of Transportation (Non-Medical): No  Physical Activity: Insufficiently Active (12/19/2023)   Exercise Vital Sign    Days of Exercise per Week: 5 days    Minutes of Exercise per Session: 20 min  Stress: No Stress Concern Present (12/19/2023)   [employer] of Occupational Health - Occupational Stress Questionnaire    Feeling of Stress: Not at all  Social Connections: Moderately Isolated (12/19/2023)   Social Connection and Isolation Panel    Frequency of Communication with Friends and Family: More than three times a week    Frequency of Social Gatherings with Friends and Family: More than three times a week    Attends Religious Services: Never    Database administrator or Organizations: No    Attends Engineer, structural: Never    Marital Status: Married    Tobacco Counseling Counseling given: Not Answered    Clinical Intake:  Pre-visit preparation completed: Yes  Pain : No/denies pain (has hip fracture and hurst when walks) Pain Score: 0-No pain     BMI - recorded: 23.41 Nutritional Status: BMI of 19-24  Normal Nutritional Risks: None Diabetes: Yes CBG done?: No  Lab Results  Component Value Date   HGBA1C 6.5 (A) 10/23/2023   HGBA1C 6.9 (A) 06/24/2023   HGBA1C 6.4 (A) 02/12/2023     How often do you need to have someone help you when you read instructions, pamphlets, or other written materials from your doctor or pharmacy?: 1 - Never What is the last grade level you completed in school?: 12  Interpreter Needed?: No  Information entered by :: Lolita Libra, CMA   Activities of Daily Living     12/18/2023    7:41 AM 11/24/2023   11:35 PM  In your present state of health, do you have any difficulty performing the following activities:  Hearing? 0 0  Vision? 0 0  Difficulty concentrating or making decisions? 0 0  Walking or climbing stairs? 1   Dressing or bathing? 0   Doing errands, shopping? 1 0   Preparing Food and eating ? Y   Comment Family helps with food preparation   Using the Toilet? N   In the past six months, have you accidently leaked urine? N   Do you have problems with loss of bowel control? N   Managing your Medications? N   Managing your Finances? N   Housekeeping or managing your Housekeeping? Y     Patient Care Team: Jason Leita Repine, FNP as PCP - General (Internal Medicine) [name], Aleene PARAS, MD as PCP - Cardiology (Cardiology) Glean Stephane BROCKS, RN (Inactive) as Oncology Nurse Navigator Tyree Nanetta SAILOR, RN as Oncology Nurse Navigator Rolan Ezra RAMAN, MD as Consulting Physician (Cardiology) [employer] Tobie, Donika K, DO as Consulting Physician (Neurology)  I have updated your Care Teams any recent Medical Services you may have received from other providers in the past year.     Assessment:   This is a routine wellness examination for Margaret Norman.  Hearing/Vision screen Hearing Screening - Comments:: Denies hearing difficulties.  Vision Screening - Comments:: Wears RX glasses -- up to date with routine eye exams with [facility]). Cataract in right eye.    Goals  Addressed   None    Depression Screen     12/19/2023    9:19 AM 12/18/2022    9:08 AM 02/20/2022    1:09 PM 12/13/2021    1:59 PM 07/07/2021    3:29 PM 04/28/2021   10:12 AM 08/23/2020    9:16 AM  PHQ 2/9 Scores  PHQ - 2 Score 1 4 0 0 0 0 2  PHQ- 9 Score 1 14     5     Fall Risk     12/18/2023    7:41 AM 06/03/2023   11:15 AM 12/18/2022    9:13 AM 02/20/2022    1:09 PM 12/13/2021    1:58 PM  Fall Risk   Falls in the past year? 1 1 1  0 1  Number falls in past yr: 1 1 1  0 0  Injury with Fall? 1 0 0 0 0  Risk for fall due to : Impaired balance/gait  Impaired balance/gait No Fall Risks History of fall(s)  Risk for fall due to: Comment arthritis      Follow up Education provided Falls evaluation completed Falls evaluation completed;Education provided Falls evaluation completed   Falls evaluation completed      Data saved with a previous flowsheet row definition    MEDICARE RISK AT HOME:  Medicare Risk at Home Any stairs in or around the home?: (Patient-Rptd) Yes If so, are there any without handrails?: (Patient-Rptd) No Home free of loose throw rugs in walkways, pet beds, electrical cords, etc?: (Patient-Rptd) No Adequate lighting in your home to reduce risk of falls?: (Patient-Rptd) Yes Life alert?: (Patient-Rptd) No Use of a cane, walker or w/c?: (Patient-Rptd) Yes Grab bars in the bathroom?: (Patient-Rptd) Yes Shower chair or bench in shower?: (Patient-Rptd) Yes Elevated toilet seat or a handicapped toilet?: (Patient-Rptd) Yes  TIMED UP AND GO:  Was the test performed?  No,audio  Cognitive Function: 6CIT completed        12/19/2023    9:22 AM 12/18/2022    9:14 AM 12/13/2021    2:05 PM 02/02/2019   10:03 AM  6CIT Screen  What Year? 0 points 0 points 0 points 0 points  What month? 0 points 0 points 0 points 0 points  What time? 0 points 0 points 0 points 0 points  Count back from 20 0 points 0 points 0 points 0 points  Months in reverse 2 points 0 points 4 points 0 points  Repeat phrase 0 points 0 points 0 points 0 points  Total Score 2 points 0 points 4 points 0 points    Immunizations Immunization History  Administered Date(s) Administered   Fluad Quad(high Dose 65+) 02/18/2019, 07/01/2020, 04/28/2021, 04/11/2022   Influenza Split 04/09/2011, 06/12/2012   Influenza Whole 04/05/2009   Influenza, High Dose Seasonal PF 04/30/2016, 03/12/2017, 03/04/2018   Influenza,inj,Quad PF,6+ Mos 04/09/2013, 05/14/2014, 04/26/2015   Meningococcal polysaccharide vaccine (MPSV4) 08/31/2008   PFIZER(Purple Top)SARS-COV-2 Vaccination 08/30/2019, 10/03/2019   Pneumococcal Conjugate-13 09/13/2014   Pneumococcal Polysaccharide-23 08/31/2008, 12/29/2015   Td 03/26/1999, 10/18/2008   Tdap 02/02/2019   Zoster, Live 12/23/2014    Screening Tests Health  Maintenance  Topic Date Due   COVID-19 Vaccine (3 - Pfizer risk series) 10/31/2019   Colonoscopy  03/19/2024   Zoster Vaccines- Shingrix (1 of 2) 12/18/2024 (Originally 05/25/1970)   INFLUENZA VACCINE  01/24/2024   HEMOGLOBIN A1C  04/23/2024   OPHTHALMOLOGY EXAM  09/10/2024   Diabetic kidney evaluation - Urine ACR  10/22/2024   FOOT EXAM  10/22/2024   Diabetic kidney evaluation - eGFR measurement  11/28/2024   Medicare Annual Wellness (AWV)  12/18/2024   MAMMOGRAM  08/18/2025   DTaP/Tdap/Td (4 - Td or Tdap) 02/01/2029   Pneumococcal Vaccine: 50+ Years  Completed   DEXA SCAN  Completed   Hepatitis C Screening  Completed   Hepatitis B Vaccines  Aged Out   HPV VACCINES  Aged Out   Meningococcal B Vaccine  Aged Out    Health Maintenance  Health Maintenance Due  Topic Date Due   COVID-19 Vaccine (3 - Pfizer risk series) 10/31/2019   Colonoscopy  03/19/2024   Health Maintenance Items Addressed: Pt will consider Shingrix vaccine. Aware to get at local pharmacy. Declines COVID vaccine at present.  Additional Screening:  Vision Screening: Recommended annual ophthalmology exams for early detection of glaucoma and other disorders of the eye. Would you like a referral to an eye doctor? No    Dental Screening: Recommended annual dental exams for proper oral hygiene  Community Resource Referral / Chronic Care Management: CRR required this visit?  No   CCM required this visit?  No   Plan:    I have personally reviewed and noted the following in the patient's chart:   Medical and social history Use of alcohol, tobacco or illicit drugs  Current medications and supplements including opioid prescriptions. Patient is currently taking opioid prescriptions. Information provided to patient regarding non-opioid alternatives. Patient advised to discuss non-opioid treatment plan with their provider. Functional ability and status Nutritional status Physical activity Advanced  directives List of other physicians Hospitalizations, surgeries, and ER visits in previous 12 months Vitals Screenings to include cognitive, depression, and falls Referrals and appointments  In addition, I have reviewed and discussed with patient certain preventive protocols, quality metrics, and best practice recommendations. A written personalized care plan for preventive services as well as general preventive health recommendations were provided to patient.   Lolita Libra, CMA   12/19/2023   After Visit Summary: (MyChart) Due to this being a telephonic visit, the after visit summary with patients personalized plan was offered to patient via MyChart   Notes: Please refer to Routing Comments.

## 2023-12-31 ENCOUNTER — Ambulatory Visit: Admitting: Orthopaedic Surgery

## 2024-01-07 ENCOUNTER — Other Ambulatory Visit (HOSPITAL_COMMUNITY): Payer: Self-pay | Admitting: Cardiology

## 2024-01-08 ENCOUNTER — Other Ambulatory Visit (INDEPENDENT_AMBULATORY_CARE_PROVIDER_SITE_OTHER): Payer: Self-pay

## 2024-01-08 ENCOUNTER — Encounter: Payer: Self-pay | Admitting: Physician Assistant

## 2024-01-08 ENCOUNTER — Ambulatory Visit (INDEPENDENT_AMBULATORY_CARE_PROVIDER_SITE_OTHER): Admitting: Physician Assistant

## 2024-01-08 DIAGNOSIS — M25552 Pain in left hip: Secondary | ICD-10-CM

## 2024-01-08 NOTE — Progress Notes (Signed)
 Post-Op Visit Note   Patient: Margaret Norman           Date of Birth: 09-10-1950           MRN: [id number] Visit Date: 01/08/2024 PCP: Jason Leita Repine, FNP   Assessment & Plan:  Chief Complaint:  Chief Complaint  Patient presents with   Left Hip - Follow-up    Left ORIF intertrochantor 11/27/2023   Visit Diagnoses:  1. Pain in left hip     Plan: Patient is a pleasant 73 year old female who comes in today approximately 6 weeks status post ORIF left intertrochanteric femur fracture with FNS implant, date of surgery 11/27/2023.  She has been doing much better but still has some discomfort to the anterior thigh with weightbearing.  For this reason, she is still using a walker when ambulating.  She has been taking an occasional Tylenol .  She has been getting home health physical therapy.  Examination of left hip reveals painless hip flexion and logroll.  She is neurovascularly intact distally.  At this point, I would like for her to continue working with home health PT.  She will follow-up with us  in 6 weeks for repeat evaluation and x-rays of the left hip.  Call with concerns or questions in the meantime.  Follow-Up Instructions: Return in about 6 weeks (around 02/19/2024).   Orders:  Orders Placed This Encounter  Procedures   XR HIP UNILAT W OR W/O PELVIS 2-3 VIEWS LEFT   No orders of the defined types were placed in this encounter.   Imaging: XR HIP UNILAT W OR W/O PELVIS 2-3 VIEWS LEFT Result Date: 01/08/2024 X-rays demonstrate stable implant without hardware complication   PMFS History: Patient Active Problem List   Diagnosis Date Noted   Nondisplaced intertrochanteric fracture of left femur, initial encounter for closed fracture (HCC) 11/24/2023   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   Port-A-Cath in place 06/10/2019   Breast cancer (HCC) 04/15/2019   Second degree AV block    Malignant neoplasm of upper-outer quadrant of right breast in female, estrogen receptor  positive (HCC) 03/25/2019   Thyromegaly 01/10/2018   Overweight (BMI 25.0-29.9) 08/13/2017   Upper respiratory tract infection 07/30/2017   Type 2 diabetes mellitus with hyperglycemia, with long-term current use of insulin  (HCC) 03/19/2017   Thiamine  deficiency 01/04/2017   Tinnitus aurium, 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   Deficiency anemia 04/30/2016   Routine general medical examination at a health care facility 04/27/2015   Abdominal wall hernia 04/26/2015   Family history of hemochromatosis 09/13/2014   Acute maxillary sinusitis 05/14/2014   History of colonic polyps 03/10/2014   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   Lumbosacral spondylosis without myelopathy 05/12/2013   Insomnia, persistent 02/04/2013   Obesity (BMI 30-39.9) 02/04/2013   Asthma with exacerbation 09/22/2012   Visit for screening mammogram 07/04/2012   Nephrolithiasis    HTN (hypertension)    Gallstones    LBBB (left bundle branch block)    Leukocytosis 12/20/2008   Hyperlipidemia with target LDL less than 100 09/04/2007   Chronic depression 09/04/2007   GERD 09/04/2007   Past Medical History:  Diagnosis Date   Allergy AMOXICILLAN   Arthritis    Chest pain    Nuclear, adenosine ,  December, 2013, low risk nuclear scan with small, moderate in intensity, fixed anteroseptal defect. This is possibly related to an LBBB versus small prior infarct. 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   Type II or unspecified type diabetes mellitus without mention of complication, not stated as uncontrolled     Family History  Problem Relation Age of Onset   Liver disease Mother    Dementia Mother    Diabetes Mother         borderline   Depression Mother    Coronary artery disease Father    Heart attack Father    Hypertension Father    Heart disease Father    Early death Father    Cancer Other        leukemia   Stroke Maternal Grandfather    Alcohol abuse Maternal Grandfather    Hyperlipidemia Brother        Amyloidosis   Cancer Brother    Hypertension Brother    Diabetes Maternal Grandmother    Cancer Paternal Uncle        unknown   Heart attack Paternal Grandmother    Heart attack Paternal Uncle    Hypertension Brother    Diabetes Daughter        borderline   Diabetes Sister    Heart disease Sister    Kidney disease Sister    Heart disease Brother    Colon cancer Neg Hx    Esophageal cancer Neg Hx    Rectal cancer Neg Hx    Stomach cancer Neg Hx     Past Surgical History:  Procedure Laterality Date   APPENDECTOMY     BREAST BIOPSY Left 10/2017   BREAST BIOPSY Right 03/2019   BREAST BIOPSY Right 08/26/2023   US  RT BREAST BX W LOC DEV 1ST LESION IMG BX SPEC US  GUIDE 08/26/2023 [facility]   BREAST LUMPECTOMY Right 2020   BREAST LUMPECTOMY WITH RADIOACTIVE SEED AND SENTINEL LYMPH NODE BIOPSY Right 04/15/2019   Procedure: RIGHT BREAST PARTIAL MASTECTOMY WITH RADIOACTIVE SEED AND SENTINEL LYMPH NODE BIOPSY;  Surgeon: Vernetta Berg, MD;  Location: [facility];  Service: General;  Laterality: Right;   CESAREAN SECTION     x2 ? w/appy   CHOLECYSTECTOMY     ESOPHAGOGASTRODUODENOSCOPY     HERNIA REPAIR     INTRAMEDULLARY (IM) NAIL INTERTROCHANTERIC Left 11/27/2023   Procedure: LEFT OPEN REDUCTION INTERNAL FIXATION (ORIF) INTERTROCHANTOR;  Surgeon: Jerri Kay HERO, MD;  Location: [facility];  Service: Orthopedics;  Laterality: Left;   KNEE ARTHROSCOPY Right 11/2005   LIVER BIOPSY     PANCREATIC CYST EXCISION     PORT-A-CATH REMOVAL Left 04/25/2020   Procedure: REMOVAL PORT-A-CATH;  Surgeon: Vernetta Berg, MD;  Location: [facility];  Service: General;  Laterality: Left;   PORTACATH  PLACEMENT Left 04/15/2019   Procedure: INSERTION PORT-A-CATH WITH ULTRASOUND;  Surgeon: Vernetta Berg, MD;  Location: [facility];  Service: General;  Laterality: Left;   RIGHT/LEFT HEART CATH AND CORONARY ANGIOGRAPHY N/A 09/28/2019   Procedure: RIGHT/LEFT HEART CATH AND CORONARY ANGIOGRAPHY;  Surgeon: Rolan Ezra RAMAN, MD;  Location: [facility] INVASIVE CV LAB;  Service: Cardiovascular;  Laterality: N/A;   SPLENECTOMY     TONSILLECTOMY AND ADENOIDECTOMY     TUBAL LIGATION  06/24/78   Social History   Occupational History   Occupation: CSR    Employer: [employer]   Occupation: retired  Tobacco Use   Smoking status: Never   Smokeless tobacco: Never  Vaping Use   Vaping status: Never Used  Substance and Sexual Activity   Alcohol use: No   Drug use: No   Sexual activity: Not Currently    Birth control/protection: Pill    Comment: Tubes tied

## 2024-02-05 ENCOUNTER — Encounter: Payer: Self-pay | Admitting: Family

## 2024-02-06 ENCOUNTER — Other Ambulatory Visit: Payer: Self-pay

## 2024-02-06 ENCOUNTER — Telehealth: Payer: Self-pay | Admitting: *Deleted

## 2024-02-06 ENCOUNTER — Encounter (HOSPITAL_BASED_OUTPATIENT_CLINIC_OR_DEPARTMENT_OTHER): Payer: Self-pay | Admitting: Emergency Medicine

## 2024-02-06 ENCOUNTER — Ambulatory Visit (INDEPENDENT_AMBULATORY_CARE_PROVIDER_SITE_OTHER): Admitting: Family

## 2024-02-06 ENCOUNTER — Ambulatory Visit: Payer: Self-pay | Admitting: Family

## 2024-02-06 ENCOUNTER — Emergency Department (HOSPITAL_BASED_OUTPATIENT_CLINIC_OR_DEPARTMENT_OTHER)

## 2024-02-06 ENCOUNTER — Emergency Department (HOSPITAL_BASED_OUTPATIENT_CLINIC_OR_DEPARTMENT_OTHER)
Admission: EM | Admit: 2024-02-06 | Discharge: 2024-02-06 | Disposition: A | Discharge status: Home / Self Care | Source: Skilled Nursing Facility | Attending: Emergency Medicine | Admitting: Emergency Medicine

## 2024-02-06 ENCOUNTER — Encounter: Payer: Self-pay | Admitting: Family

## 2024-02-06 VITALS — BP 120/76 | HR 97 | Ht 62.0 in | Wt 128.7 lb

## 2024-02-06 DIAGNOSIS — R634 Abnormal weight loss: Secondary | ICD-10-CM

## 2024-02-06 DIAGNOSIS — Z7984 Long term (current) use of oral hypoglycemic drugs: Secondary | ICD-10-CM | POA: Diagnosis not present

## 2024-02-06 DIAGNOSIS — E119 Type 2 diabetes mellitus without complications: Secondary | ICD-10-CM | POA: Diagnosis not present

## 2024-02-06 DIAGNOSIS — Z79899 Other long term (current) drug therapy: Secondary | ICD-10-CM | POA: Diagnosis not present

## 2024-02-06 DIAGNOSIS — I509 Heart failure, unspecified: Secondary | ICD-10-CM | POA: Diagnosis not present

## 2024-02-06 DIAGNOSIS — R109 Unspecified abdominal pain: Secondary | ICD-10-CM

## 2024-02-06 DIAGNOSIS — Z853 Personal history of malignant neoplasm of breast: Secondary | ICD-10-CM | POA: Diagnosis not present

## 2024-02-06 DIAGNOSIS — Z711 Person with feared health complaint in whom no diagnosis is made: Secondary | ICD-10-CM | POA: Insufficient documentation

## 2024-02-06 DIAGNOSIS — E860 Dehydration: Secondary | ICD-10-CM | POA: Diagnosis not present

## 2024-02-06 DIAGNOSIS — I110 Hypertensive heart disease with heart failure: Secondary | ICD-10-CM | POA: Insufficient documentation

## 2024-02-06 DIAGNOSIS — R1013 Epigastric pain: Secondary | ICD-10-CM | POA: Diagnosis not present

## 2024-02-06 DIAGNOSIS — Z7982 Long term (current) use of aspirin: Secondary | ICD-10-CM | POA: Diagnosis not present

## 2024-02-06 DIAGNOSIS — Z794 Long term (current) use of insulin: Secondary | ICD-10-CM | POA: Diagnosis not present

## 2024-02-06 DIAGNOSIS — R197 Diarrhea, unspecified: Secondary | ICD-10-CM

## 2024-02-06 DIAGNOSIS — K5730 Diverticulosis of large intestine without perforation or abscess without bleeding: Secondary | ICD-10-CM | POA: Diagnosis not present

## 2024-02-06 DIAGNOSIS — J45909 Unspecified asthma, uncomplicated: Secondary | ICD-10-CM | POA: Insufficient documentation

## 2024-02-06 DIAGNOSIS — N281 Cyst of kidney, acquired: Secondary | ICD-10-CM | POA: Diagnosis not present

## 2024-02-06 DIAGNOSIS — N3000 Acute cystitis without hematuria: Secondary | ICD-10-CM | POA: Diagnosis not present

## 2024-02-06 LAB — URINALYSIS, W/ REFLEX TO CULTURE (INFECTION SUSPECTED)
Bilirubin Urine: NEGATIVE
Glucose, UA: >=500 mg/dL — AB
Hgb urine dipstick: NEGATIVE
Ketones, ur: NEGATIVE mg/dL
Nitrite: NEGATIVE
Protein, ur: NEGATIVE mg/dL
Specific Gravity, Urine: 1.01 (ref 1.005–1.030)
pH: 5.5 (ref 5.0–8.0)

## 2024-02-06 LAB — CBC WITH DIFFERENTIAL/PLATELET
Abs Immature Granulocytes: 0.03 K/uL (ref 0.00–0.07)
Basophils Absolute: 0.1 K/uL (ref 0.0–0.1)
Basophils Absolute: 0.1 K/uL (ref 0.0–0.1)
Basophils Relative: 0.5 % (ref 0.0–3.0)
Basophils Relative: 0 %
Eosinophils Absolute: 0.2 K/uL (ref 0.0–0.7)
Eosinophils Absolute: 0.3 K/uL (ref 0.0–0.5)
Eosinophils Relative: 1.8 % (ref 0.0–5.0)
Eosinophils Relative: 2 %
HCT: 35.6 % — ABNORMAL LOW (ref 36.0–46.0)
HCT: 32.3 % — ABNORMAL LOW (ref 36.0–46.0)
Hemoglobin: 11.4 g/dL — ABNORMAL LOW (ref 12.0–15.0)
Hemoglobin: 10.4 g/dL — ABNORMAL LOW (ref 12.0–15.0)
Immature Granulocytes: 0 %
Lymphocytes Relative: 29.2 % (ref 12.0–46.0)
Lymphocytes Relative: 38 %
Lymphs Abs: 3.7 K/uL (ref 0.7–4.0)
Lymphs Abs: 5.3 K/uL — ABNORMAL HIGH (ref 0.7–4.0)
MCH: 28.8 pg (ref 26.0–34.0)
MCHC: 32.1 g/dL (ref 30.0–36.0)
MCHC: 32.2 g/dL (ref 30.0–36.0)
MCV: 89.1 fl (ref 78.0–100.0)
MCV: 89.5 fL (ref 80.0–100.0)
Monocytes Absolute: 1 K/uL (ref 0.1–1.0)
Monocytes Absolute: 1.5 K/uL — ABNORMAL HIGH (ref 0.1–1.0)
Monocytes Relative: 8.2 % (ref 3.0–12.0)
Monocytes Relative: 11 %
Neutro Abs: 7.6 K/uL (ref 1.4–7.7)
Neutro Abs: 6.9 K/uL (ref 1.7–7.7)
Neutrophils Relative %: 60.3 % (ref 43.0–77.0)
Neutrophils Relative %: 49 %
Platelets: 439 K/uL — ABNORMAL HIGH (ref 150.0–400.0)
Platelets: 439 K/uL — ABNORMAL HIGH (ref 150–400)
RBC: 4 Mil/uL (ref 3.87–5.11)
RBC: 3.61 MIL/uL — ABNORMAL LOW (ref 3.87–5.11)
RDW: 13.9 % (ref 11.5–15.5)
RDW: 13.7 % (ref 11.5–15.5)
Smear Review: NORMAL
WBC: 12.5 K/uL — ABNORMAL HIGH (ref 4.0–10.5)
WBC: 14.2 K/uL — ABNORMAL HIGH (ref 4.0–10.5)
nRBC: 0 % (ref 0.0–0.2)

## 2024-02-06 LAB — COMPREHENSIVE METABOLIC PANEL WITH GFR
ALT: 12 U/L (ref 0–35)
ALT: 13 U/L (ref 0–44)
AST: 13 U/L (ref 0–37)
AST: 15 U/L (ref 15–41)
Albumin: 4.2 g/dL (ref 3.5–5.2)
Albumin: 4.1 g/dL (ref 3.5–5.0)
Alkaline Phosphatase: 76 U/L (ref 39–117)
Alkaline Phosphatase: 81 U/L (ref 38–126)
Anion gap: 15 (ref 5–15)
BUN: 31 mg/dL — ABNORMAL HIGH (ref 6–23)
BUN: 29 mg/dL — ABNORMAL HIGH (ref 8–23)
CO2: 26 meq/L (ref 19–32)
CO2: 20 mmol/L — ABNORMAL LOW (ref 22–32)
Calcium: 10.2 mg/dL (ref 8.4–10.5)
Calcium: 9.7 mg/dL (ref 8.9–10.3)
Chloride: 104 meq/L (ref 96–112)
Chloride: 104 mmol/L (ref 98–111)
Creatinine, Ser: 1.35 mg/dL — ABNORMAL HIGH (ref 0.40–1.20)
Creatinine, Ser: 1.18 mg/dL — ABNORMAL HIGH (ref 0.44–1.00)
GFR, Estimated: 49 mL/min — ABNORMAL LOW (ref >60)
GFR: 38.94 mL/min — ABNORMAL LOW (ref >60.00)
Glucose, Bld: 125 mg/dL — ABNORMAL HIGH (ref 70–99)
Glucose, Bld: 181 mg/dL — ABNORMAL HIGH (ref 70–99)
Potassium: 6.2 meq/L (ref 3.5–5.1)
Potassium: 4.6 mmol/L (ref 3.5–5.1)
Sodium: 140 meq/L (ref 135–145)
Sodium: 139 mmol/L (ref 135–145)
Total Bilirubin: 0.3 mg/dL (ref 0.2–1.2)
Total Bilirubin: <0.2 mg/dL (ref 0.0–1.2)
Total Protein: 6.3 g/dL (ref 6.0–8.3)
Total Protein: 6.3 g/dL — ABNORMAL LOW (ref 6.5–8.1)

## 2024-02-06 LAB — LIPASE, BLOOD: Lipase: 12 U/L (ref 11–51)

## 2024-02-06 LAB — AMYLASE: Amylase: 25 U/L — ABNORMAL LOW (ref 27–131)

## 2024-02-06 LAB — LIPASE: Lipase: 7 U/L — ABNORMAL LOW (ref 11.0–59.0)

## 2024-02-06 MED ORDER — CEFDINIR 300 MG PO CAPS
300.0000 mg | ORAL_CAPSULE | Freq: Two times a day (BID) | ORAL | 0 refills | Status: AC
Start: 2024-02-06 — End: 2024-02-13

## 2024-02-06 MED ORDER — PANTOPRAZOLE SODIUM 40 MG PO TBEC
40.0000 mg | DELAYED_RELEASE_TABLET | Freq: Two times a day (BID) | ORAL | 0 refills | Status: DC
Start: 2024-02-06 — End: 2024-05-12

## 2024-02-06 MED ORDER — IOHEXOL 300 MG/ML  SOLN
100.0000 mL | Freq: Once | INTRAMUSCULAR | Status: AC | PRN
  Administered 2024-02-06: 100 mL via INTRAVENOUS

## 2024-02-06 MED ORDER — SODIUM CHLORIDE 0.9 % IV BOLUS
1000.0000 mL | Freq: Once | INTRAVENOUS | Status: AC
  Administered 2024-02-06: 1000 mL via INTRAVENOUS

## 2024-02-06 NOTE — Discharge Instructions (Addendum)
 We evaluated you for your abnormal lab test.  We rechecked your testing here, your kidney test did suggest that you were mildly dehydrated, however your potassium level was normal.  I suspect that your potassium level from earlier today was a laboratory error.  This is very common.  Your testing did show that you have signs of a urine infection.  Since you have had some urinary symptoms we have prescribed you an antibiotic.  Please take this as prescribed.  Please be sure that you are drinking lots of fluids.  Your CT scan showed some nonspecific findings like a cyst on your kidney and a benign adrenal nodule.  Please discuss this with your primary doctor.  The radiologist recommends getting a kidney ultrasound at some point.  Please follow-up with your primary doctor and return to the emergency department if you have any new or worsening symptoms.

## 2024-02-06 NOTE — Telephone Encounter (Signed)
 CRITICAL VALUE STICKER  CRITICAL VALUE: Potassium 6.2  MESSENGER (representative from lab): [id number]

## 2024-02-06 NOTE — Telephone Encounter (Signed)
 Spoke with pt, advised pt the importance of heading to the ED. Pt stated she is currently busy and I may not be able to go until tomorrow again advised pt her Potassium is critically high and it is important for pt to be seen. Pt is aware and expressed understanding.

## 2024-02-06 NOTE — Progress Notes (Signed)
 Margaret Norman is a 73 y.o. female with the following history as recorded in [employer]:  Patient Active Problem List   Diagnosis Date Noted   Nondisplaced intertrochanteric fracture of left femur, initial encounter for closed fracture (HCC) 11/24/2023   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   Port-A-Cath in place 06/10/2019   Breast cancer (HCC) 04/15/2019   Second degree AV block    Malignant neoplasm of upper-outer quadrant of right breast in female, estrogen receptor positive (HCC) 03/25/2019   Thyromegaly 01/10/2018   Overweight (BMI 25.0-29.9) 08/13/2017   Upper respiratory tract infection 07/30/2017   Type 2 diabetes mellitus with hyperglycemia, with long-term current use of insulin  (HCC) 03/19/2017   Thiamine  deficiency 01/04/2017   Tinnitus aurium, 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   Deficiency anemia 04/30/2016   Routine general medical examination at a health care facility 04/27/2015   Abdominal wall hernia 04/26/2015   Family history of hemochromatosis 09/13/2014   Acute maxillary sinusitis 05/14/2014   History of colonic polyps 03/10/2014   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   Lumbosacral spondylosis without myelopathy 05/12/2013   Insomnia, persistent 02/04/2013   Obesity (BMI 30-39.9) 02/04/2013   Asthma with exacerbation 09/22/2012   Visit for screening mammogram 07/04/2012   Nephrolithiasis    HTN (hypertension)    Gallstones    LBBB (left bundle branch block)    Leukocytosis 12/20/2008   Hyperlipidemia with target LDL less than 100 09/04/2007   Chronic depression 09/04/2007   GERD 09/04/2007    Current Outpatient Medications  Medication Sig Dispense Refill   acetaminophen  (TYLENOL ) 500 MG tablet Take 1,000 mg by mouth every 6 (six) hours as needed for moderate pain.     anastrozole  (ARIMIDEX ) 1 MG tablet Take 1 tablet (1 mg total) by mouth daily. 90 tablet 3   aspirin 81 MG EC tablet Take 81 mg by mouth daily.     B Complex-C (SUPER B COMPLEX PO) Take 1 tablet  by mouth daily.     buPROPion  (WELLBUTRIN  XL) 300 MG 24 hr tablet Take 1 tablet (300 mg total) by mouth every morning. 90 tablet 1   carvedilol  (COREG ) 12.5 MG tablet Take 1 tablet (12.5 mg total) by mouth 2 (two) times daily with a meal. 180 tablet 3   Cholecalciferol  (VITAMIN D ) 50 MCG (2000 UT) tablet Take 2,000 Units by mouth daily.     Continuous Blood Gluc Receiver (FREESTYLE LIBRE 2 READER) DEVI 1 each by Does not apply route daily. 6 each 3   Continuous Glucose Sensor (FREESTYLE LIBRE 2 SENSOR) MISC APPLY AS DIRECTED EVERY 14 DAYS 6 each 3   Dulaglutide  (TRULICITY ) 3 MG/0.5ML SOAJ Inject 3 mg into the skin once a week. (Patient taking differently: Inject 3 mg into the skin once a week. ON SATURDAYS) 6 mL 3   EASY COMFORT PEN NEEDLES 32G X 4 MM MISC USE AS DIRECTED EVERY DAY 100 each 3   ENTRESTO  24-26 MG TAKE 1 TABLET BY MOUTH TWICE DAILY 60 tablet 11   furosemide  (LASIX ) 40 MG tablet Take 1 tablet (40 mg total) by mouth as needed. For weight gain of 3ln in 24 hours or 5lb in a week     HYDROcodone -acetaminophen  (NORCO/VICODIN) 5-325 MG tablet Take 1 tablet by mouth every 6 (six) hours as needed for moderate pain (pain score 4-6). 30 tablet 0   JARDIANCE  25 MG TABS tablet TAKE 1 TABLET BY MOUTH DAILY 90 tablet 3   LANTUS  SOLOSTAR 100 UNIT/ML Solostar Pen INJECT SUBCUTANEOUSLY 20 UNITS AT BEDTIME 30  mL 3   meclizine  (ANTIVERT ) 25 MG tablet Take 25 mg by mouth 3 (three) times daily as needed for dizziness.     metFORMIN  (GLUCOPHAGE ) 1000 MG tablet TAKE 1 TABLET BY MOUTH TWICE DAILY WITH A MEAL 180 tablet 3   pantoprazole  (PROTONIX ) 40 MG tablet Take 1 tablet (40 mg total) by mouth 2 (two) times daily. 180 tablet 0   Probiotic Product (PROBIOTIC ADVANCED PO) Take 1 tablet by mouth in the morning and at bedtime.     repaglinide  (PRANDIN ) 1 MG tablet Take 1 tablet (1 mg total) by mouth daily before supper.     rosuvastatin  (CRESTOR ) 10 MG tablet TAKE 1 TABLET BY MOUTH DAILY 30 tablet 11    sertraline  (ZOLOFT ) 100 MG tablet Take 1 tablet (100 mg total) by mouth daily. 90 tablet 3   spironolactone  (ALDACTONE ) 25 MG tablet TAKE 1 TABLET BY MOUTH DAILY 90 tablet 3   Vitamin E 180 MG CAPS Take 180 mg by mouth at bedtime.      zinc  gluconate 50 MG tablet Take 50 mg by mouth daily.     No current facility-administered medications for this visit.    Allergies: Invokana  [canagliflozin ] and Amoxicillin   Past Medical History:  Diagnosis Date   Allergy AMOXICILLAN   Arthritis    Chest pain    Nuclear, adenosine ,  December, 2013, low risk nuclear scan with small, moderate in intensity, fixed anteroseptal defect. This is possibly related to an LBBB versus small prior infarct. 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   Type II or unspecified type diabetes mellitus without mention of complication, not stated as uncontrolled     Past Surgical History:  Procedure Laterality Date   APPENDECTOMY     BREAST BIOPSY Left 10/2017   BREAST BIOPSY Right 03/2019   BREAST BIOPSY Right 08/26/2023   US  RT BREAST BX W LOC DEV 1ST LESION IMG BX SPEC US  GUIDE 08/26/2023 [facility]MO[facility]   BREAST LUMPECTOMY Right 2020   BREAST LUMPECTOMY WITH RADIOACTIVE SEED AND SENTINEL LYMPH NODE BIOPSY Right 04/15/2019   Procedure: RIGHT BREAST PARTIAL MASTECTOMY WITH RADIOACTIVE SEED AND SENTINEL LYMPH NODE BIOPSY;  Surgeon: Vernetta Berg, MD;  Location: [facility];  Service: General;  Laterality: Right;   CESAREAN SECTION     x2 ? w/appy   CHOLECYSTECTOMY     ESOPHAGOGASTRODUODENOSCOPY     HERNIA REPAIR     INTRAMEDULLARY (IM) NAIL INTERTROCHANTERIC Left 11/27/2023   Procedure: LEFT OPEN REDUCTION INTERNAL FIXATION  (ORIF) INTERTROCHANTOR;  Surgeon: Jerri Kay HERO, MD;  Location: [facility];  Service: Orthopedics;  Laterality: Left;   KNEE ARTHROSCOPY Right 11/2005   LIVER BIOPSY     PANCREATIC CYST EXCISION     PORT-A-CATH REMOVAL Left 04/25/2020   Procedure: REMOVAL PORT-A-CATH;  Surgeon: Vernetta Berg, MD;  Location: [facility];  Service: General;  Laterality: Left;   PORTACATH PLACEMENT Left 04/15/2019   Procedure: INSERTION PORT-A-CATH WITH ULTRASOUND;  Surgeon: Vernetta Berg, MD;  Location: [facility];  Service: General;  Laterality: Left;   RIGHT/LEFT HEART CATH AND CORONARY ANGIOGRAPHY N/A 09/28/2019   Procedure: RIGHT/LEFT HEART CATH AND CORONARY  ANGIOGRAPHY;  Surgeon: Rolan Ezra RAMAN, MD;  Location: [facility] INVASIVE CV LAB;  Service: Cardiovascular;  Laterality: N/A;   SPLENECTOMY     TONSILLECTOMY AND ADENOIDECTOMY     TUBAL LIGATION  06/24/78    Family History  Problem Relation Age of Onset   Liver disease Mother    Dementia Mother    Diabetes Mother        borderline   Depression Mother    Coronary artery disease Father    Heart attack Father    Hypertension Father    Heart disease Father    Early death Father    Cancer Other        leukemia   Stroke Maternal Grandfather    Alcohol abuse Maternal Grandfather    Hyperlipidemia Brother        Amyloidosis   Cancer Brother    Hypertension Brother    Diabetes Maternal Grandmother    Cancer Paternal Uncle        unknown   Heart attack Paternal Grandmother    Heart attack Paternal Uncle    Hypertension Brother    Diabetes Daughter        borderline   Diabetes Sister    Heart disease Sister    Kidney disease Sister    Heart disease Brother    Colon cancer Neg Hx    Esophageal cancer Neg Hx    Rectal cancer Neg Hx    Stomach cancer Neg Hx     Social History   Tobacco Use   Smoking status: Never   Smokeless tobacco: Never  Substance Use Topics   Alcohol use: No    Subjective:   Concerned about changes in  bowel habits/ increased nausea; accompanied by daughter today; notes that symptoms have been present on and off for the past year; notes that appetite has been down- just eat to eat; sense of pressure in abdomen; increased belching- does respond to TUMS; patient notes she is concerned that she has pancreatic cancer and would like to make sure this is checked;     Objective:  Vitals:   02/06/24 0954  BP: 120/76  Pulse: 97  SpO2: 97%  Weight: 128 lb 11.2 oz (58.4 kg)  Height: 5' 2 (1.575 m)    General: Well developed, well nourished, in no acute distress  Skin : Warm and dry.  Head: Normocephalic and atraumatic  Eyes: Sclera and conjunctiva clear; pupils round and reactive to light; extraocular movements intact  Lungs: Respirations unlabored; clear to auscultation bilaterally without wheeze, rales, rhonchi  CVS exam: normal rate and regular rhythm.  Abdomen: Soft; nontender; nondistended; normoactive bowel sounds; no masses or hepatosplenomegaly  Musculoskeletal: No deformities; no active joint inflammation  Extremities: No edema, cyanosis, clubbing  Vessels: Symmetric bilaterally  Neurologic: Alert and oriented; speech intact; face symmetrical; moves all extremities well; CNII-XII intact without focal deficit   Assessment:  1. Diarrhea, unspecified type   2. Weight loss   3. Abdominal pain, unspecified abdominal location     Plan:  ? Etiology; will update abdominal/ pelvic CT- to consider abdominal MRI; will change Omeprazole  to Pantoprazole ; refer to GI for further evaluation;   Patient is also encouraged to follow up with her cardiologist- was due for follow up there in July 2025;   No follow-ups on file.  Orders Placed This Encounter  Procedures   CT ABDOMEN PELVIS W CONTRAST    Standing Status:   Future    Expiration Date:   02/05/2025  If indicated for the ordered procedure, I authorize the administration of contrast media per Radiology protocol:   Yes    Does the  patient have a contrast media/X-ray dye allergy?:   No    Preferred imaging location?:   [facility]    If indicated for the ordered procedure, I authorize the administration of oral contrast media per Radiology protocol:   Yes   CBC with Differential/Platelet   Comp Met (CMET)   Amylase   Lipase   Ambulatory referral to Gastroenterology    Referral Priority:   Routine    Referral Type:   Consultation    Referral Reason:   Specialty Services Required    Number of Visits Requested:   1    Requested Prescriptions   Signed Prescriptions Disp Refills   pantoprazole  (PROTONIX ) 40 MG tablet 180 tablet 0    Sig: Take 1 tablet (40 mg total) by mouth 2 (two) times daily.

## 2024-02-06 NOTE — Progress Notes (Signed)
 Patient was notified of critical potassium value and stressed need to go to ER today.

## 2024-02-06 NOTE — ED Triage Notes (Signed)
 Pt reports being seen at PCP this AM, labs drawn, K=6.2, sent to ED; denies CP or ShoB

## 2024-02-06 NOTE — ED Provider Notes (Signed)
 [place] [facility] Provider Note  CSN: [id number] Arrival date & time: 02/06/24 1515  Chief Complaint(s) Hyperkalemia  HPI Margaret Norman is a 73 y.o. female history of hypertension, hyperlipidemia, diabetes presenting with abnormal lab.  Patient reports she saw her primary doctor today because she has been having decreased appetite, early satiety, not wanting to eat or drink much.  Reports some epigastric pain also.  Reports some nausea.  No vomiting.  Not sure about weight loss.  No diarrhea.  Feels like she is urinating a lot less.  Labs showed potassium of 6.2 and AKI with no hemolysis per lab.   Past Medical History Past Medical History:  Diagnosis Date   Allergy AMOXICILLAN   Arthritis    Chest pain    Nuclear, adenosine ,  December, 2013, low risk nuclear scan with small, moderate in intensity, fixed anteroseptal defect. This is possibly related to an LBBB versus small prior infarct. 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   Type II or unspecified type diabetes mellitus without mention of complication, not stated as uncontrolled    Patient Active Problem List   Diagnosis Date Noted   Nondisplaced intertrochanteric fracture of left femur, initial encounter for closed fracture (HCC) 11/24/2023   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   Port-A-Cath in place 06/10/2019   Breast cancer (HCC) 04/15/2019   Second degree AV block    Malignant neoplasm of upper-outer quadrant of right breast in female, estrogen receptor positive (HCC) 03/25/2019   Thyromegaly 01/10/2018   Overweight (BMI 25.0-29.9) 08/13/2017   Upper respiratory tract infection  07/30/2017   Type 2 diabetes mellitus with hyperglycemia, with long-term current use of insulin  (HCC) 03/19/2017   Thiamine  deficiency 01/04/2017   Tinnitus aurium, 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   Deficiency anemia 04/30/2016   Routine general medical examination at a health care facility 04/27/2015   Abdominal wall hernia 04/26/2015   Family history of hemochromatosis 09/13/2014   Acute maxillary sinusitis 05/14/2014   History of colonic polyps 03/10/2014   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 Authorizing Provider  cefdinir  (OMNICEF ) 300 MG capsule Take 1 capsule (300 mg total) by mouth 2 (two) times daily for 7 days. 02/06/24 02/13/24 Yes Francesca Elsie CROME, MD  acetaminophen  (TYLENOL ) 500 MG tablet Take 1,000 mg by mouth every 6 (six) hours as needed for moderate pain.    [provider]  anastrozole  (ARIMIDEX ) 1 MG tablet Take 1 tablet (1 mg total) by mouth daily. 05/10/23   Gudena, Vinay, MD  aspirin 81 MG EC tablet Take 81 mg by mouth daily.    [provider]  B Complex-C (SUPER B COMPLEX PO) Take 1 tablet by mouth daily.    [provider]  buPROPion  (WELLBUTRIN  XL) 300 MG 24 hr tablet Take 1 tablet (300 mg total) by mouth every morning. 03/21/23   Jason Leita Repine, FNP  carvedilol  (COREG ) 12.5 MG tablet Take 1 tablet (12.5 mg total) by mouth 2 (two) times daily with a meal. 10/15/23   [place], Harlene HERO, FNP  Cholecalciferol   (VITAMIN D ) 50 MCG (2000 UT) tablet Take 2,000 Units by mouth daily.    [provider]  Continuous Blood Gluc Receiver (FREESTYLE LIBRE 2 READER) DEVI 1 each by Does not apply route daily. 01/17/22   Trixie File, MD  Continuous Glucose Sensor (FREESTYLE LIBRE 2 SENSOR) MISC APPLY AS DIRECTED EVERY 14 DAYS 05/03/23   Trixie File, MD  Dulaglutide  (TRULICITY ) 3 MG/0.5ML SOAJ Inject 3 mg into the skin once a week. Patient taking differently: Inject 3 mg into the skin once a week. ON SATURDAYS 06/24/23   Trixie File, MD  EASY COMFORT PEN NEEDLES 32G X 4 MM MISC USE AS DIRECTED EVERY DAY 05/27/23   Trixie File, MD  ENTRESTO  24-26 MG TAKE 1 TABLET BY MOUTH TWICE DAILY 09/12/23   McLean, Dalton S, MD  furosemide  (LASIX ) 40 MG tablet Take 1 tablet (40 mg total) by mouth as needed. For weight gain of 3ln in 24 hours or 5lb in a week 05/16/23 09/12/24  Rolan Ezra RAMAN, MD  HYDROcodone -acetaminophen  (NORCO/VICODIN) 5-325 MG tablet Take 1 tablet by mouth every 6 (six) hours as needed for moderate pain (pain score 4-6). 11/28/23   Jule Ronal CROME, PA-C  JARDIANCE  25 MG TABS tablet TAKE 1 TABLET BY MOUTH DAILY 11/12/23   Trixie File, MD  LANTUS  SOLOSTAR 100 UNIT/ML Solostar Pen INJECT SUBCUTANEOUSLY 20 UNITS AT BEDTIME 10/25/23   Trixie File, MD  meclizine  (ANTIVERT ) 25 MG tablet Take 25 mg by mouth 3 (three) times daily as needed for dizziness.    [provider]  metFORMIN  (GLUCOPHAGE ) 1000 MG tablet TAKE 1 TABLET BY MOUTH TWICE DAILY WITH A MEAL 10/23/23   Trixie File, MD  pantoprazole  (PROTONIX ) 40 MG tablet Take 1 tablet (40 mg total) by mouth 2 (two) times daily. 02/06/24   Jason Leita Repine, FNP  Probiotic Product (PROBIOTIC ADVANCED PO) Take 1 tablet by mouth in the morning and at bedtime.    [provider]  repaglinide  (PRANDIN ) 1 MG tablet Take 1 tablet (1 mg total) by mouth daily before supper. 06/24/23   Trixie File, MD   rosuvastatin  (CRESTOR ) 10 MG tablet TAKE 1 TABLET BY MOUTH DAILY 08/13/23   Jason Leita Repine, FNP  sertraline  (ZOLOFT ) 100 MG tablet Take 1 tablet (100 mg total) by mouth daily. 03/21/23   Jason Leita Repine, FNP  spironolactone  (ALDACTONE ) 25 MG tablet TAKE 1 TABLET BY MOUTH DAILY 01/08/24   McLean, Dalton S, MD  Vitamin E 180 MG CAPS Take 180 mg by mouth at bedtime.     [provider]  zinc  gluconate 50 MG tablet Take 50 mg by mouth daily.    [provider]  Past Surgical History Past Surgical History:  Procedure Laterality Date   APPENDECTOMY     BREAST BIOPSY Left 10/2017   BREAST BIOPSY Right 03/2019   BREAST BIOPSY Right 08/26/2023   US  RT BREAST BX W LOC DEV 1ST LESION IMG BX SPEC US  GUIDE 08/26/2023 [facility]   BREAST LUMPECTOMY Right 2020   BREAST LUMPECTOMY WITH RADIOACTIVE SEED AND SENTINEL LYMPH NODE BIOPSY Right 04/15/2019   Procedure: RIGHT BREAST PARTIAL MASTECTOMY WITH RADIOACTIVE SEED AND SENTINEL LYMPH NODE BIOPSY;  Surgeon: Vernetta Berg, MD;  Location: [facility] OR;  Service: General;  Laterality: Right;   CESAREAN SECTION     x2 ? w/appy   CHOLECYSTECTOMY     ESOPHAGOGASTRODUODENOSCOPY     HERNIA REPAIR     INTRAMEDULLARY (IM) NAIL INTERTROCHANTERIC Left 11/27/2023   Procedure: LEFT OPEN REDUCTION INTERNAL FIXATION (ORIF) INTERTROCHANTOR;  Surgeon: Jerri Kay HERO, MD;  Location: [facility] OR;  Service: Orthopedics;  Laterality: Left;   KNEE ARTHROSCOPY Right 11/2005   LIVER BIOPSY     PANCREATIC CYST EXCISION     PORT-A-CATH REMOVAL Left 04/25/2020   Procedure: REMOVAL PORT-A-CATH;  Surgeon: Vernetta Berg, MD;  Location: [facility];  Service: General;  Laterality: Left;   PORTACATH PLACEMENT Left 04/15/2019   Procedure: INSERTION PORT-A-CATH WITH ULTRASOUND;  Surgeon: Vernetta Berg, MD;   Location: [facility] OR;  Service: General;  Laterality: Left;   RIGHT/LEFT HEART CATH AND CORONARY ANGIOGRAPHY N/A 09/28/2019   Procedure: RIGHT/LEFT HEART CATH AND CORONARY ANGIOGRAPHY;  Surgeon: Rolan Ezra RAMAN, MD;  Location: [facility] INVASIVE CV [facility];  Service: Cardiovascular;  Laterality: N/A;   SPLENECTOMY     TONSILLECTOMY AND ADENOIDECTOMY     TUBAL LIGATION  06/24/78   Family History Family History  Problem Relation Age of Onset   Liver disease Mother    Dementia Mother    Diabetes Mother        borderline   Depression Mother    Coronary artery disease Father    Heart attack Father    Hypertension Father    Heart disease Father    Early death Father    Cancer Other        leukemia   Stroke Maternal Grandfather    Alcohol abuse Maternal Grandfather    Hyperlipidemia Brother        Amyloidosis   Cancer Brother    Hypertension Brother    Diabetes Maternal Grandmother    Cancer Paternal Uncle        unknown   Heart attack Paternal Grandmother    Heart attack Paternal Uncle    Hypertension Brother    Diabetes Daughter        borderline   Diabetes Sister    Heart disease Sister    Kidney disease Sister    Heart disease Brother    Colon cancer Neg Hx    Esophageal cancer Neg Hx    Rectal cancer Neg Hx    Stomach cancer Neg Hx     Social History Social History   Tobacco Use   Smoking status: Never   Smokeless tobacco: Never  Vaping Use   Vaping status: Never Used  Substance Use Topics   Alcohol use: No   Drug use: No   Allergies Invokana  [canagliflozin ] and Amoxicillin   Review of Systems Review of Systems  All other systems reviewed and are negative.   Physical Exam Vital Signs  I have reviewed the triage vital signs BP 114/62   Pulse 81  Temp 98.1 F (36.7 C)   Resp 18   Ht 5' 2 (1.575 m)   Wt 58.1 kg   SpO2 98%   BMI 23.41 kg/m  Physical Exam Vitals and nursing note reviewed.  Constitutional:      General: She is not in acute distress.     Appearance: She is well-developed.  HENT:     Head: Normocephalic and atraumatic.     Mouth/Throat:     Mouth: Mucous membranes are moist.  Eyes:     Pupils: Pupils are equal, round, and reactive to light.  Cardiovascular:     Rate and Rhythm: Normal rate and regular rhythm.     Heart sounds: No murmur heard. Pulmonary:     Effort: Pulmonary effort is normal. No respiratory distress.     Breath sounds: Normal breath sounds.  Abdominal:     General: Abdomen is flat.     Palpations: Abdomen is soft.     Tenderness: There is abdominal tenderness (epigastric).  Musculoskeletal:        General: No tenderness.     Right lower leg: No edema.     Left lower leg: No edema.  Skin:    General: Skin is warm and dry.  Neurological:     General: No focal deficit present.     Mental Status: She is alert. Mental status is at baseline.  Psychiatric:        Mood and Affect: Mood normal.        Behavior: Behavior normal.     ED Results and Treatments Labs (all labs ordered are listed, but only abnormal results are displayed) Labs Reviewed  CBC WITH DIFFERENTIAL/PLATELET - Abnormal; Notable for the following components:      Result Value   WBC 14.2 (*)    RBC 3.61 (*)    Hemoglobin 10.4 (*)    HCT 32.3 (*)    Platelets 439 (*)    Lymphs Abs 5.3 (*)    Monocytes Absolute 1.5 (*)    All other components within normal limits  URINALYSIS, W/ REFLEX TO CULTURE (INFECTION SUSPECTED) - Abnormal; Notable for the following components:   Glucose, UA >=500 (*)    Leukocytes,Ua TRACE (*)    Bacteria, UA MANY (*)    All other components within normal limits  COMPREHENSIVE METABOLIC PANEL WITH GFR - Abnormal; Notable for the following components:   CO2 20 (*)    Glucose, Bld 181 (*)    BUN 29 (*)    Creatinine, Ser 1.18 (*)    Total Protein 6.3 (*)    GFR, Estimated 49 (*)    All other components within normal limits  URINE CULTURE  LIPASE, BLOOD                                                                                                                           Radiology CT ABDOMEN PELVIS W CONTRAST Result Date: 02/06/2024 CLINICAL DATA:  Abdominal and  flank pain with epigastric pain. EXAM: CT ABDOMEN AND PELVIS WITH CONTRAST TECHNIQUE: Multidetector CT imaging of the abdomen and pelvis was performed using the standard protocol following bolus administration of intravenous contrast. RADIATION DOSE REDUCTION: This exam was performed according to the departmental dose-optimization program which includes automated exposure control, adjustment of the mA and/or kV according to patient size and/or use of iterative reconstruction technique. CONTRAST:  OMNIPAQUE  IOHEXOL  300 MG/ML  SOLN COMPARISON:  CT abdomen and pelvis 01/21/2014. FINDINGS: Lower chest: No acute abnormality. Hepatobiliary: The gallbladder is surgically absent. There is new pneumobilia in the left lobe of the liver. No focal liver lesions are identified. 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 There are calcifications in the pancreatic head. No acute inflammation identified. Spleen: There are splenules in the left upper quadrant which have increased in size, but spleen is not visualized, unchanged. Adrenals/Urinary Tract: There is a stable 1 cm right adrenal nodule which is indeterminate, but given stability, favored as benign. Left adrenal gland is within normal limits. There are numerous bilateral renal cysts, right greater than left. The largest cyst on the right measures 4.6 cm. There is a mildly hyperdense low-attenuation area in the left kidney measuring 3 cm, new from prior. There is no hydronephrosis. The bladder appears normal. Stomach/Bowel: Stomach is within normal limits. Appendix appears normal. No evidence of bowel wall thickening, distention, or inflammatory changes. There is diffuse colonic diverticulosis. The skip Vascular/Lymphatic: Aortic atherosclerosis. No enlarged abdominal or pelvic lymph  nodes. Reproductive: Uterus and bilateral adnexa are unremarkable. Other: No abdominal wall hernia or abnormality. No abdominopelvic ascites. Musculoskeletal: Left-sided hip screws are present. Degenerative changes affect the hips and spine. IMPRESSION: 1. No acute localizing process in the abdomen or pelvis. 2. New pneumobilia in the left lobe of the liver. Patient is status post cholecystectomy. Findings may be related to prior sphincterotomy. Please correlate clinically. 3. New mildly hyperdense low-attenuation area in the left kidney measuring 3 cm. This may represent a hemorrhagic or proteinaceous cyst. Recommend further evaluation with ultrasound. 4. Colonic diverticulosis without evidence for diverticulitis. 5. Stable right adrenal nodule, favored as benign. Aortic Atherosclerosis (ICD10-I70.0). Electronically Signed   By: Greig Pique M.D.   On: 02/06/2024 18:37    Pertinent labs & imaging results that were available during my care of the patient were reviewed by me and considered in my medical decision making (see MDM for details).  Medications Ordered in ED Medications  sodium chloride  0.9 % bolus 1,000 mL (0 mLs Intravenous Stopped 02/06/24 1858)  iohexol  (OMNIPAQUE ) 300 MG/ML solution 100 mL (100 mLs Intravenous Contrast Given 02/06/24 1706)                                                                                                                                     Procedures Procedures  (including critical care time)  Medical Decision Making / ED Course   MDM:  73 year old  presenting to the emergency department with abnormal lab.  Patient overall well-appearing, vitals reassuring.  Appears euvolemic  Differential includes hyperkalemia due to AKI, hemolysis, laboratory error.  Will check labs again.  Does have abdominal pain and tenderness although seems somewhat chronic, will check CT abdomen to evaluate for any other process such as obstructive process causing  AKI  Clinical Course as of 02/06/24 1929  Thu Feb 06, 2024  1657 Potassium: 4.6 [WS]  1927 Potassium here normal.  Labs show leukocytosis however this appears chronic and per patient it is chronic.  Does have mild AKI with elevated creatinine and BUN.  She reports that she feels better after fluids.  Do not think she needs to be admitted with this low degree of AKI.  UA does show WBCs, bacteria and leukocytes.  She has had some urinary symptoms with urinary frequency and urgency although no dysuria.  Will treat for UTI which may be contributing to her symptoms.  Discussed incidental findings of kidney cyst and adrenal gland nodule with patient.  Also shows some nonspecific pneumobilia but patient has no right upper quadrant tenderness, no LFT abnormalities, doubt this is dangerous finding, likely related to prior surgery. Will discharge patient to home. All questions answered. Patient comfortable with plan of discharge. Return precautions discussed with patient and specified on the after visit summary.  [WS]    Clinical Course User Index [WS] Francesca Elsie CROME, MD     Additional history obtained: -Additional history obtained from spouse -External records from outside source obtained and reviewed including: Chart review including previous notes, labs, imaging, consultation notes including pmd notes/lab results    Lab Tests: -I ordered, reviewed, and interpreted labs.   The pertinent results include:   Labs Reviewed  CBC WITH DIFFERENTIAL/PLATELET - Abnormal; Notable for the following components:      Result Value   WBC 14.2 (*)    RBC 3.61 (*)    Hemoglobin 10.4 (*)    HCT 32.3 (*)    Platelets 439 (*)    Lymphs Abs 5.3 (*)    Monocytes Absolute 1.5 (*)    All other components within normal limits  URINALYSIS, W/ REFLEX TO CULTURE (INFECTION SUSPECTED) - Abnormal; Notable for the following components:   Glucose, UA >=500 (*)    Leukocytes,Ua TRACE (*)    Bacteria, UA MANY (*)     All other components within normal limits  COMPREHENSIVE METABOLIC PANEL WITH GFR - Abnormal; Notable for the following components:   CO2 20 (*)    Glucose, Bld 181 (*)    BUN 29 (*)    Creatinine, Ser 1.18 (*)    Total Protein 6.3 (*)    GFR, Estimated 49 (*)    All other components within normal limits  URINE CULTURE  LIPASE, BLOOD    Notable for normal potassium, chronic leukocytosis, UTI   EKG   EKG Interpretation Date/Time:  Thursday February 06 2024 15:24:31 EDT Ventricular Rate:  88 PR Interval:  199 QRS Duration:  125 QT Interval:  400 QTC Calculation: 484 R Axis:   83  Text Interpretation: Sinus rhythm Nonspecific intraventricular conduction delay Confirmed by Francesca Elsie ([id number]) on 02/06/2024 3:47:44 PM         Imaging Studies ordered: I ordered imaging studies including CT abdomen On my interpretation imaging demonstrates no acute process I independently visualized and interpreted imaging. I agree with the radiologist interpretation   Medicines ordered and prescription drug management: Meds ordered this encounter  Medications  sodium chloride  0.9 % bolus 1,000 mL   iohexol  (OMNIPAQUE ) 300 MG/ML solution 100 mL   cefdinir  (OMNICEF ) 300 MG capsule    Sig: Take 1 capsule (300 mg total) by mouth 2 (two) times daily for 7 days.    Dispense:  14 capsule    Refill:  0    -I have reviewed the patients home medicines and have made adjustments as needed  Cardiac Monitoring: The patient was maintained on a cardiac monitor.  I personally viewed and interpreted the cardiac monitored which showed an underlying rhythm of: NSR   Reevaluation: After the interventions noted above, I reevaluated the patient and found that their symptoms have improved  Co morbidities that complicate the patient evaluation  Past Medical History:  Diagnosis Date   Allergy AMOXICILLAN   Arthritis    Chest pain    Nuclear, adenosine ,  December, 2013, low risk nuclear scan with  small, moderate in intensity, fixed anteroseptal defect. This is possibly related to an LBBB versus small prior infarct. 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    Final Clinical Impression(s) / ED Diagnoses Final diagnoses:  Acute cystitis without hematuria  Dehydration  Feared complaint without diagnosis     This chart was dictated using voice recognition software.  Despite best efforts to proofread,  errors can occur which can change the documentation meaning.    Francesca Elsie CROME, MD 02/06/24 1929

## 2024-02-07 ENCOUNTER — Encounter: Payer: Self-pay | Admitting: Family

## 2024-02-09 LAB — URINE CULTURE: Culture: >=100000 — AB

## 2024-02-10 ENCOUNTER — Telehealth (HOSPITAL_BASED_OUTPATIENT_CLINIC_OR_DEPARTMENT_OTHER): Payer: Self-pay | Admitting: *Deleted

## 2024-02-10 NOTE — Telephone Encounter (Signed)
 Post ED Visit - Positive Culture Follow-up  Culture report reviewed by antimicrobial stewardship pharmacist: [facility] [x]  Koren Or, Pharm.D. []  Venetia Gully, Pharm.D., BCPS AQ-ID []  Garrel Crews, Pharm.D., BCPS []  Almarie Lunger, [address].D., BCPS []  [place], [address].D., BCPS, AAHIVP []  Rosaline Bihari, Pharm.D., BCPS, AAHIVP []  Vernell Meier, PharmD, BCPS []  Latanya Hint, PharmD, BCPS []  Donald Medley, PharmD, BCPS []  Rocky Bold, PharmD []  Dorothyann Alert, PharmD, BCPS []  Morene Babe, PharmD  [facility] []  Rosaline Edison, PharmD []  Romona Bliss, PharmD []  Dolphus Roller, PharmD []  Veva Seip, Rph []  Vernell Daunt) Leonce, PharmD []  Eva Allis, PharmD []  Rosaline Millet, PharmD []  Iantha Batch, PharmD []  Arvin Gauss, PharmD []  Wanda Hasting, PharmD []  Ronal Rav, PharmD []  Rocky Slade, PharmD []  Bard Jeans, PharmD   Positive urine culture Treated with cefdinir , organism sensitive to the same and no further patient follow-up is required at this time.  Margaret Norman 02/10/2024, 9:53 AM

## 2024-02-13 ENCOUNTER — Other Ambulatory Visit

## 2024-02-13 ENCOUNTER — Encounter: Payer: Self-pay | Admitting: Gastroenterology

## 2024-02-19 ENCOUNTER — Encounter: Payer: Self-pay | Admitting: Physician Assistant

## 2024-02-19 ENCOUNTER — Ambulatory Visit (INDEPENDENT_AMBULATORY_CARE_PROVIDER_SITE_OTHER): Admitting: Physician Assistant

## 2024-02-19 ENCOUNTER — Other Ambulatory Visit (INDEPENDENT_AMBULATORY_CARE_PROVIDER_SITE_OTHER): Payer: Self-pay

## 2024-02-19 DIAGNOSIS — M25552 Pain in left hip: Secondary | ICD-10-CM

## 2024-02-19 NOTE — Progress Notes (Signed)
 Post-Op Visit Note   Patient: Margaret Norman           Date of Birth: 04-28-1951           MRN: [id number] Visit Date: 02/19/2024 PCP: Jason Leita Repine, FNP   Assessment & Plan:  Chief Complaint:  Chief Complaint  Patient presents with   Left Hip - Follow-up    ORIF intertrochanteric femur fracture 11/27/2023   Visit Diagnoses:  1. Pain in left hip     Plan: Patient is a pleasant 73 year old female who comes in today approximately 12 weeks status post ORIF left intertrochanteric femur fracture with FNS implant, date of surgery 11/27/2023.  She has been doing much better.  She has finished therapy and is ambulating with a cane.  She is taking occasional Tylenol  and ibuprofen for soreness.  Overall, feeling much better.  Examination of left hip reveals moderate discomfort with hip flexion and logroll.  She is neurovascularly intact distally.  At this point, she will continue to advance with activity as tolerated.  She will follow-up with Dr. Jerri in 4 weeks for repeat evaluation and to discuss hardware removal and total hip replacement at some point in future.  Follow-Up Instructions: Return in about 4 weeks (around 03/18/2024).   Orders:  Orders Placed This Encounter  Procedures   XR HIP UNILAT W OR W/O PELVIS 2-3 VIEWS LEFT   No orders of the defined types were placed in this encounter.   Imaging: XR HIP UNILAT W OR W/O PELVIS 2-3 VIEWS LEFT Result Date: 02/19/2024 X-rays demonstrate stable fracture without hardware complication   PMFS History: Patient Active Problem List   Diagnosis Date Noted   Nondisplaced intertrochanteric fracture of left femur, initial encounter for closed fracture (HCC) 11/24/2023   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   Port-A-Cath in place 06/10/2019   Breast cancer (HCC) 04/15/2019   Second degree AV block    Malignant neoplasm of upper-outer quadrant of right breast in female, estrogen receptor positive (HCC) 03/25/2019   Thyromegaly  01/10/2018   Overweight (BMI 25.0-29.9) 08/13/2017   Upper respiratory tract infection 07/30/2017   Type 2 diabetes mellitus with hyperglycemia, with long-term current use of insulin  (HCC) 03/19/2017   Thiamine  deficiency 01/04/2017   Tinnitus aurium, 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   Deficiency anemia 04/30/2016   Routine general medical examination at a health care facility 04/27/2015   Abdominal wall hernia 04/26/2015   Family history of hemochromatosis 09/13/2014   Acute maxillary sinusitis 05/14/2014   History of colonic polyps 03/10/2014   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   Lumbosacral spondylosis without myelopathy 05/12/2013   Insomnia, persistent 02/04/2013   Obesity (BMI 30-39.9) 02/04/2013   Asthma with exacerbation 09/22/2012   Visit for screening mammogram 07/04/2012   Nephrolithiasis    HTN (hypertension)    Gallstones    LBBB (left bundle branch block)    Leukocytosis 12/20/2008   Hyperlipidemia with target LDL less than 100 09/04/2007   Chronic depression 09/04/2007   GERD 09/04/2007   Past Medical History:  Diagnosis Date   Allergy AMOXICILLAN   Arthritis    Chest pain    Nuclear, adenosine ,  December, 2013, low risk nuclear scan with small, moderate in intensity, fixed anteroseptal defect. This is possibly related to an LBBB versus small prior infarct. 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   Type II or unspecified type diabetes mellitus without mention of complication, not stated as uncontrolled     Family History  Problem Relation Age of Onset   Liver disease Mother    Dementia Mother    Diabetes Mother        borderline   Depression Mother    Coronary  artery disease Father    Heart attack Father    Hypertension Father    Heart disease Father    Early death Father    Cancer Other        leukemia   Stroke Maternal Grandfather    Alcohol abuse Maternal Grandfather    Hyperlipidemia Brother        Amyloidosis   Cancer Brother    Hypertension Brother    Diabetes Maternal Grandmother    Cancer Paternal Uncle        unknown   Heart attack Paternal Grandmother    Heart attack Paternal Uncle    Hypertension Brother    Diabetes Daughter        borderline   Diabetes Sister    Heart disease Sister    Kidney disease Sister    Heart disease Brother    Colon cancer Neg Hx    Esophageal cancer Neg Hx    Rectal cancer Neg Hx    Stomach cancer Neg Hx     Past Surgical History:  Procedure Laterality Date   APPENDECTOMY     BREAST BIOPSY Left 10/2017   BREAST BIOPSY Right 03/2019   BREAST BIOPSY Right 08/26/2023   US  RT BREAST BX W LOC DEV 1ST LESION IMG BX SPEC US  GUIDE 08/26/2023 [facility]   BREAST LUMPECTOMY Right 2020   BREAST LUMPECTOMY WITH RADIOACTIVE SEED AND SENTINEL LYMPH NODE BIOPSY Right 04/15/2019   Procedure: RIGHT BREAST PARTIAL MASTECTOMY WITH RADIOACTIVE SEED AND SENTINEL LYMPH NODE BIOPSY;  Surgeon: Vernetta Berg, MD;  Location: [facility];  Service: General;  Laterality: Right;   CESAREAN SECTION     x2 ? w/appy   CHOLECYSTECTOMY     ESOPHAGOGASTRODUODENOSCOPY     HERNIA REPAIR     INTRAMEDULLARY (IM) NAIL INTERTROCHANTERIC Left 11/27/2023   Procedure: LEFT OPEN REDUCTION INTERNAL FIXATION (ORIF) INTERTROCHANTOR;  Surgeon: Jerri Kay HERO, MD;  Location: [facility] OR;  Service: Orthopedics;  Laterality: Left;   KNEE ARTHROSCOPY Right 11/2005   LIVER BIOPSY     PANCREATIC CYST EXCISION     PORT-A-CATH REMOVAL Left 04/25/2020   Procedure: REMOVAL PORT-A-CATH;  Surgeon: Vernetta Berg, MD;  Location: [facility];  Service: General;  Laterality: Left;   PORTACATH PLACEMENT Left 04/15/2019   Procedure:  INSERTION PORT-A-CATH WITH ULTRASOUND;  Surgeon: Vernetta Berg, MD;  Location: [facility];  Service: General;  Laterality: Left;   RIGHT/LEFT HEART CATH AND CORONARY ANGIOGRAPHY N/A 09/28/2019   Procedure: RIGHT/LEFT HEART CATH AND CORONARY ANGIOGRAPHY;  Surgeon: Rolan Ezra RAMAN, MD;  Location: [facility];  Service: Cardiovascular;  Laterality: N/A;   SPLENECTOMY     TONSILLECTOMY AND ADENOIDECTOMY     TUBAL LIGATION  06/24/78   Social History   Occupational History   Occupation: CSR    Employer: [employer]   Occupation: retired  Tobacco Use   Smoking status: Never   Smokeless tobacco: Never  Vaping Use   Vaping status: Never Used  Substance and Sexual Activity   Alcohol use: No   Drug use: No   Sexual activity: Not Currently    Birth control/protection: Pill    Comment: Tubes tied

## 2024-02-25 ENCOUNTER — Encounter: Payer: Self-pay | Admitting: Internal Medicine

## 2024-02-25 ENCOUNTER — Ambulatory Visit: Admitting: Internal Medicine

## 2024-02-25 ENCOUNTER — Other Ambulatory Visit: Payer: Self-pay | Admitting: Internal Medicine

## 2024-02-25 VITALS — BP 112/78 | HR 85 | Ht 62.0 in | Wt 126.8 lb

## 2024-02-25 DIAGNOSIS — E118 Type 2 diabetes mellitus with unspecified complications: Secondary | ICD-10-CM | POA: Diagnosis not present

## 2024-02-25 DIAGNOSIS — Z794 Long term (current) use of insulin: Secondary | ICD-10-CM

## 2024-02-25 DIAGNOSIS — E042 Nontoxic multinodular goiter: Secondary | ICD-10-CM | POA: Diagnosis not present

## 2024-02-25 DIAGNOSIS — E785 Hyperlipidemia, unspecified: Secondary | ICD-10-CM | POA: Diagnosis not present

## 2024-02-25 LAB — POCT GLYCOSYLATED HEMOGLOBIN (HGB A1C): Hemoglobin A1C: 7.3 % — AB (ref 4.0–5.6)

## 2024-02-25 MED ORDER — REPAGLINIDE 1 MG PO TABS: 1.0000 mg | ORAL_TABLET | Freq: Every day | ORAL | 3 refills | Status: AC

## 2024-02-25 NOTE — Addendum Note (Signed)
 Addended by: CLEOTILDE ROLIN RAMAN on: 02/25/2024 09:39 AM   Modules accepted: Orders

## 2024-02-25 NOTE — Progress Notes (Signed)
 Patient ID: Margaret Norman, female   DOB: 09-17-1950, 73 y.o.   MRN: [id number]   HPI: [name] is a 73 y.o.-year-old female, returning for follow-up for DM2, dx in 2008, insulin -dependent since 2016, uncontrolled, without long term complications.  Last visit  4 months ago.  Interim history:  No increased urination, blurry vision, chest pain.  Some nausea and diarrhea. She  has dizziness and lightheadedness and a history of vertigo.  She is walking with a cane.   She had a left hip closed fracture 11/24/2023 - tripped over a blanket. She had to have ORIF. She will also need a hip replacement.  She is now walking with a cane.  She still has a little pain. She was in ED with acute cystitis 02/06/2024. She relaxed her diet since last visit and noticed higher blood sugars.  Reviewed HbA1c levels: Lab Results  Component Value Date   HGBA1C 6.5 (A) 10/23/2023   HGBA1C 6.9 (A) 06/24/2023   HGBA1C 6.4 (A) 02/12/2023   HGBA1C 6.4 (A) 09/26/2022   HGBA1C 6.5 (A) 05/23/2022   HGBA1C 6.2 (A) 01/17/2022   HGBA1C 6.7 (A) 09/15/2021   HGBA1C 7.1 (A) 05/17/2021   HGBA1C 7.0 (A) 01/10/2021   HGBA1C 6.9 (A) 09/20/2020   HGBA1C 7.0 (A) 05/18/2020   HGBA1C 7.4 (A) 01/14/2020   HGBA1C 6.6 (A) 09/17/2019   HGBA1C 8.6 (H) 04/10/2019   HGBA1C 7.4 (A) 04/01/2019   HGBA1C 7.1 (A) 12/17/2018   HGBA1C 6.8 (A) 01/10/2018   HGBA1C 8.7 08/13/2017   HGBA1C 7.5 (H) 05/02/2017   HGBA1C 10.5 (H) 12/31/2016  03/19/2017: HbA1c calculated from fructosamine is 6.7%! 12/31/2016: HbA1c calculated from fructosamine: 6.9%!  Pt is on: - Metformin  1000 mg 2x a day with meals >> 2000 mg with dinner - Jardiance  25 mg daily before breakfast (PAP) - Prandin   1 mg before larger dinners -started 01/2023-actually taking it after breakfast >> most before dinner - Trulicity  1.5 >> 3 mg weekly (PAP) - Lantus  30 >> 26 >> 22 >> 18 >> 12-14 >> taking 15 >> 12 units at night Stopped Actos  when started trulicity  back. She was  on Trulicity  but became expensive >> changed to Actos . She was on Invokana  >> recurrent UTIs.  Pt checks her sugars >4x a day:  Previously:  Previously:  Lowest sugar was 50s >> 69; she has hypoglycemia awareness in the 70s. Highest sugar was 280 >> 250 >> 300 (jellybeans).  Glucometer: OneTouch Verio  -No CKD, last BUN/creatinine:  Lab Results  Component Value Date   BUN 29 (H) 02/06/2024   BUN 31 (H) 02/06/2024   CREATININE 1.18 (H) 02/06/2024   CREATININE 1.35 (H) 02/06/2024   Lab Results  Component Value Date   MICRALBCREAT 29 10/23/2023   MICRALBCREAT 4.7 11/15/2008   MICRALBCREAT 10.7 07/12/2008   MICRALBCREAT 8.8 01/16/2007   MICRALBCREAT 8.8 07/17/2006  On Entresto .  -+ HL; last set of lipids: Lab Results  Component Value Date   CHOL 95 03/21/2023   HDL 45.00 03/21/2023   LDLCALC 28 03/21/2023   LDLDIRECT 137.9 02/04/2013   TRIG 110.0 03/21/2023   CHOLHDL 2 03/21/2023  On Crestor  10.  - last eye exam was 09/11/2023: No DR, + cataract OS.  Dr. Ginnie Pinal. Latest eye exam: [facility].  -+ numbness and tingling in her toes - after chemotx.  She has restless leg syndrome.  Last foot exam 10/23/2023.  Pt has FH of DM in MGM.  Multinodular goiter:   -Detected  on palpation in 2021  Thyroid  ultrasound (01/26/2020): Parenchymal Echotexture: Moderately heterogenous Isthmus: 0.3 cm Right lobe: 6.2 cm x 2.1 cm x 2.2 cm Left lobe: 7.7 cm x 3.2 cm x 2.8 cm _________________________________________________________   Nodule # 1: Location: Right; Superior Maximum size: 0.9 cm; Other 2 dimensions: 0.7 cm x 0.7 cm Composition: spongiform (0) Spongiform nodule does not meet criteria for surveillance or biopsy  _________________________________________________________   Nodule # 2: Location: Right; Mid Maximum size: 1.0 cm; Other 2 dimensions: 0.9 cm x 0.6 cm Composition: spongiform (0) Spongiform nodule does not meet criteria for surveillance or biopsy   _________________________________________________________   Nodule # 3:  Location: Right; Mid Maximum size: 1.2 cm; Other 2 dimensions: 1.1 cm x 0.7 cm Composition: spongiform (0) Spongiform nodule does not meet criteria for surveillance or biopsy _________________________________________________________   Nodule # 4:  Location: Right; Inferior Maximum size: 1.0 cm; Other 2 dimensions: 0.8 cm x 0.8 cm Composition: spongiform (0) Spongiform nodule does not meet criteria for surveillance or biopsy  ________________________________________________________   Nodule # 5:  Location: Right; Inferior Maximum size: 1.5 cm; Other 2 dimensions: 1.4 cm x 0.8 cm Composition: solid/almost completely solid (2) Echogenicity: isoechoic (1) Nodule meets criteria for surveillance  _________________________________________________________   Nodule # 6: Location: Left; Superior Maximum size: 1.2 cm; Other 2 dimensions: 1.1 cm x 0.9 cm  Composition: cannot determine (2) Echogenicity: isoechoic (1) Echogenic foci: macrocalcifications (1)  Nodule meets criteria for surveillance  _______________________________________________________   Nodule # 7: Location: Left; Inferior Maximum size: 4.7 cm; Other 2 dimensions: 4.7 cm x 2.8 cm Composition: mixed cystic and solid (1) Echogenicity: isoechoic (1) Echogenic foci: macrocalcifications (1) Nodule meets criteria for biopsy  ______________________________________________________   No adenopathy   IMPRESSION: Multinodular thyroid .   Left inferior thyroid  nodule (labeled 7, TR , 4.7 cm) meets criteria for biopsy, as designated by the newly established ACR TI-RADS criteria, and referral for biopsy is recommended.   Right inferior thyroid  nodule (labeled 5, 1.5 cm, TR 3) and the left superior thyroid  nodule (labeled 6, 1.2 cm, TR 4) both meet criteria for surveillance, as designated by the newly established ACR TI-RADS criteria. Surveillance  ultrasound study recommended to be performed annually up to 5 years.   FNA left inferior thyroid  nodule (02/16/2020): Benign  Thyroid  U/S (02/07/2021): Parenchymal Echotexture: Markedly heterogenous Isthmus: 0.6 cm Right lobe: 7.4 cm x 2.0 cm x 2.5 cm Left lobe: 8.4 cm x 3.2 cm x 4.3 cm _________________________________________________________   Estimated total number of nodules >/= 1 cm: 6-10  _________________________________________________________   Nodule labeled 1 superior right thyroid , slightly smaller than previous and spongiform. Nodule does not meet criteria for surveillance or biopsy   Nodule labeled 2, right thyroid , TR 2 with cystic components measures less than 1 cm. Nodule does not meet criteria for surveillance or biopsy   Nodule labeled 3, mid right thyroid . Spongiform characteristics measuring 1.3 cm. Nodule does not meet criteria for surveillance or biopsy.   Nodule labeled 4, mid right thyroid , 9 mm, spongiform characteristics. Nodule does not meet criteria for surveillance or biopsy.   Nodule labeled 5, 0.98 cm, spongiform characteristics and does not meet criteria for surveillance or biopsy.   Nodule 6, right lower thyroid , 0.96 cm. Nodule has spongiform characteristics and does not meet criteria for surveillance or biopsy.   Nodule 7, right lower thyroid , smaller than previous, now 1.0 cm. Previous nodule was labeled 5, 1.5 cm. Nodule has clearly spongiform characteristics on the current ultrasound,  and has decreased in size below 1 cm. Nodule no longer meets criteria for surveillance.   Nodule labeled 8 on the left, upper left thyroid , 9 mm. Nodule has internal cystic change on the current, TR 2 characteristics, and has decreased in size below 1 cm. Nodule no longer meets criteria for continued surveillance.   The nodules at the left inferior thyroid  labeled 9, 10, 11, appear to all be part of the previous 4.7 cm nodule which was  previously biopsied 02/16/2020, and are doubtful to represent independent nodules given the available images and the comparison.   No adenopathy   Recommendations follow those established by the new ACR TI-RADS criteria (J Am Coll Radiol 2017;14:587-595).   IMPRESSION: Enlarged multinodular thyroid , as above.   Nodule inferior left thyroid  has been previously biopsied, as above. Assuming benign result, no further specific follow-up would be indicated.  Pt denies: - feeling nodules in neck - hoarseness - dysphagia - just with pills (not new) - choking  Latest TSH was normal Lab Results  Component Value Date   TSH 0.80 03/21/2023   She sees Cardiology - Dr. Rolan >> on Entresto , Coreg . She also has a history of breast cancer, s/p chemoradiation therapy. She fractured humerus in 02/2020 after tripping >> this is healing.  She had a DXA scan >> normal.  She is on Arimidex .  She had lightheadedness and dizziness >> she was in the emergency room with vertigo 06/30/2021.  ROS: + See HPI  I reviewed pt's medications, allergies, PMH, social hx, family hx, and changes were documented in the history of present illness. Otherwise, unchanged from my initial visit note.  Past Medical History:  Diagnosis Date   Allergy AMOXICILLAN   Arthritis    Chest pain    Nuclear, adenosine ,  December, 2013, low risk nuclear scan with small, moderate in intensity, fixed anteroseptal defect. This is possibly related to an LBBB versus small prior infarct. 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   Type II or unspecified type diabetes mellitus without mention of complication, not stated as uncontrolled     Past Surgical History:  Procedure Laterality Date   APPENDECTOMY     BREAST BIOPSY Left 10/2017   BREAST BIOPSY Right 03/2019   BREAST BIOPSY Right 08/26/2023   US  RT BREAST BX W LOC DEV 1ST LESION IMG BX SPEC US  GUIDE 08/26/2023 [facility] MAMMOGR[facility]HY   BREAST LUMPECTOMY Right 2020   BREAST LUMPECTOMY WITH RADIOACTIVE SEED AND SENTINEL LYMPH NODE BIOPSY Right 04/15/2019   Procedure: RIGHT BREAST PARTIAL MASTECTOMY WITH RADIOACTIVE SEED AND SENTINEL LYMPH NODE BIOPSY;  Surgeon: Vernetta Berg, MD;  Location: [facility] OR;  Service: General;  Laterality: Right;   CESAREAN SECTION     x2 ? w/appy   CHOLECYSTECTOMY     ESOPHAGOGASTRODUODENOSCOPY     HERNIA REPAIR     INTRAMEDULLARY (IM) NAIL INTERTROCHANTERIC Left 11/27/2023   Procedure: LEFT OPEN REDUCTION INTERNAL FIXATION (ORIF) INTERTROCHANTOR;  Surgeon: Jerri Kay HERO, MD;  Location: [facility] OR;  Service: Orthopedics;  Laterality: Left;   KNEE ARTHROSCOPY Right 11/2005   LIVER BIOPSY     PANCREATIC CYST EXCISION  PORT-A-CATH REMOVAL Left 04/25/2020   Procedure: REMOVAL PORT-A-CATH;  Surgeon: Vernetta Berg, MD;  Location: [facility];  Service: General;  Laterality: Left;   PORTACATH PLACEMENT Left 04/15/2019   Procedure: INSERTION PORT-A-CATH WITH ULTRASOUND;  Surgeon: Vernetta Berg, MD;  [facility];  Service: General;  Laterality: Left;   RIGHT/LEFT HEART CATH AND CORONARY ANGIOGRAPHY N/A 09/28/2019   Procedure: RIGHT/LEFT HEART CATH AND CORONARY ANGIOGRAPHY;  Surgeon: Rolan Ezra RAMAN, MD;  Location: [facility] INVASIVE CV LAB;  Service: Cardiovascular;  Laterality: N/A;   SPLENECTOMY     TONSILLECTOMY AND ADENOIDECTOMY     TUBAL LIGATION  06/24/78   Social History   Socioeconomic History   Marital status: Married    Spouse name: Artemisa Sladek   Number of children: 2   Years of education: Not on file   Highest education level: 12th grade  Occupational History   Occupation: CSR    Employer: [employer]    Occupation: retired  Tobacco Use   Smoking status: Never   Smokeless tobacco: Never  Vaping Use   Vaping status: Never Used  Substance and Sexual Activity   Alcohol use: No   Drug use: No   Sexual activity: Not Currently    Birth control/protection: Pill    Comment: Tubes tied  Other Topics Concern   Not on file  Social History Narrative   Regular Exercise -  NO   Are you right handed or left handed? Right Handed    Are you currently employed ? No    What is your current occupation? Retired   Do you live at home alone? No   Who lives with you? Husband, Daughter, and Darden   What type of home do you live in: 1 story or 2 story? Lives in a one story home       Social Drivers of Health   Financial Resource Strain: Low Risk  (01/30/2024)   Overall Financial Resource Strain (CARDIA)    Difficulty of Paying Living Expenses: Not hard at all  Food Insecurity: No Food Insecurity (01/30/2024)   Hunger Vital Sign    Worried About Running Out of Food in the Last Year: Never true    Ran Out of Food in the Last Year: Never true  Transportation Needs: No Transportation Needs (01/30/2024)   PRAPARE - Administrator, Civil Service (Medical): No    Lack of Transportation (Non-Medical): No  Physical Activity: Inactive (01/30/2024)   Exercise Vital Sign    Days of Exercise per Week: 0 days    Minutes of Exercise per Session: Not on file  Stress: No Stress Concern Present (01/30/2024)   [employer] of Occupational Health - Occupational Stress Questionnaire    Feeling of Stress: Only a little  Social Connections: Moderately Isolated (01/30/2024)   Social Connection and Isolation Panel    Frequency of Communication with Friends and Family: Twice a week    Frequency of Social Gatherings with Friends and Family: Once a week    Attends Religious Services: Never    Database administrator or Organizations: No    Attends Engineer, structural: Not on file    Marital Status:  Married  Catering manager Violence: Not At Risk (12/19/2023)   Humiliation, Afraid, Rape, and Kick questionnaire    Fear of Current or Ex-Partner: No    Emotionally Abused: No    Physically Abused: No    Sexually Abused: No   Current Outpatient Medications  on File Prior to Visit  Medication Sig Dispense Refill   acetaminophen  (TYLENOL ) 500 MG tablet Take 1,000 mg by mouth every 6 (six) hours as needed for moderate pain.     anastrozole  (ARIMIDEX ) 1 MG tablet Take 1 tablet (1 mg total) by mouth daily. 90 tablet 3   aspirin 81 MG EC tablet Take 81 mg by mouth daily.     B Complex-C (SUPER B COMPLEX PO) Take 1 tablet by mouth daily.     buPROPion  (WELLBUTRIN  XL) 300 MG 24 hr tablet Take 1 tablet (300 mg total) by mouth every morning. 90 tablet 1   carvedilol  (COREG ) 12.5 MG tablet Take 1 tablet (12.5 mg total) by mouth 2 (two) times daily with a meal. 180 tablet 3   Cholecalciferol  (VITAMIN D ) 50 MCG (2000 UT) tablet Take 2,000 Units by mouth daily.     Continuous Blood Gluc Receiver (FREESTYLE LIBRE 2 READER) DEVI 1 each by Does not apply route daily. 6 each 3   Continuous Glucose Sensor (FREESTYLE LIBRE 2 SENSOR) MISC APPLY AS DIRECTED EVERY 14 DAYS 6 each 3   Dulaglutide  (TRULICITY ) 3 MG/0.5ML SOAJ Inject 3 mg into the skin once a week. (Patient taking differently: Inject 3 mg into the skin once a week. ON SATURDAYS) 6 mL 3   EASY COMFORT PEN NEEDLES 32G X 4 MM MISC USE AS DIRECTED EVERY DAY 100 each 3   ENTRESTO  24-26 MG TAKE 1 TABLET BY MOUTH TWICE DAILY 60 tablet 11   furosemide  (LASIX ) 40 MG tablet Take 1 tablet (40 mg total) by mouth as needed. For weight gain of 3ln in 24 hours or 5lb in a week     HYDROcodone -acetaminophen  (NORCO/VICODIN) 5-325 MG tablet Take 1 tablet by mouth every 6 (six) hours as needed for moderate pain (pain score 4-6). 30 tablet 0   JARDIANCE  25 MG TABS tablet TAKE 1 TABLET BY MOUTH DAILY 90 tablet 3   LANTUS  SOLOSTAR 100 UNIT/ML Solostar Pen INJECT  SUBCUTANEOUSLY 20 UNITS AT BEDTIME 30 mL 3   meclizine  (ANTIVERT ) 25 MG tablet Take 25 mg by mouth 3 (three) times daily as needed for dizziness.     metFORMIN  (GLUCOPHAGE ) 1000 MG tablet TAKE 1 TABLET BY MOUTH TWICE DAILY WITH A MEAL 180 tablet 3   pantoprazole  (PROTONIX ) 40 MG tablet Take 1 tablet (40 mg total) by mouth 2 (two) times daily. 180 tablet 0   Probiotic Product (PROBIOTIC ADVANCED PO) Take 1 tablet by mouth in the morning and at bedtime.     repaglinide  (PRANDIN ) 1 MG tablet Take 1 tablet (1 mg total) by mouth daily before supper.     rosuvastatin  (CRESTOR ) 10 MG tablet TAKE 1 TABLET BY MOUTH DAILY 30 tablet 11   sertraline  (ZOLOFT ) 100 MG tablet Take 1 tablet (100 mg total) by mouth daily. 90 tablet 3   spironolactone  (ALDACTONE ) 25 MG tablet TAKE 1 TABLET BY MOUTH DAILY 90 tablet 3   Vitamin E 180 MG CAPS Take 180 mg by mouth at bedtime.      zinc  gluconate 50 MG tablet Take 50 mg by mouth daily.     No current facility-administered medications on file prior to visit.   Allergies  Allergen Reactions   Invokana  [Canagliflozin ] Other (See Comments)    UTI's   Amoxicillin  Other (See Comments)    Sore tongue Did it involve swelling of the face/tongue/throat, SOB, or low BP? No Did it involve sudden or severe rash/hives, skin peeling, or any reaction  on the inside of your mouth or nose? No Did you need to seek medical attention at a hospital or doctor's office? No When did it last happen?      5-10 years ago If all above answers are NO, may proceed with cephalosporin use.    Family History  Problem Relation Age of Onset   Liver disease Mother    Dementia Mother    Diabetes Mother        borderline   Depression Mother    Coronary artery disease Father    Heart attack Father    Hypertension Father    Heart disease Father    Early death Father    Cancer Other        leukemia   Stroke Maternal Grandfather    Alcohol abuse Maternal Grandfather    Hyperlipidemia  Brother        Amyloidosis   Cancer Brother    Hypertension Brother    Diabetes Maternal Grandmother    Cancer Paternal Uncle        unknown   Heart attack Paternal Grandmother    Heart attack Paternal Uncle    Hypertension Brother    Diabetes Daughter        borderline   Diabetes Sister    Heart disease Sister    Kidney disease Sister    Heart disease Brother    Colon cancer Neg Hx    Esophageal cancer Neg Hx    Rectal cancer Neg Hx    Stomach cancer Neg Hx    PE: BP 112/78   Pulse 85   Ht 5' 2 (1.575 m)   Wt 126 lb 12.8 oz (57.5 kg)   SpO2 97%   BMI 23.19 kg/m   Wt Readings from Last 3 Encounters:  02/25/24 126 lb 12.8 oz (57.5 kg)  02/06/24 128 lb (58.1 kg)  02/06/24 128 lb 11.2 oz (58.4 kg)   Constitutional: Normal weight, in NAD, walks with a cane Eyes: EOMI, no exophthalmos ENT:  + full thyroid  on palpation (L>R lobe), no cervical lymphadenopathy Cardiovascular: RRR, No MRG Respiratory: CTA B Musculoskeletal: no deformities Skin: no rashes Neurological: No tremors with outstretched hands  ASSESSMENT: 1. DM2, insulin -dependent, uncontrolled, without long term complications, but with hyperglycemia  2. HL  3.  Multiple thyroid  nodules  PLAN:  1. Patient with longstanding, previously uncontrolled type 2 diabetes with improved control in the last 3.5 years.  At last visit, HbA1c was better, at 6.5%.  We reduced Lantus  dose to avoid further drops in blood sugars as she was taking a higher dose than recommended..  Sugars are significantly improved from before, mostly fluctuating within the target range with only occasional hyperglycemic spikes and also low blood sugars both during the day and night. CGM interpretation: -At today's visit, we reviewed her CGM downloads: It appears that 70% of values are in target range (goal >70%), while 30% are higher than 180 (goal <25%), and 0% are lower than 70 (goal <4%).  The calculated average blood sugar is 157.  The  projected HbA1c for the next 3 months (GMI) is approximately 7.1%. -Reviewing the CGM trends, sugars appear to be dropping abruptly overnight and then increasing slowly after lunch but with almost all of the blood sugars after dinner being above target.  Upon questioning, she relaxed her diet since last visit.  She is snacking after dinner, and she is also not taking the Prandin  before this meal.  We discussed about taking Prandin  before  dinner, to skip it if she forgot to take it before dinner, and also to move Lantus  from evening to morning to avoid an abrupt drop in blood sugar overnight. - she mentions diarrhea but does not feel that this is necessarily related to metformin , but rather to her probiotic.  She just ran out and I advised her to stay off for now.  If diarrhea continues, I advised her to try to split the metformin  dose in 2.  However, if the diarrhea persist, to let me know.  In that case, we may either switch to metformin  ER or hold metformin  for a period of time. - I suggested to:  Patient Instructions  Please continue: - Metformin  2000 mg with dinner (may need to split the dose into 1000 mg 2x a day) - Repaglinide  (Prandin ) 1 mg before dinner - Jardiance  25 mg daily before breakfast - Trulicity  3 mg weekly - Lantus  12 units but move this to am   Please return in 4 months.   - we checked her HbA1c: 7.3% (higher) - advised to check sugars at different times of the day - 4x a day, rotating check times - advised for yearly eye exams >> she is UTD - return to clinic in 3-4 months  2. HL - Reviewed latest lipid panel from 02/2023: Fractions at goal: Lab Results  Component Value Date   CHOL 95 03/21/2023   HDL 45.00 03/21/2023   LDLCALC 28 03/21/2023   LDLDIRECT 137.9 02/04/2013   TRIG 110.0 03/21/2023   CHOLHDL 2 03/21/2023  -She is on Crestor  10 mg daily without side effects  3.  Thyroid  nodules -Her left thyroid  lobe was enlarged on exam so we checked a thyroid   ultrasound in 01/2020.  She had several nodules, of which the left inferior nodule had an indication for biopsy.  A biopsy of this nodule was benign.  For the other 2 nodules a follow-up was indicated in 1 year.  He had another ultrasound in 01/2021 the nodule appeared to be either stable or decreased in size.  No imaging follow-up was indicated. - No neck compression symptoms or masses felt on palpation of her neck today - latest TSH was normal: Lab Results  Component Value Date   TSH 0.80 03/21/2023  -Will continue to follow her clinically for now  Lela Fendt, MD PhD [facility] Endocrinology

## 2024-02-25 NOTE — Patient Instructions (Addendum)
 Please continue: - Metformin  2000 mg with dinner (may need to split the dose into 1000 mg 2x a day) - Repaglinide  (Prandin ) 1 mg before dinner - Jardiance  25 mg daily before breakfast - Trulicity  3 mg weekly - Lantus  12 units but move this to am   Please return in 4 months.

## 2024-03-24 ENCOUNTER — Other Ambulatory Visit: Payer: Self-pay

## 2024-03-24 MED ORDER — SERTRALINE HCL 100 MG PO TABS: 100.0000 mg | ORAL_TABLET | Freq: Every day | ORAL | 3 refills | Status: AC

## 2024-03-26 ENCOUNTER — Ambulatory Visit

## 2024-03-26 ENCOUNTER — Other Ambulatory Visit: Payer: Self-pay

## 2024-03-26 ENCOUNTER — Ambulatory Visit (INDEPENDENT_AMBULATORY_CARE_PROVIDER_SITE_OTHER): Admitting: Physician Assistant

## 2024-03-26 DIAGNOSIS — M25552 Pain in left hip: Secondary | ICD-10-CM | POA: Diagnosis not present

## 2024-03-26 DIAGNOSIS — M1612 Unilateral primary osteoarthritis, left hip: Secondary | ICD-10-CM

## 2024-03-26 NOTE — Progress Notes (Signed)
 Post-Op Visit Note   Patient: Margaret Norman           Date of Birth: 05-May-1951           MRN: [id number] Visit Date: 03/26/2024 PCP: Jason Leita Repine, FNP (Inactive)   Assessment & Plan:  Chief Complaint:  Chief Complaint  Patient presents with   Left Hip - Pain    ORIF intertrochanteric femur fracture 11/27/2023     Visit Diagnoses:  1. Pain in left hip   2. Primary osteoarthritis of left hip     Plan: Patient is a pleasant 73 year old female who comes in today approximately 4 months status post FNS implant for left intertrochanteric femur fracture, date of surgery 11/27/2023.  She is doing significantly better.  She notes occasional discomfort alleviated with NSAIDs.  She is currently ambulating with a single-point cane.  She would like to proceed with total hip arthroplasty as soon as possible.  Examination of her left hip reveals minimal discomfort with hip flexion.  No pain with logroll.  She is neurovascularly intact distally.  At this point, I feel her fracture has healed and that she is likely experiencing some discomfort from her underlying OA.  We have discussed eventual hardware removal and total hip arthroplasty.  She will follow-up with Dr. Jerri to further discuss this.  Call with concerns or questions.  Follow-Up Instructions: Return for fu with dr. jerri to discuss hardware removal/tha.   Orders:  Orders Placed This Encounter  Procedures   XR HIP UNILAT W OR W/O PELVIS 2-3 VIEWS LEFT   No orders of the defined types were placed in this encounter.   Imaging: XR HIP UNILAT W OR W/O PELVIS 2-3 VIEWS LEFT Result Date: 03/26/2024 X-rays demonstrate fracture healing without hardware complication   PMFS History: Patient Active Problem List   Diagnosis Date Noted   Nondisplaced intertrochanteric fracture of left femur, initial encounter for closed fracture (HCC) 11/24/2023   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   Port-A-Cath in place 06/10/2019   Breast cancer  (HCC) 04/15/2019   Second degree AV block    Malignant neoplasm of upper-outer quadrant of right breast in female, estrogen receptor positive (HCC) 03/25/2019   Thyromegaly 01/10/2018   Overweight (BMI 25.0-29.9) 08/13/2017   Upper respiratory tract infection 07/30/2017   Type 2 diabetes mellitus with hyperglycemia, with long-term current use of insulin  (HCC) 03/19/2017   Thiamine  deficiency 01/04/2017   Tinnitus aurium, 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   Deficiency anemia 04/30/2016   Routine general medical examination at a health care facility 04/27/2015   Abdominal wall hernia 04/26/2015   Family history of hemochromatosis 09/13/2014   Acute maxillary sinusitis 05/14/2014   History of colonic polyps 03/10/2014   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   Lumbosacral spondylosis without myelopathy 05/12/2013   Insomnia, persistent 02/04/2013   Obesity (BMI 30-39.9) 02/04/2013   Asthma with exacerbation 09/22/2012   Visit for screening mammogram 07/04/2012   Nephrolithiasis    HTN (hypertension)    Gallstones    LBBB (left bundle branch block)    Leukocytosis 12/20/2008   Hyperlipidemia with target LDL less than 100 09/04/2007   Chronic depression 09/04/2007   GERD 09/04/2007   Past Medical History:  Diagnosis Date   Allergy AMOXICILLAN   Arthritis    Chest pain    Nuclear, adenosine ,  December, 2013, low risk nuclear scan with small, moderate in intensity, fixed anteroseptal defect. This is possibly related to an LBBB versus small prior infarct. 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   Type II or unspecified type diabetes mellitus without mention of complication, not stated as  uncontrolled     Family History  Problem Relation Age of Onset   Liver disease Mother    Dementia Mother    Diabetes Mother        borderline   Depression Mother    Coronary artery disease Father    Heart attack Father    Hypertension Father    Heart disease Father    Early death Father    Cancer Other        leukemia   Stroke Maternal Grandfather    Alcohol abuse Maternal Grandfather    Hyperlipidemia Brother        Amyloidosis   Cancer Brother    Hypertension Brother    Diabetes Maternal Grandmother    Cancer Paternal Uncle        unknown   Heart attack Paternal Grandmother    Heart attack Paternal Uncle    Hypertension Brother    Diabetes Daughter        borderline   Diabetes Sister    Heart disease Sister    Kidney disease Sister    Heart disease Brother    Colon cancer Neg Hx    Esophageal cancer Neg Hx    Rectal cancer Neg Hx    Stomach cancer Neg Hx     Past Surgical History:  Procedure Laterality Date   APPENDECTOMY     BREAST BIOPSY Left 10/2017   BREAST BIOPSY Right 03/2019   BREAST BIOPSY Right 08/26/2023   US  RT BREAST BX W LOC DEV 1ST LESION IMG BX SPEC US  GUIDE 08/26/2023 [facility] MAMMOGR[facility]HY   BREAST LUMPECTOMY Right 2020   BREAST LUMPECTOMY WITH RADIOACTIVE SEED AND SENTINEL LYMPH NODE BIOPSY Right 04/15/2019   Procedure: RIGHT BREAST PARTIAL MASTECTOMY WITH RADIOACTIVE SEED AND SENTINEL LYMPH NODE BIOPSY;  Surgeon: Vernetta Berg, MD;  Location: [facility] OR;  Service: General;  Laterality: Right;   CESAREAN SECTION     x2 ? w/appy   CHOLECYSTECTOMY     ESOPHAGOGASTRODUODENOSCOPY     HERNIA REPAIR     INTRAMEDULLARY (IM) NAIL INTERTROCHANTERIC Left 11/27/2023   Procedure: LEFT OPEN REDUCTION INTERNAL FIXATION (ORIF) INTERTROCHANTOR;  Surgeon: Jerri Kay HERO, MD;  Location: [facility] OR;  Service: Orthopedics;  Laterality: Left;   KNEE ARTHROSCOPY Right 11/2005   LIVER BIOPSY     PANCREATIC CYST EXCISION     PORT-A-CATH REMOVAL Left 04/25/2020   Procedure:  REMOVAL PORT-A-CATH;  Surgeon: Vernetta Berg, MD;  Location: [facility];  Service: General;  Laterality: Left;   PORTACATH PLACEMENT Left 04/15/2019   Procedure: INSERTION PORT-A-CATH WITH ULTRASOUND;  Surgeon: Vernetta Berg, MD;  Location: [facility] OR;  Service: General;  Laterality: Left;   RIGHT/LEFT HEART CATH AND CORONARY ANGIOGRAPHY N/A 09/28/2019   Procedure: RIGHT/LEFT HEART CATH AND CORONARY ANGIOGRAPHY;  Surgeon: Rolan Ezra RAMAN, MD;  Location: [facility];  Service: Cardiovascular;  Laterality: N/A;   SPLENECTOMY     TONSILLECTOMY AND ADENOIDECTOMY     TUBAL LIGATION  06/24/78   Social History  Occupational History   Occupation: CSR    Employer: [employer]   Occupation: retired  Tobacco Use   Smoking status: Never   Smokeless tobacco: Never  Vaping Use   Vaping status: Never Used  Substance and Sexual Activity   Alcohol use: No   Drug use: No   Sexual activity: Not Currently    Birth control/protection: Pill    Comment: Tubes tied

## 2024-03-31 ENCOUNTER — Encounter (HOSPITAL_COMMUNITY): Payer: Self-pay | Admitting: Cardiology

## 2024-04-01 ENCOUNTER — Ambulatory Visit (HOSPITAL_COMMUNITY): Payer: Self-pay | Admitting: Cardiology

## 2024-04-01 ENCOUNTER — Ambulatory Visit (HOSPITAL_COMMUNITY)
Admission: RE | Admit: 2024-04-01 | Discharge: 2024-04-01 | Disposition: A | Discharge status: Home / Self Care | Source: Ambulatory Visit | Attending: Cardiology | Admitting: Cardiology

## 2024-04-01 ENCOUNTER — Ambulatory Visit (HOSPITAL_BASED_OUTPATIENT_CLINIC_OR_DEPARTMENT_OTHER)
Admission: RE | Admit: 2024-04-01 | Discharge: 2024-04-01 | Disposition: A | Discharge status: Home / Self Care | Source: Ambulatory Visit | Attending: Cardiology | Admitting: Cardiology

## 2024-04-01 ENCOUNTER — Encounter (HOSPITAL_COMMUNITY): Payer: Self-pay | Admitting: Cardiology

## 2024-04-01 VITALS — BP 110/78 | HR 85 | Wt 124.8 lb

## 2024-04-01 DIAGNOSIS — I428 Other cardiomyopathies: Secondary | ICD-10-CM | POA: Insufficient documentation

## 2024-04-01 DIAGNOSIS — Z9221 Personal history of antineoplastic chemotherapy: Secondary | ICD-10-CM | POA: Diagnosis not present

## 2024-04-01 DIAGNOSIS — I5022 Chronic systolic (congestive) heart failure: Secondary | ICD-10-CM

## 2024-04-01 DIAGNOSIS — I429 Cardiomyopathy, unspecified: Secondary | ICD-10-CM | POA: Diagnosis not present

## 2024-04-01 DIAGNOSIS — I110 Hypertensive heart disease with heart failure: Secondary | ICD-10-CM | POA: Diagnosis not present

## 2024-04-01 DIAGNOSIS — M1610 Unilateral primary osteoarthritis, unspecified hip: Secondary | ICD-10-CM | POA: Diagnosis not present

## 2024-04-01 DIAGNOSIS — I440 Atrioventricular block, first degree: Secondary | ICD-10-CM | POA: Diagnosis not present

## 2024-04-01 DIAGNOSIS — C50919 Malignant neoplasm of unspecified site of unspecified female breast: Secondary | ICD-10-CM | POA: Insufficient documentation

## 2024-04-01 DIAGNOSIS — Z79899 Other long term (current) drug therapy: Secondary | ICD-10-CM | POA: Diagnosis not present

## 2024-04-01 DIAGNOSIS — I447 Left bundle-branch block, unspecified: Secondary | ICD-10-CM | POA: Diagnosis not present

## 2024-04-01 DIAGNOSIS — I340 Nonrheumatic mitral (valve) insufficiency: Secondary | ICD-10-CM | POA: Insufficient documentation

## 2024-04-01 DIAGNOSIS — I358 Other nonrheumatic aortic valve disorders: Secondary | ICD-10-CM | POA: Diagnosis not present

## 2024-04-01 DIAGNOSIS — Z8249 Family history of ischemic heart disease and other diseases of the circulatory system: Secondary | ICD-10-CM | POA: Diagnosis not present

## 2024-04-01 LAB — BASIC METABOLIC PANEL WITH GFR
Anion gap: 11 (ref 5–15)
BUN: 23 mg/dL (ref 8–23)
CO2: 23 mmol/L (ref 22–32)
Calcium: 9.6 mg/dL (ref 8.9–10.3)
Chloride: 106 mmol/L (ref 98–111)
Creatinine, Ser: 1.09 mg/dL — ABNORMAL HIGH (ref 0.44–1.00)
GFR, Estimated: 54 mL/min — ABNORMAL LOW (ref >60)
Glucose, Bld: 105 mg/dL — ABNORMAL HIGH (ref 70–99)
Potassium: 4.5 mmol/L (ref 3.5–5.1)
Sodium: 140 mmol/L (ref 135–145)

## 2024-04-01 LAB — LIPID PANEL
Cholesterol: 89 mg/dL (ref 0–200)
HDL: 40 mg/dL — ABNORMAL LOW (ref >40)
LDL Cholesterol: 33 mg/dL (ref 0–99)
Total CHOL/HDL Ratio: 2.2 ratio
Triglycerides: 78 mg/dL (ref <150)
VLDL: 16 mg/dL (ref 0–40)

## 2024-04-01 LAB — ECHOCARDIOGRAM COMPLETE
AR max vel: 1.92 cm2
AV Area VTI: 1.85 cm2
AV Area mean vel: 1.87 cm2
AV Mean grad: 4 mmHg
AV Peak grad: 6.8 mmHg
Ao pk vel: 1.3 m/s
Area-P 1/2: 4.29 cm2
Calc EF: 57.3 %
S' Lateral: 2.6 cm
Single Plane A2C EF: 61.7 %
Single Plane A4C EF: 54.6 %

## 2024-04-01 LAB — BRAIN NATRIURETIC PEPTIDE: B Natriuretic Peptide: 22.4 pg/mL (ref 0.0–100.0)

## 2024-04-01 MED ORDER — CARVEDILOL 6.25 MG PO TABS: 6.2500 mg | ORAL_TABLET | Freq: Two times a day (BID) | ORAL | 3 refills | Status: AC

## 2024-04-01 NOTE — Patient Instructions (Signed)
 CHANGE Carvedilol  to 6.25 mg Twice daily  Labs done today, your results will be available in MyChart, we will contact you for abnormal readings.  Your physician recommends that you schedule a follow-up appointment in: 6 months.  If you have any questions or concerns before your next appointment please send us  a message through [place] or call our office at [phone].    TO LEAVE A MESSAGE FOR THE NURSE SELECT OPTION 2, PLEASE LEAVE A MESSAGE INCLUDING: YOUR NAME DATE OF BIRTH CALL BACK NUMBER REASON FOR CALL**this is important as we prioritize the call backs  YOU WILL RECEIVE A CALL BACK THE SAME DAY AS LONG AS YOU CALL BEFORE 4:00 PM  At the [facility], you and your health needs are our priority. As part of our continuing mission to provide you with exceptional heart care, we have created designated Provider Care Teams. These Care Teams include your primary Cardiologist (physician) and Advanced Practice Providers (APPs- Physician Assistants and Nurse Practitioners) who all work together to provide you with the care you need, when you need it.   You may see any of the following providers on your designated Care Team at your next follow up: Dr Toribio Fuel Dr Ezra Shuck Dr. Ria Commander Dr. Morene Brownie Amy Lenetta, NP Caffie Shed, [place] [facility] [place], [place] Beckey Coe, NP [place] Lee, NP Ellouise Class, NP Tinnie Redman, PharmD Jaun Bash, PharmD   Please be sure to bring in all your medications bottles to every appointment.    Thank you for choosing [facility]

## 2024-04-02 NOTE — Progress Notes (Signed)
 PCP: Margaret Leita Repine, FNP (Inactive) Oncology: Dr. Odean HF cardiology: Dr. Rolan   Chief complaint: CHF  73 y.o. with history of DM, chronic LBBB, and breast cancer was referred by Dr. Odean for workup of fall in EF.  She was diagnosed with right breast cancer, ER+/PR+/HER2+ in 9/20.  She had right lumpectomy in 10/20.  Starting in 11/20, she had been getting 12 cycles of Taxol /Herceptin  to be followed by Herceptin  alone to complete a year. Echo in 10/20 showed EF 55-60% per report (looked more like 50% on my review, likely due to septal-lateral dyssynchrony).  Her repeat echo for Herceptin  screening was done in 1/21, showing EF down to 30-35% with septal-lateral dyssynchrony.  Herceptin  has been stopped for now.  She is currently undergoing radiation therapy.   Echo in 3/21 showed that EF remains 30-35% with prominent septal-lateral dyssynchrony.  Patient has a strong family history of heart problems.  Father died from ruptured aorta.  Sister had heart valve repair, one brother has a pacemaker, and another brother has AL amyloidosis.   LHC/RHC was done in 4/21. This showed normal filling pressures, preserved cardiac output, and no significant CAD. Cardiac MRI in 5/21 showed EF 36% with septal-lateral dyssynchrony, RV EF normal 45%, no LGE.  Echo in 10/21 showed EF 40-45% with septal-lateral dyssynchrony.   Echo in 4/22 showed EF 45% with septal-lateral dyssynchrony, normal RV.  Echo in 4/23 showed EF 45% with septal-lateral dyssynchrony, normal RV, IVC normal.   Echo in 5/24 showed EF 45-50%, septal-lateral dyssynchrony, RV normal, IVC normal.    Echo was done today and reviewed, EF 55% with septal-lateral dyssynchrony, mild MR, IVC normal.   Today she returns for HF follow up. She has been having orthostatic symptoms, gets lightheaded with standing.  Weight is down 7 lbs.  She tripped and fell, fracturing her femur on the left.  This was repaired but she is going to need surgical  THR.  Currently using a cane.  No exertional dyspnea or chest pain.  She has hip pain with walking.   Labs (9/24): LDL 28, TSH normal Labs (11/24): K 3.9, creatinine 1.07, hgb 12.2 Labs (9/25): K 4.6, creatinine 1.18  ECG (personally reviewed): NSR, 1st degree AVB, LBBB  PMH: 1. Depression 2. Type 2 diabetes 3. Chronic LBBB 4. H/o transient complete heart block: in 10/20 when she was having port-a-cath placed.  5. HTN 6. Hyperlipidemia.  7. Breast cancer: She was diagnosed with right breast cancer, ER+/PR+/HER2+ in 9/20.  She had right lumpectomy in 10/20.  Starting in 11/20, she has been getting 12 cycles of Taxol /Herceptin  to be followed by Herceptin  alone to complete a year.  8. Chronic systolic CHF: She has had mildly decreased EF in the past, possibly related to LBBB.  Nonischemic cardiomyopathy.  - Echo (4/17): EF 50-55% - Cardiolite  (4/17): EF 48% - Echo (10/20): EF 55-60% (reviewed, think more like 50% but technically difficult).  - Echo (1/21): EF 30-35%, mild LVH, septal dyssynchrony.  - Echo (3/21): EF 30-35% with prominent septal-lateral dyssynchrony.  - LHC/RHC (4/21): mean RA 1, PA 13/3, mean PCWP 2, CI 3.74, no significant CAD.  - Cardiac MRI (5/21): EF 36% with septal-lateral dyssynchrony, RV EF normal 45%, no LGE.  - Echo (10/21): EF 40-45%, septal-lateral dyssynchrony.  - Echo (4/22): EF 45% with septal-lateral dyssynchrony, normal RV. - Echo (4/23): EF 45% with septal-lateral dyssynchrony, normal RV, IVC normal.  - Echo (5/24): EF 45-50%, septal-lateral dyssynchrony, RV normal, IVC normal.  -  Echo (10/25): EF 55% with septal-lateral dyssynchrony, mild MR, IVC normal.   Social History   Socioeconomic History   Marital status: Married    Spouse name: Margaret Norman   Number of children: 2   Years of education: Not on file   Highest education level: 12th grade  Occupational History   Occupation: CSR    Employer: [employer]   Occupation: retired   Tobacco Use   Smoking status: Never   Smokeless tobacco: Never  Vaping Use   Vaping status: Never Used  Substance and Sexual Activity   Alcohol use: No   Drug use: No   Sexual activity: Not Currently    Birth control/protection: Pill    Comment: Tubes tied  Other Topics Concern   Not on file  Social History Narrative   Regular Exercise -  NO   Are you right handed or left handed? Right Handed    Are you currently employed ? No    What is your current occupation? Retired   Do you live at home alone? No   Who lives with you? Husband, Daughter, and Darden   What type of home do you live in: 1 story or 2 story? Lives in a one story home       Social Drivers of Health   Financial Resource Strain: Low Risk  (01/30/2024)   Overall Financial Resource Strain (CARDIA)    Difficulty of Paying Living Expenses: Not hard at all  Food Insecurity: No Food Insecurity (01/30/2024)   Hunger Vital Sign    Worried About Running Out of Food in the Last Year: Never true    Ran Out of Food in the Last Year: Never true  Transportation Needs: No Transportation Needs (01/30/2024)   PRAPARE - Administrator, Civil Service (Medical): No    Lack of Transportation (Non-Medical): No  Physical Activity: Inactive (01/30/2024)   Exercise Vital Sign    Days of Exercise per Week: 0 days    Minutes of Exercise per Session: Not on file  Stress: No Stress Concern Present (01/30/2024)   [employer] of Occupational Health - Occupational Stress Questionnaire    Feeling of Stress: Only a little  Social Connections: Moderately Isolated (01/30/2024)   Social Connection and Isolation Panel    Frequency of Communication with Friends and Family: Twice a week    Frequency of Social Gatherings with Friends and Family: Once a week    Attends Religious Services: Never    Database administrator or Organizations: No    Attends Engineer, structural: Not on file    Marital Status: Married  Careers information officer Violence: Not At Risk (12/19/2023)   Humiliation, Afraid, Rape, and Kick questionnaire    Fear of Current or Ex-Partner: No    Emotionally Abused: No    Physically Abused: No    Sexually Abused: No   Family History  Problem Relation Age of Onset   Liver disease Mother    Dementia Mother    Diabetes Mother        borderline   Depression Mother    Coronary artery disease Father    Heart attack Father    Hypertension Father    Heart disease Father    Early death Father    Cancer Other        leukemia   Stroke Maternal Grandfather    Alcohol abuse Maternal Grandfather    Hyperlipidemia Brother  Amyloidosis   Cancer Brother    Hypertension Brother    Diabetes Maternal Grandmother    Cancer Paternal Uncle        unknown   Heart attack Paternal Grandmother    Heart attack Paternal Uncle    Hypertension Brother    Diabetes Daughter        borderline   Diabetes Sister    Heart disease Sister    Kidney disease Sister    Heart disease Brother    Colon cancer Neg Hx    Esophageal cancer Neg Hx    Rectal cancer Neg Hx    Stomach cancer Neg Hx    Current Outpatient Medications  Medication Sig Dispense Refill   acetaminophen  (TYLENOL ) 500 MG tablet Take 1,000 mg by mouth every 6 (six) hours as needed for moderate pain.     anastrozole  (ARIMIDEX ) 1 MG tablet Take 1 tablet (1 mg total) by mouth daily. 90 tablet 3   aspirin EC 81 MG tablet Take 162 mg by mouth daily. Swallow whole.     B Complex-C (SUPER B COMPLEX PO) Take 1 tablet by mouth daily.     buPROPion  (WELLBUTRIN  XL) 300 MG 24 hr tablet Take 1 tablet (300 mg total) by mouth every morning. 90 tablet 1   Cholecalciferol  (VITAMIN D ) 50 MCG (2000 UT) tablet Take 2,000 Units by mouth daily.     Continuous Blood Gluc Receiver (FREESTYLE LIBRE 2 READER) DEVI 1 each by Does not apply route daily. 6 each 3   Continuous Glucose Sensor (FREESTYLE LIBRE 2 SENSOR) MISC APPLY AS DIRECTED EVERY 14 DAYS 6 each 3    Dulaglutide  (TRULICITY ) 3 MG/0.5ML SOAJ Inject 3 mg into the skin once a week. 6 mL 3   EASY COMFORT PEN NEEDLES 32G X 4 MM MISC USE AS DIRECTED EVERY DAY 100 each 3   ENTRESTO  24-26 MG TAKE 1 TABLET BY MOUTH TWICE DAILY 60 tablet 11   furosemide  (LASIX ) 40 MG tablet Take 1 tablet (40 mg total) by mouth as needed. For weight gain of 3ln in 24 hours or 5lb in a week     insulin  glargine (LANTUS ) 100 UNIT/ML injection Inject 12 Units into the skin daily after breakfast.     JARDIANCE  25 MG TABS tablet TAKE 1 TABLET BY MOUTH DAILY 90 tablet 3   meclizine  (ANTIVERT ) 25 MG tablet Take 25 mg by mouth 3 (three) times daily as needed for dizziness.     metFORMIN  (GLUCOPHAGE ) 1000 MG tablet TAKE 1 TABLET BY MOUTH TWICE DAILY WITH A MEAL 180 tablet 3   Naproxen Sodium (ALEVE PO) Take 1 tablet by mouth as needed.     pantoprazole  (PROTONIX ) 40 MG tablet Take 1 tablet (40 mg total) by mouth 2 (two) times daily. 180 tablet 0   Probiotic Product (PROBIOTIC ADVANCED PO) Take 1 tablet by mouth in the morning and at bedtime.     repaglinide  (PRANDIN ) 1 MG tablet Take 1 tablet (1 mg total) by mouth daily before supper. 90 tablet 3   rosuvastatin  (CRESTOR ) 10 MG tablet TAKE 1 TABLET BY MOUTH DAILY 30 tablet 11   sertraline  (ZOLOFT ) 100 MG tablet Take 1 tablet (100 mg total) by mouth daily. 90 tablet 3   spironolactone  (ALDACTONE ) 25 MG tablet TAKE 1 TABLET BY MOUTH DAILY 90 tablet 3   Vitamin E 180 MG CAPS Take 180 mg by mouth at bedtime.      zinc  gluconate 50 MG tablet Take 50 mg by mouth  daily.     carvedilol  (COREG ) 6.25 MG tablet Take 1 tablet (6.25 mg total) by mouth 2 (two) times daily with a meal. 180 tablet 3   No current facility-administered medications for this encounter.   Wt Readings from Last 3 Encounters:  04/01/24 56.6 kg (124 lb 12.8 oz)  02/25/24 57.5 kg (126 lb 12.8 oz)  02/06/24 58.1 kg (128 lb)   No data found.  Wt Readings from Last 3 Encounters:  04/01/24 56.6 kg (124 lb 12.8 oz)   02/25/24 57.5 kg (126 lb 12.8 oz)  02/06/24 58.1 kg (128 lb)   BP 110/78   Pulse 85   Wt 56.6 kg (124 lb 12.8 oz)   SpO2 98%   BMI 22.83 kg/m   General: NAD Neck: No JVD, no thyromegaly or thyroid  nodule.  Lungs: Clear to auscultation bilaterally with normal respiratory effort. CV: Nondisplaced PMI.  Heart regular S1/S2, no S3/S4, no murmur.  No peripheral edema.  No carotid bruit.  Normal pedal pulses.  Abdomen: Soft, nontender, no hepatosplenomegaly, no distention.  Skin: Intact without lesions or rashes.  Neurologic: Alert and oriented x 3.  Psych: Normal affect. Extremities: No clubbing or cyanosis.  HEENT: Normal.   Assessment/Plan: 1. Chronic systolic CHF:  Nonischemic cardiomyopathy.  Echo in 1/21 showed EF down to 30-35% with septal-lateral dyssynchrony.  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  Based on the timeline, the most likely cause for fall in EF seems to be Herceptin  use.  She is now off Herceptin , but echo in 3/21 showed EF still in the 30-35% range.  LHC/RHC in 4/21 showed low filling pressures, preserved cardiac output, and no significant CAD.  Cardiac MRI in 5/21 showed EF 36%, no LGE (no evidence for infiltrative disease). Of note, brother died of sudden death so also consider familial cardiomyopathy.  Echo in 10/21 showed EF up a bit to 40-45% with dyssynchrony due to LBBB.  Echo in 4/22 showed EF 45%, septal-lateral dyssynchrony. Echo in 4/23 showed EF 45% with septal-lateral dyssynchrony.  Echo in 5/24 showed EF 45-50%, septal-lateral dyssynchrony, RV normal, IVC normal.  Echo today showed EF 55%, septal-lateral dyssynchrony from LBBB.  NYHA class I-II, limited mainly by orthostatic symptoms and hip pain. She is not volume overloaded on exam. - She does not need regular Lasix .  - Continue Entresto  at 24/26 bid. BMET today. - With orthostatic  symptoms and fall, decrease Coreg  to 6.25 mg bid to promote higher BP.  - Continue spironolactone  25 mg daily  - Continue Jardiance .  2. 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  - Follow closely with use of Coreg .  ECG as above. 3. LBBB: Chronic.  ?LBBB cardiomyopathy, though QRS today was not markedly prolonged today.   4. Breast cancer: She has completed therapy.  5. Hip OA: She needs left THR.  At this point, with EF back to normal range and minimal cardiac symptoms, I think she is at low risk for operative complications.   Follow up in 6 months with APP  I spent 32 minutes reviewing records, interviewing/examining patient, and managing orders.    Margaret Norman 04/02/2024

## 2024-04-06 ENCOUNTER — Ambulatory Visit (HOSPITAL_COMMUNITY): Payer: Self-pay | Admitting: Family Medicine

## 2024-04-07 ENCOUNTER — Ambulatory Visit: Admitting: Pediatrics

## 2024-04-08 ENCOUNTER — Encounter: Payer: Self-pay | Admitting: Gastroenterology

## 2024-04-08 ENCOUNTER — Ambulatory Visit: Payer: Self-pay | Admitting: Gastroenterology

## 2024-04-08 ENCOUNTER — Other Ambulatory Visit: Payer: Self-pay

## 2024-04-08 ENCOUNTER — Other Ambulatory Visit

## 2024-04-08 ENCOUNTER — Ambulatory Visit (INDEPENDENT_AMBULATORY_CARE_PROVIDER_SITE_OTHER): Admitting: Gastroenterology

## 2024-04-08 ENCOUNTER — Encounter (HOSPITAL_COMMUNITY): Payer: Self-pay | Admitting: Emergency Medicine

## 2024-04-08 ENCOUNTER — Emergency Department (HOSPITAL_COMMUNITY)
Admission: EM | Admit: 2024-04-08 | Discharge: 2024-04-08 | Disposition: A | Discharge status: Home / Self Care | Attending: Emergency Medicine | Admitting: Emergency Medicine

## 2024-04-08 ENCOUNTER — Other Ambulatory Visit: Payer: Self-pay | Admitting: Emergency Medicine

## 2024-04-08 VITALS — BP 108/64 | HR 88 | Ht 62.0 in | Wt 125.6 lb

## 2024-04-08 DIAGNOSIS — Z860100 Personal history of colon polyps, unspecified: Secondary | ICD-10-CM

## 2024-04-08 DIAGNOSIS — D649 Anemia, unspecified: Secondary | ICD-10-CM | POA: Diagnosis not present

## 2024-04-08 DIAGNOSIS — Z7982 Long term (current) use of aspirin: Secondary | ICD-10-CM | POA: Insufficient documentation

## 2024-04-08 DIAGNOSIS — I110 Hypertensive heart disease with heart failure: Secondary | ICD-10-CM | POA: Insufficient documentation

## 2024-04-08 DIAGNOSIS — Z794 Long term (current) use of insulin: Secondary | ICD-10-CM | POA: Diagnosis not present

## 2024-04-08 DIAGNOSIS — R142 Eructation: Secondary | ICD-10-CM

## 2024-04-08 DIAGNOSIS — Z853 Personal history of malignant neoplasm of breast: Secondary | ICD-10-CM | POA: Diagnosis not present

## 2024-04-08 DIAGNOSIS — Z79899 Other long term (current) drug therapy: Secondary | ICD-10-CM | POA: Insufficient documentation

## 2024-04-08 DIAGNOSIS — R195 Other fecal abnormalities: Secondary | ICD-10-CM

## 2024-04-08 DIAGNOSIS — R110 Nausea: Secondary | ICD-10-CM

## 2024-04-08 DIAGNOSIS — R1084 Generalized abdominal pain: Secondary | ICD-10-CM

## 2024-04-08 DIAGNOSIS — R7989 Other specified abnormal findings of blood chemistry: Secondary | ICD-10-CM | POA: Diagnosis not present

## 2024-04-08 DIAGNOSIS — I509 Heart failure, unspecified: Secondary | ICD-10-CM | POA: Insufficient documentation

## 2024-04-08 DIAGNOSIS — R634 Abnormal weight loss: Secondary | ICD-10-CM

## 2024-04-08 DIAGNOSIS — R194 Change in bowel habit: Secondary | ICD-10-CM

## 2024-04-08 DIAGNOSIS — E119 Type 2 diabetes mellitus without complications: Secondary | ICD-10-CM | POA: Insufficient documentation

## 2024-04-08 LAB — CBC WITH DIFFERENTIAL/PLATELET
Abs Immature Granulocytes: 0.04 K/uL (ref 0.00–0.07)
Basophils Absolute: 0.1 K/uL (ref 0.0–0.1)
Basophils Absolute: 0.1 K/uL (ref 0.0–0.1)
Basophils Relative: 0.5 % (ref 0.0–3.0)
Basophils Relative: 1 %
Eosinophils Absolute: 0.5 K/uL (ref 0.0–0.7)
Eosinophils Absolute: 0.7 K/uL — ABNORMAL HIGH (ref 0.0–0.5)
Eosinophils Relative: 3.8 % (ref 0.0–5.0)
Eosinophils Relative: 5 %
HCT: 33.7 % — ABNORMAL LOW (ref 36.0–46.0)
HCT: 34.9 % — ABNORMAL LOW (ref 36.0–46.0)
Hemoglobin: 10.7 g/dL — ABNORMAL LOW (ref 12.0–15.0)
Hemoglobin: 10.5 g/dL — ABNORMAL LOW (ref 12.0–15.0)
Immature Granulocytes: 0 %
Lymphocytes Relative: 28.8 % (ref 12.0–46.0)
Lymphocytes Relative: 37 %
Lymphs Abs: 3.5 K/uL (ref 0.7–4.0)
Lymphs Abs: 4.8 K/uL — ABNORMAL HIGH (ref 0.7–4.0)
MCH: 27.7 pg (ref 26.0–34.0)
MCHC: 31.9 g/dL (ref 30.0–36.0)
MCHC: 30.1 g/dL (ref 30.0–36.0)
MCV: 88 fl (ref 78.0–100.0)
MCV: 92.1 fL (ref 80.0–100.0)
Monocytes Absolute: 1.1 K/uL — ABNORMAL HIGH (ref 0.1–1.0)
Monocytes Absolute: 1.5 K/uL — ABNORMAL HIGH (ref 0.1–1.0)
Monocytes Relative: 8.8 % (ref 3.0–12.0)
Monocytes Relative: 12 %
Neutro Abs: 7 K/uL (ref 1.4–7.7)
Neutro Abs: 5.8 K/uL (ref 1.7–7.7)
Neutrophils Relative %: 58.1 % (ref 43.0–77.0)
Neutrophils Relative %: 45 %
Platelets: 486 K/uL — ABNORMAL HIGH (ref 150.0–400.0)
Platelets: 476 K/uL — ABNORMAL HIGH (ref 150–400)
RBC: 3.83 Mil/uL — ABNORMAL LOW (ref 3.87–5.11)
RBC: 3.79 MIL/uL — ABNORMAL LOW (ref 3.87–5.11)
RDW: 14.4 % (ref 11.5–15.5)
RDW: 14.7 % (ref 11.5–15.5)
WBC: 12.1 K/uL — ABNORMAL HIGH (ref 4.0–10.5)
WBC: 12.8 K/uL — ABNORMAL HIGH (ref 4.0–10.5)
nRBC: 0 % (ref 0.0–0.2)

## 2024-04-08 LAB — COMPREHENSIVE METABOLIC PANEL WITH GFR
ALT: 8 U/L (ref 0–35)
ALT: 13 U/L (ref 0–44)
AST: 11 U/L (ref 0–37)
AST: 13 U/L — ABNORMAL LOW (ref 15–41)
Albumin: 4.2 g/dL (ref 3.5–5.2)
Albumin: 4.1 g/dL (ref 3.5–5.0)
Alkaline Phosphatase: 69 U/L (ref 39–117)
Alkaline Phosphatase: 82 U/L (ref 38–126)
Anion gap: 11 (ref 5–15)
BUN: 33 mg/dL — ABNORMAL HIGH (ref 6–23)
BUN: 32 mg/dL — ABNORMAL HIGH (ref 8–23)
CO2: 25 meq/L (ref 19–32)
CO2: 26 mmol/L (ref 22–32)
Calcium: 9.6 mg/dL (ref 8.4–10.5)
Calcium: 9.1 mg/dL (ref 8.9–10.3)
Chloride: 107 meq/L (ref 96–112)
Chloride: 108 mmol/L (ref 98–111)
Creatinine, Ser: 1.28 mg/dL — ABNORMAL HIGH (ref 0.40–1.20)
Creatinine, Ser: 1.13 mg/dL — ABNORMAL HIGH (ref 0.44–1.00)
GFR, Estimated: 51 mL/min — ABNORMAL LOW (ref >60)
GFR: 41.45 mL/min — ABNORMAL LOW (ref >60.00)
Glucose, Bld: 141 mg/dL — ABNORMAL HIGH (ref 70–99)
Glucose, Bld: 169 mg/dL — ABNORMAL HIGH (ref 70–99)
Potassium: 4.5 meq/L (ref 3.5–5.1)
Potassium: 4.2 mmol/L (ref 3.5–5.1)
Sodium: 145 meq/L (ref 135–145)
Sodium: 145 mmol/L (ref 135–145)
Total Bilirubin: 0.3 mg/dL (ref 0.2–1.2)
Total Bilirubin: 0.2 mg/dL (ref 0.0–1.2)
Total Protein: 6.6 g/dL (ref 6.0–8.3)
Total Protein: 6.8 g/dL (ref 6.5–8.1)

## 2024-04-08 LAB — IBC + FERRITIN
Ferritin: 19.8 ng/mL (ref 10.0–291.0)
Iron: 75 ug/dL (ref 42–145)
Saturation Ratios: 19.3 % — ABNORMAL LOW (ref 20.0–50.0)
TIBC: 387.8 ug/dL (ref 250.0–450.0)
Transferrin: 277 mg/dL (ref 212.0–360.0)

## 2024-04-08 LAB — TSH: TSH: 0.37 u[IU]/mL (ref 0.35–5.50)

## 2024-04-08 LAB — FOLATE: Folate: 14.6 ng/mL (ref >5.9)

## 2024-04-08 LAB — MAGNESIUM: Magnesium: 1.8 mg/dL (ref 1.7–2.4)

## 2024-04-08 LAB — VITAMIN B12: Vitamin B-12: 298 pg/mL (ref 211–911)

## 2024-04-08 MED ORDER — LACTATED RINGERS IV BOLUS
500.0000 mL | Freq: Once | INTRAVENOUS | Status: AC
  Administered 2024-04-08: 500 mL via INTRAVENOUS

## 2024-04-08 NOTE — Progress Notes (Signed)
 Margaret Norman [id number] Jul 13, 1950   Chief Complaint: Diarrhea, weight loss  Referring Provider: Jason Leita Repine,* Primary GI MD: Dr. Legrand  HPI: Margaret Norman is a 73 y.o. female with past medical history of arthritis, T2DM, CHF, depression, GERD, kidney stones, breast cancer, HTN, HLD cholecystectomy, appendectomy, splenectomy who presents today for a complaint of diarrhea and weight loss.    Last seen in office 11/09/2020 by Dr. Legrand for complaint of upper abdominal pain and bloating.  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  GLP-1 agonist may be contributing to symptoms as well.  Seen by PCP 02/06/2024 for concern of change in bowel habits and increased nausea.  Symptoms intermittently over the previous year.  Decreased appetite, sense of pressure in the abdomen, increased belching which does respond to Tums.  Patient was concerned about pancreatic cancer.  CT A/P was ordered, switched from omeprazole  to pantoprazole , and referred to GI for further evaluation.  Follows with cardiology for CHF, chronic LBBB, last visit 04/01/2024. Sounds like she has plans for left total hip replacement.  Deemed low risk for operative complications.  Seen to have normocytic anemia over the course of this year.  On Trulicity , Protonix .  CT A/P 02/06/2024 showed no acute process.  New pneumobilia in the left lobe of the liver, patient noted to be s/p cholecystectomy and findings may be related to prior sphincterotomy.  New mildly hyperdense low-attenuation area in the left kidney measuring 3 cm which may reflect a cyst and further recommendation was to evaluate with ultrasound.   Patient states that after being treated for breast cancer she developed constipation which progressed to intermittent diarrhea.  Was treated for breast cancer in 2021.  States that she has 1 bowel movement every morning and for the last few months stools are always loose.  Denies any blood in her stool or black  stools.  Has been having some gas pain in her upper abdomen and LLQ which only occurs in the evening.  Sometimes LLQ pain is sharp, but again only occurs in the evening and is better during the day.  Denies any sensation of incomplete evacuation.  Denies rectal pain with bowel movements.  Stools are loose but not watery.  Has not had stool tested for infection.  Previously could go a few days without a bowel movement.  Has had 1 episode of fecal incontinence and taken Imodium 1 time.  States that she was on an antibiotic a month or 2 ago for a UTI, but this caused a yeast infection so she stopped taking it.  She was already experiencing loose stools prior to starting the antibiotic.  States she has been on a probiotic for a while, just recently stopped because she ran out.  She is currently taking 40 mg of Protonix  which was increased to twice daily.  This seems to have helped with some of her upper GI symptoms which include nausea and belching.  She denies any vomiting, heartburn, acid reflux, dysphagia.  Tums also help with her upper GI symptoms.  States she has been on Trulicity  for years and denies any recent dose changes.  Reports unintentional weight loss of 5 to 10 pounds over the last couple of months.  Denies any family history of colon cancer or colon polyps.  Thinks she may have had polyps in the past.  Per chart review looks like she may have had a colonoscopy 15 years ago with finding of a polyp?  Report unavailable.  She denies any chest  pain or shortness of breath.  Has not had follow-up ultrasound for renal cyst but states that she is following up with her PCP soon and will bring it up at that appointment.  Previous GI Procedures/Imaging   CT A/P 02/06/2024 1. No acute localizing process in the abdomen or pelvis. 2. New pneumobilia in the left lobe of the liver. Patient is status post cholecystectomy. Findings may be related to prior sphincterotomy. Please correlate  clinically. 3. New mildly hyperdense low-attenuation area in the left kidney measuring 3 cm. This may represent a hemorrhagic or proteinaceous cyst. Recommend further evaluation with ultrasound. 4. Colonic diverticulosis without evidence for diverticulitis. 5. Stable right adrenal nodule, favored as benign.  EGD 10/19/2020 - Normal larynx.  - Normal esophagus.  - Multiple fundic gland polyps.  - Erythematous mucosa in the gastric body and antrum.  - Normal examined duodenum.  - Several biopsies were obtained in the gastric body and in the gastric antrum. Path: 1. Surgical [P], gastric antrum - REACTIVE GASTROPATHY - NO H. PYLORI OR INTESTINAL METAPLASIA IDENTIFIED - SEE COMMENT 2. Surgical [P], gastric body - BENIGN GASTRIC MUCOSA - NO H. PYLORI, INTESTINAL METAPLASIA OR MALIGNANCY IDENTIFIED  If biopsies negative for H pylori, the overall clinical picture suggests functional dyspepsia and possible side effect of diabetic medicines.  Colonoscopy 03/15/2014 Moderate diverticulosis noted in the ascending colon, at the cecum, in the transverse colon, sigmoid colon, and left colon.   Recall 10 years.  Past Medical History:  Diagnosis Date   Allergy AMOXICILLAN   Arthritis    Chest pain    Nuclear, adenosine ,  December, 2013, low risk nuclear scan with small, moderate in intensity, fixed anteroseptal defect. This is possibly related to an LBBB versus small prior infarct. 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   Type II or unspecified type diabetes mellitus without mention of complication, not stated as uncontrolled     Past Surgical History:  Procedure Laterality Date    APPENDECTOMY     BREAST BIOPSY Left 10/2017   BREAST BIOPSY Right 03/2019   BREAST BIOPSY Right 08/26/2023   US  RT BREAST BX W LOC DEV 1ST LESION IMG BX SPEC US  GUIDE 08/26/2023 [facility] MAMMOGRAPHY   BREAST LUMPECTOMY Right 2020   BREAST LUMPECTOMY WITH RADIOACTIVE SEED AND SENTINEL LYMPH NODE BIOPSY Right 04/15/2019   Procedure: RIGHT BREAST PARTIAL MASTECTOMY WITH RADIOACTIVE SEED AND SENTINEL LYMPH NODE BIOPSY;  Surgeon: Vernetta Berg, MD;  Location: [facility] OR;  Service: General;  Laterality: Right;   CESAREAN SECTION     x2 ? w/appy   CHOLECYSTECTOMY     ESOPHAGOGASTRODUODENOSCOPY     HERNIA REPAIR     INTRAMEDULLARY (IM) NAIL INTERTROCHANTERIC Left 11/27/2023   Procedure: LEFT OPEN REDUCTION INTERNAL FIXATION (ORIF) INTERTROCHANTOR;  Surgeon: Jerri Kay HERO, MD;  Location: [facility] OR;  Service: Orthopedics;  Laterality: Left;   KNEE ARTHROSCOPY Right 11/2005   LIVER BIOPSY     PANCREATIC CYST EXCISION     PORT-A-CATH REMOVAL Left 04/25/2020   Procedure: REMOVAL PORT-A-CATH;  Surgeon: Vernetta Berg, MD;  Location: [facility];  Service: General;  Laterality: Left;  PORTACATH PLACEMENT Left 04/15/2019   Procedure: INSERTION PORT-A-CATH WITH ULTRASOUND;  Surgeon: Vernetta Berg, MD;  Location[facility] OR;  Service: General;  Laterality: Left;   RIGHT/LEFT HEART CATH AND CORONARY ANGIOGRAPHY N/A 09/28/2019   Procedure: RIGHT/LEFT HEART CATH AND CORONARY ANGIOGRAPHY;  Surgeon: Rolan Ezra RAMAN, MD;  Location: [facility] INVASIVE CV LAB;  Service: Cardiovascular;  Laterality: N/A;   SPLENECTOMY     TONSILLECTOMY AND ADENOIDECTOMY     TUBAL LIGATION  06/24/78    Current Outpatient Medications  Medication Sig Dispense Refill   acetaminophen  (TYLENOL ) 500 MG tablet Take 1,000 mg by mouth every 6 (six) hours as needed for moderate pain.     anastrozole  (ARIMIDEX ) 1 MG tablet Take 1 tablet (1 mg total) by mouth daily. 90 tablet 3   aspirin EC 81 MG tablet Take 162 mg by mouth daily. Swallow  whole.     B Complex-C (SUPER B COMPLEX PO) Take 1 tablet by mouth daily.     buPROPion  (WELLBUTRIN  XL) 300 MG 24 hr tablet Take 1 tablet (300 mg total) by mouth every morning. 90 tablet 1   carvedilol  (COREG ) 6.25 MG tablet Take 1 tablet (6.25 mg total) by mouth 2 (two) times daily with a meal. 180 tablet 3   Cholecalciferol  (VITAMIN D ) 50 MCG (2000 UT) tablet Take 2,000 Units by mouth daily.     Continuous Blood Gluc Receiver (FREESTYLE LIBRE 2 READER) DEVI 1 each by Does not apply route daily. 6 each 3   Continuous Glucose Sensor (FREESTYLE LIBRE 2 SENSOR) MISC APPLY AS DIRECTED EVERY 14 DAYS 6 each 3   Dulaglutide  (TRULICITY ) 3 MG/0.5ML SOAJ Inject 3 mg into the skin once a week. 6 mL 3   EASY COMFORT PEN NEEDLES 32G X 4 MM MISC USE AS DIRECTED EVERY DAY 100 each 3   ENTRESTO  24-26 MG TAKE 1 TABLET BY MOUTH TWICE DAILY 60 tablet 11   furosemide  (LASIX ) 40 MG tablet Take 1 tablet (40 mg total) by mouth as needed. For weight gain of 3ln in 24 hours or 5lb in a week     insulin  glargine (LANTUS ) 100 UNIT/ML injection Inject 12 Units into the skin daily after breakfast.     JARDIANCE  25 MG TABS tablet TAKE 1 TABLET BY MOUTH DAILY 90 tablet 3   meclizine  (ANTIVERT ) 25 MG tablet Take 25 mg by mouth 3 (three) times daily as needed for dizziness.     metFORMIN  (GLUCOPHAGE ) 1000 MG tablet TAKE 1 TABLET BY MOUTH TWICE DAILY WITH A MEAL 180 tablet 3   Naproxen Sodium (ALEVE PO) Take 1 tablet by mouth as needed.     pantoprazole  (PROTONIX ) 40 MG tablet Take 1 tablet (40 mg total) by mouth 2 (two) times daily. 180 tablet 0   Probiotic Product (PROBIOTIC ADVANCED PO) Take 1 tablet by mouth in the morning and at bedtime.     repaglinide  (PRANDIN ) 1 MG tablet Take 1 tablet (1 mg total) by mouth daily before supper. 90 tablet 3   rosuvastatin  (CRESTOR ) 10 MG tablet TAKE 1 TABLET BY MOUTH DAILY 30 tablet 11   sertraline  (ZOLOFT ) 100 MG tablet Take 1 tablet (100 mg total) by mouth daily. 90 tablet 3    spironolactone  (ALDACTONE ) 25 MG tablet TAKE 1 TABLET BY MOUTH DAILY 90 tablet 3   Vitamin E 180 MG CAPS Take 180 mg by mouth at bedtime.      zinc  gluconate 50 MG tablet Take 50 mg by mouth daily.  No current facility-administered medications for this visit.    Allergies as of 04/08/2024 - Review Complete 04/08/2024  Allergen Reaction Noted   Invokana  [canagliflozin ] Other (See Comments) 08/01/2015   Amoxicillin  Other (See Comments) 07/14/2012    Family History  Problem Relation Age of Onset   Liver disease Mother    Dementia Mother    Diabetes Mother        borderline   Depression Mother    Coronary artery disease Father    Heart attack Father    Hypertension Father    Heart disease Father    Early death Father    Cancer Other        leukemia   Stroke Maternal Grandfather    Alcohol abuse Maternal Grandfather    Hyperlipidemia Brother        Amyloidosis   Cancer Brother    Hypertension Brother    Diabetes Maternal Grandmother    Cancer Paternal Uncle        unknown   Heart attack Paternal Grandmother    Heart attack Paternal Uncle    Hypertension Brother    Diabetes Daughter        borderline   Diabetes Sister    Heart disease Sister    Kidney disease Sister    Heart disease Brother    Colon cancer Neg Hx    Esophageal cancer Neg Hx    Rectal cancer Neg Hx    Stomach cancer Neg Hx     Social History   Tobacco Use   Smoking status: Never   Smokeless tobacco: Never  Vaping Use   Vaping status: Never Used  Substance Use Topics   Alcohol use: Yes    Comment: very rarely a glass of wine   Drug use: No     Review of Systems:    Constitutional: Reported unintentional weight loss, no fever or chills Cardiovascular: No chest pain Respiratory: No SOB or cough Gastrointestinal: See HPI and otherwise negative   Physical Exam:  Vital signs: BP 108/64   Pulse 88   Ht 5' 2 (1.575 m)   Wt 125 lb 9.6 oz (57 kg)   SpO2 99%   BMI 22.97 kg/m   Wt  Readings from Last 3 Encounters:  04/08/24 125 lb 9.6 oz (57 kg)  04/01/24 124 lb 12.8 oz (56.6 kg)  02/25/24 126 lb 12.8 oz (57.5 kg)   Constitutional: Pleasant, well-appearing female in NAD, alert and cooperative, ambulates with a cane Head:  Normocephalic and atraumatic.  Eyes: No scleral icterus. Respiratory: Respirations even and unlabored. Lungs clear to auscultation bilaterally.  No wheezes, crackles, or rhonchi.  Cardiovascular:  Regular rate and rhythm. No murmurs. No peripheral edema. Gastrointestinal:  Soft, nondistended, nontender. No rebound or guarding. Normal bowel sounds. No appreciable masses or hepatomegaly. Rectal:  Not performed.  Neurologic:  Alert and oriented x4;  grossly normal neurologically.  Skin:   Dry and intact without significant lesions or rashes. Psychiatric: Oriented to person, place and time. Demonstrates good judgement and reason without abnormal affect or behaviors.   RELEVANT LABS AND IMAGING: CBC    Component Value Date/Time   WBC 14.2 (H) 02/06/2024 1533   RBC 3.61 (L) 02/06/2024 1533   HGB 10.4 (L) 02/06/2024 1533   HGB 10.8 (L) 07/29/2019 0726   HGB 11.5 (L) 01/08/2014 1121   HCT 32.3 (L) 02/06/2024 1533   HCT 36.1 01/08/2014 1121   PLT 439 (H) 02/06/2024 1533   PLT 540 (H) 07/29/2019 9273  PLT 692 (H) 01/08/2014 1121   MCV 89.5 02/06/2024 1533   MCV 87.2 01/08/2014 1121   MCH 28.8 02/06/2024 1533   MCHC 32.2 02/06/2024 1533   RDW 13.7 02/06/2024 1533   RDW 15.5 (H) 01/08/2014 1121   LYMPHSABS 5.3 (H) 02/06/2024 1533   LYMPHSABS 7.4 (H) 01/08/2014 1121   MONOABS 1.5 (H) 02/06/2024 1533   MONOABS 1.4 (H) 01/08/2014 1121   EOSABS 0.3 02/06/2024 1533   EOSABS 0.3 01/08/2014 1121   BASOSABS 0.1 02/06/2024 1533   BASOSABS 0.0 01/08/2014 1121    CMP     Component Value Date/Time   NA 140 04/01/2024 1353   NA 146 (H) 05/19/2018 0843   K 4.5 04/01/2024 1353   CL 106 04/01/2024 1353   CO2 23 04/01/2024 1353   GLUCOSE 105 (H)  04/01/2024 1353   BUN 23 04/01/2024 1353   BUN 19 05/19/2018 0843   CREATININE 1.09 (H) 04/01/2024 1353   CREATININE 0.86 07/07/2021 1617   CALCIUM  9.6 04/01/2024 1353   PROT 6.3 (L) 02/06/2024 1535   PROT 6.7 05/19/2018 0843   ALBUMIN 4.1 02/06/2024 1535   ALBUMIN 4.6 05/19/2018 0843   AST 15 02/06/2024 1535   AST 17 07/29/2019 0726   ALT 13 02/06/2024 1535   ALT 19 07/29/2019 0726   ALKPHOS 81 02/06/2024 1535   BILITOT <0.2 02/06/2024 1535   BILITOT 0.3 07/29/2019 0726   GFRNONAA 54 (L) 04/01/2024 1353   GFRNONAA >60 07/29/2019 0726   GFRNONAA 61 10/09/2017 1022   GFRAA >60 02/01/2020 0940   GFRAA >60 07/29/2019 0726   GFRAA 71 10/09/2017 1022   Echocardiogram 04/01/2024 1. Left ventricular ejection fraction, by estimation, is 55 to 60% . The left ventricle has normal function. The left ventricle has no regional wall motion abnormalities. Left ventricular diastolic parameters are consistent with Grade I diastolic dysfunction ( impaired relaxation) .  2. Right ventricular systolic function is normal. The right ventricular size is normal.  3. The mitral valve is abnormal. Mild mitral valve regurgitation. No evidence of mitral stenosis. Moderate mitral annular calcification.  4. The aortic valve is normal in structure. There is mild calcification of the aortic valve. Aortic valve regurgitation is not visualized. Aortic valve sclerosis/ calcification is present, without any evidence of aortic stenosis.  5. The inferior vena cava is normal in size with greater than 50% respiratory variability, suggesting right atrial pressure of 3 mmHg.  Assessment/Plan:   Change in bowel habits Loose stools History of colon polyps Generalized abdominal pain Belching Nausea Unintentional weight loss Anemia Patient seen today for complaint of diarrhea and unintentional weight loss.  Noticed a change in her normal bowel habits a few years ago, but in the last few months has been having loose stools  daily.  Only having 1 bowel movement daily and denies any blood in her stool or melena.  Has had some intermittent abdominal pain which tends to occur in her upper abdomen and left lower quadrant, usually in the evening and improves during the day.  Reports that she was on an antibiotic a couple months ago for a UTI but was already experiencing symptoms prior to that.  She is on Trulicity  and has been for a while with no recent dose changes.  Denies any other new medications. Last colonoscopy 03/15/2014 showed moderate diverticulosis and otherwise normal with recommended recall in 10 years.  She reports history of possible colon polyps in the past.  Overdue for repeat colonoscopy.  Has some  nausea and belching which is improved on twice daily pantoprazole  and with Tums.  Denies any dysphagia, heartburn, reflux.  Last EGD 10/19/2020 with findings of reactive gastropathy and negative for H. pylori, and symptoms thought to be due to functional dyspepsia and possible effect of GLP-1 agonist.  She has been found to have anemia in the last year.  Will check labs today to evaluate for iron deficiency and if so plan for upper endoscopy in addition to colonoscopy.  Rule out infectious diarrhea prior to scheduling.  - Labs today: CBC, CMP, iron/ferritin, B12, folate, TSH, TTG, IgA - Stool culture and C. Difficile - If stool studies negative for infection schedule colonoscopy. I thoroughly discussed the procedure with the patient to include nature of the procedure, alternatives, benefits, and risks (including but not limited to bleeding, infection, perforation, anesthesia/cardiac/pulmonary complications). Patient verbalized understanding and gave verbal consent to proceed with procedure.  - Schedule EGD to be done with colonoscopy if evidence of iron deficiency anemia on labs. - Continue Protonix  40 mg twice daily (prescribed by PCP) - Recommend addition of fiber supplement such as FiberCon - Consider trial of  cholestyramine based on history of cholecystectomy - Consider empiric treatment for SIBO   Camie Furbish, PA-C [facility] 04/08/2024, 9:29 AM  Patient Care Team: Jason Leita Repine, FNP (Inactive) as PCP - General (Internal Medicine) [name], Aleene PARAS, MD (Inactive) as PCP - Cardiology (Cardiology) Tyree Nanetta SAILOR, RN as Oncology Nurse Navigator Rolan Ezra RAMAN, MD as Consulting Physician (Cardiology) [employer] Tobie, Donika K, DO as Consulting Physician (Neurology)

## 2024-04-08 NOTE — Patient Instructions (Signed)
 Your provider has requested that you go to the basement level for lab work before leaving today. Press B on the elevator. The lab is located at the first door on the left as you exit the elevator.  A high fiber diet with plenty of fluids (up to 8 glasses of water  daily) is suggested to relieve these symptoms.  Metamucil, 1 tablespoon once daily can be used to keep bowels regular if needed.  Continue Protonix  twice daily.   _______________________________________________________  If your blood pressure at your visit was 140/90 or greater, please contact your primary care physician to follow up on this.  _______________________________________________________  If you are age 74 or older, your body mass index should be between 23-30. Your Body mass index is 22.97 kg/m. If this is out of the aforementioned range listed, please consider follow up with your Primary Care Provider.  If you are age 20 or younger, your body mass index should be between 19-25. Your Body mass index is 22.97 kg/m. If this is out of the aformentioned range listed, please consider follow up with your Primary Care Provider.   ________________________________________________________  The [facility] providers would like to encourage you to use MYCHART to communicate with providers for non-urgent requests or questions.  Due to long hold times on the telephone, sending your provider a message by [facility] may be a faster and more efficient way to get a response.  Please allow 48 business hours for a response.  Please remember that this is for non-urgent requests.  _______________________________________________________  [facility] is using a team-based approach to care.  Your team is made up of your doctor and two to three APPS. Our APPS (Nurse Practitioners and Physician Assistants) work with your physician to ensure care continuity for you. They are fully qualified to address your health concerns and develop a  treatment plan. They communicate directly with your gastroenterologist to care for you. Seeing the Advanced Practice Practitioners on your physician's team can help you by facilitating care more promptly, often allowing for earlier appointments, access to diagnostic testing, procedures, and other specialty referrals.   Due to recent changes in healthcare laws, you may see the results of your imaging and laboratory studies on MyChart before your provider has had a chance to review them.  We understand that in some cases there may be results that are confusing or concerning to you. Not all laboratory results come back in the same time frame and the provider may be waiting for multiple results in order to interpret others.  Please give us  48 hours in order for your provider to thoroughly review all the results before contacting the office for clarification of your results.   Thank you for choosing me and [facility].

## 2024-04-08 NOTE — ED Triage Notes (Signed)
 Pt reports she was told to come to the ER due to elevated kidney function. Pt has no complaints.

## 2024-04-08 NOTE — ED Provider Notes (Signed)
 [place] EMERGENCY DEPARTMENT AT [facility] Provider Note   CSN: [id number] Arrival date & time: 04/08/24  1629     Patient presents with: Abnormal Labs   SANAIA JASSO is a 73 y.o. female.  {Add pertinent medical, surgical, social history, OB history to HPI:32947} HPI      73 year old female with a history of diabetes, left bundle branch block, breast cancer, CHF followed by Dr. Rolan (most recent EF 55%), hypertension, hyperlipidemia    At her most recent cardiology visit, they reported she does not need regular Lasix , plan to continue Entresto , continue spironolactone  decreased Coreg   She saw gastroenterology this morning with concerns for diarrhea and weight loss History of constipation, wakes up in the morning with not firm stool, not diarrhea, just having this and in the morning Abdominal discomfort, seeing Dr. Jason who increased acid reflux meds, helped at first, but the gas pains started back again.  Nausea, no vomiting. No current abdominal pain. NO fever No cp or dyspnea, urinating ok No leg swelling  Has not used the lasix /not needed it   Past Medical History:  Diagnosis Date   Allergy AMOXICILLAN   Arthritis    Chest pain    Nuclear, adenosine ,  December, 2013, low risk nuclear scan with small, moderate in intensity, fixed anteroseptal defect. This is possibly related to an LBBB versus small prior infarct. 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  Authorizing Provider  acetaminophen  (TYLENOL ) 500 MG tablet Take 1,000 mg by mouth every 6 (six) hours as needed for moderate pain.    [provider]  anastrozole  (ARIMIDEX ) 1 MG tablet Take 1 tablet (1 mg total) by mouth daily. 05/10/23   Gudena, Vinay, MD  aspirin EC 81 MG tablet Take 162 mg by mouth daily. Swallow whole.    [provider]  B Complex-C (SUPER B COMPLEX PO) Take 1 tablet by mouth daily.    [provider]  buPROPion  (WELLBUTRIN  XL) 300 MG 24 hr tablet Take 1 tablet (300 mg total) by mouth every morning. 03/21/23   Jason Leita Repine, FNP  carvedilol  (COREG ) 6.25 MG tablet Take 1 tablet (6.25 mg total) by mouth 2 (two) times daily with a meal. 04/01/24   Rolan Ezra RAMAN, MD  Cholecalciferol  (VITAMIN D ) 50 MCG (2000 UT) tablet Take 2,000 Units by mouth daily.    [provider]  Continuous Blood Gluc Receiver (FREESTYLE LIBRE 2 READER) DEVI 1 each by Does not apply route daily. 01/17/22   Trixie File, MD  Continuous Glucose Sensor (FREESTYLE LIBRE 2 SENSOR) MISC APPLY AS DIRECTED EVERY 14 DAYS 05/03/23   Trixie File, MD  Dulaglutide  (TRULICITY ) 3 MG/0.5ML SOAJ Inject 3 mg into the skin once a week. 06/24/23   Trixie File, MD  EASY COMFORT PEN NEEDLES 32G X 4 MM MISC USE AS DIRECTED EVERY DAY 05/27/23   Trixie File, MD  ENTRESTO  24-26 MG TAKE 1 TABLET BY  MOUTH TWICE DAILY 09/12/23   Rolan Ezra RAMAN, MD  furosemide  (LASIX ) 40 MG tablet Take 1 tablet (40 mg total) by mouth as needed. For weight gain of 3ln in 24 hours or 5lb in a week 05/16/23 09/12/24  Rolan Ezra RAMAN, MD  insulin  glargine (LANTUS ) 100 UNIT/ML injection Inject 12 Units into the skin daily after breakfast.    [provider]  JARDIANCE  25 MG TABS tablet TAKE 1 TABLET BY MOUTH DAILY 11/12/23   Trixie File, MD  meclizine  (ANTIVERT ) 25 MG tablet Take 25 mg by mouth 3 (three) times daily as needed for dizziness.    [provider]   metFORMIN  (GLUCOPHAGE ) 1000 MG tablet TAKE 1 TABLET BY MOUTH TWICE DAILY WITH A MEAL 10/23/23   Trixie File, MD  Naproxen Sodium (ALEVE PO) Take 1 tablet by mouth as needed.    [provider]  pantoprazole  (PROTONIX ) 40 MG tablet Take 1 tablet (40 mg total) by mouth 2 (two) times daily. 02/06/24   Jason Leita Repine, FNP  Probiotic Product (PROBIOTIC ADVANCED PO) Take 1 tablet by mouth in the morning and at bedtime.    [provider]  repaglinide  (PRANDIN ) 1 MG tablet Take 1 tablet (1 mg total) by mouth daily before supper. 02/25/24   Trixie File, MD  rosuvastatin  (CRESTOR ) 10 MG tablet TAKE 1 TABLET BY MOUTH DAILY 08/13/23   Jason Leita Repine, FNP  sertraline  (ZOLOFT ) 100 MG tablet Take 1 tablet (100 mg total) by mouth daily. 03/24/24   Webb, Padonda B, FNP  spironolactone  (ALDACTONE ) 25 MG tablet TAKE 1 TABLET BY MOUTH DAILY 01/08/24   McLean, Dalton S, MD  Vitamin E 180 MG CAPS Take 180 mg by mouth at bedtime.     [provider]  zinc  gluconate 50 MG tablet Take 50 mg by mouth daily.    [provider]    Allergies: Invokana  [canagliflozin ] and Amoxicillin     Review of Systems  Updated Vital Signs BP 115/77 (BP Location: Left Arm)   Pulse 77   Temp 98.4 F (36.9 C) (Oral)   Resp 16   SpO2 99%   Physical Exam  (all labs ordered are listed, but only abnormal results are displayed) Labs Reviewed - No data to display  EKG: None  Radiology: No results found.  {Document cardiac monitor, telemetry assessment procedure when appropriate:32947} Procedures   Medications Ordered in the ED - No data to display    {Click here for ABCD2, HEART and other calculators REFRESH Note before signing:1}                              Medical Decision Making Amount and/or Complexity of Data Reviewed Labs: ordered.   ***  {Document critical care time when appropriate  Document review of labs and clinical decision tools ie CHADS2VASC2,  etc  Document your independent review of radiology images and any outside records  Document your discussion with family members, caretakers and with consultants  Document social determinants of health affecting pt's care  Document your decision making why or why not admission, treatments were needed:32947:::1}   Final diagnoses:  None    ED Discharge Orders     None

## 2024-04-09 ENCOUNTER — Telehealth (HOSPITAL_COMMUNITY): Payer: Self-pay

## 2024-04-09 LAB — HOUSE ACCOUNT TRACKING

## 2024-04-09 LAB — TISSUE TRANSGLUTAMINASE, IGA: (tTG) Ab, IgA: <1 U/mL

## 2024-04-09 LAB — IGA: Immunoglobulin A: 77 mg/dL (ref 70–320)

## 2024-04-09 MED ORDER — SPIRONOLACTONE 25 MG PO TABS: 12.5000 mg | ORAL_TABLET | Freq: Every day | ORAL | Status: AC

## 2024-04-09 NOTE — Telephone Encounter (Addendum)
 Pt aware, agreeable, and verbalized understanding Advised to drink more fluid and cut spironolactone  to 12.5 mg daily.  Med list up dated   ----- Message from Ezra Shuck sent at 04/08/2024  5:05 PM EDT ----- I don't think she needs to go to the ER unless she somehow feels bad.  I am not impressed by the BUN and creatinine.  She should drink more fluid and can cut the spironolactone  back to 12.5 mg daily. ----- Message ----- From: Concha Almarie LABOR, LPN Sent: 89/84/7974   3:54 PM EDT To: Ezra GORMAN Shuck, MD  Patient is aware and has agreed to go to the ER for possible acute kidney injury

## 2024-04-09 NOTE — Progress Notes (Signed)
 ____________________________________________________________  Attending physician addendum:  Thank you for sending this case to me. I have reviewed the entire note and agree with the plan.   Victory Brand, MD  ____________________________________________________________

## 2024-04-14 ENCOUNTER — Telehealth: Payer: Self-pay

## 2024-04-14 ENCOUNTER — Other Ambulatory Visit

## 2024-04-14 ENCOUNTER — Ambulatory Visit: Admitting: Orthopaedic Surgery

## 2024-04-14 DIAGNOSIS — R1084 Generalized abdominal pain: Secondary | ICD-10-CM | POA: Diagnosis not present

## 2024-04-14 DIAGNOSIS — R110 Nausea: Secondary | ICD-10-CM | POA: Diagnosis not present

## 2024-04-14 DIAGNOSIS — Z860100 Personal history of colon polyps, unspecified: Secondary | ICD-10-CM

## 2024-04-14 DIAGNOSIS — R634 Abnormal weight loss: Secondary | ICD-10-CM

## 2024-04-14 DIAGNOSIS — M1612 Unilateral primary osteoarthritis, left hip: Secondary | ICD-10-CM | POA: Diagnosis not present

## 2024-04-14 DIAGNOSIS — R194 Change in bowel habit: Secondary | ICD-10-CM | POA: Diagnosis not present

## 2024-04-14 DIAGNOSIS — R195 Other fecal abnormalities: Secondary | ICD-10-CM

## 2024-04-14 DIAGNOSIS — D649 Anemia, unspecified: Secondary | ICD-10-CM

## 2024-04-14 DIAGNOSIS — S72145A Nondisplaced intertrochanteric fracture of left femur, initial encounter for closed fracture: Secondary | ICD-10-CM

## 2024-04-14 DIAGNOSIS — R142 Eructation: Secondary | ICD-10-CM | POA: Diagnosis not present

## 2024-04-14 NOTE — Progress Notes (Signed)
 Office Visit Note   Patient: Margaret Norman           Date of Birth: 10-21-1950           MRN: [id number] Visit Date: 04/14/2024              Requested by: No referring provider defined for this encounter. PCP: Patient, No Pcp Per   Assessment & Plan: Visit Diagnoses:  1. Primary osteoarthritis of left hip   2. Nondisplaced intertrochanteric fracture of left femur, initial encounter for closed fracture (HCC)     Plan: History of Present Illness Huxley C Rede is a 73 year old female who presents for follow-up for left hip pain.  Patient is now 4 months status post surgical repair of nondisplaced intertrochanteric fracture with known baseline advanced DJD of the left hip.  She is ready to make arrangements for hardware removal and conversion to total hip arthroplasty.  Examination of the left hip shows fully healed surgical scar.  Pain with and limited range of motion of the hip.  Assessment and Plan Left hip osteoarthritis and intertrochanteric fracture Four months post-surgery with persistent nocturnal pain likely due to osteoarthritis.  - Order CT scan of left hip to ensure fracture healing prior to conversion to THA. - Obtain medical clearance from primary care provider. - Obtain cardiology clearance from Dr. Rolan. - Detailed surgical plan discussed.  Follow-Up Instructions: No follow-ups on file.   Orders:  Orders Placed This Encounter  Procedures   CT HIP LEFT WO CONTRAST   No orders of the defined types were placed in this encounter.     Procedures: No procedures performed   Clinical Data: No additional findings.   Subjective: Chief Complaint  Patient presents with   Left Hip - Pain    HPI  Review of Systems  Constitutional: Negative.   HENT: Negative.    Eyes: Negative.   Respiratory: Negative.    Cardiovascular: Negative.   Endocrine: Negative.   Musculoskeletal: Negative.   Neurological: Negative.   Hematological: Negative.    Psychiatric/Behavioral: Negative.    All other systems reviewed and are negative.    Objective: Vital Signs: There were no vitals taken for this visit.  Physical Exam Vitals and nursing note reviewed.  Constitutional:      Appearance: She is well-developed.  HENT:     Head: Atraumatic.     Nose: Nose normal.  Eyes:     Extraocular Movements: Extraocular movements intact.  Cardiovascular:     Pulses: Normal pulses.  Pulmonary:     Effort: Pulmonary effort is normal.  Abdominal:     Palpations: Abdomen is soft.  Musculoskeletal:     Cervical back: Neck supple.  Skin:    General: Skin is warm.     Capillary Refill: Capillary refill takes less than 2 seconds.  Neurological:     Mental Status: She is alert. Mental status is at baseline.  Psychiatric:        Behavior: Behavior normal.        Thought Content: Thought content normal.        Judgment: Judgment normal.     Ortho Exam  Specialty Comments:  No specialty comments available.  Imaging: No results found.   PMFS History: Patient Active Problem List   Diagnosis Date Noted   Primary osteoarthritis of left hip 04/14/2024   Nondisplaced intertrochanteric fracture of left femur, initial encounter for closed fracture (HCC) 11/24/2023   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  Port-A-Cath in place 06/10/2019   Breast cancer (HCC) 04/15/2019   Second degree AV block    Malignant neoplasm of upper-outer quadrant of right breast in female, estrogen receptor positive (HCC) 03/25/2019   Thyromegaly 01/10/2018   Overweight (BMI 25.0-29.9) 08/13/2017   Upper respiratory tract infection 07/30/2017   Type 2 diabetes mellitus with hyperglycemia, with long-term current use of insulin  (HCC) 03/19/2017   Thiamine  deficiency 01/04/2017   Tinnitus aurium, 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   Deficiency anemia 04/30/2016   Routine general medical examination at a health care facility 04/27/2015   Abdominal wall hernia 04/26/2015   Family  history of hemochromatosis 09/13/2014   Acute maxillary sinusitis 05/14/2014   History of colonic polyps 03/10/2014   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   Lumbosacral spondylosis without myelopathy 05/12/2013   Insomnia, persistent 02/04/2013   Obesity (BMI 30-39.9) 02/04/2013   Asthma with exacerbation 09/22/2012   Visit for screening mammogram 07/04/2012   Nephrolithiasis    HTN (hypertension)    Gallstones    LBBB (left bundle branch block)    Leukocytosis 12/20/2008   Hyperlipidemia with target LDL less than 100 09/04/2007   Chronic depression 09/04/2007   GERD 09/04/2007   Past Medical History:  Diagnosis Date   Allergy AMOXICILLAN   Arthritis    Chest pain    Nuclear, adenosine ,  December, 2013, low risk nuclear scan with small, moderate in intensity, fixed anteroseptal defect. This is possibly related to an LBBB versus small prior infarct. 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   Type II or unspecified type diabetes mellitus without mention of complication, not stated as uncontrolled     Family History  Problem Relation Age of Onset   Liver disease Mother    Dementia Mother    Diabetes Mother        borderline   Depression Mother    Coronary artery disease Father    Heart attack Father    Hypertension Father    Heart disease Father    Early death Father    Cancer Other        leukemia   Stroke Maternal Grandfather    Alcohol abuse Maternal Grandfather    Hyperlipidemia Brother        Amyloidosis   Cancer Brother    Hypertension Brother    Diabetes Maternal Grandmother    Cancer Paternal Uncle        unknown   Heart attack Paternal Grandmother     Heart attack Paternal Uncle    Hypertension Brother    Diabetes Daughter        borderline   Diabetes Sister    Heart disease Sister    Kidney disease Sister    Heart disease Brother    Colon cancer Neg Hx    Esophageal cancer Neg Hx    Rectal cancer Neg Hx    Stomach cancer Neg Hx     Past Surgical History:  Procedure Laterality Date   APPENDECTOMY     BREAST BIOPSY Left 10/2017   BREAST BIOPSY Right 03/2019   BREAST BIOPSY Right 08/26/2023   US  RT BREAST BX W LOC DEV  1ST LESION IMG BX SPEC US  GUIDE 08/26/2023 [facility] MAMMOGRAPHY   BREAST LUMPECTOMY Right 2020   BREAST LUMPECTOMY WITH RADIOACTIVE SEED AND SENTINEL LYMPH NODE BIOPSY Right 04/15/2019   Procedure: RIGHT BREAST PARTIAL MASTECTOMY WITH RADIOACTIVE SEED AND SENTINEL LYMPH NODE BIOPSY;  Surgeon: Vernetta Berg, MD;  Location: [facility] OR;  Service: General;  Laterality: Right;   CESAREAN SECTION     x2 ? w/appy   CHOLECYSTECTOMY     ESOPHAGOGASTRODUODENOSCOPY     HERNIA REPAIR     INTRAMEDULLARY (IM) NAIL INTERTROCHANTERIC Left 11/27/2023   Procedure: LEFT OPEN REDUCTION INTERNAL FIXATION (ORIF) INTERTROCHANTOR;  Surgeon: Jerri Kay HERO, MD;  Location: [facility] OR;  Service: Orthopedics;  Laterality: Left;   KNEE ARTHROSCOPY Right 11/2005   LIVER BIOPSY     PANCREATIC CYST EXCISION     PORT-A-CATH REMOVAL Left 04/25/2020   Procedure: REMOVAL PORT-A-CATH;  Surgeon: Vernetta Berg, MD;  Location: [facility];  Service: General;  Laterality: Left;   PORTACATH PLACEMENT Left 04/15/2019   Procedure: INSERTION PORT-A-CATH WITH ULTRASOUND;  Surgeon: Vernetta Berg, MD;  Location: [facility] OR;  Service: General;  Laterality: Left;   RIGHT/LEFT HEART CATH AND CORONARY ANGIOGRAPHY N/A 09/28/2019   Procedure: RIGHT/LEFT HEART CATH AND CORONARY ANGIOGRAPHY;  Surgeon: Rolan Ezra RAMAN, MD;  Location: [facility] INVASIVE CV LAB;  Service: Cardiovascular;  Laterality: N/A;   SPLENECTOMY     TONSILLECTOMY AND ADENOIDECTOMY     TUBAL LIGATION   06/24/78   Social History   Occupational History   Occupation: CSR    Employer: [employer]   Occupation: retired  Tobacco Use   Smoking status: Never   Smokeless tobacco: Never  Vaping Use   Vaping status: Never Used  Substance and Sexual Activity   Alcohol use: Yes    Comment: very rarely a glass of wine   Drug use: No   Sexual activity: Not Currently    Birth control/protection: Pill    Comment: Tubes tied

## 2024-04-14 NOTE — Telephone Encounter (Signed)
 Patient given surgical clearance forms for cardiology and PCP. Aware that we must receive clearance forms back before being able to proceed with scheduling surgery.

## 2024-04-15 LAB — CLOSTRIDIUM DIFFICILE TOXIN B, QUALITATIVE, REAL-TIME PCR: Toxigenic C. Difficile by PCR: NOT DETECTED

## 2024-04-16 ENCOUNTER — Ambulatory Visit
Admission: RE | Admit: 2024-04-16 | Discharge: 2024-04-16 | Disposition: A | Discharge status: Home / Self Care | Source: Ambulatory Visit | Attending: Orthopaedic Surgery | Admitting: Orthopaedic Surgery

## 2024-04-16 DIAGNOSIS — M1612 Unilateral primary osteoarthritis, left hip: Secondary | ICD-10-CM

## 2024-04-18 LAB — STOOL CULTURE: E coli, Shiga toxin Assay: NEGATIVE

## 2024-04-20 ENCOUNTER — Ambulatory Visit: Payer: Self-pay | Admitting: Orthopaedic Surgery

## 2024-04-22 ENCOUNTER — Telehealth: Payer: Self-pay | Admitting: Gastroenterology

## 2024-04-22 ENCOUNTER — Other Ambulatory Visit: Payer: Self-pay

## 2024-04-22 DIAGNOSIS — R1084 Generalized abdominal pain: Secondary | ICD-10-CM

## 2024-04-22 DIAGNOSIS — R110 Nausea: Secondary | ICD-10-CM

## 2024-04-22 DIAGNOSIS — Z860100 Personal history of colon polyps, unspecified: Secondary | ICD-10-CM

## 2024-04-22 DIAGNOSIS — R194 Change in bowel habit: Secondary | ICD-10-CM

## 2024-04-22 DIAGNOSIS — D509 Iron deficiency anemia, unspecified: Secondary | ICD-10-CM

## 2024-04-22 DIAGNOSIS — R634 Abnormal weight loss: Secondary | ICD-10-CM

## 2024-04-22 DIAGNOSIS — R195 Other fecal abnormalities: Secondary | ICD-10-CM

## 2024-04-22 MED ORDER — NA SULFATE-K SULFATE-MG SULF 17.5-3.13-1.6 GM/177ML PO SOLN: 1.0000 | Freq: Once | ORAL | 0 refills | Status: AC

## 2024-04-22 NOTE — Telephone Encounter (Signed)
 Inbound call from patient stating she needed to schedule EGD and colonoscopy with Dr. Legrand Per Camie. Patient has been scheduled for 12/17 at 10:00. Patient is requesting a call back to discuss prep instructions and also is requesting verification if she needs to keep her scheduled appointment on 11/26 at 10:00. Please advise.

## 2024-04-22 NOTE — Telephone Encounter (Signed)
 Attempted to reach pt to discuss instructions for colon/endo appt. No answer, did leave vm. Per provider, appt that was scheduled for 11/26 was canceled. Pt notified of all information via my chart. Instructions also sent via my chart.

## 2024-04-27 ENCOUNTER — Encounter: Payer: Self-pay | Admitting: Radiology

## 2024-04-27 ENCOUNTER — Other Ambulatory Visit: Payer: Self-pay | Admitting: Internal Medicine

## 2024-04-28 DIAGNOSIS — H25043 Posterior subcapsular polar age-related cataract, bilateral: Secondary | ICD-10-CM | POA: Diagnosis not present

## 2024-04-28 DIAGNOSIS — H25013 Cortical age-related cataract, bilateral: Secondary | ICD-10-CM | POA: Diagnosis not present

## 2024-04-28 DIAGNOSIS — H18413 Arcus senilis, bilateral: Secondary | ICD-10-CM | POA: Diagnosis not present

## 2024-04-28 DIAGNOSIS — H2513 Age-related nuclear cataract, bilateral: Secondary | ICD-10-CM | POA: Diagnosis not present

## 2024-04-28 DIAGNOSIS — H2512 Age-related nuclear cataract, left eye: Secondary | ICD-10-CM | POA: Diagnosis not present

## 2024-04-30 ENCOUNTER — Encounter: Payer: Self-pay | Admitting: Nurse Practitioner

## 2024-04-30 ENCOUNTER — Encounter: Payer: Self-pay | Admitting: Internal Medicine

## 2024-04-30 ENCOUNTER — Ambulatory Visit (INDEPENDENT_AMBULATORY_CARE_PROVIDER_SITE_OTHER): Admitting: Nurse Practitioner

## 2024-04-30 VITALS — BP 128/80 | HR 78 | Temp 97.3°F | Ht 62.0 in | Wt 133.6 lb

## 2024-04-30 DIAGNOSIS — F32A Depression, unspecified: Secondary | ICD-10-CM | POA: Diagnosis not present

## 2024-04-30 DIAGNOSIS — K219 Gastro-esophageal reflux disease without esophagitis: Secondary | ICD-10-CM

## 2024-04-30 DIAGNOSIS — Z23 Encounter for immunization: Secondary | ICD-10-CM | POA: Diagnosis not present

## 2024-04-30 DIAGNOSIS — Z170 Estrogen receptor positive status [ER+]: Secondary | ICD-10-CM | POA: Diagnosis not present

## 2024-04-30 DIAGNOSIS — E785 Hyperlipidemia, unspecified: Secondary | ICD-10-CM | POA: Diagnosis not present

## 2024-04-30 DIAGNOSIS — Z794 Long term (current) use of insulin: Secondary | ICD-10-CM | POA: Diagnosis not present

## 2024-04-30 DIAGNOSIS — I5022 Chronic systolic (congestive) heart failure: Secondary | ICD-10-CM

## 2024-04-30 DIAGNOSIS — K439 Ventral hernia without obstruction or gangrene: Secondary | ICD-10-CM

## 2024-04-30 DIAGNOSIS — M1612 Unilateral primary osteoarthritis, left hip: Secondary | ICD-10-CM | POA: Diagnosis not present

## 2024-04-30 DIAGNOSIS — I10 Essential (primary) hypertension: Secondary | ICD-10-CM | POA: Diagnosis not present

## 2024-04-30 DIAGNOSIS — E1169 Type 2 diabetes mellitus with other specified complication: Secondary | ICD-10-CM

## 2024-04-30 DIAGNOSIS — I442 Atrioventricular block, complete: Secondary | ICD-10-CM | POA: Insufficient documentation

## 2024-04-30 DIAGNOSIS — H269 Unspecified cataract: Secondary | ICD-10-CM | POA: Insufficient documentation

## 2024-04-30 DIAGNOSIS — N898 Other specified noninflammatory disorders of vagina: Secondary | ICD-10-CM

## 2024-04-30 DIAGNOSIS — R8290 Unspecified abnormal findings in urine: Secondary | ICD-10-CM | POA: Diagnosis not present

## 2024-04-30 LAB — POCT URINALYSIS DIPSTICK
Bilirubin, UA: NEGATIVE
Blood, UA: NEGATIVE
Glucose, UA: POSITIVE — AB
Ketones, UA: NEGATIVE
Nitrite, UA: NEGATIVE
Protein, UA: NEGATIVE
Spec Grav, UA: 1.01 (ref 1.010–1.025)
Urobilinogen, UA: 0.2 U/dL
pH, UA: 7 (ref 5.0–8.0)

## 2024-04-30 MED ORDER — FLUCONAZOLE 150 MG PO TABS: 150.0000 mg | ORAL_TABLET | Freq: Once | ORAL | 0 refills | Status: AC

## 2024-04-30 MED ORDER — TRULICITY 3 MG/0.5ML ~~LOC~~ SOAJ: 3.0000 mg | SUBCUTANEOUS | 3 refills | Status: DC

## 2024-04-30 NOTE — Assessment & Plan Note (Signed)
 History of CHB transient while getting port-a-cath placed. Continue recommendations from cardiology.

## 2024-04-30 NOTE — Assessment & Plan Note (Signed)
 Chronic, stable. Continue Carvedilol  6.25mg  BID, spironolactone  12.5mg  daily, and Entresto  24-26mg  BID. Recent BMP, CBC reviewed.

## 2024-04-30 NOTE — Assessment & Plan Note (Signed)
 Chronic, stable. Euvolemic on exam today. Most recent echo 03/2024 showed EF 55-60%. Heart failure with left bundle branch block is managed with carvedilol , Entresto , and spironolactone . She reports no recent chest pain or dyspnea. Continue carvedilol  6.25 mg twice daily, Entresto  24-26mg  twice daily, and spironolactone  12.5 mg daily. Follow up with the cardiologist as scheduled.

## 2024-04-30 NOTE — Patient Instructions (Signed)
 It was great to see you!  We will check your urine today   Take diflucan  1 tablet today for possible yeast infection  I will fill out the form for your surgery   You will need to stop your trulicity  1 week before your surgery    Let's follow-up in 4 months, sooner if you have concerns.  If a referral was placed today, you will be contacted for an appointment. Please note that routine referrals can sometimes take up to 3-4 weeks to process. Please call our office if you haven't heard anything after this time frame.  Take care,  Tinnie Harada, NP

## 2024-04-30 NOTE — Assessment & Plan Note (Signed)
 Chronic, stable. Follow-up with any pain or concerns.

## 2024-04-30 NOTE — Assessment & Plan Note (Signed)
 Cataracts are scheduled for surgery on December 8th for the right eye and December 29th for the left eye. Proceed with scheduled cataract surgeries.

## 2024-04-30 NOTE — Progress Notes (Signed)
 New Patient Visit  BP 128/80 (BP Location: Right Arm, Patient Position: Sitting, Cuff Size: Normal)   Pulse 78   Temp (!) 97.3 F (36.3 C)   Ht 5' 2 (1.575 m)   Wt 133 lb 9.6 oz (60.6 kg)   SpO2 98%   BMI 24.44 kg/m    Subjective:    Patient ID: Margaret Norman, female    DOB: [birth year], 73 y.o.   MRN: [id number]  CC: Chief Complaint  Patient presents with   Transfer of Care    NP. Est. Care, Flu Vaccine, Clearance for left hip surgery    HPI: Margaret Norman is a 73 y.o. female presents to transfer care to a new provider.  Introduced to publishing rights manager role and practice setting.  All questions answered.  Discussed provider/patient relationship and expectations.  Discussed the use of AI scribe software for clinical note transcription with the patient, who gave verbal consent to proceed.  History of Present Illness   [name] is a 73 year old female with type 2 diabetes and a history of breast cancer who presents to establish care.  She manages type 2 diabetes with, Trulicity  3mg  weekly, Lantus  12 units daily, Jardiance  25mg  daily, repaglinide  1mg  daily, and metformin  1,000,mg BID. She experiences rare nocturnal hypoglycemia and intermittent hyperglycemia. She does not consistently follow dietary recommendations but regularly monitors her blood glucose.  She fell on June 1st, resulting in a left femur fracture, now healed with hardware. She is scheduled for a hip replacement after the first of the year and continues to experience hip pain, using a cane for mobility. She takes Aleve for pain management. Her last DEXA scan was in 2021 which showed osteopenia.   Her breast cancer was treated with chemotherapy and radiology, with a clip placed but no surgical removal. She continues annual mammograms and had a negative breast biopsy in March 2025. She is also taking anastrozole  1mg  daily and follows regularly with oncology.   She has a left bundle branch block, heart  failure, and sees a cardiologist biannually. Her cardiac medications include carvedilol  6.25mg  BID , Entresto  24-26mg  BID, and spironolactone  12.5mg  daily, which was recently decreased due to dehydration concerns. She denies shortness of breath and chest pain.   She experiences urinary symptoms, including a strong odor during urination, occasional incontinence, and irritation, but no dysuria, abdominal pain, or back pain. She was previously treated with antibiotics for a UTI and thinks she may have a yeast infection.   She has a history of GERD and is taking pantoprazole  40mg  daily. She due for EGD and colonoscopy and will have this in December.   She has a history of depression which is controlled with sertraline  100mg  daily and wellbutrin  XL 300mg  daily. She denies SI/HI.        12/19/2023    9:19 AM 12/18/2022    9:08 AM 02/20/2022    1:09 PM 12/13/2021    1:59 PM 07/07/2021    3:29 PM  Depression screen PHQ 2/9  Decreased Interest 0 3 0 0 0  Down, Depressed, Hopeless 1 1 0 0 0  PHQ - 2 Score 1 4 0 0 0  Altered sleeping 0 3     Tired, decreased energy 0 3     Change in appetite 0 2     Feeling bad or failure about yourself  0 0     Trouble concentrating 0 1     Moving slowly or fidgety/restless 0  1     Suicidal thoughts 0 0     PHQ-9 Score 1  14      Difficult doing work/chores Not difficult at all Somewhat difficult        Data saved with a previous flowsheet row definition    Past Medical History:  Diagnosis Date   Allergy AMOXICILLAN   Arthritis    Breast cancer (HCC) 03/05/2019   Cataract    Chest pain    Nuclear, adenosine ,  December, 2013, low risk nuclear scan with small, moderate in intensity, fixed anteroseptal defect. This is possibly related to an LBBB versus small prior infarct. 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   Type II or unspecified type diabetes mellitus without mention of complication, not stated as uncontrolled     Past Surgical History:  Procedure Laterality Date   APPENDECTOMY     During C-Section Birth   BREAST BIOPSY Left 10/2017   BREAST BIOPSY Right 03/2019   BREAST BIOPSY Right 08/26/2023   US  RT BREAST BX W LOC DEV 1ST LESION IMG BX SPEC US  GUIDE 08/26/2023 [facility] MAMMOGRAPHY   BREAST LUMPECTOMY Right 2020   BREAST LUMPECTOMY WITH RADIOACTIVE SEED AND SENTINEL LYMPH NODE BIOPSY Right 04/15/2019   Procedure: RIGHT BREAST PARTIAL MASTECTOMY WITH RADIOACTIVE SEED AND SENTINEL LYMPH NODE BIOPSY;  Surgeon: Vernetta Berg, MD;  Location: [facility] OR;  Service: General;  Laterality: Right;   CESAREAN SECTION     x2 ? w/appy   CHOLECYSTECTOMY     ESOPHAGOGASTRODUODENOSCOPY     FRACTURE SURGERY     HERNIA REPAIR     INTRAMEDULLARY (IM) NAIL INTERTROCHANTERIC Left 11/27/2023   Procedure: LEFT OPEN REDUCTION INTERNAL FIXATION (ORIF) INTERTROCHANTOR;  Surgeon: Jerri Kay HERO, MD;  Location: [facility] OR;  Service: Orthopedics;  Laterality: Left;   KNEE ARTHROSCOPY Right 11/2005   LIVER BIOPSY     PANCREATIC CYST EXCISION     PORT-A-CATH REMOVAL Left 04/25/2020   Procedure: REMOVAL PORT-A-CATH;  Surgeon: Vernetta Berg, MD;  Location: [facility];  Service: General;  Laterality: Left;   PORTACATH PLACEMENT Left 04/15/2019   Procedure: INSERTION PORT-A-CATH WITH ULTRASOUND;  Surgeon: Vernetta Berg, MD;  Location: [facility] OR;  Service: General;  Laterality: Left;   RIGHT/LEFT HEART CATH AND CORONARY ANGIOGRAPHY N/A 09/28/2019   Procedure: RIGHT/LEFT HEART CATH AND CORONARY ANGIOGRAPHY;  Surgeon: Rolan Ezra RAMAN, MD;  Location: [facility] INVASIVE CV LAB;  Service: Cardiovascular;  Laterality: N/A;   SPLENECTOMY     TONSILLECTOMY AND  ADENOIDECTOMY     TUBAL LIGATION  06/24/78    Family History  Problem Relation Age of Onset   Liver disease Mother    Dementia Mother    Diabetes Mother        borderline   Depression Mother    Coronary artery disease Father    Heart attack Father    Hypertension Father    Heart disease Father    Early death Father    Cancer Other        leukemia   Stroke Maternal Grandfather    Alcohol abuse Maternal Grandfather    Hyperlipidemia Brother  Amyloidosis   Cancer Brother    Hypertension Brother    Diabetes Maternal Grandmother    Cancer Paternal Uncle        unknown   Heart attack Paternal Grandmother    Heart attack Paternal Uncle    Hypertension Brother    Diabetes Daughter        borderline   Diabetes Sister    Heart disease Sister    Kidney disease Sister    Heart disease Brother    Cancer Paternal Uncle    Colon cancer Neg Hx    Esophageal cancer Neg Hx    Rectal cancer Neg Hx    Stomach cancer Neg Hx      Social History   Tobacco Use   Smoking status: Never   Smokeless tobacco: Never  Vaping Use   Vaping status: Never Used  Substance Use Topics   Alcohol use: Yes    Comment: very rarely a glass of wine   Drug use: No    Current Outpatient Medications on File Prior to Visit  Medication Sig Dispense Refill   anastrozole  (ARIMIDEX ) 1 MG tablet Take 1 tablet (1 mg total) by mouth daily. 90 tablet 3   aspirin EC 81 MG tablet Take 162 mg by mouth daily. Swallow whole.     B Complex-C (SUPER B COMPLEX PO) Take 1 tablet by mouth daily.     buPROPion  (WELLBUTRIN  XL) 300 MG 24 hr tablet Take 1 tablet (300 mg total) by mouth every morning. 90 tablet 1   carvedilol  (COREG ) 6.25 MG tablet Take 1 tablet (6.25 mg total) by mouth 2 (two) times daily with a meal. 180 tablet 3   Cholecalciferol  (VITAMIN D ) 50 MCG (2000 UT) tablet Take 2,000 Units by mouth daily.     Continuous Blood Gluc Receiver (FREESTYLE LIBRE 2 READER) DEVI 1 each by Does not apply route  daily. 6 each 3   Continuous Glucose Sensor (FREESTYLE LIBRE 2 SENSOR) MISC APPLY AS DIRECTED EVERY 14 DAYS 6 each 3   EASY COMFORT PEN NEEDLES 32G X 4 MM MISC USE AS DIRECTED EVERY DAY 100 each 3   ENTRESTO  24-26 MG TAKE 1 TABLET BY MOUTH TWICE DAILY 60 tablet 11   furosemide  (LASIX ) 40 MG tablet Take 1 tablet (40 mg total) by mouth as needed. For weight gain of 3ln in 24 hours or 5lb in a week     insulin  glargine (LANTUS ) 100 UNIT/ML injection Inject 12 Units into the skin daily after breakfast.     JARDIANCE  25 MG TABS tablet TAKE 1 TABLET BY MOUTH DAILY 90 tablet 3   meclizine  (ANTIVERT ) 25 MG tablet Take 25 mg by mouth 3 (three) times daily as needed for dizziness.     metFORMIN  (GLUCOPHAGE ) 1000 MG tablet TAKE 1 TABLET BY MOUTH TWICE DAILY WITH A MEAL 180 tablet 3   Naproxen Sodium (ALEVE PO) Take 1 tablet by mouth as needed.     pantoprazole  (PROTONIX ) 40 MG tablet Take 1 tablet (40 mg total) by mouth 2 (two) times daily. 180 tablet 0   Probiotic Product (PROBIOTIC ADVANCED PO) Take 1 tablet by mouth in the morning and at bedtime.     repaglinide  (PRANDIN ) 1 MG tablet Take 1 tablet (1 mg total) by mouth daily before supper. 90 tablet 3   rosuvastatin  (CRESTOR ) 10 MG tablet TAKE 1 TABLET BY MOUTH DAILY 30 tablet 11   sertraline  (ZOLOFT ) 100 MG tablet Take 1 tablet (100 mg total) by mouth daily.  90 tablet 3   spironolactone  (ALDACTONE ) 25 MG tablet Take 0.5 tablets (12.5 mg total) by mouth daily.     Vitamin E 180 MG CAPS Take 180 mg by mouth at bedtime.      zinc  gluconate 50 MG tablet Take 50 mg by mouth daily.     acetaminophen  (TYLENOL ) 500 MG tablet Take 1,000 mg by mouth every 6 (six) hours as needed for moderate pain. (Patient not taking: Reported on 04/30/2024)     No current facility-administered medications on file prior to visit.     Review of Systems  Constitutional:  Positive for fatigue. Negative for activity change.  HENT: Negative.    Eyes: Negative.   Respiratory:  Negative.    Cardiovascular: Negative.   Gastrointestinal: Negative.   Endocrine: Negative.   Genitourinary:  Positive for urgency. Negative for dysuria.       Vaginal itching   Musculoskeletal:  Positive for arthralgias (left and right hip).  Skin: Negative.   Neurological: Negative.   Psychiatric/Behavioral: Negative.          Objective:    BP 128/80 (BP Location: Right Arm, Patient Position: Sitting, Cuff Size: Normal)   Pulse 78   Temp (!) 97.3 F (36.3 C)   Ht 5' 2 (1.575 m)   Wt 133 lb 9.6 oz (60.6 kg)   SpO2 98%   BMI 24.44 kg/m   Wt Readings from Last 3 Encounters:  04/30/24 133 lb 9.6 oz (60.6 kg)  04/08/24 125 lb 9.6 oz (57 kg)  04/01/24 124 lb 12.8 oz (56.6 kg)    BP Readings from Last 3 Encounters:  04/30/24 128/80  04/08/24 116/65  04/08/24 108/64    Physical Exam Vitals and nursing note reviewed.  Constitutional:      General: She is not in acute distress.    Appearance: Normal appearance.  HENT:     Head: Normocephalic and atraumatic.     Right Ear: Tympanic membrane, ear canal and external ear normal.     Left Ear: Tympanic membrane, ear canal and external ear normal.  Eyes:     Conjunctiva/sclera: Conjunctivae normal.  Cardiovascular:     Rate and Rhythm: Normal rate and regular rhythm.     Pulses: Normal pulses.     Heart sounds: Normal heart sounds.  Pulmonary:     Effort: Pulmonary effort is normal.     Breath sounds: Normal breath sounds.  Abdominal:     Palpations: Abdomen is soft.     Tenderness: There is no abdominal tenderness.  Musculoskeletal:        General: Normal range of motion.     Cervical back: Normal range of motion and neck supple.     Right lower leg: No edema.     Left lower leg: No edema.     Comments: Generalized weakness. Ambulating with a cane  Lymphadenopathy:     Cervical: No cervical adenopathy.  Skin:    General: Skin is warm and dry.  Neurological:     General: No focal deficit present.     Mental  Status: She is alert and oriented to person, place, and time.     Cranial Nerves: No cranial nerve deficit.     Coordination: Coordination normal.     Gait: Gait normal.  Psychiatric:        Mood and Affect: Mood normal.        Behavior: Behavior normal.        Thought Content: Thought content normal.  Judgment: Judgment normal.        Assessment & Plan:   Problem List Items Addressed This Visit       Cardiovascular and Mediastinum   HTN (hypertension)   Chronic, stable. Continue Carvedilol  6.25mg  BID, spironolactone  12.5mg  daily, and Entresto  24-26mg  BID. Recent BMP, CBC reviewed.       CHB (complete heart block) (HCC)   History of CHB transient while getting port-a-cath placed. Continue recommendations from cardiology.       Chronic systolic heart failure (HCC)   Chronic, stable. Euvolemic on exam today. Most recent echo 03/2024 showed EF 55-60%. Heart failure with left bundle branch block is managed with carvedilol , Entresto , and spironolactone . She reports no recent chest pain or dyspnea. Continue carvedilol  6.25 mg twice daily, Entresto  24-26mg  twice daily, and spironolactone  12.5 mg daily. Follow up with the cardiologist as scheduled.        Digestive   GERD   Chronic, stable. Continue pantoprazole  40mg  daily. She is following with GI and scheduled for EGD and colonoscopy next month.         Endocrine   Type 2 diabetes mellitus (HCC)   Chronic, stable. Most recent A1c was 7.3%. Type 2 diabetes is managed with Trulicity  3mg  weekly, Jardiance  25mg  daily, metformin  1,000mg  BID, Prandin  1mg  daily, and lantus  12 units daily. Blood sugars are generally well-controlled with rare nocturnal hypoglycemia. She uses a CGM to monitor blood sugars. She is up to date on pneumonia vaccine, eye, and foot exam. Continue collaboration with endocrinology.         Musculoskeletal and Integument   Primary osteoarthritis of left hip   Osteoarthritis of the left hip with a previous  femur fracture and hardware placement is managed with Aleve. She is scheduled for hip replacement surgery. Continue Aleve as needed for pain and proceed with hip replacement surgery as scheduled. The medical clearance form for surgery is completed. She should stop trulicity  1 week prior to surgery. She will get cardiac clearance from her cardiologist.         Other   Hyperlipidemia with target LDL less than 100   Chronic, stable. Continue rosuvastatin  10mg  daily.       Chronic depression   Chronic, stable. Depression is managed with Wellbutrin  and Zoloft . Her mood is generally stable. Continue Wellbutrin  300mg  daily and Zoloft  100mg  daily as prescribed.       Abdominal wall hernia   Chronic, stable. Follow-up with any pain or concerns.       Malignant neoplasm of upper-outer quadrant of right breast in female, estrogen receptor positive (HCC)   Breast cancer was treated with chemotherapy and radiotherapy. Regular mammograms and a recent breast biopsy were negative. Continue anastrozole  1mg  daily as prescribed and annual mammograms. Continue collaboration and recommendations from oncology.       Relevant Medications   fluconazole  (DIFLUCAN ) 150 MG tablet   Cataract of both eyes   Cataracts are scheduled for surgery on December 8th for the right eye and December 29th for the left eye. Proceed with scheduled cataract surgeries.      Other Visit Diagnoses       Vaginal itching    -  Primary   Recent ABX for UTI. Will treat for yeast with diflucan  150mg  x1   Relevant Orders   POCT urinalysis dipstick (Completed)     Abnormal urinalysis       U/A showed trace leukocytes, will add on urine culture.   Relevant Orders   Urine Culture  Immunization due       Flu vaccine given today   Relevant Orders   Flu vaccine HIGH DOSE PF(Fluzone Trivalent) (Completed)       Follow up plan: Return in about 4 months (around 08/28/2024) for Diabetes.  Yasuko Lapage A Keryl Gholson

## 2024-04-30 NOTE — Assessment & Plan Note (Signed)
Chronic, stable. Continue rosuvastatin 10mg daily.  

## 2024-04-30 NOTE — Assessment & Plan Note (Signed)
 Chronic, stable. Depression is managed with Wellbutrin  and Zoloft . Her mood is generally stable. Continue Wellbutrin  300mg  daily and Zoloft  100mg  daily as prescribed.

## 2024-04-30 NOTE — Assessment & Plan Note (Signed)
 Breast cancer was treated with chemotherapy and radiotherapy. Regular mammograms and a recent breast biopsy were negative. Continue anastrozole  1mg  daily as prescribed and annual mammograms. Continue collaboration and recommendations from oncology.

## 2024-04-30 NOTE — Assessment & Plan Note (Signed)
 Osteoarthritis of the left hip with a previous femur fracture and hardware placement is managed with Aleve. She is scheduled for hip replacement surgery. Continue Aleve as needed for pain and proceed with hip replacement surgery as scheduled. The medical clearance form for surgery is completed. She should stop trulicity  1 week prior to surgery. She will get cardiac clearance from her cardiologist.

## 2024-04-30 NOTE — Assessment & Plan Note (Signed)
 Chronic, stable. Most recent A1c was 7.3%. Type 2 diabetes is managed with Trulicity  3mg  weekly, Jardiance  25mg  daily, metformin  1,000mg  BID, Prandin  1mg  daily, and lantus  12 units daily. Blood sugars are generally well-controlled with rare nocturnal hypoglycemia. She uses a CGM to monitor blood sugars. She is up to date on pneumonia vaccine, eye, and foot exam. Continue collaboration with endocrinology.

## 2024-04-30 NOTE — Assessment & Plan Note (Signed)
 Chronic, stable. Continue pantoprazole  40mg  daily. She is following with GI and scheduled for EGD and colonoscopy next month.

## 2024-05-11 ENCOUNTER — Inpatient Hospital Stay: Payer: PPO | Attending: Hematology and Oncology | Admitting: Hematology and Oncology

## 2024-05-11 VITALS — BP 130/54 | HR 87 | Temp 97.8°F | Resp 17 | Wt 129.4 lb

## 2024-05-11 DIAGNOSIS — Z170 Estrogen receptor positive status [ER+]: Secondary | ICD-10-CM | POA: Diagnosis not present

## 2024-05-11 DIAGNOSIS — Z79811 Long term (current) use of aromatase inhibitors: Secondary | ICD-10-CM | POA: Diagnosis not present

## 2024-05-11 DIAGNOSIS — C50411 Malignant neoplasm of upper-outer quadrant of right female breast: Secondary | ICD-10-CM | POA: Diagnosis not present

## 2024-05-11 DIAGNOSIS — Z1721 Progesterone receptor positive status: Secondary | ICD-10-CM | POA: Insufficient documentation

## 2024-05-11 DIAGNOSIS — Z1731 Human epidermal growth factor receptor 2 positive status: Secondary | ICD-10-CM | POA: Insufficient documentation

## 2024-05-11 MED ORDER — ANASTROZOLE 1 MG PO TABS: 1.0000 mg | ORAL_TABLET | Freq: Every day | ORAL | 3 refills | Status: AC

## 2024-05-11 NOTE — Assessment & Plan Note (Signed)
 Routine screening mammogram detected a 1.3cm mass in the right breast, 9 o'clock position, no axillary adenopathy. 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  T1c N0 stage Ia   Recommendations: 1. 04/15/19: Breast conserving surgery: Right lumpectomy Jan): IDC, grade 2, 1.3cm, with intermediate grade DCIS, clear margins, and 1 right axillary lymph node negative 2. Adj chemo with Taxol -Herceptin  foll by Herceptin  maintenance for 1 year started 05/13/2019, discontinued February 2021 for low ejection fraction 3. Adjuvant radiation therapy 08/20/2019-09/14/2019 4. Adjuvant antiestrogen therapy with anastrozole  1 mg daily started April 2021   Enrolled in SWOG 1714 trials.  ------------------------------------------------------------------------------------------------------------------------- Current treatment: Anastrozole  1 mg daily started April 2021 Echo EF 55 to 60% 04/15/2019 EF 30 to 35% on 07/20/2019 EF 45% 10/10/2020   Anastrozole  toxicities: Denies any hot flashes or arthralgias or myalgias..   Breast cancer surveillance: 1.  Breast exam 05/11/2024: Benign, slight tenderness in the right axilla 2. mammogram and ultrasound 08/19/2023 coarse calcifications typical of fat necrosis 1.3 cm: Biopsy: Benign fat necrosis.  Breast density category A   Bone density 05/31/2020: T score -1.1: (Mild osteopenia): Continue with calcium  and vitamin D .  She will need a new bone density test   Return to clinic in 1 year for follow-up

## 2024-05-11 NOTE — Progress Notes (Signed)
 Patient Care Team: Nedra Tinnie LABOR, NP as PCP - General (Internal Medicine) [name], Aleene PARAS, MD (Inactive) as PCP - Cardiology (Cardiology) Tyree Nanetta SAILOR, RN as Oncology Nurse Navigator Rolan Ezra RAMAN, MD as Consulting Physician (Cardiology) Roanna Blanch, Donika K, DO as Consulting Physician (Neurology)  DIAGNOSIS:  Encounter Diagnosis  Name Primary?   Malignant neoplasm of upper-outer quadrant of right breast in female, estrogen receptor positive (HCC) Yes    SUMMARY OF ONCOLOGIC HISTORY: Oncology History  Malignant neoplasm of upper-outer quadrant of right breast in female, estrogen receptor positive (HCC)  03/16/2019 Cancer Staging   Staging form: Breast, AJCC 8th Edition - Clinical stage from 03/16/2019: Stage IA (cT1c, cN0, cM0, G2, ER+, PR+, HER2+) - Signed by Crawford Morna Pickle, NP on 03/25/2019   03/25/2019 Initial Diagnosis   Routine screening mammogram detected a 1.3cm mass in the right breast, 9 o'clock position, no axillary adenopathy. 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    04/15/2019 Surgery   Right lumpectomy Jan): IDC, grade 2, 1.3cm, with intermediate grade DCIS, clear margins, and 1 right axillary lymph node negative   04/15/2019 Cancer Staging   Staging form: Breast, AJCC 8th Edition - Pathologic stage from 04/15/2019: Stage IA (pT1c, pN0, cM0, G2, ER+, PR+, HER2+) - Signed by Crawford Morna Pickle, NP on 04/29/2019   05/13/2019 -  Chemotherapy   Weekly Taxol  and Herceptin  x 12, then maintenance Herceptin  every 3 weeks x 1 year   08/20/2019 -  Radiation Therapy   Adjuvant radiation   09/2019 -  Anti-estrogen oral therapy   Anastrozole      CHIEF COMPLIANT: Follow-up on anastrozole  therapy  HISTORY OF PRESENT ILLNESS: History of Present Illness Margaret Norman is a 73 year old female with a history of breast cancer who presents for a follow-up visit.  She is five years  post-diagnosis of breast cancer and underwent surgery followed by nearly four years of anastrozole  therapy without significant side effects. In February, a mammogram followed by a biopsy showed fat necrosis. She experiences mild hot flashes, which have recently reoccurred, but they are not severe. No chest pain or discomfort.     ALLERGIES:  is allergic to invokana  [canagliflozin ] and amoxicillin .  MEDICATIONS:  Current Outpatient Medications  Medication Sig Dispense Refill   acetaminophen  (TYLENOL ) 500 MG tablet Take 1,000 mg by mouth every 6 (six) hours as needed for moderate pain.     anastrozole  (ARIMIDEX ) 1 MG tablet Take 1 tablet (1 mg total) by mouth daily. 90 tablet 3   aspirin EC 81 MG tablet Take 162 mg by mouth daily. Swallow whole.     B Complex-C (SUPER B COMPLEX PO) Take 1 tablet by mouth daily.     buPROPion  (WELLBUTRIN  XL) 300 MG 24 hr tablet Take 1 tablet (300 mg total) by mouth every morning. 90 tablet 1   carvedilol  (COREG ) 6.25 MG tablet Take 1 tablet (6.25 mg total) by mouth 2 (two) times daily with a meal. 180 tablet 3   Cholecalciferol  (VITAMIN D ) 50 MCG (2000 UT) tablet Take 2,000 Units by mouth daily.     Continuous Blood Gluc Receiver (FREESTYLE LIBRE 2 READER) DEVI 1 each by Does not apply route daily. 6 each 3   Continuous Glucose Sensor (FREESTYLE LIBRE 2 SENSOR) MISC APPLY AS DIRECTED EVERY 14 DAYS 6 each 3   Dulaglutide  (TRULICITY ) 3 MG/0.5ML SOAJ Inject 3 mg into the skin once a week. 6 mL  3   EASY COMFORT PEN NEEDLES 32G X 4 MM MISC USE AS DIRECTED EVERY DAY 100 each 3   ENTRESTO  24-26 MG TAKE 1 TABLET BY MOUTH TWICE DAILY 60 tablet 11   furosemide  (LASIX ) 40 MG tablet Take 1 tablet (40 mg total) by mouth as needed. For weight gain of 3ln in 24 hours or 5lb in a week     insulin  glargine (LANTUS ) 100 UNIT/ML injection Inject 12 Units into the skin daily after breakfast.     JARDIANCE  25 MG TABS tablet TAKE 1 TABLET BY MOUTH DAILY 90 tablet 3   meclizine   (ANTIVERT ) 25 MG tablet Take 25 mg by mouth 3 (three) times daily as needed for dizziness.     metFORMIN  (GLUCOPHAGE ) 1000 MG tablet TAKE 1 TABLET BY MOUTH TWICE DAILY WITH A MEAL 180 tablet 3   Naproxen Sodium (ALEVE PO) Take 1 tablet by mouth as needed.     pantoprazole  (PROTONIX ) 40 MG tablet Take 1 tablet (40 mg total) by mouth 2 (two) times daily. 180 tablet 0   Probiotic Product (PROBIOTIC ADVANCED PO) Take 1 tablet by mouth in the morning and at bedtime.     repaglinide  (PRANDIN ) 1 MG tablet Take 1 tablet (1 mg total) by mouth daily before supper. 90 tablet 3   rosuvastatin  (CRESTOR ) 10 MG tablet TAKE 1 TABLET BY MOUTH DAILY 30 tablet 11   sertraline  (ZOLOFT ) 100 MG tablet Take 1 tablet (100 mg total) by mouth daily. 90 tablet 3   spironolactone  (ALDACTONE ) 25 MG tablet Take 0.5 tablets (12.5 mg total) by mouth daily.     Vitamin E 180 MG CAPS Take 180 mg by mouth at bedtime.      zinc  gluconate 50 MG tablet Take 50 mg by mouth daily.     No current facility-administered medications for this visit.    PHYSICAL EXAMINATION: ECOG PERFORMANCE STATUS: 1 - Symptomatic but completely ambulatory  Vitals:   05/11/24 0950  BP: (!) 130/54  Pulse: 87  Resp: 17  Temp: 97.8 F (36.6 C)  SpO2: 100%   Filed Weights   05/11/24 0950  Weight: 129 lb 6.4 oz (58.7 kg)   Breast exam: No palpable lumps or nodules of concern  LABORATORY DATA:  I have reviewed the data as listed    Latest Ref Rng & Units 04/08/2024    7:40 PM 04/08/2024   10:14 AM 04/01/2024    1:53 PM  CMP  Glucose 70 - 99 mg/dL 830  858  894   BUN 8 - 23 mg/dL 32  33  23   Creatinine 0.44 - 1.00 mg/dL 8.86  8.71  8.90   Sodium 135 - 145 mmol/L 145  145  140   Potassium 3.5 - 5.1 mmol/L 4.2  4.5  4.5   Chloride 98 - 111 mmol/L 108  107  106   CO2 22 - 32 mmol/L 26  25  23    Calcium  8.9 - 10.3 mg/dL 9.1  9.6  9.6   Total Protein 6.5 - 8.1 g/dL 6.8  6.6    Total Bilirubin 0.0 - 1.2 mg/dL 0.2  0.3    Alkaline Phos 38 -  126 U/L 82  69    AST 15 - 41 U/L 13  11    ALT 0 - 44 U/L 13  8      Lab Results  Component Value Date   WBC 12.8 (H) 04/08/2024   HGB 10.5 (L) 04/08/2024   HCT  34.9 (L) 04/08/2024   MCV 92.1 04/08/2024   PLT 476 (H) 04/08/2024   NEUTROABS 5.8 04/08/2024    ASSESSMENT & PLAN:  Malignant neoplasm of upper-outer quadrant of right breast in female, estrogen receptor positive (HCC) Routine screening mammogram detected a 1.3cm mass in the right breast, 9 o'clock position, no axillary adenopathy. 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  T1c N0 stage Ia   Recommendations: 1. 04/15/19: Breast conserving surgery: Right lumpectomy Jan): IDC, grade 2, 1.3cm, with intermediate grade DCIS, clear margins, and 1 right axillary lymph node negative 2. Adj chemo with Taxol -Herceptin  foll by Herceptin  maintenance for 1 year started 05/13/2019, discontinued February 2021 for low ejection fraction 3. Adjuvant radiation therapy 08/20/2019-09/14/2019 4. Adjuvant antiestrogen therapy with anastrozole  1 mg daily started April 2021   Enrolled in SWOG 1714 trials.  ------------------------------------------------------------------------------------------------------------------------- Current treatment: Anastrozole  1 mg daily started April 2021 Echo EF 55 to 60% 04/15/2019 EF 30 to 35% on 07/20/2019 EF 45% 10/10/2020   Anastrozole  toxicities: Denies any hot flashes or arthralgias or myalgias..   Breast cancer surveillance: 1.  Breast exam 05/11/2024: Benign, slight tenderness in the right axilla 2. mammogram and ultrasound 08/19/2023 coarse calcifications typical of fat necrosis 1.3 cm: Biopsy: Benign fat necrosis.  Breast density category A   Bone density 05/31/2020: T score -1.1: (Mild osteopenia): Continue with calcium  and vitamin D .  She will need a new bone density test.  [facility]  She will call them and get  that appointment set up.   Return to clinic in 1 year for follow-up    No orders of the defined types were placed in this encounter.  The patient has a good understanding of the overall plan. she agrees with it. she will call with any problems that may develop before the next visit here.  I personally spent a total of 30 minutes in the care of the patient today including preparing to see the patient, getting/reviewing separately obtained history, performing a medically appropriate exam/evaluation, counseling and educating, placing orders, referring and communicating with other health care professionals, documenting clinical information in the EHR, independently interpreting results, communicating results, and coordinating care.   Viinay K Damichael Hofman, MD 05/11/24

## 2024-05-12 ENCOUNTER — Encounter: Payer: Self-pay | Admitting: Nurse Practitioner

## 2024-05-12 NOTE — Telephone Encounter (Signed)
 Requesting: PANTOPRAZOLE  SODIUM 40MG  DR TAB  Last Visit: 04/30/2024 Next Visit: 08/28/2024 Last Refill: 02/06/2024 by Leita Eliza Elbe, FNP   Please Advise

## 2024-05-13 ENCOUNTER — Telehealth: Payer: Self-pay | Admitting: Internal Medicine

## 2024-05-13 ENCOUNTER — Telehealth: Payer: Self-pay

## 2024-05-13 MED ORDER — PANTOPRAZOLE SODIUM 40 MG PO TBEC: 40.0000 mg | DELAYED_RELEASE_TABLET | Freq: Two times a day (BID) | ORAL | 1 refills | Status: AC

## 2024-05-13 NOTE — Telephone Encounter (Signed)
 Patient's daughter, Vina Ada, came in to office today and brought in a [facility] Patient Assistance Program Application to be completed.  The form is in Dr. Ara folder in the front office.

## 2024-05-13 NOTE — Telephone Encounter (Signed)
 Pharmacy Patient Advocate Encounter   Received notification from Patient Advice Request messages that prior authorization for Trulicity  is required/requested.   I see in the notes that patient has been receiving this medication through Patient assistance previously, and is currently applying again. Please advise if a PA is needed or if she will continue to receive Trulicity  through Patient assistance.

## 2024-05-13 NOTE — Telephone Encounter (Signed)
 Form has been obtained from the folder to be filled out.

## 2024-05-15 MED ORDER — TRULICITY 3 MG/0.5ML ~~LOC~~ SOAJ: 3.0000 mg | SUBCUTANEOUS | 3 refills | Status: AC

## 2024-05-15 NOTE — Addendum Note (Signed)
 Addended by: CLEOTILDE ROLIN RAMAN on: 05/15/2024 11:34 AM   Modules accepted: Orders

## 2024-05-15 NOTE — Telephone Encounter (Signed)
 Forms have been signed and faxed.

## 2024-05-20 ENCOUNTER — Ambulatory Visit: Admitting: Gastroenterology

## 2024-05-29 ENCOUNTER — Telehealth (HOSPITAL_COMMUNITY): Payer: Self-pay

## 2024-05-29 NOTE — Telephone Encounter (Signed)
  [facility]   Pre-operative Risk Assessment   HEARTCARE STAFF-IMPORTANT INSTRUCTIONS 1 Red and Blue Text will auto delete once note is signed or closed. 2 Press F2 to navigate through template.   3 On drop down lists, L click to select >> R click to activate next field 4 Reason for Visit format is IMPORTANT!!  See Directions on No. 2 below. 5 Please review chart to determine if there is already a clearance note open for this procedure!!  DO NOT duplicate if a note already exists!!    :1}      Request for Surgical Clearance    Procedure:  Left Total Hip Arthoplasty  Date of Surgery:  Clearance TBD                                 Surgeon:   Surgeon's Group or Practice Name:  [facility] Phone number:  [phone] Fax number:  [phone]   Type of Clearance Requested:   - Medical    Type of Anesthesia:  Not Indicated   Additional requests/questions:  Please fax a copy of Clearance to the surgeon's office.  Signed, Sema Stangler M Anniebell Bedore   05/29/2024, 1:25 PM   [facility] Harlene Gainer, FNP [facility] [address] [facility] [place] [address] [phone] (office) [phone] (fax)

## 2024-05-29 NOTE — Telephone Encounter (Signed)
Form faxed via epic.

## 2024-06-01 DIAGNOSIS — H2512 Age-related nuclear cataract, left eye: Secondary | ICD-10-CM | POA: Diagnosis not present

## 2024-06-01 DIAGNOSIS — H5371 Glare sensitivity: Secondary | ICD-10-CM | POA: Diagnosis not present

## 2024-06-01 HISTORY — PX: CATARACT EXTRACTION: SUR2

## 2024-06-02 DIAGNOSIS — H25041 Posterior subcapsular polar age-related cataract, right eye: Secondary | ICD-10-CM | POA: Diagnosis not present

## 2024-06-02 DIAGNOSIS — H2511 Age-related nuclear cataract, right eye: Secondary | ICD-10-CM | POA: Diagnosis not present

## 2024-06-02 DIAGNOSIS — H25011 Cortical age-related cataract, right eye: Secondary | ICD-10-CM | POA: Diagnosis not present

## 2024-06-10 ENCOUNTER — Encounter: Payer: Self-pay | Admitting: Gastroenterology

## 2024-06-10 ENCOUNTER — Ambulatory Visit (AMBULATORY_SURGERY_CENTER): Admitting: Gastroenterology

## 2024-06-10 VITALS — BP 129/60 | HR 75 | Temp 97.1°F | Resp 14 | Ht 62.0 in | Wt 125.0 lb

## 2024-06-10 DIAGNOSIS — D124 Benign neoplasm of descending colon: Secondary | ICD-10-CM

## 2024-06-10 DIAGNOSIS — R634 Abnormal weight loss: Secondary | ICD-10-CM

## 2024-06-10 DIAGNOSIS — K3189 Other diseases of stomach and duodenum: Secondary | ICD-10-CM | POA: Diagnosis not present

## 2024-06-10 DIAGNOSIS — R1084 Generalized abdominal pain: Secondary | ICD-10-CM

## 2024-06-10 DIAGNOSIS — K317 Polyp of stomach and duodenum: Secondary | ICD-10-CM | POA: Diagnosis not present

## 2024-06-10 DIAGNOSIS — R110 Nausea: Secondary | ICD-10-CM

## 2024-06-10 DIAGNOSIS — R194 Change in bowel habit: Secondary | ICD-10-CM

## 2024-06-10 DIAGNOSIS — K5730 Diverticulosis of large intestine without perforation or abscess without bleeding: Secondary | ICD-10-CM | POA: Diagnosis not present

## 2024-06-10 DIAGNOSIS — K6289 Other specified diseases of anus and rectum: Secondary | ICD-10-CM

## 2024-06-10 DIAGNOSIS — Q439 Congenital malformation of intestine, unspecified: Secondary | ICD-10-CM | POA: Diagnosis not present

## 2024-06-10 DIAGNOSIS — D649 Anemia, unspecified: Secondary | ICD-10-CM

## 2024-06-10 DIAGNOSIS — K529 Noninfective gastroenteritis and colitis, unspecified: Secondary | ICD-10-CM

## 2024-06-10 MED ORDER — SODIUM CHLORIDE 0.9 % IV SOLN: 500.0000 mL | Freq: Once | INTRAVENOUS | Status: DC

## 2024-06-10 NOTE — Progress Notes (Signed)
 Called to room to assist during endoscopic procedure.  Patient ID and intended procedure confirmed with present staff. Received instructions for my participation in the procedure from the performing physician.

## 2024-06-10 NOTE — Patient Instructions (Addendum)
 Handouts provided on polyps and diverticulosis.  Resume previous diet.  Continue present medications.  Await pathology results.  No repeat routine surveillance colonoscopy recommended based on current guidelines.  Office follow up- see appointment details.   YOU HAD AN ENDOSCOPIC PROCEDURE TODAY AT THE [facility]:   Refer to the procedure report that was given to you for any specific questions about what was found during the examination.  If the procedure report does not answer your questions, please call your gastroenterologist to clarify.  If you requested that your care partner not be given the details of your procedure findings, then the procedure report has been included in a sealed envelope for you to review at your convenience later.  YOU SHOULD EXPECT: Some feelings of bloating in the abdomen. Passage of more gas than usual.  Walking can help get rid of the air that was put into your GI tract during the procedure and reduce the bloating. If you had a lower endoscopy (such as a colonoscopy or flexible sigmoidoscopy) you may notice spotting of blood in your stool or on the toilet paper. If you underwent a bowel prep for your procedure, you may not have a normal bowel movement for a few days.  Please Note:  You might notice some irritation and congestion in your nose or some drainage.  This is from the oxygen used during your procedure.  There is no need for concern and it should clear up in a day or so.  SYMPTOMS TO REPORT IMMEDIATELY:  Following lower endoscopy (colonoscopy or flexible sigmoidoscopy):  Excessive amounts of blood in the stool  Significant tenderness or worsening of abdominal pains  Swelling of the abdomen that is new, acute  Fever of 100F or higher  Following upper endoscopy (EGD)  Vomiting of blood or coffee ground material  New chest pain or pain under the shoulder blades  Painful or persistently difficult swallowing  New shortness of breath  Fever of  100F or higher  Black, tarry-looking stools  For urgent or emergent issues, a gastroenterologist can be reached at any hour by calling [phone]. Do not use MyChart messaging for urgent concerns.    DIET:  We do recommend a small meal at first, but then you may proceed to your regular diet.  Drink plenty of fluids but you should avoid alcoholic beverages for 24 hours.  ACTIVITY:  You should plan to take it easy for the rest of today and you should NOT DRIVE or use heavy machinery until tomorrow (because of the sedation medicines used during the test).    FOLLOW UP: Our staff will call the number listed on your records the next business day following your procedure.  We will call around 7:15- 8:00 am to check on you and address any questions or concerns that you may have regarding the information given to you following your procedure. If we do not reach you, we will leave a message.     If any biopsies were taken you will be contacted by phone or by letter within the next 1-3 weeks.  Please call us  at [phone] if you have not heard about the biopsies in 3 weeks.    SIGNATURES/CONFIDENTIALITY: You and/or your care partner have signed paperwork which will be entered into your electronic medical record.  These signatures attest to the fact that that the information above on your After Visit Summary has been reviewed and is understood.  Full responsibility of the confidentiality of this discharge  information lies with you and/or your care-partner.

## 2024-06-10 NOTE — Op Note (Signed)
  [facility] Patient Name: Margaret Norman Procedure Date: 06/10/2024 10:45 AM MRN: [id number] Endoscopist: Victory L. Legrand , MD, [id number] Age: 73 Referring MD:  Date of Birth: 1951-05-05 Gender: Female Account #: [id number] Procedure:                Colonoscopy Indications:              Generalized abdominal pain, Chronic diarrhea,                            Weight loss, normocytic anemia with mildly                            decreased iron saturation                           Clinical details and most recent GI office note                           Negative C. difficile PCR October 2025                           CTAP August 2025 without acute findings. Prior                            distal pancreatectomy for cyst resection,                            pancreatic head calcifications seen on that scan Medicines:                Monitored Anesthesia Care Procedure:                Pre-Anesthesia Assessment:                           - Prior to the procedure, a History and Physical                            was performed, and patient medications and                            allergies were reviewed. The patient's tolerance of                            previous anesthesia was also reviewed. The risks                            and benefits of the procedure and the sedation                            options and risks were discussed with the patient.                            All questions were answered, and informed consent  was obtained. Prior Anticoagulants: The patient has                            taken no anticoagulant or antiplatelet agents. ASA                            Grade Assessment: II - A patient with mild systemic                            disease. After reviewing the risks and benefits,                            the patient was deemed in satisfactory condition to                            undergo the procedure.                            After obtaining informed consent, the colonoscope                            was passed under direct vision. Throughout the                            procedure, the patient's blood pressure, pulse, and                            oxygen saturations were monitored continuously. The                            CF [id number] #[id number] was introduced through the anus                            and advanced to the the cecum, identified by                            appendiceal orifice and ileocecal valve. (TI could                            not be intubated due to scope looping) The                            colonoscopy was performed with difficulty due to                            multiple diverticula in the colon, a redundant                            colon and a tortuous colon. Successful completion                            of the procedure was aided by using manual  pressure, straightening and shortening the scope to                            obtain bowel loop reduction and lavage. The patient                            tolerated the procedure well. The quality of the                            bowel preparation was good. The ileocecal valve,                            appendiceal orifice, and rectum were photographed. Scope In: 11:18:37 AM Scope Out: 11:39:23 AM Scope Withdrawal Time: 0 hours 11 minutes 9 seconds  Total Procedure Duration: 0 hours 20 minutes 46 seconds  Findings:                 The perianal and digital rectal examinations were                            normal. (Other than decreased resting sphincter                            tone)                           Repeat examination of right colon under NBI                            performed.                           Many diverticula were found in the entire colon.                           Normal mucosa was found in the entire colon.                            Biopsies for histology were  taken with a cold                            forceps from the right colon and left colon for                            evaluation of microscopic colitis.                           A 4 mm polyp was found in the descending colon. The                            polyp was flat. The polyp was removed with a cold                            snare. Resection and retrieval were complete.  The exam was otherwise without abnormality on                            direct and retroflexion views. Complications:            No immediate complications. Estimated Blood Loss:     Estimated blood loss was minimal. Impression:               - Diverticulosis in the entire examined colon.                           - Normal mucosa in the entire examined colon.                            Biopsied.                           - One 4 mm polyp in the descending colon, removed                            with a cold snare. Resected and retrieved.                           - The examination was otherwise normal on direct                            and retroflexion views.                           No source of weight loss seen on this exam Recommendation:           - Patient has a contact number available for                            emergencies. The signs and symptoms of potential                            delayed complications were discussed with the                            patient. Return to normal activities tomorrow.                            Written discharge instructions were provided to the                            patient.                           - Resume previous diet.                           - Continue present medications.                           - Await pathology results.                           -  No repeat routine surveillance colonoscopy                            recommended based on current guidelines.                           - See the other procedure note for  documentation of                            additional recommendations.                           - Arrange clinic follow-up with PA Mallory). If all                            biopsies normal, obtain fecal elastase and consider                            empiric therapy for possible SIBO Victory L. Legrand, MD 06/10/2024 11:53:38 AM This report has been signed electronically.

## 2024-06-10 NOTE — Progress Notes (Signed)
 To pacu, VSS. Report to [email or web address]

## 2024-06-10 NOTE — Progress Notes (Signed)
 History and Physical:  This patient presents for endoscopic testing for: Encounter Diagnoses  Name Primary?   Change in bowel habits Yes   Unintentional weight loss    Generalized abdominal pain    Chronic diarrhea    Nausea without vomiting    Normocytic anemia     73 yo woman here for evaluation of multiple GI symptoms as outlined in APP office consult note dated 04/08/24 without significant clinical changes since then.  Patient is otherwise without complaints or active issues today.   Past Medical History: Past Medical History:  Diagnosis Date   Allergy AMOXICILLAN   Arthritis    Breast cancer (HCC) 03/05/2019   Cataract    Chest pain    Nuclear, adenosine ,  December, 2013, low risk nuclear scan with small, moderate in intensity, fixed anteroseptal defect. This is possibly related to an LBBB versus small prior infarct. 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   Type II or unspecified type diabetes mellitus without mention of complication, not stated as uncontrolled      Past Surgical History: Past Surgical History:  Procedure Laterality Date   APPENDECTOMY     During C-Section Birth   BREAST BIOPSY Left 10/2017   BREAST BIOPSY Right 03/2019   BREAST BIOPSY Right 08/26/2023   US  RT BREAST BX W LOC DEV 1ST LESION IMG BX SPEC US  GUIDE 08/26/2023 [facility]   BREAST LUMPECTOMY Right 2020   BREAST LUMPECTOMY WITH RADIOACTIVE SEED AND SENTINEL LYMPH NODE BIOPSY Right 04/15/2019   Procedure: RIGHT BREAST PARTIAL MASTECTOMY WITH RADIOACTIVE SEED AND SENTINEL LYMPH NODE BIOPSY;  Surgeon: Vernetta Berg, MD;  Location: [facility] OR;  Service: General;  Laterality: Right;    CATARACT EXTRACTION Left 06/01/2024   CESAREAN SECTION     x2 ? w/appy   CHOLECYSTECTOMY     ESOPHAGOGASTRODUODENOSCOPY     FRACTURE SURGERY     HERNIA REPAIR     INTRAMEDULLARY (IM) NAIL INTERTROCHANTERIC Left 11/27/2023   Procedure: LEFT OPEN REDUCTION INTERNAL FIXATION (ORIF) INTERTROCHANTOR;  Surgeon: Jerri Kay HERO, MD;  Location: [facility] OR;  Service: Orthopedics;  Laterality: Left;   KNEE ARTHROSCOPY Right 11/2005   LIVER BIOPSY     PANCREATIC CYST EXCISION     PORT-A-CATH REMOVAL Left 04/25/2020   Procedure: REMOVAL PORT-A-CATH;  Surgeon: Vernetta Berg, MD;  Location: [facility];  Service: General;  Laterality: Left;   PORTACATH PLACEMENT Left 04/15/2019   Procedure: INSERTION PORT-A-CATH WITH ULTRASOUND;  Surgeon: Vernetta Berg, MD;  Location: [facility] OR;  Service: General;  Laterality: Left;   RIGHT/LEFT HEART CATH AND CORONARY ANGIOGRAPHY N/A 09/28/2019   Procedure: RIGHT/LEFT HEART CATH AND CORONARY ANGIOGRAPHY;  Surgeon: Rolan Ezra RAMAN, MD;  Location: [facility];  Service: Cardiovascular;  Laterality: N/A;   SPLENECTOMY     TONSILLECTOMY AND ADENOIDECTOMY     TUBAL LIGATION  06/24/78    Allergies: Allergies[1]  Outpatient Meds: Current Outpatient Medications  Medication Sig Dispense Refill   acetaminophen  (TYLENOL ) 500 MG tablet Take 1,000 mg by mouth every 6 (six) hours as needed for moderate pain.     anastrozole  (ARIMIDEX ) 1 MG tablet  Take 1 tablet (1 mg total) by mouth daily. 90 tablet 3   aspirin EC 81 MG tablet Take 162 mg by mouth daily. Swallow whole.     B Complex-C (SUPER B COMPLEX PO) Take 1 tablet by mouth daily.     BESIVANCE 0.6 % SUSP Place 1 drop into the right eye 4 (four) times daily.     Bromfenac Sodium 0.07 % SOLN Apply 1 drop to eye 2 (two) times daily.     buPROPion  (WELLBUTRIN  XL) 300 MG 24 hr tablet Take 1 tablet (300 mg total) by mouth every morning. 90 tablet 1   carvedilol  (COREG ) 6.25 MG tablet Take 1 tablet (6.25 mg  total) by mouth 2 (two) times daily with a meal. 180 tablet 3   Cholecalciferol  (VITAMIN D ) 50 MCG (2000 UT) tablet Take 2,000 Units by mouth daily.     Continuous Blood Gluc Receiver (FREESTYLE LIBRE 2 READER) DEVI 1 each by Does not apply route daily. 6 each 3   Continuous Glucose Sensor (FREESTYLE LIBRE 2 SENSOR) MISC APPLY AS DIRECTED EVERY 14 DAYS 6 each 3   DUREZOL 0.05 % EMUL Place 1 drop into the right eye 4 (four) times daily.     EASY COMFORT PEN NEEDLES 32G X 4 MM MISC USE AS DIRECTED EVERY DAY 100 each 3   ENTRESTO  24-26 MG TAKE 1 TABLET BY MOUTH TWICE DAILY 60 tablet 11   furosemide  (LASIX ) 40 MG tablet Take 1 tablet (40 mg total) by mouth as needed. For weight gain of 3ln in 24 hours or 5lb in a week     gatifloxacin (ZYMAXID) 0.5 % SOLN 1 drop 4 (four) times daily.     insulin  glargine (LANTUS ) 100 UNIT/ML injection Inject 12 Units into the skin daily after breakfast.     JARDIANCE  25 MG TABS tablet TAKE 1 TABLET BY MOUTH DAILY 90 tablet 3   metFORMIN  (GLUCOPHAGE ) 1000 MG tablet TAKE 1 TABLET BY MOUTH TWICE DAILY WITH A MEAL 180 tablet 3   Naproxen Sodium (ALEVE PO) Take 1 tablet by mouth as needed.     pantoprazole  (PROTONIX ) 40 MG tablet Take 1 tablet (40 mg total) by mouth 2 (two) times daily. 180 tablet 1   prednisoLONE acetate (PRED FORTE) 1 % ophthalmic suspension 1 drop 4 (four) times daily.     Probiotic Product (PROBIOTIC ADVANCED PO) Take 1 tablet by mouth in the morning and at bedtime.     repaglinide  (PRANDIN ) 1 MG tablet Take 1 tablet (1 mg total) by mouth daily before supper. 90 tablet 3   rosuvastatin  (CRESTOR ) 10 MG tablet TAKE 1 TABLET BY MOUTH DAILY 30 tablet 11   sertraline  (ZOLOFT ) 100 MG tablet Take 1 tablet (100 mg total) by mouth daily. 90 tablet 3   spironolactone  (ALDACTONE ) 25 MG tablet Take 0.5 tablets (12.5 mg total) by mouth daily.     Vitamin E 180 MG CAPS Take 180 mg by mouth at bedtime.      zinc  gluconate 50 MG tablet Take 50 mg by mouth daily.      Dulaglutide  (TRULICITY ) 3 MG/0.5ML SOAJ Inject 3 mg into the skin once a week. 6 mL 3   meclizine  (ANTIVERT ) 25 MG tablet Take 25 mg by mouth 3 (three) times daily as needed for dizziness.     Current Facility-Administered Medications  Medication Dose Route Frequency Provider Last Rate Last Admin   0.9 %  sodium chloride  infusion  500 mL Intravenous Once Danis, Violet Seabury L III,  MD          ___________________________________________________________________ Objective   Exam:  BP 120/77   Pulse 95   Temp (!) 97.1 F (36.2 C)   Ht 5' 2 (1.575 m)   Wt 125 lb (56.7 kg)   SpO2 97%   BMI 22.86 kg/m   CV: regular , S1/S2 Resp: clear to auscultation bilaterally, normal RR and effort noted GI: soft, no tenderness, with active bowel sounds.   Assessment: Encounter Diagnoses  Name Primary?   Change in bowel habits Yes   Unintentional weight loss    Generalized abdominal pain    Chronic diarrhea    Nausea without vomiting    Normocytic anemia      Plan: Colonoscopy EGD  The benefits and risks of the planned procedure(s) were described in detail with the patient or (when appropriate) their health care proxy.  Risks were outlined as including, but not limited to, bleeding, infection, perforation, adverse medication reaction leading to cardiac or pulmonary decompensation, pancreatitis (if ERCP).  The limitation of incomplete mucosal visualization was also discussed.  No guarantees or warranties were given.  The patient was provided an opportunity to ask questions and all were answered. The patient agreed with the plan.   The patient is appropriate for an endoscopic procedure in the ambulatory setting.   - Victory Brand, MD        [1]  Allergies Allergen Reactions   Invokana  [Canagliflozin ] Other (See Comments)    UTI's   Amoxicillin  Other (See Comments)    Sore tongue Did it involve swelling of the face/tongue/throat, SOB, or low BP? No Did it involve sudden or severe  rash/hives, skin peeling, or any reaction on the inside of your mouth or nose? No Did you need to seek medical attention at a hospital or doctor's office? No When did it last happen?      5-10 years ago If all above answers are NO, may proceed with cephalosporin use.

## 2024-06-10 NOTE — Op Note (Signed)
 [facility] Patient Name: Margaret Norman Procedure Date: 06/10/2024 10:55 AM MRN: [id number] Endoscopist: Victory L. Legrand , MD, [id number] Age: 73 Referring MD:  Date of Birth: [birth year] Gender: Female Account #: [id number] Procedure:                Upper GI endoscopy Indications:              Generalized abdominal pain, Diarrhea, Nausea with                            vomiting, Weight loss, normocytic anemia (with mild                            iron deficiency)                           Previous distal pancreatectomy for cyst resection.                            Calcifications and pancreatic head on August 2025                            CTAP Medicines:                Monitored Anesthesia Care Procedure:                Pre-Anesthesia Assessment:                           - Prior to the procedure, a History and Physical                            was performed, and patient medications and                            allergies were reviewed. The patient's tolerance of                            previous anesthesia was also reviewed. The risks                            and benefits of the procedure and the sedation                            options and risks were discussed with the patient.                            All questions were answered, and informed consent                            was obtained. Prior Anticoagulants: The patient has                            taken no anticoagulant or antiplatelet agents. ASA  Grade Assessment: II - A patient with mild systemic                            disease. After reviewing the risks and benefits,                            the patient was deemed in satisfactory condition to                            undergo the procedure.                           After obtaining informed consent, the endoscope was                            passed under direct vision. Throughout the                             procedure, the patient's blood pressure, pulse, and                            oxygen saturations were monitored continuously. The                            Olympus Scope [id number] was introduced through the                            mouth, and advanced to the second part of duodenum.                            The upper GI endoscopy was accomplished without                            difficulty. The patient tolerated the procedure                            well. Scope In: Scope Out: Findings:                 The larynx was normal.                           The esophagus was normal.                           Patchy mildly erythematous mucosa was found in the                            gastric antrum. Several biopsies were obtained in                            the gastric body and in the gastric antrum with                            cold forceps for histology.  Multiple sessile fundic gland polyps were found in                            the gastric fundus and in the gastric body.                           The exam of the stomach was otherwise normal.                           The cardia and gastric fundus were normal on                            retroflexion.                           Normal mucosa was found in the entire duodenum.                            Biopsies for histology were taken with a cold                            forceps for evaluation of celiac disease. Complications:            No immediate complications. Estimated Blood Loss:     Estimated blood loss was minimal. Impression:               - Normal larynx.                           - Normal esophagus.                           - Erythematous mucosa in the antrum.                           - Multiple fundic gland polyps.                           - Normal mucosa was found in the entire examined                            duodenum. Biopsied.                           - Several biopsies  were obtained in the gastric                            body and in the gastric antrum.                           No source of weight loss on this exam. See                            colonoscopy report Recommendation:           - Patient has a contact number available for  emergencies. The signs and symptoms of potential                            delayed complications were discussed with the                            patient. Return to normal activities tomorrow.                            Written discharge instructions were provided to the                            patient.                           - Resume previous diet.                           - Continue present medications.                           - Await pathology results.                           - See the other procedure note for documentation of                            additional recommendations. Jahnaya Branscome L. Legrand, MD 06/10/2024 11:48:13 AM This report has been signed electronically.

## 2024-06-11 ENCOUNTER — Telehealth: Payer: Self-pay

## 2024-06-11 NOTE — Telephone Encounter (Signed)
°  Follow up Call-     06/10/2024   10:17 AM  Call back number  Post procedure Call Back phone  # [phone]  Permission to leave phone message Yes     Patient questions:  Do you have a fever, pain , or abdominal swelling? No. Pain Score  0 *  Have you tolerated food without any problems? Yes.    Have you been able to return to your normal activities? Yes.    Do you have any questions about your discharge instructions: Diet   No. Medications  No. Follow up visit  No.  Do you have questions or concerns about your Care? No.  Actions: * If pain score is 4 or above: No action needed, pain <4.

## 2024-06-11 NOTE — Telephone Encounter (Signed)
 Patient aware

## 2024-06-11 NOTE — Telephone Encounter (Signed)
-----   Message from Victory Brand, MD sent at 06/11/2024  8:55 AM EST ----- Regarding: RE: f/u appt If nothing available sooner with any of the POD C apps or me, then it will have to be next available as it stands.  Patient may want to have it be farther out after her hip surgery for better ambulation.  - H. Danis ----- Message ----- From: Concha Almarie LABOR, LPN Sent: [id number]   3:10 PM EST To: Victory LITTIE Brand DOUGLAS, MD Subject: FW: f/u appt                                   See Jessica's not about the appointment. Do you want her to be seen sooner? ----- Message ----- From: Domenic Harlene LITTIE, RN Sent: 06/10/2024  12:19 PM EST To: Odetta LITTIE Curly, RN; Almarie LABOR Concha, LPN; # Subject: f/u appt                                       Hello,   Dr. Brand wanted this pt to be seen by Camie Furbish, PA after biopsies for a stool test and possible treatment for SIBO. I scheduled her for 07/17/24, which was the first available. She informed me she is having hip replacement surgery on 07/10/24. Just messaging to see if Camie has any earlier appointments to be seen prior to surgery or if appointment can be postponed until she is healed after surgery.   Thanks,   [facility]

## 2024-06-12 LAB — SURGICAL PATHOLOGY

## 2024-06-16 ENCOUNTER — Other Ambulatory Visit: Payer: Self-pay

## 2024-06-16 ENCOUNTER — Ambulatory Visit: Payer: Self-pay | Admitting: Gastroenterology

## 2024-06-16 DIAGNOSIS — R110 Nausea: Secondary | ICD-10-CM

## 2024-06-16 DIAGNOSIS — R634 Abnormal weight loss: Secondary | ICD-10-CM

## 2024-06-16 DIAGNOSIS — R1084 Generalized abdominal pain: Secondary | ICD-10-CM

## 2024-06-16 DIAGNOSIS — K529 Noninfective gastroenteritis and colitis, unspecified: Secondary | ICD-10-CM

## 2024-06-16 DIAGNOSIS — R194 Change in bowel habit: Secondary | ICD-10-CM

## 2024-06-24 ENCOUNTER — Other Ambulatory Visit: Payer: Self-pay | Admitting: Physician Assistant

## 2024-06-24 MED ORDER — DOCUSATE SODIUM 100 MG PO CAPS: 100.0000 mg | ORAL_CAPSULE | Freq: Every day | ORAL | 2 refills | Status: AC | PRN

## 2024-06-24 MED ORDER — ONDANSETRON HCL 4 MG PO TABS: 4.0000 mg | ORAL_TABLET | Freq: Three times a day (TID) | ORAL | 0 refills | Status: AC | PRN

## 2024-06-24 MED ORDER — METHOCARBAMOL 500 MG PO TABS: 500.0000 mg | ORAL_TABLET | Freq: Two times a day (BID) | ORAL | 2 refills | Status: AC | PRN

## 2024-06-24 MED ORDER — HYDROCODONE-ACETAMINOPHEN 5-325 MG PO TABS: 1.0000 | ORAL_TABLET | Freq: Three times a day (TID) | ORAL | 0 refills | Status: DC | PRN

## 2024-06-24 MED ORDER — ASPIRIN 81 MG PO CHEW: 81.0000 mg | CHEWABLE_TABLET | Freq: Two times a day (BID) | ORAL | 0 refills | Status: AC

## 2024-06-30 ENCOUNTER — Encounter: Payer: Self-pay | Admitting: Internal Medicine

## 2024-06-30 ENCOUNTER — Ambulatory Visit: Admitting: Internal Medicine

## 2024-06-30 VITALS — BP 120/60 | HR 89 | Ht 62.0 in | Wt 125.4 lb

## 2024-06-30 DIAGNOSIS — E785 Hyperlipidemia, unspecified: Secondary | ICD-10-CM | POA: Diagnosis not present

## 2024-06-30 DIAGNOSIS — E042 Nontoxic multinodular goiter: Secondary | ICD-10-CM

## 2024-06-30 DIAGNOSIS — E118 Type 2 diabetes mellitus with unspecified complications: Secondary | ICD-10-CM | POA: Diagnosis not present

## 2024-06-30 DIAGNOSIS — Z794 Long term (current) use of insulin: Secondary | ICD-10-CM | POA: Diagnosis not present

## 2024-06-30 LAB — POCT GLYCOSYLATED HEMOGLOBIN (HGB A1C): Hemoglobin A1C: 7.1 % — AB (ref 4.0–5.6)

## 2024-06-30 MED ORDER — FREESTYLE LIBRE 3 PLUS SENSOR MISC: 1.0000 | 3 refills | Status: AC

## 2024-06-30 NOTE — Patient Instructions (Addendum)
 SURGICAL WAITING ROOM VISITATION Patients having surgery or a procedure may have no more than 2 support people in the waiting area - these visitors may rotate in the visitor waiting room.   Due to an increase in RSV and influenza rates and associated hospitalizations, children ages 18 and under may not visit patients in [facility] hospitals. If the patient needs to stay at the hospital during part of their recovery, the visitor guidelines for inpatient rooms apply.  PRE-OP VISITATION  Pre-op nurse will coordinate an appropriate time for 1 support person to accompany the patient in pre-op.  This support person may not rotate.  This visitor will be contacted when the time is appropriate for the visitor to come back in the pre-op area.  Please refer to the [facility] website for the visitor guidelines for Inpatients (after your surgery is over and you are in a regular room).  You are not required to quarantine at this time prior to your surgery. However, you must do this: Hand Hygiene often Do NOT share personal items Notify your provider if you are in close contact with someone who has COVID or you develop fever 100.4 or greater, new onset of sneezing, cough, sore throat, shortness of breath or body aches.  If you test positive for Covid or have been in contact with anyone that has tested positive in the last 10 days please notify you surgeon.    Your procedure is scheduled on:  07/10/24  Report to [facility] Main Entrance: [place] entrance where the [employer] is available.   Report to admitting at: 5:15 AM  Call this number if you have any questions or problems the morning of surgery [phone]  FOLLOW ANY ADDITIONAL PRE OP INSTRUCTIONS YOU RECEIVED FROM YOUR SURGEON'S OFFICE!!!  Do not eat food after Midnight the night prior to your surgery/procedure.  After Midnight you may have the following liquids until : 4:30 AM DAY OF SURGERY  Clear Liquid Diet Water  Black  Coffee (sugar ok, NO MILK/CREAM OR CREAMERS)  Tea (sugar ok, NO MILK/CREAM OR CREAMERS) regular and decaf                             Plain Jell-O  with no fruit (NO RED)                                           Fruit ices (not with fruit pulp, NO RED)                                     Popsicles (NO RED)                                                                  Juice: NO CITRUS JUICES: only apple, WHITE grape, WHITE cranberry Sports drinks like Gatorade or Powerade (NO RED)   The day of surgery:  Drink ONE (1) Pre-Surgery Clear G2 at : 4:30 AM the morning of surgery. Drink in one sitting. Do not sip.  This drink was  given to you during your hospital pre-op appointment visit. Nothing else to drink after completing the Pre-Surgery Clear Ensure or G2 : No candy, chewing gum or throat lozenges.    Oral Hygiene is also important to reduce your risk of infection.        Remember - BRUSH YOUR TEETH THE MORNING OF SURGERY WITH YOUR REGULAR TOOTHPASTE  Do NOT smoke after Midnight the night before surgery.  STOP TAKING all Vitamins, Herbs and supplements 1 week before your surgery.   Take ONLY these medicines the morning of surgery with A SIP OF WATER : bupropion ,sertraline ,anastrozole ,carvedilol ,pantoprazole .Meclizine ,ondansetron  as needed.Use eye drops as usual  How to Manage Your Diabetes Before and After Surgery  Why is it important to control my blood sugar before and after surgery? Improving blood sugar levels before and after surgery helps healing and can limit problems. A way of improving blood sugar control is eating a healthy diet by:  Eating less sugar and carbohydrates  Increasing activity/exercise  Talking with your doctor about reaching your blood sugar goals High blood sugars (greater than 180 mg/dL) can raise your risk of infections and slow your recovery, so you will need to focus on controlling your diabetes during the weeks before surgery. Make sure that the doctor  who takes care of your diabetes knows about your planned surgery including the date and location.  How do I manage my blood sugar before surgery? Check your blood sugar at least 4 times a day, starting 2 days before surgery, to make sure that the level is not too high or low. Check your blood sugar the morning of your surgery when you wake up and every 2 hours until you get to the Short Stay unit. If your blood sugar is less than 70 mg/dL, you will need to treat for low blood sugar: Do not take insulin . Treat a low blood sugar (less than 70 mg/dL) with  cup of clear juice (cranberry or apple), 4 glucose tablets, OR glucose gel. Recheck blood sugar in 15 minutes after treatment (to make sure it is greater than 70 mg/dL). If your blood sugar is not greater than 70 mg/dL on recheck, call [phone] for further instructions. Report your blood sugar to the short stay nurse when you get to Short Stay.  If you are admitted to the hospital after surgery: Your blood sugar will be checked by the staff and you will probably be given insulin  after surgery (instead of oral diabetes medicines) to make sure you have good blood sugar levels. The goal for blood sugar control after surgery is 80-180 mg/dL.   WHAT DO I DO ABOUT MY DIABETES MEDICATION?  Do not take oral diabetes medicines (pills) the morning of surgery.      THE MORNING OF SURGERY, take ONLY half of lantus  insulin  dose(7.5 units).  DO NOT TAKE THE FOLLOWING 7 DAYS PRIOR TO SURGERY: Ozempic, Wegovy, Rybelsus (Semaglutide), Byetta  (exenatide ), Bydureon  (exenatide  ER), Victoza , Saxenda  (liraglutide ), or Trulicity  (dulaglutide ) Mounjaro (Tirzepatide) Adlyxin (Lixisenatide), Polyethylene Glycol Loxenatide.HOLD Trulicity  after: 07/02/24  If your CBG is greater than 220 mg/dL, you may take  of your sliding scale  (correction) dose of insulin .     If You have been diagnosed with Sleep Apnea - Bring CPAP mask and tubing day of surgery. We will  provide you with a CPAP machine on the day of your surgery.                   You may not have any metal  on your body including hair pins, jewelry, and body piercing  Do not wear make-up, lotions, powders, perfumes / cologne, or deodorant  Do not wear nail polish including gel and S&S, artificial / acrylic nails, or any other type of covering on natural nails including finger and toenails. If you have artificial nails, gel coating, etc., that needs to be removed by a nail salon, Please have this removed prior to surgery. Not doing so may mean that your surgery could be cancelled or delayed if the Surgeon or anesthesia staff feels like they are unable to monitor you safely.   Do not shave 48 hours prior to surgery to avoid nicks in your skin which may contribute to postoperative infections.   Men may shave face and neck.  Contacts, Hearing Aids, dentures or bridgework may not be worn into surgery. DENTURES WILL BE REMOVED PRIOR TO SURGERY PLEASE DO NOT APPLY Poly grip OR ADHESIVES!!!  You may bring a small overnight bag with you on the day of surgery, only pack items that are not valuable. [facility] IS NOT RESPONSIBLE   FOR VALUABLES THAT ARE LOST OR STOLEN.   Patients discharged on the day of surgery will not be allowed to drive home.  Someone NEEDS to stay with you for the first 24 hours after anesthesia.  Do not bring your home medications to the hospital. The Pharmacy will dispense medications listed on your medication list to you during your admission in the Hospital.  Special Instructions: Bring a copy of your healthcare power of attorney and living will documents the day of surgery, if you wish to have them scanned into your [facility] Records- EPIC  Please read over the following fact sheets you were given: IF YOU HAVE QUESTIONS ABOUT YOUR PRE-OP INSTRUCTIONS, PLEASE CALL [phone]  PATIENT SIGNATURE_________________________________  NURSE  SIGNATURE__________________________________  ________________________________________________________________________  Pre-operative 4 CHG Bath Instructions  DYNA-Hex 4 Chlorhexidine  Gluconate 4% Solution Antiseptic 4 fl. oz   You can play a key role in reducing the risk of infection after surgery. Your skin needs to be as free of germs as possible. You can reduce the number of germs on your skin by washing with CHG (chlorhexidine  gluconate) soap before surgery. CHG is an antiseptic soap that kills germs and continues to kill germs even after washing.   DO NOT use if you have an allergy to chlorhexidine /CHG or antibacterial soaps. If your skin becomes reddened or irritated, stop using the CHG and notify one of our RNs at   Please shower with the CHG soap starting 4 days before surgery using the following schedule:     Please keep in mind the following:  DO NOT shave, including legs and underarms, starting the day of your first shower.   You may shave your face at any point before/day of surgery.  Place clean sheets on your bed the day you start using CHG soap. Use a clean washcloth (not used since being washed) for each shower. DO NOT sleep with pets once you start using the CHG.  CHG Shower Instructions:  If you choose to wash your hair and private area, wash first with your normal shampoo/soap.  After you use shampoo/soap, rinse your hair and body thoroughly to remove shampoo/soap residue.  Turn the water  OFF and apply about 3 tablespoons (45 ml) of CHG soap to a CLEAN washcloth.  Apply CHG soap ONLY FROM YOUR NECK DOWN TO YOUR TOES (washing for 3-5 minutes)  DO NOT use CHG soap on face,  private areas, open wounds, or sores.  Pay special attention to the area where your surgery is being performed.  If you are having back surgery, having someone wash your back for you may be helpful. Wait 2 minutes after CHG soap is applied, then you may rinse off the CHG soap.  Pat dry with a clean  towel  Put on clean clothes/pajamas   If you choose to wear lotion, please use ONLY the CHG-compatible lotions on the back of this paper.     Additional instructions for the day of surgery: DO NOT APPLY any lotions, deodorants, cologne, or perfumes.   Put on clean/comfortable clothes.  Brush your teeth.  Ask your nurse before applying any prescription medications to the skin.   CHG Compatible Lotions   Aveeno Moisturizing lotion  Cetaphil Moisturizing Cream  Cetaphil Moisturizing Lotion  Clairol Herbal Essence Moisturizing Lotion, Dry Skin  Clairol Herbal Essence Moisturizing Lotion, Extra Dry Skin  Clairol Herbal Essence Moisturizing Lotion, Normal Skin  Curel Age Defying Therapeutic Moisturizing Lotion with Alpha Hydroxy  Curel Extreme Care Body Lotion  Curel Soothing Hands Moisturizing Hand Lotion  Curel Therapeutic Moisturizing Cream, Fragrance-Free  Curel Therapeutic Moisturizing Lotion, Fragrance-Free  Curel Therapeutic Moisturizing Lotion, Original Formula  Eucerin Daily Replenishing Lotion  Eucerin Dry Skin Therapy Plus Alpha Hydroxy Crme  Eucerin Dry Skin Therapy Plus Alpha Hydroxy Lotion  Eucerin Original Crme  Eucerin Original Lotion  Eucerin Plus Crme Eucerin Plus Lotion  Eucerin TriLipid Replenishing Lotion  Keri Anti-Bacterial Hand Lotion  Keri Deep Conditioning Original Lotion Dry Skin Formula Softly Scented  Keri Deep Conditioning Original Lotion, Fragrance Free Sensitive Skin Formula  Keri Lotion Fast Absorbing Fragrance Free Sensitive Skin Formula  Keri Lotion Fast Absorbing Softly Scented Dry Skin Formula  Keri Original Lotion  Keri Skin Renewal Lotion Keri Silky Smooth Lotion  Keri Silky Smooth Sensitive Skin Lotion  Nivea Body Creamy Conditioning Oil  Nivea Body Extra Enriched Lotion  Nivea Body Original Lotion  Nivea Body Sheer Moisturizing Lotion Nivea Crme  Nivea Skin Firming Lotion  NutraDerm 30 Skin Lotion  NutraDerm Skin Lotion  NutraDerm  Therapeutic Skin Cream  NutraDerm Therapeutic Skin Lotion  ProShield Protective Hand Cream  Provon moisturizing lotion  Incentive Spirometer  An incentive spirometer is a tool that can help keep your lungs clear and active. This tool measures how well you are filling your lungs with each breath. Taking long deep breaths may help reverse or decrease the chance of developing breathing (pulmonary) problems (especially infection) following: A long period of time when you are unable to move or be active. BEFORE THE PROCEDURE  If the spirometer includes an indicator to show your best effort, your nurse or respiratory therapist will set it to a desired goal. If possible, sit up straight or lean slightly forward. Try not to slouch. Hold the incentive spirometer in an upright position. INSTRUCTIONS FOR USE  Sit on the edge of your bed if possible, or sit up as far as you can in bed or on a chair. Hold the incentive spirometer in an upright position. Breathe out normally. Place the mouthpiece in your mouth and seal your lips tightly around it. Breathe in slowly and as deeply as possible, raising the piston or the ball toward the top of the column. Hold your breath for 3-5 seconds or for as long as possible. Allow the piston or ball to fall to the bottom of the column. Remove the mouthpiece from your mouth and breathe out normally.  Rest for a few seconds and repeat Steps 1 through 7 at least 10 times every 1-2 hours when you are awake. Take your time and take a few normal breaths between deep breaths. The spirometer may include an indicator to show your best effort. Use the indicator as a goal to work toward during each repetition. After each set of 10 deep breaths, practice coughing to be sure your lungs are clear. If you have an incision (the cut made at the time of surgery), support your incision when coughing by placing a pillow or rolled up towels firmly against it. Once you are able to get out of  bed, walk around indoors and cough well. You may stop using the incentive spirometer when instructed by your caregiver.  RISKS AND COMPLICATIONS Take your time so you do not get dizzy or light-headed. If you are in pain, you may need to take or ask for pain medication before doing incentive spirometry. It is harder to take a deep breath if you are having pain. AFTER USE Rest and breathe slowly and easily. It can be helpful to keep track of a log of your progress. Your caregiver can provide you with a simple table to help with this. If you are using the spirometer at home, follow these instructions: SEEK MEDICAL CARE IF:  You are having difficultly using the spirometer. You have trouble using the spirometer as often as instructed. Your pain medication is not giving enough relief while using the spirometer. You develop fever of 100.5 F (38.1 C) or higher. SEEK IMMEDIATE MEDICAL CARE IF:  You cough up bloody sputum that had not been present before. You develop fever of 102 F (38.9 C) or greater. You develop worsening pain at or near the incision site. MAKE SURE YOU:  Understand these instructions. Will watch your condition. Will get help right away if you are not doing well or get worse. Document Released: 10/22/2006 Document Revised: 09/03/2011 Document Reviewed: 12/23/2006 [facility] Patient Information 2014 [place], [place].   ________________________________________________________________________

## 2024-06-30 NOTE — Patient Instructions (Addendum)
 Please continue: - Metformin  1000 mg 2x a day - Repaglinide  (Prandin ) 1 mg before dinner - Jardiance  25 mg daily before breakfast - Trulicity  3 mg weekly - Lantus  12 units in a.m.   Please return in 4 months.

## 2024-06-30 NOTE — Progress Notes (Signed)
 Patient ID: Margaret Norman, female   DOB: [birth year], 74 y.o.   MRN: [id number]   HPI: Margaret Norman is a 74 y.o.-year-old female, returning for follow-up for DM2, dx in 2008, insulin -dependent since 2016, uncontrolled, without long term complications.  Last visit  4 months ago.  Interim history:  No increased urination, nausea, chest pain.  She  has dizziness and lightheadedness and a history of vertigo.  She is walking with a cane.   She had a left hip closed fracture 11/24/2023 - tripped over a blanket. She had to have ORIF. She will also need a hip replacement - scheduled for 07/10/2024. She had B cataract sx since last OV.  She has some blurry vision.  Reviewed HbA1c levels: Lab Results  Component Value Date   HGBA1C 7.3 (A) 02/25/2024   HGBA1C 6.5 (A) 10/23/2023   HGBA1C 6.9 (A) 06/24/2023   HGBA1C 6.4 (A) 02/12/2023   HGBA1C 6.4 (A) 09/26/2022   HGBA1C 6.5 (A) 05/23/2022   HGBA1C 6.2 (A) 01/17/2022   HGBA1C 6.7 (A) 09/15/2021   HGBA1C 7.1 (A) 05/17/2021   HGBA1C 7.0 (A) 01/10/2021   HGBA1C 6.9 (A) 09/20/2020   HGBA1C 7.0 (A) 05/18/2020   HGBA1C 7.4 (A) 01/14/2020   HGBA1C 6.6 (A) 09/17/2019   HGBA1C 8.6 (H) 04/10/2019   HGBA1C 7.4 (A) 04/01/2019   HGBA1C 7.1 (A) 12/17/2018   HGBA1C 6.8 (A) 01/10/2018   HGBA1C 8.7 08/13/2017   HGBA1C 7.5 (H) 05/02/2017  03/19/2017: HbA1c calculated from fructosamine is 6.7%! 12/31/2016: HbA1c calculated from fructosamine: 6.9%!  Pt is on: - Metformin  1000 mg 2x a day with meals >> 2000 mg with dinner - Jardiance  25 mg daily before breakfast (PAP) - Prandin   1 mg before larger dinners -started 01/2023-actually taking it after breakfast >> before dinner - Trulicity  1.5 >> 3 mg weekly (PAP) - Lantus  30 >> 26 >> 22 >> 18 >> 12-14 >> taking 15 >> 12 units at night >> moved to a.m. Stopped Actos  when started trulicity  back. She was on Trulicity  but became expensive >> changed to Actos . She was on Invokana  >> recurrent UTIs.  Pt checks  her sugars >4x a day:  Previously:  Previously:  Lowest sugar was 50s >> 69; she has hypoglycemia awareness in the 70s. Highest sugar was 280 >> 250 >> 300 (jellybeans).  Glucometer: OneTouch Verio  - + CKD CKD, last BUN/creatinine:  Lab Results  Component Value Date   BUN 32 (H) 04/08/2024   BUN 33 (H) 04/08/2024   CREATININE 1.13 (H) 04/08/2024   CREATININE 1.28 (H) 04/08/2024   Lab Results  Component Value Date   MICRALBCREAT 29 10/23/2023   MICRALBCREAT 4.7 11/15/2008   MICRALBCREAT 10.7 07/12/2008   MICRALBCREAT 8.8 01/16/2007   MICRALBCREAT 8.8 07/17/2006  On Entresto .  -+ HL; last set of lipids: Lab Results  Component Value Date   CHOL 89 04/01/2024   HDL 40 (L) 04/01/2024   LDLCALC 33 04/01/2024   LDLDIRECT 137.9 02/04/2013   TRIG 78 04/01/2024   CHOLHDL 2.2 04/01/2024  On Crestor  10.  - last eye exam was 09/11/2023: No DR, + cataract OS - sx 06/01/2024, + cataract OD - Sx 06/22/2024.  Dr. Ginnie Pinal. Latest eye exam: [facility].  -+ numbness and tingling in her toes - after chemotx.  She has restless leg syndrome.  Last foot exam 10/23/2023.  Pt has FH of DM in MGM.  Multinodular goiter:   -Detected on palpation in 2021  Thyroid  ultrasound (  01/26/2020): Parenchymal Echotexture: Moderately heterogenous Isthmus: 0.3 cm Right lobe: 6.2 cm x 2.1 cm x 2.2 cm Left lobe: 7.7 cm x 3.2 cm x 2.8 cm _________________________________________________________   Nodule # 1: Location: Right; Superior Maximum size: 0.9 cm; Other 2 dimensions: 0.7 cm x 0.7 cm Composition: spongiform (0) Spongiform nodule does not meet criteria for surveillance or biopsy  _________________________________________________________   Nodule # 2: Location: Right; Mid Maximum size: 1.0 cm; Other 2 dimensions: 0.9 cm x 0.6 cm Composition: spongiform (0) Spongiform nodule does not meet criteria for surveillance or biopsy  _________________________________________________________    Nodule # 3:  Location: Right; Mid Maximum size: 1.2 cm; Other 2 dimensions: 1.1 cm x 0.7 cm Composition: spongiform (0) Spongiform nodule does not meet criteria for surveillance or biopsy _________________________________________________________   Nodule # 4:  Location: Right; Inferior Maximum size: 1.0 cm; Other 2 dimensions: 0.8 cm x 0.8 cm Composition: spongiform (0) Spongiform nodule does not meet criteria for surveillance or biopsy  ________________________________________________________   Nodule # 5:  Location: Right; Inferior Maximum size: 1.5 cm; Other 2 dimensions: 1.4 cm x 0.8 cm Composition: solid/almost completely solid (2) Echogenicity: isoechoic (1) Nodule meets criteria for surveillance  _________________________________________________________   Nodule # 6: Location: Left; Superior Maximum size: 1.2 cm; Other 2 dimensions: 1.1 cm x 0.9 cm  Composition: cannot determine (2) Echogenicity: isoechoic (1) Echogenic foci: macrocalcifications (1)  Nodule meets criteria for surveillance  _______________________________________________________   Nodule # 7: Location: Left; Inferior Maximum size: 4.7 cm; Other 2 dimensions: 4.7 cm x 2.8 cm Composition: mixed cystic and solid (1) Echogenicity: isoechoic (1) Echogenic foci: macrocalcifications (1) Nodule meets criteria for biopsy  ______________________________________________________   No adenopathy   IMPRESSION: Multinodular thyroid .   Left inferior thyroid  nodule (labeled 7, TR , 4.7 cm) meets criteria for biopsy, as designated by the newly established ACR TI-RADS criteria, and referral for biopsy is recommended.   Right inferior thyroid  nodule (labeled 5, 1.5 cm, TR 3) and the left superior thyroid  nodule (labeled 6, 1.2 cm, TR 4) both meet criteria for surveillance, as designated by the newly established ACR TI-RADS criteria. Surveillance ultrasound study recommended to be performed annually up to 5  years.   FNA left inferior thyroid  nodule (02/16/2020): Benign  Thyroid  U/S (02/07/2021): Parenchymal Echotexture: Markedly heterogenous Isthmus: 0.6 cm Right lobe: 7.4 cm x 2.0 cm x 2.5 cm Left lobe: 8.4 cm x 3.2 cm x 4.3 cm _________________________________________________________   Estimated total number of nodules >/= 1 cm: 6-10  _________________________________________________________   Nodule labeled 1 superior right thyroid , slightly smaller than previous and spongiform. Nodule does not meet criteria for surveillance or biopsy   Nodule labeled 2, right thyroid , TR 2 with cystic components measures less than 1 cm. Nodule does not meet criteria for surveillance or biopsy   Nodule labeled 3, mid right thyroid . Spongiform characteristics measuring 1.3 cm. Nodule does not meet criteria for surveillance or biopsy.   Nodule labeled 4, mid right thyroid , 9 mm, spongiform characteristics. Nodule does not meet criteria for surveillance or biopsy.   Nodule labeled 5, 0.98 cm, spongiform characteristics and does not meet criteria for surveillance or biopsy.   Nodule 6, right lower thyroid , 0.96 cm. Nodule has spongiform characteristics and does not meet criteria for surveillance or biopsy.   Nodule 7, right lower thyroid , smaller than previous, now 1.0 cm. Previous nodule was labeled 5, 1.5 cm. Nodule has clearly spongiform characteristics on the current ultrasound, and has decreased in size below 1  cm. Nodule no longer meets criteria for surveillance.   Nodule labeled 8 on the left, upper left thyroid , 9 mm. Nodule has internal cystic change on the current, TR 2 characteristics, and has decreased in size below 1 cm. Nodule no longer meets criteria for continued surveillance.   The nodules at the left inferior thyroid  labeled 9, 10, 11, appear to all be part of the previous 4.7 cm nodule which was previously biopsied 02/16/2020, and are doubtful to represent  independent nodules given the available images and the comparison.   No adenopathy   Recommendations follow those established by the new ACR TI-RADS criteria (J Am Coll Radiol 2017;14:587-595).   IMPRESSION: Enlarged multinodular thyroid , as above.   Nodule inferior left thyroid  has been previously biopsied, as above. Assuming benign result, no further specific follow-up would be indicated.  Pt denies: - feeling nodules in neck - hoarseness - dysphagia - just with pills (chronic) - choking  Latest TSH was normal Lab Results  Component Value Date   TSH 0.37 04/08/2024   She sees Cardiology - Dr. Rolan. She also has a history of breast cancer, s/p chemoradiation therapy. She fractured humerus in 02/2020 after tripping >> this is healing.  She had a DXA scan >> normal.  She is on Arimidex .  She had lightheadedness and dizziness >> she was in the emergency room with vertigo 06/30/2021.  ROS: + See HPI  I reviewed pt's medications, allergies, PMH, social hx, family hx, and changes were documented in the history of present illness. Otherwise, unchanged from my initial visit note.  Past Medical History:  Diagnosis Date   Allergy AMOXICILLAN   Arthritis    Breast cancer (HCC) 03/05/2019   Cataract    Chest pain    Nuclear, adenosine ,  December, 2013, low risk nuclear scan with small, moderate in intensity, fixed anteroseptal defect. This is possibly related to an LBBB versus small prior infarct. 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   Type II or unspecified type diabetes mellitus without mention of complication, not stated as uncontrolled    Past Surgical History:  Procedure  Laterality Date   APPENDECTOMY     During C-Section Birth   BREAST BIOPSY Left 10/2017   BREAST BIOPSY Right 03/2019   BREAST BIOPSY Right 08/26/2023   US  RT BREAST BX W LOC DEV 1ST LESION IMG BX SPEC US  GUIDE 08/26/2023 [facility]   BREAST LUMPECTOMY Right 2020   BREAST LUMPECTOMY WITH RADIOACTIVE SEED AND SENTINEL LYMPH NODE BIOPSY Right 04/15/2019   Procedure: RIGHT BREAST PARTIAL MASTECTOMY WITH RADIOACTIVE SEED AND SENTINEL LYMPH NODE BIOPSY;  Surgeon: Vernetta Berg, MD;  Location: [facility] OR;  Service: General;  Laterality: Right;   CATARACT EXTRACTION Left 06/01/2024   CESAREAN SECTION     x2 ? w/appy   CHOLECYSTECTOMY     ESOPHAGOGASTRODUODENOSCOPY     FRACTURE SURGERY     HERNIA REPAIR     INTRAMEDULLARY (IM) NAIL INTERTROCHANTERIC Left 11/27/2023   Procedure: LEFT OPEN REDUCTION INTERNAL FIXATION (ORIF) INTERTROCHANTOR;  Surgeon: Jerri Kay HERO, MD;  Location: [facility] OR;  Service: Orthopedics;  Laterality: Left;   KNEE ARTHROSCOPY  Right 11/2005   LIVER BIOPSY     PANCREATIC CYST EXCISION     PORT-A-CATH REMOVAL Left 04/25/2020   Procedure: REMOVAL PORT-A-CATH;  Surgeon: Vernetta Berg, MD;  Location: [facility];  Service: General;  Laterality: Left;   PORTACATH PLACEMENT Left 04/15/2019   Procedure: INSERTION PORT-A-CATH WITH ULTRASOUND;  Surgeon: Vernetta Berg, MD;  Location: [facility] OR;  Service: General;  Laterality: Left;   RIGHT/LEFT HEART CATH AND CORONARY ANGIOGRAPHY N/A 09/28/2019   Procedure: RIGHT/LEFT HEART CATH AND CORONARY ANGIOGRAPHY;  Surgeon: Rolan Ezra RAMAN, MD;  Location: [facility] INVASIVE CV LAB;  Service: Cardiovascular;  Laterality: N/A;   SPLENECTOMY     TONSILLECTOMY AND ADENOIDECTOMY     TUBAL LIGATION  06/24/78   Social History   Socioeconomic History   Marital status: Married    Spouse name: Korinna Tat   Number of children: 2   Years of education: Not on file   Highest education level: 12th grade  Occupational History    Occupation: CSR    Employer: [employer]   Occupation: retired  Tobacco Use   Smoking status: Never   Smokeless tobacco: Never  Vaping Use   Vaping status: Never Used  Substance and Sexual Activity   Alcohol use: Yes    Comment: very rarely a glass of wine   Drug use: No   Sexual activity: Not Currently    Birth control/protection: Post-menopausal    Comment: Tubes tied  Other Topics Concern   Not on file  Social History Narrative   Regular Exercise -  NO   Are you right handed or left handed? Right Handed    Are you currently employed ? No    What is your current occupation? Retired   Do you live at home alone? No   Who lives with you? Husband, Daughter, and Darden   What type of home do you live in: 1 story or 2 story? Lives in a one story home       Social Drivers of Health   Tobacco Use: Low Risk (06/10/2024)   Patient History    Smoking Tobacco Use: Never    Smokeless Tobacco Use: Never    Passive Exposure: Not on file  Financial Resource Strain: Medium Risk (04/27/2024)   Overall Financial Resource Strain (CARDIA)    Difficulty of Paying Living Expenses: Somewhat hard  Food Insecurity: Food Insecurity Present (04/27/2024)   Epic    Worried About Programme Researcher, Broadcasting/film/video in the Last Year: Sometimes true    Ran Out of Food in the Last Year: Sometimes true  Transportation Needs: No Transportation Needs (04/27/2024)   Epic    Lack of Transportation (Medical): No    Lack of Transportation (Non-Medical): No  Physical Activity: Inactive (04/27/2024)   Exercise Vital Sign    Days of Exercise per Week: 0 days    Minutes of Exercise per Session: Not on file  Stress: Stress Concern Present (04/27/2024)   [employer] of Occupational Health - Occupational Stress Questionnaire    Feeling of Stress: To some extent  Social Connections: Moderately Isolated (04/27/2024)   Social Connection and Isolation Panel    Frequency of Communication with Friends and Family: More  than three times a week    Frequency of Social Gatherings with Friends and Family: Three times a week    Attends Religious Services: Patient declined    Active Member of Clubs or Organizations: No    Attends Banker Meetings: Not  on file    Marital Status: Married  Catering Manager Violence: Not At Risk (12/19/2023)   Epic    Fear of Current or Ex-Partner: No    Emotionally Abused: No    Physically Abused: No    Sexually Abused: No  Depression (PHQ2-9): Low Risk (12/19/2023)   Depression (PHQ2-9)    PHQ-2 Score: 1  Alcohol Screen: Low Risk (12/19/2023)   Alcohol Screen    Last Alcohol Screening Score (AUDIT): 0  Housing: Unknown (04/27/2024)   Epic    Unable to Pay for Housing in the Last Year: No    Number of Times Moved in the Last Year: Not on file    Homeless in the Last Year: No  Utilities: Not At Risk (12/19/2023)   Epic    Threatened with loss of utilities: No  Health Literacy: Adequate Health Literacy (12/19/2023)   B1300 Health Literacy    Frequency of need for help with medical instructions: Never   Current Outpatient Medications on File Prior to Visit  Medication Sig Dispense Refill   aspirin  (ASPIRIN  81) 81 MG chewable tablet Chew 1 tablet (81 mg total) by mouth 2 (two) times daily. To be taken after surgery to prevent blood clots 84 tablet 0   docusate sodium  (COLACE) 100 MG capsule Take 1 capsule (100 mg total) by mouth daily as needed. 30 capsule 2   HYDROcodone -acetaminophen  (NORCO/VICODIN) 5-325 MG tablet Take 1 tablet by mouth 3 (three) times daily as needed. To be taken after surgery 21 tablet 0   methocarbamol  (ROBAXIN ) 500 MG tablet Take 1 tablet (500 mg total) by mouth 2 (two) times daily as needed. 20 tablet 2   ondansetron  (ZOFRAN ) 4 MG tablet Take 1 tablet (4 mg total) by mouth every 8 (eight) hours as needed for nausea or vomiting. 40 tablet 0   anastrozole  (ARIMIDEX ) 1 MG tablet Take 1 tablet (1 mg total) by mouth daily. 90 tablet 3   aspirin  EC  81 MG tablet Take 162 mg by mouth daily. Swallow whole.     B Complex-C (SUPER B COMPLEX PO) Take 1 tablet by mouth daily.     BESIVANCE 0.6 % SUSP Place 1 drop into the right eye 4 (four) times daily.     Bromfenac Sodium 0.07 % SOLN Place 1 drop into the right eye 2 (two) times daily.     buPROPion  (WELLBUTRIN  XL) 300 MG 24 hr tablet Take 1 tablet (300 mg total) by mouth every morning. 90 tablet 1   carvedilol  (COREG ) 6.25 MG tablet Take 1 tablet (6.25 mg total) by mouth 2 (two) times daily with a meal. 180 tablet 3   Cholecalciferol  (VITAMIN D ) 50 MCG (2000 UT) tablet Take 2,000 Units by mouth daily.     Continuous Blood Gluc Receiver (FREESTYLE LIBRE 2 READER) DEVI 1 each by Does not apply route daily. 6 each 3   Continuous Glucose Sensor (FREESTYLE LIBRE 2 SENSOR) MISC APPLY AS DIRECTED EVERY 14 DAYS 6 each 3   Dulaglutide  (TRULICITY ) 3 MG/0.5ML SOAJ Inject 3 mg into the skin once a week. 6 mL 3   DUREZOL 0.05 % EMUL Place 1 drop into the right eye 4 (four) times daily.     EASY COMFORT PEN NEEDLES 32G X 4 MM MISC USE AS DIRECTED EVERY DAY 100 each 3   ENTRESTO  24-26 MG TAKE 1 TABLET BY MOUTH TWICE DAILY 60 tablet 11   furosemide  (LASIX ) 40 MG tablet Take 1 tablet (40 mg total) by mouth  as needed. For weight gain of 3ln in 24 hours or 5lb in a week     gatifloxacin (ZYMAXID) 0.5 % SOLN Place 1 drop into the right eye 4 (four) times daily.     insulin  glargine (LANTUS ) 100 UNIT/ML injection Inject 15 Units into the skin daily after breakfast.     JARDIANCE  25 MG TABS tablet TAKE 1 TABLET BY MOUTH DAILY 90 tablet 3   meclizine  (ANTIVERT ) 25 MG tablet Take 25 mg by mouth 3 (three) times daily as needed for dizziness.     metFORMIN  (GLUCOPHAGE ) 1000 MG tablet TAKE 1 TABLET BY MOUTH TWICE DAILY WITH A MEAL 180 tablet 3   naproxen sodium (ALEVE) 220 MG tablet Take 220 mg by mouth daily as needed (pain).     pantoprazole  (PROTONIX ) 40 MG tablet Take 1 tablet (40 mg total) by mouth 2 (two) times  daily. 180 tablet 1   prednisoLONE acetate (PRED FORTE) 1 % ophthalmic suspension Place 1 drop into the right eye 4 (four) times daily.     Probiotic Product (PROBIOTIC ADVANCED PO) Take 1 tablet by mouth in the morning and at bedtime.     repaglinide  (PRANDIN ) 1 MG tablet Take 1 tablet (1 mg total) by mouth daily before supper. 90 tablet 3   rosuvastatin  (CRESTOR ) 10 MG tablet TAKE 1 TABLET BY MOUTH DAILY 30 tablet 11   sertraline  (ZOLOFT ) 100 MG tablet Take 1 tablet (100 mg total) by mouth daily. 90 tablet 3   spironolactone  (ALDACTONE ) 25 MG tablet Take 0.5 tablets (12.5 mg total) by mouth daily.     Vitamin E 180 MG CAPS Take 180 mg by mouth at bedtime.      zinc  gluconate 50 MG tablet Take 50 mg by mouth daily.     No current facility-administered medications on file prior to visit.   Allergies  Allergen Reactions   Invokana  [Canagliflozin ] Other (See Comments)    UTI's   Amoxicillin  Other (See Comments)    Sore tongue Did it involve swelling of the face/tongue/throat, SOB, or low BP? No Did it involve sudden or severe rash/hives, skin peeling, or any reaction on the inside of your mouth or nose? No Did you need to seek medical attention at a hospital or doctor's office? No When did it last happen?      5-10 years ago If all above answers are NO, may proceed with cephalosporin use.    Family History  Problem Relation Age of Onset   Liver disease Mother    Dementia Mother    Diabetes Mother        borderline   Depression Mother    Coronary artery disease Father    Heart attack Father    Hypertension Father    Heart disease Father    Early death Father    Cancer Other        leukemia   Stroke Maternal Grandfather    Alcohol abuse Maternal Grandfather    Hyperlipidemia Brother        Amyloidosis   Cancer Brother    Hypertension Brother    Diabetes Maternal Grandmother    Cancer Paternal Uncle        unknown   Heart attack Paternal Grandmother    Heart attack  Paternal Uncle    Hypertension Brother    Diabetes Daughter        borderline   Diabetes Sister    Heart disease Sister    Kidney disease Sister  Heart disease Brother    Cancer Paternal Uncle    Colon cancer Neg Hx    Esophageal cancer Neg Hx    Rectal cancer Neg Hx    Stomach cancer Neg Hx    PE: BP 120/60   Pulse 89   Ht 5' 2 (1.575 m)   Wt 125 lb 6.4 oz (56.9 kg)   SpO2 99%   BMI 22.94 kg/m   Wt Readings from Last 3 Encounters:  06/30/24 125 lb 6.4 oz (56.9 kg)  06/10/24 125 lb (56.7 kg)  05/11/24 129 lb 6.4 oz (58.7 kg)   Constitutional: Normal weight, in NAD, walks with a cane Eyes: EOMI, no exophthalmos ENT:  + full thyroid  on palpation (L>R lobe), no cervical lymphadenopathy Cardiovascular: RRR, No MRG Respiratory: CTA B Musculoskeletal: no deformities Skin: no rashes Neurological: No tremors with outstretched hands  ASSESSMENT: 1. DM2, insulin -dependent, uncontrolled, without long term complications, but with hyperglycemia  2. HL  3.  Multiple thyroid  nodules  PLAN:  1. Patient with longstanding, previously uncontrolled type 2 diabetes with improved control in the last 4 years.  However, at last visit, HbA1c increased from 6.5% to 7.3%.  At that time sugars were dropping abruptly overnight and then increasing slowly after lunch but with almost all of the blood sugars after dinner being above target.  Upon questioning, she had relaxed her diet and was snacking after dinner.  She was also not taking the Prandin  before this meal.  We discussed about taking Prandin  before dinner but to skip it if she forgot to take it before the meal, and not take it after the meal to prevent low blood sugars during the night.  I also recommended to move Lantus  from evening to morning to further prevent lows at night.  She did mention diarrhea but she did not feel that this was related to metformin , but rather to her probiotic.  She ran out of it and I advised her to stay off.  We  did discuss that if diarrhea continues, to split the metformin  dose in 2 or switch to the ER formulation. CGM interpretation: -At today's visit, we reviewed her CGM downloads: It appears that 64% of values are in target range (goal >70%), while 36% are higher than 180 (goal <25%), and 0% are lower than 70 (goal <4%).  The calculated average blood sugar is 164.  The projected HbA1c for the next 3 months (GMI) is 7.2%. -Reviewing the CGM trends, sugars appear to be improving overnight but then increasing significantly after lunch and less so after dinner.  However, reviewing the last 2 weeks of data, sugars were actually elevated until the end of the year, but they did improve after December 31 with much better values in the last week.  Therefore, for now, I did not suggest a change in regimen pending continues improvement in diet after the holidays, especially as her HbA1c is slightly lower than before (see below). - I suggested to:  Patient Instructions  Please continue: - Metformin  1000 mg 2x a day - Repaglinide  (Prandin ) 1 mg before dinner - Jardiance  25 mg daily before breakfast - Trulicity  3 mg weekly - Lantus  12 units in a.m.   Please return in 4 months.   - we checked her HbA1c: 7.1% (lower) - advised to check sugars at different times of the day - 4x a day, rotating check times - advised for yearly eye exams >> she is UTD - return to clinic in 4 months  2. HL - Latest lipid panel was reviewed from 3 months ago: Fractions at goal with exception of slightly low HDL: Lab Results  Component Value Date   CHOL 89 04/01/2024   HDL 40 (L) 04/01/2024   LDLCALC 33 04/01/2024   LDLDIRECT 137.9 02/04/2013   TRIG 78 04/01/2024   CHOLHDL 2.2 04/01/2024  -She continues Crestor  10 mg daily without side effects  3.  Thyroid  nodules -Her left thyroid  lobe was enlarged on exam so we checked a thyroid  ultrasound in 01/2020.  She had several nodules, of which the left inferior nodule meets the  criteria for biopsy.  A biopsy of this nodule was benign.  For the other 2 nodules, a follow-up was indicated in 1 year. She had another ultrasound in 01/2021 and the nodules appeared to be either stable or decreased in size no imaging follow-up was indicated. - No neck compression symptoms or masses felt on palpation of her neck today - TSH was normal: Lab Results  Component Value Date   TSH 0.37 04/08/2024  - Will continue to follow her clinically for now  Lela Fendt, MD PhD [facility]

## 2024-07-01 ENCOUNTER — Encounter (HOSPITAL_COMMUNITY): Payer: Self-pay

## 2024-07-01 ENCOUNTER — Other Ambulatory Visit: Payer: Self-pay

## 2024-07-01 ENCOUNTER — Encounter (HOSPITAL_COMMUNITY)
Admission: RE | Admit: 2024-07-01 | Discharge: 2024-07-01 | Disposition: A | Discharge status: Home / Self Care | Source: Ambulatory Visit | Attending: Orthopaedic Surgery | Admitting: Orthopaedic Surgery

## 2024-07-01 VITALS — BP 132/75 | HR 74 | Temp 98.3°F | Ht 62.0 in | Wt 120.0 lb

## 2024-07-01 DIAGNOSIS — Z9081 Acquired absence of spleen: Secondary | ICD-10-CM | POA: Diagnosis not present

## 2024-07-01 DIAGNOSIS — Z853 Personal history of malignant neoplasm of breast: Secondary | ICD-10-CM | POA: Diagnosis not present

## 2024-07-01 DIAGNOSIS — Z01818 Encounter for other preprocedural examination: Secondary | ICD-10-CM

## 2024-07-01 DIAGNOSIS — Z923 Personal history of irradiation: Secondary | ICD-10-CM | POA: Insufficient documentation

## 2024-07-01 DIAGNOSIS — E119 Type 2 diabetes mellitus without complications: Secondary | ICD-10-CM | POA: Diagnosis not present

## 2024-07-01 DIAGNOSIS — I10 Essential (primary) hypertension: Secondary | ICD-10-CM | POA: Diagnosis not present

## 2024-07-01 DIAGNOSIS — Z01812 Encounter for preprocedural laboratory examination: Secondary | ICD-10-CM | POA: Diagnosis present

## 2024-07-01 DIAGNOSIS — Z794 Long term (current) use of insulin: Secondary | ICD-10-CM | POA: Diagnosis not present

## 2024-07-01 DIAGNOSIS — I447 Left bundle-branch block, unspecified: Secondary | ICD-10-CM | POA: Diagnosis not present

## 2024-07-01 DIAGNOSIS — M1612 Unilateral primary osteoarthritis, left hip: Secondary | ICD-10-CM | POA: Insufficient documentation

## 2024-07-01 DIAGNOSIS — N1832 Chronic kidney disease, stage 3b: Secondary | ICD-10-CM | POA: Diagnosis not present

## 2024-07-01 DIAGNOSIS — I428 Other cardiomyopathies: Secondary | ICD-10-CM | POA: Diagnosis not present

## 2024-07-01 DIAGNOSIS — E1122 Type 2 diabetes mellitus with diabetic chronic kidney disease: Secondary | ICD-10-CM | POA: Diagnosis not present

## 2024-07-01 DIAGNOSIS — Z9221 Personal history of antineoplastic chemotherapy: Secondary | ICD-10-CM | POA: Diagnosis not present

## 2024-07-01 HISTORY — DX: Anxiety disorder, unspecified: F41.9

## 2024-07-01 LAB — BASIC METABOLIC PANEL WITH GFR
Anion gap: 12 (ref 5–15)
BUN: 18 mg/dL (ref 8–23)
CO2: 24 mmol/L (ref 22–32)
Calcium: 10.1 mg/dL (ref 8.9–10.3)
Chloride: 108 mmol/L (ref 98–111)
Creatinine, Ser: 1.13 mg/dL — ABNORMAL HIGH (ref 0.44–1.00)
GFR, Estimated: 51 mL/min — ABNORMAL LOW
Glucose, Bld: 114 mg/dL — ABNORMAL HIGH (ref 70–99)
Potassium: 5 mmol/L (ref 3.5–5.1)
Sodium: 144 mmol/L (ref 135–145)

## 2024-07-01 LAB — CBC
HCT: 36.4 % (ref 36.0–46.0)
Hemoglobin: 11.4 g/dL — ABNORMAL LOW (ref 12.0–15.0)
MCH: 28.9 pg (ref 26.0–34.0)
MCHC: 31.3 g/dL (ref 30.0–36.0)
MCV: 92.2 fL (ref 80.0–100.0)
Platelets: 508 K/uL — ABNORMAL HIGH (ref 150–400)
RBC: 3.95 MIL/uL (ref 3.87–5.11)
RDW: 13.7 % (ref 11.5–15.5)
WBC: 15.2 K/uL — ABNORMAL HIGH (ref 4.0–10.5)
nRBC: 0 % (ref 0.0–0.2)

## 2024-07-01 LAB — SURGICAL PCR SCREEN
MRSA, PCR: NEGATIVE
Staphylococcus aureus: NEGATIVE

## 2024-07-01 LAB — HEMOGLOBIN A1C
Hgb A1c MFr Bld: 7.2 % — ABNORMAL HIGH (ref 4.8–5.6)
Mean Plasma Glucose: 159.94 mg/dL

## 2024-07-01 LAB — GLUCOSE, CAPILLARY: Glucose-Capillary: 118 mg/dL — ABNORMAL HIGH (ref 70–99)

## 2024-07-01 NOTE — Progress Notes (Signed)
 Lab. Results: WBC: 15.2

## 2024-07-01 NOTE — Progress Notes (Addendum)
 For Anesthesia: PCP - Nedra Tinnie LABOR, NP  Cardiologist - Rolan Ezra RAMAN, MD LOV: 04/01/24  Bowel Prep reminder:  Chest x-ray - 11/25/23 EKG - 04/08/24 Stress Test - 10/11/15 ECHO - 04/01/24 Cardiac Cath -  Pacemaker/ICD device last checked: Pacemaker orders received: Device Rep notified:  Spinal Cord Stimulator:N/A  Sleep Study - Yes CPAP - NO  Fasting Blood Sugar - 120's Checks Blood Sugar _: Freestyle monitor Date and result of last Hgb A1c-  Last dose of GLP1 agonist- Trulicity : last dose 06/28/24 GLP1 instructions: Hold 7 days prior to schedule (Hold 24 hours-daily) after: 07/02/24   Last dose of SGLT-2 inhibitors-Jardiance  SGLT-2 instructions: Hold 72 hours prior to surgery: To hold it after: 07/06/24  Blood Thinner Instructions:N/A Last Dose: Time last taken:  Aspirin  Instructions: Last Dose: Time last taken:  Activity level: Can go up a flight of stairs and activities of daily living without stopping and without chest pain and/or shortness of breath   Able to exercise without chest pain and/or shortness of breath  Anesthesia review: Hx: CHF,HTN,Lt. BBB,DIA  Patient denies shortness of breath, fever, cough and chest pain at PAT appointment   Patient verbalized understanding of instructions that were reviewed over the telephone.

## 2024-07-02 NOTE — Anesthesia Preprocedure Evaluation (Signed)
 "                                  Anesthesia Evaluation  Patient identified by MRN, date of birth, ID band Patient awake  General Assessment Comment:  One episode of emesis in preop temporally related with IV placement and preop meds administration. Patient chalked it up to nerves. NO nausea or vomiting before that, or since.  Reviewed: Allergy & Precautions, NPO status , Patient's Chart, lab work & pertinent test results  History of Anesthesia Complications Negative for: history of anesthetic complications  Airway Mallampati: II  TM Distance: >3 FB Neck ROM: Full    Dental no notable dental hx. (+) Teeth Intact   Pulmonary neg pulmonary ROS, asthma , neg sleep apnea, neg COPD, Patient abstained from smoking.Not current smoker   Pulmonary exam normal breath sounds clear to auscultation       Cardiovascular Exercise Tolerance: Good METShypertension, +CHF  (-) CAD and (-) Past MI + dysrhythmias  Rhythm:Regular Rate:Normal - Systolic murmurs Echo 04/01/24:   IMPRESSIONS      1. Left ventricular ejection fraction, by estimation, is 55 to 60%. The left ventricle has normal function. The left ventricle has no regional wall motion abnormalities. Left ventricular diastolic parameters are consistent with Grade I diastolic dysfunction (impaired relaxation).  2. Right ventricular systolic function is normal. The right ventricular size is normal.  3. The mitral valve is abnormal. Mild mitral valve regurgitation. No evidence of mitral stenosis. Moderate mitral annular calcification.  4. The aortic valve is normal in structure. There is mild calcification of the aortic valve. Aortic valve regurgitation is not visualized. Aortic valve sclerosis/calcification is present, without any evidence of aortic stenosis.  5. The inferior vena cava is normal in size with greater than 50% respiratory variability, suggesting right atrial pressure of 3 mmHg.   Neuro/Psych  PSYCHIATRIC  DISORDERS Anxiety Depression    negative neurological ROS  negative psych ROS   GI/Hepatic ,GERD  Controlled,,(+)     (-) substance abuse    Endo/Other  diabetes, Type 2, Insulin  Dependent    Renal/GU negative Renal ROS     Musculoskeletal  (+) Arthritis ,    Abdominal   Peds  Hematology Denies blood thinner use or bleeding disorders.    Anesthesia Other Findings Per PAT note: Margaret Norman is a 74 yo female with PMH of HTN, LBBB, NICM, GERD, IDDM (A1c 7.2), breast cancer s/p R lumpectomy (2020), chemo, radiation, s/p splenectomy, anxiety, depression, arthritis   Patient is s/p L ORIF of the hip in 11/2023 under general anesthesia after a fall.   Patient follows with Cardiology for hx of LBBB with NICM  in the setting of chemo with normal EF by last Echo in 03/2024. Last seen by Dr. Rolan on 04/01/24. She reported orthostatic symptoms but otherwise no cardiac symptoms. Coreg  dose was decreased. Per Dr. Rolan regarding clearance:   Hip OA: She needs left THR.  At this point, with EF back to normal range and minimal cardiac symptoms, I think she is at low risk for operative complications.   Follows with Oncology for breast cancer s/p tx. Last seen on 05/11/24 and undergoing surveillance. Advised f/u in 1 year.   Follows with Endocrine for T2DM on insulin . A1c was 7.2 on pre op labs. CBG 118.   LD Jardiance : 1/12 LD Trulicity : 1/8   VS: BP 132/75   Pulse 74  Temp 36.8 C (Oral)   Ht 5' 2 (1.575 m)   Wt 54.4 kg   SpO2 99%   BMI 21.95 kg/m    PROVIDERS: McElwee, Lauren A, NP     LABS: Labs reviewed: Acceptable for surgery. 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 (all labs ordered are listed, but only abnormal results are displayed)   Labs Reviewed HEMOGLOBIN A1C - Abnormal; Notable for the following components:     Result Value    Hgb A1c MFr Bld 7.2 (*)     All other components within normal limits CBC - Abnormal; Notable for the  following components:   WBC 15.2 (*)     Hemoglobin 11.4 (*)     Platelets 508 (*)     All other components within normal limits BASIC METABOLIC PANEL WITH GFR - Abnormal; Notable for the following components:   Glucose, Bld 114 (*)     Creatinine, Ser 1.13 (*)     GFR, Estimated 51 (*)     All other components within normal limits GLUCOSE, CAPILLARY - Abnormal; Notable for the following components:   Glucose-Capillary 118 (*)     All other components within normal limits SURGICAL PCR SCREEN TYPE AND SCREEN          Reproductive/Obstetrics                              Anesthesia Physical Anesthesia Plan  ASA: 3  Anesthesia Plan: Spinal   Post-op Pain Management: Tylenol  PO (pre-op)*   Induction: Intravenous  PONV Risk Score and Plan: 2 and Ondansetron , Dexamethasone , Propofol  infusion, TIVA and Treatment may vary due to age or medical condition  Airway Management Planned: Natural Airway  Additional Equipment: None  Intra-op Plan:   Post-operative Plan:   Informed Consent: I have reviewed the patients History and Physical, chart, labs and discussed the procedure including the risks, benefits and alternatives for the proposed anesthesia with the patient or authorized representative who has indicated his/her understanding and acceptance.       Plan Discussed with: CRNA and Surgeon  Anesthesia Plan Comments: (Discussed R/B/A of neuraxial anesthesia technique with patient: - rare risks of spinal/epidural hematoma, nerve damage, infection - Risk of PDPH - Risk of difficulty with spontaneous respiration requiring airway intervention and its associated risks - PONV  Patient understands.  )         Anesthesia Quick Evaluation  "

## 2024-07-02 NOTE — Progress Notes (Signed)
 " Case: [id number] Date/Time: 07/10/24 0715   Procedure: CONVERSION, PREVIOUS HIP SURGERY, TO TOTAL HIP ARTHROPLASTY (Left: Hip) - CONVERSION OF LEFT HIP SURGERY to LEFT TOTAL HIP ARTHROPLASTY   Anesthesia type: Spinal   Diagnosis: Primary osteoarthritis of left hip [M16.12]   Pre-op diagnosis: osteoarthritis of left hip   Location: [facility] ROOM 08 / [facility]   Surgeons: Jerri Kay HERO, MD       DISCUSSION: Margaret Norman is a 74 yo female with PMH of HTN, LBBB, NICM, GERD, IDDM (A1c 7.2), breast cancer s/p R lumpectomy (2020), chemo, radiation, s/p splenectomy, anxiety, depression, arthritis  Patient is s/p L ORIF of the hip in 11/2023 under general anesthesia after a fall.  Patient follows with Cardiology for hx of LBBB with NICM  in the setting of chemo with normal EF by last Echo in 03/2024. Last seen by Dr. Rolan on 04/01/24. She reported orthostatic symptoms but otherwise no cardiac symptoms. Coreg  dose was decreased. Per Dr. Rolan regarding clearance:  Hip OA: She needs left THR.  At this point, with EF back to normal range and minimal cardiac symptoms, I think she is at low risk for operative complications.  Follows with Oncology for breast cancer s/p tx. Last seen on 05/11/24 and undergoing surveillance. Advised f/u in 1 year.  Follows with Endocrine for T2DM on insulin . A1c was 7.2 on pre op labs. CBG 118.  LD Jardiance : 1/12 LD Trulicity : 1/8  VS: BP 132/75   Pulse 74   Temp 36.8 C (Oral)   Ht 5' 2 (1.575 m)   Wt 54.4 kg   SpO2 99%   BMI 21.95 kg/m   PROVIDERS: McElwee, Lauren A, NP   LABS: Labs reviewed: Acceptable for surgery. 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 (all labs ordered are listed, but only abnormal results are displayed)  Labs Reviewed  HEMOGLOBIN A1C - Abnormal; Notable for the following components:      Result Value   Hgb A1c MFr Bld 7.2 (*)    All other components within normal limits  CBC - Abnormal; Notable for the following  components:   WBC 15.2 (*)    Hemoglobin 11.4 (*)    Platelets 508 (*)    All other components within normal limits  BASIC METABOLIC PANEL WITH GFR - Abnormal; Notable for the following components:   Glucose, Bld 114 (*)    Creatinine, Ser 1.13 (*)    GFR, Estimated 51 (*)    All other components within normal limits  GLUCOSE, CAPILLARY - Abnormal; Notable for the following components:   Glucose-Capillary 118 (*)    All other components within normal limits  SURGICAL PCR SCREEN  TYPE AND SCREEN     Echo 04/01/24:  IMPRESSIONS    1. Left ventricular ejection fraction, by estimation, is 55 to 60%. The left ventricle has normal function. The left ventricle has no regional wall motion abnormalities. Left ventricular diastolic parameters are consistent with Grade I diastolic dysfunction (impaired relaxation).  2. Right ventricular systolic function is normal. The right ventricular size is normal.  3. The mitral valve is abnormal. Mild mitral valve regurgitation. No evidence of mitral stenosis. Moderate mitral annular calcification.  4. The aortic valve is normal in structure. There is mild calcification of the aortic valve. Aortic valve regurgitation is not visualized. Aortic valve sclerosis/calcification is present, without any evidence of aortic stenosis.  5. The inferior vena cava is normal in size with greater than 50% respiratory variability, suggesting  right atrial pressure of 3 mmHg.  Left and right heart cath 09/28/2019:  1. Low filling pressures 2. Normal cardiac output 3. No significant coronary disease, nonischemic cardiomyopathy.    Past Medical History:  Diagnosis Date   Allergy AMOXICILLAN   Anxiety    Arthritis    Breast cancer (HCC) 03/05/2019   Cataract    Chest pain    Nuclear, adenosine ,  December, 2013, low risk nuclear scan with small, moderate in intensity, fixed anteroseptal defect. This is possibly related to an LBBB versus small prior infarct. 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   Type II or unspecified type diabetes mellitus without mention of complication, not stated as uncontrolled     Past Surgical History:  Procedure Laterality Date   APPENDECTOMY     During C-Section Birth   BREAST BIOPSY Left 10/2017   BREAST BIOPSY Right 03/2019   BREAST BIOPSY Right 08/26/2023   US  RT BREAST BX W LOC DEV 1ST LESION IMG BX SPEC US  GUIDE 08/26/2023 [facility] MAMMOGRAPHY   BREAST LUMPECTOMY Right 2020   BREAST LUMPECTOMY WITH RADIOACTIVE SEED AND SENTINEL LYMPH NODE BIOPSY Right 04/15/2019   Procedure: RIGHT BREAST PARTIAL MASTECTOMY WITH RADIOACTIVE SEED AND SENTINEL LYMPH NODE BIOPSY;  Surgeon: Vernetta Berg, MD;  Location: [facility];  Service: General;  Laterality: Right;   CATARACT EXTRACTION Bilateral 06/01/2024   06/22/24   CESAREAN SECTION     x2  w/appy   CHOLECYSTECTOMY     ESOPHAGOGASTRODUODENOSCOPY     FRACTURE SURGERY     HERNIA REPAIR     INTRAMEDULLARY (IM) NAIL INTERTROCHANTERIC Left 11/27/2023   Procedure: LEFT OPEN REDUCTION INTERNAL FIXATION (ORIF) INTERTROCHANTOR;  Surgeon: Jerri Kay HERO, MD;  Location: [facility];  Service: Orthopedics;  Laterality: Left;   KNEE ARTHROSCOPY Right 11/2005   LIVER BIOPSY     PANCREATIC CYST EXCISION     PORT-A-CATH REMOVAL Left 04/25/2020   Procedure: REMOVAL PORT-A-CATH;  Surgeon: Vernetta Berg, MD;  Location: [facility];  Service: General;  Laterality: Left;   PORTACATH PLACEMENT Left 04/15/2019   Procedure: INSERTION PORT-A-CATH WITH ULTRASOUND;  Surgeon: Vernetta Berg, MD;  Location: [facility];  Service: General;  Laterality: Left;   RIGHT/LEFT HEART CATH AND CORONARY  ANGIOGRAPHY N/A 09/28/2019   Procedure: RIGHT/LEFT HEART CATH AND CORONARY ANGIOGRAPHY;  Surgeon: Rolan Ezra RAMAN, MD;  Location: [facility] INVASIVE CV LAB;  Service: Cardiovascular;  Laterality: N/A;   SPLENECTOMY     TONSILLECTOMY AND ADENOIDECTOMY     TUBAL LIGATION  06/24/78    MEDICATIONS:  anastrozole  (ARIMIDEX ) 1 MG tablet   aspirin  (ASPIRIN  81) 81 MG chewable tablet   aspirin  EC 81 MG tablet   B Complex-C (SUPER B COMPLEX PO)   BESIVANCE 0.6 % SUSP   Bromfenac Sodium 0.07 % SOLN   buPROPion  (WELLBUTRIN  XL) 300 MG 24 hr tablet   carvedilol  (COREG ) 6.25 MG tablet   Cholecalciferol  (VITAMIN D ) 50 MCG (2000 UT) tablet   Continuous Glucose Sensor (FREESTYLE LIBRE 3 PLUS SENSOR) MISC   docusate sodium  (COLACE) 100 MG capsule   Dulaglutide  (TRULICITY ) 3 MG/0.5ML SOAJ   DUREZOL 0.05 %  EMUL   EASY COMFORT PEN NEEDLES 32G X 4 MM MISC   ENTRESTO  24-26 MG   furosemide  (LASIX ) 40 MG tablet   gatifloxacin (ZYMAXID) 0.5 % SOLN   HYDROcodone -acetaminophen  (NORCO/VICODIN) 5-325 MG tablet   insulin  glargine (LANTUS ) 100 UNIT/ML injection   JARDIANCE  25 MG TABS tablet   meclizine  (ANTIVERT ) 25 MG tablet   metFORMIN  (GLUCOPHAGE ) 1000 MG tablet   methocarbamol  (ROBAXIN ) 500 MG tablet   naproxen sodium (ALEVE) 220 MG tablet   ondansetron  (ZOFRAN ) 4 MG tablet   pantoprazole  (PROTONIX ) 40 MG tablet   prednisoLONE acetate (PRED FORTE) 1 % ophthalmic suspension   Probiotic Product (PROBIOTIC ADVANCED PO)   repaglinide  (PRANDIN ) 1 MG tablet   rosuvastatin  (CRESTOR ) 10 MG tablet   sertraline  (ZOLOFT ) 100 MG tablet   spironolactone  (ALDACTONE ) 25 MG tablet   Vitamin E 180 MG CAPS   zinc  gluconate 50 MG tablet   No current facility-administered medications for this encounter.   Burnard CHRISTELLA Odis DEVONNA [facility] Short Stay/Anesthesiology [facility] Phone [phone] 07/02/2024 3:51 PM        "

## 2024-07-09 MED ORDER — TRANEXAMIC ACID 1000 MG/10ML IV SOLN
2000.0000 mg | INTRAVENOUS | Status: AC
  Filled 2024-07-09: qty 20

## 2024-07-10 ENCOUNTER — Encounter (HOSPITAL_COMMUNITY): Payer: Self-pay | Admitting: Orthopaedic Surgery

## 2024-07-10 ENCOUNTER — Inpatient Hospital Stay (HOSPITAL_COMMUNITY): Payer: Self-pay | Admitting: Anesthesiology

## 2024-07-10 ENCOUNTER — Observation Stay (HOSPITAL_COMMUNITY)

## 2024-07-10 ENCOUNTER — Other Ambulatory Visit: Payer: Self-pay | Admitting: Physician Assistant

## 2024-07-10 ENCOUNTER — Other Ambulatory Visit: Payer: Self-pay

## 2024-07-10 ENCOUNTER — Inpatient Hospital Stay (HOSPITAL_COMMUNITY)
Admission: RE | Admit: 2024-07-10 | Discharge: 2024-07-11 | DRG: 498 | Disposition: A | Discharge status: Home / Self Care | Attending: Orthopaedic Surgery | Admitting: Orthopaedic Surgery

## 2024-07-10 ENCOUNTER — Inpatient Hospital Stay (HOSPITAL_COMMUNITY)

## 2024-07-10 ENCOUNTER — Encounter (HOSPITAL_COMMUNITY)
Admission: RE | Disposition: A | Discharge status: Home / Self Care | Payer: Self-pay | Source: Home / Self Care | Attending: Orthopaedic Surgery

## 2024-07-10 ENCOUNTER — Encounter (HOSPITAL_COMMUNITY): Payer: Self-pay | Admitting: Medical

## 2024-07-10 DIAGNOSIS — Z823 Family history of stroke: Secondary | ICD-10-CM

## 2024-07-10 DIAGNOSIS — Z9049 Acquired absence of other specified parts of digestive tract: Secondary | ICD-10-CM

## 2024-07-10 DIAGNOSIS — Z8349 Family history of other endocrine, nutritional and metabolic diseases: Secondary | ICD-10-CM | POA: Diagnosis not present

## 2024-07-10 DIAGNOSIS — Z794 Long term (current) use of insulin: Secondary | ICD-10-CM | POA: Diagnosis not present

## 2024-07-10 DIAGNOSIS — Z79899 Other long term (current) drug therapy: Secondary | ICD-10-CM

## 2024-07-10 DIAGNOSIS — S72142E Displaced intertrochanteric fracture of left femur, subsequent encounter for open fracture type I or II with routine healing: Secondary | ICD-10-CM | POA: Diagnosis not present

## 2024-07-10 DIAGNOSIS — Z888 Allergy status to other drugs, medicaments and biological substances status: Secondary | ICD-10-CM

## 2024-07-10 DIAGNOSIS — Z5948 Other specified lack of adequate food: Secondary | ICD-10-CM | POA: Diagnosis not present

## 2024-07-10 DIAGNOSIS — Z96642 Presence of left artificial hip joint: Secondary | ICD-10-CM

## 2024-07-10 DIAGNOSIS — S72145A Nondisplaced intertrochanteric fracture of left femur, initial encounter for closed fracture: Secondary | ICD-10-CM | POA: Diagnosis present

## 2024-07-10 DIAGNOSIS — I110 Hypertensive heart disease with heart failure: Secondary | ICD-10-CM | POA: Diagnosis present

## 2024-07-10 DIAGNOSIS — Z8249 Family history of ischemic heart disease and other diseases of the circulatory system: Secondary | ICD-10-CM | POA: Diagnosis not present

## 2024-07-10 DIAGNOSIS — F32A Depression, unspecified: Secondary | ICD-10-CM | POA: Diagnosis present

## 2024-07-10 DIAGNOSIS — Z9081 Acquired absence of spleen: Secondary | ICD-10-CM

## 2024-07-10 DIAGNOSIS — E119 Type 2 diabetes mellitus without complications: Secondary | ICD-10-CM | POA: Diagnosis not present

## 2024-07-10 DIAGNOSIS — Z833 Family history of diabetes mellitus: Secondary | ICD-10-CM

## 2024-07-10 DIAGNOSIS — Z853 Personal history of malignant neoplasm of breast: Secondary | ICD-10-CM

## 2024-07-10 DIAGNOSIS — Z79811 Long term (current) use of aromatase inhibitors: Secondary | ICD-10-CM

## 2024-07-10 DIAGNOSIS — Z7982 Long term (current) use of aspirin: Secondary | ICD-10-CM | POA: Diagnosis not present

## 2024-07-10 DIAGNOSIS — Z7984 Long term (current) use of oral hypoglycemic drugs: Secondary | ICD-10-CM

## 2024-07-10 DIAGNOSIS — Z818 Family history of other mental and behavioral disorders: Secondary | ICD-10-CM | POA: Diagnosis not present

## 2024-07-10 DIAGNOSIS — M1612 Unilateral primary osteoarthritis, left hip: Secondary | ICD-10-CM | POA: Diagnosis present

## 2024-07-10 DIAGNOSIS — E785 Hyperlipidemia, unspecified: Secondary | ICD-10-CM | POA: Diagnosis present

## 2024-07-10 DIAGNOSIS — I509 Heart failure, unspecified: Secondary | ICD-10-CM

## 2024-07-10 DIAGNOSIS — I5032 Chronic diastolic (congestive) heart failure: Secondary | ICD-10-CM | POA: Diagnosis present

## 2024-07-10 DIAGNOSIS — Z59868 Other specified financial insecurity: Secondary | ICD-10-CM

## 2024-07-10 DIAGNOSIS — Z5941 Food insecurity: Secondary | ICD-10-CM | POA: Diagnosis not present

## 2024-07-10 DIAGNOSIS — Z880 Allergy status to penicillin: Secondary | ICD-10-CM

## 2024-07-10 DIAGNOSIS — Z7985 Long-term (current) use of injectable non-insulin antidiabetic drugs: Secondary | ICD-10-CM | POA: Diagnosis not present

## 2024-07-10 HISTORY — PX: CONVERSION TO TOTAL HIP: SHX5784

## 2024-07-10 LAB — GLUCOSE, CAPILLARY
Glucose-Capillary: 146 mg/dL — ABNORMAL HIGH (ref 70–99)
Glucose-Capillary: 174 mg/dL — ABNORMAL HIGH (ref 70–99)
Glucose-Capillary: 182 mg/dL — ABNORMAL HIGH (ref 70–99)
Glucose-Capillary: 172 mg/dL — ABNORMAL HIGH (ref 70–99)

## 2024-07-10 LAB — TYPE AND SCREEN
ABO/RH(D): O NEG
Antibody Screen: NEGATIVE

## 2024-07-10 LAB — ABO/RH: ABO/RH(D): O NEG

## 2024-07-10 MED ORDER — VASOPRESSIN 20 UNIT/ML IV SOLN
INTRAVENOUS | Status: DC | PRN
  Administered 2024-07-10 (×2): 1 [IU] via INTRAVENOUS

## 2024-07-10 MED ORDER — MENTHOL 3 MG MT LOZG: 1.0000 | LOZENGE | OROMUCOSAL | Status: DC | PRN

## 2024-07-10 MED ORDER — VANCOMYCIN HCL 1 G IV SOLR
INTRAVENOUS | Status: DC | PRN
  Administered 2024-07-10: 1000 mg

## 2024-07-10 MED ORDER — HYDROCODONE-ACETAMINOPHEN 7.5-325 MG PO TABS: 1.0000 | ORAL_TABLET | Freq: Three times a day (TID) | ORAL | Status: DC | PRN

## 2024-07-10 MED ORDER — PANTOPRAZOLE SODIUM 40 MG PO TBEC
40.0000 mg | DELAYED_RELEASE_TABLET | Freq: Every day | ORAL | Status: DC
  Administered 2024-07-11: 40 mg via ORAL
  Filled 2024-07-10: qty 1

## 2024-07-10 MED ORDER — PROPOFOL 10 MG/ML IV BOLUS
INTRAVENOUS | Status: DC | PRN
  Administered 2024-07-10 (×2): 10 mg via INTRAVENOUS

## 2024-07-10 MED ORDER — ASPIRIN 81 MG PO CHEW
81.0000 mg | CHEWABLE_TABLET | Freq: Two times a day (BID) | ORAL | Status: DC
  Administered 2024-07-10 – 2024-07-11 (×2): 81 mg via ORAL
  Filled 2024-07-10 (×2): qty 1

## 2024-07-10 MED ORDER — METHOCARBAMOL 1000 MG/10ML IJ SOLN: 500.0000 mg | Freq: Four times a day (QID) | INTRAMUSCULAR | Status: DC | PRN

## 2024-07-10 MED ORDER — ACETAMINOPHEN 10 MG/ML IV SOLN
INTRAVENOUS | Status: DC | PRN
  Administered 2024-07-10: 1000 mg via INTRAVENOUS

## 2024-07-10 MED ORDER — SORBITOL 70 % SOLN: 30.0000 mL | Freq: Every day | Status: DC | PRN

## 2024-07-10 MED ORDER — DEXAMETHASONE SOD PHOSPHATE PF 10 MG/ML IJ SOLN
INTRAMUSCULAR | Status: DC | PRN
  Administered 2024-07-10: 4 mg via INTRAVENOUS

## 2024-07-10 MED ORDER — TRANEXAMIC ACID-NACL 1000-0.7 MG/100ML-% IV SOLN
1000.0000 mg | Freq: Once | INTRAVENOUS | Status: AC
  Administered 2024-07-10: 1000 mg via INTRAVENOUS
  Filled 2024-07-10: qty 100

## 2024-07-10 MED ORDER — SPIRONOLACTONE 12.5 MG HALF TABLET
12.5000 mg | ORAL_TABLET | Freq: Every day | ORAL | Status: DC
  Administered 2024-07-11: 12.5 mg via ORAL
  Filled 2024-07-10: qty 1

## 2024-07-10 MED ORDER — INSULIN ASPART 100 UNIT/ML IJ SOLN
0.0000 [IU] | Freq: Three times a day (TID) | INTRAMUSCULAR | Status: DC
  Administered 2024-07-10: 3 [IU] via SUBCUTANEOUS
  Administered 2024-07-11: 8 [IU] via SUBCUTANEOUS
  Administered 2024-07-11: 2 [IU] via SUBCUTANEOUS
  Filled 2024-07-10: qty 8
  Filled 2024-07-10: qty 3
  Filled 2024-07-10: qty 2

## 2024-07-10 MED ORDER — CARVEDILOL 6.25 MG PO TABS
6.2500 mg | ORAL_TABLET | Freq: Two times a day (BID) | ORAL | Status: DC
  Administered 2024-07-10: 6.25 mg via ORAL
  Filled 2024-07-10 (×2): qty 1

## 2024-07-10 MED ORDER — ALBUMIN HUMAN 5 % IV SOLN
INTRAVENOUS | Status: AC
  Filled 2024-07-10: qty 250

## 2024-07-10 MED ORDER — EPHEDRINE SULFATE (PRESSORS) 25 MG/5ML IV SOSY
PREFILLED_SYRINGE | INTRAVENOUS | Status: DC | PRN
  Administered 2024-07-10 (×2): 5 mg via INTRAVENOUS

## 2024-07-10 MED ORDER — POVIDONE-IODINE 10 % EX SWAB: 2.0000 | Freq: Once | CUTANEOUS | Status: DC

## 2024-07-10 MED ORDER — ACETAMINOPHEN 500 MG PO TABS
1000.0000 mg | ORAL_TABLET | Freq: Four times a day (QID) | ORAL | Status: AC
  Administered 2024-07-10 – 2024-07-11 (×3): 1000 mg via ORAL
  Filled 2024-07-10 (×4): qty 2

## 2024-07-10 MED ORDER — DOXYCYCLINE HYCLATE 100 MG PO TABS
100.0000 mg | ORAL_TABLET | Freq: Two times a day (BID) | ORAL | Status: DC
  Administered 2024-07-11: 100 mg via ORAL
  Filled 2024-07-10: qty 1

## 2024-07-10 MED ORDER — POLYETHYLENE GLYCOL 3350 17 G PO PACK
17.0000 g | PACK | Freq: Every day | ORAL | Status: DC
  Administered 2024-07-11: 17 g via ORAL

## 2024-07-10 MED ORDER — DEXMEDETOMIDINE HCL IN NACL 80 MCG/20ML IV SOLN
INTRAVENOUS | Status: DC | PRN
  Administered 2024-07-10 (×2): 4 ug via INTRAVENOUS

## 2024-07-10 MED ORDER — ALBUMIN HUMAN 5 % IV SOLN: INTRAVENOUS | Status: DC | PRN

## 2024-07-10 MED ORDER — BUPIVACAINE-MELOXICAM ER 200-6 MG/7ML IJ SOLN
INTRAMUSCULAR | Status: DC | PRN
  Administered 2024-07-10: 400 mg

## 2024-07-10 MED ORDER — LACTATED RINGERS IV SOLN: INTRAVENOUS | Status: DC

## 2024-07-10 MED ORDER — INSULIN ASPART 100 UNIT/ML IJ SOLN: 0.0000 [IU] | INTRAMUSCULAR | Status: DC | PRN

## 2024-07-10 MED ORDER — DIPHENHYDRAMINE HCL 12.5 MG/5ML PO ELIX: 25.0000 mg | ORAL_SOLUTION | ORAL | Status: DC | PRN

## 2024-07-10 MED ORDER — ONDANSETRON HCL 4 MG/2ML IJ SOLN
INTRAMUSCULAR | Status: DC | PRN
  Administered 2024-07-10: 4 mg via INTRAVENOUS

## 2024-07-10 MED ORDER — CEFAZOLIN SODIUM-DEXTROSE 2-4 GM/100ML-% IV SOLN
2.0000 g | INTRAVENOUS | Status: AC
  Administered 2024-07-10: 2 g via INTRAVENOUS
  Filled 2024-07-10: qty 100

## 2024-07-10 MED ORDER — VANCOMYCIN HCL 1000 MG IV SOLR
INTRAVENOUS | Status: AC
  Filled 2024-07-10: qty 20

## 2024-07-10 MED ORDER — DOCUSATE SODIUM 100 MG PO CAPS
100.0000 mg | ORAL_CAPSULE | Freq: Two times a day (BID) | ORAL | Status: DC
  Administered 2024-07-10 – 2024-07-11 (×2): 100 mg via ORAL
  Filled 2024-07-10 (×2): qty 1

## 2024-07-10 MED ORDER — MIDAZOLAM HCL 2 MG/2ML IJ SOLN
INTRAMUSCULAR | Status: AC
  Filled 2024-07-10: qty 2

## 2024-07-10 MED ORDER — HYDROCODONE-ACETAMINOPHEN 5-325 MG PO TABS
1.0000 | ORAL_TABLET | Freq: Three times a day (TID) | ORAL | Status: DC | PRN
  Administered 2024-07-11: 2 via ORAL
  Filled 2024-07-10: qty 1
  Filled 2024-07-10: qty 2

## 2024-07-10 MED ORDER — CEFAZOLIN SODIUM-DEXTROSE 2-4 GM/100ML-% IV SOLN
2.0000 g | Freq: Four times a day (QID) | INTRAVENOUS | Status: AC
  Administered 2024-07-10 (×2): 2 g via INTRAVENOUS
  Filled 2024-07-10 (×2): qty 100

## 2024-07-10 MED ORDER — MIDAZOLAM HCL (PF) 2 MG/2ML IJ SOLN
INTRAMUSCULAR | Status: DC | PRN
  Administered 2024-07-10 (×2): .5 mg via INTRAVENOUS

## 2024-07-10 MED ORDER — MORPHINE SULFATE (PF) 2 MG/ML IV SOLN
0.5000 mg | Freq: Four times a day (QID) | INTRAVENOUS | Status: DC | PRN
  Administered 2024-07-10: 1 mg via INTRAVENOUS

## 2024-07-10 MED ORDER — ONDANSETRON HCL 4 MG/2ML IJ SOLN
4.0000 mg | Freq: Four times a day (QID) | INTRAMUSCULAR | Status: DC | PRN
  Administered 2024-07-10: 4 mg via INTRAVENOUS
  Filled 2024-07-10: qty 2

## 2024-07-10 MED ORDER — ACETAMINOPHEN 325 MG PO TABS: 325.0000 mg | ORAL_TABLET | Freq: Four times a day (QID) | ORAL | Status: DC | PRN

## 2024-07-10 MED ORDER — METOCLOPRAMIDE HCL 5 MG PO TABS
5.0000 mg | ORAL_TABLET | Freq: Three times a day (TID) | ORAL | Status: DC | PRN
  Administered 2024-07-11: 10 mg via ORAL
  Filled 2024-07-10: qty 2

## 2024-07-10 MED ORDER — FENTANYL CITRATE (PF) 50 MCG/ML IJ SOSY: 25.0000 ug | PREFILLED_SYRINGE | INTRAMUSCULAR | Status: DC | PRN

## 2024-07-10 MED ORDER — ALUM & MAG HYDROXIDE-SIMETH 200-200-20 MG/5ML PO SUSP: 30.0000 mL | ORAL | Status: DC | PRN

## 2024-07-10 MED ORDER — SODIUM CHLORIDE 0.9 % IV SOLN: INTRAVENOUS | Status: DC

## 2024-07-10 MED ORDER — DROPERIDOL 2.5 MG/ML IJ SOLN: 0.6250 mg | Freq: Once | INTRAMUSCULAR | Status: DC | PRN

## 2024-07-10 MED ORDER — 0.9 % SODIUM CHLORIDE (POUR BTL) OPTIME
TOPICAL | Status: DC | PRN
  Administered 2024-07-10: 1000 mL

## 2024-07-10 MED ORDER — METHOCARBAMOL 500 MG PO TABS
500.0000 mg | ORAL_TABLET | Freq: Four times a day (QID) | ORAL | Status: DC | PRN
  Administered 2024-07-10 – 2024-07-11 (×2): 500 mg via ORAL
  Filled 2024-07-10 (×2): qty 1

## 2024-07-10 MED ORDER — MORPHINE SULFATE (PF) 2 MG/ML IV SOLN
INTRAVENOUS | Status: AC
  Filled 2024-07-10: qty 1

## 2024-07-10 MED ORDER — METOCLOPRAMIDE HCL 5 MG/ML IJ SOLN: 5.0000 mg | Freq: Three times a day (TID) | INTRAMUSCULAR | Status: DC | PRN

## 2024-07-10 MED ORDER — DEXAMETHASONE SOD PHOSPHATE PF 10 MG/ML IJ SOLN
INTRAMUSCULAR | Status: AC
  Filled 2024-07-10: qty 1

## 2024-07-10 MED ORDER — PHENYLEPHRINE HCL-NACL 20-0.9 MG/250ML-% IV SOLN
INTRAVENOUS | Status: DC | PRN
  Administered 2024-07-10: 40 ug/min via INTRAVENOUS

## 2024-07-10 MED ORDER — BUPIVACAINE-MELOXICAM ER 400-12 MG/14ML IJ SOLN
INTRAMUSCULAR | Status: AC
  Filled 2024-07-10: qty 1

## 2024-07-10 MED ORDER — ISOPROPYL ALCOHOL 70 % SOLN
Status: AC
  Filled 2024-07-10: qty 480

## 2024-07-10 MED ORDER — ONDANSETRON HCL 4 MG PO TABS
4.0000 mg | ORAL_TABLET | Freq: Four times a day (QID) | ORAL | Status: DC | PRN
  Administered 2024-07-11: 4 mg via ORAL
  Filled 2024-07-10: qty 1

## 2024-07-10 MED ORDER — INSULIN ASPART 100 UNIT/ML IJ SOLN: 0.0000 [IU] | Freq: Every day | INTRAMUSCULAR | Status: DC

## 2024-07-10 MED ORDER — ENSURE PLUS HIGH PROTEIN PO LIQD
237.0000 mL | Freq: Two times a day (BID) | ORAL | Status: DC
  Administered 2024-07-11: 237 mL via ORAL

## 2024-07-10 MED ORDER — PHENOL 1.4 % MT LIQD: 1.0000 | OROMUCOSAL | Status: DC | PRN

## 2024-07-10 MED ORDER — MAGNESIUM CITRATE PO SOLN: 1.0000 | Freq: Once | ORAL | Status: DC | PRN

## 2024-07-10 MED ORDER — TRANEXAMIC ACID-NACL 1000-0.7 MG/100ML-% IV SOLN
1000.0000 mg | INTRAVENOUS | Status: AC
  Administered 2024-07-10: 1000 mg via INTRAVENOUS
  Filled 2024-07-10: qty 100

## 2024-07-10 MED ORDER — BUPIVACAINE IN DEXTROSE 0.75-8.25 % IT SOLN
INTRATHECAL | Status: DC | PRN
  Administered 2024-07-10: 1.8 mL via INTRATHECAL

## 2024-07-10 MED ORDER — LIDOCAINE HCL (PF) 2 % IJ SOLN
INTRAMUSCULAR | Status: AC
  Filled 2024-07-10: qty 5

## 2024-07-10 MED ORDER — LIDOCAINE HCL (PF) 2 % IJ SOLN
INTRAMUSCULAR | Status: DC | PRN
  Administered 2024-07-10: 40 mg via INTRADERMAL

## 2024-07-10 MED ORDER — VASOPRESSIN 20 UNIT/ML IV SOLN
INTRAVENOUS | Status: AC
  Filled 2024-07-10: qty 1

## 2024-07-10 MED ORDER — CHLORHEXIDINE GLUCONATE 0.12 % MT SOLN
15.0000 mL | Freq: Once | OROMUCOSAL | Status: AC
  Administered 2024-07-10: 15 mL via OROMUCOSAL

## 2024-07-10 MED ORDER — DEXAMETHASONE SOD PHOSPHATE PF 10 MG/ML IJ SOLN
10.0000 mg | Freq: Once | INTRAMUSCULAR | Status: AC
  Administered 2024-07-11: 10 mg via INTRAVENOUS
  Filled 2024-07-10: qty 1

## 2024-07-10 MED ORDER — EMPAGLIFLOZIN 25 MG PO TABS
25.0000 mg | ORAL_TABLET | Freq: Every day | ORAL | Status: DC
  Administered 2024-07-11: 25 mg via ORAL
  Filled 2024-07-10: qty 1

## 2024-07-10 MED ORDER — TRANEXAMIC ACID 1000 MG/10ML IV SOLN
INTRAVENOUS | Status: DC | PRN
  Administered 2024-07-10: 2000 mg via TOPICAL

## 2024-07-10 MED ORDER — ISOPROPYL ALCOHOL 70 % SOLN
Status: DC | PRN
  Administered 2024-07-10: 1 via TOPICAL

## 2024-07-10 MED ORDER — FENTANYL CITRATE (PF) 100 MCG/2ML IJ SOLN
INTRAMUSCULAR | Status: DC | PRN
  Administered 2024-07-10: 25 ug via INTRAVENOUS
  Administered 2024-07-10: 50 ug via INTRAVENOUS
  Administered 2024-07-10: 25 ug via INTRAVENOUS

## 2024-07-10 MED ORDER — ACETAMINOPHEN 500 MG PO TABS
1000.0000 mg | ORAL_TABLET | Freq: Once | ORAL | Status: AC
  Administered 2024-07-10: 1000 mg via ORAL
  Filled 2024-07-10: qty 2

## 2024-07-10 MED ORDER — INSULIN GLARGINE 100 UNIT/ML ~~LOC~~ SOLN
15.0000 [IU] | Freq: Every day | SUBCUTANEOUS | Status: DC
  Administered 2024-07-11: 15 [IU] via SUBCUTANEOUS
  Filled 2024-07-10: qty 0.15

## 2024-07-10 MED ORDER — OXYCODONE HCL 5 MG PO TABS: 5.0000 mg | ORAL_TABLET | Freq: Once | ORAL | Status: DC | PRN

## 2024-07-10 MED ORDER — PROPOFOL 500 MG/50ML IV EMUL
INTRAVENOUS | Status: DC | PRN
  Administered 2024-07-10: 50 ug/kg/min via INTRAVENOUS

## 2024-07-10 MED ORDER — ACETAMINOPHEN 10 MG/ML IV SOLN
INTRAVENOUS | Status: AC
  Filled 2024-07-10: qty 100

## 2024-07-10 MED ORDER — ORAL CARE MOUTH RINSE: 15.0000 mL | Freq: Once | OROMUCOSAL | Status: AC

## 2024-07-10 MED ORDER — DOXYCYCLINE HYCLATE 100 MG PO CAPS: 100.0000 mg | ORAL_CAPSULE | Freq: Two times a day (BID) | ORAL | 0 refills | Status: AC

## 2024-07-10 MED ORDER — FENTANYL CITRATE (PF) 100 MCG/2ML IJ SOLN
INTRAMUSCULAR | Status: AC
  Filled 2024-07-10: qty 2

## 2024-07-10 MED ORDER — SODIUM CHLORIDE 0.9 % IR SOLN
Status: DC | PRN
  Administered 2024-07-10: 1000 mL

## 2024-07-10 MED ORDER — OXYCODONE HCL 5 MG/5ML PO SOLN: 5.0000 mg | Freq: Once | ORAL | Status: DC | PRN

## 2024-07-10 MED ORDER — SODIUM CHLORIDE (PF) 0.9 % IJ SOLN
INTRAMUSCULAR | Status: AC
  Filled 2024-07-10: qty 10

## 2024-07-10 NOTE — Anesthesia Procedure Notes (Signed)
 Spinal  Patient location during procedure: OR Start time: 07/10/2024 7:45 AM End time: 07/10/2024 7:40 AM Reason for block: surgical anesthesia  Staffing Performed: resident/CRNA  Authorized by: Boone Fess, MD   Performed by: Kathern Rollene LABOR, CRNA  Preanesthetic Checklist Completed: patient identified, IV checked, site marked, risks and benefits discussed, surgical consent, monitors and equipment checked, pre-op evaluation and timeout performed Spinal Block Patient position: sitting Prep: ChloraPrep Patient monitoring: heart rate, cardiac monitor, continuous pulse ox and blood pressure Approach: midline Location: L4-5 Injection technique: single-shot Needle Needle type: Pencan  Needle gauge: 24 G Needle length: 9 cm Needle insertion depth (cm): 4 Assessment Sensory level: T4 Events: CSF return

## 2024-07-10 NOTE — TOC Transition Note (Signed)
 Transition of Care [facility]) - Discharge Note   Patient Details  Name: Margaret Norman MRN: [id number] Date of Birth: 1951-04-18  [facility] CM/SW Contact:  NORMAN ASPEN, LCSW Phone Number: 07/10/2024, 2:10 PM   Clinical Narrative:     Met with pt who confirms she has needed DME in the home.  Pt aware that HHPT prearranged with [facility] prior to her surgery.  No further IP CM needs.  Final next level of care: Home w Home Health Services Barriers to Discharge: No Barriers Identified   Patient Goals and CMS Choice Patient states their goals for this hospitalization and ongoing recovery are:: return home          Discharge Placement                       Discharge Plan and Services Additional resources added to the After Visit Summary for                  DME Arranged: N/A DME Agency: NA       HH Arranged: PT HH Agency: [facility]        Social Drivers of Health (SDOH) Interventions SDOH Screenings   Food Insecurity: Food Insecurity Present (04/27/2024)  Housing: Unknown (04/27/2024)  Transportation Needs: No Transportation Needs (04/27/2024)  Utilities: Not At Risk (12/19/2023)  Alcohol  Screen: Low Risk (12/19/2023)  Depression (PHQ2-9): Low Risk (12/19/2023)  Financial Resource Strain: Medium Risk (04/27/2024)  Physical Activity: Inactive (04/27/2024)  Social Connections: Moderately Isolated (04/27/2024)  Stress: Stress Concern Present (04/27/2024)  Tobacco Use: Low Risk (07/10/2024)  Health Literacy: Adequate Health Literacy (12/19/2023)     Readmission Risk Interventions    07/10/2024    2:10 PM 11/25/2023   12:34 PM  Readmission Risk Prevention Plan  Post Dischage Appt Complete   Medication Screening Complete   Transportation Screening Complete Complete  PCP or Specialist Appt within 5-7 Days  Complete  Home Care Screening  Complete  Medication Review (RN CM)  Complete

## 2024-07-10 NOTE — Transfer of Care (Signed)
 Immediate Anesthesia Transfer of Care Note  Patient: Margaret Norman  Procedure(s) Performed: CONVERSION, PREVIOUS HIP SURGERY, TO TOTAL HIP ARTHROPLASTY (Left: Hip)  Patient Location: PACU  Anesthesia Type:Spinal  Level of Consciousness: awake, alert , oriented, and patient cooperative  Airway & Oxygen Therapy: Patient Spontanous Breathing and Patient connected to face mask oxygen  Post-op Assessment: Report given to RN and Post -op Vital signs reviewed and stable  Post vital signs: Reviewed and stable  Last Vitals:  Vitals Value Taken Time  BP 89/46 07/10/24 10:36  Temp    Pulse 84 07/10/24 10:39  Resp 25 07/10/24 10:39  SpO2 100 % 07/10/24 10:39  Vitals shown include unfiled device data.  Last Pain:  Vitals:   07/10/24 0615  TempSrc:   PainSc: 0-No pain         Complications: No notable events documented.

## 2024-07-10 NOTE — H&P (Signed)
 "   PREOPERATIVE H&P  Chief Complaint: osteoarthritis of left hip  HPI: Margaret Norman is a 74 y.o. female who presents for surgical treatment of osteoarthritis of left hip.  She denies any changes in medical history.  Past Surgical History:  Procedure Laterality Date   APPENDECTOMY     During C-Section Birth   BREAST BIOPSY Left 10/2017   BREAST BIOPSY Right 03/2019   BREAST BIOPSY Right 08/26/2023   US  RT BREAST BX W LOC DEV 1ST LESION IMG BX SPEC US  GUIDE 08/26/2023 [facility]   BREAST LUMPECTOMY Right 2020   BREAST LUMPECTOMY WITH RADIOACTIVE SEED AND SENTINEL LYMPH NODE BIOPSY Right 04/15/2019   Procedure: RIGHT BREAST PARTIAL MASTECTOMY WITH RADIOACTIVE SEED AND SENTINEL LYMPH NODE BIOPSY;  Surgeon: Vernetta Berg, MD;  Location: [facility] OR;  Service: General;  Laterality: Right;   CATARACT EXTRACTION Bilateral 06/01/2024   06/22/24   CESAREAN SECTION     x2  w/appy   CHOLECYSTECTOMY     ESOPHAGOGASTRODUODENOSCOPY     FRACTURE SURGERY     HERNIA REPAIR     INTRAMEDULLARY (IM) NAIL INTERTROCHANTERIC Left 11/27/2023   Procedure: LEFT OPEN REDUCTION INTERNAL FIXATION (ORIF) INTERTROCHANTOR;  Surgeon: Jerri Kay HERO, MD;  Location: [facility] OR;  Service: Orthopedics;  Laterality: Left;   KNEE ARTHROSCOPY Right 11/2005   LIVER BIOPSY     PANCREATIC CYST EXCISION     PORT-A-CATH REMOVAL Left 04/25/2020   Procedure: REMOVAL PORT-A-CATH;  Surgeon: Vernetta Berg, MD;  Location: [facility];  Service: General;  Laterality: Left;   PORTACATH PLACEMENT Left 04/15/2019   Procedure: INSERTION PORT-A-CATH WITH ULTRASOUND;  Surgeon: Vernetta Berg, MD;  Location: [facility] OR;  Service: General;  Laterality: Left;   RIGHT/LEFT HEART CATH AND CORONARY ANGIOGRAPHY N/A 09/28/2019   Procedure: RIGHT/LEFT HEART CATH AND CORONARY ANGIOGRAPHY;  Surgeon: Rolan Ezra RAMAN, MD;  Location: [facility];  Service: Cardiovascular;  Laterality: N/A;   SPLENECTOMY      TONSILLECTOMY AND ADENOIDECTOMY     TUBAL LIGATION  06/24/78   Social History   Socioeconomic History   Marital status: Married    Spouse name: Dominick Zertuche   Number of children: 2   Years of education: Not on file   Highest education level: 12th grade  Occupational History   Occupation: CSR    Employer: [employer]   Occupation: retired  Tobacco Use   Smoking status: Never   Smokeless tobacco: Never  Vaping Use   Vaping status: Never Used  Substance and Sexual Activity   Alcohol  use: Yes    Comment: very rarely a glass of wine   Drug use: No   Sexual activity: Not Currently    Birth control/protection: Post-menopausal    Comment: Tubes tied  Other Topics Concern   Not on file  Social History Narrative   Regular Exercise -  NO   Are you right handed or left handed? Right Handed    Are you currently employed ? No    What is your current occupation? Retired   Do you live at home alone? No   Who lives with you? Husband, Daughter, and Darden   What type of home do you live in: 1 story or 2 story? Lives in a one story home       Social Drivers of Health   Tobacco Use: Low Risk (07/10/2024)   Patient History    Smoking Tobacco Use: Never    Smokeless Tobacco Use: Never  Passive Exposure: Not on file  Financial Resource Strain: Medium Risk (04/27/2024)   Overall Financial Resource Strain (CARDIA)    Difficulty of Paying Living Expenses: Somewhat hard  Food Insecurity: Food Insecurity Present (04/27/2024)   Epic    Worried About Programme Researcher, Broadcasting/film/video in the Last Year: Sometimes true    Ran Out of Food in the Last Year: Sometimes true  Transportation Needs: No Transportation Needs (04/27/2024)   Epic    Lack of Transportation (Medical): No    Lack of Transportation (Non-Medical): No  Physical Activity: Inactive (04/27/2024)   Exercise Vital Sign    Days of Exercise per Week: 0 days    Minutes of Exercise per Session: Not on file   Stress: Stress Concern Present (04/27/2024)   [employer] of Occupational Health - Occupational Stress Questionnaire    Feeling of Stress: To some extent  Social Connections: Moderately Isolated (04/27/2024)   Social Connection and Isolation Panel    Frequency of Communication with Friends and Family: More than three times a week    Frequency of Social Gatherings with Friends and Family: Three times a week    Attends Religious Services: Patient declined    Active Member of Clubs or Organizations: No    Attends Banker Meetings: Not on file    Marital Status: Married  Depression (PHQ2-9): Low Risk (12/19/2023)   Depression (PHQ2-9)    PHQ-2 Score: 1  Alcohol  Screen: Low Risk (12/19/2023)   Alcohol  Screen    Last Alcohol  Screening Score (AUDIT): 0  Housing: Unknown (04/27/2024)   Epic    Unable to Pay for Housing in the Last Year: No    Number of Times Moved in the Last Year: Not on file    Homeless in the Last Year: No  Utilities: Not At Risk (12/19/2023)   Epic    Threatened with loss of utilities: No  Health Literacy: Adequate Health Literacy (12/19/2023)   B1300 Health Literacy    Frequency of need for help with medical instructions: Never   Family History  Problem Relation Age of Onset   Liver disease Mother    Dementia Mother    Diabetes Mother        borderline   Depression Mother    Coronary artery disease Father    Heart attack Father    Hypertension Father    Heart disease Father    Early death Father    Cancer Other        leukemia   Stroke Maternal Grandfather    Alcohol  abuse Maternal Grandfather    Hyperlipidemia Brother        Amyloidosis   Cancer Brother    Hypertension Brother    Diabetes Maternal Grandmother    Cancer Paternal Uncle        unknown   Heart attack Paternal Grandmother    Heart attack Paternal Uncle    Hypertension Brother    Diabetes Daughter        borderline   Diabetes Sister     Heart disease Sister    Kidney disease Sister    Heart disease Brother    Cancer Paternal Uncle    Colon cancer Neg Hx    Esophageal cancer Neg Hx    Rectal cancer Neg Hx    Stomach cancer Neg Hx    Allergies  Allergen Reactions   Invokana  [Canagliflozin ] Other (See Comments)    UTI's   Amoxicillin  Other (See Comments)    Sore  tongue  TOLERATED CEFAZOLIN  PRIOR    Prior to Admission medications  Medication Sig Start Date End Date Taking? Authorizing Provider  anastrozole  (ARIMIDEX ) 1 MG tablet Take 1 tablet (1 mg total) by mouth daily. 05/11/24  Yes Odean Potts, MD  aspirin  EC 81 MG tablet Take 162 mg by mouth daily. Swallow whole.   Yes [provider]  B Complex-C (SUPER B COMPLEX PO) Take 1 tablet by mouth daily.   Yes [provider]  BESIVANCE 0.6 % SUSP Place 1 drop into the right eye 4 (four) times daily. 06/02/24  Yes [provider]  Bromfenac Sodium 0.07 % SOLN Place 1 drop into the right eye 2 (two) times daily. 06/03/24  Yes [provider]  buPROPion  (WELLBUTRIN  XL) 300 MG 24 hr tablet Take 1 tablet (300 mg total) by mouth every morning. 03/21/23  Yes Jason Leita Repine, FNP  carvedilol  (COREG ) 6.25 MG tablet Take 1 tablet (6.25 mg total) by mouth 2 (two) times daily with a meal. 04/01/24  Yes Rolan Ezra RAMAN, MD  Cholecalciferol  (VITAMIN D ) 50 MCG (2000 UT) tablet Take 2,000 Units by mouth daily.   Yes [provider]  Dulaglutide  (TRULICITY ) 3 MG/0.5ML SOAJ Inject 3 mg into the skin once a week. 05/15/24  Yes Trixie File, MD  DUREZOL 0.05 % EMUL Place 1 drop into the right eye 4 (four) times daily. 06/02/24  Yes [provider]  ENTRESTO  24-26 MG TAKE 1 TABLET BY MOUTH TWICE DAILY 09/12/23  Yes McLean, Dalton S, MD  gatifloxacin (ZYMAXID) 0.5 % SOLN Place 1 drop into the right eye 4 (four) times daily. 06/03/24  Yes [provider]  insulin  glargine (LANTUS ) 100 UNIT/ML injection Inject  15 Units into the skin daily after breakfast.   Yes [provider]  JARDIANCE  25 MG TABS tablet TAKE 1 TABLET BY MOUTH DAILY 11/12/23  Yes Trixie File, MD  metFORMIN  (GLUCOPHAGE ) 1000 MG tablet TAKE 1 TABLET BY MOUTH TWICE DAILY WITH A MEAL 10/23/23  Yes Trixie File, MD  naproxen sodium (ALEVE) 220 MG tablet Take 220 mg by mouth daily as needed (pain).   Yes [provider]  pantoprazole  (PROTONIX ) 40 MG tablet Take 1 tablet (40 mg total) by mouth 2 (two) times daily. 05/13/24  Yes McElwee, Lauren A, NP  prednisoLONE acetate (PRED FORTE) 1 % ophthalmic suspension Place 1 drop into the right eye 4 (four) times daily. 06/03/24  Yes [provider]  Probiotic Product (PROBIOTIC ADVANCED PO) Take 1 tablet by mouth in the morning and at bedtime.   Yes [provider]  repaglinide  (PRANDIN ) 1 MG tablet Take 1 tablet (1 mg total) by mouth daily before supper. 02/25/24  Yes Trixie File, MD  rosuvastatin  (CRESTOR ) 10 MG tablet TAKE 1 TABLET BY MOUTH DAILY 08/13/23  Yes Jason Leita Repine, FNP  sertraline  (ZOLOFT ) 100 MG tablet Take 1 tablet (100 mg total) by mouth daily. 03/24/24  Yes Webb, Padonda B, FNP  spironolactone  (ALDACTONE ) 25 MG tablet Take 0.5 tablets (12.5 mg total) by mouth daily. 04/09/24  Yes Rolan Ezra RAMAN, MD  Vitamin E 180 MG CAPS Take 180 mg by mouth at bedtime.    Yes [provider]  zinc  gluconate 50 MG tablet Take 50 mg by mouth daily.   Yes [provider]  aspirin  (ASPIRIN  81) 81 MG chewable tablet Chew 1 tablet (81 mg total) by mouth 2 (two) times daily. To be taken after surgery to prevent blood clots 06/24/24  Jule Ronal CROME, PA-C  Continuous Glucose Sensor (FREESTYLE LIBRE 3 PLUS SENSOR) MISC 1 each by Does not apply route every 14 (fourteen) days. 06/30/24   Trixie File, MD  docusate sodium  (COLACE) 100 MG capsule Take 1 capsule (100 mg total) by mouth daily as needed. 06/24/24 06/24/25  Jule Ronal CROME, PA-C  EASY COMFORT PEN NEEDLES 32G X 4 MM MISC USE AS DIRECTED EVERY DAY 05/27/23   Trixie File, MD  furosemide  (LASIX ) 40 MG tablet Take 1 tablet (40 mg total) by mouth as needed. For weight gain of 3ln in 24 hours or 5lb in a week 05/16/23 09/12/24  Rolan Ezra RAMAN, MD  HYDROcodone -acetaminophen  (NORCO/VICODIN) 5-325 MG tablet Take 1 tablet by mouth 3 (three) times daily as needed. To be taken after surgery 06/24/24   Jule Ronal CROME, PA-C  meclizine  (ANTIVERT ) 25 MG tablet Take 25 mg by mouth 3 (three) times daily as needed for dizziness.    [provider]  methocarbamol  (ROBAXIN ) 500 MG tablet Take 1 tablet (500 mg total) by mouth 2 (two) times daily as needed. 06/24/24   Jule Ronal CROME, PA-C  ondansetron  (ZOFRAN ) 4 MG tablet Take 1 tablet (4 mg total) by mouth every 8 (eight) hours as needed for nausea or vomiting. 06/24/24   Jule Ronal CROME, PA-C     Positive ROS: All other systems have been reviewed and were otherwise negative with the exception of those mentioned in the HPI and as above.  Physical Exam: General: Alert, no acute distress Cardiovascular: No pedal edema Respiratory: No cyanosis, no use of accessory musculature GI: abdomen soft Skin: No lesions in the area of chief complaint Neurologic: Sensation intact distally Psychiatric: Patient is competent for consent with normal mood and affect Lymphatic: no lymphedema  MUSCULOSKELETAL: exam stable  Assessment: osteoarthritis of left hip  Plan: Plan for Procedures: CONVERSION, PREVIOUS HIP SURGERY, TO TOTAL HIP ARTHROPLASTY  The risks benefits and alternatives were discussed with the patient including but not limited to the risks of nonoperative treatment, versus surgical intervention including infection, bleeding, nerve injury,  blood clots, cardiopulmonary complications, morbidity, mortality, among others, and they were willing to proceed.   Ozell Cummins, MD 07/10/2024 6:20 AM  "

## 2024-07-10 NOTE — Evaluation (Signed)
 Physical Therapy Evaluation Patient Details Name: Margaret Norman MRN: [id number] DOB: 11-Aug-1950 Today's Date: 07/10/2024  History of Present Illness  Pt s/p L THR conversion from ORIF last year and with hx of CHF, breast CA, DM and ischemic cardiomyopathy  Clinical Impression  Pt admitted as above and presenting with functional mobility limitations 2* decreased L LE strength/ROM, post op pain and PWB on L LE.  Pt motivated and should progress to dc home with family assist.        If plan is discharge home, recommend the following: A little help with walking and/or transfers;A little help with bathing/dressing/bathroom;Assistance with cooking/housework;Assist for transportation;Help with stairs or ramp for entrance   Can travel by private vehicle        Equipment Recommendations None recommended by PT  Recommendations for Other Services  OT consult    Functional Status Assessment Patient has had a recent decline in their functional status and demonstrates the ability to make significant improvements in function in a reasonable and predictable amount of time.     Precautions / Restrictions Precautions Precautions: Fall Restrictions Weight Bearing Restrictions Per Provider Order: Yes LLE Weight Bearing Per Provider Order: Partial weight bearing LLE Partial Weight Bearing Percentage or Pounds: 50%      Mobility  Bed Mobility Overal bed mobility: Needs Assistance Bed Mobility: Supine to Sit     Supine to sit: Min assist, Mod assist     General bed mobility comments: Increased time with cues for sequence and use of R LE to self assist    Transfers Overall transfer level: Needs assistance Equipment used: Rolling walker (2 wheels) Transfers: Sit to/from Stand Sit to Stand: Min assist           General transfer comment: cues for LE management and use of UEs to self assist    Ambulation/Gait Ambulation/Gait assistance: Min assist Gait Distance (Feet): 27  Feet Assistive device: Rolling walker (2 wheels) Gait Pattern/deviations: Step-to pattern, Decreased step length - right, Decreased step length - left, Shuffle, Trunk flexed       General Gait Details: cues for sequence, posture and position from RW - distance ltd by nausea - good compliance with PWB  Stairs            Wheelchair Mobility     Tilt Bed    Modified Rankin (Stroke Patients Only)       Balance Overall balance assessment: Needs assistance Sitting-balance support: No upper extremity supported, Feet supported Sitting balance-Leahy Scale: Fair     Standing balance support: Bilateral upper extremity supported Standing balance-Leahy Scale: Poor                               Pertinent Vitals/Pain Pain Assessment Pain Assessment: 0-10 Pain Score: 4  Pain Location: L hip Pain Descriptors / Indicators: Aching, Sore Pain Intervention(s): Limited activity within patient's tolerance, Monitored during session, Premedicated before session, Ice applied    Home Living Family/patient expects to be discharged to:: Private residence Living Arrangements: Spouse/significant other;Children Available Help at Discharge: Family;Available 24 hours/day Type of Home: House Home Access: Ramped entrance       Home Layout: One level Home Equipment: Cane - single Librarian, Academic (2 wheels);Rollator (4 wheels);Shower seat;Grab bars - tub/shower      Prior Function Prior Level of Function : Independent/Modified Independent             Mobility Comments: use of RW  Extremity/Trunk Assessment   Upper Extremity Assessment Upper Extremity Assessment: Overall WFL for tasks assessed    Lower Extremity Assessment Lower Extremity Assessment: LLE deficits/detail       Communication   Communication Communication: No apparent difficulties    Cognition Arousal: Alert Behavior During Therapy: WFL for tasks assessed/performed   PT - Cognitive  impairments: No apparent impairments                         Following commands: Intact       Cueing Cueing Techniques: Verbal cues     General Comments      Exercises Total Joint Exercises Ankle Circles/Pumps: AROM, Both, 15 reps, Supine   Assessment/Plan    PT Assessment Patient needs continued PT services  PT Problem List Decreased strength;Decreased range of motion;Decreased activity tolerance;Decreased balance;Decreased mobility;Decreased knowledge of use of DME;Pain       PT Treatment Interventions DME instruction;Gait training;Stair training;Functional mobility training;Therapeutic activities;Therapeutic exercise;Patient/family education    PT Goals (Current goals can be found in the Care Plan section)  Acute Rehab PT Goals Patient Stated Goal: Regain IND PT Goal Formulation: With patient Time For Goal Achievement: 07/17/24 Potential to Achieve Goals: Good    Frequency 7X/week     Co-evaluation               AM-PAC PT 6 Clicks Mobility  Outcome Measure Help needed turning from your back to your side while in a flat bed without using bedrails?: A Little Help needed moving from lying on your back to sitting on the side of a flat bed without using bedrails?: A Little Help needed moving to and from a bed to a chair (including a wheelchair)?: A Little Help needed standing up from a chair using your arms (e.g., wheelchair or bedside chair)?: A Little Help needed to walk in hospital room?: A Little Help needed climbing 3-5 steps with a railing? : A Lot 6 Click Score: 17    End of Session Equipment Utilized During Treatment: Gait belt Activity Tolerance: Patient tolerated treatment well;Other (comment) (nausea) Patient left: in chair;with call bell/phone within reach;with chair alarm set;with family/visitor present Nurse Communication: Mobility status PT Visit Diagnosis: Unsteadiness on feet (R26.81);Muscle weakness (generalized) (M62.81);Pain     Time: 1451-1520 PT Time Calculation (min) (ACUTE ONLY): 29 min   Charges:   PT Evaluation $PT Eval Low Complexity: 1 Low PT Treatments $Gait Training: 8-22 mins PT General Charges $$ ACUTE PT VISIT: 1 Visit         [facility] PT Acute Rehabilitation Services Office [phone]   Jiaire Rosebrook 07/10/2024, 3:41 PM

## 2024-07-10 NOTE — Op Note (Signed)
 "  CONVERSION, PREVIOUS HIP SURGERY, TO TOTAL HIP ARTHROPLASTY  Procedure Note Margaret Norman   [id number]  Pre-op Diagnosis: Status post left intertrochanteric femur fracture and ORIF Left hip osteoarthritis     Post-op Diagnosis: same  Operative Findings Healed intertrochanteric femur fracture Severe osteoarthritis Successful removal of prior hardware   Operative Procedures  1.  Conversion of prior left hip ORIF to total hip arthroplasty 2.  Removal of deep hardware from left hip through separate incision  Surgeon: Kay Cummins, M.D.  Assist: Ronal Morna Grave, PA-C   Anesthesia: spinal  Prosthesis: Depuy Acetabulum: Emphasys 48 mm Femur: Actis 4 HO Head: 36 mm size: +1.5 Liner: +4 neutral Bearing Type: ceramic/poly  Explant: Synthes FNS (femoral neck system)  After informed consent was obtained and the operative extremity marked in the holding area, the patient was brought back to the operating room and placed supine on the HANA table. Next, the operative extremity was prepped and draped in normal sterile fashion. Surgical timeout occurred verifying patient identification, surgical site, surgical procedure and administration of antibiotics.  The previous surgical scar was incised and slightly extended proximally and distally.  Dissection was carried down to the IT band which was incised in line with the incision.  Retractors were placed.  The fluoroscopy was brought into help localize the hardware.  The vastus was split in line with its fibers and gross bleeders were coagulated.  A Cobb was used to elevate the muscle off of the hardware.  The head of the screw was visualized and the appropriate screwdriver was used to attempt removal.  However due to the screw being titanium, I found that it had cold welded to the titanium plate.  Power drill was also used to attempt removal but I was unable to do so.  A portion of the screwdriver tip had broken off into the screw head.  This  prevented me from using a different driver therefore I had to use a diamond tipped bur to cut the plate.  Prior to doing so the antirotation screw and bolt were removed from the separate slot.  This came out without any difficulty.  I then carefully used the bur to cut the plate down to the lateral cortex without any damage to the bone.  The sleeve was then removed by hand and this came out easily.  I then used a vice grip to back out the screw with the plate by hand.  This also came out very easily without any damage to the bone.  The surgical site was then thoroughly irrigated with pulsatile lavage.  We then turned our attention to the total hip arthroplasty portion of the surgery. A separate 10 cm longitudinal incision was made starting from 2 fingerbreadths lateral and inferior to the ASIS towards the lateral aspect of the patella.  A Hueter approach to the hip was performed, using the interval between tensor fascia lata and sartorius.  Dissection was carried bluntly down onto the anterior hip capsule. The lateral femoral circumflex vessels were identified and coagulated. A capsulotomy was performed and the capsular flaps tagged for later repair.  The capsule was scarred from the previous hip fracture.  The neck osteotomy was performed 1 fingerbreadth above the lesser trochanter. The femoral head was removed which showed severe degenerative wear of the cartilage.  The intertrochanteric fracture was fully united.  The acetabular rim was cleared of soft tissue and osteophytes and attention was turned to reaming the acetabulum.  Sequential reaming was performed  under fluoroscopic guidance down to the floor of the cotyloid fossa. We reamed to a size 48 mm, and then impacted the acetabular shell in 45 degrees of inclination and 15 degrees of anteversion under fluoroscopy. A 25 mm cancellous screw was placed to secure the shell.  A +4 neutral liner was then placed after irrigation and attention turned to the femur.   After placing the femoral hook, the leg was taken to externally rotated, extended and adducted position taking care to perform soft tissue releases to allow for adequate mobilization of the femur. Soft tissue was cleared from the shoulder of the greater trochanter and the hook elevator used to improve exposure of the proximal femur.  Lateral bone from the shoulder was rasped away for relief.  Sequential broaching performed up to a size 4.  High offset trial neck and +1.5 head were placed. The leg was brought back up to neutral and the construct reduced.  The position and sizing of components, offset and leg lengths were checked using fluoroscopy.  Based on fluoroscopic and clinical findings, I felt that she was in between head ball lengths but I was happy with her offset.  Stability of the construct was checked in 45 degrees of hip extension and 90 degrees of external rotation without any subluxation, shuck or impingement of prosthesis. We dislocated the prosthesis, dropped the leg back into position, removed trial components, and irrigated copiously.  Bone graft from the acetabular reamings was used to pack the holes in the lateral femoral cortex from the hardware removal.  The final stem was inserted down the femoral canal and I left it slightly more proud in order to give her a little more length.  A +1.5 head was chosen then placed, the leg brought back up, the system reduced and fluoroscopy used to verify positioning.  Antibiotic irrigation was placed in the surgical wound.   We irrigated, obtained hemostasis and closed the capsule using #2 ethibond suture.  A topical mixture of 0.25% bupivacaine  and meloxicam  was placed deep to the fascia.  One gram of vancomycin  powder was placed in the surgical bed.   One gram of topical tranexamic acid  was injected into the joint.  The fascia was closed with #1 stratafix, the deep fat layer was closed with 0 vicryl, the subcutaneous layers closed with 2.0 Vicryl Plus and  the skin closed with 2-0 nylon and Dermabond.  The first incision was closed with #1 Vicryl for the IT band, 0 Vicryl for subcutaneous fat, 2-0 Vicryl and 2-0 nylon.  A sterile dressing was applied. The patient was awakened in the operating room and taken to recovery in stable condition.  All sponge, needle, and instrument counts were correct at the end of the case.   Morna Grave, my PA, was a medical necessity for opening, closing, limb positioning, retracting, exposing, and overall facilitation and timely completion of the surgery.  Position: supine  Complications: see description of procedure.  Time Out: performed   Drains/Packing: none  Estimated blood loss: see anesthesia record  Returned to Recovery Room: in good condition.   Antibiotics: yes   Mechanical VTE (DVT) Prophylaxis: sequential compression devices, TED thigh-high  Chemical VTE (DVT) Prophylaxis: Aspirin   Fluid Replacement: see anesthesia record  Specimens Removed: 1 to pathology   Sponge and Instrument Count Correct? yes   PACU: portable radiograph - low AP   Plan/RTC: Return in 2 weeks for suture removal. Weight Bearing/Load Lower Extremity: 50% partial weightbearing Hip precautions: none Suture Removal: 2  weeks   N. Ozell Cummins, MD Margaret Norman 10:10 AM   Implant Name Type Inv. Item Serial No. Manufacturer Lot No. LRB No. Used Action  SCREW LOCK FT STRV 5X34 ST - [id number] Screw SCREW LOCK FT STRV 5X34 ST  [employer] [id number] Left 1 Explanted & Discarded  CUP ACET EMPHA 48 CEMENTLESS - [id number] Cup CUP ACET EMPHA 48 CEMENTLESS  [employer] [id number] Left 1 Implanted  SCREW 6.5MMX25MM - [id number] Screw SCREW 6.5MMX25MM  [employer] [id number] Left 1 Implanted  LINER ACET EMPHA 36X48 AOX POL - [id number] Liner LINER ACET EMPHA 36X48 AOX POL  [employer] [id number] Left 1 Implanted  FEMORAL STEM 12/14 TPR SZ4 HIP - [id number] Orthopedic Implant FEMORAL STEM 12/14 TPR SZ4  HIP  [employer] [id number] Left 1 Implanted  HEAD CERAMIC DELTA 36 PLUS 1.5 - [id number] Hips HEAD CERAMIC DELTA 36 PLUS 1.5  [employer] [id number] Left 1 Implanted   "

## 2024-07-10 NOTE — Anesthesia Postprocedure Evaluation (Signed)
"   Anesthesia Post Note  Patient: Margaret Norman  Procedure(s) Performed: CONVERSION, PREVIOUS HIP SURGERY, TO TOTAL HIP ARTHROPLASTY (Left: Hip)     Patient location during evaluation: PACU Anesthesia Type: Spinal Level of consciousness: oriented and awake and alert Pain management: pain level controlled Vital Signs Assessment: post-procedure vital signs reviewed and stable Respiratory status: spontaneous breathing, respiratory function stable and patient connected to nasal cannula oxygen Cardiovascular status: blood pressure returned to baseline and stable Postop Assessment: no headache, no backache and no apparent nausea or vomiting Anesthetic complications: no   No notable events documented.  Last Vitals:  Vitals:   07/10/24 1132 07/10/24 1145  BP: (!) 91/49 (!) 94/50  Pulse: 79 77  Resp: 11 16  Temp:    SpO2: 95% 95%    Last Pain:  Vitals:   07/10/24 1145  TempSrc:   PainSc: 0-No pain                 Rome Ade      "

## 2024-07-11 LAB — GLUCOSE, CAPILLARY
Glucose-Capillary: 134 mg/dL — ABNORMAL HIGH (ref 70–99)
Glucose-Capillary: 263 mg/dL — ABNORMAL HIGH (ref 70–99)

## 2024-07-11 NOTE — Progress Notes (Signed)
" ° °  Subjective:  Patient reports pain as mild.  Doing well with PT  Today's  total administered Morphine  Milligram Equivalents: 10  Objective:   VITALS:   Vitals:   07/10/24 2050 07/11/24 0251 07/11/24 0607 07/11/24 0744  BP: (!) 98/51 (!) 102/53 (!) 103/50 (!) 103/47  Pulse: 91 81 88 87  Resp: 17 16 15 16   Temp: 98.1 F (36.7 C) 97.9 F (36.6 C) 98.2 F (36.8 C) 98 F (36.7 C)  TempSrc: Oral Oral Oral Oral  SpO2: 97% 95% 96% 94%  Weight:      Height:        Sensation intact distally Intact pulses distally Dorsiflexion/Plantar flexion intact Incision: dressing C/D/I and no drainage   Lab Results  Component Value Date   WBC 15.2 (H) 07/01/2024   HGB 11.4 (L) 07/01/2024   HCT 36.4 07/01/2024   MCV 92.2 07/01/2024   PLT 508 (H) 07/01/2024     Assessment/Plan:  1 Day Post-Op   - Expected postop acute blood loss anemia - Up with PT/OT - DVT ppx - SCDs, ambulation, aspirin  - 50% PWB operative extremity - Pain controlled - Discharge planning - home today if she clears PT  Margaret Norman 07/11/2024, 10:10 AM  "

## 2024-07-11 NOTE — Plan of Care (Signed)
   Problem: Coping: Goal: Level of anxiety will decrease Outcome: Progressing   Problem: Pain Managment: Goal: General experience of comfort will improve and/or be controlled Outcome: Progressing

## 2024-07-11 NOTE — Progress Notes (Signed)
 Physical Therapy Treatment Patient Details Name: Margaret Norman MRN: [id number] DOB: 1950-08-13 Today's Date: 07/11/2024   History of Present Illness Pt s/p L direct anterior THR conversion from ORIF last year and with hx of CHF, breast CA, DM and ischemic cardiomyopathy    PT Comments  Pt continues very motivated and progressing well with mobility including up to ambulate increased distance in hall with noted improved stability, reviewed car transfers and HEP initiated.  Pt eager for dc home this date.    If plan is discharge home, recommend the following: A little help with walking and/or transfers;A little help with bathing/dressing/bathroom;Assistance with cooking/housework;Assist for transportation;Help with stairs or ramp for entrance   Can travel by private vehicle        Equipment Recommendations  None recommended by PT    Recommendations for Other Services OT consult     Precautions / Restrictions Precautions Precautions: Fall Recall of Precautions/Restrictions: Intact Restrictions Weight Bearing Restrictions Per Provider Order: Yes LLE Weight Bearing Per Provider Order: Partial weight bearing LLE Partial Weight Bearing Percentage or Pounds: 50     Mobility  Bed Mobility               General bed mobility comments: Pt up in chair and requests back to same    Transfers Overall transfer level: Needs assistance Equipment used: Rolling walker (2 wheels) Transfers: Sit to/from Stand Sit to Stand: Supervision           General transfer comment: cues for LE management and use of UEs to self assist    Ambulation/Gait Ambulation/Gait assistance: Contact guard assist, Supervision Gait Distance (Feet): 75 Feet Assistive device: Rolling walker (2 wheels) Gait Pattern/deviations: Step-to pattern, Decreased step length - right, Decreased step length - left, Shuffle, Trunk flexed       General Gait Details: cues for sequence, posture and position from RW -  good compliance with PWB   Stairs             Wheelchair Mobility     Tilt Bed    Modified Rankin (Stroke Patients Only)       Balance Overall balance assessment: Needs assistance Sitting-balance support: No upper extremity supported, Feet supported Sitting balance-Leahy Scale: Good     Standing balance support: During functional activity, No upper extremity supported Standing balance-Leahy Scale: Fair                              Hotel Manager: No apparent difficulties  Cognition Arousal: Alert Behavior During Therapy: WFL for tasks assessed/performed   PT - Cognitive impairments: No apparent impairments                         Following commands: Intact      Cueing Cueing Techniques: Verbal cues  Exercises Total Joint Exercises Ankle Circles/Pumps: AROM, Both, 15 reps, Supine Quad Sets: AROM, Both, 10 reps, Supine Heel Slides: AAROM, Left, 20 reps, Supine Hip ABduction/ADduction: AAROM, Left, 15 reps, Supine    General Comments        Pertinent Vitals/Pain Pain Assessment Pain Assessment: 0-10 Pain Score: 3  Pain Location: L hip Pain Descriptors / Indicators: Aching, Sore Pain Intervention(s): Limited activity within patient's tolerance, Monitored during session, Premedicated before session, Ice applied    Home Living Family/patient expects to be discharged to:: Private residence Living Arrangements: Spouse/significant other;Children Available Help at Discharge: Family;Available 24 hours/day Type  of Home: House Home Access: Ramped entrance       Home Layout: One level Home Equipment: Cane - single Librarian, Academic (2 wheels);Rollator (4 wheels);Shower seat;Grab bars - tub/shower;BSC/3in1      Prior Function            PT Goals (current goals can now be found in the care plan section) Acute Rehab PT Goals Patient Stated Goal: Regain IND PT Goal Formulation: With patient Time For  Goal Achievement: 07/17/24 Potential to Achieve Goals: Good Progress towards PT goals: Progressing toward goals    Frequency    7X/week      PT Plan      Co-evaluation              AM-PAC PT 6 Clicks Mobility   Outcome Measure  Help needed turning from your back to your side while in a flat bed without using bedrails?: A Little Help needed moving from lying on your back to sitting on the side of a flat bed without using bedrails?: A Little Help needed moving to and from a bed to a chair (including a wheelchair)?: A Little Help needed standing up from a chair using your arms (e.g., wheelchair or bedside chair)?: A Little Help needed to walk in hospital room?: A Little Help needed climbing 3-5 steps with a railing? : A Lot 6 Click Score: 17    End of Session Equipment Utilized During Treatment: Gait belt Activity Tolerance: Patient tolerated treatment well;Other (comment) Patient left: in chair;with call bell/phone within reach;with chair alarm set;with family/visitor present Nurse Communication: Mobility status PT Visit Diagnosis: Unsteadiness on feet (R26.81);Muscle weakness (generalized) (M62.81);Pain     Time: 1030-1056 PT Time Calculation (min) (ACUTE ONLY): 26 min  Charges:    $Gait Training: 8-22 mins $Therapeutic Exercise: 8-22 mins PT General Charges $$ ACUTE PT VISIT: 1 Visit                     [facility] PT Acute Rehabilitation Services Office [phone]    Karita Dralle 07/11/2024, 12:07 PM

## 2024-07-11 NOTE — Plan of Care (Signed)
 " Problem: Bowel/Gastric: Goal: Gastrointestinal status for postoperative course will improve Outcome: Adequate for Discharge   Problem: Cardiac: Goal: Ability to maintain an adequate cardiac output Outcome: Adequate for Discharge Goal: Will show no evidence of cardiac arrhythmias Outcome: Adequate for Discharge   Problem: Nutritional: Goal: Will attain and maintain optimal nutritional status Outcome: Adequate for Discharge   Problem: Neurological: Goal: Will regain or maintain usual level of consciousness Outcome: Adequate for Discharge   Problem: Clinical Measurements: Goal: Ability to maintain clinical measurements within normal limits Outcome: Adequate for Discharge Goal: Postoperative complications will be avoided or minimized Outcome: Adequate for Discharge   Problem: Respiratory: Goal: Will regain and/or maintain adequate ventilation Outcome: Adequate for Discharge Goal: Respiratory status will improve Outcome: Adequate for Discharge   Problem: Skin Integrity: Goal: Demonstrates signs of wound healing without infection Outcome: Adequate for Discharge   Problem: Urinary Elimination: Goal: Will remain free from infection Outcome: Adequate for Discharge Goal: Ability to achieve and maintain adequate urine output Outcome: Adequate for Discharge   Problem: Education: Goal: Knowledge of General Education information will improve Description: Including pain rating scale, medication(s)/side effects and non-pharmacologic comfort measures Outcome: Adequate for Discharge   Problem: Health Behavior/Discharge Planning: Goal: Ability to manage health-related needs will improve Outcome: Adequate for Discharge   Problem: Clinical Measurements: Goal: Ability to maintain clinical measurements within normal limits will improve Outcome: Adequate for Discharge Goal: Will remain free from infection Outcome: Adequate for Discharge Goal: Diagnostic test results will  improve Outcome: Adequate for Discharge Goal: Respiratory complications will improve Outcome: Adequate for Discharge Goal: Cardiovascular complication will be avoided Outcome: Adequate for Discharge   Problem: Activity: Goal: Risk for activity intolerance will decrease Outcome: Adequate for Discharge   Problem: Nutrition: Goal: Adequate nutrition will be maintained Outcome: Adequate for Discharge   Problem: Coping: Goal: Level of anxiety will decrease Outcome: Adequate for Discharge   Problem: Elimination: Goal: Will not experience complications related to bowel motility Outcome: Adequate for Discharge Goal: Will not experience complications related to urinary retention Outcome: Adequate for Discharge   Problem: Pain Managment: Goal: General experience of comfort will improve and/or be controlled Outcome: Adequate for Discharge   Problem: Safety: Goal: Ability to remain free from injury will improve Outcome: Adequate for Discharge   Problem: Skin Integrity: Goal: Risk for impaired skin integrity will decrease Outcome: Adequate for Discharge   Problem: Education: Goal: Ability to describe self-care measures that may prevent or decrease complications (Diabetes Survival Skills Education) will improve Outcome: Adequate for Discharge Goal: Individualized Educational Video(s) Outcome: Adequate for Discharge   Problem: Coping: Goal: Ability to adjust to condition or change in health will improve Outcome: Adequate for Discharge   Problem: Fluid Volume: Goal: Ability to maintain a balanced intake and output will improve Outcome: Adequate for Discharge   Problem: Health Behavior/Discharge Planning: Goal: Ability to identify and utilize available resources and services will improve Outcome: Adequate for Discharge Goal: Ability to manage health-related needs will improve Outcome: Adequate for Discharge   Problem: Metabolic: Goal: Ability to maintain appropriate glucose  levels will improve Outcome: Adequate for Discharge   Problem: Nutritional: Goal: Maintenance of adequate nutrition will improve Outcome: Adequate for Discharge Goal: Progress toward achieving an optimal weight will improve Outcome: Adequate for Discharge   Problem: Skin Integrity: Goal: Risk for impaired skin integrity will decrease Outcome: Adequate for Discharge   Problem: Tissue Perfusion: Goal: Adequacy of tissue perfusion will improve Outcome: Adequate for Discharge   Problem: Education: Goal: Knowledge of  the prescribed therapeutic regimen will improve Outcome: Adequate for Discharge Goal: Understanding of discharge needs will improve Outcome: Adequate for Discharge Goal: Individualized Educational Video(s) Outcome: Adequate for Discharge   Problem: Activity: Goal: Ability to avoid complications of mobility impairment will improve Outcome: Adequate for Discharge Goal: Ability to tolerate increased activity will improve Outcome: Adequate for Discharge   Problem: Clinical Measurements: Goal: Postoperative complications will be avoided or minimized Outcome: Adequate for Discharge   Problem: Pain Management: Goal: Pain level will decrease with appropriate interventions Outcome: Adequate for Discharge   "

## 2024-07-11 NOTE — Discharge Summary (Signed)
 "    Patient ID: Margaret Norman MRN: [id number] DOB/AGE: 75/18/1952 75 y.o.  Admit date: 07/10/2024 Discharge date: 07/11/2024  Admission Diagnoses:  Primary osteoarthritis of left hip  Discharge Diagnoses:  Principal Problem:   Primary osteoarthritis of left hip Active Problems:   Nondisplaced intertrochanteric fracture of left femur, initial encounter for closed fracture [facility])   Status post total replacement of left hip   Past Medical History:  Diagnosis Date   Allergy AMOXICILLAN   Anxiety    Arthritis    Breast cancer (HCC) 03/05/2019   Cataract    Chest pain    Nuclear, adenosine ,  December, 2013, low risk nuclear scan with small, moderate in intensity, fixed anteroseptal defect. This is possibly related to an LBBB versus small prior infarct. 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 female who was admitted 07/10/2024 with a diagnosis of Primary osteoarthritis of left hip and went to the operating room on 07/10/2024 and underwent the above named procedures.    She was given perioperative antibiotics:  Anti-infectives (From admission, onward)    Start     Dose/Rate Route Frequency Ordered Stop   07/11/24 1000  doxycycline  (VIBRA -TABS) tablet 100 mg        100 mg Oral 2 times daily 07/10/24 1346     07/10/24 1400  ceFAZolin  (ANCEF ) IVPB 2g/100 mL premix        2  g 200 mL/hr over 30 Minutes Intravenous Every 6 hours 07/10/24 1346 07/11/24 0734   07/10/24 0821  vancomycin  (VANCOCIN ) powder  Status:  Discontinued          As needed 07/10/24 0821 07/10/24 1032   07/10/24 0600  ceFAZolin  (ANCEF ) IVPB 2g/100 mL premix        2 g 200 mL/hr over 30 Minutes Intravenous On call to O.R. 07/10/24 9449 07/10/24 0755     .  She was given sequential compression devices, early ambulation, and appropriate chemoprophylaxis for DVT prophylaxis.  She benefited maximally from the hospital stay and there were no complications.    Recent vital signs:  Vitals:   07/11/24 0607 07/11/24 0744  BP: (!) 103/50 (!) 103/47  Pulse: 88 87  Resp: 15 16  Temp: 98.2 F (36.8 C) 98 F (36.7 C)  SpO2: 96% 94%    Recent laboratory studies:  Lab Results  Component Value Date   HGB 11.4 (L) 07/01/2024   HGB 10.5 (L) 04/08/2024   HGB 10.7 (L) 04/08/2024   Lab Results  Component Value Date   WBC 15.2 (H) 07/01/2024   PLT 508 (H) 07/01/2024   Lab Results  Component Value Date   INR 1.1 09/25/2019   Lab Results  Component Value Date   NA 144 07/01/2024   K 5.0 07/01/2024   CL 108 07/01/2024   CO2 24 07/01/2024  BUN 18 07/01/2024   CREATININE 1.13 (H) 07/01/2024   GLUCOSE 114 (H) 07/01/2024    Discharge Medications:   Allergies as of 07/11/2024       Reactions   Invokana  [canagliflozin ] Other (See Comments)   UTI's   Amoxicillin  Other (See Comments)   Sore tongue TOLERATED CEFAZOLIN  PRIOR        Medication List     STOP taking these medications    naproxen sodium 220 MG tablet Commonly known as: ALEVE       TAKE these medications    anastrozole  1 MG tablet Commonly known as: ARIMIDEX  Take 1 tablet (1 mg total) by mouth daily.   aspirin  81 MG chewable tablet Commonly known as: Aspirin  81 Chew 1 tablet (81 mg total) by mouth 2 (two) times daily. To be taken after surgery to prevent blood clots What changed: Another medication with the  same name was removed. Continue taking this medication, and follow the directions you see here.   Besivance 0.6 % Susp Generic drug: Besifloxacin HCl Place 1 drop into the right eye 4 (four) times daily.   Bromfenac Sodium 0.07 % Soln Place 1 drop into the right eye 2 (two) times daily.   buPROPion  300 MG 24 hr tablet Commonly known as: WELLBUTRIN  XL Take 1 tablet (300 mg total) by mouth every morning.   carvedilol  6.25 MG tablet Commonly known as: COREG  Take 1 tablet (6.25 mg total) by mouth 2 (two) times daily with a meal.   docusate sodium  100 MG capsule Commonly known as: Colace Take 1 capsule (100 mg total) by mouth daily as needed.   doxycycline  100 MG capsule Commonly known as: Vibramycin  Take 1 capsule (100 mg total) by mouth 2 (two) times daily.   Durezol 0.05 % Emul Generic drug: Difluprednate Place 1 drop into the right eye 4 (four) times daily.   Easy Comfort Pen Needles 32G X 4 MM Misc Generic drug: Insulin  Pen Needle USE AS DIRECTED EVERY DAY   Entresto  24-26 MG Generic drug: sacubitril -valsartan  TAKE 1 TABLET BY MOUTH TWICE DAILY   FreeStyle Libre 3 Plus Sensor Misc 1 each by Does not apply route every 14 (fourteen) days.   furosemide  40 MG tablet Commonly known as: LASIX  Take 1 tablet (40 mg total) by mouth as needed. For weight gain of 3ln in 24 hours or 5lb in a week   gatifloxacin 0.5 % Soln Commonly known as: ZYMAXID Place 1 drop into the right eye 4 (four) times daily.   HYDROcodone -acetaminophen  5-325 MG tablet Commonly known as: NORCO/VICODIN Take 1 tablet by mouth 3 (three) times daily as needed. To be taken after surgery   insulin  glargine 100 UNIT/ML injection Commonly known as: LANTUS  Inject 15 Units into the skin daily after breakfast.   Jardiance  25 MG Tabs tablet Generic drug: empagliflozin  TAKE 1 TABLET BY MOUTH DAILY   meclizine  25 MG tablet Commonly known as: ANTIVERT  Take 25 mg by mouth 3 (three) times daily as needed for  dizziness.   metFORMIN  1000 MG tablet Commonly known as: GLUCOPHAGE  TAKE 1 TABLET BY MOUTH TWICE DAILY WITH A MEAL   methocarbamol  500 MG tablet Commonly known as: ROBAXIN  Take 1 tablet (500 mg total) by mouth 2 (two) times daily as needed.   ondansetron  4 MG tablet Commonly known as: Zofran  Take 1 tablet (4 mg total) by mouth every 8 (eight) hours as needed for nausea or vomiting.   pantoprazole  40 MG tablet Commonly known as: PROTONIX  Take 1 tablet (  40 mg total) by mouth 2 (two) times daily.   prednisoLONE acetate 1 % ophthalmic suspension Commonly known as: PRED FORTE Place 1 drop into the right eye 4 (four) times daily.   PROBIOTIC ADVANCED PO Take 1 tablet by mouth in the morning and at bedtime.   repaglinide  1 MG tablet Commonly known as: PRANDIN  Take 1 tablet (1 mg total) by mouth daily before supper.   rosuvastatin  10 MG tablet Commonly known as: CRESTOR  TAKE 1 TABLET BY MOUTH DAILY   sertraline  100 MG tablet Commonly known as: ZOLOFT  Take 1 tablet (100 mg total) by mouth daily.   spironolactone  25 MG tablet Commonly known as: ALDACTONE  Take 0.5 tablets (12.5 mg total) by mouth daily.   SUPER B COMPLEX PO Take 1 tablet by mouth daily.   Trulicity  3 MG/0.5ML Soaj Generic drug: Dulaglutide  Inject 3 mg into the skin once a week.   Vitamin D  50 MCG (2000 UT) tablet Take 2,000 Units by mouth daily.   Vitamin E 180 MG Caps Take 180 mg by mouth at bedtime.   zinc  gluconate 50 MG tablet Take 50 mg by mouth daily.               Durable Medical Equipment  (From admission, onward)           Start     Ordered   07/10/24 1347  DME Walker rolling  Once       Question:  Patient needs a walker to treat with the following condition  Answer:  History of hip replacement   07/10/24 1346   07/10/24 1347  DME 3 n 1  Once        07/10/24 1346   07/10/24 1347  DME Bedside commode  Once       Question:  Patient needs a bedside commode to treat with the  following condition  Answer:  History of hip replacement   07/10/24 1346            Diagnostic Studies: DG Pelvis Portable Result Date: 07/10/2024 CLINICAL DATA:  Status post left hip replacement. EXAM: PORTABLE PELVIS 1-2 VIEWS COMPARISON:  Preoperative imaging FINDINGS: Previous proximal left femoral hardware has been removed. New left hip arthroplasty in expected alignment. No periprosthetic lucency or fracture. Recent postsurgical change includes air and edema in the soft tissues. IMPRESSION: Left hip arthroplasty without immediate postoperative complication. Electronically Signed   By: Andrea Gasman M.D.   On: 07/10/2024 11:48   DG HIP UNILAT WITH PELVIS 1V LEFT Result Date: 07/10/2024 CLINICAL DATA:  Elective surgery. EXAM: DG HIP (WITH OR WITHOUT PELVIS) 1V*L* COMPARISON:  03/26/2024 FINDINGS: Eleven fluoroscopic spot views of the pelvis and left hip obtained in the operating room. Sequential images during removal of previous hardware and subsequent hip arthroplasty. Fluoroscopy time 24 seconds. Dose 1.87 mGy. IMPRESSION: Intraoperative fluoroscopy during left hip arthroplasty. Electronically Signed   By: Andrea Gasman M.D.   On: 07/10/2024 11:47   DG C-Arm 1-60 Min-No Report Result Date: 07/10/2024 Fluoroscopy was utilized by the requesting physician.  No radiographic interpretation.   DG C-Arm 1-60 Min-No Report Result Date: 07/10/2024 Fluoroscopy was utilized by the requesting physician.  No radiographic interpretation.    Disposition: Discharge disposition: 01-Home or Self Care       Discharge Instructions     Call MD / Call 911   Complete by: As directed    If you experience chest pain or shortness of breath, CALL 911 and be transported to the  hospital emergency room.  If you develope a fever above 101.5 F, pus (white drainage) or increased drainage or redness at the wound, or calf pain, call your surgeon's office.   Constipation Prevention   Complete by: As  directed    Drink plenty of fluids.  Prune juice may be helpful.  You may use a stool softener, such as Colace (over the counter) 100 mg twice a day.  Use MiraLax  (over the counter) for constipation as needed.   Driving restrictions   Complete by: As directed    No driving while taking narcotic pain meds.   Increase activity slowly as tolerated   Complete by: As directed    Post-operative opioid taper instructions:   Complete by: As directed    POST-OPERATIVE OPIOID TAPER INSTRUCTIONS: It is important to wean off of your opioid medication as soon as possible. If you do not need pain medication after your surgery it is ok to stop day one. Opioids include: Codeine, Hydrocodone (Norco, Vicodin), Oxycodone (Percocet, oxycontin ) and hydromorphone  amongst others.  Long term and even short term use of opiods can cause: Increased pain response Dependence Constipation Depression Respiratory depression And more.  Withdrawal symptoms can include Flu like symptoms Nausea, vomiting And more Techniques to manage these symptoms Hydrate well Eat regular healthy meals Stay active Use relaxation techniques(deep breathing, meditating, yoga) Do Not substitute Alcohol  to help with tapering If you have been on opioids for less than two weeks and do not have pain than it is ok to stop all together.  Plan to wean off of opioids This plan should start within one week post op of your joint replacement. Maintain the same interval or time between taking each dose and first decrease the dose.  Cut the total daily intake of opioids by one tablet each day Next start to increase the time between doses. The last dose that should be eliminated is the evening dose.           Follow-up Information     Jule Ronal CROME, PA-C. Schedule an appointment as soon as possible for a visit in 2 week(s).   Specialty: Orthopedic Surgery Contact information: [address] [place] [address] [phone]                   Signed: Ozell Cummins 07/11/2024, 10:10 AM  "

## 2024-07-11 NOTE — Evaluation (Signed)
 Occupational Therapy Evaluation and Discharge Patient Details Name: Margaret Norman MRN: [id number] DOB: 05/31/1951 Today's Date: 07/11/2024   History of Present Illness   Pt s/p L direct anterior THR conversion from ORIF last year and with hx of CHF, breast CA, DM and ischemic cardiomyopathy     Clinical Impressions Pt presents with L hip post op soreness and impaired balance with 50% WB status of L LE requiring RW. Pt is able to come to EOB without assist, stand and ambulate with supervision. Pt needs up to min assist for ADLs and has assistance of her family at home. Educated in compensatory strategies for ADLs and transfer to her walk-in shower adhering to WB status. Pt is hopeful to discharge home today. No further OT needs.      If plan is discharge home, recommend the following:   A little help with bathing/dressing/bathroom;Assistance with cooking/housework;Assist for transportation;Help with stairs or ramp for entrance     Functional Status Assessment   Patient has had a recent decline in their functional status and demonstrates the ability to make significant improvements in function in a reasonable and predictable amount of time.     Equipment Recommendations   None recommended by OT     Recommendations for Other Services         Precautions/Restrictions   Precautions Precautions: Fall Recall of Precautions/Restrictions: Intact Restrictions Weight Bearing Restrictions Per Provider Order: Yes LLE Weight Bearing Per Provider Order: Partial weight bearing LLE Partial Weight Bearing Percentage or Pounds: 50     Mobility Bed Mobility Overal bed mobility: Modified Independent             General bed mobility comments: HOB up, increased time supine, educated in use of gait belt to assist L LE back into bed    Transfers Overall transfer level: Needs assistance Equipment used: Rolling walker (2 wheels) Transfers: Sit to/from Stand Sit to Stand:  Supervision                  Balance Overall balance assessment: Needs assistance   Sitting balance-Leahy Scale: Good     Standing balance support: During functional activity, No upper extremity supported Standing balance-Leahy Scale: Fair Standing balance comment: RW for ambulation, fair balance at sink                           ADL either performed or assessed with clinical judgement   ADL Overall ADL's : Needs assistance/impaired Eating/Feeding: Independent;Sitting   Grooming: Oral care;Brushing hair;Standing;Supervision/safety   Upper Body Bathing: Set up;Sitting   Lower Body Bathing: Minimal assistance;Sit to/from stand   Upper Body Dressing : Set up;Sitting   Lower Body Dressing: Minimal assistance;Sit to/from stand   Toilet Transfer: Supervision/safety;Ambulation;Rolling walker (2 wheels)           Functional mobility during ADLs: Supervision/safety;Rolling walker (2 wheels)       Vision Baseline Vision/History: 1 Wears glasses Ability to See in Adequate Light: 0 Adequate Patient Visual Report: No change from baseline       Perception         Praxis         Pertinent Vitals/Pain Pain Assessment Pain Assessment: Faces Faces Pain Scale: Hurts a little bit Pain Location: L hip Pain Descriptors / Indicators: Aching, Sore Pain Intervention(s): Monitored during session, Repositioned, Ice applied     Extremity/Trunk Assessment Upper Extremity Assessment Upper Extremity Assessment: Overall WFL for tasks assessed;Right hand dominant  Lower Extremity Assessment Lower Extremity Assessment: Defer to PT evaluation   Cervical / Trunk Assessment Cervical / Trunk Assessment: Normal   Communication Communication Communication: No apparent difficulties   Cognition Arousal: Alert Behavior During Therapy: WFL for tasks assessed/performed Cognition: No apparent impairments                               Following commands:  Intact       Cueing  General Comments   Cueing Techniques: Verbal cues      Exercises     Shoulder Instructions      Home Living Family/patient expects to be discharged to:: Private residence Living Arrangements: Spouse/significant other;Children Available Help at Discharge: Family;Available 24 hours/day Type of Home: House Home Access: Ramped entrance     Home Layout: One level     Bathroom Shower/Tub: Arts Development Officer Toilet: Handicapped height     Home Equipment: Cane - single Librarian, Academic (2 wheels);Rollator (4 wheels);Shower seat;Grab bars - tub/shower;BSC/3in1          Prior Functioning/Environment Prior Level of Function : Independent/Modified Independent                    OT Problem List: Impaired balance (sitting and/or standing)   OT Treatment/Interventions:        OT Goals(Current goals can be found in the care plan section)   Acute Rehab OT Goals OT Goal Formulation: With patient Time For Goal Achievement: 07/25/24 Potential to Achieve Goals: Good   OT Frequency:       Co-evaluation              AM-PAC OT 6 Clicks Daily Activity     Outcome Measure Help from another person eating meals?: None Help from another person taking care of personal grooming?: A Little Help from another person toileting, which includes using toliet, bedpan, or urinal?: A Little Help from another person bathing (including washing, rinsing, drying)?: A Little Help from another person to put on and taking off regular upper body clothing?: A Little Help from another person to put on and taking off regular lower body clothing?: A Little 6 Click Score: 19   End of Session Equipment Utilized During Treatment: Gait belt;Rolling walker (2 wheels)  Activity Tolerance: Patient tolerated treatment well Patient left: in chair;with call bell/phone within reach  OT Visit Diagnosis: Unsteadiness on feet (R26.81);Other abnormalities of gait  and mobility (R26.89);Pain Pain - Right/Left: Left Pain - part of body: Hip                Time: 9071-9055 OT Time Calculation (min): 16 min Charges:  OT General Charges $OT Visit: 1 Visit OT Evaluation $OT Eval Low Complexity: 1 Low  Mliss HERO, OTR/L Acute Rehabilitation Services Office: [phone]   Kennth Mliss Helling 07/11/2024, 9:54 AM

## 2024-07-12 ENCOUNTER — Encounter: Payer: Self-pay | Admitting: Orthopaedic Surgery

## 2024-07-13 ENCOUNTER — Encounter (HOSPITAL_COMMUNITY): Payer: Self-pay | Admitting: Orthopaedic Surgery

## 2024-07-13 NOTE — Telephone Encounter (Signed)
 Ahh, ok.  Thank you Margaret Norman!

## 2024-07-13 NOTE — Telephone Encounter (Signed)
 Thank you :)

## 2024-07-13 NOTE — Telephone Encounter (Signed)
 Sheri, do you know if there is anything we can do about this at this point?

## 2024-07-14 ENCOUNTER — Telehealth: Payer: Self-pay

## 2024-07-14 NOTE — Telephone Encounter (Signed)
 I called patient after receiving fax from discharge and scheduled her an appointment for 07/22/23. Is this ok.

## 2024-07-15 ENCOUNTER — Telehealth: Payer: Self-pay | Admitting: Gastroenterology

## 2024-07-15 ENCOUNTER — Encounter: Payer: Self-pay | Admitting: *Deleted

## 2024-07-15 NOTE — Telephone Encounter (Signed)
 Spoke with patient. Appointment with Camie on 1/23 has been canceled. Patient to submit a stool specimen and will then call and reschedule appointment as she just had hip surgery.

## 2024-07-15 NOTE — Telephone Encounter (Signed)
 Good afternoon Margaret Norman,   Patient's daughter called requesting to know if patient is able to have 1/23 office visit changed to a virtual visit. States patient recently had hip replacement surgery and a virtual visit would be easier to complete. Please advise further on appointment.   Thank you

## 2024-07-16 ENCOUNTER — Encounter: Payer: Self-pay | Admitting: Orthopaedic Surgery

## 2024-07-16 NOTE — Telephone Encounter (Signed)
 I called and spoke with patient and notified that her hospital follow up from recent hip surgery is not needed and that she only needs to follow up with Ortho as this was a planned hip replacement.

## 2024-07-17 ENCOUNTER — Ambulatory Visit: Admitting: Gastroenterology

## 2024-07-21 ENCOUNTER — Inpatient Hospital Stay: Admitting: Nurse Practitioner

## 2024-07-23 ENCOUNTER — Other Ambulatory Visit (INDEPENDENT_AMBULATORY_CARE_PROVIDER_SITE_OTHER): Payer: Self-pay

## 2024-07-23 ENCOUNTER — Ambulatory Visit: Admitting: Physician Assistant

## 2024-07-23 DIAGNOSIS — Z96642 Presence of left artificial hip joint: Secondary | ICD-10-CM | POA: Diagnosis not present

## 2024-07-23 MED ORDER — HYDROCODONE-ACETAMINOPHEN 5-325 MG PO TABS: 1.0000 | ORAL_TABLET | Freq: Three times a day (TID) | ORAL | 0 refills | Status: AC | PRN

## 2024-07-23 NOTE — Progress Notes (Signed)
 "  Post-Op Visit Note   Patient: Margaret Norman           Date of Birth: 1950-09-03           MRN: [id number] Visit Date: 07/23/2024 PCP: Nedra Tinnie LABOR, NP   Assessment & Plan:  Chief Complaint:  Chief Complaint  Patient presents with   Left Hip - Routine Post Op    Conversion to left THA 07/10/2024   Visit Diagnoses:  1. History of left hip replacement     Plan: Patient is a pleasant 74 year old female who comes in today 2 weeks status post left hip conversion from an IM nail to a total hip replacement, date of surgery 07/10/2024.  She has been doing well with complaints of soreness relieved with Tylenol  and oxycodone .  She has been compliant taking a baby aspirin  twice daily for DVT prophylaxis.  She has been getting home health PT and is ambulating with a walker.  Examination of the left hip reveals well-healed surgical incisions with nylon sutures in place.  She does have a small to moderate seroma.  No evidence of infection or cellulitis.  Calves are soft nontender.  She is neurovascular intact distally.  Today, sutures removed and Steri-Strips applied.  She will continue with her baby aspirin  twice daily for another 4 weeks.  Have refilled her Norco.  She will alternate heat and ice over the seroma.  She may be full weightbearing to the operative extremity.  Follow-up in 4 weeks for repeat evaluation and x-rays of the left hip.  Call with concerns or questions.  Follow-Up Instructions: Return in about 4 weeks (around 08/20/2024).   Orders:  Orders Placed This Encounter  Procedures   XR HIP UNILAT W OR W/O PELVIS 2-3 VIEWS LEFT   Meds ordered this encounter  Medications   HYDROcodone -acetaminophen  (NORCO/VICODIN) 5-325 MG tablet    Sig: Take 1 tablet by mouth 3 (three) times daily as needed. To be taken after surgery    Dispense:  21 tablet    Refill:  0    Imaging: XR HIP UNILAT W OR W/O PELVIS 2-3 VIEWS LEFT Result Date: 07/23/2024 Well-seated prosthesis without  complication   PMFS History: Patient Active Problem List   Diagnosis Date Noted   Status post total replacement of left hip 07/10/2024   CHB (complete heart block) (HCC) 04/30/2024   Chronic systolic heart failure (HCC) 04/30/2024   Cataract of both eyes 04/30/2024   Primary osteoarthritis of left hip 04/14/2024   Nondisplaced intertrochanteric fracture of left femur, initial encounter for closed fracture (HCC) 11/24/2023   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   Port-A-Cath in place 06/10/2019   Second degree AV block    Malignant neoplasm of upper-outer quadrant of right breast in female, estrogen receptor positive (HCC) 03/25/2019   Thyromegaly 01/10/2018   Overweight (BMI 25.0-29.9) 08/13/2017   Type 2 diabetes mellitus (HCC) 03/19/2017   Thiamine  deficiency 01/04/2017   Tinnitus aurium, 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   Deficiency anemia 04/30/2016   Routine general medical examination at a health care facility 04/27/2015   Abdominal wall hernia 04/26/2015   Family history of hemochromatosis 09/13/2014   History of colonic polyps 03/10/2014   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   Lumbosacral spondylosis without myelopathy 05/12/2013   Insomnia, persistent 02/04/2013   Obesity (BMI 30-39.9) 02/04/2013   Asthma with exacerbation 09/22/2012   Visit for screening mammogram 07/04/2012   Nephrolithiasis    HTN (hypertension)    Gallstones    LBBB (left  bundle branch block)    Leukocytosis 12/20/2008   Hyperlipidemia with target LDL less than 100 09/04/2007   Chronic depression 09/04/2007   GERD 09/04/2007   Past Medical History:  Diagnosis Date   Allergy AMOXICILLAN   Anxiety    Arthritis    Breast cancer (HCC) 03/05/2019   Cataract    Chest pain    Nuclear, adenosine ,  December, 2013, low risk nuclear scan with small, moderate in intensity, fixed anteroseptal defect. This is possibly related to an LBBB versus small prior infarct. 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   Type II or unspecified type diabetes mellitus without mention of complication, not stated as uncontrolled     Family History  Problem Relation Age of Onset   Liver disease Mother    Dementia Mother    Diabetes Mother        borderline   Depression Mother    Coronary artery disease Father    Heart attack Father    Hypertension Father    Heart disease Father    Early death Father    Cancer Other        leukemia   Stroke Maternal Grandfather    Alcohol  abuse Maternal Grandfather    Hyperlipidemia Brother        Amyloidosis   Cancer Brother    Hypertension Brother    Diabetes Maternal Grandmother    Cancer Paternal Uncle        unknown   Heart attack Paternal Grandmother    Heart attack Paternal Uncle    Hypertension Brother    Diabetes Daughter        borderline   Diabetes Sister    Heart disease Sister    Kidney disease Sister    Heart disease Brother    Cancer Paternal Uncle    Colon cancer Neg Hx    Esophageal cancer Neg Hx    Rectal cancer Neg Hx    Stomach cancer Neg Hx     Past Surgical History:  Procedure Laterality Date   APPENDECTOMY     During C-Section Birth   BREAST BIOPSY Left 10/2017   BREAST BIOPSY Right 03/2019   BREAST BIOPSY Right 08/26/2023   US  RT BREAST BX W LOC DEV 1ST LESION IMG BX SPEC US  GUIDE 08/26/2023 [facility]   BREAST LUMPECTOMY Right 2020   BREAST LUMPECTOMY WITH RADIOACTIVE SEED AND SENTINEL LYMPH NODE BIOPSY Right 04/15/2019   Procedure: RIGHT BREAST PARTIAL MASTECTOMY WITH RADIOACTIVE SEED AND SENTINEL LYMPH NODE BIOPSY;  Surgeon: Vernetta Berg, MD;  Location: [facility];  Service: General;  Laterality: Right;   CATARACT  EXTRACTION Bilateral 06/01/2024   06/22/24   CESAREAN SECTION     x2  w/appy   CHOLECYSTECTOMY     CONVERSION TO TOTAL HIP Left 07/10/2024   Procedure: CONVERSION, PREVIOUS HIP SURGERY, TO TOTAL HIP ARTHROPLASTY;  Surgeon: Jerri Kay HERO, MD;  Location: [facility];  Service: Orthopedics;  Laterality: Left;  CONVERSION OF LEFT HIP SURGERY to LEFT TOTAL HIP ARTHROPLASTY   ESOPHAGOGASTRODUODENOSCOPY     FRACTURE SURGERY     HERNIA REPAIR  INTRAMEDULLARY (IM) NAIL INTERTROCHANTERIC Left 11/27/2023   Procedure: LEFT OPEN REDUCTION INTERNAL FIXATION (ORIF) INTERTROCHANTOR;  Surgeon: Jerri Kay HERO, MD;  Location: [facility];  Service: Orthopedics;  Laterality: Left;   KNEE ARTHROSCOPY Right 11/2005   LIVER BIOPSY     PANCREATIC CYST EXCISION     PORT-A-CATH REMOVAL Left 04/25/2020   Procedure: REMOVAL PORT-A-CATH;  Surgeon: Vernetta Berg, MD;  Location: [facility];  Service: General;  Laterality: Left;   PORTACATH PLACEMENT Left 04/15/2019   Procedure: INSERTION PORT-A-CATH WITH ULTRASOUND;  Surgeon: Vernetta Berg, MD;  Location: [facility];  Service: General;  Laterality: Left;   RIGHT/LEFT HEART CATH AND CORONARY ANGIOGRAPHY N/A 09/28/2019   Procedure: RIGHT/LEFT HEART CATH AND CORONARY ANGIOGRAPHY;  Surgeon: Rolan Ezra RAMAN, MD;  Location: [facility];  Service: Cardiovascular;  Laterality: N/A;   SPLENECTOMY     TONSILLECTOMY AND ADENOIDECTOMY     TUBAL LIGATION  06/24/78   Social History   Occupational History   Occupation: CSR    Employer: [employer]   Occupation: retired  Tobacco Use   Smoking status: Never   Smokeless tobacco: Never  Vaping Use   Vaping status: Never Used  Substance and Sexual Activity   Alcohol  use: Yes    Comment: very rarely a glass of wine   Drug use: No   Sexual activity: Not Currently    Birth control/protection: Post-menopausal    Comment: Tubes tied     "

## 2024-07-24 ENCOUNTER — Encounter: Admitting: Physician Assistant

## 2024-07-30 NOTE — Telephone Encounter (Signed)
 I called and spoke with patient regarding Hospital Follow up on 08/03/24 and patient has already had a follow up with ortho post surgery. Appointment for 08/03/24 canceled. Patient ok with this.

## 2024-08-03 ENCOUNTER — Inpatient Hospital Stay: Admitting: Nurse Practitioner

## 2024-08-20 ENCOUNTER — Encounter: Admitting: Physician Assistant

## 2024-08-28 ENCOUNTER — Ambulatory Visit: Admitting: Nurse Practitioner

## 2024-09-30 ENCOUNTER — Encounter (HOSPITAL_COMMUNITY)

## 2024-10-28 ENCOUNTER — Ambulatory Visit: Admitting: Internal Medicine

## 2024-12-22 ENCOUNTER — Ambulatory Visit

## 2025-05-11 ENCOUNTER — Inpatient Hospital Stay: Admitting: Hematology and Oncology
